# Patient Record
Sex: Female | Born: 1950 | Race: Black or African American | Hispanic: No | Smoking: Current every day smoker
Health system: Southern US, Community
[De-identification: ages and names within clinical notes are randomized; demographics above are authoritative.]

## PROBLEM LIST (undated history)

## (undated) DIAGNOSIS — Z951 Presence of aortocoronary bypass graft: Secondary | ICD-10-CM

## (undated) DIAGNOSIS — I213 ST elevation (STEMI) myocardial infarction of unspecified site: Secondary | ICD-10-CM

## (undated) DIAGNOSIS — Z720 Tobacco use: Secondary | ICD-10-CM

## (undated) DIAGNOSIS — E785 Hyperlipidemia, unspecified: Secondary | ICD-10-CM

## (undated) DIAGNOSIS — R569 Unspecified convulsions: Secondary | ICD-10-CM

## (undated) DIAGNOSIS — I10 Essential (primary) hypertension: Secondary | ICD-10-CM

## (undated) DIAGNOSIS — I639 Cerebral infarction, unspecified: Secondary | ICD-10-CM

## (undated) DIAGNOSIS — I63512 Cerebral infarction due to unspecified occlusion or stenosis of left middle cerebral artery: Secondary | ICD-10-CM

## (undated) HISTORY — PX: CARDIAC CATHETERIZATION: SHX172

## (undated) HISTORY — PX: TUBAL LIGATION: SHX77

## (undated) HISTORY — PX: DILATION AND CURETTAGE OF UTERUS: SHX78

## (undated) HISTORY — PX: EXCISION OF ABDOMINAL WALL TUMOR: SHX6687

---

## 2009-05-15 ENCOUNTER — Emergency Department: Payer: Self-pay | Admitting: Unknown Physician Specialty

## 2009-11-16 ENCOUNTER — Emergency Department: Payer: Self-pay | Admitting: Emergency Medicine

## 2010-01-12 ENCOUNTER — Emergency Department: Payer: Self-pay | Admitting: Emergency Medicine

## 2010-08-23 ENCOUNTER — Observation Stay: Payer: Self-pay | Admitting: Internal Medicine

## 2012-07-21 ENCOUNTER — Encounter (HOSPITAL_COMMUNITY)
Admission: EM | Disposition: A | Discharge status: Home Health | Payer: Self-pay | Source: Home / Self Care | Attending: Cardiovascular Disease

## 2012-07-21 ENCOUNTER — Inpatient Hospital Stay (HOSPITAL_COMMUNITY)
Admission: EM | Admit: 2012-07-21 | Discharge: 2012-07-31 | DRG: 234 | Disposition: A | Discharge status: Home Health | Payer: Medicaid Other | Attending: Cardiovascular Disease | Admitting: Cardiovascular Disease

## 2012-07-21 ENCOUNTER — Ambulatory Visit (HOSPITAL_COMMUNITY): Admit: 2012-07-21 | Payer: Self-pay | Admitting: Cardiovascular Disease

## 2012-07-21 ENCOUNTER — Encounter (HOSPITAL_COMMUNITY): Payer: Self-pay | Admitting: Emergency Medicine

## 2012-07-21 DIAGNOSIS — E785 Hyperlipidemia, unspecified: Secondary | ICD-10-CM | POA: Diagnosis present

## 2012-07-21 DIAGNOSIS — I219 Acute myocardial infarction, unspecified: Secondary | ICD-10-CM

## 2012-07-21 DIAGNOSIS — I10 Essential (primary) hypertension: Secondary | ICD-10-CM | POA: Diagnosis present

## 2012-07-21 DIAGNOSIS — I2510 Atherosclerotic heart disease of native coronary artery without angina pectoris: Secondary | ICD-10-CM

## 2012-07-21 DIAGNOSIS — I213 ST elevation (STEMI) myocardial infarction of unspecified site: Secondary | ICD-10-CM

## 2012-07-21 DIAGNOSIS — Z79899 Other long term (current) drug therapy: Secondary | ICD-10-CM

## 2012-07-21 DIAGNOSIS — J9819 Other pulmonary collapse: Secondary | ICD-10-CM | POA: Diagnosis not present

## 2012-07-21 DIAGNOSIS — Z951 Presence of aortocoronary bypass graft: Secondary | ICD-10-CM

## 2012-07-21 DIAGNOSIS — F17200 Nicotine dependence, unspecified, uncomplicated: Secondary | ICD-10-CM | POA: Diagnosis present

## 2012-07-21 DIAGNOSIS — I739 Peripheral vascular disease, unspecified: Secondary | ICD-10-CM | POA: Diagnosis present

## 2012-07-21 DIAGNOSIS — D696 Thrombocytopenia, unspecified: Secondary | ICD-10-CM | POA: Diagnosis not present

## 2012-07-21 DIAGNOSIS — E876 Hypokalemia: Secondary | ICD-10-CM | POA: Diagnosis present

## 2012-07-21 DIAGNOSIS — M810 Age-related osteoporosis without current pathological fracture: Secondary | ICD-10-CM | POA: Diagnosis present

## 2012-07-21 DIAGNOSIS — E8779 Other fluid overload: Secondary | ICD-10-CM | POA: Diagnosis not present

## 2012-07-21 DIAGNOSIS — I959 Hypotension, unspecified: Secondary | ICD-10-CM | POA: Diagnosis not present

## 2012-07-21 DIAGNOSIS — Z8673 Personal history of transient ischemic attack (TIA), and cerebral infarction without residual deficits: Secondary | ICD-10-CM

## 2012-07-21 DIAGNOSIS — Z8249 Family history of ischemic heart disease and other diseases of the circulatory system: Secondary | ICD-10-CM

## 2012-07-21 DIAGNOSIS — D62 Acute posthemorrhagic anemia: Secondary | ICD-10-CM | POA: Diagnosis not present

## 2012-07-21 DIAGNOSIS — Z720 Tobacco use: Secondary | ICD-10-CM | POA: Diagnosis present

## 2012-07-21 DIAGNOSIS — J90 Pleural effusion, not elsewhere classified: Secondary | ICD-10-CM | POA: Diagnosis not present

## 2012-07-21 HISTORY — DX: Presence of aortocoronary bypass graft: Z95.1

## 2012-07-21 HISTORY — DX: ST elevation (STEMI) myocardial infarction of unspecified site: I21.3

## 2012-07-21 HISTORY — DX: Essential (primary) hypertension: I10

## 2012-07-21 HISTORY — DX: Tobacco use: Z72.0

## 2012-07-21 HISTORY — DX: Hyperlipidemia, unspecified: E78.5

## 2012-07-21 HISTORY — PX: LEFT HEART CATHETERIZATION WITH CORONARY ANGIOGRAM: SHX5451

## 2012-07-21 LAB — BASIC METABOLIC PANEL
Chloride: 101 mEq/L (ref 96–112)
GFR calc Af Amer: 81 mL/min — ABNORMAL LOW (ref >90)
GFR calc non Af Amer: 70 mL/min — ABNORMAL LOW (ref >90)
Potassium: 3.1 mEq/L — ABNORMAL LOW (ref 3.5–5.1)
Sodium: 139 mEq/L (ref 135–145)

## 2012-07-21 LAB — DIFFERENTIAL
Lymphocytes Relative: 34 % (ref 12–46)
Lymphs Abs: 2.8 10*3/uL (ref 0.7–4.0)
Monocytes Relative: 7 % (ref 3–12)
Neutrophils Relative %: 57 % (ref 43–77)

## 2012-07-21 LAB — POCT I-STAT, CHEM 8
BUN: 14 mg/dL (ref 6–23)
Calcium, Ion: 1.24 mmol/L (ref 1.13–1.30)
Chloride: 103 mEq/L (ref 96–112)
HCT: 43 % (ref 36.0–46.0)
Potassium: 3.1 mEq/L — ABNORMAL LOW (ref 3.5–5.1)
Sodium: 143 mEq/L (ref 135–145)

## 2012-07-21 LAB — CBC
Hemoglobin: 14.3 g/dL (ref 12.0–15.0)
MCV: 93.9 fL (ref 78.0–100.0)
Platelets: 195 10*3/uL (ref 150–400)
RBC: 4.26 MIL/uL (ref 3.87–5.11)
WBC: 8.2 10*3/uL (ref 4.0–10.5)

## 2012-07-21 LAB — PROTIME-INR
INR: 0.96 (ref 0.00–1.49)
Prothrombin Time: 12.7 seconds (ref 11.6–15.2)

## 2012-07-21 LAB — POCT I-STAT TROPONIN I: Troponin i, poc: 0.01 ng/mL (ref 0.00–0.08)

## 2012-07-21 SURGERY — LEFT HEART CATHETERIZATION WITH CORONARY ANGIOGRAM
Anesthesia: LOCAL

## 2012-07-21 MED ORDER — ACETAMINOPHEN 325 MG PO TABS: 650.0000 mg | ORAL_TABLET | ORAL | Status: DC | PRN

## 2012-07-21 MED ORDER — LIDOCAINE HCL (PF) 1 % IJ SOLN
INTRAMUSCULAR | Status: AC
  Filled 2012-07-21: qty 30

## 2012-07-21 MED ORDER — HEPARIN BOLUS VIA INFUSION: 3000.0000 [IU] | Freq: Once | INTRAVENOUS | Status: AC

## 2012-07-21 MED ORDER — ONDANSETRON HCL 4 MG/2ML IJ SOLN: 4.0000 mg | Freq: Four times a day (QID) | INTRAMUSCULAR | Status: DC | PRN

## 2012-07-21 MED ORDER — NITROGLYCERIN IN D5W 200-5 MCG/ML-% IV SOLN
3.0000 ug/min | INTRAVENOUS | Status: DC
  Administered 2012-07-21: 10 ug/min via INTRAVENOUS

## 2012-07-21 MED ORDER — ASPIRIN 81 MG PO CHEW
81.0000 mg | CHEWABLE_TABLET | Freq: Every day | ORAL | Status: DC
  Administered 2012-07-22 – 2012-07-25 (×4): 81 mg via ORAL
  Filled 2012-07-21 (×4): qty 1

## 2012-07-21 MED ORDER — ATORVASTATIN CALCIUM 80 MG PO TABS
80.0000 mg | ORAL_TABLET | Freq: Every day | ORAL | Status: DC
  Administered 2012-07-21 – 2012-07-30 (×9): 80 mg via ORAL
  Filled 2012-07-21 (×11): qty 1

## 2012-07-21 MED ORDER — ASPIRIN EC 81 MG PO TBEC: 81.0000 mg | DELAYED_RELEASE_TABLET | Freq: Every day | ORAL | Status: DC

## 2012-07-21 MED ORDER — HEPARIN (PORCINE) IN NACL 2-0.9 UNIT/ML-% IJ SOLN
INTRAMUSCULAR | Status: AC
  Filled 2012-07-21: qty 1000

## 2012-07-21 MED ORDER — NITROGLYCERIN IN D5W 200-5 MCG/ML-% IV SOLN
INTRAVENOUS | Status: AC
  Filled 2012-07-21: qty 250

## 2012-07-21 MED ORDER — METOPROLOL TARTRATE 25 MG/10 ML ORAL SUSPENSION
6.2500 mg | Freq: Two times a day (BID) | ORAL | Status: DC
  Administered 2012-07-21: 6.25 mg via ORAL
  Filled 2012-07-21: qty 2.5

## 2012-07-21 MED ORDER — HYDRALAZINE HCL 20 MG/ML IJ SOLN
INTRAMUSCULAR | Status: AC
  Administered 2012-07-21: 10 mg via INTRAVENOUS
  Filled 2012-07-21: qty 1

## 2012-07-21 MED ORDER — METOPROLOL TARTRATE 1 MG/ML IV SOLN
INTRAVENOUS | Status: AC
  Filled 2012-07-21: qty 5

## 2012-07-21 MED ORDER — MIDAZOLAM HCL 2 MG/2ML IJ SOLN
INTRAMUSCULAR | Status: AC
  Filled 2012-07-21: qty 2

## 2012-07-21 MED ORDER — SODIUM CHLORIDE 0.9 % IV SOLN
INTRAVENOUS | Status: DC
  Administered 2012-07-21: 23:00:00 via INTRAVENOUS

## 2012-07-21 MED ORDER — HEPARIN SODIUM (PORCINE) 5000 UNIT/ML IJ SOLN
4000.0000 [IU] | Freq: Once | INTRAMUSCULAR | Status: AC
  Administered 2012-07-21: 3000 [IU] via INTRAVENOUS
  Filled 2012-07-21: qty 1

## 2012-07-21 MED ORDER — ACETAMINOPHEN 325 MG PO TABS
650.0000 mg | ORAL_TABLET | ORAL | Status: DC | PRN
  Administered 2012-07-24: 650 mg via ORAL
  Filled 2012-07-21: qty 2

## 2012-07-21 MED ORDER — EPTIFIBATIDE 75 MG/100ML IV SOLN
1.0000 ug/kg/min | INTRAVENOUS | Status: AC
  Administered 2012-07-22: 1 ug/kg/min via INTRAVENOUS
  Filled 2012-07-21 (×2): qty 100

## 2012-07-21 MED ORDER — CLONIDINE HCL 0.1 MG PO TABS
0.1000 mg | ORAL_TABLET | Freq: Once | ORAL | Status: AC
  Administered 2012-07-22: 0.1 mg via ORAL
  Filled 2012-07-21: qty 1

## 2012-07-21 MED ORDER — FENTANYL CITRATE 0.05 MG/ML IJ SOLN
INTRAMUSCULAR | Status: AC
  Filled 2012-07-21: qty 2

## 2012-07-21 MED ORDER — EPTIFIBATIDE BOLUS VIA INFUSION
180.0000 ug/kg | Freq: Once | INTRAVENOUS | Status: DC
  Filled 2012-07-21: qty 14

## 2012-07-21 MED ORDER — HYDRALAZINE HCL 20 MG/ML IJ SOLN: 10.0000 mg | Freq: Once | INTRAMUSCULAR | Status: AC

## 2012-07-21 MED ORDER — PNEUMOCOCCAL VAC POLYVALENT 25 MCG/0.5ML IJ INJ
0.5000 mL | INJECTION | INTRAMUSCULAR | Status: AC
  Administered 2012-07-22: 0.5 mL via INTRAMUSCULAR
  Filled 2012-07-21: qty 0.5

## 2012-07-21 MED ORDER — NITROGLYCERIN 0.2 MG/ML ON CALL CATH LAB
INTRAVENOUS | Status: AC
  Filled 2012-07-21: qty 1

## 2012-07-21 MED ORDER — ALPRAZOLAM 0.25 MG PO TABS
0.2500 mg | ORAL_TABLET | Freq: Once | ORAL | Status: AC
  Administered 2012-07-21: 0.25 mg via ORAL
  Filled 2012-07-21: qty 1

## 2012-07-21 MED ORDER — NITROGLYCERIN 0.4 MG SL SUBL: 0.4000 mg | SUBLINGUAL_TABLET | SUBLINGUAL | Status: DC | PRN

## 2012-07-21 MED ORDER — HEPARIN (PORCINE) IN NACL 100-0.45 UNIT/ML-% IJ SOLN
800.0000 [IU]/h | INTRAMUSCULAR | Status: DC
  Filled 2012-07-21: qty 250

## 2012-07-21 NOTE — ED Provider Notes (Signed)
History     CSN: [id number]  Arrival date & time 07/21/12  2046   First MD Initiated Contact with Patient 07/21/12 2055      Chief Complaint  Patient presents with  . Code STEMI    (Consider location/radiation/quality/duration/timing/severity/associated sxs/prior treatment) The history is provided by the patient and the EMS personnel.  pt here with substernal chest pain that began this pm--h/o same yesterday a/w exertion that resolved spontaneously--sx began again this pm when she ws cleaning--pt has sob and dizziness with this, ems called and pt with inf wall STEMI, nitro given and pt transported here--pt with 5/10 chest pain now  No past medical history on file.  No past surgical history on file.  No family history on file.  History  Substance Use Topics  . Smoking status: Not on file  . Smokeless tobacco: Not on file  . Alcohol Use: Not on file    OB History   No data available      Review of Systems  All other systems reviewed and are negative.    Allergies  Review of patient's allergies indicates not on file.  Home Medications  No current outpatient prescriptions on file.  There were no vitals taken for this visit.  Physical Exam  Nursing note and vitals reviewed. Constitutional: She is oriented to person, place, and time. She appears well-developed and well-nourished.  Non-toxic appearance. No distress.  HENT:  Head: Normocephalic and atraumatic.  Eyes: Conjunctivae, EOM and lids are normal. Pupils are equal, round, and reactive to light.  Neck: Normal range of motion. Neck supple. No tracheal deviation present. No mass present.  Cardiovascular: Normal rate, regular rhythm and normal heart sounds.  Exam reveals no gallop.   No murmur heard. Pulmonary/Chest: Effort normal and breath sounds normal. No stridor. No respiratory distress. She has no decreased breath sounds. She has no wheezes. She has no rhonchi. She has no rales.  Abdominal: Soft. Normal  appearance and bowel sounds are normal. She exhibits no distension. There is no tenderness. There is no rebound and no CVA tenderness.  Musculoskeletal: Normal range of motion. She exhibits no edema and no tenderness.  Neurological: She is alert and oriented to person, place, and time. She has normal strength. No cranial nerve deficit or sensory deficit. GCS eye subscore is 4. GCS verbal subscore is 5. GCS motor subscore is 6.  Skin: Skin is warm and dry. No abrasion and no rash noted.  Psychiatric: She has a normal mood and affect. Her speech is normal and behavior is normal.    ED Course  Procedures (including critical care time)  Labs Reviewed - No data to display No results found.   No diagnosis found.    MDM   Date: 07/21/2012  Rate: 70  Rhythm: normal sinus rhythm  QRS Axis: normal  Intervals: normal  ST/T Wave abnormalities: ST elevations inferiorly  Conduction Disutrbances:none  Narrative Interpretation:   Old EKG Reviewed: none available  Cards eval pt on arrival, heparing ordered along with morphine, she will go to cath lab         Toy Baker, MD 07/21/12 2101

## 2012-07-21 NOTE — H&P (Signed)
[name] [name] is an 62 y.o. female.   Chief Complaint: chest tightness/burning HPI: 62 yo woman with history of tobacco use who has been having exertional chest pain on/off for the last 2 days. With house chores, cleaning and moving around she noted pain that would improve with rest. Given continued pain EMS ultimately called and ECG suggested inferior STEMI with ST elevation and reciprocal changes in I, aVL, V2, V3. Ambulance activated cath lab/code STEMI who notified me. On arrival, she was having 5/10 chest pain, more burning in nature, mild improvement with SL NTG. bp 130s/70s in ambulance with drop to 110s, 60s with one SL NTG. HR in the 50s-60s. She received 4 baby aspirin and then 3000 units in the ER. She has some mild associated SOB, mild lightheadedness. Otherwise, no bleeding issues, dark stools and she states no problems with seeing a physician or taking medications regularly. We already discussed smoking cessation which she is amenable to doing.     Past Medical History  Diagnosis Date  . Hypertension     History reviewed. No pertinent past surgical history. No known family history of CAD No family history on file. Social History:  reports that she has been smoking.  She does not have any smokeless tobacco history on file. Her alcohol and drug histories are not on file.  Allergies: No Active Allergies   (Not in a hospital admission)  Results for orders placed during the hospital encounter of 07/21/12 (from the past 48 hour(s))  CBC     Status: None   Collection Time    07/21/12  8:57 PM      Result Value Range   WBC 8.2  4.0 - 10.5 K/uL   RBC 4.26  3.87 - 5.11 MIL/uL   Hemoglobin 14.3  12.0 - 15.0 g/dL   HCT 16.1  09.6 - 04.5 %   MCV 93.9  78.0 - 100.0 fL   MCH 33.6  26.0 - 34.0 pg   MCHC 35.8  30.0 - 36.0 g/dL   RDW 40.9  81.1 - 91.4 %   Platelets 195  150 - 400 K/uL  DIFFERENTIAL     Status: None   Collection Time    07/21/12  8:57 PM      Result Value Range   Neutrophils Relative % 57  43 - 77 %   Neutro Abs 4.7  1.7 - 7.7 K/uL   Lymphocytes Relative 34  12 - 46 %   Lymphs Abs 2.8  0.7 - 4.0 K/uL   Monocytes Relative 7  3 - 12 %   Monocytes Absolute 0.6  0.1 - 1.0 K/uL   Eosinophils Relative 2  0 - 5 %   Eosinophils Absolute 0.1  0.0 - 0.7 K/uL   Basophils Relative 1  0 - 1 %   Basophils Absolute 0.0  0.0 - 0.1 K/uL   No results found.  Review of Systems  Constitutional: Negative for fever, chills, weight loss and malaise/fatigue.  HENT: Negative for hearing loss, ear pain, neck pain and ear discharge.   Eyes: Negative for double vision, photophobia and pain.  Respiratory: Positive for shortness of breath. Negative for cough, hemoptysis and sputum production.   Cardiovascular: Positive for chest pain. Negative for orthopnea, claudication and leg swelling.  Gastrointestinal: Positive for heartburn. Negative for nausea, vomiting, abdominal pain, diarrhea, blood in stool and melena.  Genitourinary: Negative for dysuria, urgency and frequency.  Musculoskeletal: Negative for myalgias and back pain.  Neurological: Positive for dizziness.  Negative for tremors, sensory change and headaches.  Endo/Heme/Allergies: Negative for polydipsia. Does not bruise/bleed easily.  Psychiatric/Behavioral: Negative for depression, suicidal ideas and substance abuse.    Blood pressure 174/88, pulse 69, temperature 98.2 F (36.8 C), temperature source Oral, resp. rate 18, height 5\' 1"  (1.549 m), weight 56.7 kg (125 lb), SpO2 100.00%. Physical Exam  Nursing note and vitals reviewed. Constitutional: She is oriented to person, place, and time. She appears well-developed and well-nourished. She appears distressed.  Thin woman, appears stated age in mild distress with some ongoing burning chest symptoms/pain  HENT:  Head: Normocephalic and atraumatic.  Nose: Nose normal.  Mouth/Throat: Oropharynx is clear and moist. No oropharyngeal exudate.  Eyes: Conjunctivae and  EOM are normal. Pupils are equal, round, and reactive to light. Left eye exhibits no discharge. No scleral icterus.  Neck: Normal range of motion. Neck supple. No JVD present. No tracheal deviation present.  Cardiovascular: Normal rate, regular rhythm, normal heart sounds and intact distal pulses.  Exam reveals no gallop.   No murmur heard. Respiratory: Effort normal. No respiratory distress. She has no wheezes. She has rales.  GI: Soft. Bowel sounds are normal. She exhibits no distension. There is no tenderness. There is no rebound.  Musculoskeletal: Normal range of motion. She exhibits no edema and no tenderness.  Neurological: She is alert and oriented to person, place, and time. No cranial nerve deficit. Coordination normal.  Skin: Skin is warm and dry. No rash noted. No erythema.  Psychiatric: She has a normal mood and affect. Her behavior is normal. Thought content normal.   Labs pending at time of note ECG reviewed; inferior ST elevation, some qs, reciprocal ST depression in I, aVL, V2, V3 --> K noted to be 3.1 Problem List STEMI Chest Pain Tobacco abuse Hypertension Presumed dyslipidemia Hypokalemia Severe 3v CAD Assessment/Plan 62 yo woman with PMH of HTN tobacco abuse who has been having intermittent chest pain over the last 36-48 hours associated with exertion diagnosed with STEMI on ECG. LHC with severe 3v CAD - mLAD, D1, OM1, total LCx, 90% mRCA. Dr. Allyson Sabal called CT Surgery. Will plan to use IIB/IIIA, aspirin, betablocker while awaiting potential CABG. I discussed the findings with patient and her family.  - lipid panel, TSH, atorvastatin given - NPO  - hba1c, BNP - aspirin, bb - clonidine as it is home bp medication - replete potassium 40 meq PO, 40 meq IV KCl   [name] [name] 07/21/2012, 9:18 PM

## 2012-07-21 NOTE — CV Procedure (Signed)
[name] [name] is a 62 y.o. female    [id number] LOCATION:  FACILITY: [facility]  PHYSICIAN: Nanetta Batty, M.D. [birth year]   DATE OF PROCEDURE:  07/21/2012  DATE OF DISCHARGE:   CARDIAC CATHETERIZATION     History obtained from chart review.[name] [name] is a 62 year old thin-appearing tumor in female with no prior cardiac history who apparently has been having chest pain off and on for the last 2 days. She is brought in by EMS with chest pain and EKG the suggested an inferior ST EMI. She was brought emergently to the cath lab for cardiac catheterization and potential urgent revascularization.   PROCEDURE DESCRIPTION:    The patient was brought to the second floor [place] [facility] cath lab in the postabsorptive state. She was not premedicated .Marland Kitchen Her right groinwas prepped and shaved in usual sterile fashion. Xylocaine 1% was used for local anesthesia. A 5 French sheath was inserted into the right common femoral artery using standard Seldinger technique. The patient received  3000 units  of heparin  intravenously.  5 French right and left Judkins diagnostic catheters along with a 5 French pigtail catheter were used for selective coronary angiography and left ventriculography respectively. Visipaque dye was used for the entirety of the case. Retrograde aortic, left ventricular and pullback pressures were recorded. A total of 130 cc of contrast was administered in the patient.    HEMODYNAMICS:    AO SYSTOLIC/AO DIASTOLIC: 171/87   LV SYSTOLIC/LV DIASTOLIC: 182/35  ANGIOGRAPHIC RESULTS:   1. Left main; 30% distal  2. LAD; 60-70% segmental mid followed by 90% fairly focal just beyond this. The first diagonal branch was moderate in size and had an 80-90% proximal stenosis 3. Left circumflex; is a nondominant vessel is small in caliber. It was totally occluded in its midportion and this appeared to be chronic. Gave off a moderate-sized first marginal branch. There was a 90% AV groove circumflex  stenosis in the proximal third just before the takeoff of the marginal branch..  4. Right coronary artery; dominant, postop Stapley calcified with 95% stenosis on the first bend, and 90% stenosis on the distal bend but before the Genu , 70% stenosis at the crux, 95% ostial PDA stenosis. 5.LIMA with the subselectively visualized and widely patent. It was suitable for use during coronary artery bypass grafting if necessary 6. Left ventriculography; RAO left ventriculogram was performed using  25 mL of Visipaque dye at 12 mL/second. The overall LVEF estimated  45-50 %  With wall motion abnormalities notable for moderate to severe inferobasal hypokinesia and moderate posterolateral hypokinesia  IMPRESSION:[name] [name]  has three-vessel disease with mild LV dysfunction. Her circumflex is a small nondominant vessel and is occluded in its midportion. This appears chronic. She does have LAD and diagonal branch disease. I believe her culprit vessel is her dominant RCA which is patent with TIMI-3 flow. This is very tortuous and calcified and not ideal for percutaneous intervention. The patient is pain-free and hemodynamically stable. I believe the best therapy would be coronary artery bypass grafting for complete revascularization. Patient will be treated with  Aspirin,  beta blocker and IIb IIIa inhibitor (Integrillin). She has not been given oral antiplatelet medications. Dr. Andrey Spearman from [facility] as has been notified.  [name] MD, [facility] 07/21/2012 9:56 PM

## 2012-07-21 NOTE — Progress Notes (Signed)
ANTICOAGULATION CONSULT NOTE - Initial Consult  Pharmacy Consult for Heparin Indication: chest pain/ACS  No Active Allergies  Patient Measurements: Height: 5\' 1"  (154.9 cm) Weight: 125 lb (56.7 kg) IBW/kg (Calculated) : 47.8 Heparin Dosing Weight:   Vital Signs: Temp: 98.2 F (36.8 C) (06/20 2050) Temp src: Oral (06/20 2050) BP: 174/88 mmHg (06/20 2050) Pulse Rate: 69 (06/20 2050)  Labs: No results found for this basename: HGB, HCT, PLT, APTT, LABPROT, INR, HEPARINUNFRC, CREATININE, CKTOTAL, CKMB, TROPONINI,  in the last 72 hours  CrCl is unknown because no creatinine reading has been taken.   Medical History: No past medical history on file.  Medications:  Scheduled:    Assessment: Not much information available. Pt arrived as a Code STEMI.  We were asked to dose heparin before pt went to cath lab. Goal of Therapy:  Heparin level 0.3-0.7 units/ml Monitor platelets by anticoagulation protocol: Yes   Plan:  Heparin bolus 3000 Units Heparin drip 800 units/hr Will f/u after cath.  Eugene Garnet 07/21/2012,9:08 PM

## 2012-07-21 NOTE — ED Notes (Signed)
Pt to cath lab with Alwyn Pea, RN

## 2012-07-21 NOTE — H&P (Signed)
    Pt was reexamined and existing H & P reviewed. No changes found.  [name], MD [facility] 07/21/2012 9:48 PM

## 2012-07-21 NOTE — ED Notes (Signed)
Pt to ED via [facility]> Code Stemi> Pt was working inside cleaning house and became hot.  Pt reported near syncopal episode.  Reports tightness to R side of chest that radiates to R side of neck.  Denies sob, nausea, and vomiting.  EMS administered 4 baby ASA and NTG SL x 1. Initial BP 130/- and after NTG 109/-. Dr. Freida Busman (EDP) and Dr. Tresa Endo (cardiologist) at bedside on arrival.  Pt states she had same pain yesterday and reports it as feeling like heartburn.  Pain 5/10

## 2012-07-22 DIAGNOSIS — I219 Acute myocardial infarction, unspecified: Secondary | ICD-10-CM

## 2012-07-22 DIAGNOSIS — I2510 Atherosclerotic heart disease of native coronary artery without angina pectoris: Secondary | ICD-10-CM

## 2012-07-22 LAB — COMPREHENSIVE METABOLIC PANEL
ALT: 9 U/L (ref 0–35)
AST: 19 U/L (ref 0–37)
Albumin: 3.2 g/dL — ABNORMAL LOW (ref 3.5–5.2)
Alkaline Phosphatase: 65 U/L (ref 39–117)
BUN: 13 mg/dL (ref 6–23)
CO2: 23 mEq/L (ref 19–32)
Calcium: 9 mg/dL (ref 8.4–10.5)
Chloride: 103 mEq/L (ref 96–112)
Creatinine, Ser: 0.79 mg/dL (ref 0.50–1.10)
GFR calc Af Amer: >90 mL/min (ref >90)
GFR calc non Af Amer: 86 mL/min — ABNORMAL LOW (ref >90)
Glucose, Bld: 129 mg/dL — ABNORMAL HIGH (ref 70–99)
Potassium: 3.1 mEq/L — ABNORMAL LOW (ref 3.5–5.1)
Sodium: 138 mEq/L (ref 135–145)
Total Bilirubin: 0.3 mg/dL (ref 0.3–1.2)
Total Protein: 6.6 g/dL (ref 6.0–8.3)

## 2012-07-22 LAB — CBC WITH DIFFERENTIAL/PLATELET
Basophils Absolute: 0 10*3/uL (ref 0.0–0.1)
Basophils Relative: 0 % (ref 0–1)
Eosinophils Absolute: 0.1 10*3/uL (ref 0.0–0.7)
Eosinophils Relative: 1 % (ref 0–5)
HCT: 39.3 % (ref 36.0–46.0)
Hemoglobin: 13.9 g/dL (ref 12.0–15.0)
Lymphocytes Relative: 16 % (ref 12–46)
Lymphs Abs: 1.4 10*3/uL (ref 0.7–4.0)
MCH: 32.6 pg (ref 26.0–34.0)
MCHC: 35.4 g/dL (ref 30.0–36.0)
MCV: 92.3 fL (ref 78.0–100.0)
Monocytes Absolute: 0.5 10*3/uL (ref 0.1–1.0)
Monocytes Relative: 5 % (ref 3–12)
Neutro Abs: 6.9 10*3/uL (ref 1.7–7.7)
Neutrophils Relative %: 78 % — ABNORMAL HIGH (ref 43–77)
Platelets: 217 10*3/uL (ref 150–400)
RBC: 4.26 MIL/uL (ref 3.87–5.11)
RDW: 14 % (ref 11.5–15.5)
WBC: 8.8 10*3/uL (ref 4.0–10.5)

## 2012-07-22 LAB — PROTIME-INR
INR: 0.98 (ref 0.00–1.49)
Prothrombin Time: 12.9 seconds (ref 11.6–15.2)

## 2012-07-22 LAB — TROPONIN I
Troponin I: 1.51 ng/mL (ref <0.30)
Troponin I: 6.79 ng/mL (ref <0.30)

## 2012-07-22 LAB — CBC
HCT: 35.2 % — ABNORMAL LOW (ref 36.0–46.0)
Hemoglobin: 12.2 g/dL (ref 12.0–15.0)
MCH: 32.4 pg (ref 26.0–34.0)
MCHC: 34.7 g/dL (ref 30.0–36.0)
MCV: 93.4 fL (ref 78.0–100.0)
Platelets: 169 10*3/uL (ref 150–400)
RBC: 3.77 MIL/uL — ABNORMAL LOW (ref 3.87–5.11)
RDW: 14.3 % (ref 11.5–15.5)
WBC: 6.5 10*3/uL (ref 4.0–10.5)

## 2012-07-22 LAB — BASIC METABOLIC PANEL
BUN: 11 mg/dL (ref 6–23)
CO2: 22 mEq/L (ref 19–32)
Calcium: 8.7 mg/dL (ref 8.4–10.5)
Chloride: 109 mEq/L (ref 96–112)
Creatinine, Ser: 0.71 mg/dL (ref 0.50–1.10)
GFR calc Af Amer: >90 mL/min (ref >90)
GFR calc non Af Amer: 89 mL/min — ABNORMAL LOW (ref >90)
Glucose, Bld: 98 mg/dL (ref 70–99)
Potassium: 4.1 mEq/L (ref 3.5–5.1)
Sodium: 140 mEq/L (ref 135–145)

## 2012-07-22 LAB — MRSA PCR SCREENING: MRSA by PCR: NEGATIVE

## 2012-07-22 LAB — LIPID PANEL
Cholesterol: 187 mg/dL (ref 0–200)
HDL: 50 mg/dL (ref >39)
LDL Cholesterol: 120 mg/dL — ABNORMAL HIGH (ref 0–99)
Total CHOL/HDL Ratio: 3.7 RATIO
Triglycerides: 87 mg/dL (ref <150)
VLDL: 17 mg/dL (ref 0–40)

## 2012-07-22 MED ORDER — POTASSIUM CHLORIDE 10 MEQ/100ML IV SOLN
10.0000 meq | INTRAVENOUS | Status: AC
  Administered 2012-07-22 (×4): 10 meq via INTRAVENOUS
  Filled 2012-07-22: qty 400

## 2012-07-22 MED ORDER — SODIUM CHLORIDE 0.9 % IV SOLN: INTRAVENOUS | Status: DC

## 2012-07-22 MED ORDER — SODIUM CHLORIDE 0.9 % IV SOLN: INTRAVENOUS | Status: AC

## 2012-07-22 MED ORDER — ZOLPIDEM TARTRATE 5 MG PO TABS
5.0000 mg | ORAL_TABLET | Freq: Every evening | ORAL | Status: DC | PRN
  Administered 2012-07-22: 5 mg via ORAL
  Filled 2012-07-22: qty 1

## 2012-07-22 MED ORDER — POTASSIUM CHLORIDE CRYS ER 20 MEQ PO TBCR
40.0000 meq | EXTENDED_RELEASE_TABLET | Freq: Once | ORAL | Status: AC
  Administered 2012-07-22: 40 meq via ORAL
  Filled 2012-07-22 (×2): qty 1

## 2012-07-22 MED ORDER — CLONIDINE HCL 0.1 MG PO TABS
0.1000 mg | ORAL_TABLET | Freq: Two times a day (BID) | ORAL | Status: DC
  Administered 2012-07-22 – 2012-07-25 (×7): 0.1 mg via ORAL
  Filled 2012-07-22 (×8): qty 1

## 2012-07-22 MED ORDER — HEPARIN (PORCINE) IN NACL 100-0.45 UNIT/ML-% IJ SOLN
800.0000 [IU]/h | INTRAMUSCULAR | Status: DC
  Administered 2012-07-22: 650 [IU]/h via INTRAVENOUS
  Administered 2012-07-23 – 2012-07-25 (×2): 800 [IU]/h via INTRAVENOUS
  Filled 2012-07-22 (×5): qty 250

## 2012-07-22 MED ORDER — METOPROLOL TARTRATE 12.5 MG HALF TABLET
12.5000 mg | ORAL_TABLET | Freq: Two times a day (BID) | ORAL | Status: DC
  Administered 2012-07-22 (×2): 12.5 mg via ORAL
  Filled 2012-07-22 (×4): qty 1

## 2012-07-22 MED ORDER — ATROPINE SULFATE 1 MG/ML IJ SOLN
INTRAMUSCULAR | Status: AC
  Filled 2012-07-22: qty 1

## 2012-07-22 NOTE — Progress Notes (Signed)
CARDIAC REHAB PHASE I   PRE:  Rate/Rhythm: 70 sinus rhythm  BP:  Supine:   Sitting: 154/84  Standing:    SaO2: 99% ra  MODE:  Ambulation: 300 ft   POST:  Rate/Rhythem:   BP:  Supine:  Sitting:  152/83 Standing:    SaO2: 97% ra  1215-1255 Pt ambulated in hallway without difficulty x 1 assist. Asymptomatic.  Pt to chair, call light in reach.   Pre op education started.  Pt verbalized understanding  [name] [name], [employer]

## 2012-07-22 NOTE — Progress Notes (Signed)
Femoral Sheath Removal  Rt groin femoral sheath, level 0 pedal pulse 1+ prior to removal. Manual pressure held x11mins, VSS throughout pull, pt tolerated well. No complications. Pressure dressing applied to site. Post removal Rt groin level 0, VSS, pedal pulse 1+. Post instructions given to patient and family at bedside.

## 2012-07-22 NOTE — Progress Notes (Signed)
ANTICOAGULATION CONSULT NOTE - Follow-up Consult  Pharmacy Consult for Integrilin Indication: chest pain/ACS; CAD  No Known Allergies  Patient Measurements: Height: 5\' 1"  (154.9 cm) Weight: 111 lb 12.4 oz (50.7 kg) IBW/kg (Calculated) : 47.8 Heparin Dosing Weight:   Vital Signs: Temp: 98.4 F (36.9 C) (06/21 0807) Temp src: Oral (06/21 0807) BP: 153/99 mmHg (06/21 0807) Pulse Rate: 65 (06/21 0807)  Labs:  Recent Labs  07/21/12 2057 07/21/12 2125 07/21/12 2337 07/22/12 0730  HGB 14.3 14.6 13.9 12.2  HCT 40.0 43.0 39.3 35.2*  PLT 195  --  217 169  APTT 26  --  64*  --   LABPROT 12.7  --  12.9  --   INR 0.96  --  0.98  --   CREATININE 0.86 1.10 0.79 0.71  TROPONINI  --   --  1.51* 6.80*    Estimated Creatinine Clearance: 53.6 ml/min (by C-G formula based on Cr of 0.71).   Medical History: Past Medical History  Diagnosis Date  . Hypertension     Medications:  Scheduled:  . aspirin  81 mg Oral Daily  . atorvastatin  80 mg Oral q1800  . cloNIDine  0.1 mg Oral BID  . eptifibatide  180 mcg/kg Intravenous Once  . metoprolol tartrate  12.5 mg Oral BID  . pneumococcal 23 valent vaccine  0.5 mL Intramuscular Tomorrow-1000   . eptifibatide 1 mcg/kg/min (07/22/12 0435)  . nitroGLYCERIN 20 mcg/min (07/22/12 0200)   Assessment:   Pt arrived as a Code STEMI.  We were asked to dose heparin before pt went to cath lab.  Heparin was turned off after cath, and Integrelin was to be continued x 18 hrs.  at 1 mcg/kg/min.  Platelet count fairly stable on Integrilin.  Noted plans for possible CABG.  Goal of Therapy:  Monitor platelets by anticoagulation protocol: Yes   Plan: Integrilin to be d/c'd at 1600 PM today per MD orders. Will need VTE prophylaxis once Integrilin off.  Tad Moore, BCPS  Clinical Pharmacist Pager [phone]  07/22/2012 9:07 AM

## 2012-07-22 NOTE — Progress Notes (Signed)
Cardiac catheter sheath removal note: Right groin arterial sheath removed at 0056, site WNL prior to pull. Manual pressure applied for total of 30 minutes. Patient tolerated procedure well, no complaints. Post sheath removal site WNL, level 0.  Post sheath removal instructions given, patient stated understanding, distant pulse present, pressure dressing applied. Montine Circle, RN

## 2012-07-22 NOTE — Progress Notes (Signed)
ANTICOAGULATION CONSULT NOTE - Follow-up Consult  Pharmacy Consult for Heparin Indication: chest pain/ACS; CAD  No Known Allergies  Patient Measurements: Height: 5\' 1"  (154.9 cm) Weight: 111 lb 12.4 oz (50.7 kg) IBW/kg (Calculated) : 47.8 Heparin Dosing Weight:   Vital Signs: Temp: 97.6 F (36.4 C) (06/21 1200) Temp src: Oral (06/21 1200) BP: 139/69 mmHg (06/21 1600) Pulse Rate: 75 (06/21 1117)  Labs:  Recent Labs  07/21/12 2057 07/21/12 2125 07/21/12 2337 07/22/12 0730 07/22/12 1134  HGB 14.3 14.6 13.9 12.2  --   HCT 40.0 43.0 39.3 35.2*  --   PLT 195  --  217 169  --   APTT 26  --  64*  --   --   LABPROT 12.7  --  12.9  --   --   INR 0.96  --  0.98  --   --   CREATININE 0.86 1.10 0.79 0.71  --   TROPONINI  --   --  1.51* 6.80* 6.79*    Estimated Creatinine Clearance: 53.6 ml/min (by C-G formula based on Cr of 0.71).   Medical History: Past Medical History  Diagnosis Date  . Hypertension     Medications:  Scheduled:  . aspirin  81 mg Oral Daily  . atorvastatin  80 mg Oral q1800  . cloNIDine  0.1 mg Oral BID  . eptifibatide  180 mcg/kg Intravenous Once  . metoprolol tartrate  12.5 mg Oral BID   . sodium chloride 10 mL/hr at 07/22/12 0700  . nitroGLYCERIN Stopped (07/22/12 1400)   Assessment:   Pt arrived as a Code STEMI.  We were asked to dose heparin before pt went to cath lab.  Heparin was turned off after cath, and Integrelin was to be continued x 18 hrs at 1 mcg/kg/min which ended at 1630 6/21. Platelet count fairly stable on Integrilin.  Noted plans for possible CABG.  Now that integrilin is off plan is to start heparin until CABG.  Goal of Therapy:  Monitor platelets by anticoagulation protocol: Yes  HL 0.3-0.7 Plan: Heparin drip 650 uts/hr Check 6hr HL   Daily HL, CBC  Leota Sauers Pharm.D. CPP, BCPS Clinical Pharmacist [phone] 07/22/2012 5:31 PM

## 2012-07-22 NOTE — Consult Note (Signed)
Reason for Consult:3 vessel CAD, post STEMI Referring Physician: Dr. Berry  [name] [name] is an 62 y.o. female.  HPI: 62 yo woman presents with a cc/o CP  She is a 62 yo woman with a history of hypertension, tobacco abuse and a + family history of CAD(brother died of Mi on his 40s). She began having Cp with exertion 2 days prior to admission. On the day of admission she was having frequent episodes and then the pain became constant. Also mild SOB and nausea. She called EMS and was brought to the ED. She was a code STEMI with ongoing CP on arrival. Medical therapy initiated and taken emergently to cath lab. Cath revealed severe 3 vessel CAD. She had inferior hypokinesis as well as mild MR on LV gram. Her pain resolved and she was started on integrelin.  She denies any CP prior to the recent events. However, she did state that she thinks she had a stroke in February of this year. She did not seek medical attention at the time. She says she had some right UE weakness and loss of coordination and that her speech changed. She says these issues have improved since that time. She does not have any problems ambulating.  Past Medical History  Diagnosis Date  . Hypertension   Tobacco abuse Possible stroke in 2/14  History reviewed. No pertinent past surgical history.  History reviewed. No pertinent family history.  Social History:  reports that she has been smoking Cigarettes.  She has been smoking about 0.25 packs per day. She does not have any smokeless tobacco history on file. She reports that she does not drink alcohol or use illicit drugs.  Allergies: No Known Allergies  Medications:  Prior to Admission:  Prescriptions prior to admission  Medication Sig Dispense Refill  . cloNIDine (CATAPRES) 0.1 MG tablet Take 0.1 mg by mouth 2 (two) times daily.      . metoprolol (LOPRESSOR) 50 MG tablet Take 50 mg by mouth 2 (two) times daily.        Results for orders placed during the hospital  encounter of 07/21/12 (from the past 48 hour(s))  CBC     Status: None   Collection Time    07/21/12  8:57 PM      Result Value Range   WBC 8.2  4.0 - 10.5 K/uL   RBC 4.26  3.87 - 5.11 MIL/uL   Hemoglobin 14.3  12.0 - 15.0 g/dL   HCT 40.0  36.0 - 46.0 %   MCV 93.9  78.0 - 100.0 fL   MCH 33.6  26.0 - 34.0 pg   MCHC 35.8  30.0 - 36.0 g/dL   RDW 14.2  11.5 - 15.5 %   Platelets 195  150 - 400 K/uL  DIFFERENTIAL     Status: None   Collection Time    07/21/12  8:57 PM      Result Value Range   Neutrophils Relative % 57  43 - 77 %   Neutro Abs 4.7  1.7 - 7.7 K/uL   Lymphocytes Relative 34  12 - 46 %   Lymphs Abs 2.8  0.7 - 4.0 K/uL   Monocytes Relative 7  3 - 12 %   Monocytes Absolute 0.6  0.1 - 1.0 K/uL   Eosinophils Relative 2  0 - 5 %   Eosinophils Absolute 0.1  0.0 - 0.7 K/uL   Basophils Relative 1  0 - 1 %   Basophils Absolute 0.0  0.0 -   0.1 K/uL  PROTIME-INR     Status: None   Collection Time    07/21/12  8:57 PM      Result Value Range   Prothrombin Time 12.7  11.6 - 15.2 seconds   INR 0.96  0.00 - 1.49  APTT     Status: None   Collection Time    07/21/12  8:57 PM      Result Value Range   aPTT 26  24 - 37 seconds  BASIC METABOLIC PANEL     Status: Abnormal   Collection Time    07/21/12  8:57 PM      Result Value Range   Sodium 139  135 - 145 mEq/L   Potassium 3.1 (*) 3.5 - 5.1 mEq/L   Chloride 101  96 - 112 mEq/L   CO2 27  19 - 32 mEq/L   Glucose, Bld 107 (*) 70 - 99 mg/dL   BUN 15  6 - 23 mg/dL   Creatinine, Ser 0.86  0.50 - 1.10 mg/dL   Calcium 9.8  8.4 - 10.5 mg/dL   GFR calc non Af Amer 70 (*) >90 mL/min   GFR calc Af Amer 81 (*) >90 mL/min   Comment:            The eGFR has been calculated     using the CKD EPI equation.     This calculation has not been     validated in all clinical     situations.     eGFR's persistently     <90 mL/min signify     possible Chronic Kidney Disease.  POCT I-STAT TROPONIN I     Status: None   Collection Time     07/21/12  9:24 PM      Result Value Range   Troponin i, poc 0.01  0.00 - 0.08 ng/mL   Comment 3            Comment: Due to the release kinetics of cTnI,     a negative result within the first hours     of the onset of symptoms does not rule out     myocardial infarction with certainty.     If myocardial infarction is still suspected,     repeat the test at appropriate intervals.  POCT I-STAT, CHEM 8     Status: Abnormal   Collection Time    07/21/12  9:25 PM      Result Value Range   Sodium 143  135 - 145 mEq/L   Potassium 3.1 (*) 3.5 - 5.1 mEq/L   Chloride 103  96 - 112 mEq/L   BUN 14  6 - 23 mg/dL   Creatinine, Ser 1.10  0.50 - 1.10 mg/dL   Glucose, Bld 104 (*) 70 - 99 mg/dL   Calcium, Ion 1.24  1.13 - 1.30 mmol/L   TCO2 28  0 - 100 mmol/L   Hemoglobin 14.6  12.0 - 15.0 g/dL   HCT 43.0  36.0 - 46.0 %  MRSA PCR SCREENING     Status: None   Collection Time    07/21/12 10:20 PM      Result Value Range   MRSA by PCR NEGATIVE  NEGATIVE   Comment:            The GeneXpert MRSA Assay (FDA     approved for NASAL specimens     only), is one component of a     comprehensive MRSA colonization       surveillance program. It is not     intended to diagnose MRSA     infection nor to guide or     monitor treatment for     MRSA infections.  TROPONIN I     Status: Abnormal   Collection Time    07/21/12 11:37 PM      Result Value Range   Troponin I 1.51 (*) <0.30 ng/mL   Comment:            Due to the release kinetics of cTnI,     a negative result within the first hours     of the onset of symptoms does not rule out     myocardial infarction with certainty.     If myocardial infarction is still suspected,     repeat the test at appropriate intervals.     CRITICAL RESULT CALLED TO, READ BACK BY AND VERIFIED WITH:     S STOWE,RN [id number] [facility]  PROTIME-INR     Status: None   Collection Time    07/21/12 11:37 PM      Result Value Range   Prothrombin Time 12.9  11.6 - 15.2  seconds   INR 0.98  0.00 - 1.49  APTT     Status: Abnormal   Collection Time    07/21/12 11:37 PM      Result Value Range   aPTT 64 (*) 24 - 37 seconds   Comment:            IF BASELINE aPTT IS ELEVATED,     SUGGEST PATIENT RISK ASSESSMENT     BE USED TO DETERMINE APPROPRIATE     ANTICOAGULANT THERAPY.  CBC WITH DIFFERENTIAL     Status: Abnormal   Collection Time    07/21/12 11:37 PM      Result Value Range   WBC 8.8  4.0 - 10.5 K/uL   RBC 4.26  3.87 - 5.11 MIL/uL   Hemoglobin 13.9  12.0 - 15.0 g/dL   HCT 39.3  36.0 - 46.0 %   MCV 92.3  78.0 - 100.0 fL   MCH 32.6  26.0 - 34.0 pg   MCHC 35.4  30.0 - 36.0 g/dL   RDW 14.0  11.5 - 15.5 %   Platelets 217  150 - 400 K/uL   Neutrophils Relative % 78 (*) 43 - 77 %   Neutro Abs 6.9  1.7 - 7.7 K/uL   Lymphocytes Relative 16  12 - 46 %   Lymphs Abs 1.4  0.7 - 4.0 K/uL   Monocytes Relative 5  3 - 12 %   Monocytes Absolute 0.5  0.1 - 1.0 K/uL   Eosinophils Relative 1  0 - 5 %   Eosinophils Absolute 0.1  0.0 - 0.7 K/uL   Basophils Relative 0  0 - 1 %   Basophils Absolute 0.0  0.0 - 0.1 K/uL  COMPREHENSIVE METABOLIC PANEL     Status: Abnormal   Collection Time    07/21/12 11:37 PM      Result Value Range   Sodium 138  135 - 145 mEq/L   Potassium 3.1 (*) 3.5 - 5.1 mEq/L   Chloride 103  96 - 112 mEq/L   CO2 23  19 - 32 mEq/L   Glucose, Bld 129 (*) 70 - 99 mg/dL   BUN 13  6 - 23 mg/dL   Creatinine, Ser 0.79  0.50 - 1.10 mg/dL   Calcium 9.0  8.4 - 10.5 mg/dL     Total Protein 6.6  6.0 - 8.3 g/dL   Albumin 3.2 (*) 3.5 - 5.2 g/dL   AST 19  0 - 37 U/L   ALT 9  0 - 35 U/L   Alkaline Phosphatase 65  39 - 117 U/L   Total Bilirubin 0.3  0.3 - 1.2 mg/dL   GFR calc non Af Amer 86 (*) >90 mL/min   GFR calc Af Amer >90  >90 mL/min   Comment:            The eGFR has been calculated     using the CKD EPI equation.     This calculation has not been     validated in all clinical     situations.     eGFR's persistently     <90 mL/min signify      possible Chronic Kidney Disease.  PRO B NATRIURETIC PEPTIDE     Status: Abnormal   Collection Time    07/21/12 11:37 PM      Result Value Range   Pro B Natriuretic peptide (BNP) 1358.0 (*) 0 - 125 pg/mL  TROPONIN I     Status: Abnormal   Collection Time    07/22/12  7:30 AM      Result Value Range   Troponin I 6.80 (*) <0.30 ng/mL   Comment:            Due to the release kinetics of cTnI,     a negative result within the first hours     of the onset of symptoms does not rule out     myocardial infarction with certainty.     If myocardial infarction is still suspected,     repeat the test at appropriate intervals.     CRITICAL VALUE NOTED.  VALUE IS CONSISTENT WITH PREVIOUSLY REPORTED AND CALLED VALUE.  LIPID PANEL     Status: Abnormal   Collection Time    07/22/12  7:30 AM      Result Value Range   Cholesterol 187  0 - 200 mg/dL   Triglycerides 87  <150 mg/dL   HDL 50  >39 mg/dL   Total CHOL/HDL Ratio 3.7     VLDL 17  0 - 40 mg/dL   LDL Cholesterol 120 (*) 0 - 99 mg/dL   Comment:            Total Cholesterol/HDL:CHD Risk     Coronary Heart Disease Risk Table                         Men   Women      1/2 Average Risk   3.4   3.3      Average Risk       5.0   4.4      2 X Average Risk   9.6   7.1      3 X Average Risk  23.4   11.0                Use the calculated Patient Ratio     above and the CHD Risk Table     to determine the patient's CHD Risk.                ATP III CLASSIFICATION (LDL):      <100     mg/dL   Optimal      100-129  mg/dL   Near or Above                          Optimal      130-159  mg/dL   Borderline      160-189  mg/dL   High      >190     mg/dL   Very High  BASIC METABOLIC PANEL     Status: Abnormal   Collection Time    07/22/12  7:30 AM      Result Value Range   Sodium 140  135 - 145 mEq/L   Potassium 4.1  3.5 - 5.1 mEq/L   Chloride 109  96 - 112 mEq/L   CO2 22  19 - 32 mEq/L   Glucose, Bld 98  70 - 99 mg/dL   BUN 11  6 - 23 mg/dL    Creatinine, Ser 0.71  0.50 - 1.10 mg/dL   Calcium 8.7  8.4 - 10.5 mg/dL   GFR calc non Af Amer 89 (*) >90 mL/min   GFR calc Af Amer >90  >90 mL/min   Comment:            The eGFR has been calculated     using the CKD EPI equation.     This calculation has not been     validated in all clinical     situations.     eGFR's persistently     <90 mL/min signify     possible Chronic Kidney Disease.  CBC     Status: Abnormal   Collection Time    07/22/12  7:30 AM      Result Value Range   WBC 6.5  4.0 - 10.5 K/uL   RBC 3.77 (*) 3.87 - 5.11 MIL/uL   Hemoglobin 12.2  12.0 - 15.0 g/dL   HCT 35.2 (*) 36.0 - 46.0 %   MCV 93.4  78.0 - 100.0 fL   MCH 32.4  26.0 - 34.0 pg   MCHC 34.7  30.0 - 36.0 g/dL   RDW 14.3  11.5 - 15.5 %   Platelets 169  150 - 400 K/uL    No results found.  Review of Systems  Constitutional: Negative for fever and chills.  Respiratory: Positive for shortness of breath. Negative for wheezing.   Cardiovascular: Positive for chest pain and claudication (pain right leg when walking- "poor circulation"). Negative for orthopnea.  Genitourinary: Negative.   Neurological: Positive for speech change (in Feb- has improved) and focal weakness (right side in Feb- improved since then).  All other systems reviewed and are negative.   Blood pressure 165/84, pulse 75, temperature 98.4 F (36.9 C), temperature source Oral, resp. rate 21, height 5' 1" (1.549 m), weight 111 lb 12.4 oz (50.7 kg), SpO2 98.00%. Physical Exam  Vitals reviewed. Constitutional: She is oriented to person, place, and time. No distress.  thin  HENT:  Head: Normocephalic and atraumatic.  Eyes: EOM are normal. Pupils are equal, round, and reactive to light.  Neck: Neck supple. No thyromegaly present.  No audible bruit  Cardiovascular: Normal rate, regular rhythm and normal heart sounds.  Exam reveals no gallop and no friction rub.   No murmur heard. Respiratory: She has wheezes. She has no rales.   Coarse BS bilaterally  GI: Soft. There is no tenderness.  Musculoskeletal: She exhibits no edema.  Lymphadenopathy:    She has no cervical adenopathy.  Neurological: She is alert and oriented to person, place, and time.  RUE 4+/5, LUE 5/5  Skin: Skin is warm and dry.   HEMODYNAMICS:  AO SYSTOLIC/AO DIASTOLIC: 171/87  LV SYSTOLIC/LV DIASTOLIC: 182/35  ANGIOGRAPHIC   RESULTS:  1. Left main; 30% distal  2. LAD; 60-70% segmental mid followed by 90% fairly focal just beyond this. The first diagonal branch was moderate in size and had an 80-90% proximal stenosis  3. Left circumflex; is a nondominant vessel is small in caliber. It was totally occluded in its midportion and this appeared to be chronic. Gave off a moderate-sized first marginal branch. There was a 90% AV groove circumflex stenosis in the proximal third just before the takeoff of the marginal branch..  4. Right coronary artery; dominant, postop Stapley calcified with 95% stenosis on the first bend, and 90% stenosis on the distal bend but before the Genu , 70% stenosis at the crux, 95% ostial PDA stenosis.  5.LIMA with the subselectively visualized and widely patent. It was suitable for use during coronary artery bypass grafting if necessary  6. Left ventriculography; RAO left ventriculogram was performed using  25 mL of Visipaque dye at 12 mL/second. The overall LVEF estimated  45-50 % With wall motion abnormalities notable for moderate to severe inferobasal hypokinesia and moderate posterolateral hypokinesia  IMPRESSION:[name] [name] has three-vessel disease with mild LV dysfunction. Her circumflex is a small nondominant vessel and is occluded in its midportion. This appears chronic. She does have LAD and diagonal branch disease. I believe her culprit vessel is her dominant RCA which is patent with TIMI-3 flow. This is very tortuous and calcified and not ideal for percutaneous intervention. The patient is pain-free and hemodynamically  stable. I believe the best therapy would be coronary artery bypass grafting for complete revascularization. Patient will be treated with Aspirin, beta blocker and IIb IIIa inhibitor (Integrillin). She has not been given oral antiplatelet medications. Dr. Steve Hendrickson from [facility] as has been notified.     Assessment/Plan: 62 yo woman with multiple CRF presented with a STEMi which aborted. She is currently pain free.  She has severe 3 vessel CAD and needs CABG for survival benefit and relief of symptoms.  The primary concern is her history of a possible stroke in February of this year. Her history is convincing but she did not seek out a medical evaluation at that time. With that in mind it would probably be best to have her carotids evaluated prior to CABG. That is assuming she remains stable. If she develops unstable CP again she would need to go for urgent revascularization regardless.  I discussed with her the general nature of CABG, the need for general anesthesia, and the incisions to be used. I discussed the expected hospital stay, overall recovery and short and long term outcomes. We discussed the indications, risks, benefits and alternatives. She understands the risks include but are not limited to death, stroke, MI, DVT/PE, bleeding, possible need for transfusion, infections, cardiac arrhythmias, and other organ system dysfunction including respiratory, renal, or GI complications. She accepts these risks and agrees to proceed.  HENDRICKSON,STEVEN C 07/22/2012, 11:26 AM      

## 2012-07-22 NOTE — Progress Notes (Signed)
Subjective:  No CP/SOB  Objective:  Temp:  [97.8 F (36.6 C)-98.6 F (37 C)] 97.8 F (36.6 C) (06/21 0400) Pulse Rate:  [69-79] 79 (06/20 2113) Resp:  [17-28] 18 (06/21 0400) BP: (109-205)/(60-90) 115/71 mmHg (06/21 0400) SpO2:  [98 %-100 %] 99 % (06/21 0400) Arterial Line BP: (138-228)/(70-113) 172/84 mmHg (06/21 0045) Weight:  [111 lb 12.4 oz (50.7 kg)-125 lb (56.7 kg)] 111 lb 12.4 oz (50.7 kg) (06/20 2321) Weight change:   Intake/Output from previous day: 06/20 0701 - 06/21 0700 In: 1029.5 [P.O.:120; I.V.:509.5; IV Piggyback:400] Out: 800 [Urine:800]  Intake/Output from this shift: Total I/O In: 1029.5 [P.O.:120; I.V.:509.5; IV Piggyback:400] Out: 800 [Urine:800]  Physical Exam: General appearance: alert and no distress Neck: no adenopathy, no carotid bruit, no JVD, supple, symmetrical, trachea midline and thyroid not enlarged, symmetric, no tenderness/mass/nodules Lungs: clear to auscultation bilaterally Heart: regular rate and rhythm, S1, S2 normal, no murmur, click, rub or gallop Extremities: extremities normal, atraumatic, no cyanosis or edema and Right groin OK  Lab Results: Results for orders placed during the hospital encounter of 07/21/12 (from the past 48 hour(s))  CBC     Status: None   Collection Time    07/21/12  8:57 PM      Result Value Range   WBC 8.2  4.0 - 10.5 K/uL   RBC 4.26  3.87 - 5.11 MIL/uL   Hemoglobin 14.3  12.0 - 15.0 g/dL   HCT 60.4  54.0 - 98.1 %   MCV 93.9  78.0 - 100.0 fL   MCH 33.6  26.0 - 34.0 pg   MCHC 35.8  30.0 - 36.0 g/dL   RDW 19.1  47.8 - 29.5 %   Platelets 195  150 - 400 K/uL  DIFFERENTIAL     Status: None   Collection Time    07/21/12  8:57 PM      Result Value Range   Neutrophils Relative % 57  43 - 77 %   Neutro Abs 4.7  1.7 - 7.7 K/uL   Lymphocytes Relative 34  12 - 46 %   Lymphs Abs 2.8  0.7 - 4.0 K/uL   Monocytes Relative 7  3 - 12 %   Monocytes Absolute 0.6  0.1 - 1.0 K/uL   Eosinophils Relative 2  0 - 5 %    Eosinophils Absolute 0.1  0.0 - 0.7 K/uL   Basophils Relative 1  0 - 1 %   Basophils Absolute 0.0  0.0 - 0.1 K/uL  PROTIME-INR     Status: None   Collection Time    07/21/12  8:57 PM      Result Value Range   Prothrombin Time 12.7  11.6 - 15.2 seconds   INR 0.96  0.00 - 1.49  APTT     Status: None   Collection Time    07/21/12  8:57 PM      Result Value Range   aPTT 26  24 - 37 seconds  BASIC METABOLIC PANEL     Status: Abnormal   Collection Time    07/21/12  8:57 PM      Result Value Range   Sodium 139  135 - 145 mEq/L   Potassium 3.1 (*) 3.5 - 5.1 mEq/L   Chloride 101  96 - 112 mEq/L   CO2 27  19 - 32 mEq/L   Glucose, Bld 107 (*) 70 - 99 mg/dL   BUN 15  6 - 23 mg/dL   Creatinine, Ser 6.21  0.50 - 1.10 mg/dL   Calcium 9.8  8.4 - 96.0 mg/dL   GFR calc non Af Amer 70 (*) >90 mL/min   GFR calc Af Amer 81 (*) >90 mL/min   Comment:            The eGFR has been calculated     using the CKD EPI equation.     This calculation has not been     validated in all clinical     situations.     eGFR's persistently     <90 mL/min signify     possible Chronic Kidney Disease.  POCT I-STAT TROPONIN I     Status: None   Collection Time    07/21/12  9:24 PM      Result Value Range   Troponin i, poc 0.01  0.00 - 0.08 ng/mL   Comment 3            Comment: Due to the release kinetics of cTnI,     a negative result within the first hours     of the onset of symptoms does not rule out     myocardial infarction with certainty.     If myocardial infarction is still suspected,     repeat the test at appropriate intervals.  POCT I-STAT, CHEM 8     Status: Abnormal   Collection Time    07/21/12  9:25 PM      Result Value Range   Sodium 143  135 - 145 mEq/L   Potassium 3.1 (*) 3.5 - 5.1 mEq/L   Chloride 103  96 - 112 mEq/L   BUN 14  6 - 23 mg/dL   Creatinine, Ser 4.54  0.50 - 1.10 mg/dL   Glucose, Bld 098 (*) 70 - 99 mg/dL   Calcium, Ion 1.19  1.47 - 1.30 mmol/L   TCO2 28  0 - 100  mmol/L   Hemoglobin 14.6  12.0 - 15.0 g/dL   HCT 82.9  56.2 - 13.0 %  MRSA PCR SCREENING     Status: None   Collection Time    07/21/12 10:20 PM      Result Value Range   MRSA by PCR NEGATIVE  NEGATIVE   Comment:            The GeneXpert MRSA Assay (FDA     approved for NASAL specimens     only), is one component of a     comprehensive MRSA colonization     surveillance program. It is not     intended to diagnose MRSA     infection nor to guide or     monitor treatment for     MRSA infections.  TROPONIN I     Status: Abnormal   Collection Time    07/21/12 11:37 PM      Result Value Range   Troponin I 1.51 (*) <0.30 ng/mL   Comment:            Due to the release kinetics of cTnI,     a negative result within the first hours     of the onset of symptoms does not rule out     myocardial infarction with certainty.     If myocardial infarction is still suspected,     repeat the test at appropriate intervals.     CRITICAL RESULT CALLED TO, READ BACK BY AND VERIFIED WITH:     Maricela Bo [id number] [facility]  PROTIME-INR  Status: None   Collection Time    07/21/12 11:37 PM      Result Value Range   Prothrombin Time 12.9  11.6 - 15.2 seconds   INR 0.98  0.00 - 1.49  APTT     Status: Abnormal   Collection Time    07/21/12 11:37 PM      Result Value Range   aPTT 64 (*) 24 - 37 seconds   Comment:            IF BASELINE aPTT IS ELEVATED,     SUGGEST PATIENT RISK ASSESSMENT     BE USED TO DETERMINE APPROPRIATE     ANTICOAGULANT THERAPY.  CBC WITH DIFFERENTIAL     Status: Abnormal   Collection Time    07/21/12 11:37 PM      Result Value Range   WBC 8.8  4.0 - 10.5 K/uL   RBC 4.26  3.87 - 5.11 MIL/uL   Hemoglobin 13.9  12.0 - 15.0 g/dL   HCT 40.9  81.1 - 91.4 %   MCV 92.3  78.0 - 100.0 fL   MCH 32.6  26.0 - 34.0 pg   MCHC 35.4  30.0 - 36.0 g/dL   RDW 78.2  95.6 - 21.3 %   Platelets 217  150 - 400 K/uL   Neutrophils Relative % 78 (*) 43 - 77 %   Neutro Abs 6.9  1.7 -  7.7 K/uL   Lymphocytes Relative 16  12 - 46 %   Lymphs Abs 1.4  0.7 - 4.0 K/uL   Monocytes Relative 5  3 - 12 %   Monocytes Absolute 0.5  0.1 - 1.0 K/uL   Eosinophils Relative 1  0 - 5 %   Eosinophils Absolute 0.1  0.0 - 0.7 K/uL   Basophils Relative 0  0 - 1 %   Basophils Absolute 0.0  0.0 - 0.1 K/uL  COMPREHENSIVE METABOLIC PANEL     Status: Abnormal   Collection Time    07/21/12 11:37 PM      Result Value Range   Sodium 138  135 - 145 mEq/L   Potassium 3.1 (*) 3.5 - 5.1 mEq/L   Chloride 103  96 - 112 mEq/L   CO2 23  19 - 32 mEq/L   Glucose, Bld 129 (*) 70 - 99 mg/dL   BUN 13  6 - 23 mg/dL   Creatinine, Ser 0.86  0.50 - 1.10 mg/dL   Calcium 9.0  8.4 - 57.8 mg/dL   Total Protein 6.6  6.0 - 8.3 g/dL   Albumin 3.2 (*) 3.5 - 5.2 g/dL   AST 19  0 - 37 U/L   ALT 9  0 - 35 U/L   Alkaline Phosphatase 65  39 - 117 U/L   Total Bilirubin 0.3  0.3 - 1.2 mg/dL   GFR calc non Af Amer 86 (*) >90 mL/min   GFR calc Af Amer >90  >90 mL/min   Comment:            The eGFR has been calculated     using the CKD EPI equation.     This calculation has not been     validated in all clinical     situations.     eGFR's persistently     <90 mL/min signify     possible Chronic Kidney Disease.  PRO B NATRIURETIC PEPTIDE     Status: Abnormal   Collection Time    07/21/12 11:37 PM  Result Value Range   Pro B Natriuretic peptide (BNP) 1358.0 (*) 0 - 125 pg/mL    Imaging: Imaging results have been reviewed  Assessment/Plan:   1. Active Problems: 2.   STEMI (ST elevation myocardial infarction) 3.   Essential hypertension 4.   Tobacco abuse 5.   Time Spent Directly with Patient:  20 minutes  Length of Stay:  LOS: 1 day   POD # 1 inf STEMI. Surgical anatomy. No CP/SOB. Exam benign. Initial trop 1.5. K 3.1, replete. Discussed case with Dr. Dorris Fetch who will see pt today. On iv NTG and integrelin.  [name] 07/22/2012, 5:48 AM

## 2012-07-23 DIAGNOSIS — I10 Essential (primary) hypertension: Secondary | ICD-10-CM

## 2012-07-23 LAB — CBC
HCT: 40.3 % (ref 36.0–46.0)
MCHC: 33.7 g/dL (ref 30.0–36.0)
MCV: 95.3 fL (ref 78.0–100.0)
Platelets: 175 10*3/uL (ref 150–400)
RDW: 14.5 % (ref 11.5–15.5)
WBC: 6.1 10*3/uL (ref 4.0–10.5)

## 2012-07-23 LAB — HEPARIN LEVEL (UNFRACTIONATED): Heparin Unfractionated: 0.51 IU/mL (ref 0.30–0.70)

## 2012-07-23 MED ORDER — BISACODYL 5 MG PO TBEC: 5.0000 mg | DELAYED_RELEASE_TABLET | Freq: Once | ORAL | Status: DC

## 2012-07-23 MED ORDER — DIAZEPAM 2 MG PO TABS: 2.0000 mg | ORAL_TABLET | Freq: Once | ORAL | Status: DC

## 2012-07-23 MED ORDER — TEMAZEPAM 15 MG PO CAPS: 15.0000 mg | ORAL_CAPSULE | Freq: Once | ORAL | Status: AC | PRN

## 2012-07-23 MED ORDER — CHLORHEXIDINE GLUCONATE 4 % EX LIQD
60.0000 mL | Freq: Once | CUTANEOUS | Status: DC
  Filled 2012-07-23: qty 60

## 2012-07-23 MED ORDER — ALPRAZOLAM 0.25 MG PO TABS
0.2500 mg | ORAL_TABLET | ORAL | Status: DC | PRN
  Administered 2012-07-23: 0.25 mg via ORAL
  Filled 2012-07-23: qty 1

## 2012-07-23 MED ORDER — CHLORHEXIDINE GLUCONATE 4 % EX LIQD
60.0000 mL | Freq: Once | CUTANEOUS | Status: AC
  Administered 2012-07-24: 4 via TOPICAL
  Filled 2012-07-23: qty 60

## 2012-07-23 MED ORDER — METOPROLOL TARTRATE 50 MG PO TABS
50.0000 mg | ORAL_TABLET | Freq: Two times a day (BID) | ORAL | Status: DC
  Administered 2012-07-23 – 2012-07-25 (×5): 50 mg via ORAL
  Filled 2012-07-23 (×6): qty 1

## 2012-07-23 MED ORDER — METOPROLOL TARTRATE 12.5 MG HALF TABLET
12.5000 mg | ORAL_TABLET | Freq: Once | ORAL | Status: DC
  Filled 2012-07-23: qty 1

## 2012-07-23 NOTE — Progress Notes (Signed)
Subjective:  No CP/SOB  Objective:  Temp:  [97.6 F (36.4 C)-98.7 F (37.1 C)] 98.5 F (36.9 C) (06/22 0730) Pulse Rate:  [65-75] 68 (06/22 0730) Resp:  [15-24] 16 (06/22 0400) BP: (115-192)/(49-105) 170/98 mmHg (06/22 0732) SpO2:  [98 %-100 %] 98 % (06/22 0732) Weight change:   Intake/Output from previous day: 06/21 0701 - 06/22 0700 In: 600.3 [P.O.:240; I.V.:360.3] Out: 1050 [Urine:1050]  Intake/Output from this shift:    Physical Exam: General appearance: alert and no distress Neck: no adenopathy, no carotid bruit, no JVD, supple, symmetrical, trachea midline and thyroid not enlarged, symmetric, no tenderness/mass/nodules Lungs: clear to auscultation bilaterally Heart: regular rate and rhythm, S1, S2 normal, no murmur, click, rub or gallop Extremities: extremities normal, atraumatic, no cyanosis or edema and Right groin OK  Lab Results: Results for orders placed during the hospital encounter of 07/21/12 (from the past 48 hour(s))  CBC     Status: None   Collection Time    07/21/12  8:57 PM      Result Value Range   WBC 8.2  4.0 - 10.5 K/uL   RBC 4.26  3.87 - 5.11 MIL/uL   Hemoglobin 14.3  12.0 - 15.0 g/dL   HCT 16.1  09.6 - 04.5 %   MCV 93.9  78.0 - 100.0 fL   MCH 33.6  26.0 - 34.0 pg   MCHC 35.8  30.0 - 36.0 g/dL   RDW 40.9  81.1 - 91.4 %   Platelets 195  150 - 400 K/uL  DIFFERENTIAL     Status: None   Collection Time    07/21/12  8:57 PM      Result Value Range   Neutrophils Relative % 57  43 - 77 %   Neutro Abs 4.7  1.7 - 7.7 K/uL   Lymphocytes Relative 34  12 - 46 %   Lymphs Abs 2.8  0.7 - 4.0 K/uL   Monocytes Relative 7  3 - 12 %   Monocytes Absolute 0.6  0.1 - 1.0 K/uL   Eosinophils Relative 2  0 - 5 %   Eosinophils Absolute 0.1  0.0 - 0.7 K/uL   Basophils Relative 1  0 - 1 %   Basophils Absolute 0.0  0.0 - 0.1 K/uL  PROTIME-INR     Status: None   Collection Time    07/21/12  8:57 PM      Result Value Range   Prothrombin Time 12.7  11.6 -  15.2 seconds   INR 0.96  0.00 - 1.49  APTT     Status: None   Collection Time    07/21/12  8:57 PM      Result Value Range   aPTT 26  24 - 37 seconds  BASIC METABOLIC PANEL     Status: Abnormal   Collection Time    07/21/12  8:57 PM      Result Value Range   Sodium 139  135 - 145 mEq/L   Potassium 3.1 (*) 3.5 - 5.1 mEq/L   Chloride 101  96 - 112 mEq/L   CO2 27  19 - 32 mEq/L   Glucose, Bld 107 (*) 70 - 99 mg/dL   BUN 15  6 - 23 mg/dL   Creatinine, Ser 7.82  0.50 - 1.10 mg/dL   Calcium 9.8  8.4 - 95.6 mg/dL   GFR calc non Af Amer 70 (*) >90 mL/min   GFR calc Af Amer 81 (*) >90 mL/min   Comment:  The eGFR has been calculated     using the CKD EPI equation.     This calculation has not been     validated in all clinical     situations.     eGFR's persistently     <90 mL/min signify     possible Chronic Kidney Disease.  POCT I-STAT TROPONIN I     Status: None   Collection Time    07/21/12  9:24 PM      Result Value Range   Troponin i, poc 0.01  0.00 - 0.08 ng/mL   Comment 3            Comment: Due to the release kinetics of cTnI,     a negative result within the first hours     of the onset of symptoms does not rule out     myocardial infarction with certainty.     If myocardial infarction is still suspected,     repeat the test at appropriate intervals.  POCT I-STAT, CHEM 8     Status: Abnormal   Collection Time    07/21/12  9:25 PM      Result Value Range   Sodium 143  135 - 145 mEq/L   Potassium 3.1 (*) 3.5 - 5.1 mEq/L   Chloride 103  96 - 112 mEq/L   BUN 14  6 - 23 mg/dL   Creatinine, Ser 1.61  0.50 - 1.10 mg/dL   Glucose, Bld 096 (*) 70 - 99 mg/dL   Calcium, Ion 0.45  4.09 - 1.30 mmol/L   TCO2 28  0 - 100 mmol/L   Hemoglobin 14.6  12.0 - 15.0 g/dL   HCT 81.1  91.4 - 78.2 %  MRSA PCR SCREENING     Status: None   Collection Time    07/21/12 10:20 PM      Result Value Range   MRSA by PCR NEGATIVE  NEGATIVE   Comment:            The GeneXpert MRSA  Assay (FDA     approved for NASAL specimens     only), is one component of a     comprehensive MRSA colonization     surveillance program. It is not     intended to diagnose MRSA     infection nor to guide or     monitor treatment for     MRSA infections.  TROPONIN I     Status: Abnormal   Collection Time    07/21/12 11:37 PM      Result Value Range   Troponin I 1.51 (*) <0.30 ng/mL   Comment:            Due to the release kinetics of cTnI,     a negative result within the first hours     of the onset of symptoms does not rule out     myocardial infarction with certainty.     If myocardial infarction is still suspected,     repeat the test at appropriate intervals.     CRITICAL RESULT CALLED TO, READ BACK BY AND VERIFIED WITH:     Maricela Bo [phone] WILDERK  PROTIME-INR     Status: None   Collection Time    07/21/12 11:37 PM      Result Value Range   Prothrombin Time 12.9  11.6 - 15.2 seconds   INR 0.98  0.00 - 1.49  APTT     Status: Abnormal  Collection Time    07/21/12 11:37 PM      Result Value Range   aPTT 64 (*) 24 - 37 seconds   Comment:            IF BASELINE aPTT IS ELEVATED,     SUGGEST PATIENT RISK ASSESSMENT     BE USED TO DETERMINE APPROPRIATE     ANTICOAGULANT THERAPY.  CBC WITH DIFFERENTIAL     Status: Abnormal   Collection Time    07/21/12 11:37 PM      Result Value Range   WBC 8.8  4.0 - 10.5 K/uL   RBC 4.26  3.87 - 5.11 MIL/uL   Hemoglobin 13.9  12.0 - 15.0 g/dL   HCT 28.4  13.2 - 44.0 %   MCV 92.3  78.0 - 100.0 fL   MCH 32.6  26.0 - 34.0 pg   MCHC 35.4  30.0 - 36.0 g/dL   RDW 10.2  72.5 - 36.6 %   Platelets 217  150 - 400 K/uL   Neutrophils Relative % 78 (*) 43 - 77 %   Neutro Abs 6.9  1.7 - 7.7 K/uL   Lymphocytes Relative 16  12 - 46 %   Lymphs Abs 1.4  0.7 - 4.0 K/uL   Monocytes Relative 5  3 - 12 %   Monocytes Absolute 0.5  0.1 - 1.0 K/uL   Eosinophils Relative 1  0 - 5 %   Eosinophils Absolute 0.1  0.0 - 0.7 K/uL   Basophils  Relative 0  0 - 1 %   Basophils Absolute 0.0  0.0 - 0.1 K/uL  COMPREHENSIVE METABOLIC PANEL     Status: Abnormal   Collection Time    07/21/12 11:37 PM      Result Value Range   Sodium 138  135 - 145 mEq/L   Potassium 3.1 (*) 3.5 - 5.1 mEq/L   Chloride 103  96 - 112 mEq/L   CO2 23  19 - 32 mEq/L   Glucose, Bld 129 (*) 70 - 99 mg/dL   BUN 13  6 - 23 mg/dL   Creatinine, Ser 4.40  0.50 - 1.10 mg/dL   Calcium 9.0  8.4 - 34.7 mg/dL   Total Protein 6.6  6.0 - 8.3 g/dL   Albumin 3.2 (*) 3.5 - 5.2 g/dL   AST 19  0 - 37 U/L   ALT 9  0 - 35 U/L   Alkaline Phosphatase 65  39 - 117 U/L   Total Bilirubin 0.3  0.3 - 1.2 mg/dL   GFR calc non Af Amer 86 (*) >90 mL/min   GFR calc Af Amer >90  >90 mL/min   Comment:            The eGFR has been calculated     using the CKD EPI equation.     This calculation has not been     validated in all clinical     situations.     eGFR's persistently     <90 mL/min signify     possible Chronic Kidney Disease.  PRO B NATRIURETIC PEPTIDE     Status: Abnormal   Collection Time    07/21/12 11:37 PM      Result Value Range   Pro B Natriuretic peptide (BNP) 1358.0 (*) 0 - 125 pg/mL  HEMOGLOBIN A1C     Status: None   Collection Time    07/21/12 11:37 PM      Result Value Range  Hemoglobin A1C 5.4  <5.7 %   Comment: (NOTE)                                                                               According to the ADA Clinical Practice Recommendations for 2011, when     HbA1c is used as a screening test:      >=6.5%   Diagnostic of Diabetes Mellitus               (if abnormal result is confirmed)     5.7-6.4%   Increased risk of developing Diabetes Mellitus     References:Diagnosis and Classification of Diabetes Mellitus,Diabetes     Care,2011,34(Suppl 1):S62-S69 and Standards of Medical Care in             Diabetes - 2011,Diabetes Care,2011,34 (Suppl 1):S11-S61.   Mean Plasma Glucose 108  <117 mg/dL  TSH     Status: None   Collection Time     07/21/12 11:59 PM      Result Value Range   TSH 0.570  0.350 - 4.500 uIU/mL  TROPONIN I     Status: Abnormal   Collection Time    07/22/12  7:30 AM      Result Value Range   Troponin I 6.80 (*) <0.30 ng/mL   Comment:            Due to the release kinetics of cTnI,     a negative result within the first hours     of the onset of symptoms does not rule out     myocardial infarction with certainty.     If myocardial infarction is still suspected,     repeat the test at appropriate intervals.     CRITICAL VALUE NOTED.  VALUE IS CONSISTENT WITH PREVIOUSLY REPORTED AND CALLED VALUE.  LIPID PANEL     Status: Abnormal   Collection Time    07/22/12  7:30 AM      Result Value Range   Cholesterol 187  0 - 200 mg/dL   Triglycerides 87  <161 mg/dL   HDL 50  >09 mg/dL   Total CHOL/HDL Ratio 3.7     VLDL 17  0 - 40 mg/dL   LDL Cholesterol 604 (*) 0 - 99 mg/dL   Comment:            Total Cholesterol/HDL:CHD Risk     Coronary Heart Disease Risk Table                         Men   Women      1/2 Average Risk   3.4   3.3      Average Risk       5.0   4.4      2 X Average Risk   9.6   7.1      3 X Average Risk  23.4   11.0                Use the calculated Patient Ratio     above and the CHD Risk Table     to determine the patient's CHD Risk.  ATP III CLASSIFICATION (LDL):      <100     mg/dL   Optimal      161-096  mg/dL   Near or Above                        Optimal      130-159  mg/dL   Borderline      045-409  mg/dL   High      >811     mg/dL   Very High  BASIC METABOLIC PANEL     Status: Abnormal   Collection Time    07/22/12  7:30 AM      Result Value Range   Sodium 140  135 - 145 mEq/L   Potassium 4.1  3.5 - 5.1 mEq/L   Chloride 109  96 - 112 mEq/L   CO2 22  19 - 32 mEq/L   Glucose, Bld 98  70 - 99 mg/dL   BUN 11  6 - 23 mg/dL   Creatinine, Ser 9.14  0.50 - 1.10 mg/dL   Calcium 8.7  8.4 - 78.2 mg/dL   GFR calc non Af Amer 89 (*) >90 mL/min   GFR calc Af  Amer >90  >90 mL/min   Comment:            The eGFR has been calculated     using the CKD EPI equation.     This calculation has not been     validated in all clinical     situations.     eGFR's persistently     <90 mL/min signify     possible Chronic Kidney Disease.  CBC     Status: Abnormal   Collection Time    07/22/12  7:30 AM      Result Value Range   WBC 6.5  4.0 - 10.5 K/uL   RBC 3.77 (*) 3.87 - 5.11 MIL/uL   Hemoglobin 12.2  12.0 - 15.0 g/dL   HCT 95.6 (*) 21.3 - 08.6 %   MCV 93.4  78.0 - 100.0 fL   MCH 32.4  26.0 - 34.0 pg   MCHC 34.7  30.0 - 36.0 g/dL   RDW 57.8  46.9 - 62.9 %   Platelets 169  150 - 400 K/uL  TROPONIN I     Status: Abnormal   Collection Time    07/22/12 11:34 AM      Result Value Range   Troponin I 6.79 (*) <0.30 ng/mL   Comment:            Due to the release kinetics of cTnI,     a negative result within the first hours     of the onset of symptoms does not rule out     myocardial infarction with certainty.     If myocardial infarction is still suspected,     repeat the test at appropriate intervals.     CRITICAL VALUE NOTED.  VALUE IS CONSISTENT WITH PREVIOUSLY REPORTED AND CALLED VALUE.  HEPARIN LEVEL (UNFRACTIONATED)     Status: Abnormal   Collection Time    07/23/12 12:01 AM      Result Value Range   Heparin Unfractionated 0.21 (*) 0.30 - 0.70 IU/mL   Comment:            IF HEPARIN RESULTS ARE BELOW     EXPECTED VALUES, AND PATIENT     DOSAGE HAS BEEN CONFIRMED,     SUGGEST FOLLOW  UP TESTING     OF ANTITHROMBIN III LEVELS.    Imaging: Imaging results have been reviewed  Assessment/Plan:   1. Active Problems: 2.   STEMI (ST elevation myocardial infarction) 3.   Essential hypertension 4.   Tobacco abuse 5.   Time Spent Directly with Patient:  20 minutes  Length of Stay:  LOS: 2 days   S/P Inf STEMI (aborted), with cath that revealed 3 VD with mild LV dysfunction. For CABG on Tuesday. No CP/SOB. Exam benign. Trop peaked  at 7. Will re check 12 lead EKG. On IV hep. BP increased. Will adjust meds. Transfer to step down.  [name] 07/23/2012, 7:36 AM

## 2012-07-23 NOTE — Progress Notes (Signed)
ANTICOAGULATION CONSULT NOTE - Follow Up Consult  Pharmacy Consult for heparin Indication: CAD awaiting CABG  Labs:  Recent Labs  07/21/12 2057 07/21/12 2125 07/21/12 2337 07/22/12 0730 07/22/12 1134 07/23/12 0001  HGB 14.3 14.6 13.9 12.2  --   --   HCT 40.0 43.0 39.3 35.2*  --   --   PLT 195  --  217 169  --   --   APTT 26  --  64*  --   --   --   LABPROT 12.7  --  12.9  --   --   --   INR 0.96  --  0.98  --   --   --   HEPARINUNFRC  --   --   --   --   --  0.21*  CREATININE 0.86 1.10 0.79 0.71  --   --   TROPONINI  --   --  1.51* 6.80* 6.79*  --     Assessment: 62yo female subtherapeutic on heparin with initial dosing for CAD while awaiting CABG.  Goal of Therapy:  Heparin level 0.3-0.7 units/ml   Plan:  Will increase heparin gtt by 3 units/kg/hr to 800 units/hr and check level in 6-8hr.  Vernard Gambles, PharmD, BCPS  07/23/2012,12:33 AM

## 2012-07-23 NOTE — Progress Notes (Signed)
ANTICOAGULATION CONSULT NOTE - Follow-up Consult  Pharmacy Consult for Heparin Indication: chest pain/ACS; CAD  No Known Allergies  Patient Measurements: Height: 5\' 1"  (154.9 cm) Weight: 111 lb 12.4 oz (50.7 kg) IBW/kg (Calculated) : 47.8 Heparin Dosing Weight:   Vital Signs: Temp: 98.5 F (36.9 C) (06/22 0730) Temp src: Oral (06/22 0730) BP: 170/98 mmHg (06/22 0732) Pulse Rate: 68 (06/22 0730)  Labs:  Recent Labs  07/21/12 2057 07/21/12 2125 07/21/12 2337 07/22/12 0730 07/22/12 1134 07/23/12 0001 07/23/12 0922  HGB 14.3 14.6 13.9 12.2  --   --  13.6  HCT 40.0 43.0 39.3 35.2*  --   --  40.3  PLT 195  --  217 169  --   --  175  APTT 26  --  64*  --   --   --   --   LABPROT 12.7  --  12.9  --   --   --   --   INR 0.96  --  0.98  --   --   --   --   HEPARINUNFRC  --   --   --   --   --  0.21* 0.38  CREATININE 0.86 1.10 0.79 0.71  --   --   --   TROPONINI  --   --  1.51* 6.80* 6.79*  --   --     Estimated Creatinine Clearance: 53.6 ml/min (by C-G formula based on Cr of 0.71).   Medical History: Past Medical History  Diagnosis Date  . Hypertension     Medications:  Scheduled:  . aspirin  81 mg Oral Daily  . atorvastatin  80 mg Oral q1800  . cloNIDine  0.1 mg Oral BID  . metoprolol tartrate  50 mg Oral BID   . sodium chloride 10 mL/hr at 07/22/12 1822  . heparin 800 Units/hr (07/23/12 0034)  . nitroGLYCERIN Stopped (07/22/12 1400)   Assessment:   Pt arrived as a Code STEMI.  We were asked to dose heparin before pt went to cath lab.  Heparin was turned off after cath, and Integrelin was to be continued x 18 hrs at 1 mcg/kg/min which ended at 1630 6/21. Platelet count fairly stable on Integrilin.  Noted plans for possible CABG.  Now that integrilin is off plan is to start heparin until CABG.  Heparin level now therapeutic on 800 units/hr.  No bleeding or complications noted.  CBC stable.  Goal of Therapy:  Monitor platelets by anticoagulation protocol:  Yes  HL 0.3-0.7  Plan: 1. Continue IV heparin at current rate. 2. Check heparin level in 8 hrs to confirm. 3. Daily heparin level and CBC.  Tad Moore, BCPS  Clinical Pharmacist Pager [phone]  07/23/2012 11:38 AM

## 2012-07-23 NOTE — Progress Notes (Signed)
ANTICOAGULATION CONSULT NOTE - Follow-up Consult  Pharmacy Consult for Heparin Indication: chest pain/ACS; CAD  No Known Allergies  Patient Measurements: Height: 5\' 1"  (154.9 cm) Weight: 111 lb 12.4 oz (50.7 kg) IBW/kg (Calculated) : 47.8 Heparin Dosing Weight:   Vital Signs: Temp: 97.9 F (36.6 C) (06/22 1700) Temp src: Oral (06/22 1700) BP: 177/87 mmHg (06/22 1940)  Labs:  Recent Labs  07/21/12 2057 07/21/12 2125 07/21/12 2337 07/22/12 0730 07/22/12 1134 07/23/12 0001 07/23/12 0922 07/23/12 1937  HGB 14.3 14.6 13.9 12.2  --   --  13.6  --   HCT 40.0 43.0 39.3 35.2*  --   --  40.3  --   PLT 195  --  217 169  --   --  175  --   APTT 26  --  64*  --   --   --   --   --   LABPROT 12.7  --  12.9  --   --   --   --   --   INR 0.96  --  0.98  --   --   --   --   --   HEPARINUNFRC  --   --   --   --   --  0.21* 0.38 0.51  CREATININE 0.86 1.10 0.79 0.71  --   --   --   --   TROPONINI  --   --  1.51* 6.80* 6.79*  --   --   --     Estimated Creatinine Clearance: 53.6 ml/min (by C-G formula based on Cr of 0.71).  Assessment:   Pt arrived as a Code STEMI.  We were asked to dose heparin before pt went to cath lab.  Heparin was turned off after cath, and Integrelin was to be continued x 18 hrs at 1 mcg/kg/min which ended at 1630 6/21. Platelet count fairly stable on Integrilin.  Noted plans for possible CABG.  Integrilin is now off, heparin to continue until CABG.  Repeat heparin level remains therapeutic on 800 units/hr.  No bleeding or complications noted.  CBC stable.  Goal of Therapy:  Monitor platelets by anticoagulation protocol: Yes  HL 0.3-0.7  Plan: 1. Continue IV heparin at current rate. 2. Daily heparin level and CBC.  Avital Dancy L. Illene Bolus, PharmD, BCPS Clinical Pharmacist Pager: [phone] Pharmacy: [phone] 07/23/2012 8:30 PM

## 2012-07-23 NOTE — Progress Notes (Signed)
Feels well  No CP or SOB  BP 170/98  Pulse 68  Temp(Src) 98.5 F (36.9 C) (Oral)  Resp 16  Ht 5\' 1"  (1.549 m)  Wt 111 lb 12.4 oz (50.7 kg)  BMI 21.13 kg/m2  SpO2 98%   Intake/Output Summary (Last 24 hours) at 07/23/12 1046 Last data filed at 07/23/12 0500  Gross per 24 hour  Intake 451.77 ml  Output   1050 ml  Net -598.23 ml   A & O x 3 Cardiac: RRR Lungs clear  Stable with medical therapy post MI For CABG on Tuesday Carotid duplex tomorrow

## 2012-07-24 ENCOUNTER — Inpatient Hospital Stay (HOSPITAL_COMMUNITY): Payer: Medicaid Other

## 2012-07-24 ENCOUNTER — Other Ambulatory Visit (HOSPITAL_COMMUNITY): Payer: Self-pay

## 2012-07-24 ENCOUNTER — Encounter (HOSPITAL_COMMUNITY): Payer: Self-pay | Admitting: Cardiology

## 2012-07-24 ENCOUNTER — Other Ambulatory Visit: Payer: Self-pay | Admitting: *Deleted

## 2012-07-24 DIAGNOSIS — I2510 Atherosclerotic heart disease of native coronary artery without angina pectoris: Secondary | ICD-10-CM

## 2012-07-24 DIAGNOSIS — E785 Hyperlipidemia, unspecified: Secondary | ICD-10-CM

## 2012-07-24 DIAGNOSIS — Z01810 Encounter for preprocedural cardiovascular examination: Secondary | ICD-10-CM

## 2012-07-24 DIAGNOSIS — I213 ST elevation (STEMI) myocardial infarction of unspecified site: Secondary | ICD-10-CM

## 2012-07-24 HISTORY — DX: Hyperlipidemia, unspecified: E78.5

## 2012-07-24 LAB — CBC
Platelets: 175 10*3/uL (ref 150–400)
RBC: 3.95 MIL/uL (ref 3.87–5.11)
RDW: 14.3 % (ref 11.5–15.5)
WBC: 6.3 10*3/uL (ref 4.0–10.5)

## 2012-07-24 LAB — BASIC METABOLIC PANEL
BUN: 12 mg/dL (ref 6–23)
Calcium: 8.9 mg/dL (ref 8.4–10.5)
Chloride: 106 mEq/L (ref 96–112)
Creatinine, Ser: 0.72 mg/dL (ref 0.50–1.10)
GFR calc Af Amer: >90 mL/min (ref >90)

## 2012-07-24 LAB — URINALYSIS, ROUTINE W REFLEX MICROSCOPIC
Bilirubin Urine: NEGATIVE
Hgb urine dipstick: NEGATIVE
Specific Gravity, Urine: 1.007 (ref 1.005–1.030)
Urobilinogen, UA: 0.2 mg/dL (ref 0.0–1.0)

## 2012-07-24 LAB — SURGICAL PCR SCREEN
MRSA, PCR: NEGATIVE
Staphylococcus aureus: NEGATIVE

## 2012-07-24 LAB — POCT I-STAT 3, ART BLOOD GAS (G3+)
O2 Saturation: 97 %
pCO2 arterial: 35.2 mmHg (ref 35.0–45.0)
pO2, Arterial: 83 mmHg (ref 80.0–100.0)

## 2012-07-24 LAB — POCT ACTIVATED CLOTTING TIME: Activated Clotting Time: 196 seconds

## 2012-07-24 LAB — POCT I-STAT, CHEM 8
BUN: 13 mg/dL (ref 6–23)
Chloride: 103 mEq/L (ref 96–112)
Potassium: 3.2 mEq/L — ABNORMAL LOW (ref 3.5–5.1)
Sodium: 141 mEq/L (ref 135–145)

## 2012-07-24 LAB — HEPARIN LEVEL (UNFRACTIONATED): Heparin Unfractionated: 0.49 IU/mL (ref 0.30–0.70)

## 2012-07-24 MED ORDER — DEXMEDETOMIDINE HCL IN NACL 400 MCG/100ML IV SOLN
0.1000 ug/kg/h | INTRAVENOUS | Status: AC
  Administered 2012-07-25: 0.3 ug/kg/h via INTRAVENOUS
  Filled 2012-07-24: qty 100

## 2012-07-24 MED ORDER — POTASSIUM CHLORIDE 2 MEQ/ML IV SOLN
80.0000 meq | INTRAVENOUS | Status: DC
  Filled 2012-07-24: qty 40

## 2012-07-24 MED ORDER — VANCOMYCIN HCL 1000 MG IV SOLR
1000.0000 mg | INTRAVENOUS | Status: AC
  Administered 2012-07-25: 1000 mg via INTRAVENOUS
  Filled 2012-07-24: qty 1000

## 2012-07-24 MED ORDER — DIAZEPAM 2 MG PO TABS
2.0000 mg | ORAL_TABLET | Freq: Once | ORAL | Status: AC
  Administered 2012-07-25: 2 mg via ORAL
  Filled 2012-07-24: qty 1

## 2012-07-24 MED ORDER — SODIUM CHLORIDE 0.9 % IV SOLN
INTRAVENOUS | Status: DC
  Filled 2012-07-24: qty 30

## 2012-07-24 MED ORDER — CHLORHEXIDINE GLUCONATE 4 % EX LIQD
60.0000 mL | Freq: Once | CUTANEOUS | Status: AC
  Administered 2012-07-25: 4 via TOPICAL

## 2012-07-24 MED ORDER — PHENYLEPHRINE HCL 10 MG/ML IJ SOLN
30.0000 ug/min | INTRAVENOUS | Status: AC
  Administered 2012-07-25: 10 ug/min via INTRAVENOUS
  Filled 2012-07-24: qty 2

## 2012-07-24 MED ORDER — METOPROLOL TARTRATE 12.5 MG HALF TABLET
12.5000 mg | ORAL_TABLET | Freq: Once | ORAL | Status: AC
  Administered 2012-07-25: 12.5 mg via ORAL
  Filled 2012-07-24: qty 1

## 2012-07-24 MED ORDER — PAPAVERINE HCL 30 MG/ML IJ SOLN
INTRAMUSCULAR | Status: AC
  Administered 2012-07-25: 15:00:00
  Filled 2012-07-24: qty 2.5

## 2012-07-24 MED ORDER — DOPAMINE-DEXTROSE 3.2-5 MG/ML-% IV SOLN
2.0000 ug/kg/min | INTRAVENOUS | Status: DC
  Filled 2012-07-24: qty 250

## 2012-07-24 MED ORDER — EPINEPHRINE HCL 1 MG/ML IJ SOLN
0.5000 ug/min | INTRAMUSCULAR | Status: DC
  Filled 2012-07-24: qty 4

## 2012-07-24 MED ORDER — SODIUM CHLORIDE 0.9 % IV SOLN
INTRAVENOUS | Status: AC
  Administered 2012-07-25: .8 [IU]/h via INTRAVENOUS
  Filled 2012-07-24: qty 1

## 2012-07-24 MED ORDER — TEMAZEPAM 15 MG PO CAPS
15.0000 mg | ORAL_CAPSULE | Freq: Once | ORAL | Status: AC | PRN
  Administered 2012-07-24: 15 mg via ORAL
  Filled 2012-07-24: qty 1

## 2012-07-24 MED ORDER — AMLODIPINE BESYLATE 5 MG PO TABS
5.0000 mg | ORAL_TABLET | Freq: Every day | ORAL | Status: DC
  Administered 2012-07-24 – 2012-07-25 (×2): 5 mg via ORAL
  Filled 2012-07-24 (×2): qty 1

## 2012-07-24 MED ORDER — NITROGLYCERIN IN D5W 200-5 MCG/ML-% IV SOLN
2.0000 ug/min | INTRAVENOUS | Status: DC
  Filled 2012-07-24: qty 250

## 2012-07-24 MED ORDER — DEXTROSE 5 % IV SOLN
750.0000 mg | INTRAVENOUS | Status: DC
  Filled 2012-07-24: qty 750

## 2012-07-24 MED ORDER — DEXTROSE 5 % IV SOLN
1.5000 g | INTRAVENOUS | Status: AC
  Administered 2012-07-25: 1.5 g via INTRAVENOUS
  Administered 2012-07-25: .75 g via INTRAVENOUS
  Filled 2012-07-24: qty 1.5

## 2012-07-24 MED ORDER — MAGNESIUM SULFATE 50 % IJ SOLN
40.0000 meq | INTRAMUSCULAR | Status: DC
  Filled 2012-07-24: qty 10

## 2012-07-24 MED ORDER — SODIUM CHLORIDE 0.9 % IV SOLN
INTRAVENOUS | Status: AC
  Administered 2012-07-25: 69.8 mL/h via INTRAVENOUS
  Filled 2012-07-24: qty 40

## 2012-07-24 NOTE — Care Management Note (Addendum)
    Page 1 of 2   07/28/2012     2:39:56 PM   CARE MANAGEMENT NOTE 07/28/2012  Patient:  [name], [name]   Account Number:  [id number]  Date Initiated:  07/24/2012  Documentation initiated by:  Junius Creamer  Subjective/Objective Assessment:   adm w mi     Action/Plan:   lives alone, pt states has pcp in [place], did leave pt prim care resourse list and prescription disocunt card.   Anticipated DC Date:  07/31/2012   Anticipated DC Plan:  HOME W HOME HEALTH SERVICES      DC Planning Services  CM consult      Choice offered to / List presented to:     DME arranged  Levan Hurst      DME agency  [facility]        Status of service:   Medicare Important Message given?   (If response is "NO", the following Medicare IM given date fields will be blank) Date Medicare IM given:   Date Additional Medicare IM given:    Discharge Disposition:    Per UR Regulation:  Reviewed for med. necessity/level of care/duration of stay  If discussed at Long Length of Stay Meetings, dates discussed:    Comments:  ContactTrish Mage Sister     [phone]                 [name],[name] Mother [phone]  07/28/12 [name] [name] [phone] PT REQUESTS RW FOR HOME.  REFERRAL TO [facility] FOR DME NEEDS. DENIES ANY OTHER NEEDS FOR HOME.  FAMILY WILL STILL PROVIDE 24HR CARE AT DC.  07-27-12 [name] [name], RNBSN [phone] Patient sitting up in bed.  States lives at home with friend but plan for discharge is to go home to her Mothers house who will be assisting in care with her sister and daughter.  Between the three of them there will be 24/7 care.  CM will continue to follow for further needs.   6/23 1118 [name] dowell rn,bsn pt states her pcp is in [place].

## 2012-07-24 NOTE — Progress Notes (Signed)
[facility]  DAILY PROGRESS NOTE   Subjective:  No angina at rest or with walking to the bathroom and across the unit. No dyspnea at rest or with exertion. Over the overnight  Objective:  Temp:  [97.9 F (36.6 C)-98.7 F (37.1 C)] 98.4 F (36.9 C) (06/23 0740) Pulse Rate:  [60] 60 (06/23 0740) Resp:  [16-17] 16 (06/23 0740) BP: (168-199)/(76-98) 183/95 mmHg (06/23 0740) SpO2:  [96 %-98 %] 97 % (06/23 0740) Weight:  [51 kg (112 lb 7 oz)] 51 kg (112 lb 7 oz) (06/23 0500) Weight change:   Intake/Output from previous day: 06/22 0701 - 06/23 0700 In: 2032 [P.O.:1600; I.V.:432] Out: 1450 [Urine:1450]  Intake/Output from this shift: Total I/O In: 516 [P.O.:480; I.V.:36] Out: 450 [Urine:450]  Medications: Current Facility-Administered Medications  Medication Dose Route Frequency Provider Last Rate Last Dose  . 0.9 %  sodium chloride infusion   Intravenous Continuous [name], MD 10 mL/hr at 07/22/12 1822    . acetaminophen (TYLENOL) tablet 650 mg  650 mg Oral Q4H PRN [name], MD   650 mg at 07/24/12 0910  . ALPRAZolam Prudy Feeler) tablet 0.25-0.5 mg  0.25-0.5 mg Oral Q4H PRN Loreli Slot, MD   0.25 mg at 07/23/12 2130  . aspirin chewable tablet 81 mg  81 mg Oral Daily [name], MD   81 mg at 07/24/12 0910  . atorvastatin (LIPITOR) tablet 80 mg  80 mg Oral q1800 Leeann Must, MD   80 mg at 07/23/12 1848  . bisacodyl (DULCOLAX) EC tablet 5 mg  5 mg Oral Once Loreli Slot, MD      . chlorhexidine (HIBICLENS) 4 % liquid 4 application  60 mL Topical Once Loreli Slot, MD      . chlorhexidine (HIBICLENS) 4 % liquid 4 application  60 mL Topical Once Loreli Slot, MD      . cloNIDine (CATAPRES) tablet 0.1 mg  0.1 mg Oral BID Leeann Must, MD   0.1 mg at 07/24/12 4098  . diazepam (VALIUM) tablet 2 mg  2 mg Oral Once Loreli Slot, MD      . Melene Muller ON 07/25/2012] diazepam (VALIUM) tablet 2 mg  2 mg Oral Once  Loreli Slot, MD      . heparin ADULT infusion 100 units/mL (25000 units/250 mL)  800 Units/hr Intravenous Continuous Colleen Can, [facility] 8 mL/hr at 07/23/12 2132 800 Units/hr at 07/23/12 2132  . metoprolol (LOPRESSOR) tablet 50 mg  50 mg Oral BID [name], MD   50 mg at 07/24/12 0909  . [START ON 07/25/2012] metoprolol tartrate (LOPRESSOR) tablet 12.5 mg  12.5 mg Oral Once Loreli Slot, MD      . nitroGLYCERIN (NITROSTAT) SL tablet 0.4 mg  0.4 mg Sublingual Q5 Min x 3 PRN Leeann Must, MD      . nitroGLYCERIN 0.2 mg/mL in dextrose 5 % infusion  3-30 mcg/min Intravenous Titrated Leeann Must, MD   5 mcg/min at 07/22/12 1200  . ondansetron (ZOFRAN) injection 4 mg  4 mg Intravenous Q6H PRN [name], MD        Physical Exam: General appearance: alert and no distress  Neck: no adenopathy, no carotid bruit, no JVD, supple, symmetrical, trachea midline and thyroid not enlarged, symmetric, no tenderness/mass/nodules  Lungs: clear to auscultation bilaterally  Heart: regular rate and rhythm, S1, S2 normal, no murmur, click, rub or gallop  Extremities: extremities normal, atraumatic, no  cyanosis or edema and Right groin OK   Lab Results: Results for orders placed during the hospital encounter of 07/21/12 (from the past 48 hour(s))  TROPONIN I     Status: Abnormal   Collection Time    07/22/12 11:34 AM      Result Value Range   Troponin I 6.79 (*) <0.30 ng/mL   Comment:            Due to the release kinetics of cTnI,     a negative result within the first hours     of the onset of symptoms does not rule out     myocardial infarction with certainty.     If myocardial infarction is still suspected,     repeat the test at appropriate intervals.     CRITICAL VALUE NOTED.  VALUE IS CONSISTENT WITH PREVIOUSLY REPORTED AND CALLED VALUE.  HEPARIN LEVEL (UNFRACTIONATED)     Status: Abnormal   Collection Time    07/23/12 12:01 AM      Result Value Range   Heparin  Unfractionated 0.21 (*) 0.30 - 0.70 IU/mL   Comment:            IF HEPARIN RESULTS ARE BELOW     EXPECTED VALUES, AND PATIENT     DOSAGE HAS BEEN CONFIRMED,     SUGGEST FOLLOW UP TESTING     OF ANTITHROMBIN III LEVELS.  HEPARIN LEVEL (UNFRACTIONATED)     Status: None   Collection Time    07/23/12  9:22 AM      Result Value Range   Heparin Unfractionated 0.38  0.30 - 0.70 IU/mL   Comment:            IF HEPARIN RESULTS ARE BELOW     EXPECTED VALUES, AND PATIENT     DOSAGE HAS BEEN CONFIRMED,     SUGGEST FOLLOW UP TESTING     OF ANTITHROMBIN III LEVELS.  CBC     Status: None   Collection Time    07/23/12  9:22 AM      Result Value Range   WBC 6.1  4.0 - 10.5 K/uL   RBC 4.23  3.87 - 5.11 MIL/uL   Hemoglobin 13.6  12.0 - 15.0 g/dL   HCT 16.1  09.6 - 04.5 %   MCV 95.3  78.0 - 100.0 fL   MCH 32.2  26.0 - 34.0 pg   MCHC 33.7  30.0 - 36.0 g/dL   RDW 40.9  81.1 - 91.4 %   Platelets 175  150 - 400 K/uL  HEPARIN LEVEL (UNFRACTIONATED)     Status: None   Collection Time    07/23/12  7:37 PM      Result Value Range   Heparin Unfractionated 0.51  0.30 - 0.70 IU/mL   Comment:            IF HEPARIN RESULTS ARE BELOW     EXPECTED VALUES, AND PATIENT     DOSAGE HAS BEEN CONFIRMED,     SUGGEST FOLLOW UP TESTING     OF ANTITHROMBIN III LEVELS.  HEPARIN LEVEL (UNFRACTIONATED)     Status: None   Collection Time    07/24/12  4:37 AM      Result Value Range   Heparin Unfractionated 0.49  0.30 - 0.70 IU/mL   Comment:            IF HEPARIN RESULTS ARE BELOW     EXPECTED VALUES, AND PATIENT     DOSAGE  HAS BEEN CONFIRMED,     SUGGEST FOLLOW UP TESTING     OF ANTITHROMBIN III LEVELS.  CBC     Status: None   Collection Time    07/24/12  4:37 AM      Result Value Range   WBC 6.3  4.0 - 10.5 K/uL   RBC 3.95  3.87 - 5.11 MIL/uL   Hemoglobin 12.8  12.0 - 15.0 g/dL   HCT 96.0  45.4 - 09.8 %   MCV 94.2  78.0 - 100.0 fL   MCH 32.4  26.0 - 34.0 pg   MCHC 34.4  30.0 - 36.0 g/dL   RDW 11.9   14.7 - 82.9 %   Platelets 175  150 - 400 K/uL  TYPE AND SCREEN     Status: None   Collection Time    07/24/12  4:37 AM      Result Value Range   ABO/RH(D) O POS     Antibody Screen NEG     Sample Expiration 07/27/2012    BASIC METABOLIC PANEL     Status: Abnormal   Collection Time    07/24/12  4:37 AM      Result Value Range   Sodium 140  135 - 145 mEq/L   Potassium 4.4  3.5 - 5.1 mEq/L   Chloride 106  96 - 112 mEq/L   CO2 25  19 - 32 mEq/L   Glucose, Bld 82  70 - 99 mg/dL   BUN 12  6 - 23 mg/dL   Creatinine, Ser 5.62  0.50 - 1.10 mg/dL   Calcium 8.9  8.4 - 13.0 mg/dL   GFR calc non Af Amer 89 (*) >90 mL/min   GFR calc Af Amer >90  >90 mL/min   Comment:            The eGFR has been calculated     using the CKD EPI equation.     This calculation has not been     validated in all clinical     situations.     eGFR's persistently     <90 mL/min signify     possible Chronic Kidney Disease.  ABO/RH     Status: None   Collection Time    07/24/12  4:37 AM      Result Value Range   ABO/RH(D) O POS    URINALYSIS, ROUTINE W REFLEX MICROSCOPIC     Status: None   Collection Time    07/24/12  6:06 AM      Result Value Range   Color, Urine YELLOW  YELLOW   APPearance CLEAR  CLEAR   Specific Gravity, Urine 1.007  1.005 - 1.030   pH 6.0  5.0 - 8.0   Glucose, UA NEGATIVE  NEGATIVE mg/dL   Hgb urine dipstick NEGATIVE  NEGATIVE   Bilirubin Urine NEGATIVE  NEGATIVE   Ketones, ur NEGATIVE  NEGATIVE mg/dL   Protein, ur NEGATIVE  NEGATIVE mg/dL   Urobilinogen, UA 0.2  0.0 - 1.0 mg/dL   Nitrite NEGATIVE  NEGATIVE   Leukocytes, UA NEGATIVE  NEGATIVE   Comment: MICROSCOPIC NOT DONE ON URINES WITH NEGATIVE PROTEIN, BLOOD, LEUKOCYTES, NITRITE, OR GLUCOSE <1000 mg/dL.    Imaging: Imaging results have been reviewed and Dg Chest 2 View  07/24/2012   *RADIOLOGY REPORT*  Clinical Data: Pre CABG.  CHEST - 2 VIEW  Comparison: None.  Findings: Trachea is midline.  Heart size normal.  Lungs  appear mildly hyperinflated but otherwise clear.  No pleural fluid.  IMPRESSION: No acute findings.   Original Report Authenticated By: Leanna Battles, M.D.    Assessment:  1. Active Problems: 2.   STEMI (ST elevation myocardial infarction) 3.   Essential hypertension 4.   Tobacco abuse 5.   Plan:  1. Spent greater than 15 minutes discussing the importance of smoking cessation as well as different methods to achieve this goal. This will be the perfect opportunity to quit smoking permanently. 2. Coronary bypass surgery tomorrow morning 3. We'll keep on heparin in the interim 4. Plan to add an ACE inhibitor/angiotensin receptor blocker after surgery. For the time being we'll add a calcium channel blocker for the elevated blood pressure  Time Spent Directly with Patient:  25 minutes  Length of Stay:  LOS: 3 days    [name] [name] 07/24/2012, 9:39 AM

## 2012-07-24 NOTE — Progress Notes (Signed)
CARDIAC REHAB PHASE I   PRE:  Rate/Rhythm: 63 SR  BP:  Supine:   Sitting: 111/76  Standing:    SaO2:   MODE:  Ambulation: 700 ft   POST:  Rate/Rhythm: 78 SR  BP:  Supine:   Sitting: 172/85  Standing:    SaO2:  1340-1420 Assisted X 1 to ambulate. Gait steady. BP after walk elevated 172/85. Discussed with pt sternal precautions, use of IS and ambulation post-op. Pt voices understanding. She has difficultly with memory and has expressive aphagia. She states that she thinks that she had a stroke 03/2012 but did not seek medical attention. Pt to recliner after walk with call light in reach. Encouraged pt to watch going for heart surgery video and instructed her how to get it on TV.  Melina Copa RN 07/24/2012 2:22 PM

## 2012-07-24 NOTE — Progress Notes (Signed)
3 Days Post-Op Procedure(s) (LRB): LEFT HEART CATHETERIZATION WITH CORONARY ANGIOGRAM (N/A) Subjective: No complaints  Objective: Vital signs in last 24 hours: Temp:  [97.9 F (36.6 C)-98.5 F (36.9 C)] 98 F (36.7 C) (06/23 1200) Pulse Rate:  [60] 60 (06/23 0740) Cardiac Rhythm:  [-] Normal sinus rhythm (06/23 0800) Resp:  [16-18] 18 (06/23 1200) BP: (146-199)/(76-98) 146/86 mmHg (06/23 1200) SpO2:  [97 %-99 %] 99 % (06/23 1200) Weight:  [112 lb 7 oz (51 kg)] 112 lb 7 oz (51 kg) (06/23 0500)  Hemodynamic parameters for last 24 hours:    Intake/Output from previous day: 06/22 0701 - 06/23 0700 In: 2032 [P.O.:1600; I.V.:432] Out: 1450 [Urine:1450] Intake/Output this shift: Total I/O In: 642 [P.O.:480; I.V.:162] Out: 450 [Urine:450]  General appearance: alert and no distress Neurologic: intact Heart: regular rate and rhythm Lungs: clear to auscultation bilaterally  Lab Results:  Recent Labs  07/23/12 0922 07/24/12 0437  WBC 6.1 6.3  HGB 13.6 12.8  HCT 40.3 37.2  PLT 175 175   BMET:  Recent Labs  07/22/12 0730 07/24/12 0437  NA 140 140  K 4.1 4.4  CL 109 106  CO2 22 25  GLUCOSE 98 82  BUN 11 12  CREATININE 0.71 0.72  CALCIUM 8.7 8.9    PT/INR:  Recent Labs  07/21/12 2337  LABPROT 12.9  INR 0.98   ABG    Component Value Date/Time   PHART 7.449 07/24/2012 1151   HCO3 24.4* 07/24/2012 1151   TCO2 25 07/24/2012 1151   O2SAT 97.0 07/24/2012 1151   CBG (last 3)  No results found for this basename: GLUCAP,  in the last 72 hours  Assessment/Plan: S/P Procedure(s) (LRB): LEFT HEART CATHETERIZATION WITH CORONARY ANGIOGRAM (N/A) For CABG tomorrow Carotid duplex showed no significant stenosis  PFT OK FEV1 1.64 (95%)  All questions answered   LOS: 3 days    Xiao Graul C 07/24/2012

## 2012-07-24 NOTE — Progress Notes (Signed)
ANTICOAGULATION CONSULT NOTE - Follow-up Consult  Pharmacy Consult:  Heparin Indication: chest pain/ACS; CAD  No Known Allergies  Patient Measurements: Height: 5\' 1"  (154.9 cm) Weight: 112 lb 7 oz (51 kg) IBW/kg (Calculated) : 47.8 Heparin Dosing Weight: 51kg  Vital Signs: Temp: 98.4 F (36.9 C) (06/23 0740) Temp src: Oral (06/23 0740) BP: 183/95 mmHg (06/23 0740) Pulse Rate: 60 (06/23 0740)  Labs:  Recent Labs  07/21/12 2057  07/21/12 2337 07/22/12 0730 07/22/12 1134  07/23/12 0922 07/23/12 1937 07/24/12 0437  HGB 14.3  < > 13.9 12.2  --   --  13.6  --  12.8  HCT 40.0  < > 39.3 35.2*  --   --  40.3  --  37.2  PLT 195  --  217 169  --   --  175  --  175  APTT 26  --  64*  --   --   --   --   --   --   LABPROT 12.7  --  12.9  --   --   --   --   --   --   INR 0.96  --  0.98  --   --   --   --   --   --   HEPARINUNFRC  --   --   --   --   --   < > 0.38 0.51 0.49  CREATININE 0.86  < > 0.79 0.71  --   --   --   --  0.72  TROPONINI  --   --  1.51* 6.80* 6.79*  --   --   --   --   < > = values in this interval not displayed.  Estimated Creatinine Clearance: 53.6 ml/min (by C-G formula based on Cr of 0.72).      Assessment:  72 YOF arrived as a Code STEMI.  Now s/p cath and heparin infusion to continue while awaiting CABG.  Heparin level therapeutic; no bleeding reported.   Goal of Therapy:  HL 0.3-0.7 Monitor platelets by anticoagulation protocol: Yes   Plan: - Heparin gtt at 800 units/hr - Daily  HL / CBC - F/U post CABG     Jakyren Fluegge D. Laney Potash, PharmD, BCPS Pager:  [phone] 07/24/2012, 9:45 AM

## 2012-07-24 NOTE — Progress Notes (Signed)
PFT done at bedside. Unconfirmed copy placed in shadow chart.  Inocente Salles RRT, RCP 07/24/2012 11:48 AM

## 2012-07-24 NOTE — Progress Notes (Signed)
Pre-op Cardiac Surgery  Carotid Findings:  There is no obvious evidence of hemodynamically significant internal carotid artery stenosis. Vertebral arteries are patent with antegrade flow.   07/24/2012 11:21 AM Marcelino Duster Twana Wileman, RVT, RDCS, RDMS   Limb dopplers have not been completed yet. Upper Extremity Right Left  Brachial Pressures    Radial Waveforms    Ulnar Waveforms    Palmar Arch (Allen's Test)     Findings:      Lower  Extremity Right Left  Dorsalis Pedis    Anterior Tibial    Posterior Tibial    Ankle/Brachial Indices      Findings:

## 2012-07-25 ENCOUNTER — Inpatient Hospital Stay (HOSPITAL_COMMUNITY): Payer: Medicaid Other | Admitting: Anesthesiology

## 2012-07-25 ENCOUNTER — Encounter (HOSPITAL_COMMUNITY): Payer: Self-pay

## 2012-07-25 ENCOUNTER — Inpatient Hospital Stay (HOSPITAL_COMMUNITY): Payer: Medicaid Other

## 2012-07-25 ENCOUNTER — Encounter (HOSPITAL_COMMUNITY)
Admission: EM | Disposition: A | Discharge status: Home Health | Payer: Self-pay | Source: Home / Self Care | Attending: Cardiovascular Disease

## 2012-07-25 ENCOUNTER — Other Ambulatory Visit: Payer: Self-pay

## 2012-07-25 ENCOUNTER — Encounter (HOSPITAL_COMMUNITY): Payer: Self-pay | Admitting: Anesthesiology

## 2012-07-25 DIAGNOSIS — E785 Hyperlipidemia, unspecified: Secondary | ICD-10-CM

## 2012-07-25 DIAGNOSIS — Z01810 Encounter for preprocedural cardiovascular examination: Secondary | ICD-10-CM

## 2012-07-25 DIAGNOSIS — I2510 Atherosclerotic heart disease of native coronary artery without angina pectoris: Secondary | ICD-10-CM

## 2012-07-25 DIAGNOSIS — F17200 Nicotine dependence, unspecified, uncomplicated: Secondary | ICD-10-CM

## 2012-07-25 HISTORY — PX: CORONARY ARTERY BYPASS GRAFT: SHX141

## 2012-07-25 LAB — POCT I-STAT 4, (NA,K, GLUC, HGB,HCT)
Glucose, Bld: 77 mg/dL (ref 70–99)
Glucose, Bld: 96 mg/dL (ref 70–99)
Glucose, Bld: 97 mg/dL (ref 70–99)
Glucose, Bld: 88 mg/dL (ref 70–99)
HCT: 23 % — ABNORMAL LOW (ref 36.0–46.0)
HCT: 31 % — ABNORMAL LOW (ref 36.0–46.0)
HCT: 25 % — ABNORMAL LOW (ref 36.0–46.0)
HCT: 29 % — ABNORMAL LOW (ref 36.0–46.0)
Hemoglobin: 11.9 g/dL — ABNORMAL LOW (ref 12.0–15.0)
Hemoglobin: 7.8 g/dL — ABNORMAL LOW (ref 12.0–15.0)
Hemoglobin: 8.5 g/dL — ABNORMAL LOW (ref 12.0–15.0)
Hemoglobin: 8.2 g/dL — ABNORMAL LOW (ref 12.0–15.0)
Potassium: 3.4 meq/L — ABNORMAL LOW (ref 3.5–5.1)
Potassium: 3.5 mEq/L (ref 3.5–5.1)
Potassium: 5.2 meq/L — ABNORMAL HIGH (ref 3.5–5.1)
Potassium: 3.4 mEq/L — ABNORMAL LOW (ref 3.5–5.1)
Sodium: 141 mEq/L (ref 135–145)
Sodium: 137 meq/L (ref 135–145)
Sodium: 142 mEq/L (ref 135–145)
Sodium: 138 meq/L (ref 135–145)
Sodium: 137 mEq/L (ref 135–145)

## 2012-07-25 LAB — POCT I-STAT 3, ART BLOOD GAS (G3+)
Acid-Base Excess: 1 mmol/L (ref 0.0–2.0)
Bicarbonate: 25.6 meq/L — ABNORMAL HIGH (ref 20.0–24.0)
O2 Saturation: 100 %
O2 Saturation: 98 %
Patient temperature: 36
TCO2: 27 mmol/L (ref 0–100)
pCO2 arterial: 39.6 mmHg (ref 35.0–45.0)
pCO2 arterial: 27.8 mmHg — ABNORMAL LOW (ref 35.0–45.0)
pH, Arterial: 7.418 (ref 7.350–7.450)
pH, Arterial: 7.484 — ABNORMAL HIGH (ref 7.350–7.450)
pO2, Arterial: 406 mmHg — ABNORMAL HIGH (ref 80.0–100.0)

## 2012-07-25 LAB — CBC
Hemoglobin: 9.8 g/dL — ABNORMAL LOW (ref 12.0–15.0)
MCH: 32.2 pg (ref 26.0–34.0)
Platelets: 188 10*3/uL (ref 150–400)
Platelets: 80 10*3/uL — ABNORMAL LOW (ref 150–400)
RBC: 3.95 MIL/uL (ref 3.87–5.11)
RBC: 3.04 MIL/uL — ABNORMAL LOW (ref 3.87–5.11)
WBC: 6.2 10*3/uL (ref 4.0–10.5)
WBC: 6.3 10*3/uL (ref 4.0–10.5)

## 2012-07-25 LAB — POCT I-STAT GLUCOSE
Glucose, Bld: 87 mg/dL (ref 70–99)
Operator id: 3406

## 2012-07-25 LAB — BASIC METABOLIC PANEL
BUN: 16 mg/dL (ref 6–23)
Chloride: 106 mEq/L (ref 96–112)
GFR calc Af Amer: >90 mL/min (ref >90)
GFR calc non Af Amer: >90 mL/min (ref >90)
Potassium: 4.1 mEq/L (ref 3.5–5.1)
Sodium: 141 mEq/L (ref 135–145)

## 2012-07-25 LAB — HEMOGLOBIN AND HEMATOCRIT, BLOOD
HCT: 24.5 % — ABNORMAL LOW (ref 36.0–46.0)
Hemoglobin: 8.6 g/dL — ABNORMAL LOW (ref 12.0–15.0)

## 2012-07-25 LAB — PROTIME-INR
INR: 1.48 (ref 0.00–1.49)
Prothrombin Time: 17.5 seconds — ABNORMAL HIGH (ref 11.6–15.2)

## 2012-07-25 SURGERY — CORONARY ARTERY BYPASS GRAFTING (CABG)
Anesthesia: General | Site: Chest | Wound class: Clean

## 2012-07-25 MED ORDER — VANCOMYCIN HCL IN DEXTROSE 1-5 GM/200ML-% IV SOLN
1000.0000 mg | Freq: Once | INTRAVENOUS | Status: AC
  Administered 2012-07-26: 1000 mg via INTRAVENOUS
  Filled 2012-07-25: qty 200

## 2012-07-25 MED ORDER — ACETAMINOPHEN 500 MG PO TABS
1000.0000 mg | ORAL_TABLET | Freq: Four times a day (QID) | ORAL | Status: AC
  Administered 2012-07-26 – 2012-07-30 (×15): 1000 mg via ORAL
  Filled 2012-07-25 (×19): qty 2

## 2012-07-25 MED ORDER — SODIUM CHLORIDE 0.9 % IJ SOLN: 3.0000 mL | INTRAMUSCULAR | Status: DC | PRN

## 2012-07-25 MED ORDER — METOPROLOL TARTRATE 25 MG/10 ML ORAL SUSPENSION
12.5000 mg | Freq: Two times a day (BID) | ORAL | Status: DC
  Filled 2012-07-25 (×5): qty 5

## 2012-07-25 MED ORDER — MIDAZOLAM HCL 2 MG/2ML IJ SOLN
2.0000 mg | INTRAMUSCULAR | Status: DC | PRN
  Administered 2012-07-26: 2 mg via INTRAVENOUS
  Filled 2012-07-25: qty 2

## 2012-07-25 MED ORDER — MIDAZOLAM HCL 5 MG/5ML IJ SOLN
INTRAMUSCULAR | Status: DC | PRN
  Administered 2012-07-25 (×2): 5 mg via INTRAVENOUS

## 2012-07-25 MED ORDER — ACETAMINOPHEN 160 MG/5ML PO SOLN
975.0000 mg | Freq: Four times a day (QID) | ORAL | Status: AC
  Filled 2012-07-25: qty 40.6

## 2012-07-25 MED ORDER — FENTANYL CITRATE 0.05 MG/ML IJ SOLN
INTRAMUSCULAR | Status: AC
  Filled 2012-07-25: qty 2

## 2012-07-25 MED ORDER — LACTATED RINGERS IV SOLN
INTRAVENOUS | Status: DC | PRN
  Administered 2012-07-25: 14:00:00 via INTRAVENOUS

## 2012-07-25 MED ORDER — FAMOTIDINE IN NACL 20-0.9 MG/50ML-% IV SOLN
20.0000 mg | Freq: Two times a day (BID) | INTRAVENOUS | Status: AC
  Administered 2012-07-25 – 2012-07-26 (×2): 20 mg via INTRAVENOUS
  Filled 2012-07-25 (×2): qty 50

## 2012-07-25 MED ORDER — LACTATED RINGERS IV SOLN: 500.0000 mL | Freq: Once | INTRAVENOUS | Status: AC | PRN

## 2012-07-25 MED ORDER — MAGNESIUM SULFATE 40 MG/ML IJ SOLN
INTRAMUSCULAR | Status: AC
  Filled 2012-07-25: qty 100

## 2012-07-25 MED ORDER — ASPIRIN 81 MG PO CHEW
324.0000 mg | CHEWABLE_TABLET | Freq: Every day | ORAL | Status: DC
  Administered 2012-07-31: 324 mg
  Filled 2012-07-25: qty 3
  Filled 2012-07-25: qty 1

## 2012-07-25 MED ORDER — ARTIFICIAL TEARS OP OINT
TOPICAL_OINTMENT | OPHTHALMIC | Status: DC | PRN
  Administered 2012-07-25: 1 via OPHTHALMIC

## 2012-07-25 MED ORDER — MORPHINE SULFATE 2 MG/ML IJ SOLN: 1.0000 mg | INTRAMUSCULAR | Status: AC | PRN

## 2012-07-25 MED ORDER — FENTANYL CITRATE 0.05 MG/ML IJ SOLN
INTRAMUSCULAR | Status: DC | PRN
  Administered 2012-07-25 (×2): 500 ug via INTRAVENOUS
  Administered 2012-07-25 (×2): 250 ug via INTRAVENOUS
  Administered 2012-07-25 (×2): 500 ug via INTRAVENOUS

## 2012-07-25 MED ORDER — ACETAMINOPHEN 10 MG/ML IV SOLN
1000.0000 mg | Freq: Once | INTRAVENOUS | Status: AC
  Administered 2012-07-25: 1000 mg via INTRAVENOUS

## 2012-07-25 MED ORDER — SODIUM CHLORIDE 0.9 % IV SOLN
INTRAVENOUS | Status: DC
  Filled 2012-07-25: qty 40

## 2012-07-25 MED ORDER — SODIUM CHLORIDE 0.9 % IJ SOLN
OROMUCOSAL | Status: DC | PRN
  Administered 2012-07-25 (×3): via TOPICAL

## 2012-07-25 MED ORDER — PROPOFOL 10 MG/ML IV BOLUS
INTRAVENOUS | Status: DC | PRN
  Administered 2012-07-25: 50 mg via INTRAVENOUS

## 2012-07-25 MED ORDER — MORPHINE SULFATE 2 MG/ML IJ SOLN
2.0000 mg | INTRAMUSCULAR | Status: DC | PRN
  Administered 2012-07-25 – 2012-07-26 (×4): 2 mg via INTRAVENOUS
  Administered 2012-07-27: 4 mg via INTRAVENOUS
  Filled 2012-07-25 (×2): qty 1
  Filled 2012-07-25: qty 2
  Filled 2012-07-25 (×2): qty 1

## 2012-07-25 MED ORDER — 0.9 % SODIUM CHLORIDE (POUR BTL) OPTIME
TOPICAL | Status: DC | PRN
  Administered 2012-07-25: 6000 mL

## 2012-07-25 MED ORDER — PANTOPRAZOLE SODIUM 40 MG PO TBEC
40.0000 mg | DELAYED_RELEASE_TABLET | Freq: Every day | ORAL | Status: DC
  Administered 2012-07-27 – 2012-07-31 (×5): 40 mg via ORAL
  Filled 2012-07-25 (×5): qty 1

## 2012-07-25 MED ORDER — METOPROLOL TARTRATE 12.5 MG HALF TABLET
12.5000 mg | ORAL_TABLET | Freq: Two times a day (BID) | ORAL | Status: DC
  Filled 2012-07-25 (×5): qty 1

## 2012-07-25 MED ORDER — SODIUM CHLORIDE 0.45 % IV SOLN
INTRAVENOUS | Status: DC
  Administered 2012-07-25: 20 mL/h via INTRAVENOUS

## 2012-07-25 MED ORDER — SODIUM CHLORIDE 0.9 % IJ SOLN
3.0000 mL | Freq: Two times a day (BID) | INTRAMUSCULAR | Status: DC
  Administered 2012-07-26 – 2012-07-27 (×3): 3 mL via INTRAVENOUS

## 2012-07-25 MED ORDER — MIDAZOLAM HCL 2 MG/2ML IJ SOLN
INTRAMUSCULAR | Status: AC
  Administered 2012-07-25: 2 mg
  Filled 2012-07-25: qty 2

## 2012-07-25 MED ORDER — SODIUM CHLORIDE 0.9 % IV SOLN
INTRAVENOUS | Status: DC
  Administered 2012-07-25: 20 mL via INTRAVENOUS

## 2012-07-25 MED ORDER — LIDOCAINE HCL (CARDIAC) 20 MG/ML IV SOLN
INTRAVENOUS | Status: DC | PRN
  Administered 2012-07-25: 8 mg via INTRAVENOUS

## 2012-07-25 MED ORDER — POTASSIUM CHLORIDE 10 MEQ/50ML IV SOLN
10.0000 meq | INTRAVENOUS | Status: AC
  Administered 2012-07-25 (×3): 10 meq via INTRAVENOUS

## 2012-07-25 MED ORDER — MIDAZOLAM HCL 2 MG/2ML IJ SOLN: 2.0000 mg | Freq: Once | INTRAMUSCULAR | Status: DC

## 2012-07-25 MED ORDER — BISACODYL 10 MG RE SUPP: 10.0000 mg | Freq: Every day | RECTAL | Status: DC

## 2012-07-25 MED ORDER — INSULIN ASPART 100 UNIT/ML ~~LOC~~ SOLN
0.0000 [IU] | SUBCUTANEOUS | Status: AC
  Administered 2012-07-25: 2 [IU] via SUBCUTANEOUS

## 2012-07-25 MED ORDER — GLYCOPYRROLATE 0.2 MG/ML IJ SOLN
INTRAMUSCULAR | Status: DC | PRN
  Administered 2012-07-25: 0.4 mg via INTRAVENOUS

## 2012-07-25 MED ORDER — HEPARIN SODIUM (PORCINE) 1000 UNIT/ML IJ SOLN
INTRAMUSCULAR | Status: DC | PRN
  Administered 2012-07-25: 2000 [IU] via INTRAVENOUS
  Administered 2012-07-25: 20000 [IU] via INTRAVENOUS

## 2012-07-25 MED ORDER — DEXMEDETOMIDINE HCL IN NACL 200 MCG/50ML IV SOLN
0.4000 ug/kg/h | INTRAVENOUS | Status: DC
  Filled 2012-07-25: qty 50

## 2012-07-25 MED ORDER — OXYCODONE HCL 5 MG PO TABS
5.0000 mg | ORAL_TABLET | ORAL | Status: DC | PRN
  Administered 2012-07-26 – 2012-07-27 (×4): 10 mg via ORAL
  Filled 2012-07-25 (×4): qty 2

## 2012-07-25 MED ORDER — MAGNESIUM SULFATE 40 MG/ML IJ SOLN
4.0000 g | Freq: Once | INTRAMUSCULAR | Status: AC
  Administered 2012-07-25: 4 g via INTRAVENOUS

## 2012-07-25 MED ORDER — INSULIN ASPART 100 UNIT/ML ~~LOC~~ SOLN
0.0000 [IU] | SUBCUTANEOUS | Status: DC
  Administered 2012-07-26: 2 [IU] via SUBCUTANEOUS

## 2012-07-25 MED ORDER — METOPROLOL TARTRATE 1 MG/ML IV SOLN: 2.5000 mg | INTRAVENOUS | Status: DC | PRN

## 2012-07-25 MED ORDER — PHENYLEPHRINE HCL 10 MG/ML IJ SOLN
0.0000 ug/min | INTRAVENOUS | Status: DC
  Filled 2012-07-25: qty 2

## 2012-07-25 MED ORDER — ASPIRIN EC 325 MG PO TBEC
325.0000 mg | DELAYED_RELEASE_TABLET | Freq: Every day | ORAL | Status: DC
  Administered 2012-07-26 – 2012-07-30 (×5): 325 mg via ORAL
  Filled 2012-07-25 (×6): qty 1

## 2012-07-25 MED ORDER — ROCURONIUM BROMIDE 100 MG/10ML IV SOLN
INTRAVENOUS | Status: DC | PRN
  Administered 2012-07-25: 50 mg via INTRAVENOUS

## 2012-07-25 MED ORDER — METOCLOPRAMIDE HCL 5 MG/ML IJ SOLN
10.0000 mg | Freq: Four times a day (QID) | INTRAMUSCULAR | Status: AC
  Administered 2012-07-25 – 2012-07-26 (×4): 10 mg via INTRAVENOUS
  Filled 2012-07-25 (×4): qty 2

## 2012-07-25 MED ORDER — FENTANYL CITRATE 0.05 MG/ML IJ SOLN
100.0000 ug | Freq: Once | INTRAMUSCULAR | Status: AC
  Administered 2012-07-25: 100 ug via INTRAVENOUS

## 2012-07-25 MED ORDER — BISACODYL 5 MG PO TBEC
10.0000 mg | DELAYED_RELEASE_TABLET | Freq: Every day | ORAL | Status: DC
  Administered 2012-07-26 – 2012-07-30 (×4): 10 mg via ORAL
  Filled 2012-07-25 (×2): qty 2
  Filled 2012-07-25: qty 1

## 2012-07-25 MED ORDER — VECURONIUM BROMIDE 10 MG IV SOLR
INTRAVENOUS | Status: DC | PRN
  Administered 2012-07-25 (×2): 10 mg via INTRAVENOUS

## 2012-07-25 MED ORDER — INSULIN REGULAR BOLUS VIA INFUSION
0.0000 [IU] | Freq: Three times a day (TID) | INTRAVENOUS | Status: DC
  Filled 2012-07-25: qty 10

## 2012-07-25 MED ORDER — ALBUMIN HUMAN 5 % IV SOLN
INTRAVENOUS | Status: DC | PRN
  Administered 2012-07-25 (×2): via INTRAVENOUS

## 2012-07-25 MED ORDER — ONDANSETRON HCL 4 MG/2ML IJ SOLN
4.0000 mg | Freq: Four times a day (QID) | INTRAMUSCULAR | Status: DC | PRN
  Administered 2012-07-26 – 2012-07-27 (×2): 4 mg via INTRAVENOUS
  Filled 2012-07-25 (×2): qty 2

## 2012-07-25 MED ORDER — DOCUSATE SODIUM 100 MG PO CAPS
200.0000 mg | ORAL_CAPSULE | Freq: Every day | ORAL | Status: DC
  Administered 2012-07-26 – 2012-07-30 (×5): 200 mg via ORAL
  Filled 2012-07-25 (×7): qty 2

## 2012-07-25 MED ORDER — LACTATED RINGERS IV SOLN
INTRAVENOUS | Status: DC
  Administered 2012-07-25: 20 mL/h via INTRAVENOUS

## 2012-07-25 MED ORDER — DEXTROSE 5 % IV SOLN
1.5000 g | Freq: Two times a day (BID) | INTRAVENOUS | Status: AC
  Administered 2012-07-25 – 2012-07-27 (×4): 1.5 g via INTRAVENOUS
  Filled 2012-07-25 (×4): qty 1.5

## 2012-07-25 MED ORDER — PROTAMINE SULFATE 10 MG/ML IV SOLN
INTRAVENOUS | Status: DC | PRN
  Administered 2012-07-25: 200 mg via INTRAVENOUS

## 2012-07-25 MED ORDER — SODIUM CHLORIDE 0.9 % IV SOLN
INTRAVENOUS | Status: DC
  Filled 2012-07-25: qty 1

## 2012-07-25 MED ORDER — ALBUMIN HUMAN 5 % IV SOLN
250.0000 mL | INTRAVENOUS | Status: AC | PRN
  Administered 2012-07-26 (×2): 250 mL via INTRAVENOUS

## 2012-07-25 MED ORDER — SODIUM CHLORIDE 0.9 % IV SOLN: 250.0000 mL | INTRAVENOUS | Status: DC

## 2012-07-25 MED ORDER — NITROGLYCERIN IN D5W 200-5 MCG/ML-% IV SOLN
0.0000 ug/min | INTRAVENOUS | Status: DC
  Administered 2012-07-25: 166.667 ug/min via INTRAVENOUS
  Filled 2012-07-25: qty 250

## 2012-07-25 MED ORDER — HEMOSTATIC AGENTS (NO CHARGE) OPTIME
TOPICAL | Status: DC | PRN
  Administered 2012-07-25: 1 via TOPICAL

## 2012-07-25 MED ORDER — DEXMEDETOMIDINE HCL IN NACL 200 MCG/50ML IV SOLN
0.1000 ug/kg/h | INTRAVENOUS | Status: DC
  Administered 2012-07-25: 0.7 ug/kg/h via INTRAVENOUS

## 2012-07-25 SURGICAL SUPPLY — 100 items
ATTRACTOMAT 16X20 MAGNETIC DRP (DRAPES) ×2 IMPLANT
BAG DECANTER FOR FLEXI CONT (MISCELLANEOUS) ×2 IMPLANT
BANDAGE ELASTIC 4 VELCRO ST LF (GAUZE/BANDAGES/DRESSINGS) ×2 IMPLANT
BANDAGE ELASTIC 6 VELCRO ST LF (GAUZE/BANDAGES/DRESSINGS) ×2 IMPLANT
BANDAGE GAUZE ELAST BULKY 4 IN (GAUZE/BANDAGES/DRESSINGS) ×2 IMPLANT
BASKET HEART (ORDER IN 25'S) (MISCELLANEOUS) ×1
BASKET HEART (ORDER IN 25S) (MISCELLANEOUS) ×1 IMPLANT
BLADE STERNUM SYSTEM 6 (BLADE) ×2 IMPLANT
BLADE SURG 11 STRL SS (BLADE) ×2 IMPLANT
BLADE SURG ROTATE 9660 (MISCELLANEOUS) ×2 IMPLANT
CANISTER SUCTION 2500CC (MISCELLANEOUS) ×2 IMPLANT
CANNULA EZ GLIDE AORTIC 21FR (CANNULA) ×2 IMPLANT
CANNULA SOFTFLOW AORTIC 8M24FR (CANNULA) IMPLANT
CANNULA VENOUS LOW PROF 34X46 (CANNULA) ×2 IMPLANT
CANNULA VESSEL W/WING W/VALVE (CANNULA) ×2 IMPLANT
CATH CPB KIT HENDRICKSON (MISCELLANEOUS) ×2 IMPLANT
CATH ROBINSON RED A/P 18FR (CATHETERS) ×2 IMPLANT
CATH THORACIC 36FR (CATHETERS) ×2 IMPLANT
CATH THORACIC 36FR RT ANG (CATHETERS) ×2 IMPLANT
CLIP FOGARTY SPRING 6M (CLIP) ×2 IMPLANT
CLIP TI MEDIUM 24 (CLIP) IMPLANT
CLIP TI WIDE RED SMALL 24 (CLIP) ×2 IMPLANT
CLOTH BEACON ORANGE TIMEOUT ST (SAFETY) ×2 IMPLANT
COVER SURGICAL LIGHT HANDLE (MISCELLANEOUS) ×2 IMPLANT
CRADLE DONUT ADULT HEAD (MISCELLANEOUS) ×2 IMPLANT
DERMABOND ADVANCED (GAUZE/BANDAGES/DRESSINGS) ×1
DERMABOND ADVANCED .7 DNX12 (GAUZE/BANDAGES/DRESSINGS) ×1 IMPLANT
DRAPE CARDIOVASCULAR INCISE (DRAPES) ×1
DRAPE SLUSH MACHINE 52X66 (DRAPES) ×2 IMPLANT
DRAPE SLUSH/WARMER DISC (DRAPES) IMPLANT
DRAPE SRG 135X102X78XABS (DRAPES) ×1 IMPLANT
DRSG COVADERM 4X14 (GAUZE/BANDAGES/DRESSINGS) ×2 IMPLANT
ELECT REM PT RETURN 9FT ADLT (ELECTROSURGICAL) ×4
ELECTRODE REM PT RTRN 9FT ADLT (ELECTROSURGICAL) ×2 IMPLANT
GLOVE BIO SURGEON STRL SZ 6 (GLOVE) ×12 IMPLANT
GLOVE BIO SURGEON STRL SZ 6.5 (GLOVE) ×2 IMPLANT
GLOVE BIOGEL PI IND STRL 6.5 (GLOVE) ×3 IMPLANT
GLOVE BIOGEL PI IND STRL 7.0 (GLOVE) ×1 IMPLANT
GLOVE BIOGEL PI IND STRL 7.5 (GLOVE) ×1 IMPLANT
GLOVE BIOGEL PI INDICATOR 6.5 (GLOVE) ×3
GLOVE BIOGEL PI INDICATOR 7.0 (GLOVE) ×1
GLOVE BIOGEL PI INDICATOR 7.5 (GLOVE) ×1
GLOVE EUDERMIC 7 POWDERFREE (GLOVE) ×6 IMPLANT
GLOVE SURG SS PI 6.0 STRL IVOR (GLOVE) ×2 IMPLANT
GOWN PREVENTION PLUS XLARGE (GOWN DISPOSABLE) ×4 IMPLANT
GOWN STRL NON-REIN LRG LVL3 (GOWN DISPOSABLE) ×12 IMPLANT
HEMOSTAT POWDER SURGIFOAM 1G (HEMOSTASIS) ×6 IMPLANT
HEMOSTAT SURGICEL 2X14 (HEMOSTASIS) ×2 IMPLANT
INSERT FOGARTY XLG (MISCELLANEOUS) IMPLANT
KIT BASIN OR (CUSTOM PROCEDURE TRAY) ×2 IMPLANT
KIT ROOM TURNOVER OR (KITS) ×2 IMPLANT
KIT SUCTION CATH 14FR (SUCTIONS) ×4 IMPLANT
KIT VASOVIEW W/TROCAR VH 2000 (KITS) ×2 IMPLANT
MARKER GRAFT CORONARY BYPASS (MISCELLANEOUS) ×6 IMPLANT
NS IRRIG 1000ML POUR BTL (IV SOLUTION) ×10 IMPLANT
PACK OPEN HEART (CUSTOM PROCEDURE TRAY) ×2 IMPLANT
PAD ARMBOARD 7.5X6 YLW CONV (MISCELLANEOUS) ×4 IMPLANT
PAD ELECT DEFIB RADIOL ZOLL (MISCELLANEOUS) ×2 IMPLANT
PENCIL BUTTON HOLSTER BLD 10FT (ELECTRODE) ×2 IMPLANT
PUNCH AORTIC ROTATE 4.0MM (MISCELLANEOUS) ×2 IMPLANT
PUNCH AORTIC ROTATE 4.5MM 8IN (MISCELLANEOUS) IMPLANT
PUNCH AORTIC ROTATE 5MM 8IN (MISCELLANEOUS) IMPLANT
SPONGE GAUZE 4X4 12PLY (GAUZE/BANDAGES/DRESSINGS) ×6 IMPLANT
SUT BONE WAX W31G (SUTURE) ×2 IMPLANT
SUT MNCRL AB 4-0 PS2 18 (SUTURE) ×2 IMPLANT
SUT PROLENE 3 0 SH DA (SUTURE) ×2 IMPLANT
SUT PROLENE 4 0 RB 1 (SUTURE)
SUT PROLENE 4 0 SH DA (SUTURE) IMPLANT
SUT PROLENE 4-0 RB1 .5 CRCL 36 (SUTURE) IMPLANT
SUT PROLENE 6 0 C 1 30 (SUTURE) ×4 IMPLANT
SUT PROLENE 7 0 BV 1 (SUTURE) ×4 IMPLANT
SUT PROLENE 7 0 BV1 MDA (SUTURE) ×4 IMPLANT
SUT PROLENE 8 0 BV175 6 (SUTURE) IMPLANT
SUT SILK  1 MH (SUTURE)
SUT SILK 1 MH (SUTURE) IMPLANT
SUT STEEL 6MS V (SUTURE) ×4 IMPLANT
SUT STEEL STERNAL CCS#1 18IN (SUTURE) IMPLANT
SUT STEEL SZ 6 DBL 3X14 BALL (SUTURE) IMPLANT
SUT VIC AB 1 CTX 27 (SUTURE) ×2 IMPLANT
SUT VIC AB 1 CTX 36 (SUTURE) ×2
SUT VIC AB 1 CTX36XBRD ANBCTR (SUTURE) ×2 IMPLANT
SUT VIC AB 2-0 CT1 27 (SUTURE) ×1
SUT VIC AB 2-0 CT1 TAPERPNT 27 (SUTURE) ×1 IMPLANT
SUT VIC AB 2-0 CTX 27 (SUTURE) IMPLANT
SUT VIC AB 3-0 SH 27 (SUTURE)
SUT VIC AB 3-0 SH 27X BRD (SUTURE) IMPLANT
SUT VIC AB 3-0 X1 27 (SUTURE) IMPLANT
SUT VICRYL 4-0 PS2 18IN ABS (SUTURE) IMPLANT
SUTURE E-PAK OPEN HEART (SUTURE) ×2 IMPLANT
SYSTEM SAHARA CHEST DRAIN ATS (WOUND CARE) ×2 IMPLANT
TAPE CLOTH SURG 4X10 WHT LF (GAUZE/BANDAGES/DRESSINGS) ×4 IMPLANT
TAPE PAPER 3X10 WHT MICROPORE (GAUZE/BANDAGES/DRESSINGS) ×2 IMPLANT
TOWEL OR 17X24 6PK STRL BLUE (TOWEL DISPOSABLE) ×4 IMPLANT
TOWEL OR 17X26 10 PK STRL BLUE (TOWEL DISPOSABLE) ×4 IMPLANT
TRAY FOLEY IC TEMP SENS 14FR (CATHETERS) ×2 IMPLANT
TUBE CONNECTING 12X1/4 (SUCTIONS) ×2 IMPLANT
TUBE FEEDING 8FR 16IN STR KANG (MISCELLANEOUS) ×2 IMPLANT
TUBING INSUFFLATION 10FT LAP (TUBING) ×2 IMPLANT
UNDERPAD 30X30 INCONTINENT (UNDERPADS AND DIAPERS) ×2 IMPLANT
WATER STERILE IRR 1000ML POUR (IV SOLUTION) ×4 IMPLANT

## 2012-07-25 NOTE — Progress Notes (Addendum)
Pre-op Cardiac Surgery  Carotid Findings:  There is no obvious evidence of hemodynamically significant internal carotid artery stenosis. Vertebral arteries are patent with antegrade flow.   07/25/2012 10:07 AM [name] [name], RVS  Limb dopplers have not been completed yet. Upper Extremity Right Left  Brachial Pressures 154 Triphasic 148 Triohasic  Radial Waveforms Triphsic Triphasic  Ulnar Waveforms Biphasic Triphasic  Palmar Arch (Allen's Test) Normal Normal   Findings:  Dopple waveforms remained normal bilaterally with both radial and ulnar compressions.    Lower  Extremity Right Left  Dorsalis Pedis 57 Severely dampened monophasic 133 Triphasic  Posterior Tibial 62 Severely dampened monophasic 131 Biphasic  Ankle/Brachial Indices 0.40 0.86    Findings:  ABIs and Doppler waveforms indicate a severe reduction in arterial flow on the right.                   ABIs indicate a mild reduction in arterial flow with normal Doppler waveforms on the left.

## 2012-07-25 NOTE — Interval H&P Note (Signed)
History and Physical Interval Note:  07/25/2012 1:00 PM  [name] [name]  has presented today for surgery, with the diagnosis of CAD  The various methods of treatment have been discussed with the patient and family. After consideration of risks, benefits and other options for treatment, the patient has consented to  Procedure(s): CORONARY ARTERY BYPASS GRAFTING (CABG) (N/A) as a surgical intervention .  The patient's history has been reviewed, patient examined, no change in status, stable for surgery.  I have reviewed the patient's chart and labs.  Questions were answered to the patient's satisfaction.    ABI 0.4 on right - will try to limit vein harvest to left leg  [name]

## 2012-07-25 NOTE — Op Note (Signed)
[name] [name], [name] [name]               ACCOUNT NO.:  [id number]  MEDICAL RECORD NO.:  [id number]  LOCATION:  2311                         FACILITY:  [facility]  PHYSICIAN:  Salvatore Decent. Dorris Fetch, M.D.DATE OF BIRTH:  1950/04/30  DATE OF PROCEDURE:  07/25/2012 DATE OF DISCHARGE:                              OPERATIVE REPORT   PREOPERATIVE DIAGNOSES:  Severe 3-vessel coronary disease, status post myocardial infarction.  POSTOPERATIVE DIAGNOSIS:  Severe 3-vessel coronary disease, status post myocardial infarction.  PROCEDURE:  Median sternotomy, extracorporeal circulation, coronary artery bypass grafting x5 (left internal mammary artery to LAD, sequential saphenous vein graft to obtuse marginal 1, obtuse marginal 2, and posterior descending with Y graft to 1st diagonal), endoscopic vein harvest, left thigh.  SURGEON:  Salvatore Decent. Dorris Fetch, MD  ASSISTANT:  Lowella Dandy.  ANESTHESIA:  General.  FINDINGS:  Vein from left thigh satisfactory, vein from below the knee too small to use for bypass, mammary artery relatively small but with good flow.  Posterior descending poor quality target, remaining targets good quality.  CLINICAL NOTE:  [name] [name] is a 62 year old woman who presented with ST elevation MI.  She was taken emergently to the catheterization laboratory where she was found to have severe 3-vessel disease.  Her pain resolved and she was treated with medications initially and advised to have coronary artery bypass grafting.  The indications, risks, benefits, and alternatives were discussed in detail with the patient.  She understood and accepted the risks and agreed to proceed. Her preoperative testing showed no significant carotid stenoses, however, she did have severe peripheral vascular disease, particularly on the right with an ABI of 0.4.  Given that she would likely need a bypass procedure on the right leg, the plan was to only use saphenous vein from the left leg unless  there was no other alternative.  OPERATIVE NOTE:  [name] [name] was brought to the preoperative holding area on July 25, 2012.  There Anesthesia placed a Swan-Ganz catheter and an arterial blood pressure monitoring line.  Intravenous antibiotics were administered.  She was taken to the operating room, anesthetized, and intubated.  A Foley catheter was placed.  The chest, abdomen, and legs were prepped and draped in the usual sterile fashion.  Incision was made in the medial aspect of the left leg at the level of the knee.  The greater saphenous vein was identified.  It was harvested endoscopically. Simultaneously a median sternotomy was performed and the left internal mammary artery was harvested using standard technique.  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  Heparin, 2000 units, was administered during the vessel harvest.  Remainder of the full heparin dose was given prior to opening the pericardium.  After removing the saphenous vein, it was obvious that the more distal portion of the vein from the lower leg was far too small to use as a bypass graft. However, the vein from the thigh was satisfactory.  Given the anatomy, it was felt the best option would be to sequence obtuse marginal 1 and 2 and the posterior descending, and then graft the diagonal if there was sufficient vein left over.  The remainder of the full heparin dose was given.  The pericardium was  opened.  The ascending aorta was of normal size with no palpable atherosclerotic disease.  The aorta was cannulated via concentric 2-0 Ethibond pledgeted pursestring sutures after confirming adequate anticoagulation with ACT measurement.  The dual-stage venous cannula was placed via a pursestring suture in the right atrial appendage.  Cardiopulmonary bypass was instituted and the patient was cooled to 32 degrees Celsius.  The coronary arteries were inspected and anastomotic sites were chosen.  The conduits were inspected and cut to  length.  A foam pad was placed in the pericardium to insulate the heart and protect the left phrenic nerve.  A temperature probe was placed in myocardial septum and a cardioplegic cannula was placed in the ascending aorta.  The aorta was crossclamped.  The left ventricle was emptied via aortic root vent.  Cardiac arrest then was achieved combination of cold antegrade blood cardioplegia and topical iced saline.  After achieving a complete diastolic arrest and adequate myocardial septal cooling, the following distal anastomoses were performed.  First, a reversed saphenous vein graft was placed sequentially to obtuse marginals 1 and 2 and the posterior descending.  Obtuse marginal 1 was a 1.5 mm good quality target, as was obtuse marginal 2.  The posterior descending only accepted a 1 mm probe and was a fair quality target. Side-to-side anastomoses were performed to the obtuse marginals and an end- to-side to the posterior descending. All were done with running 7-0 Prolene sutures.  All anastomoses were probed proximally and distally at their completion to ensure patency.  Cardioplegia was administered at the completion of vein graft to assess flow and hemostasis.  Next a short segment of reversed saphenous vein was beveled distally and was then anastomosed end-to-side to the first diagonal branch of the LAD.  This was a 1.5 mm vessel.  Again the anastomosis was performed with a running 7-0 Prolene suture.  Additional cardioplegia was administered.  Next, the left internal mammary artery was brought through a window in the pericardium.  The distal end was beveled and was anastomosed end-to-side to the distal LAD.  This was a 1.5 mm target vessel.  The mammary did accept a 1.5 mm probe.  An end-to-side anastomosis was performed with a running 8-0 Prolene suture.  At the completion of the mammary to LAD anastomosis, the bulldog clamp was briefly removed.  Immediate and rapid septal  rewarming was noted.  The bulldog clamp was replaced and the mammary pedicle was tacked to the epicardial surface of the heart with 6-0 Prolene sutures.  The cardioplegic cannula was removed from the ascending aorta.  The vein graft to the obtuse marginals and posterior descending was cut to length.  The diagonal graft did not have sufficient length to reach the aorta.  The proximal anastomosis for the sequential vein was performed to a 4.0 mm punch aortotomy with a running 6-0 Prolene suture.  At the completion of the proximal anastomosis, the patient was placed in Trendelenburg position.  Lidocaine was administered.  The bulldog clamp was removed from the left mammary artery.  The aortic root was de-aired and the aortic crossclamp was removed.  Total crossclamp time was 77 minutes.  The patient initially fibrillated, and a single defibrillation with 10 joules was required.  She was in sinus rhythm thereafter. Bulldog clamps were placed proximally and distally on the saphenous vein and a venotomy was made.  The proximal end of the diagonal vein graft was beveled and anastomosed end-to-side to the proximal portion of the sequential vein with  a running 7-0 Prolene suture.  While rewarming was completed, all proximal and distal anastomoses were inspected for hemostasis.  Epicardial pacing wires were placed on the right ventricle and right atrium and atrial pacing was initiated.  When the patient had rewarmed to a core temperature of 37 degrees Celsius, she was weaned from cardiopulmonary bypass on the first attempt.  Total bypass time was 135 minutes. The initial cardiac index greater than 2 liters/minute/meter squared and the patient remained hemodynamically stable throughout the postbypass period.  A test dose of protamine was administered and was well tolerated.  The atrial and aortic cannulae were removed.  The remainder of the protamine was administered without incident.  The chest  was irrigated with warm saline.  Hemostasis was achieved.  The pericardium was reapproximated with interrupted 3-0 silk sutures.  It came together easily without tension or kinking the underlying grafts.  The left pleural and single mediastinal chest tubes were placed in separate subcostal incisions. The sternum was closed with interrupted heavy gauge stainless steel wires.  The pectoralis fascia, subcutaneous tissue, and skin were closed in standard fashion.  All sponge, needle, and instrument counts were correct at the end of the procedure.  The patient was taken from the operating room to the postanesthetic care unit in good condition.     Salvatore Decent Dorris Fetch, M.D.     SCH/MEDQ  D:  07/25/2012  T:  07/25/2012  Job:  [id number]

## 2012-07-25 NOTE — Brief Op Note (Addendum)
07/21/2012 - 07/25/2012  5:24 PM  PATIENT:  [name] [name]  62 y.o. female  PRE-OPERATIVE DIAGNOSIS:  Coronary Artery Disease  POST-OPERATIVE DIAGNOSIS:  Coronary Artery Disease  PROCEDURE:  Procedure(s): CORONARY ARTERY BYPASS GRAFTING  X 5 -LIMA to LAD -SVG to DIAGONAL -SEQUENTIAL SVG to OM1, OM2, and PDA  ENDOSCOPIC SAPHENOUS VEIN HARVEST LEFT LEG  SURGEON:  Surgeon(s) and Role:    * Loreli Slot, MD - Primary  PHYSICIAN ASSISTANT: Lowella Dandy PA-C  ANESTHESIA:   general  EBL:  Total I/O In: 1512 [P.O.:60; I.V.:1452] Out: 1525 [Urine:1525]  BLOOD ADMINISTERED: CELLSAVER  DRAINS: Left Pleural Chest tube, Mediastinal Chest Drain   LOCAL MEDICATIONS USED:  NONE  SPECIMEN:  No Specimen  DISPOSITION OF SPECIMEN:  N/A  COUNTS:  YES  PLAN OF CARE: Admit to inpatient   PATIENT DISPOSITION:  ICU - intubated and hemodynamically stable.   Delay start of Pharmacological VTE agent (>24hrs) due to surgical blood loss or risk of bleeding: yes  XC= 77 min CPB= 135 min  Limited amount of usable vein. Therefore sequenced OM1, OM2 and PDA and took SVG to Diagonal off that vein graft as a Y graft. LIMA relatively small but good flow.

## 2012-07-25 NOTE — Progress Notes (Signed)
[facility]  Subjective: No complaints.  Objective: Vital signs in last 24 hours: Temp:  [97.6 F (36.4 C)-98.1 F (36.7 C)] 98.1 F (36.7 C) (06/24 0500) Pulse Rate:  [71] 71 (06/23 1645) Resp:  [18] 18 (06/23 2018) BP: (129-168)/(76-95) 136/76 mmHg (06/24 0500) SpO2:  [99 %-100 %] 100 % (06/24 0500) Weight:  [113 lb 12.1 oz (51.6 kg)] 113 lb 12.1 oz (51.6 kg) (06/24 0500) Last BM Date: 07/21/12  Intake/Output from previous day: 06/23 0701 - 06/24 0700 In: 894 [P.O.:480; I.V.:414] Out: 2025 [Urine:2025] Intake/Output this shift:    Medications Current Facility-Administered Medications  Medication Dose Route Frequency Provider Last Rate Last Dose  . 0.9 %  sodium chloride infusion   Intravenous Continuous [name], MD 10 mL/hr at 07/22/12 1822    . acetaminophen (TYLENOL) tablet 650 mg  650 mg Oral Q4H PRN [name], MD   650 mg at 07/24/12 0910  . ALPRAZolam Prudy Feeler) tablet 0.25-0.5 mg  0.25-0.5 mg Oral Q4H PRN Loreli Slot, MD   0.25 mg at 07/23/12 2130  . aminocaproic acid (AMICAR) 10 g in sodium chloride 0.9 % 100 mL infusion   Intravenous To OR [name], MD      . amLODipine (NORVASC) tablet 5 mg  5 mg Oral Daily Mihai Croitoru, MD   5 mg at 07/24/12 1034  . aspirin chewable tablet 81 mg  81 mg Oral Daily [name], MD   81 mg at 07/24/12 0910  . atorvastatin (LIPITOR) tablet 80 mg  80 mg Oral q1800 Leeann Must, MD   80 mg at 07/24/12 1800  . bisacodyl (DULCOLAX) EC tablet 5 mg  5 mg Oral Once Loreli Slot, MD      . cefUROXime (ZINACEF) 1.5 g in dextrose 5 % 50 mL IVPB  1.5 g Intravenous To OR [name], MD      . cefUROXime (ZINACEF) 750 mg in dextrose 5 % 50 mL IVPB  750 mg Intravenous To OR [name], MD      . chlorhexidine (HIBICLENS) 4 % liquid 4 application  60 mL Topical Once Loreli Slot, MD      . chlorhexidine (HIBICLENS) 4 % liquid 4 application  60 mL Topical  Once Loreli Slot, MD      . cloNIDine (CATAPRES) tablet 0.1 mg  0.1 mg Oral BID Leeann Must, MD   0.1 mg at 07/24/12 2217  . dexmedetomidine (PRECEDEX) 400 MCG/100ML infusion  0.1-0.7 mcg/kg/hr Intravenous To OR [name], MD      . diazepam (VALIUM) tablet 2 mg  2 mg Oral Once Loreli Slot, MD      . diazepam (VALIUM) tablet 2 mg  2 mg Oral Once Loreli Slot, MD      . DOPamine (INTROPIN) 800 mg in dextrose 5 % 250 mL infusion  2-20 mcg/kg/min Intravenous To OR [name], MD      . EPINEPHrine (ADRENALIN) 4,000 mcg in dextrose 5 % 250 mL infusion  0.5-20 mcg/min Intravenous To OR [name], MD      . heparin 2,500 Units, papaverine 30 mg in electrolyte-148 (PLASMALYTE-148) 500 mL irrigation   Irrigation To OR [name], MD      . heparin 30,000 units/NS 1000 mL solution for CELLSAVER   Other To OR [name], MD      . heparin ADULT infusion 100 units/mL (25000 units/250  mL)  800 Units/hr Intravenous Continuous Colleen Can, [place] 8 mL/hr at 07/23/12 2132 800 Units/hr at 07/23/12 2132  . insulin regular (NOVOLIN R,HUMULIN R) 1 Units/mL in sodium chloride 0.9 % 100 mL infusion   Intravenous To OR [name], MD      . magnesium sulfate (IV Push/IM) injection 40 mEq  40 mEq Other To OR [name], MD      . metoprolol (LOPRESSOR) tablet 50 mg  50 mg Oral BID [name], MD   50 mg at 07/24/12 2217  . metoprolol tartrate (LOPRESSOR) tablet 12.5 mg  12.5 mg Oral Once Loreli Slot, MD      . nitroGLYCERIN (NITROSTAT) SL tablet 0.4 mg  0.4 mg Sublingual Q5 Min x 3 PRN Leeann Must, MD      . nitroGLYCERIN 0.2 mg/mL in dextrose 5 % infusion  3-30 mcg/min Intravenous Titrated Leeann Must, MD   5 mcg/min at 07/22/12 1200  . nitroGLYCERIN 0.2 mg/mL in dextrose 5 % infusion  2-200 mcg/min Intravenous To OR [name], MD      . ondansetron [facility]) injection 4 mg  4 mg Intravenous Q6H PRN [name], MD       . phenylephrine (NEO-SYNEPHRINE) 20,000 mcg in dextrose 5 % 250 mL infusion  30-200 mcg/min Intravenous To OR [name], MD      . potassium chloride injection 80 mEq  80 mEq Other To OR [name], MD      . vancomycin (VANCOCIN) 1,000 mg in sodium chloride 0.9 % 250 mL IVPB  1,000 mg Intravenous To OR [name], MD        PE: General appearance: alert, cooperative and no distress Lungs: clear to auscultation bilaterally Heart: regular rate and rhythm, S1, S2 normal, no murmur, click, rub or gallop Extremities: no LEE Pulses: 1+ and symmetric Skin: warm and dry Neurologic: Grossly normal  Lab Results:   Recent Labs  07/23/12 0922 07/24/12 0437 07/25/12 0550  WBC 6.1 6.3 6.2  HGB 13.6 12.8 12.8  HCT 40.3 37.2 37.3  PLT 175 175 188   BMET  Recent Labs  07/24/12 0437 07/25/12 0550  NA 140 141  K 4.4 4.1  CL 106 106  CO2 25 23  GLUCOSE 82 89  BUN 12 16  CREATININE 0.72 0.68  CALCIUM 8.9 9.2    Assessment/Plan  Active Problems:   STEMI (ST elevation myocardial infarction), secondary to disease of RCA, with disease of LAD and diag. branch and chronic non dominant LCX occlusion for CABG    Essential hypertension   Tobacco abuse   Hyperlipidemia LDL goal < 70  Plan: Day 4 S/P LHC, revealing severe 3 vessel disease. Plan for CABG later today with Dr. Dorris Fetch. HR and BP both stable. Will continue to follow pos-op.     LOS: 4 days    Amahri Dengel M. Sharol Harness, PA-C 07/25/2012 8:05 AM

## 2012-07-25 NOTE — Progress Notes (Signed)
Patient ID: [name] [name], female   DOB: [birth year], 62 y.o.   MRN: [id number]  SICU Evening Rounds:  Hemodynamically stable on NTG only  Urine output good  CT output low.  CBC    Component Value Date/Time   WBC 6.3 07/25/2012 1900   RBC 3.04* 07/25/2012 1900   HGB 9.9* 07/25/2012 1920   HCT 29.0* 07/25/2012 1920   PLT 80* 07/25/2012 1900   MCV 92.4 07/25/2012 1900   MCH 32.2 07/25/2012 1900   MCHC 34.9 07/25/2012 1900   RDW 14.0 07/25/2012 1900   LYMPHSABS 1.4 07/21/2012 2337   MONOABS 0.5 07/21/2012 2337   EOSABS 0.1 07/21/2012 2337   BASOSABS 0.0 07/21/2012 2337    BMET    Component Value Date/Time   NA 140 07/25/2012 1920   K 3.4* 07/25/2012 1920   CL 106 07/25/2012 0550   CO2 23 07/25/2012 0550   GLUCOSE 88 07/25/2012 1920   BUN 16 07/25/2012 0550   CREATININE 0.68 07/25/2012 0550   CALCIUM 9.2 07/25/2012 0550   GFRNONAA >90 07/25/2012 0550   GFRAA >90 07/25/2012 0550    Continue routine postop course.

## 2012-07-25 NOTE — OR Nursing (Signed)
18:15 - 1st call to SICU, call to front desk to inform family off pump.

## 2012-07-25 NOTE — OR Nursing (Signed)
18:40 - 2nd call to SICU.

## 2012-07-25 NOTE — Transfer of Care (Signed)
Immediate Anesthesia Transfer of Care Note  Patient: [name] [name]  Procedure(s) Performed: Procedure(s): CORONARY ARTERY BYPASS GRAFTING (CABG) times five with Left  Endoscopic Saphenous Vein Harvest and Left Internal  Mammary  (N/A)  Patient Location: SICU  Anesthesia Type:General  Level of Consciousness: sedated and unresponsive  Airway & Oxygen Therapy: Patient remains intubated per anesthesia plan and Patient placed on Ventilator (see vital sign flow sheet for setting)  Post-op Assessment: Report given to PACU RN and Post -op Vital signs reviewed and stable  Post vital signs: Reviewed and stable  Complications: No apparent anesthesia complications

## 2012-07-25 NOTE — Preoperative (Signed)
Beta Blockers   Reason not to administer Beta Blockers:Not ApplicablePt received 1013 dose 50 mg PO lopressor.

## 2012-07-25 NOTE — Progress Notes (Signed)
Pt. Seen and examined. Agree with the NP/PA-C note as written.  Stable today, chest pain free. She says she is "done with smoking". Plan for CABG later today. We will follow along with [facility].  Chrystie Nose, MD, [facility] Attending Cardiologist [facility]

## 2012-07-25 NOTE — H&P (View-Only) (Signed)
Reason for Consult:3 vessel CAD, post STEMI Referring Physician: Dr. Rulon Eisenmenger is an 62 y.o. female.  HPI: 62 yo woman presents with a cc/o CP  She is a 63 yo woman with a history of hypertension, tobacco abuse and a + family history of CAD(brother died of Mi on his 60s). She began having Cp with exertion 2 days prior to admission. On the day of admission she was having frequent episodes and then the pain became constant. Also mild SOB and nausea. She called EMS and was brought to the ED. She was a code STEMI with ongoing CP on arrival. Medical therapy initiated and taken emergently to cath lab. Cath revealed severe 3 vessel CAD. She had inferior hypokinesis as well as mild MR on LV gram. Her pain resolved and she was started on integrelin.  She denies any CP prior to the recent events. However, she did state that she thinks she had a stroke in February of this year. She did not seek medical attention at the time. She says she had some right UE weakness and loss of coordination and that her speech changed. She says these issues have improved since that time. She does not have any problems ambulating.  Past Medical History  Diagnosis Date  . Hypertension   Tobacco abuse Possible stroke in 2/14  History reviewed. No pertinent past surgical history.  History reviewed. No pertinent family history.  Social History:  reports that she has been smoking Cigarettes.  She has been smoking about 0.25 packs per day. She does not have any smokeless tobacco history on file. She reports that she does not drink alcohol or use illicit drugs.  Allergies: No Known Allergies  Medications:  Prior to Admission:  Prescriptions prior to admission  Medication Sig Dispense Refill  . cloNIDine (CATAPRES) 0.1 MG tablet Take 0.1 mg by mouth 2 (two) times daily.      . metoprolol (LOPRESSOR) 50 MG tablet Take 50 mg by mouth 2 (two) times daily.        Results for orders placed during the hospital  encounter of 07/21/12 (from the past 48 hour(s))  CBC     Status: None   Collection Time    07/21/12  8:57 PM      Result Value Range   WBC 8.2  4.0 - 10.5 K/uL   RBC 4.26  3.87 - 5.11 MIL/uL   Hemoglobin 14.3  12.0 - 15.0 g/dL   HCT 95.6  21.3 - 08.6 %   MCV 93.9  78.0 - 100.0 fL   MCH 33.6  26.0 - 34.0 pg   MCHC 35.8  30.0 - 36.0 g/dL   RDW 57.8  46.9 - 62.9 %   Platelets 195  150 - 400 K/uL  DIFFERENTIAL     Status: None   Collection Time    07/21/12  8:57 PM      Result Value Range   Neutrophils Relative % 57  43 - 77 %   Neutro Abs 4.7  1.7 - 7.7 K/uL   Lymphocytes Relative 34  12 - 46 %   Lymphs Abs 2.8  0.7 - 4.0 K/uL   Monocytes Relative 7  3 - 12 %   Monocytes Absolute 0.6  0.1 - 1.0 K/uL   Eosinophils Relative 2  0 - 5 %   Eosinophils Absolute 0.1  0.0 - 0.7 K/uL   Basophils Relative 1  0 - 1 %   Basophils Absolute 0.0  0.0 -  0.1 K/uL  PROTIME-INR     Status: None   Collection Time    07/21/12  8:57 PM      Result Value Range   Prothrombin Time 12.7  11.6 - 15.2 seconds   INR 0.96  0.00 - 1.49  APTT     Status: None   Collection Time    07/21/12  8:57 PM      Result Value Range   aPTT 26  24 - 37 seconds  BASIC METABOLIC PANEL     Status: Abnormal   Collection Time    07/21/12  8:57 PM      Result Value Range   Sodium 139  135 - 145 mEq/L   Potassium 3.1 (*) 3.5 - 5.1 mEq/L   Chloride 101  96 - 112 mEq/L   CO2 27  19 - 32 mEq/L   Glucose, Bld 107 (*) 70 - 99 mg/dL   BUN 15  6 - 23 mg/dL   Creatinine, Ser 3.08  0.50 - 1.10 mg/dL   Calcium 9.8  8.4 - 65.7 mg/dL   GFR calc non Af Amer 70 (*) >90 mL/min   GFR calc Af Amer 81 (*) >90 mL/min   Comment:            The eGFR has been calculated     using the CKD EPI equation.     This calculation has not been     validated in all clinical     situations.     eGFR's persistently     <90 mL/min signify     possible Chronic Kidney Disease.  POCT I-STAT TROPONIN I     Status: None   Collection Time     07/21/12  9:24 PM      Result Value Range   Troponin i, poc 0.01  0.00 - 0.08 ng/mL   Comment 3            Comment: Due to the release kinetics of cTnI,     a negative result within the first hours     of the onset of symptoms does not rule out     myocardial infarction with certainty.     If myocardial infarction is still suspected,     repeat the test at appropriate intervals.  POCT I-STAT, CHEM 8     Status: Abnormal   Collection Time    07/21/12  9:25 PM      Result Value Range   Sodium 143  135 - 145 mEq/L   Potassium 3.1 (*) 3.5 - 5.1 mEq/L   Chloride 103  96 - 112 mEq/L   BUN 14  6 - 23 mg/dL   Creatinine, Ser 8.46  0.50 - 1.10 mg/dL   Glucose, Bld 962 (*) 70 - 99 mg/dL   Calcium, Ion 9.52  8.41 - 1.30 mmol/L   TCO2 28  0 - 100 mmol/L   Hemoglobin 14.6  12.0 - 15.0 g/dL   HCT 32.4  40.1 - 02.7 %  MRSA PCR SCREENING     Status: None   Collection Time    07/21/12 10:20 PM      Result Value Range   MRSA by PCR NEGATIVE  NEGATIVE   Comment:            The GeneXpert MRSA Assay (FDA     approved for NASAL specimens     only), is one component of a     comprehensive MRSA colonization  surveillance program. It is not     intended to diagnose MRSA     infection nor to guide or     monitor treatment for     MRSA infections.  TROPONIN I     Status: Abnormal   Collection Time    07/21/12 11:37 PM      Result Value Range   Troponin I 1.51 (*) <0.30 ng/mL   Comment:            Due to the release kinetics of cTnI,     a negative result within the first hours     of the onset of symptoms does not rule out     myocardial infarction with certainty.     If myocardial infarction is still suspected,     repeat the test at appropriate intervals.     CRITICAL RESULT CALLED TO, READ BACK BY AND VERIFIED WITH:     Maricela Bo [id number] [facility]  PROTIME-INR     Status: None   Collection Time    07/21/12 11:37 PM      Result Value Range   Prothrombin Time 12.9  11.6 - 15.2  seconds   INR 0.98  0.00 - 1.49  APTT     Status: Abnormal   Collection Time    07/21/12 11:37 PM      Result Value Range   aPTT 64 (*) 24 - 37 seconds   Comment:            IF BASELINE aPTT IS ELEVATED,     SUGGEST PATIENT RISK ASSESSMENT     BE USED TO DETERMINE APPROPRIATE     ANTICOAGULANT THERAPY.  CBC WITH DIFFERENTIAL     Status: Abnormal   Collection Time    07/21/12 11:37 PM      Result Value Range   WBC 8.8  4.0 - 10.5 K/uL   RBC 4.26  3.87 - 5.11 MIL/uL   Hemoglobin 13.9  12.0 - 15.0 g/dL   HCT 11.9  14.7 - 82.9 %   MCV 92.3  78.0 - 100.0 fL   MCH 32.6  26.0 - 34.0 pg   MCHC 35.4  30.0 - 36.0 g/dL   RDW 56.2  13.0 - 86.5 %   Platelets 217  150 - 400 K/uL   Neutrophils Relative % 78 (*) 43 - 77 %   Neutro Abs 6.9  1.7 - 7.7 K/uL   Lymphocytes Relative 16  12 - 46 %   Lymphs Abs 1.4  0.7 - 4.0 K/uL   Monocytes Relative 5  3 - 12 %   Monocytes Absolute 0.5  0.1 - 1.0 K/uL   Eosinophils Relative 1  0 - 5 %   Eosinophils Absolute 0.1  0.0 - 0.7 K/uL   Basophils Relative 0  0 - 1 %   Basophils Absolute 0.0  0.0 - 0.1 K/uL  COMPREHENSIVE METABOLIC PANEL     Status: Abnormal   Collection Time    07/21/12 11:37 PM      Result Value Range   Sodium 138  135 - 145 mEq/L   Potassium 3.1 (*) 3.5 - 5.1 mEq/L   Chloride 103  96 - 112 mEq/L   CO2 23  19 - 32 mEq/L   Glucose, Bld 129 (*) 70 - 99 mg/dL   BUN 13  6 - 23 mg/dL   Creatinine, Ser 7.84  0.50 - 1.10 mg/dL   Calcium 9.0  8.4 - 69.6 mg/dL  Total Protein 6.6  6.0 - 8.3 g/dL   Albumin 3.2 (*) 3.5 - 5.2 g/dL   AST 19  0 - 37 U/L   ALT 9  0 - 35 U/L   Alkaline Phosphatase 65  39 - 117 U/L   Total Bilirubin 0.3  0.3 - 1.2 mg/dL   GFR calc non Af Amer 86 (*) >90 mL/min   GFR calc Af Amer >90  >90 mL/min   Comment:            The eGFR has been calculated     using the CKD EPI equation.     This calculation has not been     validated in all clinical     situations.     eGFR's persistently     <90 mL/min signify      possible Chronic Kidney Disease.  PRO B NATRIURETIC PEPTIDE     Status: Abnormal   Collection Time    07/21/12 11:37 PM      Result Value Range   Pro B Natriuretic peptide (BNP) 1358.0 (*) 0 - 125 pg/mL  TROPONIN I     Status: Abnormal   Collection Time    07/22/12  7:30 AM      Result Value Range   Troponin I 6.80 (*) <0.30 ng/mL   Comment:            Due to the release kinetics of cTnI,     a negative result within the first hours     of the onset of symptoms does not rule out     myocardial infarction with certainty.     If myocardial infarction is still suspected,     repeat the test at appropriate intervals.     CRITICAL VALUE NOTED.  VALUE IS CONSISTENT WITH PREVIOUSLY REPORTED AND CALLED VALUE.  LIPID PANEL     Status: Abnormal   Collection Time    07/22/12  7:30 AM      Result Value Range   Cholesterol 187  0 - 200 mg/dL   Triglycerides 87  <161 mg/dL   HDL 50  >09 mg/dL   Total CHOL/HDL Ratio 3.7     VLDL 17  0 - 40 mg/dL   LDL Cholesterol 604 (*) 0 - 99 mg/dL   Comment:            Total Cholesterol/HDL:CHD Risk     Coronary Heart Disease Risk Table                         Men   Women      1/2 Average Risk   3.4   3.3      Average Risk       5.0   4.4      2 X Average Risk   9.6   7.1      3 X Average Risk  23.4   11.0                Use the calculated Patient Ratio     above and the CHD Risk Table     to determine the patient's CHD Risk.                ATP III CLASSIFICATION (LDL):      <100     mg/dL   Optimal      540-981  mg/dL   Near or Above  Optimal      130-159  mg/dL   Borderline      409-811  mg/dL   High      >914     mg/dL   Very High  BASIC METABOLIC PANEL     Status: Abnormal   Collection Time    07/22/12  7:30 AM      Result Value Range   Sodium 140  135 - 145 mEq/L   Potassium 4.1  3.5 - 5.1 mEq/L   Chloride 109  96 - 112 mEq/L   CO2 22  19 - 32 mEq/L   Glucose, Bld 98  70 - 99 mg/dL   BUN 11  6 - 23 mg/dL    Creatinine, Ser 7.82  0.50 - 1.10 mg/dL   Calcium 8.7  8.4 - 95.6 mg/dL   GFR calc non Af Amer 89 (*) >90 mL/min   GFR calc Af Amer >90  >90 mL/min   Comment:            The eGFR has been calculated     using the CKD EPI equation.     This calculation has not been     validated in all clinical     situations.     eGFR's persistently     <90 mL/min signify     possible Chronic Kidney Disease.  CBC     Status: Abnormal   Collection Time    07/22/12  7:30 AM      Result Value Range   WBC 6.5  4.0 - 10.5 K/uL   RBC 3.77 (*) 3.87 - 5.11 MIL/uL   Hemoglobin 12.2  12.0 - 15.0 g/dL   HCT 21.3 (*) 08.6 - 57.8 %   MCV 93.4  78.0 - 100.0 fL   MCH 32.4  26.0 - 34.0 pg   MCHC 34.7  30.0 - 36.0 g/dL   RDW 46.9  62.9 - 52.8 %   Platelets 169  150 - 400 K/uL    No results found.  Review of Systems  Constitutional: Negative for fever and chills.  Respiratory: Positive for shortness of breath. Negative for wheezing.   Cardiovascular: Positive for chest pain and claudication (pain right leg when walking- "poor circulation"). Negative for orthopnea.  Genitourinary: Negative.   Neurological: Positive for speech change (in Feb- has improved) and focal weakness (right side in Feb- improved since then).  All other systems reviewed and are negative.   Blood pressure 165/84, pulse 75, temperature 98.4 F (36.9 C), temperature source Oral, resp. rate 21, height 5\' 1"  (1.549 m), weight 111 lb 12.4 oz (50.7 kg), SpO2 98.00%. Physical Exam  Vitals reviewed. Constitutional: She is oriented to person, place, and time. No distress.  thin  HENT:  Head: Normocephalic and atraumatic.  Eyes: EOM are normal. Pupils are equal, round, and reactive to light.  Neck: Neck supple. No thyromegaly present.  No audible bruit  Cardiovascular: Normal rate, regular rhythm and normal heart sounds.  Exam reveals no gallop and no friction rub.   No murmur heard. Respiratory: She has wheezes. She has no rales.   Coarse BS bilaterally  GI: Soft. There is no tenderness.  Musculoskeletal: She exhibits no edema.  Lymphadenopathy:    She has no cervical adenopathy.  Neurological: She is alert and oriented to person, place, and time.  RUE 4+/5, LUE 5/5  Skin: Skin is warm and dry.   HEMODYNAMICS:  AO SYSTOLIC/AO DIASTOLIC: 171/87  LV SYSTOLIC/LV DIASTOLIC: 182/35  ANGIOGRAPHIC  RESULTS:  1. Left main; 30% distal  2. LAD; 60-70% segmental mid followed by 90% fairly focal just beyond this. The first diagonal branch was moderate in size and had an 80-90% proximal stenosis  3. Left circumflex; is a nondominant vessel is small in caliber. It was totally occluded in its midportion and this appeared to be chronic. Gave off a moderate-sized first marginal branch. There was a 90% AV groove circumflex stenosis in the proximal third just before the takeoff of the marginal branch..  4. Right coronary artery; dominant, postop Stapley calcified with 95% stenosis on the first bend, and 90% stenosis on the distal bend but before the Genu , 70% stenosis at the crux, 95% ostial PDA stenosis.  5.LIMA with the subselectively visualized and widely patent. It was suitable for use during coronary artery bypass grafting if necessary  6. Left ventriculography; RAO left ventriculogram was performed using  25 mL of Visipaque dye at 12 mL/second. The overall LVEF estimated  45-50 % With wall motion abnormalities notable for moderate to severe inferobasal hypokinesia and moderate posterolateral hypokinesia  IMPRESSION:Ms. Clausing has three-vessel disease with mild LV dysfunction. Her circumflex is a small nondominant vessel and is occluded in its midportion. This appears chronic. She does have LAD and diagonal branch disease. I believe her culprit vessel is her dominant RCA which is patent with TIMI-3 flow. This is very tortuous and calcified and not ideal for percutaneous intervention. The patient is pain-free and hemodynamically  stable. I believe the best therapy would be coronary artery bypass grafting for complete revascularization. Patient will be treated with Aspirin, beta blocker and IIb IIIa inhibitor (Integrillin). She has not been given oral antiplatelet medications. Dr. Andrey Spearman from [facility] as has been notified.     Assessment/Plan: 62 yo woman with multiple CRF presented with a STEMi which aborted. She is currently pain free.  She has severe 3 vessel CAD and needs CABG for survival benefit and relief of symptoms.  The primary concern is her history of a possible stroke in February of this year. Her history is convincing but she did not seek out a medical evaluation at that time. With that in mind it would probably be best to have her carotids evaluated prior to CABG. That is assuming she remains stable. If she develops unstable CP again she would need to go for urgent revascularization regardless.  I discussed with her the general nature of CABG, the need for general anesthesia, and the incisions to be used. I discussed the expected hospital stay, overall recovery and short and long term outcomes. We discussed the indications, risks, benefits and alternatives. She understands the risks include but are not limited to death, stroke, MI, DVT/PE, bleeding, possible need for transfusion, infections, cardiac arrhythmias, and other organ system dysfunction including respiratory, renal, or GI complications. She accepts these risks and agrees to proceed.  HENDRICKSON,STEVEN C 07/22/2012, 11:26 AM

## 2012-07-25 NOTE — OR Nursing (Signed)
Time Out performed at 1436; leg incision at 1439. Second Time Out performed at 1448; chest incision at 1449.

## 2012-07-25 NOTE — Anesthesia Preprocedure Evaluation (Signed)
Anesthesia Evaluation  Patient identified by MRN, date of birth, ID band Patient awake    Reviewed: Allergy & Precautions, H&P , NPO status , Patient's Chart, lab work & pertinent test results, reviewed documented beta blocker date and time   Airway Mallampati: II TM Distance: >3 FB Neck ROM: Full    Dental no notable dental hx. (+) Teeth Intact and Dental Advisory Given   Pulmonary neg pulmonary ROS,  breath sounds clear to auscultation  Pulmonary exam normal       Cardiovascular hypertension, On Medications and On Home Beta Blockers + CAD and + Past MI Rhythm:Regular Rate:Normal     Neuro/Psych negative neurological ROS  negative psych ROS   GI/Hepatic negative GI ROS, Neg liver ROS,   Endo/Other  negative endocrine ROS  Renal/GU negative Renal ROS  negative genitourinary   Musculoskeletal   Abdominal   Peds  Hematology negative hematology ROS (+)   Anesthesia Other Findings   Reproductive/Obstetrics negative OB ROS                           Anesthesia Physical Anesthesia Plan  ASA: IV  Anesthesia Plan: General   Post-op Pain Management:    Induction: Intravenous  Airway Management Planned: Oral ETT  Additional Equipment: Arterial line, CVP, PA Cath and Ultrasound Guidance Line Placement  Intra-op Plan:   Post-operative Plan: Post-operative intubation/ventilation  Informed Consent: I have reviewed the patients History and Physical, chart, labs and discussed the procedure including the risks, benefits and alternatives for the proposed anesthesia with the patient or authorized representative who has indicated his/her understanding and acceptance.   Dental advisory given  Plan Discussed with: CRNA  Anesthesia Plan Comments:         Anesthesia Quick Evaluation

## 2012-07-25 NOTE — Anesthesia Postprocedure Evaluation (Signed)
  Anesthesia Post-op Note  Patient: [name] [name]  Procedure(s) Performed: Procedure(s): CORONARY ARTERY BYPASS GRAFTING (CABG) times five with Left  Endoscopic Saphenous Vein Harvest and Left Internal  Mammary  (N/A)  Patient Location: ICU  Anesthesia Type:General  Level of Consciousness: Patient remains intubated per anesthesia plan  Airway and Oxygen Therapy: Patient remains intubated per anesthesia plan and Patient placed on Ventilator (see vital sign flow sheet for setting)  Post-op Pain: none  Post-op Assessment: Post-op Vital signs reviewed, Patient's Cardiovascular Status Stable, Respiratory Function Stable, Patent Airway, No signs of Nausea or vomiting and Pain level controlled  Post-op Vital Signs: stable  Complications: No apparent anesthesia complications

## 2012-07-26 ENCOUNTER — Encounter (HOSPITAL_COMMUNITY): Payer: Self-pay | Admitting: Thoracic Surgery (Cardiothoracic Vascular Surgery)

## 2012-07-26 ENCOUNTER — Inpatient Hospital Stay (HOSPITAL_COMMUNITY): Payer: Medicaid Other

## 2012-07-26 LAB — GLUCOSE, CAPILLARY
Glucose-Capillary: 114 mg/dL — ABNORMAL HIGH (ref 70–99)
Glucose-Capillary: 139 mg/dL — ABNORMAL HIGH (ref 70–99)
Glucose-Capillary: 79 mg/dL (ref 70–99)
Glucose-Capillary: 98 mg/dL (ref 70–99)

## 2012-07-26 LAB — CBC
MCH: 31.7 pg (ref 26.0–34.0)
MCH: 32 pg (ref 26.0–34.0)
MCHC: 33.8 g/dL (ref 30.0–36.0)
MCV: 92.2 fL (ref 78.0–100.0)
Platelets: 98 10*3/uL — ABNORMAL LOW (ref 150–400)
Platelets: 103 10*3/uL — ABNORMAL LOW (ref 150–400)
RBC: 3.22 MIL/uL — ABNORMAL LOW (ref 3.87–5.11)
RDW: 14 % (ref 11.5–15.5)
WBC: 8.4 10*3/uL (ref 4.0–10.5)

## 2012-07-26 LAB — POCT I-STAT 3, ART BLOOD GAS (G3+)
Bicarbonate: 20.9 mEq/L (ref 20.0–24.0)
Bicarbonate: 20 mEq/L (ref 20.0–24.0)
O2 Saturation: 98 %
O2 Saturation: 99 %
Patient temperature: 98.4
Patient temperature: 98.6
TCO2: 22 mmol/L (ref 0–100)
TCO2: 21 mmol/L (ref 0–100)

## 2012-07-26 LAB — POCT I-STAT, CHEM 8
Calcium, Ion: 1.18 mmol/L (ref 1.13–1.30)
Chloride: 106 mEq/L (ref 96–112)
Glucose, Bld: 138 mg/dL — ABNORMAL HIGH (ref 70–99)
HCT: 29 % — ABNORMAL LOW (ref 36.0–46.0)
Hemoglobin: 9.9 g/dL — ABNORMAL LOW (ref 12.0–15.0)

## 2012-07-26 LAB — BASIC METABOLIC PANEL
CO2: 22 mEq/L (ref 19–32)
Calcium: 8 mg/dL — ABNORMAL LOW (ref 8.4–10.5)
Chloride: 111 mEq/L (ref 96–112)
Creatinine, Ser: 0.72 mg/dL (ref 0.50–1.10)
GFR calc Af Amer: >90 mL/min (ref >90)
Sodium: 141 mEq/L (ref 135–145)

## 2012-07-26 LAB — CREATININE, SERUM
Creatinine, Ser: 0.77 mg/dL (ref 0.50–1.10)
GFR calc non Af Amer: 87 mL/min — ABNORMAL LOW (ref >90)

## 2012-07-26 LAB — MAGNESIUM
Magnesium: 2.8 mg/dL — ABNORMAL HIGH (ref 1.5–2.5)
Magnesium: 2.3 mg/dL (ref 1.5–2.5)

## 2012-07-26 MED ORDER — ALBUMIN HUMAN 5 % IV SOLN
12.5000 g | Freq: Once | INTRAVENOUS | Status: AC
  Administered 2012-07-26: 12.5 g via INTRAVENOUS
  Filled 2012-07-26: qty 250

## 2012-07-26 MED ORDER — FUROSEMIDE 10 MG/ML IJ SOLN
20.0000 mg | Freq: Once | INTRAMUSCULAR | Status: AC
  Administered 2012-07-26: 20 mg via INTRAVENOUS
  Filled 2012-07-26: qty 2

## 2012-07-26 MED ORDER — INSULIN ASPART 100 UNIT/ML ~~LOC~~ SOLN
0.0000 [IU] | SUBCUTANEOUS | Status: DC
  Administered 2012-07-26: 2 [IU] via SUBCUTANEOUS

## 2012-07-26 MED FILL — Magnesium Sulfate Inj 50%: INTRAMUSCULAR | Qty: 10 | Status: AC

## 2012-07-26 MED FILL — Heparin Sodium (Porcine) Inj 1000 Unit/ML: INTRAMUSCULAR | Qty: 30 | Status: AC

## 2012-07-26 MED FILL — Sodium Chloride IV Soln 0.9%: INTRAVENOUS | Qty: 1000 | Status: AC

## 2012-07-26 MED FILL — Potassium Chloride Inj 2 mEq/ML: INTRAVENOUS | Qty: 20 | Status: AC

## 2012-07-26 NOTE — Progress Notes (Signed)
Hypoglycemic Event  CBG: 69  Treatment: 15 GM carbohydrate snack  Symptoms: None  Follow-up CBG: Time:0900 CBG Result 79  Possible Reasons for Event: Unknown  Comments/MD notified:Hendrickson    [name] [name]  Remember to initiate Hypoglycemia Order Set & complete

## 2012-07-26 NOTE — Progress Notes (Signed)
1 Day Post-Op Procedure(s) (LRB): CORONARY ARTERY BYPASS GRAFTING (CABG) times five with Left  Endoscopic Saphenous Vein Harvest and Left Internal  Mammary  (N/A) Subjective: Some incisional pain Transient nausea earlier  Objective: Vital signs in last 24 hours: Temp:  [96.4 F (35.8 C)-99.1 F (37.3 C)] 99.1 F (37.3 C) (06/25 0700) Pulse Rate:  [48-80] 80 (06/25 0700) Cardiac Rhythm:  [-] Atrial paced (06/25 0600) Resp:  [10-23] 15 (06/25 0700) BP: (79-157)/(56-86) 96/67 mmHg (06/25 0700) SpO2:  [97 %-100 %] 100 % (06/25 0700) Arterial Line BP: (86-154)/(50-90) 108/62 mmHg (06/25 0700) FiO2 (%):  [40 %-50 %] 40 % (06/25 0500) Weight:  [120 lb 13 oz (54.8 kg)] 120 lb 13 oz (54.8 kg) (06/25 0700)  Hemodynamic parameters for last 24 hours: PAP: (21-36)/(5-21) 36/21 mmHg CO:  [2.4 L/min-3.3 L/min] 3.3 L/min CI:  [1.6 L/min/m2-2.2 L/min/m2] 2.2 L/min/m2  Intake/Output from previous day: 06/24 0701 - 06/25 0700 In: 4899.8 [P.O.:60; I.V.:2764.8; Blood:375; NG/GT:90; IV Piggyback:1610] Out: 6165 [Urine:4300; Emesis/NG output:50; Blood:1615; Chest Tube:200] Intake/Output this shift:    General appearance: alert and no distress Neurologic: intact Heart: regular rate and rhythm Lungs: diminished breath sounds bibasilar Abdomen: normal findings: soft, non-tender  Lab Results:  Recent Labs  07/25/12 1900 07/25/12 1920 07/26/12 0415  WBC 6.3  --  8.4  HGB 9.8* 9.9* 10.2*  HCT 28.1* 29.0* 29.7*  PLT 80*  --  98*   BMET:  Recent Labs  07/25/12 0550  07/25/12 1920 07/26/12 0415  NA 141  < > 140 141  K 4.1  < > 3.4* 3.9  CL 106  --   --  111  CO2 23  --   --  22  GLUCOSE 89  < > 88 127*  BUN 16  --   --  10  CREATININE 0.68  --   --  0.72  CALCIUM 9.2  --   --  8.0*  < > = values in this interval not displayed.  PT/INR:  Recent Labs  07/25/12 1900  LABPROT 17.5*  INR 1.48   ABG    Component Value Date/Time   PHART 7.419 07/26/2012 0525   HCO3 20.0 07/26/2012  0525   TCO2 21 07/26/2012 0525   ACIDBASEDEF 4.0* 07/26/2012 0525   O2SAT 99.0 07/26/2012 0525   CBG (last 3)   Recent Labs  07/26/12 0420 07/26/12 0807 07/26/12 0819  GLUCAP 140* 69* 65*    Assessment/Plan: S/P Procedure(s) (LRB): CORONARY ARTERY BYPASS GRAFTING (CABG) times five with Left  Endoscopic Saphenous Vein Harvest and Left Internal  Mammary  (N/A) POD # 1 CABG x 5 CV- good cardiac output- dc swan  Relatively hypotensive- will give albumin to expand intravascular volume  RESP- pulmonary hygiene  RENAL- lytes, creatinine OK, diurese as BP allows  ENDO- CBG low this AM- monitor  DC CT  Anemia- secondary to ABL- monitor  Thrombocytopenia- mild- will not start enoxaparin for now   LOS: 5 days    Sion Reinders C 07/26/2012

## 2012-07-26 NOTE — Procedures (Signed)
Extubation Procedure Note  Patient Details:   Name: [name] [name] DOB: [birth year] MRN: [id number]   Airway Documentation:   Patient extubated to 4 lpm nasal cannula.  VC , NIF -40, patient able to lift and hold head off bed.  Patient able to breathe around deflated cuff and vocalize post procedure.  Tolerated well, no complications.   Evaluation  O2 sats: stable throughout Complications: No apparent complications Patient did tolerate procedure well. Bilateral Breath Sounds: Clear;Diminished   Yes  [name] [name], Aloha Gell 07/26/2012, 5:43 AM

## 2012-07-26 NOTE — Progress Notes (Signed)
T. CTS p.m. Rounds  Patient examined and record reviewed.Hemodynamics stable,labs satisfactory.Patient had stable day.Continue current care. VAN TRIGT III,Toan Mort 07/26/2012

## 2012-07-26 NOTE — Progress Notes (Signed)
[facility]  Subjective: No complaints.  Objective: Vital signs in last 24 hours: Temp:  [96.4 F (35.8 C)-99.1 F (37.3 C)] 98.1 F (36.7 C) (06/25 0900) Pulse Rate:  [48-80] 80 (06/25 0800) Resp:  [10-23] 20 (06/25 0900) BP: (79-157)/(56-86) 96/67 mmHg (06/25 0900) SpO2:  [97 %-100 %] 100 % (06/25 0800) Arterial Line BP: (86-154)/(50-90) 101/58 mmHg (06/25 0900) FiO2 (%):  [40 %-50 %] 40 % (06/25 0500) Weight:  [120 lb 13 oz (54.8 kg)] 120 lb 13 oz (54.8 kg) (06/25 0700) Last BM Date: 07/24/12  Intake/Output from previous day: 06/24 0701 - 06/25 0700 In: 4899.8 [P.O.:60; I.V.:2764.8; Blood:375; NG/GT:90; IV Piggyback:1610] Out: 6165 [Urine:4300; Emesis/NG output:50; Blood:1615; Chest Tube:200] Intake/Output this shift: Total I/O In: 187.5 [P.O.:120; I.V.:67.5] Out: 70 [Urine:40; Chest Tube:30]  Medications Current Facility-Administered Medications  Medication Dose Route Frequency Provider Last Rate Last Dose  . 0.45 % sodium chloride infusion   Intravenous Continuous Erin Barrett, PA-C 20 mL/hr at 07/26/12 0400    . 0.9 %  sodium chloride infusion   Intravenous Continuous Erin Barrett, PA-C 20 mL/hr at 07/25/12 1945 20 mL at 07/25/12 1945  . 0.9 %  sodium chloride infusion  250 mL Intravenous Continuous Erin Barrett, PA-C      . acetaminophen (TYLENOL) tablet 1,000 mg  1,000 mg Oral Q6H Erin Barrett, PA-C       Or  . acetaminophen (TYLENOL) solution 975 mg  975 mg Per Tube Q6H Erin Barrett, PA-C      . albumin human 5 % solution 12.5 g  12.5 g Intravenous Once Loreli Slot, MD      . albumin human 5 % solution 250 mL  250 mL Intravenous Q15 min PRN Erin Barrett, PA-C   250 mL at 07/26/12 0600  . aspirin EC tablet 325 mg  325 mg Oral Daily Erin Barrett, PA-C       Or  . aspirin chewable tablet 324 mg  324 mg Per Tube Daily Erin Barrett, PA-C      . atorvastatin (LIPITOR) tablet 80 mg  80 mg Oral q1800 Leeann Must, MD   80 mg at  07/24/12 1800  . bisacodyl (DULCOLAX) EC tablet 10 mg  10 mg Oral Daily Erin Barrett, PA-C       Or  . bisacodyl (DULCOLAX) suppository 10 mg  10 mg Rectal Daily Erin Barrett, PA-C      . cefUROXime (ZINACEF) 1.5 g in dextrose 5 % 50 mL IVPB  1.5 g Intravenous Q12H Erin Barrett, PA-C   1.5 g at 07/26/12 0844  . dexmedetomidine (PRECEDEX) 200 MCG/50ML infusion  0.1-0.7 mcg/kg/hr Intravenous Continuous Erin Barrett, PA-C   0.3 mcg/kg/hr at 07/25/12 2300  . docusate sodium (COLACE) capsule 200 mg  200 mg Oral Daily Erin Barrett, PA-C      . famotidine (PEPCID) IVPB 20 mg  20 mg Intravenous Q12H Erin Barrett, PA-C   20 mg at 07/25/12 2200  . furosemide (LASIX) injection 20 mg  20 mg Intravenous Once Loreli Slot, MD      . insulin aspart (novoLOG) injection 0-24 Units  0-24 Units Subcutaneous Q4H Loreli Slot, MD      . lactated ringers infusion   Intravenous Continuous Erin Barrett, PA-C 40 mL/hr at 07/26/12 0400    . metoCLOPramide (REGLAN) injection 10 mg  10 mg Intravenous Q6H Erin Barrett, PA-C   10 mg at 07/26/12 0600  . metoprolol (LOPRESSOR) injection 2.5-5 mg  2.5-5  mg Intravenous Q2H PRN Erin Barrett, PA-C      . metoprolol tartrate (LOPRESSOR) tablet 12.5 mg  12.5 mg Oral BID Erin Barrett, PA-C       Or  . metoprolol tartrate (LOPRESSOR) 25 mg/10 mL oral suspension 12.5 mg  12.5 mg Per Tube BID Erin Barrett, PA-C      . midazolam (VERSED) injection 2 mg  2 mg Intravenous Q1H PRN Erin Barrett, PA-C   2 mg at 07/26/12 0243  . morphine 2 MG/ML injection 2-5 mg  2-5 mg Intravenous Q1H PRN Erin Barrett, PA-C   2 mg at 07/26/12 0209  . nitroGLYCERIN 0.2 mg/mL in dextrose 5 % infusion  0-100 mcg/min Intravenous Continuous Erin Barrett, PA-C   25 mcg/min at 07/25/12 2300  . ondansetron (ZOFRAN) injection 4 mg  4 mg Intravenous Q6H PRN Erin Barrett, PA-C   4 mg at 07/26/12 0745  . oxyCODONE (Oxy IR/ROXICODONE) immediate release tablet 5-10 mg  5-10 mg Oral Q3H PRN Erin Barrett, PA-C    10 mg at 07/26/12 0639  . [START ON 07/27/2012] pantoprazole (PROTONIX) EC tablet 40 mg  40 mg Oral Daily Erin Barrett, PA-C      . phenylephrine (NEO-SYNEPHRINE) 20,000 mcg in dextrose 5 % 250 mL infusion  0-100 mcg/min Intravenous Continuous Erin Barrett, PA-C 7.5 mL/hr at 07/26/12 0800 10 mcg/min at 07/26/12 0800  . sodium chloride 0.9 % injection 3 mL  3 mL Intravenous Q12H Erin Barrett, PA-C      . sodium chloride 0.9 % injection 3 mL  3 mL Intravenous PRN Erin Barrett, PA-C        PE: General appearance: alert, cooperative and no distress Lungs: decreased BS bilaterally Heart: regular rate and rhythm Extremities: no LEE Pulses: 2+ and symmetric Skin: warm and dry Neurologic: Grossly normal  Lab Results:   Recent Labs  07/25/12 0550  07/25/12 1719  07/25/12 1900 07/25/12 1920 07/26/12 0415  WBC 6.2  --   --   --  6.3  --  8.4  HGB 12.8  < > 8.6*  < > 9.8* 9.9* 10.2*  HCT 37.3  < > 24.5*  < > 28.1* 29.0* 29.7*  PLT 188  --  113*  --  80*  --  98*  < > = values in this interval not displayed. BMET  Recent Labs  07/24/12 0437 07/25/12 0550  07/25/12 1817 07/25/12 1920 07/26/12 0415  NA 140 141  < > 137 140 141  K 4.4 4.1  < > 4.0 3.4* 3.9  CL 106 106  --   --   --  111  CO2 25 23  --   --   --  22  GLUCOSE 82 89  < > 84 88 127*  BUN 12 16  --   --   --  10  CREATININE 0.72 0.68  --   --   --  0.72  CALCIUM 8.9 9.2  --   --   --  8.0*  < > = values in this interval not displayed. PT/INR  Recent Labs  07/25/12 1900  LABPROT 17.5*  INR 1.48    Assessment/Plan  Active Problems:   STEMI (ST elevation myocardial infarction), secondary to disease of RCA, with disease of LAD and diag. branch and chronic non dominant LCX occlusion for CABG    Essential hypertension   Tobacco abuse   Hyperlipidemia LDL goal < 70  Plan: Day 1 S/P CABG x 5 (left internal mammary artery  to LAD, sequential saphenous vein graft to obtuse marginal 1, obtuse marginal 2, and  posterior descending with Y graft to 1st diagonal). In SICU and on phenylephrine for pressor support. SBP in the 90s. No chest pain. No post-operative arrhthymias noted on telemetry. HR is stable. Continue with current plan of care, per TCTS. Will continue to follow along and will make recommendations as needed.    LOS: 5 days    Brittainy M. Delmer Islam 07/26/2012 9:54 AM    Patient seen and examined. Agree with assessment and plan. Day 1 s/p CABG. Doing well. Alert.  ECG today NSR at 67, IRBBB, small Q III. Currently A paced at 80. Getting albumin, still on neosynephrine.    Lennette Bihari, MD, [facility] 07/26/2012 10:24 AM

## 2012-07-26 NOTE — Progress Notes (Signed)
UR Completed.  [name] [phone] 07/26/2012  

## 2012-07-27 ENCOUNTER — Inpatient Hospital Stay (HOSPITAL_COMMUNITY): Payer: Medicaid Other

## 2012-07-27 DIAGNOSIS — I2510 Atherosclerotic heart disease of native coronary artery without angina pectoris: Secondary | ICD-10-CM

## 2012-07-27 LAB — CBC
MCHC: 34.1 g/dL (ref 30.0–36.0)
RDW: 14.7 % (ref 11.5–15.5)

## 2012-07-27 LAB — BASIC METABOLIC PANEL
BUN: 10 mg/dL (ref 6–23)
Creatinine, Ser: 0.65 mg/dL (ref 0.50–1.10)
GFR calc Af Amer: >90 mL/min (ref >90)
GFR calc non Af Amer: >90 mL/min (ref >90)

## 2012-07-27 LAB — GLUCOSE, CAPILLARY
Glucose-Capillary: 112 mg/dL — ABNORMAL HIGH (ref 70–99)
Glucose-Capillary: 95 mg/dL (ref 70–99)
Glucose-Capillary: 59 mg/dL — ABNORMAL LOW (ref 70–99)
Glucose-Capillary: 111 mg/dL — ABNORMAL HIGH (ref 70–99)

## 2012-07-27 MED ORDER — MAGNESIUM HYDROXIDE 400 MG/5ML PO SUSP: 30.0000 mL | Freq: Every day | ORAL | Status: DC | PRN

## 2012-07-27 MED ORDER — POTASSIUM CHLORIDE CRYS ER 20 MEQ PO TBCR
20.0000 meq | EXTENDED_RELEASE_TABLET | Freq: Two times a day (BID) | ORAL | Status: DC
  Administered 2012-07-27 (×2): 20 meq via ORAL
  Filled 2012-07-27 (×4): qty 1

## 2012-07-27 MED ORDER — METOPROLOL TARTRATE 25 MG/10 ML ORAL SUSPENSION
25.0000 mg | Freq: Two times a day (BID) | ORAL | Status: DC
  Filled 2012-07-27 (×8): qty 10

## 2012-07-27 MED ORDER — GUAIFENESIN-DM 100-10 MG/5ML PO SYRP: 15.0000 mL | ORAL_SOLUTION | ORAL | Status: DC | PRN

## 2012-07-27 MED ORDER — FUROSEMIDE 40 MG PO TABS
40.0000 mg | ORAL_TABLET | Freq: Every day | ORAL | Status: DC
  Administered 2012-07-27 – 2012-07-31 (×5): 40 mg via ORAL
  Filled 2012-07-27 (×5): qty 1

## 2012-07-27 MED ORDER — MOVING RIGHT ALONG BOOK
Freq: Once | Status: AC
  Administered 2012-07-27: 13:00:00
  Filled 2012-07-27: qty 1

## 2012-07-27 MED ORDER — ENSURE COMPLETE PO LIQD
237.0000 mL | Freq: Three times a day (TID) | ORAL | Status: DC
  Administered 2012-07-28 – 2012-07-31 (×8): 237 mL via ORAL

## 2012-07-27 MED ORDER — SODIUM CHLORIDE 0.9 % IJ SOLN: 3.0000 mL | INTRAMUSCULAR | Status: DC | PRN

## 2012-07-27 MED ORDER — METOPROLOL TARTRATE 25 MG PO TABS
25.0000 mg | ORAL_TABLET | Freq: Two times a day (BID) | ORAL | Status: DC
  Administered 2012-07-27 – 2012-07-29 (×6): 25 mg via ORAL
  Filled 2012-07-27 (×8): qty 1

## 2012-07-27 MED ORDER — TRAMADOL HCL 50 MG PO TABS
50.0000 mg | ORAL_TABLET | Freq: Four times a day (QID) | ORAL | Status: DC | PRN
  Administered 2012-07-27 – 2012-07-28 (×2): 50 mg via ORAL
  Administered 2012-07-28: 100 mg via ORAL
  Administered 2012-07-28: 50 mg via ORAL
  Administered 2012-07-29 – 2012-07-30 (×2): 100 mg via ORAL
  Administered 2012-07-30: 50 mg via ORAL
  Administered 2012-07-31: 100 mg via ORAL
  Filled 2012-07-27 (×2): qty 2
  Filled 2012-07-27 (×5): qty 1
  Filled 2012-07-27 (×2): qty 2
  Filled 2012-07-27: qty 1

## 2012-07-27 MED ORDER — SODIUM CHLORIDE 0.9 % IV SOLN: 250.0000 mL | INTRAVENOUS | Status: DC | PRN

## 2012-07-27 MED ORDER — ALUM & MAG HYDROXIDE-SIMETH 200-200-20 MG/5ML PO SUSP: 15.0000 mL | ORAL | Status: DC | PRN

## 2012-07-27 MED ORDER — INSULIN ASPART 100 UNIT/ML ~~LOC~~ SOLN: 0.0000 [IU] | Freq: Three times a day (TID) | SUBCUTANEOUS | Status: DC

## 2012-07-27 MED ORDER — SODIUM CHLORIDE 0.9 % IJ SOLN
3.0000 mL | Freq: Two times a day (BID) | INTRAMUSCULAR | Status: DC
  Administered 2012-07-27 – 2012-07-30 (×7): 3 mL via INTRAVENOUS

## 2012-07-27 MED FILL — Heparin Sodium (Porcine) Inj 1000 Unit/ML: INTRAMUSCULAR | Qty: 10 | Status: AC

## 2012-07-27 MED FILL — Lidocaine HCl IV Inj 20 MG/ML: INTRAVENOUS | Qty: 5 | Status: AC

## 2012-07-27 MED FILL — Electrolyte-R (PH 7.4) Solution: INTRAVENOUS | Qty: 4000 | Status: AC

## 2012-07-27 MED FILL — Sodium Bicarbonate IV Soln 8.4%: INTRAVENOUS | Qty: 50 | Status: AC

## 2012-07-27 MED FILL — Sodium Chloride Irrigation Soln 0.9%: Qty: 3000 | Status: AC

## 2012-07-27 MED FILL — Mannitol IV Soln 20%: INTRAVENOUS | Qty: 500 | Status: AC

## 2012-07-27 NOTE — Progress Notes (Signed)
1500  Came to walk with pt. Stated her sugar was low earlier and she still was not feeling very well. Agreed to walk with staff later. Will follow up tomorrow. Luetta Nutting RNBSN

## 2012-07-27 NOTE — Progress Notes (Signed)
Pt ambulated in hallway 550 feet using rolling walker, O2 2 liters &  accompained by rn & NT. Tolerated well [name] [name] A

## 2012-07-27 NOTE — Progress Notes (Signed)
[facility]  Subjective: No chest pain or SOB. She has been using incentive spirometer. She ambulated yesterday w/o difficulty.  Objective: Vital signs in last 24 hours: Temp:  [97.7 F (36.5 C)-98.6 F (37 C)] 98.6 F (37 C) (06/26 0758) Pulse Rate:  [80-109] 80 (06/26 0700) Resp:  [17-25] 18 (06/26 0800) BP: (90-131)/(55-88) 124/71 mmHg (06/26 0800) SpO2:  [90 %-100 %] 100 % (06/26 0700) Arterial Line BP: (87-145)/(39-74) 126/55 mmHg (06/26 0800) Weight:  [121 lb 4.1 oz (55 kg)] 121 lb 4.1 oz (55 kg) (06/26 0600) Last BM Date: 07/24/12  Intake/Output from previous day: 06/25 0701 - 06/26 0700 In: 1951.4 [P.O.:540; I.V.:1011.4; IV Piggyback:400] Out: 895 [Urine:815; Chest Tube:80] Intake/Output this shift: Total I/O In: 40 [I.V.:40] Out: 40 [Urine:40]  Medications Current Facility-Administered Medications  Medication Dose Route Frequency Provider Last Rate Last Dose  . 0.45 % sodium chloride infusion   Intravenous Continuous Erin Barrett, PA-C 20 mL/hr at 07/26/12 0400    . 0.9 %  sodium chloride infusion   Intravenous Continuous Erin Barrett, PA-C 20 mL/hr at 07/25/12 1945 20 mL at 07/25/12 1945  . 0.9 %  sodium chloride infusion  250 mL Intravenous Continuous Erin Barrett, PA-C      . acetaminophen (TYLENOL) tablet 1,000 mg  1,000 mg Oral Q6H Erin Barrett, PA-C   1,000 mg at 07/26/12 1738   Or  . acetaminophen (TYLENOL) solution 975 mg  975 mg Per Tube Q6H Erin Barrett, PA-C      . aspirin EC tablet 325 mg  325 mg Oral Daily Erin Barrett, PA-C   325 mg at 07/26/12 1001   Or  . aspirin chewable tablet 324 mg  324 mg Per Tube Daily Erin Barrett, PA-C      . atorvastatin (LIPITOR) tablet 80 mg  80 mg Oral q1800 Leeann Must, MD   80 mg at 07/26/12 1739  . bisacodyl (DULCOLAX) EC tablet 10 mg  10 mg Oral Daily Erin Barrett, PA-C   10 mg at 07/26/12 1001   Or  . bisacodyl (DULCOLAX) suppository 10 mg  10 mg Rectal Daily Erin Barrett, PA-C       . cefUROXime (ZINACEF) 1.5 g in dextrose 5 % 50 mL IVPB  1.5 g Intravenous Q12H Erin Barrett, PA-C   1.5 g at 07/26/12 2200  . dexmedetomidine (PRECEDEX) 200 MCG/50ML infusion  0.1-0.7 mcg/kg/hr Intravenous Continuous Erin Barrett, PA-C   0.3 mcg/kg/hr at 07/25/12 2300  . docusate sodium (COLACE) capsule 200 mg  200 mg Oral Daily Erin Barrett, PA-C   200 mg at 07/26/12 1001  . furosemide (LASIX) tablet 40 mg  40 mg Oral Daily Loreli Slot, MD      . insulin aspart (novoLOG) injection 0-9 Units  0-9 Units Subcutaneous TID WC Loreli Slot, MD      . lactated ringers infusion   Intravenous Continuous Erin Barrett, PA-C 40 mL/hr at 07/27/12 0400    . metoprolol (LOPRESSOR) injection 2.5-5 mg  2.5-5 mg Intravenous Q2H PRN Erin Barrett, PA-C      . metoprolol tartrate (LOPRESSOR) tablet 25 mg  25 mg Oral BID Loreli Slot, MD       Or  . metoprolol tartrate (LOPRESSOR) 25 mg/10 mL oral suspension 25 mg  25 mg Per Tube BID Loreli Slot, MD      . midazolam (VERSED) injection 2 mg  2 mg Intravenous Q1H PRN Erin Barrett, PA-C   2 mg at 07/26/12  1610  . morphine 2 MG/ML injection 2-5 mg  2-5 mg Intravenous Q1H PRN Erin Barrett, PA-C   4 mg at 07/27/12 0338  . nitroGLYCERIN 0.2 mg/mL in dextrose 5 % infusion  0-100 mcg/min Intravenous Continuous Erin Barrett, PA-C   25 mcg/min at 07/25/12 2300  . ondansetron (ZOFRAN) injection 4 mg  4 mg Intravenous Q6H PRN Erin Barrett, PA-C   4 mg at 07/27/12 0343  . pantoprazole (PROTONIX) EC tablet 40 mg  40 mg Oral Daily Erin Barrett, PA-C      . phenylephrine (NEO-SYNEPHRINE) 20,000 mcg in dextrose 5 % 250 mL infusion  0-100 mcg/min Intravenous Continuous Erin Barrett, PA-C   5 mcg/min at 07/26/12 2000  . potassium chloride SA (K-DUR,KLOR-CON) CR tablet 20 mEq  20 mEq Oral BID Loreli Slot, MD      . sodium chloride 0.9 % injection 3 mL  3 mL Intravenous Q12H Erin Barrett, PA-C   3 mL at 07/26/12 2200  . sodium chloride 0.9 %  injection 3 mL  3 mL Intravenous PRN Erin Barrett, PA-C      . traMADol (ULTRAM) tablet 50-100 mg  50-100 mg Oral Q6H PRN Loreli Slot, MD        PE: General appearance: alert, cooperative and no distress Lungs: decreased BS a bilateral bases R >L Heart: regular rate and rhythm Extremities: no LEE Pulses: 2+ and symmetric Skin: warm and dry Neurologic: Grossly normal  Lab Results:   Recent Labs  07/26/12 0415 07/26/12 1700 07/26/12 1728 07/27/12 0430  WBC 8.4 10.2  --  9.1  HGB 10.2* 9.6* 9.9* 9.3*  HCT 29.7* 28.4* 29.0* 27.3*  PLT 98* 103*  --  95*   BMET  Recent Labs  07/25/12 0550  07/26/12 0415 07/26/12 1700 07/26/12 1728 07/27/12 0430  NA 141  < > 141  --  139 136  K 4.1  < > 3.9  --  4.0 4.1  CL 106  --  111  --  106 104  CO2 23  --  22  --   --  24  GLUCOSE 89  < > 127*  --  138* 119*  BUN 16  --  10  --  9 10  CREATININE 0.68  --  0.72 0.77 0.80 0.65  CALCIUM 9.2  --  8.0*  --   --  8.3*  < > = values in this interval not displayed. PT/INR  Recent Labs  07/25/12 1900  LABPROT 17.5*  INR 1.48    Assessment/Plan  Active Problems:   STEMI (ST elevation myocardial infarction), secondary to disease of RCA, with disease of LAD and diag. branch and chronic non dominant LCX occlusion for CABG    Essential hypertension   Tobacco abuse   Hyperlipidemia LDL goal < 70  Plan: Day 1 S/P CABG x 5. Progressing well. Neo has been discontinued. She is a bit hypotensive, but asymptomatic. No arrhthymias on telemetry. No further chest pain. She has been ambulating with CR with little difficulty. Plan for transfer to telemetry floor today. May need to hold BB if hypotension continues. Will continue to follow.    LOS: 6 days    Brittainy M. Delmer Islam 07/27/2012 9:07 AM  Agree with note written by Boyce Medici  PAC  POD # 2 CABG after aborted inf STEMI. Looks great off pressors. VSS. No Cp/SOB/ Labs OK. Exam benign. Nl progression. Transfer to  tele per TCTS. Appreciate Dr. Sunday Corn help.  [name]  07/27/2012 10:33 AM

## 2012-07-27 NOTE — Plan of Care (Signed)
Problem: Phase III Progression Outcomes Goal: Time patient transferred to PCTU/Telemetry POD Outcome: Completed/Met Date Met:  07/27/12 1220 Transferred to 2001 ambulated from 2311 to 2001 Portable monitor and oxygen on. Tolerated well.  No changes.

## 2012-07-27 NOTE — Progress Notes (Signed)
2 Days Post-Op Procedure(s) (LRB): CORONARY ARTERY BYPASS GRAFTING (CABG) times five with Left  Endoscopic Saphenous Vein Harvest and Left Internal  Mammary  (N/A) Subjective: Confusion last night. Per report began around 9 PM and resolved around 2 AM No complaints this morning "I don't know what happened last night" Denies pain  Objective: Vital signs in last 24 hours: Temp:  [97.7 F (36.5 C)-98.6 F (37 C)] 98.6 F (37 C) (06/26 0758) Pulse Rate:  [80-109] 80 (06/26 0700) Cardiac Rhythm:  [-] Atrial paced (06/26 0600) Resp:  [17-25] 20 (06/26 0700) BP: (90-131)/(55-88) 109/70 mmHg (06/26 0700) SpO2:  [90 %-100 %] 100 % (06/26 0700) Arterial Line BP: (87-145)/(39-74) 125/54 mmHg (06/26 0700) Weight:  [121 lb 4.1 oz (55 kg)] 121 lb 4.1 oz (55 kg) (06/26 0600)  Hemodynamic parameters for last 24 hours: PAP: (37-39)/(19-22) 38/19 mmHg  Intake/Output from previous day: 06/25 0701 - 06/26 0700 In: 1951.4 [P.O.:540; I.V.:1011.4; IV Piggyback:400] Out: 895 [Urine:815; Chest Tube:80] Intake/Output this shift:    General appearance: alert and no distress Neurologic: intact Heart: regular rate and rhythm Lungs: diminished breath sounds bibasilar Abdomen: normal findings: soft, non-tender  Lab Results:  Recent Labs  07/26/12 1700 07/26/12 1728 07/27/12 0430  WBC 10.2  --  9.1  HGB 9.6* 9.9* 9.3*  HCT 28.4* 29.0* 27.3*  PLT 103*  --  95*   BMET:  Recent Labs  07/26/12 0415  07/26/12 1728 07/27/12 0430  NA 141  --  139 136  K 3.9  --  4.0 4.1  CL 111  --  106 104  CO2 22  --   --  24  GLUCOSE 127*  --  138* 119*  BUN 10  --  9 10  CREATININE 0.72  < > 0.80 0.65  CALCIUM 8.0*  --   --  8.3*  < > = values in this interval not displayed.  PT/INR:  Recent Labs  07/25/12 1900  LABPROT 17.5*  INR 1.48   ABG    Component Value Date/Time   PHART 7.419 07/26/2012 0525   HCO3 20.0 07/26/2012 0525   TCO2 19 07/26/2012 1728   ACIDBASEDEF 4.0* 07/26/2012 0525   O2SAT 99.0 07/26/2012 0525   CBG (last 3)   Recent Labs  07/26/12 2341 07/27/12 0427 07/27/12 0800  GLUCAP 98 112* 95    Assessment/Plan: S/P Procedure(s) (LRB): CORONARY ARTERY BYPASS GRAFTING (CABG) times five with Left  Endoscopic Saphenous Vein Harvest and Left Internal  Mammary  (N/A) Plan for transfer to step-down: see transfer orders Neuro- confusion last PM- no clear source, may be sleep deprivation, pain meds, etc.  Will change oxycodone to ultram  Follow  CV- stable- in SR- dc external pacer  Lopressor, statin, ASA  RESP_ bibasilar platelike atelectasis- L > R- IS  RENAL- lytes, creatinine OK  ENDO- CBG well controlled- change to [facility] and HS  Thrombocytopenia- no heparin- SCD for DVT prophylaxis  Anemia secondary to ABL- mild, follow  LOS: 6 days    Ryhanna Dunsmore C 07/27/2012

## 2012-07-27 NOTE — Progress Notes (Signed)
Hypoglycemic Event  CBG: 59  Treatment: 15 GM carbohydrate snack  Symptoms: None  Follow-up CBG: Time:1327 CBG Result:102  Possible Reasons for Event: Inadequate meal intake  Comments/MD notified:diet advanced      [name] [name] E  Remember to initiate Hypoglycemia Order Set & complete

## 2012-07-28 ENCOUNTER — Inpatient Hospital Stay (HOSPITAL_COMMUNITY): Payer: Medicaid Other

## 2012-07-28 DIAGNOSIS — Z951 Presence of aortocoronary bypass graft: Secondary | ICD-10-CM

## 2012-07-28 HISTORY — DX: Presence of aortocoronary bypass graft: Z95.1

## 2012-07-28 LAB — CBC
MCH: 31.6 pg (ref 26.0–34.0)
MCHC: 33.2 g/dL (ref 30.0–36.0)
MCV: 95.2 fL (ref 78.0–100.0)
Platelets: 98 10*3/uL — ABNORMAL LOW (ref 150–400)
RDW: 14.8 % (ref 11.5–15.5)
WBC: 9.9 10*3/uL (ref 4.0–10.5)

## 2012-07-28 LAB — TYPE AND SCREEN
ABO/RH(D): O POS
Antibody Screen: NEGATIVE
Unit division: 0

## 2012-07-28 LAB — BASIC METABOLIC PANEL
Calcium: 8.5 mg/dL (ref 8.4–10.5)
Creatinine, Ser: 0.58 mg/dL (ref 0.50–1.10)
GFR calc Af Amer: >90 mL/min (ref >90)
GFR calc non Af Amer: >90 mL/min (ref >90)

## 2012-07-28 LAB — GLUCOSE, CAPILLARY
Glucose-Capillary: 94 mg/dL (ref 70–99)
Glucose-Capillary: 98 mg/dL (ref 70–99)
Glucose-Capillary: 96 mg/dL (ref 70–99)

## 2012-07-28 NOTE — Progress Notes (Signed)
Pt. Seen and examined. Agree with the NP/PA-C note as written.  No cardiac complaints. Maintaining sinus. BP should be able to tolerate ACE-I before discharge, hopefully this weekend. Ambulating without difficulty.  Continue lasix for bilateral pleural effusions, breathing has improved.  Chrystie Nose, MD, [facility] Attending Cardiologist [facility]

## 2012-07-28 NOTE — Progress Notes (Signed)
Ed completed with family present. Requests her name be sent to [facility]. Motivated to quit smoking. [phone] [name] [name] CES, ACSM 3:38 PM 07/28/2012

## 2012-07-28 NOTE — Progress Notes (Signed)
Pt started feeling a little dizzy.  VS and CBG were stable, but her o2 sat was 88% on room air at the highest.  Moving, she was at 85% on R/A.  I placed her back on 2L of oxygen, as it was taken off earlier.

## 2012-07-28 NOTE — Progress Notes (Signed)
[facility]  Subjective: No complaints. She is pleased with her progress. Has been ambulating with cardiac rehab with little difficulty.   Objective: Vital signs in last 24 hours: Temp:  [97.8 F (36.6 C)-98.4 F (36.9 C)] 98.1 F (36.7 C) (06/27 0600) Pulse Rate:  [61-90] 80 (06/27 0600) Resp:  [17-23] 18 (06/27 0600) BP: (106-143)/(63-81) 106/74 mmHg (06/27 0600) SpO2:  [93 %-100 %] 95 % (06/27 0600) Last BM Date: 07/24/12  Intake/Output from previous day: 06/26 0701 - 06/27 0700 In: 480 [P.O.:360; I.V.:120] Out: 430 [Urine:430] Intake/Output this shift:    Medications Current Facility-Administered Medications  Medication Dose Route Frequency Provider Last Rate Last Dose  . 0.9 %  sodium chloride infusion  250 mL Intravenous PRN Loreli Slot, MD      . acetaminophen (TYLENOL) tablet 1,000 mg  1,000 mg Oral Q6H Erin Barrett, PA-C   1,000 mg at 07/28/12 2130   Or  . acetaminophen (TYLENOL) solution 975 mg  975 mg Per Tube Q6H Erin Barrett, PA-C      . alum & mag hydroxide-simeth (MAALOX/MYLANTA) 200-200-20 MG/5ML suspension 15-30 mL  15-30 mL Oral Q4H PRN Loreli Slot, MD      . aspirin EC tablet 325 mg  325 mg Oral Daily Erin Barrett, PA-C   325 mg at 07/27/12 8657   Or  . aspirin chewable tablet 324 mg  324 mg Per Tube Daily Erin Barrett, PA-C      . atorvastatin (LIPITOR) tablet 80 mg  80 mg Oral q1800 Leeann Must, MD   80 mg at 07/27/12 1724  . bisacodyl (DULCOLAX) EC tablet 10 mg  10 mg Oral Daily Erin Barrett, PA-C   10 mg at 07/26/12 1001   Or  . bisacodyl (DULCOLAX) suppository 10 mg  10 mg Rectal Daily Erin Barrett, PA-C      . docusate sodium (COLACE) capsule 200 mg  200 mg Oral Daily Erin Barrett, PA-C   200 mg at 07/27/12 0936  . feeding supplement (ENSURE COMPLETE) liquid 237 mL  237 mL Oral TID WC Loreli Slot, MD   237 mL at 07/28/12 8469  . furosemide (LASIX) tablet 40 mg  40 mg Oral Daily Loreli Slot, MD   40 mg at 07/27/12 6295  . guaiFENesin-dextromethorphan (ROBITUSSIN DM) 100-10 MG/5ML syrup 15 mL  15 mL Oral Q4H PRN Loreli Slot, MD      . magnesium hydroxide (MILK OF MAGNESIA) suspension 30 mL  30 mL Oral Daily PRN Loreli Slot, MD      . metoprolol tartrate (LOPRESSOR) tablet 25 mg  25 mg Oral BID Loreli Slot, MD   25 mg at 07/27/12 2158   Or  . metoprolol tartrate (LOPRESSOR) 25 mg/10 mL oral suspension 25 mg  25 mg Per Tube BID Loreli Slot, MD      . ondansetron [facility]) injection 4 mg  4 mg Intravenous Q6H PRN Erin Barrett, PA-C   4 mg at 07/27/12 0343  . pantoprazole (PROTONIX) EC tablet 40 mg  40 mg Oral Daily Erin Barrett, PA-C   40 mg at 07/27/12 0937  . sodium chloride 0.9 % injection 3 mL  3 mL Intravenous Q12H Loreli Slot, MD   3 mL at 07/27/12 2159  . sodium chloride 0.9 % injection 3 mL  3 mL Intravenous PRN Loreli Slot, MD      . traMADol Janean Sark) tablet 50-100 mg  50-100 mg Oral  Q6H PRN Loreli Slot, MD   50 mg at 07/28/12 8413    PE: General appearance: alert, cooperative and no distress Lungs: clear to auscultation bilaterally Heart: regular rate and rhythm Extremities: no LEE Pulses: 2+ and symmetric Skin: warm and dry Neurologic: Grossly normal  Lab Results:   Recent Labs  07/26/12 1700 07/26/12 1728 07/27/12 0430 07/28/12 0455  WBC 10.2  --  9.1 9.9  HGB 9.6* 9.9* 9.3* 9.8*  HCT 28.4* 29.0* 27.3* 29.5*  PLT 103*  --  95* 98*   BMET  Recent Labs  07/26/12 0415  07/26/12 1728 07/27/12 0430 07/28/12 0455  NA 141  --  139 136 134*  K 3.9  --  4.0 4.1 5.1  CL 111  --  106 104 103  CO2 22  --   --  24 25  GLUCOSE 127*  --  138* 119* 102*  BUN 10  --  9 10 9   CREATININE 0.72  < > 0.80 0.65 0.58  CALCIUM 8.0*  --   --  8.3* 8.5  < > = values in this interval not displayed. PT/INR  Recent Labs  07/25/12 1900  LABPROT 17.5*  INR 1.48    Assessment/Plan  Active  Problems:   STEMI (ST elevation myocardial infarction), secondary to disease of RCA, with disease of LAD and diag. branch and chronic non dominant LCX occlusion for CABG    Essential hypertension   Tobacco abuse   Hyperlipidemia LDL goal < 70  Plan:  Day 3 S/P CABG x 5. Progressing well. No chest pain. Working with cardiac rehab w/o difficulty. No arrhthymias on telemetry. HR and BP is stable. Continue on BB, ASA, and statin. Will need to initiate ACE-I once BP increases. SBP is in the low 100s. ? Possible discharge on Sunday. Will continue to follow.     LOS: 7 days    Alvester Eads M. Delmer Islam 07/28/2012 9:33 AM

## 2012-07-28 NOTE — Progress Notes (Signed)
Assessed patient's ambulation status.  Patient said she feels better from her earlier dizzy episode. Patient stated that she did not want to complete an evening walk and wanted to rest tonight. Will continue to monitor.

## 2012-07-28 NOTE — Discharge Summary (Signed)
Physician Discharge Summary       [address] [place]       [place] [address]             [phone]    Patient ID: [name] [name] MRN: [id number] DOB/AGE: 64-Jul-1952 64 y.o.  Admit date: 07/21/2012 Discharge date: 07/31/2012  Admission Diagnoses: 1.S/p STEMI 2.Multivessel CAD 3. History of hypertension 4.History of tobacco abuse 5.Possible history of stroke 2/14'  Discharge Diagnoses:  1.S/p STEMI 2.Multivessel CAD 3. History of hypertension 4.History of tobacco abuse 5.Possible history of stroke 2/14' 6.ABL anemia 7.Thrombocytpenia   Procedure (s):  (1.)Cardiac Catheterization done by Dr. Allyson Sabal on 07/21/2012: 1. Left main; 30% distal  2. LAD; 60-70% segmental mid followed by 90% fairly focal just beyond this. The first diagonal branch was moderate in size and had an 80-90% proximal stenosis  3. Left circumflex; is a nondominant vessel is small in caliber. It was totally occluded in its midportion and this appeared to be chronic. Gave off a moderate-sized first marginal branch. There was a 90% AV groove circumflex stenosis in the proximal third just before the takeoff of the marginal branch..  4. Right coronary artery; dominant, postop Stapley calcified with 95% stenosis on the first bend, and 90% stenosis on the distal bend but before the Genu , 70% stenosis at the crux, 95% ostial PDA stenosis.  5.LIMA with the subselectively visualized and widely patent. It was suitable for use during coronary artery bypass grafting if necessary  6. Left ventriculography; RAO left ventriculogram was performed using  25 mL of Visipaque dye at 12 mL/second. The overall LVEF estimated  45-50 % With wall motion abnormalities notable for moderate to severe inferobasal hypokinesia and moderate posterolateral hypokinesia  (2.)Median sternotomy, extracorporeal circulation, coronary  artery bypass grafting x5 (left internal mammary artery to LAD, sequential saphenous vein graft to  obtuse marginal 1, obtuse marginal 2, and posterior descending with Y graft to 1st diagonal), endoscopic vein harvest, left thigh by Dr. Dorris Fetch on 07/25/2012.  History of Presenting Illness: This is a 62 yo woman with a history of hypertension, tobacco abuse and a + family history of CAD(brother died of Mi on his 51s). She began having Cp with exertion 2 days prior to admission. On the day of admission she was having frequent episodes and then the pain became constant. Also mild SOB and nausea. She called EMS and was brought to the ED. She was a code STEMI with ongoing CP on arrival. Medical therapy initiated and taken emergently to cath lab. Cath revealed severe 3 vessel CAD. She had inferior hypokinesis as well as mild MR on LV gram. Her pain resolved and she was started on integrelin.  She denies any CP prior to the recent events. However, she did state that she thinks she had a stroke in February of this year. She did not seek medical attention at the time. She says she had some right UE weakness and loss of coordination and that her speech changed. She says these issues have improved since that time. She does not have any problems ambulating. Potential risks, complications, and benefits were discussed with the patient and she agreed to proceed. Pre operative carotid duplex showed no significant carotid artery stenosis bilaterally.She underwent a CABG x 5 on 07/25/2012.   Brief Hospital Course:  The patient was extubated the morning of post operative day one without difficulty. Shee remained afebrile and hemodynamically stable. Theone Murdoch, a line, chest tubes, and foley were removed early in  the post operative course. Lopressor was started and titrated accordingly. She was volume over loaded and diuresed. She was weaned off the insulin drip. The patient's HGA1C pre op was  5.4. She did have a brief episode of confusion later the evening of post operative day one. Oxy was stopped and Ultram was used.  She had no further confusion. She did have ABL anemia. Her last H and H was 9.8 and 29.5. She did not require a post operative transfusion. She also had thrombocytopenia. Her last platelet count was 98,000.The patient was felt surgically stable for transfer from the ICU to PCTU for further convalescence on 07/27/2012.She continues to progress with cardiac rehab. She did require 2 liters of oxygen via [place], but was able to weaned to  room air. She has been tolerating a diet and has had a bowel movement. She was hypertensive post op and medicines were adjusted accordingly.Epicardial pacing wires and chest tube sutures will be removed prior to discharge. Provided the patient remains afebrile, hemodynamically stable, and pending morning round evaluation, She will be surgically stable for discharge on 07/29/2012.   Latest Vital Signs: Blood pressure 152/78, pulse 80, temperature 98.3 F (36.8 C), temperature source Oral, resp. rate 18, height 5\' 1"  (1.549 m), weight 54.432 kg (120 lb), SpO2 100.00%.  Physical Exam: Cardiovascular: RRR, no murmurs, gallops, or rubs.  Pulmonary: Diminished at bases; no rales, wheezes, or rhonchi.  Abdomen: Soft, non tender, bowel sounds present.  Extremities: Trace bilateral lower extremity edema.  Wounds: Clean and dry. No erythema or signs of infection.   Discharge Condition:Stable  Recent laboratory studies:  Lab Results  Component Value Date   WBC 9.3 07/30/2012   HGB 9.8* 07/30/2012   HCT 28.5* 07/30/2012   MCV 93.1 07/30/2012   PLT 183 07/30/2012   Lab Results  Component Value Date   NA 128* 07/30/2012   K 3.8 07/30/2012   CL 91* 07/30/2012   CO2 29 07/30/2012   CREATININE 0.48* 07/30/2012   GLUCOSE 82 07/30/2012      Diagnostic Studies: Dg Chest 2 View  07/28/2012   *RADIOLOGY REPORT*  Clinical Data: 62 year old female status post cardiac surgery.  ST elevation myocardial infarction.  Hypertension.  CHEST - 2 VIEW  Comparison: 07/27/2012 and earlier.   Findings: Right IJ introducer sheath has been removed.  Epicardial pacer wires remain in place.  Sequelae of CABG.  Stable cardiac size and mediastinal contours.  Small to moderate bilateral pleural effusions with fluid in the fissures and perihilar and basilar atelectasis.  No pulmonary edema or pneumothorax. Stable visualized osseous structures.  IMPRESSION: 1.  Right IJ introducer sheath removed.  Epicardial pacer wires remain. 2.  Small to moderate bilateral pleural effusions with atelectasis.   Original Report Authenticated By: Erskine Speed, M.D.    Discharge Orders   Future Appointments Provider Department Dept Phone   08/22/2012 9:45 AM Loreli Slot, MD [facility] [phone]   Future Orders Complete By Expires     Amb Referral to Cardiac Rehabilitation  As directed        Discharge Medications:   Medication List         aspirin 325 MG EC tablet  Take 1 tablet (325 mg total) by mouth daily.     atorvastatin 80 MG tablet  Commonly known as:  LIPITOR  Take 1 tablet (80 mg total) by mouth daily at 6 PM.     cloNIDine 0.2 MG tablet  Commonly known  as:  CATAPRES  Take 0.2 mg by mouth 2 (two) times daily.     furosemide 40 MG tablet  Commonly known as:  LASIX  Take 1 tablet (40 mg total) by mouth daily. For 5 days then stop.     lisinopril 10 MG tablet  Commonly known as:  PRINIVIL,ZESTRIL  Take 1 tablet (10 mg total) by mouth daily.     metoprolol 50 MG tablet  Commonly known as:  LOPRESSOR  Take 1 tablet (50 mg total) by mouth 2 (two) times daily.     potassium chloride SA 20 MEQ tablet  Commonly known as:  K-DUR,KLOR-CON  Take 1 tablet (20 mEq total) by mouth daily. For 5 days then stop.     traMADol 50 MG tablet  Commonly known as:  ULTRAM  Take 1-2 tablets (50-100 mg total) by mouth every 6 (six) hours as needed (pain).        The patient has been discharged on:   1.Beta Blocker:  Yes [  x ]                               No   [   ]                              If No, reason:  2.Ace Inhibitor/ARB: Yes [ x  ]                                     No  [    ]                                     If No, reason:   3.Statin:   Yes [  x ]                  No  [   ]                  If No, reason:  4.Ecasa:  Yes  [ x  ]                  No   [   ]                  If No, reason:  Follow Up Appointments: Follow-up Information   Follow up with HENDRICKSON,STEVEN C, MD. (PA/LAT CXR to be taken (at [facility] which is in the same building as Dr. Sunday Corn office) on 08/22/2012 at 8:45 am;Appointment with Dr. Dorris Fetch is on 08/22/2012 at 9:45 am)    Contact information:   [address] [place] [address] [phone]       Follow up with [name], MD. (Call for a follow up appointment for 2 weeks)    Contact information:   [address] [place] [address] [phone]       Signed: Doree Fudge MPA-C 07/31/2012, 7:44 AM

## 2012-07-28 NOTE — Progress Notes (Addendum)
      [address]       [place] [address]             [phone]        3 Days Post-Op Procedure(s) (LRB): CORONARY ARTERY BYPASS GRAFTING (CABG) times five with Left  Endoscopic Saphenous Vein Harvest and Left Internal  Mammary  (N/A)  Subjective: Patient does not like food. Passing gas but no bowel movement yet. She has no other complaints  Objective: Vital signs in last 24 hours: Temp:  [97.8 F (36.6 C)-98.6 F (37 C)] 98.1 F (36.7 C) (06/27 0600) Pulse Rate:  [61-102] 80 (06/27 0600) Cardiac Rhythm:  [-] Normal sinus rhythm (06/26 2013) Resp:  [17-23] 18 (06/27 0600) BP: (106-143)/(63-81) 106/74 mmHg (06/27 0600) SpO2:  [93 %-100 %] 95 % (06/27 0600) Arterial Line BP: (126)/(55) 126/55 mmHg (06/26 0800)  Pre op weight 51 kg Current Weight  07/27/12 55 kg (121 lb 4.1 oz)      Intake/Output from previous day: 06/26 0701 - 06/27 0700 In: 480 [P.O.:360; I.V.:120] Out: 430 [Urine:430]   Physical Exam:  Cardiovascular: RRR, no murmurs, gallops, or rubs. Pulmonary: Diminished at bases; no rales, wheezes, or rhonchi. Abdomen: Soft, non tender, bowel sounds present. Extremities: Trace bilateral lower extremity edema. Wounds: Clean and dry.  No erythema or signs of infection.  Lab Results: CBC: Recent Labs  07/27/12 0430 07/28/12 0455  WBC 9.1 9.9  HGB 9.3* 9.8*  HCT 27.3* 29.5*  PLT 95* 98*   BMET:  Recent Labs  07/27/12 0430 07/28/12 0455  NA 136 134*  K 4.1 5.1  CL 104 103  CO2 24 25  GLUCOSE 119* 102*  BUN 10 9  CREATININE 0.65 0.58  CALCIUM 8.3* 8.5    PT/INR:  Lab Results  Component Value Date   INR 1.48 07/25/2012   INR 0.98 07/21/2012   INR 0.96 07/21/2012   ABG:  INR: Will add last result for INR, ABG once components are confirmed Will add last 4 CBG results once components are confirmed  Assessment/Plan:  1. CV - SR. On Lopressor 25 bid 2.  Pulmonary - CXR this am shows bilateral pleural effusions L>R,  bibasilar atelectasis, and no pneumothorax.On 2 liters of oxygen via [place]-wean as tolerates.Encourage incentive spirometer 3. Volume Overload - On lasix 40 daily 4.  Acute blood loss anemia - H and H stable at 9.8 and 29.5 5.Thrombocytopenia-platelets up to 98,000 6.CBGs 119/111/74. Pre op HGA1C 5.4. Stop accu checks and SS PRN 7.Remove EPW in am 8.Potassium up to 5.1. Stop supplementation 9.Possible discharge 1-2 days  ZIMMERMAN,DONIELLE MPA-C 07/28/2012,7:49 AM  Patient seen and examined. Agree with above Ensure supplements Hopefully home over the weekend

## 2012-07-28 NOTE — Progress Notes (Signed)
CARDIAC REHAB PHASE I   PRE:  Rate/Rhythm: 92 SR    BP: sitting 120/80    SaO2: 96 2L (cold fingers)  MODE:  Ambulation: 550 ft   POST:  Rate/Rhythm: 103     BP: sitting 140/80     SaO2: 100 RA  Pt SaO2 difficult to access due to cold fingers. Got hot pack while walking and SaO2 register 97-100 RA during ambulation. Left pt off O2 after walk. No SOB. Pt with some confusion (when asked if she was SOB, she stated "I told you my head hurts!") Used RW, which she sts she would like to order before d/c. Attempted walking without it and pt fatigued quickly, ? Due to hips. Pt needs family for ed. Left in recliner without O2 and notified RN. [phone]   Elissa Lovett [place] CES, ACSM 07/28/2012 10:03 AM

## 2012-07-29 DIAGNOSIS — Z951 Presence of aortocoronary bypass graft: Secondary | ICD-10-CM

## 2012-07-29 LAB — GLUCOSE, CAPILLARY
Glucose-Capillary: 117 mg/dL — ABNORMAL HIGH (ref 70–99)
Glucose-Capillary: 80 mg/dL (ref 70–99)

## 2012-07-29 MED ORDER — LISINOPRIL 2.5 MG PO TABS
2.5000 mg | ORAL_TABLET | Freq: Every day | ORAL | Status: DC
  Administered 2012-07-29: 2.5 mg via ORAL
  Filled 2012-07-29 (×2): qty 1

## 2012-07-29 NOTE — Progress Notes (Addendum)
Patient ID: [name] [name], female   DOB: Jul 18, 1950, 62 y.o.   MRN: [id number]  Subjective: No complaints. She is pleased with her progress.  Doing well ambulating with cardiac rehab with little difficulty.   Objective: Vital signs in last 24 hours: Temp:  [97.7 F (36.5 C)-98.3 F (36.8 C)] 97.7 F (36.5 C) (06/28 0551) Pulse Rate:  [81-91] 81 (06/28 0551) Resp:  [16-18] 18 (06/28 0551) BP: (128-147)/(78-91) 136/83 mmHg (06/28 0551) SpO2:  [88 %-100 %] 100 % (06/28 0551) Weight:  [123 lb 7.3 oz (56 kg)] 123 lb 7.3 oz (56 kg) (06/28 0551) Last BM Date: 07/26/12  Intake/Output from previous day: 06/27 0701 - 06/28 0700 In: 240 [P.O.:240] Out: 500 [Urine:500] Intake/Output this shift:   Medications Current Facility-Administered Medications  Medication Dose Route Frequency Provider Last Rate Last Dose  . 0.9 %  sodium chloride infusion  250 mL Intravenous PRN Loreli Slot, MD      . acetaminophen (TYLENOL) tablet 1,000 mg  1,000 mg Oral Q6H Erin Barrett, PA-C   1,000 mg at 07/29/12 0558   Or  . acetaminophen (TYLENOL) solution 975 mg  975 mg Per Tube Q6H Erin Barrett, PA-C      . alum & mag hydroxide-simeth (MAALOX/MYLANTA) 200-200-20 MG/5ML suspension 15-30 mL  15-30 mL Oral Q4H PRN Loreli Slot, MD      . aspirin EC tablet 325 mg  325 mg Oral Daily Erin Barrett, PA-C   325 mg at 07/28/12 1023   Or  . aspirin chewable tablet 324 mg  324 mg Per Tube Daily Erin Barrett, PA-C      . atorvastatin (LIPITOR) tablet 80 mg  80 mg Oral q1800 Leeann Must, MD   80 mg at 07/28/12 1816  . bisacodyl (DULCOLAX) EC tablet 10 mg  10 mg Oral Daily Erin Barrett, PA-C   10 mg at 07/28/12 1027   Or  . bisacodyl (DULCOLAX) suppository 10 mg  10 mg Rectal Daily Erin Barrett, PA-C      . docusate sodium (COLACE) capsule 200 mg  200 mg Oral Daily Erin Barrett, PA-C   200 mg at 07/28/12 1023  . feeding supplement (ENSURE COMPLETE) liquid 237 mL  237 mL Oral TID WC Loreli Slot,  MD   237 mL at 07/29/12 0700  . furosemide (LASIX) tablet 40 mg  40 mg Oral Daily Loreli Slot, MD   40 mg at 07/28/12 1023  . guaiFENesin-dextromethorphan (ROBITUSSIN DM) 100-10 MG/5ML syrup 15 mL  15 mL Oral Q4H PRN Loreli Slot, MD      . lisinopril (PRINIVIL,ZESTRIL) tablet 2.5 mg  2.5 mg Oral Daily Gina L Collins, PA-C      . magnesium hydroxide (MILK OF MAGNESIA) suspension 30 mL  30 mL Oral Daily PRN Loreli Slot, MD      . metoprolol tartrate (LOPRESSOR) tablet 25 mg  25 mg Oral BID Loreli Slot, MD   25 mg at 07/28/12 2102   Or  . metoprolol tartrate (LOPRESSOR) 25 mg/10 mL oral suspension 25 mg  25 mg Per Tube BID Loreli Slot, MD      . ondansetron [facility]) injection 4 mg  4 mg Intravenous Q6H PRN Erin Barrett, PA-C   4 mg at 07/27/12 0343  . pantoprazole (PROTONIX) EC tablet 40 mg  40 mg Oral Daily Erin Barrett, PA-C   40 mg at 07/28/12 1026  . sodium chloride 0.9 % injection 3 mL  3 mL Intravenous  Q12H Loreli Slot, MD   3 mL at 07/28/12 2103  . sodium chloride 0.9 % injection 3 mL  3 mL Intravenous PRN Loreli Slot, MD      . traMADol Janean Sark) tablet 50-100 mg  50-100 mg Oral Q6H PRN Loreli Slot, MD   100 mg at 07/28/12 2108   PE: General appearance: alert, cooperative and no distress Lungs: clear to auscultation bilaterally, non-labored. Heart: regular rate and rhythm, no M/R/G Extremities: no LEE Pulses: 2+ and symmetric Skin: warm and dry Neurologic: Grossly normal  Lab Results:   Recent Labs  07/26/12 1700 07/26/12 1728 07/27/12 0430 07/28/12 0455  WBC 10.2  --  9.1 9.9  HGB 9.6* 9.9* 9.3* 9.8*  HCT 28.4* 29.0* 27.3* 29.5*  PLT 103*  --  95* 98*   BMET  Recent Labs  07/26/12 1728 07/27/12 0430 07/28/12 0455  NA 139 136 134*  K 4.0 4.1 5.1  CL 106 104 103  CO2  --  24 25  GLUCOSE 138* 119* 102*  BUN 9 10 9   CREATININE 0.80 0.65 0.58  CALCIUM  --  8.3* 8.5   PT/INR No results found for  this basename: LABPROT, INR,  in the last 72 hours  Assessment/Plan  Active Problems:   STEMI (ST elevation myocardial infarction), secondary to disease of RCA, with disease of LAD and diag. branch and chronic non dominant LCX occlusion for CABG    Essential hypertension   Tobacco abuse   Hyperlipidemia LDL goal < 70   S/P CABG x 5  Plan:  Day 4 S/P CABG x 5. Progressing well. No chest pain. Working with cardiac rehab w/o difficulty. No arrhthymias on telemetry. HR and BP is stable. Continue on BB, ASA, and statin. Seems to be able to tolerate ACE-I.   Continued slow diuresis -- still on O2 ? Possible discharge on Sunday. Will continue to follow.   SMOKING CESSATION COUNSELING GIVEN -- ~5 min.   Will need f/y @ [facility] with Dr. Allyson Sabal or APP -- can probably tolerate up-titration of BB +/- ACE-I.   LOS: 8 days   Caiya Bettes W, MD 07/29/2012 9:58 AM

## 2012-07-29 NOTE — Progress Notes (Signed)
Patient ambulated approximately 314ft using walking with RN on room air.  Patient's gait was slow and steady.  Patient tolerated ambulation well.  Patient returned to bed resting. Patient's vital signs were stable. Will continue to monitor.

## 2012-07-29 NOTE — Progress Notes (Signed)
CARDIAC REHAB PHASE I   PRE:  Rate/Rhythm: 95 SR    BP: sitting 160/90    SaO2: 95 RA  MODE:  Ambulation: 550 ft   POST:  Rate/Rhythm: 101 ST    BP: sitting 164/84     SaO2: 98 RA  Tolerated well with RW. No major c/o. SaO2 good on RA. BP elevated, notified RN. To recliner. [phone]   Elissa Lovett [place] CES, ACSM 07/29/2012 12:07 PM

## 2012-07-29 NOTE — Progress Notes (Addendum)
       [address]       [place] [address]             [phone]          4 Days Post-Op Procedure(s) (LRB): CORONARY ARTERY BYPASS GRAFTING (CABG) times five with Left  Endoscopic Saphenous Vein Harvest and Left Internal  Mammary  (N/A)  Subjective: Feels better today than she did yesterday. Ate better. Still no BM.   Objective: Vital signs in last 24 hours: Patient Vitals for the past 24 hrs:  BP Temp Temp src Pulse Resp SpO2 Weight  07/29/12 0551 136/83 mmHg 97.7 F (36.5 C) Oral 81 18 100 % 123 lb 7.3 oz (56 kg)  07/28/12 2117 147/91 mmHg 97.9 F (36.6 C) Oral 88 18 100 % -  07/28/12 1530 144/86 mmHg 98.3 F (36.8 C) Oral 84 18 94 % -  07/28/12 1433 - - - - 16 88 % -  07/28/12 1330 140/88 mmHg 97.8 F (36.6 C) Oral 91 18 95 % -  07/28/12 1023 128/78 mmHg - - - - - -   Current Weight  07/29/12 123 lb 7.3 oz (56 kg)     Intake/Output from previous day: 06/27 0701 - 06/28 0700 In: 240 [P.O.:240] Out: 500 [Urine:500]    PHYSICAL EXAM:  Heart: RRR Lungs: Clear Wound: Clean and dry Extremities: No significant LE edema    Lab Results: CBC: Recent Labs  07/27/12 0430 07/28/12 0455  WBC 9.1 9.9  HGB 9.3* 9.8*  HCT 27.3* 29.5*  PLT 95* 98*   BMET:  Recent Labs  07/27/12 0430 07/28/12 0455  NA 136 134*  K 4.1 5.1  CL 104 103  CO2 24 25  GLUCOSE 119* 102*  BUN 10 9  CREATININE 0.65 0.58  CALCIUM 8.3* 8.5    PT/INR: No results found for this basename: LABPROT, INR,  in the last 72 hours    Assessment/Plan: S/P Procedure(s) (LRB): CORONARY ARTERY BYPASS GRAFTING (CABG) times five with Left  Endoscopic Saphenous Vein Harvest and Left Internal  Mammary  (N/A)  CV- HR stable, BPs trending up. Will start low dose ACE-I, continue beta blocker.  Vol overload- continue diuresis.  Thrombocytopenia- plts stable. Recheck labs in am.  D/c wires, CT sutures.  Hopefully home in am if stable and off O2.   LOS: 8 days     COLLINS,GINA H 07/29/2012  I have seen and examined [name] [name] and agree with the above assessment  and plan.  Delight Ovens MD Beeper [phone] Office [phone] 07/29/2012 10:26 AM

## 2012-07-30 ENCOUNTER — Encounter (HOSPITAL_COMMUNITY): Payer: Self-pay | Admitting: Cardiology

## 2012-07-30 LAB — CBC
HCT: 28.5 % — ABNORMAL LOW (ref 36.0–46.0)
Hemoglobin: 9.8 g/dL — ABNORMAL LOW (ref 12.0–15.0)
MCV: 93.1 fL (ref 78.0–100.0)
RBC: 3.06 MIL/uL — ABNORMAL LOW (ref 3.87–5.11)
RDW: 13.8 % (ref 11.5–15.5)
WBC: 9.3 10*3/uL (ref 4.0–10.5)

## 2012-07-30 LAB — BASIC METABOLIC PANEL
CO2: 29 mEq/L (ref 19–32)
Chloride: 91 mEq/L — ABNORMAL LOW (ref 96–112)
GFR calc Af Amer: >90 mL/min (ref >90)
Potassium: 3.8 mEq/L (ref 3.5–5.1)
Sodium: 128 mEq/L — ABNORMAL LOW (ref 135–145)

## 2012-07-30 MED ORDER — LISINOPRIL 10 MG PO TABS
10.0000 mg | ORAL_TABLET | Freq: Every day | ORAL | Status: DC
  Administered 2012-07-31: 10 mg via ORAL
  Filled 2012-07-30: qty 1

## 2012-07-30 MED ORDER — METOPROLOL TARTRATE 50 MG PO TABS
50.0000 mg | ORAL_TABLET | Freq: Two times a day (BID) | ORAL | Status: DC
  Administered 2012-07-30 – 2012-07-31 (×3): 50 mg via ORAL
  Filled 2012-07-30 (×4): qty 1

## 2012-07-30 MED ORDER — LISINOPRIL 5 MG PO TABS
5.0000 mg | ORAL_TABLET | Freq: Every day | ORAL | Status: DC
  Administered 2012-07-30: 5 mg via ORAL
  Filled 2012-07-30: qty 1

## 2012-07-30 NOTE — Progress Notes (Signed)
Pt. Complaining of abdominal distention.  Bowel sounds hypoactive.  Nauseated and vomited small amt semi-digested material.  Also incontinent of large amt of formed, then loose stool.  Stated had much relief post defecation.

## 2012-07-30 NOTE — Progress Notes (Signed)
Subjective: No complaints, up in  Chair hopes to go home today.  Objective: Vital signs in last 24 hours: Temp:  [98.2 F (36.8 C)-98.5 F (36.9 C)] 98.4 F (36.9 C) (06/29 0429) Pulse Rate:  [79-85] 79 (06/29 0429) Resp:  [17-19] 19 (06/29 0429) BP: (114-177)/(74-106) 140/80 mmHg (06/29 0840) SpO2:  [94 %-100 %] 94 % (06/29 0429) Weight:  [121 lb 14.4 oz (55.293 kg)] 121 lb 14.4 oz (55.293 kg) (06/29 0429) Weight change: -1 lb 8.9 oz (-0.706 kg) Last BM Date: 07/29/12 Intake/Output from previous day: -1800 (-3226 total) wt 121.14 pre-op wt 111.12  06/28 0701 - 06/29 0700 In: -  Out: 1800 [Urine:1800] Intake/Output this shift: Total I/O In: 240 [P.O.:240] Out: -   PE: General:Pleasant affect, NAD Heart:S1S2 RRR without murmur, gallup, rub or click Lungs:clear without rales, rhonchi, or wheezes NWG:NFAO, non tender, + BS, do not palpate liver spleen or masses Ext:no lower ext edema, 2+ pedal pulses, 2+ radial pulses Neuro:alert and oriented, MAE, follows commands, + facial symmetry  tele:  SR Lab Results:  Recent Labs  07/28/12 0455 07/30/12 0324  WBC 9.9 9.3  HGB 9.8* 9.8*  HCT 29.5* 28.5*  PLT 98* 183   BMET  Recent Labs  07/28/12 0455 07/30/12 0324  NA 134* 128*  K 5.1 3.8  CL 103 91*  CO2 25 29  GLUCOSE 102* 82  BUN 9 10  CREATININE 0.58 0.48*  CALCIUM 8.5 8.7   No results found for this basename: TROPONINI, CK, MB,  in the last 72 hours  Lab Results  Component Value Date   CHOL 187 07/22/2012   HDL 50 07/22/2012   LDLCALC 120* 07/22/2012   TRIG 87 07/22/2012   CHOLHDL 3.7 07/22/2012   Lab Results  Component Value Date   HGBA1C 5.4 07/21/2012     Lab Results  Component Value Date   TSH 0.570 07/21/2012       Studies/Results: No results found.  Medications: I have reviewed the patient's current medications. Scheduled Meds: . acetaminophen  1,000 mg Oral Q6H   Or  . acetaminophen (TYLENOL) oral liquid 160 mg/5 mL  975 mg Per  Tube Q6H  . aspirin EC  325 mg Oral Daily   Or  . aspirin  324 mg Per Tube Daily  . atorvastatin  80 mg Oral q1800  . bisacodyl  10 mg Oral Daily   Or  . bisacodyl  10 mg Rectal Daily  . docusate sodium  200 mg Oral Daily  . feeding supplement  237 mL Oral TID WC  . furosemide  40 mg Oral Daily  . lisinopril  5 mg Oral Daily  . metoprolol tartrate  50 mg Oral BID  . pantoprazole  40 mg Oral Daily  . sodium chloride  3 mL Intravenous Q12H   Continuous Infusions:  PRN Meds:.sodium chloride, alum & mag hydroxide-simeth, guaiFENesin-dextromethorphan, magnesium hydroxide, ondansetron (ZOFRAN) IV, sodium chloride, traMADol  Assessment/Plan: Active Problems:   STEMI (ST elevation myocardial infarction), secondary to disease of RCA, with disease of LAD and diag. branch and chronic non dominant LCX occlusion for CABG    Essential hypertension   Tobacco abuse   Hyperlipidemia LDL goal < 70   S/P CABG x 5 : LIMA-LAD, SVG-OM1-OM2, SVG-(Y)-DIAG-PDA. 07/25/12  PLAN: hyponatremic, BP elevated 154/93, yesterday was 177/106 on lasix 40 lisinopril 5 lopressor 50 BID increased today.  Possible discharge today.  Our office will call her tomorrow with appt date and time.  LOS: 9 days   Time spent with pt. :15 minutes. [facility]  Nurse Practitioner Certified Pager [phone] 07/30/2012, 9:02 AM

## 2012-07-30 NOTE — Progress Notes (Addendum)
       [address]       [place] [address]             [phone]          5 Days Post-Op Procedure(s) (LRB): CORONARY ARTERY BYPASS GRAFTING (CABG) times five with Left  Endoscopic Saphenous Vein Harvest and Left Internal  Mammary  (N/A)  Subjective: Feels well, no complaints.  Off O2 and breathing stable.   Objective: Vital signs in last 24 hours: Patient Vitals for the past 24 hrs:  BP Temp Temp src Pulse Resp SpO2 Weight  07/30/12 0429 154/93 mmHg 98.4 F (36.9 C) Oral 79 19 94 % 121 lb 14.4 oz (55.293 kg)  07/29/12 2204 114/74 mmHg - - - - - -  07/29/12 2028 177/106 mmHg - - - - - -  07/29/12 2024 173/104 mmHg 98.5 F (36.9 C) Oral 85 17 97 % -  07/29/12 1437 145/90 mmHg 98.2 F (36.8 C) Oral 80 18 100 % -   Current Weight  07/30/12 121 lb 14.4 oz (55.293 kg)     Intake/Output from previous day: 06/28 0701 - 06/29 0700 In: -  Out: 1800 [Urine:1800]    PHYSICAL EXAM:  Heart: RRR Lungs: Clear Wound: Clean and dry Extremities: No significant LE edema    Lab Results: CBC: Recent Labs  07/28/12 0455 07/30/12 0324  WBC 9.9 9.3  HGB 9.8* 9.8*  HCT 29.5* 28.5*  PLT 98* 183   BMET:  Recent Labs  07/28/12 0455 07/30/12 0324  NA 134* 128*  K 5.1 3.8  CL 103 91*  CO2 25 29  GLUCOSE 102* 82  BUN 9 10  CREATININE 0.58 0.48*  CALCIUM 8.5 8.7    PT/INR: No results found for this basename: LABPROT, INR,  in the last 72 hours    Assessment/Plan: S/P Procedure(s) (LRB): CORONARY ARTERY BYPASS GRAFTING (CABG) times five with Left  Endoscopic Saphenous Vein Harvest and Left Internal  Mammary  (N/A)  CV- remains hypertensive.  Continue Lopressor, lisinopril - will increase doses and monitor.  Vol overload- diurese.  Thrombocytopenia- plts improved.  Home when BPs better controlled.   LOS: 9 days    COLLINS,GINA H 07/30/2012  BP still elevated, ace increased  Poss home in am if bp better controlled I have seen and  examined Roselyn Bering and agree with the above assessment  and plan.  Delight Ovens MD Beeper [phone] Office [phone] 07/30/2012 10:08 AM

## 2012-07-30 NOTE — Progress Notes (Signed)
Pt. Seen and examined. Agree with the NP/PA-C note as written.  On appropriate medications for discharge. Could consider increased lisinopril to 10 mg daily on discharge for persistently elevated BP. May wish to hold lasix today due to low Na and Cl.  We will arrange follow-up in our office.  Chrystie Nose, MD, [facility] Attending Cardiologist [facility]

## 2012-07-31 MED ORDER — METOPROLOL TARTRATE 50 MG PO TABS: 50.0000 mg | ORAL_TABLET | Freq: Two times a day (BID) | ORAL | Status: DC

## 2012-07-31 MED ORDER — FUROSEMIDE 40 MG PO TABS: 40.0000 mg | ORAL_TABLET | Freq: Every day | ORAL | Status: DC

## 2012-07-31 MED ORDER — POTASSIUM CHLORIDE CRYS ER 20 MEQ PO TBCR: 20.0000 meq | EXTENDED_RELEASE_TABLET | Freq: Every day | ORAL | Status: DC

## 2012-07-31 MED ORDER — TRAMADOL HCL 50 MG PO TABS: 50.0000 mg | ORAL_TABLET | Freq: Four times a day (QID) | ORAL | Status: DC | PRN

## 2012-07-31 MED ORDER — ATORVASTATIN CALCIUM 80 MG PO TABS: 80.0000 mg | ORAL_TABLET | Freq: Every day | ORAL | Status: DC

## 2012-07-31 MED ORDER — ASPIRIN 325 MG PO TBEC: 325.0000 mg | DELAYED_RELEASE_TABLET | Freq: Every day | ORAL | Status: DC

## 2012-07-31 MED ORDER — LISINOPRIL 10 MG PO TABS: 10.0000 mg | ORAL_TABLET | Freq: Every day | ORAL | Status: DC

## 2012-07-31 MED ORDER — POTASSIUM CHLORIDE CRYS ER 20 MEQ PO TBCR
20.0000 meq | EXTENDED_RELEASE_TABLET | Freq: Every day | ORAL | Status: DC
  Administered 2012-07-31: 20 meq via ORAL
  Filled 2012-07-31: qty 1

## 2012-07-31 MED ORDER — CLONIDINE HCL 0.2 MG PO TABS
0.2000 mg | ORAL_TABLET | Freq: Two times a day (BID) | ORAL | Status: DC
  Administered 2012-07-31: 0.2 mg via ORAL
  Filled 2012-07-31 (×2): qty 1

## 2012-07-31 NOTE — Progress Notes (Signed)
Subjective:  No CP/SOB  Objective:  Temp:  [97.7 F (36.5 C)-98.3 F (36.8 C)] 98.3 F (36.8 C) (06/30 0356) Pulse Rate:  [67-88] 80 (06/30 0356) Resp:  [18] 18 (06/30 0356) BP: (127-171)/(78-86) 152/78 mmHg (06/30 0356) SpO2:  [100 %] 100 % (06/30 0356) Weight:  [120 lb (54.432 kg)] 120 lb (54.432 kg) (06/30 0356) Weight change: -1 lb 14.4 oz (-0.862 kg)  Intake/Output from previous day: 06/29 0701 - 06/30 0700 In: 240 [P.O.:240] Out: 200 [Urine:200]  Intake/Output from this shift:    Physical Exam: General appearance: alert and no distress Neck: no adenopathy, no carotid bruit, no JVD, supple, symmetrical, trachea midline and thyroid not enlarged, symmetric, no tenderness/mass/nodules Lungs: clear to auscultation bilaterally Heart: regular rate and rhythm, S1, S2 normal, no murmur, click, rub or gallop Extremities: extremities normal, atraumatic, no cyanosis or edema  Lab Results: Results for orders placed during the hospital encounter of 07/21/12 (from the past 48 hour(s))  GLUCOSE, CAPILLARY     Status: Abnormal   Collection Time    07/29/12 11:23 AM      Result Value Range   Glucose-Capillary 117 (*) 70 - 99 mg/dL  GLUCOSE, CAPILLARY     Status: None   Collection Time    07/29/12  4:30 PM      Result Value Range   Glucose-Capillary 80  70 - 99 mg/dL  BASIC METABOLIC PANEL     Status: Abnormal   Collection Time    07/30/12  3:24 AM      Result Value Range   Sodium 128 (*) 135 - 145 mEq/L   Potassium 3.8  3.5 - 5.1 mEq/L   Comment: DELTA CHECK NOTED   Chloride 91 (*) 96 - 112 mEq/L   CO2 29  19 - 32 mEq/L   Glucose, Bld 82  70 - 99 mg/dL   BUN 10  6 - 23 mg/dL   Creatinine, Ser 9.56 (*) 0.50 - 1.10 mg/dL   Calcium 8.7  8.4 - 21.3 mg/dL   GFR calc non Af Amer >90  >90 mL/min   GFR calc Af Amer >90  >90 mL/min   Comment:            The eGFR has been calculated     using the CKD EPI equation.     This calculation has not been     validated in all  clinical     situations.     eGFR's persistently     <90 mL/min signify     possible Chronic Kidney Disease.  CBC     Status: Abnormal   Collection Time    07/30/12  3:24 AM      Result Value Range   WBC 9.3  4.0 - 10.5 K/uL   RBC 3.06 (*) 3.87 - 5.11 MIL/uL   Hemoglobin 9.8 (*) 12.0 - 15.0 g/dL   HCT 08.6 (*) 57.8 - 46.9 %   MCV 93.1  78.0 - 100.0 fL   MCH 32.0  26.0 - 34.0 pg   MCHC 34.4  30.0 - 36.0 g/dL   RDW 62.9  52.8 - 41.3 %   Platelets 183  150 - 400 K/uL   Comment: DELTA CHECK NOTED    Imaging: Imaging results have been reviewed  Assessment/Plan:   1. Active Problems: 2.   STEMI (ST elevation myocardial infarction), secondary to disease of RCA, with disease of LAD and diag. branch and chronic non dominant LCX occlusion for CABG  3.   Essential hypertension 4.   Tobacco abuse 5.   Hyperlipidemia LDL goal < 70 6.   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 07/25/12 7.   Time Spent Directly with Patient:  20 minutes  Length of Stay:  LOS: 10 days   POD # 6 CABG after an aborted STEMI. Looks great. NSR. VSS. No CP/SOB. Labs ok. D/C home today. ROV with a MLP in our office 2-3 weeks and with me in 3 months. CR[facility].   [name] 07/31/2012, 9:16 AM

## 2012-07-31 NOTE — Progress Notes (Addendum)
      [address]       [place] [address]             [phone]        6 Days Post-Op Procedure(s) (LRB): CORONARY ARTERY BYPASS GRAFTING (CABG) times five with Left  Endoscopic Saphenous Vein Harvest and Left Internal  Mammary  (N/A)  Subjective: Patient had abdominal distention, nausea, and small amount of emesis yesterday. Patient eating breakfast and feels much better this am.  Objective: Vital signs in last 24 hours: Temp:  [97.7 F (36.5 C)-98.3 F (36.8 C)] 98.3 F (36.8 C) (06/30 0356) Pulse Rate:  [67-88] 80 (06/30 0356) Cardiac Rhythm:  [-] Normal sinus rhythm (06/29 2100) Resp:  [18] 18 (06/30 0356) BP: (127-171)/(78-86) 152/78 mmHg (06/30 0356) SpO2:  [100 %] 100 % (06/30 0356) Weight:  [54.432 kg (120 lb)] 54.432 kg (120 lb) (06/30 0356)  Pre op weight 51 kg Current Weight  07/31/12 54.432 kg (120 lb)      Intake/Output from previous day: 06/29 0701 - 06/30 0700 In: 240 [P.O.:240] Out: 200 [Urine:200]   Physical Exam:  Cardiovascular: RRR, no murmurs, gallops, or rubs. Pulmonary: Diminished at bases; no rales, wheezes, or rhonchi. Abdomen: Soft, non tender, bowel sounds present. Extremities: Trace bilateral lower extremity edema. Wounds: Clean and dry.  No erythema or signs of infection.  Lab Results: CBC:  Recent Labs  07/30/12 0324  WBC 9.3  HGB 9.8*  HCT 28.5*  PLT 183   BMET:   Recent Labs  07/30/12 0324  NA 128*  K 3.8  CL 91*  CO2 29  GLUCOSE 82  BUN 10  CREATININE 0.48*  CALCIUM 8.7    PT/INR:  Lab Results  Component Value Date   INR 1.48 07/25/2012   INR 0.98 07/21/2012   INR 0.96 07/21/2012   ABG:  INR: Will add last result for INR, ABG once components are confirmed Will add last 4 CBG results once components are confirmed  Assessment/Plan:  1. CV - SR. On Lopressor 25 bid, Lisinopril 10 daily. Will restart Catapres for better bp control. 2.  Pulmonary - CXR this am shows bilateral  pleural effusions L>R, bibasilar atelectasis, and no pneumothorax.On 2 liters of oxygen via [place]-wean as tolerates.Encourage incentive spirometer 3. Volume Overload - On lasix 40 daily 4.  Acute blood loss anemia - H and H stable at 9.8 and 28.5 5.Thrombocytopenia-platelets up to 98,000 6.CBGs 93/117/80. Pre op HGA1C 5.4. Stop accu checks and SS PRN 7.Discharge today  ZIMMERMAN,DONIELLE MPA-C 07/31/2012,7:29 AM  I have seen and examined the patient and agree with the assessment and plan as outlined.  Kavitha Lansdale H 07/31/2012 8:57 AM

## 2012-07-31 NOTE — Progress Notes (Signed)
Patient did not feel like walking last night. Will continue to monitor.

## 2012-08-13 ENCOUNTER — Inpatient Hospital Stay (HOSPITAL_COMMUNITY)
Admission: EM | Admit: 2012-08-13 | Discharge: 2012-08-16 | DRG: 040 | Disposition: A | Discharge status: Rehabilitation Facility | Payer: Medicaid Other | Attending: Internal Medicine | Admitting: Internal Medicine

## 2012-08-13 ENCOUNTER — Emergency Department (HOSPITAL_COMMUNITY): Payer: Medicaid Other

## 2012-08-13 ENCOUNTER — Encounter (HOSPITAL_COMMUNITY): Payer: Self-pay | Admitting: *Deleted

## 2012-08-13 DIAGNOSIS — Z720 Tobacco use: Secondary | ICD-10-CM | POA: Diagnosis present

## 2012-08-13 DIAGNOSIS — Z8249 Family history of ischemic heart disease and other diseases of the circulatory system: Secondary | ICD-10-CM

## 2012-08-13 DIAGNOSIS — I10 Essential (primary) hypertension: Secondary | ICD-10-CM

## 2012-08-13 DIAGNOSIS — I2510 Atherosclerotic heart disease of native coronary artery without angina pectoris: Secondary | ICD-10-CM | POA: Diagnosis present

## 2012-08-13 DIAGNOSIS — K5900 Constipation, unspecified: Secondary | ICD-10-CM | POA: Diagnosis present

## 2012-08-13 DIAGNOSIS — Z823 Family history of stroke: Secondary | ICD-10-CM

## 2012-08-13 DIAGNOSIS — R4701 Aphasia: Secondary | ICD-10-CM | POA: Diagnosis present

## 2012-08-13 DIAGNOSIS — Z79899 Other long term (current) drug therapy: Secondary | ICD-10-CM

## 2012-08-13 DIAGNOSIS — Z7982 Long term (current) use of aspirin: Secondary | ICD-10-CM

## 2012-08-13 DIAGNOSIS — E785 Hyperlipidemia, unspecified: Secondary | ICD-10-CM

## 2012-08-13 DIAGNOSIS — I63512 Cerebral infarction due to unspecified occlusion or stenosis of left middle cerebral artery: Secondary | ICD-10-CM | POA: Diagnosis present

## 2012-08-13 DIAGNOSIS — I2119 ST elevation (STEMI) myocardial infarction involving other coronary artery of inferior wall: Secondary | ICD-10-CM | POA: Diagnosis present

## 2012-08-13 DIAGNOSIS — E876 Hypokalemia: Secondary | ICD-10-CM | POA: Diagnosis not present

## 2012-08-13 DIAGNOSIS — I6340 Cerebral infarction due to embolism of unspecified cerebral artery: Principal | ICD-10-CM | POA: Diagnosis present

## 2012-08-13 DIAGNOSIS — G9340 Encephalopathy, unspecified: Secondary | ICD-10-CM | POA: Diagnosis present

## 2012-08-13 DIAGNOSIS — R29898 Other symptoms and signs involving the musculoskeletal system: Secondary | ICD-10-CM | POA: Diagnosis present

## 2012-08-13 DIAGNOSIS — F17200 Nicotine dependence, unspecified, uncomplicated: Secondary | ICD-10-CM

## 2012-08-13 DIAGNOSIS — I639 Cerebral infarction, unspecified: Secondary | ICD-10-CM

## 2012-08-13 DIAGNOSIS — Z951 Presence of aortocoronary bypass graft: Secondary | ICD-10-CM

## 2012-08-13 DIAGNOSIS — I6350 Cerebral infarction due to unspecified occlusion or stenosis of unspecified cerebral artery: Secondary | ICD-10-CM

## 2012-08-13 DIAGNOSIS — H53469 Homonymous bilateral field defects, unspecified side: Secondary | ICD-10-CM | POA: Diagnosis present

## 2012-08-13 HISTORY — DX: Cerebral infarction, unspecified: I63.9

## 2012-08-13 LAB — DIFFERENTIAL
Basophils Absolute: 0.1 10*3/uL (ref 0.0–0.1)
Basophils Relative: 1 % (ref 0–1)
Lymphocytes Relative: 13 % (ref 12–46)
Monocytes Relative: 8 % (ref 3–12)
Neutro Abs: 6.8 10*3/uL (ref 1.7–7.7)
Neutrophils Relative %: 78 % — ABNORMAL HIGH (ref 43–77)

## 2012-08-13 LAB — POCT I-STAT TROPONIN I: Troponin i, poc: 0.03 ng/mL (ref 0.00–0.08)

## 2012-08-13 LAB — CBC
HCT: 34.1 % — ABNORMAL LOW (ref 36.0–46.0)
Hemoglobin: 11.5 g/dL — ABNORMAL LOW (ref 12.0–15.0)
MCHC: 33.7 g/dL (ref 30.0–36.0)
WBC: 8.8 10*3/uL (ref 4.0–10.5)

## 2012-08-13 LAB — TSH: TSH: 0.57 u[IU]/mL (ref 0.350–4.500)

## 2012-08-13 LAB — COMPREHENSIVE METABOLIC PANEL
ALT: 23 U/L (ref 0–35)
AST: 19 U/L (ref 0–37)
Albumin: 3.3 g/dL — ABNORMAL LOW (ref 3.5–5.2)
Alkaline Phosphatase: 77 U/L (ref 39–117)
Calcium: 9.9 mg/dL (ref 8.4–10.5)
GFR calc Af Amer: >90 mL/min (ref >90)
Potassium: 3.6 mEq/L (ref 3.5–5.1)
Sodium: 136 mEq/L (ref 135–145)
Total Protein: 7.5 g/dL (ref 6.0–8.3)

## 2012-08-13 LAB — PROTIME-INR: INR: 1.1 (ref 0.00–1.49)

## 2012-08-13 LAB — APTT: aPTT: 26 seconds (ref 24–37)

## 2012-08-13 LAB — HEMOGLOBIN A1C: Mean Plasma Glucose: 108 mg/dL (ref <117)

## 2012-08-13 MED ORDER — SODIUM CHLORIDE 0.9 % IV SOLN
INTRAVENOUS | Status: DC
  Administered 2012-08-13: 1000 mL via INTRAVENOUS
  Administered 2012-08-16: 06:00:00 via INTRAVENOUS

## 2012-08-13 MED ORDER — HEPARIN SODIUM (PORCINE) 5000 UNIT/ML IJ SOLN
5000.0000 [IU] | Freq: Three times a day (TID) | INTRAMUSCULAR | Status: DC
  Administered 2012-08-13 – 2012-08-16 (×8): 5000 [IU] via SUBCUTANEOUS
  Filled 2012-08-13 (×11): qty 1

## 2012-08-13 MED ORDER — METOPROLOL TARTRATE 50 MG PO TABS
50.0000 mg | ORAL_TABLET | Freq: Two times a day (BID) | ORAL | Status: DC
  Administered 2012-08-13 – 2012-08-16 (×6): 50 mg via ORAL
  Filled 2012-08-13 (×7): qty 1

## 2012-08-13 MED ORDER — ASPIRIN 325 MG PO TABS
325.0000 mg | ORAL_TABLET | Freq: Every day | ORAL | Status: DC
  Administered 2012-08-13 – 2012-08-16 (×4): 325 mg via ORAL
  Filled 2012-08-13 (×4): qty 1

## 2012-08-13 MED ORDER — ASPIRIN 300 MG RE SUPP
300.0000 mg | Freq: Every day | RECTAL | Status: DC
  Filled 2012-08-13 (×4): qty 1

## 2012-08-13 MED ORDER — CLONIDINE HCL 0.2 MG PO TABS
0.2000 mg | ORAL_TABLET | Freq: Two times a day (BID) | ORAL | Status: DC
  Administered 2012-08-13 – 2012-08-16 (×6): 0.2 mg via ORAL
  Filled 2012-08-13 (×7): qty 1

## 2012-08-13 MED ORDER — ATORVASTATIN CALCIUM 80 MG PO TABS
80.0000 mg | ORAL_TABLET | Freq: Every day | ORAL | Status: DC
  Administered 2012-08-14 – 2012-08-15 (×2): 80 mg via ORAL
  Filled 2012-08-13 (×3): qty 1

## 2012-08-13 MED ORDER — SENNOSIDES-DOCUSATE SODIUM 8.6-50 MG PO TABS: 1.0000 | ORAL_TABLET | Freq: Every evening | ORAL | Status: DC | PRN

## 2012-08-13 MED ORDER — LISINOPRIL 10 MG PO TABS
10.0000 mg | ORAL_TABLET | Freq: Every day | ORAL | Status: DC
  Administered 2012-08-13 – 2012-08-15 (×3): 10 mg via ORAL
  Filled 2012-08-13 (×3): qty 1

## 2012-08-13 NOTE — ED Notes (Addendum)
Pt had wrong birthdate on armband due to pt confusion. Family verified birth date and registration updated.

## 2012-08-13 NOTE — Consult Note (Signed)
Referring Physician: Dr Lynelle Doctor    Chief Complaint: Confusion  HPI: [name] [name] is a 62 y.o. female who has been staying with her sister recently while [name] [name] recovers from coronary artery bypass graft surgery. She apparently awoke this morning at approximately 4 AM and was noted to be confused with rambling speech. She was brought to the emergency department at [facility] today by family members for further evaluation.The patient was noted to be having difficulty moving her right side.  A CT scan of the head is currently pending; although, the patient is outside the window for TPA. The fact that she recently had coronary artery bypass graft surgery also excludes her from TPA consideration. The patient was taking aspirin 325 mg daily prior to admission. This will be continued. She will be admitted for further evaluation and treatment. Her NIH score was felt to be a 7.  Her modified Rankin score was believed to be a 1.  Date last known well: 08/12/12 Time last known well: unknown tPA Given: no t-PA was given secondary to late presentation and recent coronary artery bypass graft surgery.  Past Medical History  Diagnosis Date  . Hypertension   . Hyperlipidemia LDL goal < 70 07/24/2012  . STEMI (ST elevation myocardial infarction), secondary to disease of RCA, with disease of LAD and diag. branch and chronic non dominant LCX occlusion for CABG  07/21/2012  . 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 07/25/12 07/28/2012    POSTOPERATIVE DIAGNOSIS: Severe 3-vessel coronary disease, status post  myocardial infarction.  PROCEDURE: Median sternotomy, extracorporeal circulation, coronary  artery bypass grafting x5 (left internal mammary artery to LAD,  sequential saphenous vein graft to obtuse marginal 1, obtuse marginal 2,  and posterior descending with Y graft to 1st diagonal), endoscopic vein  harvest, left thigh.    . Tobacco abuse 07/21/2012    Past Surgical History  Procedure  Laterality Date  . Coronary artery bypass graft N/A 07/25/2012    Procedure: CORONARY ARTERY BYPASS GRAFTING (CABG) times five with Left  Endoscopic Saphenous Vein Harvest and Left Internal  Mammary ;  Surgeon: Loreli Slot, MD;  Location: [facility] OR;  Service: Open Heart Surgery;  Laterality: N/A;    Family history: significant for strokes and MIs   Social History:  reports that she has been smoking Cigarettes.  She has been smoking about 0.25 packs per day. She does not have any smokeless tobacco history on file. She reports that she does not drink alcohol or use illicit drugs.  Allergies: No Known Allergies  Medications:  No current facility-administered medications for this encounter.   Current Outpatient Prescriptions  Medication Sig Dispense Refill  . aspirin EC 325 MG tablet Take 325 mg by mouth daily.      Marland Kitchen atorvastatin (LIPITOR) 80 MG tablet Take 80 mg by mouth daily.      . cloNIDine (CATAPRES) 0.2 MG tablet Take 0.2 mg by mouth 2 (two) times daily.      Marland Kitchen lisinopril (PRINIVIL,ZESTRIL) 10 MG tablet Take 10 mg by mouth daily.      . metoprolol (LOPRESSOR) 50 MG tablet Take 50 mg by mouth 2 (two) times daily.      . traMADol (ULTRAM) 50 MG tablet Take 50-100 mg by mouth every 6 (six) hours as needed for pain.        ROS: Unobtainable secondary to confusion. The patient's family was not aware of any recent medical issues other than her recent surgery.  Physical  Examination: Blood pressure 146/88, pulse 59, temperature 98.4 F (36.9 C), temperature source Oral, resp. rate 21, SpO2 100.00%.  Neurologic Examination: Mental Status: The patient is alert but pleasantly confused. She has word finding difficulties as well as naming problems.  Able to follow some 2 step commands. Occasionally has difficulty with commands. Cranial Nerves: II: Discs flat bilaterally; Visual fields reveal a right hemianopia, pupils equal, round, reactive to light and accommodation III,IV, VI:  ptosis not present, extra-ocular motions intact bilaterally V,VII: smile symmetric, facial light touch sensation normal bilaterally VIII: hearing normal bilaterally IX,X: gag reflex present XI: bilateral shoulder shrug XII: midline tongue extension Motor: Right : Upper extremity   5/5 to confrontation, but difficulty with coordinating movements.     Left:     Upper extremity   5/5  Lower extremity   5/5     Lower extremity   5/5 Tone and bulk:normal tone throughout; no atrophy noted Sensory: decreased sensation to light touch in the right upper extremity and right face. Deep Tendon Reflexes: 2+ and symmetric throughout Plantars: Right: downgoing   Left: downgoing Cerebellar: Coordination problems noted with the right upper extremity. She is able to perform finger to nose testing. Gait: not tested CV: pulses palpable throughout   Laboratory Studies:  Basic Metabolic Panel:  Recent Labs Lab 08/13/12 1620  NA 136  K 3.6  CL 96  CO2 25  GLUCOSE 105*  BUN 10  CREATININE 0.63  CALCIUM 9.9    Liver Function Tests:  Recent Labs Lab 08/13/12 1620  AST 19  ALT 23  ALKPHOS 77  BILITOT 0.6  PROT 7.5  ALBUMIN 3.3*   No results found for this basename: LIPASE, AMYLASE,  in the last 168 hours No results found for this basename: AMMONIA,  in the last 168 hours  CBC:  Recent Labs Lab 08/13/12 1620  WBC 8.8  NEUTROABS 6.8  HGB 11.5*  HCT 34.1*  MCV 93.2  PLT 427*    Cardiac Enzymes:  Recent Labs Lab 08/13/12 1620  TROPONINI <0.30    BNP: No components found with this basename: POCBNP,   CBG:  Recent Labs Lab 08/13/12 1525  GLUCAP 97    Microbiology: Results for orders placed during the hospital encounter of 07/21/12  MRSA PCR SCREENING     Status: None   Collection Time    07/21/12 10:20 PM      Result Value Range Status   MRSA by PCR NEGATIVE  NEGATIVE Final   Comment:            The GeneXpert MRSA Assay (FDA     approved for NASAL specimens      only), is one component of a     comprehensive MRSA colonization     surveillance program. It is not     intended to diagnose MRSA     infection nor to guide or     monitor treatment for     MRSA infections.  SURGICAL PCR SCREEN     Status: None   Collection Time    07/24/12  9:38 AM      Result Value Range Status   MRSA, PCR NEGATIVE  NEGATIVE Final   Staphylococcus aureus NEGATIVE  NEGATIVE Final   Comment:            The Xpert SA Assay (FDA     approved for NASAL specimens     in patients over 67 years of age),     is one  component of     a comprehensive surveillance     program.  Test performance has     been validated by [facility] for patients greater     than or equal to 56 year old.     It is not intended     to diagnose infection nor to     guide or monitor treatment.    Coagulation Studies:  Recent Labs  08/13/12 1620  LABPROT 14.0  INR 1.10    Urinalysis: No results found for this basename: COLORURINE, APPERANCEUR, LABSPEC, PHURINE, GLUCOSEU, HGBUR, BILIRUBINUR, KETONESUR, PROTEINUR, UROBILINOGEN, NITRITE, LEUKOCYTESUR,  in the last 168 hours  Lipid Panel:    Component Value Date/Time   CHOL 187 07/22/2012 0730   TRIG 87 07/22/2012 0730   HDL 50 07/22/2012 0730   CHOLHDL 3.7 07/22/2012 0730   VLDL 17 07/22/2012 0730   LDLCALC 120* 07/22/2012 0730    HgbA1C:  Lab Results  Component Value Date   HGBA1C 5.4 07/21/2012    Urine Drug Screen:   No results found for this basename: labopia, cocainscrnur, labbenz, amphetmu, thcu, labbarb    Alcohol Level: No results found for this basename: ETH,  in the last 168 hours  Other results: EKG: sinus rhythm rate 61 beats per minute  Imaging: CT scan of the head pending  Assessment: 62 y.o. female recovering from coronary artery bypass graft surgery performed approximately 2 weeks ago now presenting with confusion, word finding difficulties, decreased sensation of the right upper extremity and face,  and coordination problems with the right upper extremity. A CT scan is currently pending; however, it would appear that the patient has suffered a stroke. She was not a TPA candidate due to late presentation and recent surgery. She was on aspirin 325 mg daily prior to admission. This will be continued for now. She will be admitted for further evaluation and treatment.  Delton See PA-C [facility] Pager [phone] 08/13/2012, 6:05 PM  I have seen and evaluated the patient. I have reviewed the above note and made appropriate changes.   Stroke Risk Factors - family history, hyperlipidemia, hypertension and smoking  Plan: 1. HgbA1c, fasting lipid panel 2. MRI, MRA  of the brain without contrast 3. PT consult, OT consult, Speech consult 4. Echocardiogram 5. Carotid dopplers 6. Prophylactic therapy-Antiplatelet med: Aspirin - dose 325 mg daily 7. Risk factor modification 8. Telemetry monitoring 9. Frequent neuro checks    Ritta Slot, MD [facility] [phone]  If 7pm- 7am, please page neurology on call at [phone].

## 2012-08-13 NOTE — ED Provider Notes (Signed)
History    CSN: [id number] Arrival date & time 08/13/12  1451  First MD Initiated Contact with Patient 08/13/12 1502     Chief complaint right-sided weakness  (Consider location/radiation/quality/duration/timing/severity/associated sxs/prior Treatment) HPI  Level 5 caveat for altered expressive aphasia  Patient here for 2 sisters. Patient states sometime during the night she started noticing she was having difficulty using her right arm and leg. Her sister state their mother said she noticed that at 4 AM this morning. He took 4 people to help patient get out of the car to come to the emergency department. She denies headache, chest pain, or shortness of breath. She does not answer if she has numbness. She states she does feel funny on the right side of her face and she cannot use her right hand. Patient is normally right-handed. Patient has difficulty entering a lot of questions. She has repetitive speech.    PCP was in [facility] Cardiovascular physician Dr Vladimir Faster  Past Medical History  Diagnosis Date  . Hypertension   . Hyperlipidemia LDL goal < 70 07/24/2012  . STEMI (ST elevation myocardial infarction), secondary to disease of RCA, with disease of LAD and diag. branch and chronic non dominant LCX occlusion for CABG  07/21/2012  . 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 07/25/12 07/28/2012    POSTOPERATIVE DIAGNOSIS: Severe 3-vessel coronary disease, status post  myocardial infarction.  PROCEDURE: Median sternotomy, extracorporeal circulation, coronary  artery bypass grafting x5 (left internal mammary artery to LAD,  sequential saphenous vein graft to obtuse marginal 1, obtuse marginal 2,  and posterior descending with Y graft to 1st diagonal), endoscopic vein  harvest, left thigh.    . Tobacco abuse 07/21/2012   Past Surgical History  Procedure Laterality Date  . Coronary artery bypass graft N/A 07/25/2012    Procedure: CORONARY ARTERY BYPASS GRAFTING (CABG) times  five with Left  Endoscopic Saphenous Vein Harvest and Left Internal  Mammary ;  Surgeon: Loreli Slot, MD;  Location: [facility] OR;  Service: Open Heart Surgery;  Laterality: N/A;   No family history on file. History  Substance Use Topics  . Smoking status: Current Every Day Smoker -- 0.25 packs/day    Types: Cigarettes  . Smokeless tobacco: Not on file  . Alcohol Use: No  living with mother since CABG   OB History   Grav Para Term Preterm Abortions TAB SAB Ect Mult Living                 Review of Systems  All other systems reviewed and are negative.    Allergies  Review of patient's allergies indicates no known allergies.  Home Medications   Current Outpatient Rx  Name  Route  Sig  Dispense  Refill  . aspirin EC 325 MG EC tablet   Oral   Take 1 tablet (325 mg total) by mouth daily.   30 tablet   0   . atorvastatin (LIPITOR) 80 MG tablet   Oral   Take 1 tablet (80 mg total) by mouth daily at 6 PM.   30 tablet   1   . cloNIDine (CATAPRES) 0.2 MG tablet   Oral   Take 0.2 mg by mouth 2 (two) times daily.         . furosemide (LASIX) 40 MG tablet   Oral   Take 1 tablet (40 mg total) by mouth daily. For 5 days then stop.   5 tablet   0   . lisinopril (  PRINIVIL,ZESTRIL) 10 MG tablet   Oral   Take 1 tablet (10 mg total) by mouth daily.   30 tablet   1   . metoprolol (LOPRESSOR) 50 MG tablet   Oral   Take 1 tablet (50 mg total) by mouth 2 (two) times daily.   60 tablet   1   . potassium chloride SA (K-DUR,KLOR-CON) 20 MEQ tablet   Oral   Take 1 tablet (20 mEq total) by mouth daily. For 5 days then stop.   5 tablet   0   . traMADol (ULTRAM) 50 MG tablet   Oral   Take 1-2 tablets (50-100 mg total) by mouth every 6 (six) hours as needed (pain).   40 tablet   0    BP 134/75  Pulse 65  Temp(Src) 97.8 F (36.6 C) (Oral)  Resp 18  SpO2 98%  Vital signs normal   Physical Exam  Nursing note and vitals reviewed. Constitutional: She appears  well-developed and well-nourished.  Non-toxic appearance. She does not appear ill. No distress.  HENT:  Head: Normocephalic and atraumatic.    Right Ear: External ear normal.  Left Ear: External ear normal.  Nose: Nose normal. No mucosal edema or rhinorrhea.  Mouth/Throat: Oropharynx is clear and moist and mucous membranes are normal. No dental abscesses or edematous.  Visual field loss on right noted  Eyes: Conjunctivae and EOM are normal. Pupils are equal, round, and reactive to light.  Neck: Normal range of motion and full passive range of motion without pain. Neck supple.  Cardiovascular: Normal rate, regular rhythm and normal heart sounds.  Exam reveals no gallop and no friction rub.   No murmur heard. Pulmonary/Chest: Effort normal and breath sounds normal. No respiratory distress. She has no wheezes. She has no rhonchi. She has no rales. She exhibits no tenderness and no crepitus.  Abdominal: Soft. Normal appearance and bowel sounds are normal. She exhibits no distension. There is no tenderness. There is no rebound and no guarding.  Musculoskeletal: Normal range of motion. She exhibits no edema and no tenderness.  Moves all extremities well.   Neurological: She is alert. She has normal strength. No cranial nerve deficit.  Patient has difficulty following commands. She does not note BUN or the month. I had asked her who her PCP was and when asked other questions she kept going back to trying to answer that question and she couldn't. She has a severe visual field cut of her right eye. She cannot see anything until I am to the left of her nose, she has no visual field cut on the left eye. She has pronator drift on the right. She appears to have more problems lifting her left leg compared to the right leg against gravity although she can do both. She has no grip on the right and doesn't realize I have my hand in her right hand. She appears to be in origin her right side.  Skin: Skin is warm,  dry and intact. No rash noted. No erythema. No pallor.  Psychiatric: She has a normal mood and affect. Her speech is normal and behavior is normal. Her mood appears not anxious.    ED Course  Procedures (including critical care time)  NIH score 8  1545 Dr Amada Jupiter, here and made aware that patient will need to be seen  15:56 Radiology called CT results, acute/subacute L PCA infarct  Family and patient given results of the CT scan. Waiting for labs to return so she  can abe admitted.   18:00 Talked to Dr Lowella Dandy, CT is not crossing over into EPIC  18:10 Dr Arthor Captain, admit to tele, team 10  Results for orders placed during the hospital encounter of 08/13/12  PROTIME-INR      Result Value Range   Prothrombin Time 14.0  11.6 - 15.2 seconds   INR 1.10  0.00 - 1.49  APTT      Result Value Range   aPTT 26  24 - 37 seconds  CBC      Result Value Range   WBC 8.8  4.0 - 10.5 K/uL   RBC 3.66 (*) 3.87 - 5.11 MIL/uL   Hemoglobin 11.5 (*) 12.0 - 15.0 g/dL   HCT 16.1 (*) 09.6 - 04.5 %   MCV 93.2  78.0 - 100.0 fL   MCH 31.4  26.0 - 34.0 pg   MCHC 33.7  30.0 - 36.0 g/dL   RDW 40.9  81.1 - 91.4 %   Platelets 427 (*) 150 - 400 K/uL  DIFFERENTIAL      Result Value Range   Neutrophils Relative % 78 (*) 43 - 77 %   Neutro Abs 6.8  1.7 - 7.7 K/uL   Lymphocytes Relative 13  12 - 46 %   Lymphs Abs 1.1  0.7 - 4.0 K/uL   Monocytes Relative 8  3 - 12 %   Monocytes Absolute 0.7  0.1 - 1.0 K/uL   Eosinophils Relative 1  0 - 5 %   Eosinophils Absolute 0.0  0.0 - 0.7 K/uL   Basophils Relative 1  0 - 1 %   Basophils Absolute 0.1  0.0 - 0.1 K/uL  COMPREHENSIVE METABOLIC PANEL      Result Value Range   Sodium 136  135 - 145 mEq/L   Potassium 3.6  3.5 - 5.1 mEq/L   Chloride 96  96 - 112 mEq/L   CO2 25  19 - 32 mEq/L   Glucose, Bld 105 (*) 70 - 99 mg/dL   BUN 10  6 - 23 mg/dL   Creatinine, Ser 7.82  0.50 - 1.10 mg/dL   Calcium 9.9  8.4 - 95.6 mg/dL   Total Protein 7.5  6.0 - 8.3 g/dL   Albumin  3.3 (*) 3.5 - 5.2 g/dL   AST 19  0 - 37 U/L   ALT 23  0 - 35 U/L   Alkaline Phosphatase 77  39 - 117 U/L   Total Bilirubin 0.6  0.3 - 1.2 mg/dL   GFR calc non Af Amer >90  >90 mL/min   GFR calc Af Amer >90  >90 mL/min  TROPONIN I      Result Value Range   Troponin I <0.30  <0.30 ng/mL  GLUCOSE, CAPILLARY      Result Value Range   Glucose-Capillary 97  70 - 99 mg/dL  POCT I-STAT TROPONIN I      Result Value Range   Troponin i, poc 0.03  0.00 - 0.08 ng/mL   Comment 3            Laboratory interpretation all normal except mild anemia   No results found. CT done, result not crossing over into EPIC    Date: 08/13/2012  Rate: 61  Rhythm: normal sinus rhythm  QRS Axis: right  Intervals: normal  ST/T Wave abnormalities: nonspecific T wave changes  Conduction Disutrbances:right bundle branch block  Narrative Interpretation:   Old EKG Reviewed: changes noted from 07/23/2012 RBBB is new  1. Acute ischemic stroke     Plan admission   Devoria Albe, MD, FACEP  CRITICAL CARE Performed by: Devoria Albe L Total critical care time: 31 min  Critical care time was exclusive of separately billable procedures and treating other patients. Critical care was necessary to treat or prevent imminent or life-threatening deterioration. Critical care was time spent personally by me on the following activities: development of treatment plan with patient and/or surrogate as well as nursing, discussions with consultants, evaluation of patient's response to treatment, examination of patient, obtaining history from patient or surrogate, ordering and performing treatments and interventions, ordering and review of laboratory studies, ordering and review of radiographic studies, pulse oximetry and re-evaluation of patient's condition.   MDM    Ward Givens, MD 08/13/12 [phone]

## 2012-08-13 NOTE — ED Notes (Signed)
Pt fell at 0400 and right side is not moving good just today.  Pt had bypass on 07/25/2012.  Pt is not remembering things and voice sounds different to family.  Pt tries to follow commands and seems like the actions are opposite like asking to smile she takes deep breath.  Right side is weak

## 2012-08-13 NOTE — H&P (Signed)
[facility] History and Physical  [name] [name] ZOX:[id number] DOB: 05-18-1950 DOA: 08/13/2012  Referring physician: Ward Givens, MD PCP: No primary provider on file.  Specialists:   Chief Complaint: Right-sided weakness  HPI: [name] [name] is a 62 y.o. female with past medical history of hypertension and recent STEMI status post CABG with x5 bypass, she was discharged from the hospital on 07/31/2012 after she had her CABG. Patient was recovering at her mother's house. Per her and her family about 4:30 this morning patient fell, then she did have slurred speech and right-sided weakness. Patient did not want to come to the emergency department but finally family was able to bring her. Initial evaluation in the emergency department with CT scan showed probable stroke in the left PCA territory. Seen by neurology already, tPA was not given because of she is out of its window.  Review of Systems:  Constitutional: negative for anorexia, fevers and sweats Eyes: negative for irritation, redness and visual disturbance Ears, nose, mouth, throat, and face: negative for earaches, epistaxis, nasal congestion and sore throat Respiratory: negative for cough, dyspnea on exertion, sputum and wheezing Cardiovascular: negative for chest pain, dyspnea, lower extremity edema, orthopnea, palpitations and syncope Gastrointestinal: negative for abdominal pain, constipation, diarrhea, melena, nausea and vomiting Genitourinary:negative for dysuria, frequency and hematuria Hematologic/lymphatic: negative for bleeding, easy bruising and lymphadenopathy Musculoskeletal:negative for arthralgias, muscle weakness and stiff joints Neurological: negative for coordination problems, gait problems, headaches and weakness Endocrine: negative for diabetic symptoms including polydipsia, polyuria and weight loss Allergic/Immunologic: negative for anaphylaxis, hay fever and urticaria  Past Medical History  Diagnosis Date  .  Hypertension   . Hyperlipidemia LDL goal < 70 07/24/2012  . STEMI (ST elevation myocardial infarction), secondary to disease of RCA, with disease of LAD and diag. branch and chronic non dominant LCX occlusion for CABG  07/21/2012  . 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 07/25/12 07/28/2012    POSTOPERATIVE DIAGNOSIS: Severe 3-vessel coronary disease, status post  myocardial infarction.  PROCEDURE: Median sternotomy, extracorporeal circulation, coronary  artery bypass grafting x5 (left internal mammary artery to LAD,  sequential saphenous vein graft to obtuse marginal 1, obtuse marginal 2,  and posterior descending with Y graft to 1st diagonal), endoscopic vein  harvest, left thigh.    . Tobacco abuse 07/21/2012   Past Surgical History  Procedure Laterality Date  . Coronary artery bypass graft N/A 07/25/2012    Procedure: CORONARY ARTERY BYPASS GRAFTING (CABG) times five with Left  Endoscopic Saphenous Vein Harvest and Left Internal  Mammary ;  Surgeon: Loreli Slot, MD;  Location[facility] OR;  Service: Open Heart Surgery;  Laterality: N/A;   Social History:  reports that she has been smoking Cigarettes.  She has been smoking about 0.25 packs per day. She does not have any smokeless tobacco history on file. She reports that she does not drink alcohol or use illicit drugs.   No Known Allergies  Family History  Problem Relation Age of Onset  . Hypertension Mother   . Stroke Brother     Died at age 65 secondary to MI  . CAD Brother      Prior to Admission medications   Medication Sig Start Date End Date Taking? Authorizing Provider  aspirin EC 325 MG tablet Take 325 mg by mouth daily.   Yes Historical Provider, MD  atorvastatin (LIPITOR) 80 MG tablet Take 80 mg by mouth daily.   Yes Historical Provider, MD  cloNIDine (CATAPRES) 0.2 MG tablet  Take 0.2 mg by mouth 2 (two) times daily.   Yes Historical Provider, MD  lisinopril (PRINIVIL,ZESTRIL) 10 MG tablet Take 10 mg by mouth  daily.   Yes Historical Provider, MD  metoprolol (LOPRESSOR) 50 MG tablet Take 50 mg by mouth 2 (two) times daily.   Yes Historical Provider, MD  traMADol (ULTRAM) 50 MG tablet Take 50-100 mg by mouth every 6 (six) hours as needed for pain.   Yes Historical Provider, MD   Physical Exam: Filed Vitals:   08/13/12 1800 08/13/12 1815 08/13/12 1830 08/13/12 1845  BP: 156/75 135/108 146/78 179/80  Pulse: 55 58 59 61  Temp:      TempSrc:      Resp: 18 26 23 13   SpO2: 99% 99% 99% 100%  General appearance: alert, cooperative and no distress  Head: Normocephalic, without obvious abnormality, atraumatic  Eyes: conjunctivae/corneas clear. PERRL, EOM's intact. Fundi benign.  Nose: Nares normal. Septum midline. Mucosa normal. No drainage or sinus tenderness.  Throat: lips, mucosa, and tongue normal; teeth and gums normal  Neck: Supple, no masses, no cervical lymphadenopathy, no JVD appreciated, no meningeal signs Resp: clear to auscultation bilaterally  Chest wall: no tenderness  Cardio: regular rate and rhythm, S1, S2 normal, no murmur, click, rub or gallop  GI: soft, non-tender; bowel sounds normal; no masses, no organomegaly  Extremities: extremities normal, atraumatic, no cyanosis or edema  Skin: Skin color, texture, turgor normal. No rashes or lesions  Neurologic: Alert and oriented X 3, normal strength and tone. Normal symmetric reflexes. Normal coordination and gait   Labs on Admission:  Basic Metabolic Panel:  Recent Labs Lab 08/13/12 1620  NA 136  K 3.6  CL 96  CO2 25  GLUCOSE 105*  BUN 10  CREATININE 0.63  CALCIUM 9.9   Liver Function Tests:  Recent Labs Lab 08/13/12 1620  AST 19  ALT 23  ALKPHOS 77  BILITOT 0.6  PROT 7.5  ALBUMIN 3.3*   No results found for this basename: LIPASE, AMYLASE,  in the last 168 hours No results found for this basename: AMMONIA,  in the last 168 hours CBC:  Recent Labs Lab 08/13/12 1620  WBC 8.8  NEUTROABS 6.8  HGB 11.5*  HCT  34.1*  MCV 93.2  PLT 427*   Cardiac Enzymes:  Recent Labs Lab 08/13/12 1620  TROPONINI <0.30    BNP (last 3 results)  Recent Labs  07/21/12 2337  PROBNP 1358.0*   CBG:  Recent Labs Lab 08/13/12 1525  GLUCAP 97    Radiological Exams on Admission: No results found.  EKG: Independently reviewed.   Assessment/Plan Principal Problem:   Acute CVA (cerebrovascular accident) Active Problems:   Essential hypertension   Tobacco abuse   S/P CABG x 5 : LIMA-LAD, SVG-OM1-OM2, SVG-(Y)-DIAG-PDA. 07/25/12   Acute CVA -Patient out of TPA window, seen by neurology already. -She was on aspirin, currently on full dose aspirin, neurology please advise if this considered as aspirin failure. -Stroke workup including MRI of the brain, carotid duplex, 2-D echocardiogram. -She'll be on telemetry, 12-lead EKG will be obtained. -Check hemoglobin A1c, TSH. Patient to be seen by PT/OT/SLP.  Essential hypertension -Patient is on metoprolol and clonidine, continue preadmission medications. -Blood pressure is 179/80, likely secondary to the stroke. -Avoid aggressive control of the blood pressure special in the first 24 hours.  CAD/ s/p CABG -Status post CABG x5 done on 07/25/2012, by Dr. Dorris Fetch. -We will notify him in a.m. as a courtesy. -On aspirin continue, continue  beta blockers.  Tobacco abuse -Counseled extensively, patient said currently she does not smoke.  Code Status: Full code Family Communication: Plan discussed with her sister and cousin at bedside. Disposition Plan: Inpatient, neuro telemetry, anticipate length of stay to be greater than 2 mid nights  Time spent: 70 minutes  [facility] Pager [phone]  If 7PM-7AM, please contact night-coverage [email or web address] Password [facility] 08/13/2012, 6:51 PM

## 2012-08-13 NOTE — Progress Notes (Signed)
Pt arrived on 4N. Set up on tele, given bed alarm. Will give report to upcoming nurse  Minor, Yvette Rack

## 2012-08-14 ENCOUNTER — Inpatient Hospital Stay (HOSPITAL_COMMUNITY): Payer: Medicaid Other

## 2012-08-14 DIAGNOSIS — Z951 Presence of aortocoronary bypass graft: Secondary | ICD-10-CM

## 2012-08-14 DIAGNOSIS — I369 Nonrheumatic tricuspid valve disorder, unspecified: Secondary | ICD-10-CM

## 2012-08-14 LAB — LIPID PANEL
Cholesterol: 90 mg/dL (ref 0–200)
Total CHOL/HDL Ratio: 4.1 RATIO

## 2012-08-14 LAB — TROPONIN I: Troponin I: <0.3 ng/mL (ref <0.30)

## 2012-08-14 LAB — HEMOGLOBIN A1C
Hgb A1c MFr Bld: 5.4 % (ref <5.7)
Mean Plasma Glucose: 108 mg/dL (ref <117)

## 2012-08-14 MED ORDER — GADOBENATE DIMEGLUMINE 529 MG/ML IV SOLN: 20.0000 mL | Freq: Once | INTRAVENOUS | Status: DC

## 2012-08-14 NOTE — Progress Notes (Signed)
[facility] PROGRESS NOTE  [name] [name] ZOX:[id number] DOB: April 18, 1950 DOA: 08/13/2012 PCP: No primary provider on file.  Assessment/Plan: Acute nonhemorrhagic stroke -Left posterior cerebral artery distribution -MRI brain shows severe distal left PCA disease -Neurology following -Continue aspirin -Hemoglobin A1c 5.4 -LDL 54, TSH 0.570 -Discussed with stroke team today (08/14/12)--due to concerns for embolic source--TEE has been ordered -Stroke team has arranged for TEE 08/15/12 -implantable loop recorder planned for 08/15/12 due to concerns of undx Afib -continue PT/OT Encephalopathy -Check UA and urine culture -Serum B12, RBC folate Hypertension -Continue lisinopril, clonidine and metoprolol tartrate Hyperlipidemia -Continue Lipitor Tobacco abuse -Tobacco cessation discussed Coronary artery disease -Status post CABG x5 vessels 07/25/2012--Dr. Dorris Fetch -no post-CABG Afib from review of records  Family Communication:   sister at beside Disposition Plan:   CIR vs SNF         Procedures/Studies: Dg Chest 2 View  08/14/2012   *RADIOLOGY REPORT*  Clinical Data: Right arm weakness.  Stroke.  CHEST - 2 VIEW  Comparison: 07/28/2012 and 07/24/2012 radiographs.  Findings: The heart size is stable status post CABG.  The pleural effusions and bibasilar atelectasis have improved.  There is no edema or pneumothorax.  The osseous structures appear unchanged.  IMPRESSION: Improving basilar aeration and pleural effusion status post CABG.   Original Report Authenticated By: Carey Bullocks, M.D.   Dg Chest 2 View  07/28/2012   *RADIOLOGY REPORT*  Clinical Data: 62 year old female status post cardiac surgery.  ST elevation myocardial infarction.  Hypertension.  CHEST - 2 VIEW  Comparison: 07/27/2012 and earlier.  Findings: Right IJ introducer sheath has been removed.  Epicardial pacer wires remain in place.  Sequelae of CABG.  Stable cardiac size and mediastinal contours.  Small to  moderate bilateral pleural effusions with fluid in the fissures and perihilar and basilar atelectasis.  No pulmonary edema or pneumothorax. Stable visualized osseous structures.  IMPRESSION: 1.  Right IJ introducer sheath removed.  Epicardial pacer wires remain. 2.  Small to moderate bilateral pleural effusions with atelectasis.   Original Report Authenticated By: Erskine Speed, M.D.   Dg Chest 2 View  07/24/2012   *RADIOLOGY REPORT*  Clinical Data: Pre CABG.  CHEST - 2 VIEW  Comparison: None.  Findings: Trachea is midline.  Heart size normal.  Lungs appear mildly hyperinflated but otherwise clear.  No pleural fluid.  IMPRESSION: No acute findings.   Original Report Authenticated By: Leanna Battles, M.D.   Mr Brain Wo Contrast  08/14/2012   *RADIOLOGY REPORT*  Clinical Data:  Right-sided weakness.  Stroke risk factors include hypertension, hyperlipidemia, prior myocardial infarction, and tobacco abuse.  MRI HEAD WITHOUT CONTRAST MRA HEAD WITHOUT CONTRAST  Technique:  Multiplanar, multiecho pulse sequences of the brain and surrounding structures were obtained without intravenous contrast. Angiographic images of the head were obtained using MRA technique without contrast.  Comparison:  CT head 08/13/2012.  MRI HEAD  Findings:  Large acute left hemisphere infarct affects predominately  PCA territory including the posterior temporal lobe, much of the occipital lobe, portions of the parietal lobe, and much of the thalamus and posterior limb internal capsule, all on the left. There is no acute or subacute hemorrhage.  There is no midline shift.  There is a remote left parietal cortical and subcortical infarct with associated gliosis and encephalomalacia.  This is associated with slight chronic gyral microhemorrhage.  This infarct lies within the left MCA distribution.  There is mild atrophy.  There is mild chronic microvascular ischemic change.  Normal pituitary.  Negative osseous structures. Extracranial soft  tissues unremarkable.  IMPRESSION: Acute non hemorrhagic left PCA territory infarct affecting most of that vessel's vascular territory. Chronic left parietal MCA territory infarct.  MRA HEAD  Findings: Significant motion degradation.  Gross patency of the carotid arteries is established.  The basilar artery is patent with left vertebral dominant. The left vertebral is occluded except for its distal most segment.  This could fill via retrograde flow from the basilar.  There is no appreciable flow related enhancement of the right vertebral at the skull base.  There is no proximal flow limiting stenosis of the middle cerebral arteries.  Focal narrowing at the origin of the right anterior cerebral artery could be 75%. Severe disease bilateral PCA vessels, greater on the left. Moderate irregularity distal MCA vessels bilaterally.  IMPRESSION: Motion degraded scan.  Severe disease distal left PCA.  Basilar artery grossly patent. Abnormal distal right vertebral.   Original Report Authenticated By: Davonna Belling, M.D.   Dg Chest Port 1 View  07/27/2012   *RADIOLOGY REPORT*  Clinical Data: Post CABG  PORTABLE CHEST - 1 VIEW  Comparison: 07/26/2012; 07/25/2012  Findings: Grossly unchanged cardiac silhouette and mediastinal contours post median sternotomy and CABG.  Atherosclerotic calcifications within the thoracic aorta.  Interval removal of mediastinal drain and left-sided chest tube.  Interval removal of PA catheter with remaining vascular sheath tip overlying the mid SVC.  No pneumothorax.  Overall improved aeration of the lungs with persistent bibasilar heterogeneous opacities.  Unchanged trace bilateral effusions.  Unchanged bones.  IMPRESSION: 1.  Interval removal of support apparatus as above.  No pneumothorax. 2.  Improved pulmonary edema. 3.  Persistent trace bilateral effusions and bibasilar atelectasis.   Original Report Authenticated By: Tacey Ruiz, MD   Dg Chest Portable 1 View In Am  07/26/2012    *RADIOLOGY REPORT*  Clinical Data: Post CABG  PORTABLE CHEST - 1 VIEW  Comparison: Portable exam 0708 hours compared to 06/24 1014  Findings: Endotracheal and nasogastric tubes removed. Mediastinal drain and left thoracostomy tube unchanged. Tip of right jugular Swan-Ganz catheter projects over distal main pulmonary artery. Epicardial pacing leads noted.  Normal heart size post CABG. Atherosclerotic calcification aorta. Perihilar interstitial infiltrates likely edema. No gross pleural effusion or pneumothorax.  IMPRESSION: Mild pulmonary edema. Line and tube positions as above.   Original Report Authenticated By: Ulyses Southward, M.D.   Dg Chest Portable 1 View  07/25/2012   *RADIOLOGY REPORT*  Clinical Data: Postop CABG.  PORTABLE CHEST - 1 VIEW  Comparison: 07/24/2012.  Findings: Endotracheal tube tip 4.5 cm above the carina.  Right-sided Swan-Ganz catheter tip main pulmonary outflow tract.  Mediastinal drains and left-sided chest tube in place.  No gross pneumothorax.  Epicardial leads are in place.  Radiopaque structure aortic pulmonary window region consistent with surgical clips.  Heart size within normal limits.  Mild pulmonary vascular prominence.  Nasogastric tube courses below the diaphragm.  The tip is not included on this exam.  IMPRESSION: Endotracheal tube tip 4.5 cm above the carina.  Right-sided Swan-Ganz catheter tip main pulmonary outflow tract.  Mediastinal drains and left-sided chest tube in place.  No gross pneumothorax.  Mild pulmonary vascular prominence.   Original Report Authenticated By: Lacy Duverney, M.D.   Mr Mra Head/brain Wo Cm  08/14/2012   *RADIOLOGY REPORT*  Clinical Data:  Right-sided weakness.  Stroke risk factors include hypertension, hyperlipidemia, prior myocardial infarction, and tobacco abuse.  MRI HEAD WITHOUT CONTRAST MRA HEAD WITHOUT CONTRAST  Technique:  Multiplanar, multiecho pulse sequences of the brain and surrounding structures were obtained without intravenous  contrast. Angiographic images of the head were obtained using MRA technique without contrast.  Comparison:  CT head 08/13/2012.  MRI HEAD  Findings:  Large acute left hemisphere infarct affects predominately  PCA territory including the posterior temporal lobe, much of the occipital lobe, portions of the parietal lobe, and much of the thalamus and posterior limb internal capsule, all on the left. There is no acute or subacute hemorrhage.  There is no midline shift.  There is a remote left parietal cortical and subcortical infarct with associated gliosis and encephalomalacia.  This is associated with slight chronic gyral microhemorrhage.  This infarct lies within the left MCA distribution.  There is mild atrophy.  There is mild chronic microvascular ischemic change.  Normal pituitary.    Negative osseous structures. Extracranial soft tissues unremarkable.  IMPRESSION: Acute non hemorrhagic left PCA territory infarct affecting most of that vessel's vascular territory. Chronic left parietal MCA territory infarct.  MRA HEAD  Findings: Significant motion degradation.  Gross patency of the carotid arteries is established.  The basilar artery is patent with left vertebral dominant. The left vertebral is occluded except for its distal most segment.  This could fill via retrograde flow from the basilar.  There is no appreciable flow related enhancement of the right vertebral at the skull base.  There is no proximal flow limiting stenosis of the middle cerebral arteries.  Focal narrowing at the origin of the right anterior cerebral artery could be 75%. Severe disease bilateral PCA vessels, greater on the left. Moderate irregularity distal MCA vessels bilaterally.  IMPRESSION: Motion degraded scan.  Severe disease distal left PCA.  Basilar artery grossly patent. Abnormal distal right vertebral.   Original Report Authenticated By: Davonna Belling, M.D.         Subjective: Patient is pleasantly confused. Denies any  headaches, fevers, chills, chest pain, shortness breath, vomiting, diarrhea, dysuria.  Objective: Filed Vitals:   08/14/12 0500 08/14/12 0643 08/14/12 1000 08/14/12 1400  BP: 142/65 150/67 153/75 128/72  Pulse: 111 61 64 56  Temp: 98.6 F (37 C) 98.7 F (37.1 C) 98.4 F (36.9 C) 99 F (37.2 C)  TempSrc:   Oral Oral  Resp: 18 16 17 17   Height:      Weight:      SpO2: 92% 100% 100% 99%    Intake/Output Summary (Last 24 hours) at 08/14/12 1516 Last data filed at 08/14/12 0200  Gross per 24 hour  Intake      0 ml  Output    200 ml  Net   -200 ml   Weight change:  Exam:   General:  Pt is alert, follows commands appropriately, not in acute distress  HEENT: No icterus, No thrush, /AT  Cardiovascular: RRR, S1/S2, no rubs, no gallops  Respiratory: CTA bilaterally, no wheezing, no crackles, no rhonchi  Abdomen: Soft/+BS, non tender, non distended, no guarding  Extremities: trace edema, No lymphangitis, No petechiae, No rashes, no synovitis  Data Reviewed: Basic Metabolic Panel:  Recent Labs Lab 08/13/12 1620  NA 136  K 3.6  CL 96  CO2 25  GLUCOSE 105*  BUN 10  CREATININE 0.63  CALCIUM 9.9   Liver Function Tests:  Recent Labs Lab 08/13/12 1620  AST 19  ALT 23  ALKPHOS 77  BILITOT 0.6  PROT 7.5  ALBUMIN 3.3*   No results found for this basename: LIPASE, AMYLASE,  in the last 168  hours No results found for this basename: AMMONIA,  in the last 168 hours CBC:  Recent Labs Lab 08/13/12 1620  WBC 8.8  NEUTROABS 6.8  HGB 11.5*  HCT 34.1*  MCV 93.2  PLT 427*   Cardiac Enzymes:  Recent Labs Lab 08/13/12 1620  TROPONINI <0.30   BNP: No components found with this basename: POCBNP,  CBG:  Recent Labs Lab 08/13/12 1525  GLUCAP 97    No results found for this or any previous visit (from the past 240 hour(s)).   Scheduled Meds: . aspirin  300 mg Rectal Daily   Or  . aspirin  325 mg Oral Daily  . atorvastatin  80 mg Oral q1800  .  cloNIDine  0.2 mg Oral BID  . gadobenate dimeglumine  20 mL Intravenous Once  . heparin  5,000 Units Subcutaneous Q8H  . lisinopril  10 mg Oral Daily  . metoprolol  50 mg Oral BID   Continuous Infusions: . sodium chloride 1,000 mL (08/13/12 2015)     Tanner Yeley, DO  [facility] Pager [phone]  If 7PM-7AM, please contact night-coverage [email or web address] Password [email or web address] 08/14/2012, 3:16 PM   LOS: 1 day

## 2012-08-14 NOTE — Consult Note (Signed)
Physical Medicine and Rehabilitation Consult Reason for Consult: CVA Referring Physician: [facility]   HPI: [name] [name] is a 62 y.o. right-handed female with history of hypertension, tobacco abuse, coronary artery disease with coronary artery bypass grafting and recent discharge after her CABG 07/31/2012. Admitted 08/13/2012 with right-sided weakness and aphasia. MRI of the brain showed acute nonhemorrhagic left PCA territory infarct MRA of the head was severe disease distal left PCA and basilar artery grossly patent. Echocardiogram with ejection fraction of 60% grade 2 diastolic dysfunction. Carotid Dopplers with less than 39% ICA stenosis. TEE is pending. Neurology services consulted maintained on aspirin therapy as prior to hospital admission as well as the addition of subcutaneous heparin for DVT prophylaxis. Patient did not receive TPA. Physical therapy evaluation completed with recommendations of physical medicine rehabilitation consult to consider inpatient rehabilitation services.    Review of Systems  Respiratory: Positive for cough.   Cardiovascular: Positive for chest pain.  Gastrointestinal: Positive for constipation.  Neurological: Positive for weakness.  All other systems reviewed and are negative.   Past Medical History  Diagnosis Date  . Hypertension   . Hyperlipidemia LDL goal < 70 07/24/2012  . STEMI (ST elevation myocardial infarction), secondary to disease of RCA, with disease of LAD and diag. branch and chronic non dominant LCX occlusion for CABG  07/21/2012  . 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 07/25/12 07/28/2012    POSTOPERATIVE DIAGNOSIS: Severe 3-vessel coronary disease, status post  myocardial infarction.  PROCEDURE: Median sternotomy, extracorporeal circulation, coronary  artery bypass grafting x5 (left internal mammary artery to LAD,  sequential saphenous vein graft to obtuse marginal 1, obtuse marginal 2,  and posterior descending with Y graft to 1st  diagonal), endoscopic vein  harvest, left thigh.    . Tobacco abuse 07/21/2012   Past Surgical History  Procedure Laterality Date  . Coronary artery bypass graft N/A 07/25/2012    Procedure: CORONARY ARTERY BYPASS GRAFTING (CABG) times five with Left  Endoscopic Saphenous Vein Harvest and Left Internal  Mammary ;  Surgeon: Loreli Slot, MD;  Location: [facility];  Service: Open Heart Surgery;  Laterality: N/A;   Family History  Problem Relation Age of Onset  . Hypertension Mother   . Stroke Brother     Died at age 100 secondary to MI  . CAD Brother    Social History:  reports that she has been smoking Cigarettes.  She has been smoking about 0.25 packs per day. She does not have any smokeless tobacco history on file. She reports that she does not drink alcohol or use illicit drugs. Allergies: No Known Allergies Medications Prior to Admission  Medication Sig Dispense Refill  . aspirin EC 325 MG tablet Take 325 mg by mouth daily.      Marland Kitchen atorvastatin (LIPITOR) 80 MG tablet Take 80 mg by mouth daily.      . cloNIDine (CATAPRES) 0.2 MG tablet Take 0.2 mg by mouth 2 (two) times daily.      Marland Kitchen lisinopril (PRINIVIL,ZESTRIL) 10 MG tablet Take 10 mg by mouth daily.      . metoprolol (LOPRESSOR) 50 MG tablet Take 50 mg by mouth 2 (two) times daily.      . traMADol (ULTRAM) 50 MG tablet Take 50-100 mg by mouth every 6 (six) hours as needed for pain.        Home: Home Living Family/patient expects to be discharged to:: Private residence Living Arrangements: Parent;Other (Comment) (since CABG) Available Help at Discharge: Family;Available 24 hours/day;Other (  Comment) Type of Home: House Home Access: Stairs to enter [employer] of Steps: 4-5 Entrance Stairs-Rails: None Home Layout: One level Home Equipment: Walker - 2 wheels;Cane - single point  Functional History: Prior Function Comments: pt was independent and lived alone prior to CABG; has been living with mother since; was amb  without SPC or RW in the last few days prior to hospitalization Functional Status:  Mobility: Bed Mobility Bed Mobility: Supine to Sit;Sitting - Scoot to Edge of Bed Supine to Sit: 3: Mod assist;HOB elevated;With rails Sitting - Scoot to Edge of Bed: 3: Mod assist;With rail Transfers Transfers: Sit to Stand;Stand to Sit Sit to Stand: 3: Mod assist;From elevated surface;With upper extremity assist;From bed Stand to Sit: 3: Mod assist;To chair/3-in-1;With armrests;With upper extremity assist Ambulation/Gait Ambulation/Gait Assistance: 3: Mod assist Ambulation Distance (Feet): 10 Feet (x2) Assistive device: Rolling walker Ambulation/Gait Assistance Details: requires (A) to facilitate grasp of R UE onto RW: without (A) pt will not use R UE on RW and pushes RW with L UE; pt tends to drag R LE through swing phase and requires (A) to maintain balance; pt unaware that she amb with lean to Left and was instructed continuously to find midline Gait Pattern: Decreased weight shift to right;Decreased hip/knee flexion - right;Decreased stride length;Decreased stance time - left;Trunk flexed (R LE ER constantly ) Gait velocity: decreased Stairs: No Wheelchair Mobility Wheelchair Mobility: No  ADL:    Cognition: Cognition Overall Cognitive Status: Impaired/Different from baseline Arousal/Alertness: Awake/alert Orientation Level: Oriented to person;Oriented to place;Disoriented to time;Disoriented to situation Attention: Focused;Sustained;Selective Focused Attention: Appears intact Sustained Attention: Appears intact Selective Attention: Appears intact Memory:  (UTA) Awareness: Impaired Awareness Impairment: Intellectual impairment;Emergent impairment Problem Solving: Impaired Problem Solving Impairment: Verbal basic;Functional basic Cognition Arousal/Alertness: Awake/alert Behavior During Therapy: Impulsive Overall Cognitive Status: Impaired/Different from baseline Area of Impairment:  Orientation;Attention;Memory;Following commands;Safety/judgement;Awareness;Problem solving Orientation Level: Disoriented to;Place;Situation Current Attention Level: Sustained Memory: Decreased recall of precautions Following Commands: Follows one step commands with increased time;Follows one step commands consistently Safety/Judgement: Decreased awareness of safety;Decreased awareness of deficits Awareness: Intellectual Problem Solving: Slow processing;Decreased initiation;Difficulty sequencing;Requires verbal cues;Requires tactile cues General Comments: pt is a poor historian, is aphasic, perseverates on certain phrases and questions during session; sister present to give baseline cognition and PLOF   Blood pressure 128/72, pulse 56, temperature 99 F (37.2 C), temperature source Oral, resp. rate 17, height 5\' 1"  (1.549 m), weight 53.6 kg (118 lb 2.7 oz), SpO2 99.00%. Physical Exam  Vitals reviewed. HENT:  Head: Normocephalic.  Eyes: EOM are normal.  Neck: Normal range of motion. Neck supple. No thyromegaly present.  Cardiovascular:  Cardiac rate controlled  Pulmonary/Chest: Breath sounds normal. No respiratory distress.  Abdominal: Soft. Bowel sounds are normal. She exhibits no distension.  Neurological: She is alert.  Patient was able to name person, place and date of birth. She was a bit impulsive. Follows three-step commands. Significant ataxia right arm and leg. Decreased PP and LT in right arm and leg as well. Reasonable insight and awareness.   Skin:  Midline chest incision well-healed  Psychiatric: She has a normal mood and affect. Her behavior is normal. Judgment and thought content normal.    Results for orders placed during the hospital encounter of 08/13/12 (from the past 24 hour(s))  PROTIME-INR     Status: None   Collection Time    08/13/12  4:20 PM      Result Value Range   Prothrombin Time 14.0  11.6 - 15.2  seconds   INR 1.10  0.00 - 1.49  APTT     Status: None    Collection Time    08/13/12  4:20 PM      Result Value Range   aPTT 26  24 - 37 seconds  CBC     Status: Abnormal   Collection Time    08/13/12  4:20 PM      Result Value Range   WBC 8.8  4.0 - 10.5 K/uL   RBC 3.66 (*) 3.87 - 5.11 MIL/uL   Hemoglobin 11.5 (*) 12.0 - 15.0 g/dL   HCT 16.1 (*) 09.6 - 04.5 %   MCV 93.2  78.0 - 100.0 fL   MCH 31.4  26.0 - 34.0 pg   MCHC 33.7  30.0 - 36.0 g/dL   RDW 40.9  81.1 - 91.4 %   Platelets 427 (*) 150 - 400 K/uL  DIFFERENTIAL     Status: Abnormal   Collection Time    08/13/12  4:20 PM      Result Value Range   Neutrophils Relative % 78 (*) 43 - 77 %   Neutro Abs 6.8  1.7 - 7.7 K/uL   Lymphocytes Relative 13  12 - 46 %   Lymphs Abs 1.1  0.7 - 4.0 K/uL   Monocytes Relative 8  3 - 12 %   Monocytes Absolute 0.7  0.1 - 1.0 K/uL   Eosinophils Relative 1  0 - 5 %   Eosinophils Absolute 0.0  0.0 - 0.7 K/uL   Basophils Relative 1  0 - 1 %   Basophils Absolute 0.1  0.0 - 0.1 K/uL  COMPREHENSIVE METABOLIC PANEL     Status: Abnormal   Collection Time    08/13/12  4:20 PM      Result Value Range   Sodium 136  135 - 145 mEq/L   Potassium 3.6  3.5 - 5.1 mEq/L   Chloride 96  96 - 112 mEq/L   CO2 25  19 - 32 mEq/L   Glucose, Bld 105 (*) 70 - 99 mg/dL   BUN 10  6 - 23 mg/dL   Creatinine, Ser 7.82  0.50 - 1.10 mg/dL   Calcium 9.9  8.4 - 95.6 mg/dL   Total Protein 7.5  6.0 - 8.3 g/dL   Albumin 3.3 (*) 3.5 - 5.2 g/dL   AST 19  0 - 37 U/L   ALT 23  0 - 35 U/L   Alkaline Phosphatase 77  39 - 117 U/L   Total Bilirubin 0.6  0.3 - 1.2 mg/dL   GFR calc non Af Amer >90  >90 mL/min   GFR calc Af Amer >90  >90 mL/min  TROPONIN I     Status: None   Collection Time    08/13/12  4:20 PM      Result Value Range   Troponin I <0.30  <0.30 ng/mL  POCT I-STAT TROPONIN I     Status: None   Collection Time    08/13/12  4:31 PM      Result Value Range   Troponin i, poc 0.03  0.00 - 0.08 ng/mL   Comment 3           HEMOGLOBIN A1C     Status: None    Collection Time    08/14/12  6:35 AM      Result Value Range   Hemoglobin A1C 5.4  <5.7 %   Mean Plasma Glucose 108  <117 mg/dL  LIPID PANEL     Status: Abnormal   Collection Time    08/14/12  6:35 AM      Result Value Range   Cholesterol 90  0 - 200 mg/dL   Triglycerides 72  <811 mg/dL   HDL 22 (*) >91 mg/dL   Total CHOL/HDL Ratio 4.1     VLDL 14  0 - 40 mg/dL   LDL Cholesterol 54  0 - 99 mg/dL   Dg Chest 2 View  4/78/2956   *RADIOLOGY REPORT*  Clinical Data: Right arm weakness.  Stroke.  CHEST - 2 VIEW  Comparison: 07/28/2012 and 07/24/2012 radiographs.  Findings: The heart size is stable status post CABG.  The pleural effusions and bibasilar atelectasis have improved.  There is no edema or pneumothorax.  The osseous structures appear unchanged.  IMPRESSION: Improving basilar aeration and pleural effusion status post CABG.   Original Report Authenticated By: Carey Bullocks, M.D.   Mr Brain Wo Contrast  08/14/2012   *RADIOLOGY REPORT*  Clinical Data:  Right-sided weakness.  Stroke risk factors include hypertension, hyperlipidemia, prior myocardial infarction, and tobacco abuse.  MRI HEAD WITHOUT CONTRAST MRA HEAD WITHOUT CONTRAST  Technique:  Multiplanar, multiecho pulse sequences of the brain and surrounding structures were obtained without intravenous contrast. Angiographic images of the head were obtained using MRA technique without contrast.  Comparison:  CT head 08/13/2012.  MRI HEAD  Findings:  Large acute left hemisphere infarct affects predominately  PCA territory including the posterior temporal lobe, much of the occipital lobe, portions of the parietal lobe, and much of the thalamus and posterior limb internal capsule, all on the left. There is no acute or subacute hemorrhage.  There is no midline shift.  There is a remote left parietal cortical and subcortical infarct with associated gliosis and encephalomalacia.  This is associated with slight chronic gyral microhemorrhage.  This  infarct lies within the left MCA distribution.  There is mild atrophy.  There is mild chronic microvascular ischemic change.  Normal pituitary.    Negative osseous structures. Extracranial soft tissues unremarkable.  IMPRESSION: Acute non hemorrhagic left PCA territory infarct affecting most of that vessel's vascular territory. Chronic left parietal MCA territory infarct.  MRA HEAD  Findings: Significant motion degradation.  Gross patency of the carotid arteries is established.  The basilar artery is patent with left vertebral dominant. The left vertebral is occluded except for its distal most segment.  This could fill via retrograde flow from the basilar.  There is no appreciable flow related enhancement of the right vertebral at the skull base.  There is no proximal flow limiting stenosis of the middle cerebral arteries.  Focal narrowing at the origin of the right anterior cerebral artery could be 75%. Severe disease bilateral PCA vessels, greater on the left. Moderate irregularity distal MCA vessels bilaterally.  IMPRESSION: Motion degraded scan.  Severe disease distal left PCA.  Basilar artery grossly patent. Abnormal distal right vertebral.   Original Report Authenticated By: Davonna Belling, M.D.   Mr Mra Head/brain Wo Cm  08/14/2012   *RADIOLOGY REPORT*  Clinical Data:  Right-sided weakness.  Stroke risk factors include hypertension, hyperlipidemia, prior myocardial infarction, and tobacco abuse.  MRI HEAD WITHOUT CONTRAST MRA HEAD WITHOUT CONTRAST  Technique:  Multiplanar, multiecho pulse sequences of the brain and surrounding structures were obtained without intravenous contrast. Angiographic images of the head were obtained using MRA technique without contrast.  Comparison:  CT head 08/13/2012.  MRI HEAD  Findings:  Large acute left hemisphere  infarct affects predominately  PCA territory including the posterior temporal lobe, much of the occipital lobe, portions of the parietal lobe, and much of the  thalamus and posterior limb internal capsule, all on the left. There is no acute or subacute hemorrhage.  There is no midline shift.  There is a remote left parietal cortical and subcortical infarct with associated gliosis and encephalomalacia.  This is associated with slight chronic gyral microhemorrhage.  This infarct lies within the left MCA distribution.  There is mild atrophy.  There is mild chronic microvascular ischemic change.  Normal pituitary.    Negative osseous structures. Extracranial soft tissues unremarkable.  IMPRESSION: Acute non hemorrhagic left PCA territory infarct affecting most of that vessel's vascular territory. Chronic left parietal MCA territory infarct.  MRA HEAD  Findings: Significant motion degradation.  Gross patency of the carotid arteries is established.  The basilar artery is patent with left vertebral dominant. The left vertebral is occluded except for its distal most segment.  This could fill via retrograde flow from the basilar.  There is no appreciable flow related enhancement of the right vertebral at the skull base.  There is no proximal flow limiting stenosis of the middle cerebral arteries.  Focal narrowing at the origin of the right anterior cerebral artery could be 75%. Severe disease bilateral PCA vessels, greater on the left. Moderate irregularity distal MCA vessels bilaterally.  IMPRESSION: Motion degraded scan.  Severe disease distal left PCA.  Basilar artery grossly patent. Abnormal distal right vertebral.   Original Report Authenticated By: Davonna Belling, M.D.    Assessment/Plan: Diagnosis: left PCA infarct 1. Does the need for close, 24 hr/day medical supervision in concert with the patient's rehab needs make it unreasonable for this patient to be served in a less intensive setting? Yes 2. Co-Morbidities requiring supervision/potential complications: htn, cad s/p cabg 3. Due to bladder management, bowel management, safety, skin/wound care, disease management,  medication administration, pain management and patient education, does the patient require 24 hr/day rehab nursing? Yes 4. Does the patient require coordinated care of a physician, rehab nurse, PT (1-2 hrs/day, 5 days/week), OT (1-2 hrs/day, 5 days/week) and SLP (1 hrs/day, 5 days/week) to address physical and functional deficits in the context of the above medical diagnosis(es)? Yes Addressing deficits in the following areas: balance, endurance, locomotion, strength, transferring, bowel/bladder control, bathing, dressing, feeding, grooming, toileting, cognition and psychosocial support 5. Can the patient actively participate in an intensive therapy program of at least 3 hrs of therapy per day at least 5 days per week? Yes 6. The potential for patient to make measurable gains while on inpatient rehab is excellent 7. Anticipated functional outcomes upon discharge from inpatient rehab are supervision to min assist with PT, supervision to minimal assist with OT, supervision with SLP. 8. Estimated rehab length of stay to reach the above functional goals is: 2 weeks 9. Does the patient have adequate social supports to accommodate these discharge functional goals? Yes potentially 10. Anticipated D/C setting: Home 11. Anticipated post D/C treatments: HH therapy to outpt therapies 12. Overall Rehab/Functional Prognosis: excellent  RECOMMENDATIONS: This patient's condition is appropriate for continued rehabilitative care in the following setting: CIR Patient has agreed to participate in recommended program. Yes Note that insurance prior authorization may be required for reimbursement for recommended care.  Comment: Rehab RN to follow up.   Ranelle Oyster, MD, [place] [facility]     08/14/2012

## 2012-08-14 NOTE — Progress Notes (Signed)
Stroke Team Progress Note  HISTORY [name] [name] is a 62 y.o. female who has been staying with her sister recently while [name] [name] recovers from coronary artery bypass graft surgery 07/25/2012. She apparently awoke this morning 08/13/2012 at approximately 4 AM and was noted to be confused with rambling speech. She was brought to the emergency department at [facility] today by family members for further evaluation.The patient was noted to be having difficulty moving her right side. A CT scan of the head is currently pending; although, the patient is outside the window for TPA. The fact that she recently had coronary artery bypass graft surgery also excludes her from TPA consideration. The patient was taking aspirin 325 mg daily prior to admission. This will be continued. She will be admitted for further evaluation and treatment. Her NIH score was felt to be a 7. Her modified Rankin score was believed to be a 1.  She was admitted for further evaluation and treatment.  SUBJECTIVE Her sister is at the bedside.  Overall she feels her condition is gradually improving.   OBJECTIVE Most recent Vital Signs: Filed Vitals:   08/14/12 0253 08/14/12 0500 08/14/12 0643 08/14/12 1000  BP: 151/68 142/65 150/67 153/75  Pulse: 64 111 61 64  Temp: 98.5 F (36.9 C) 98.6 F (37 C) 98.7 F (37.1 C) 98.4 F (36.9 C)  TempSrc:    Oral  Resp: 18 18 16 17   Height:      Weight:      SpO2: 94% 92% 100% 100%   CBG (last 3)   Recent Labs  08/13/12 1525  GLUCAP 97    IV Fluid Intake:   . sodium chloride 1,000 mL (08/13/12 2015)    MEDICATIONS  . aspirin  300 mg Rectal Daily   Or  . aspirin  325 mg Oral Daily  . atorvastatin  80 mg Oral q1800  . cloNIDine  0.2 mg Oral BID  . gadobenate dimeglumine  20 mL Intravenous Once  . heparin  5,000 Units Subcutaneous Q8H  . lisinopril  10 mg Oral Daily  . metoprolol  50 mg Oral BID   PRN:  senna-docusate  Diet:  General thin liquids Activity:  OOB  with assistance DVT Prophylaxis:  Heparin 5000 units sq tid   CLINICALLY SIGNIFICANT STUDIES Basic Metabolic Panel:   Recent Labs Lab 08/13/12 1620  NA 136  K 3.6  CL 96  CO2 25  GLUCOSE 105*  BUN 10  CREATININE 0.63  CALCIUM 9.9   Liver Function Tests:   Recent Labs Lab 08/13/12 1620  AST 19  ALT 23  ALKPHOS 77  BILITOT 0.6  PROT 7.5  ALBUMIN 3.3*   CBC:   Recent Labs Lab 08/13/12 1620  WBC 8.8  NEUTROABS 6.8  HGB 11.5*  HCT 34.1*  MCV 93.2  PLT 427*   Coagulation:   Recent Labs Lab 08/13/12 1620  LABPROT 14.0  INR 1.10   Cardiac Enzymes:   Recent Labs Lab 08/13/12 1620  TROPONINI <0.30   Urinalysis: No results found for this basename: COLORURINE, APPERANCEUR, LABSPEC, PHURINE, GLUCOSEU, HGBUR, BILIRUBINUR, KETONESUR, PROTEINUR, UROBILINOGEN, NITRITE, LEUKOCYTESUR,  in the last 168 hours Lipid Panel    Component Value Date/Time   CHOL 90 08/14/2012 0635   TRIG 72 08/14/2012 0635   HDL 22* 08/14/2012 0635   CHOLHDL 4.1 08/14/2012 0635   VLDL 14 08/14/2012 0635   LDLCALC 54 08/14/2012 0635   HgbA1C  Lab Results  Component Value Date  HGBA1C 5.4 08/14/2012    Urine Drug Screen:   No results found for this basename: labopia,  cocainscrnur,  labbenz,  amphetmu,  thcu,  labbarb    Alcohol Level: No results found for this basename: ETH,  in the last 168 hours  CT of the brain  08/13/2012  MRI of the brain  08/14/2012    Acute non hemorrhagic left PCA territory infarct affecting most of that vessel's vascular territory. Chronic left parietal MCA territory infarct.   MRA of the brain  08/14/2012    Motion degraded scan.  Severe disease distal left PCA.  Basilar artery grossly patent. Abnormal distal right vertebral.   2D Echocardiogram  EF 55-60% with no source of embolus.   Carotid Doppler  No evidence of hemodynamically significant internal carotid artery stenosis. Vertebral artery flow is antegrade.   CXR  08/14/2012    Improving basilar  aeration and pleural effusion status post CABG.   EKG  normal sinus rhythm.   Therapy Recommendations   Physical Exam   Pleasant middle-aged African American lady currently not in distress.Awake alert. Afebrile. Head is nontraumatic. Neck is supple without bruit. Hearing is normal. Cardiac exam no murmur or gallop. Lungs are clear to auscultation. Distal pulses are well felt. Neurological Exam ; Awake  Alert oriented x 3. Normal speech and language.eye movements full without nystagmus.fundi were not visualized. Vision acuity  appeasr normal dense right sided homonymous hemianopsia to bedside testing. Diminished recall 1/3. Hearing is normal. Palatal movements are normal. Face symmetric. Tongue midline. Normal strength, tone, reflexes and coordination. Normal sensation. Gait deferred. ASSESSMENT [name] [name] is a 62 y.o. female presenting with difficulty moving her right side.  Imaging confirms a left PCA territory infarct. Infarct felt to be embolic secondary to unknown source.  On aspirin 325 mg orally every day prior to admission. Now on aspirin 325 mg orally every day for secondary stroke prevention. Patient with resultant right hemiparesis. Work up underway.  Hypertension Hyperlipidemia, LDL 54, on lipitor 80 PTA, now on lipitor 80, goal LDL < 100 STEMI 6/20 w/ CABG 07/28/2012 Tobacco abuse  Hospital day # 1  TREATMENT/PLAN  Continue aspirin 325 mg orally every day for secondary stroke prevention. TEE to look for embolic source. Followed by Dr. Allyson Sabal at [facility] as an outpatient. They have no one available to do the procedure tomorrow. I then arranged with [facility] for tomorrow.  If positive for PFO (patent foramen ovale), check bilateral lower extremity venous dopplers to rule out DVT as possible source of stroke. If TEE negative, [place] cardiologist will place implantable loop recorder to evaluate for atrial fibrillation as etiology of stroke. This has been  explained to patient/family by Dr. Pearlean Brownie and they are agreeable. Patient ok for discharge from neuro standpoint after the procedure(s) tomorrow Therapy evals; rehab consult  Annie Main, MSN, RN, ANVP-BC, ANP-BC, [facility] Pager: [phone] 08/14/2012 11:41 AM  I have personally obtained a history, examined the patient, evaluated imaging results, and formulated the assessment and plan of care. I agree with the above. Delia Heady, MD

## 2012-08-14 NOTE — Evaluation (Signed)
Speech Language Pathology Evaluation Patient Details Name: [name] [name] MRN: [id number] DOB: [birth year] Today's Date: 08/14/2012 Time: 5621-3086 SLP Time Calculation (min): 23 min  Problem List:  Patient Active Problem List   Diagnosis Date Noted  . Acute CVA (cerebrovascular accident) 08/13/2012  . 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 07/25/12 07/28/2012  . Hyperlipidemia LDL goal < 70 07/24/2012  . STEMI (ST elevation myocardial infarction), secondary to disease of RCA, with disease of LAD and diag. branch and chronic non dominant LCX occlusion for CABG  07/21/2012  . Essential hypertension 07/21/2012  . Tobacco abuse 07/21/2012   Past Medical History:  Past Medical History  Diagnosis Date  . Hypertension   . Hyperlipidemia LDL goal < 70 07/24/2012  . STEMI (ST elevation myocardial infarction), secondary to disease of RCA, with disease of LAD and diag. branch and chronic non dominant LCX occlusion for CABG  07/21/2012  . 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 07/25/12 07/28/2012    POSTOPERATIVE DIAGNOSIS: Severe 3-vessel coronary disease, status post  myocardial infarction.  PROCEDURE: Median sternotomy, extracorporeal circulation, coronary  artery bypass grafting x5 (left internal mammary artery to LAD,  sequential saphenous vein graft to obtuse marginal 1, obtuse marginal 2,  and posterior descending with Y graft to 1st diagonal), endoscopic vein  harvest, left thigh.    . Tobacco abuse 07/21/2012   Past Surgical History:  Past Surgical History  Procedure Laterality Date  . Coronary artery bypass graft N/A 07/25/2012    Procedure: CORONARY ARTERY BYPASS GRAFTING (CABG) times five with Left  Endoscopic Saphenous Vein Harvest and Left Internal  Mammary ;  Surgeon: Loreli Slot, MD;  Location: [facility] [place];  Service: Open Heart Surgery;  Laterality: N/A;   HPI:  62 y.o. female recovering from coronary artery bypass graft surgery performed  approximately 2 weeks ago now presenting with confusion, word finding difficulties, decreased sensation of the right upper extremity and face, and coordination problems with the right upper extremity. MRI shows Large acute left hemisphere infarct affects predominately PCA territory including the posterior temporal lobe, much of the occipital lobe, portions of the parietal lobe, and much of the thalamus and posterior limb internal capsule   Assessment / Plan / Recommendation Clinical Impression  Pt presents with aphasia with moderate receptive and expressive deficits. She is able to follow very basic, concrete language/commands and responds well to visual cues and gestures to reinforce comprehension. Her verbal expression is dominated with anomia leading to empty speech but also perseveration and semantic paraphasias. With phonemic and contextual/completion cues, pt able to find word. There is also a moderate right neglect which impacts functional problem solving and awareness; again pt able to look right with moderate cues. Pt likely to respond well to therapeutic intervention. Will need SLP therapy in acute setting for facilitating expression of wants and needs and providing further pt/caregiver education. Pt would benefit from CIR consult.     SLP Assessment  Patient needs continued Speech Lanaguage Pathology Services    Follow Up Recommendations  Inpatient Rehab    Frequency and Duration min 2x/week  2 weeks   Pertinent Vitals/Pain NA   SLP Goals  SLP Goals Potential to Achieve Goals: Good Progress/Goals/Alternative treatment plan discussed with pt/caregiver and they: Agree SLP Goal #1: Pt will name familar items during functional task with min verbal cues x10.  SLP Goal #2: Pt will scan right as needed during functional task with moderate verbal and visual cues  SLP Goal #3:  Pt will make requests for basic wants needs x3 during basic functional task with min verbal cues.   SLP  Evaluation Prior Functioning  Cognitive/Linguistic Baseline: Within functional limits   Cognition  Overall Cognitive Status: Difficult to assess (due to language defictis. ) Arousal/Alertness: Awake/alert Orientation Level: Oriented to person;Oriented to place;Disoriented to time;Disoriented to situation Attention: Focused;Sustained;Selective Focused Attention: Appears intact Sustained Attention: Appears intact Selective Attention: Appears intact Memory:  (UTA) Awareness: Impaired Awareness Impairment: Intellectual impairment;Emergent impairment Problem Solving: Impaired Problem Solving Impairment: Verbal basic;Functional basic    Comprehension  Auditory Comprehension Overall Auditory Comprehension: Impaired Yes/No Questions: Impaired Basic Biographical Questions: 51-75% accurate Basic Immediate Environment Questions: 25-49% accurate Commands: Impaired One Step Basic Commands: 50-74% accurate Conversation: Simple Visual Recognition/Discrimination Discrimination: Exceptions to [facility] Common Objects: Able in field of 2 Black/White Line Drawings: Able in field of 2 Reading Comprehension Reading Status: Impaired Word level: Impaired Functional Environmental (signs, name badge):  (pt name) Interfering Components: Right neglect/inattention    Expression Verbal Expression Overall Verbal Expression: Impaired Initiation: No impairment Automatic Speech: Name;Social Response;Day of week Level of Generative/Spontaneous Verbalization: Phrase Repetition: No impairment Naming: Impairment Responsive: 0-25% accurate Confrontation: Impaired Common Objects: Able in field of 2 Black/White Line Drawings: Able in field of 2 Convergent: Not tested Divergent: Not tested Verbal Errors: Semantic paraphasias;Perseveration;Aware of errors Pragmatics: No impairment Effective Techniques: Semantic cues;Sentence completion;Phonemic cues Written Expression Dominant Hand: Right   Oral / Motor Oral  Motor/Sensory Function Overall Oral Motor/Sensory Function: Appears within functional limits for tasks assessed Motor Speech Overall Motor Speech: Appears within functional limits for tasks assessed   GO    Harlon Ditty, MA CCC-SLP [phone]  [name] [name] 08/14/2012, 11:08 AM

## 2012-08-14 NOTE — Progress Notes (Signed)
VASCULAR LAB PRELIMINARY  PRELIMINARY  PRELIMINARY  PRELIMINARY  Carotid duplex completed.    Preliminary report:  Bilateral:  Less than 39% ICA stenosis.  Vertebral artery flow is antegrade.      Courtlyn Aki, RVT 08/14/2012, 9:39 AM     

## 2012-08-14 NOTE — Progress Notes (Signed)
I agree with the following treatment note after reviewing documentation.   Johnston, Juanetta Negash Brynn   OTR/L Pager: [phone] Office: [phone] .   

## 2012-08-14 NOTE — Evaluation (Signed)
Physical Therapy Evaluation Patient Details Name: [name] [name] MRN: [id number] DOB: 1951/01/30 Today's Date: 08/14/2012 Time: 7829-5621 PT Time Calculation (min): 26 min  PT Assessment / Plan / Recommendation History of Present Illness  62 y.o. female recovering from coronary artery bypass graft surgery performed approximately 2 weeks ago now presenting with confusion, word finding difficulties, decreased sensation of the right upper extremity and face, and coordination problems with the right upper extremity. MRI shows Large acute left hemisphere infarct affects predominately PCA territory including the posterior temporal lobe, much of the occipital lobe, portions of the parietal lobe, and much of the thalamus and posterior limb internal capsule  Clinical Impression  Presents with cognitive deficits, right inattention and expressive aphasia. Pt demo decreased awareness of deficits. (see below problem list for further information and deficits). Pt would benefit from acute skilled PT to maximize functional mobility and increase safety and independence.  Pt would be a great candidate for CIR and has a supportive family who is open to CIR option.     PT Assessment  Patient needs continued PT services    Follow Up Recommendations  CIR    Does the patient have the potential to tolerate intense rehabilitation      Barriers to Discharge        Equipment Recommendations  Other (comment) (TBD )    Recommendations for Other Services Rehab consult   Frequency Min 4X/week    Precautions / Restrictions Precautions Precautions: Sternal;Fall Precaution Comments: s/p CABG 2 weeks prior  Restrictions Weight Bearing Restrictions: No   Pertinent Vitals/Pain No pain reported. See vitals.       Mobility  Bed Mobility Bed Mobility: Not assessed Supine to Sit: 3: Mod assist;HOB elevated;With rails Sitting - Scoot to Edge of Bed: 3: Mod assist;With rail Details for Bed Mobility Assistance: (A)  needed to bring pt to upright sitting position on EOB; required HOB elevated and multimodal cues for hand placement and sequencing Transfers Transfers: Sit to Stand;Stand to Sit Sit to Stand: 3: Mod assist;From elevated surface;With upper extremity assist;From chair/3-in-1;From toilet Stand to Sit: 3: Mod assist;With upper extremity assist;To chair/3-in-1;To toilet;With armrests Details for Transfer Assistance: pt requires (A) to complete transfer to upright standing position; max cues for safety and hand placement with RW; pt has difficulty WB through R LE to push up from seated position; pt can be impulsive at times and demo decreased safety awareness with transfers. Pt also ambulated hand held assist with left lean into OTS with max vc's to stand straight. Ambulation/Gait Ambulation/Gait Assistance: 3: Mod assist Ambulation Distance (Feet): 10 Feet (x2) Assistive device: Rolling walker Ambulation/Gait Assistance Details: requires (A) to facilitate grasp of R UE onto RW: without (A) pt will not use R UE on RW and pushes RW with L UE; pt tends to drag R LE through swing phase and requires (A) to maintain balance; pt unaware that she amb with lean to Left and was instructed continuously to find midline Gait Pattern: Decreased weight shift to right;Decreased hip/knee flexion - right;Decreased stride length;Decreased stance time - left;Trunk flexed (R LE ER constantly ) Gait velocity: decreased Stairs: No Wheelchair Mobility Wheelchair Mobility: No Modified Rankin (Stroke Patients Only) Pre-Morbid Rankin Score: No symptoms Modified Rankin: Moderate disability         PT Diagnosis: Difficulty walking;Generalized weakness  PT Problem List: Decreased range of motion;Decreased strength;Decreased activity tolerance;Decreased balance;Decreased mobility;Decreased knowledge of use of DME;Decreased safety awareness;Decreased cognition PT Treatment Interventions: DME instruction;Gait training;Functional  mobility training;Therapeutic activities;Therapeutic  exercise;Balance training;Neuromuscular re-education;Patient/family education     PT Goals(Current goals can be found in the care plan section) Acute Rehab PT Goals Patient Stated Goal: none stated PT Goal Formulation: With patient/family Time For Goal Achievement: 08/21/12 Potential to Achieve Goals: Good  Visit Information  Last PT Received On: 08/14/12 Assistance Needed: +2 (for safety) History of Present Illness: 62 y.o. female recovering from coronary artery bypass graft surgery performed approximately 2 weeks ago now presenting with confusion, word finding difficulties, decreased sensation of the right upper extremity and face, and coordination problems with the right upper extremity. MRI shows Large acute left hemisphere infarct affects predominately PCA territory including the posterior temporal lobe, much of the occipital lobe, portions of the parietal lobe, and much of the thalamus and posterior limb internal capsule       Prior Functioning  Home Living Family/patient expects to be discharged to:: Private residence Living Arrangements: Parent;Other (Comment) (since CABG) Available Help at Discharge: Family;Available 24 hours/day;Other (Comment) Type of Home: House Home Access: Stairs to enter [employer] of Steps: 4-5 Entrance Stairs-Rails: None Home Layout: One level Home Equipment: Walker - 2 wheels;Cane - single point Prior Function Level of Independence: Needs assistance Gait / Transfers Assistance Needed: since CABG pt has progressed to amb without AD; per Sister  ADL's / Homemaking Assistance Needed: mother cooks and cleans for pt Communication / Swallowing Assistance Needed: word finding problems- expressive aphasia Comments: pt was independent and lived alone prior to CABG; has been living with mother since; was amb without SPC or RW in the last few days prior to  hospitalization Communication Communication: Receptive difficulties;Expressive difficulties Dominant Hand: Right    Cognition  Cognition Arousal/Alertness: Awake/alert Behavior During Therapy: Impulsive Overall Cognitive Status: Impaired/Different from baseline Area of Impairment: Orientation;Attention;Memory;Following commands;Safety/judgement;Awareness;Problem solving Orientation Level: Disoriented to;Place;Situation Current Attention Level: Selective Memory: Decreased recall of precautions Following Commands: Follows one step commands inconsistently Safety/Judgement: Decreased awareness of safety;Decreased awareness of deficits Awareness: Intellectual;Anticipatory;Emergent Problem Solving: Slow processing;Decreased initiation;Difficulty sequencing;Requires verbal cues;Requires tactile cues General Comments: pt is a poor historian, is aphasic, perseverates on certain phrases and questions during session; sister present to give baseline cognition and PLOF     Extremity/Trunk Assessment Upper Extremity Assessment Upper Extremity Assessment: RUE deficits/detail;Difficult to assess due to impaired cognition RUE Deficits / Details: Pt with ataxic movement and inattention to RUE. RUE Sensation: decreased proprioception;decreased light touch RUE Coordination: decreased fine motor;decreased gross motor Lower Extremity Assessment Lower Extremity Assessment: Defer to PT evaluation RLE Deficits / Details: pt with ataxic movement of R LE and UE; pt was able to perform LAQ with R LE; unable to MMT due to decreased cognition RLE Sensation: decreased light touch;decreased proprioception Cervical / Trunk Assessment Cervical / Trunk Assessment: Kyphotic   Balance Balance Balance Assessed: Yes Static Sitting Balance Static Sitting - Balance Support: Bilateral upper extremity supported;Feet supported Static Sitting - Level of Assistance: 5: Stand by assistance Static Standing Balance Static  Standing - Balance Support: Bilateral upper extremity supported;During functional activity Static Standing - Level of Assistance: 4: Min assist Static Standing - Comment/# of Minutes: pt required (A) to maintain balance while performing perineal hygiene  End of Session PT - End of Session Equipment Utilized During Treatment: Gait belt Activity Tolerance: Patient tolerated treatment well Patient left: in chair;with call bell/phone within reach;with nursing/sitter in room Nurse Communication: Mobility status;Other (comment) (sister verbalized understanding of not leaving pt alone )  GP     Shelva Majestic Carney, [place] [phone] 08/14/2012, 4:01  PM   

## 2012-08-14 NOTE — Progress Notes (Signed)
Paged MD of change in NIH score from 7 to 11 when arrival to floor from ED. Most likely difference in opinion on assessment. No change in pt status since arrival to floor. Continuing to monitor. Passed on to oncoming nurse.

## 2012-08-14 NOTE — Progress Notes (Signed)
Echo Lab  2D Echocardiogram completed.  [name] [name] L Shatyra Becka, RDCS 08/14/2012 9:09 AM

## 2012-08-14 NOTE — Progress Notes (Signed)
   CARE MANAGEMENT NOTE 08/14/2012  Patient:  [name] [name]   Account Number:  [id number]  Date Initiated:  08/14/2012  Documentation initiated by:  Jiles Crocker  Subjective/Objective Assessment:   ADMITTED WITH CVA     Action/Plan:   RECENTLY DISCHARGED/ MI/ CABG 6/27; INFORMATION GIVEN FOR PCP AND PRESCRIPTION DISCOUNT CARD GIVEN LAST ADMISSION; IS ACTIVE WITH [facility]/ HAS ROLLING WALKER; CM FOLLOWING FOR DCP   Anticipated DC Date:  08/21/2012   Anticipated DC Plan:  HOME W HOME HEALTH SERVICES  In-house referral  Financial Counselor      DC Planning Services  CM consult         Status of service:  In process, will continue to follow Medicare Important Message given?  NA - LOS <3 / Initial given by admissions (If response is "NO", the following Medicare IM given date fields will be blank)  Per UR Regulation:  Reviewed for med. necessity/level of care/duration of stay Comments:  08/14/2012- [name] Cayden Granholm RN,BSN,MHA

## 2012-08-14 NOTE — Progress Notes (Signed)
Occupational Therapy Evaluation Patient Details Name: [name] [name] MRN: [id number] DOB: Jan 07, 1951 Today's Date: 08/14/2012 Time: 4098-1191 OT Time Calculation (min): 29 min  OT Assessment / Plan / Recommendation History of present illness 62 y.o. female recovering from coronary artery bypass graft surgery performed approximately 2 weeks ago now presenting with confusion, word finding difficulties, decreased sensation of the right upper extremity and face, and coordination problems with the right upper extremity. MRI shows Large acute left hemisphere infarct affects predominately PCA territory including the posterior temporal lobe, much of the occipital lobe, portions of the parietal lobe, and much of the thalamus and posterior limb internal capsule   Clinical Impression   PTA Pt independent in ADL and mobility. Pt with cognitive deficits, right inattention, and expressive aphasia. Pt unaware of deficits. (see below problem list for further information and deficits) Pt will benefit from skilled OT to incr their safety and and independence with ADL and functional mobility for ADL. Pt is excellent CIR candidate and family receptive to CIR.    OT Assessment  Patient needs continued OT Services    Follow Up Recommendations  CIR       Equipment Recommendations  Other (comment) (TBD)    Recommendations for Other Services Rehab consult  Frequency  Min 3X/week    Precautions / Restrictions Precautions Precautions: Sternal;Fall Precaution Comments: s/p CABG 2 weeks prior  Restrictions Weight Bearing Restrictions: No   Pertinent Vitals/Pain Pt did not report any pain during session.    ADL  Eating/Feeding: Maximal assistance Where Assessed - Eating/Feeding: Chair Grooming: Maximal assistance;Wash/dry hands;Wash/dry face;Teeth care Where Assessed - Grooming: Supported sitting Upper Body Bathing: Maximal assistance Where Assessed - Upper Body Bathing: Supported sitting Lower Body  Bathing: +1 Total assistance Where Assessed - Lower Body Bathing: Supported sitting Upper Body Dressing: Maximal assistance Where Assessed - Upper Body Dressing: Supported sitting Lower Body Dressing: Maximal assistance Where Assessed - Lower Body Dressing: Supported sit to Pharmacist, hospital: Moderate assistance Toilet Transfer Method: Sit to stand Toilet Transfer Equipment: Comfort height toilet;Grab bars Toileting - Clothing Manipulation and Hygiene: Maximal assistance Where Assessed - Glass blower/designer Manipulation and Hygiene: Standing Tub/Shower Transfer: Maximal assistance Tub/Shower Transfer Method: Stand pivot Equipment Used: Gait belt;Rolling walker Transfers/Ambulation Related to ADLs: Pt needed max tactile and vc's for transfers and ambulation. Pt unable to keep RUE on RW, and performed better with hand held assist. Pt with heavy lean/push to the left when standing without RW. Required tactile cues to correct and stand up. ADL Comments: Pt unable to follow directions. When told to pick up LUE, Pt continued to pick up LLE with unawareness that she was not following directions correctly. Pt repeated instructions one word at a time and Pt still unable to follow, or aware of deficits. Pt able to recognize toothpaste, and demonstrate how to use it, but unable to say what it was. Pt able to group toothpaste and toothbrush, and distinguish that brush was different, but she could not name any objects. After using the toilet, Pt was not able to verbalize what should happen next. When given toilet paper, she started dabbing her face. After max cueing Pt able to perform posterior peri care. Pt sitting at sink for hand washing and brushing teeth. Pt able to complete BUE hand washing with max cues to include RUE. Once task was started, RUE was incoorporated in task. Pt with severe anterior trunk lean over sink.     OT Diagnosis: Generalized weakness;Cognitive deficits;Hemiplegia dominant  side;Ataxia  OT  Problem List: Decreased strength;Decreased range of motion;Impaired balance (sitting and/or standing);Impaired vision/perception;Decreased coordination;Decreased cognition;Decreased safety awareness;Decreased knowledge of use of DME or AE;Decreased knowledge of precautions;Impaired sensation;Impaired UE functional use OT Treatment Interventions: Self-care/ADL training;Therapeutic exercise;DME and/or AE instruction;Therapeutic activities;Cognitive remediation/compensation;Visual/perceptual remediation/compensation;Patient/family education;Balance training   OT Goals(Current goals can be found in the care plan section) Acute Rehab OT Goals Patient Stated Goal: none stated OT Goal Formulation: With patient/family Time For Goal Achievement: 08/28/12 Potential to Achieve Goals: Good ADL Goals Pt Will Perform Grooming: with min guard assist;standing Pt Will Perform Upper Body Dressing: with min guard assist;sitting Pt Will Perform Lower Body Dressing: with min guard assist;sit to/from stand Pt Will Transfer to Toilet: with min guard assist;regular height toilet Pt Will Perform Toileting - Clothing Manipulation and hygiene: with supervision;sit to/from stand  Visit Information  Last OT Received On: 08/14/12 Assistance Needed: +2 (for safety) History of Present Illness: 62 y.o. female recovering from coronary artery bypass graft surgery performed approximately 2 weeks ago now presenting with confusion, word finding difficulties, decreased sensation of the right upper extremity and face, and coordination problems with the right upper extremity. MRI shows Large acute left hemisphere infarct affects predominately PCA territory including the posterior temporal lobe, much of the occipital lobe, portions of the parietal lobe, and much of the thalamus and posterior limb internal capsule       Prior Functioning     Home Living Family/patient expects to be discharged to:: Private  residence Living Arrangements: Parent;Other (Comment) (since CABG) Available Help at Discharge: Family;Available 24 hours/day;Other (Comment) Type of Home: House Home Access: Stairs to enter [employer] of Steps: 4-5 Entrance Stairs-Rails: None Home Layout: One level Home Equipment: Walker - 2 wheels;Cane - single point Prior Function Level of Independence: Needs assistance Gait / Transfers Assistance Needed: since CABG pt has progressed to amb without AD; per Sister  ADL's / Homemaking Assistance Needed: mother cooks and cleans for pt Communication / Swallowing Assistance Needed: word finding problems- expressive aphasia Comments: pt was independent and lived alone prior to CABG; has been living with mother since; was amb without SPC or RW in the last few days prior to hospitalization Communication Communication: Receptive difficulties;Expressive difficulties Dominant Hand: Right            Cognition  Cognition Arousal/Alertness: Awake/alert Behavior During Therapy: Impulsive Overall Cognitive Status: Impaired/Different from baseline Area of Impairment: Orientation;Attention;Memory;Following commands;Safety/judgement;Awareness;Problem solving Orientation Level: Disoriented to;Place;Situation Current Attention Level: Selective Memory: Decreased recall of precautions Following Commands: Follows one step commands inconsistently Safety/Judgement: Decreased awareness of safety;Decreased awareness of deficits Awareness: Intellectual;Anticipatory;Emergent Problem Solving: Slow processing;Decreased initiation;Difficulty sequencing;Requires verbal cues;Requires tactile cues General Comments: pt is a poor historian, is aphasic, perseverates on certain phrases and questions during session; sister present to give baseline cognition and PLOF     Extremity/Trunk Assessment Upper Extremity Assessment Upper Extremity Assessment: RUE deficits/detail;Difficult to assess due to  impaired cognition RUE Deficits / Details: Pt with ataxic movement and inattention to RUE. RUE Sensation: decreased proprioception;decreased light touch RUE Coordination: decreased fine motor;decreased gross motor Lower Extremity Assessment Lower Extremity Assessment: Defer to PT evaluation RLE Deficits / Details: pt was able to perform LAQ with R LE; unable to MMT due to decreased cognition RLE Sensation: decreased light touch;decreased proprioception RLE Coordination:  (possible apraxia ) Cervical / Trunk Assessment Cervical / Trunk Assessment: Kyphotic     Mobility Bed Mobility Bed Mobility: Not assessed Supine to Sit: 3: Mod assist;HOB elevated;With rails Sitting - Scoot to Edge of Bed:  3: Mod assist;With rail Details for Bed Mobility Assistance: (A) needed to bring pt to upright sitting position on EOB; required HOB elevated and multimodal cues for hand placement and sequencing Transfers Transfers: Sit to Stand;Stand to Sit Sit to Stand: 3: Mod assist;From elevated surface;With upper extremity assist;From chair/3-in-1;From toilet Stand to Sit: 3: Mod assist;With upper extremity assist;To chair/3-in-1;To toilet;With armrests Details for Transfer Assistance: pt requires (A) to complete transfer to upright standing position; max cues for safety and hand placement with RW; pt has difficulty WB through R LE to push up from seated position; pt can be impulsive at times and demo decreased safety awareness with transfers. Pt also ambulated hand held assist with left lean into OTS with max vc's to stand straight.        Balance Balance Balance Assessed: Yes Static Sitting Balance Static Sitting - Balance Support: Bilateral upper extremity supported;Feet supported Static Sitting - Level of Assistance: 5: Stand by assistance Static Standing Balance Static Standing - Balance Support: Bilateral upper extremity supported;During functional activity Static Standing - Level of Assistance: 4: Min  assist Static Standing - Comment/# of Minutes: pt required (A) to maintain balance while performing perineal hygiene   End of Session OT - End of Session Equipment Utilized During Treatment: Gait belt;Rolling walker Activity Tolerance: Patient tolerated treatment well Patient left: in chair;with call bell/phone within reach;with family/visitor present Nurse Communication: Mobility status;Precautions  GO     Sherryl Manges 08/14/2012, 3:51 PM

## 2012-08-14 NOTE — Progress Notes (Signed)
Rehab Admissions Coordinator Note:  Patient was screened by Clois Dupes for appropriateness for an Inpatient Acute Rehab Consult.  At this time, we are recommending Inpatient Rehab consult.  Clois Dupes 08/14/2012, 3:21 PM  I can be reached at [phone].

## 2012-08-15 ENCOUNTER — Ambulatory Visit: Payer: Self-pay | Admitting: Physician Assistant

## 2012-08-15 ENCOUNTER — Encounter (HOSPITAL_COMMUNITY): Payer: Self-pay

## 2012-08-15 ENCOUNTER — Encounter (HOSPITAL_COMMUNITY)
Admission: EM | Disposition: A | Discharge status: Rehabilitation Facility | Payer: Self-pay | Source: Home / Self Care | Attending: Internal Medicine

## 2012-08-15 DIAGNOSIS — I6350 Cerebral infarction due to unspecified occlusion or stenosis of unspecified cerebral artery: Secondary | ICD-10-CM

## 2012-08-15 DIAGNOSIS — I6789 Other cerebrovascular disease: Secondary | ICD-10-CM

## 2012-08-15 DIAGNOSIS — I6330 Cerebral infarction due to thrombosis of unspecified cerebral artery: Secondary | ICD-10-CM

## 2012-08-15 HISTORY — PX: LOOP RECORDER IMPLANT: SHX5954

## 2012-08-15 HISTORY — PX: TEE WITHOUT CARDIOVERSION: SHX5443

## 2012-08-15 LAB — BASIC METABOLIC PANEL
BUN: 4 mg/dL — ABNORMAL LOW (ref 6–23)
GFR calc Af Amer: >90 mL/min (ref >90)
GFR calc non Af Amer: >90 mL/min (ref >90)
Potassium: 2.8 mEq/L — ABNORMAL LOW (ref 3.5–5.1)
Sodium: 138 mEq/L (ref 135–145)

## 2012-08-15 LAB — AMMONIA: Ammonia: 26 umol/L (ref 11–60)

## 2012-08-15 LAB — VITAMIN B12: Vitamin B-12: 459 pg/mL (ref 211–911)

## 2012-08-15 SURGERY — LOOP RECORDER IMPLANT

## 2012-08-15 SURGERY — ECHOCARDIOGRAM, TRANSESOPHAGEAL
Anesthesia: Moderate Sedation

## 2012-08-15 MED ORDER — LISINOPRIL 20 MG PO TABS
20.0000 mg | ORAL_TABLET | Freq: Every day | ORAL | Status: DC
Start: 2012-08-16 — End: 2012-08-16
  Administered 2012-08-16: 20 mg via ORAL
  Filled 2012-08-15: qty 1

## 2012-08-15 MED ORDER — LIDOCAINE-EPINEPHRINE 1 %-1:100000 IJ SOLN
INTRAMUSCULAR | Status: AC
  Filled 2012-08-15: qty 1

## 2012-08-15 MED ORDER — BUTAMBEN-TETRACAINE-BENZOCAINE 2-2-14 % EX AERO
INHALATION_SPRAY | CUTANEOUS | Status: DC | PRN
  Administered 2012-08-15: 2 via TOPICAL

## 2012-08-15 MED ORDER — FENTANYL CITRATE 0.05 MG/ML IJ SOLN
INTRAMUSCULAR | Status: DC | PRN
  Administered 2012-08-15: 25 ug via INTRAVENOUS

## 2012-08-15 MED ORDER — MIDAZOLAM HCL 10 MG/2ML IJ SOLN
INTRAMUSCULAR | Status: DC | PRN
  Administered 2012-08-15: 1 mg via INTRAVENOUS
  Administered 2012-08-15 (×2): 2 mg via INTRAVENOUS

## 2012-08-15 MED ORDER — SODIUM CHLORIDE 0.9 % IV SOLN: INTRAVENOUS | Status: DC

## 2012-08-15 NOTE — Progress Notes (Signed)
I met with patient at bedside. Discussed inpt rehab admission to assist prior to d/c home. Patient is in agreement. Noted for TEE and implantable loop recorder this afternoon at 4 pm. I will follow up in the morning and plan admission to inpt rehab in am. [phone]

## 2012-08-15 NOTE — CV Procedure (Signed)
SURGEON:  Hillis Range, MD     PREPROCEDURE DIAGNOSIS:  Cryptogenic Stroke    POSTPROCEDURE DIAGNOSIS:  Cryptogenic Stroke     PROCEDURES:   1. Implantable loop recorder implantation    INTRODUCTION:  [name] [name] is a 62 y.o. female with a history of unexplained stroke who presents today for implantable loop implantation.  The patient has had a cryptogenic stroke.  Despite an extensive workup by neurology, no reversible causes have been identified.  she has worn telemetry during which she did not have arrhythmias.  There is significant concern for possible atrial fibrillation as the cause for the patients stroke.  The patient therefore presents today for implantable loop implantation.     DESCRIPTION OF PROCEDURE:  Informed written consent was obtained, and the patient was brought to the electrophysiology lab in a fasting state.  The patient required no sedation for the procedure today.  Mapping over the patient's chest was performed by the EP lab staff to identify the area where electrograms were most prominent for ILR recording.  This area was found to be the left parasternal region over the 3rd-4th intercostal space. The patients left chest was therefore prepped and draped in the usual sterile fashion by the EP lab staff. The skin overlying the left parasternal region was infiltrated with lidocaine for local analgesia.  A 0.5-cm incision was made over the left parasternal region over the 3rd intercostal space.  A subcutaneous ILR pocket was fashioned using a combination of sharp and blunt dissection.  A Medtronic Reveal [place] model [id number] implantable loop recorder was then placed into the pocket  R waves were very prominent and measured 0.84mV.  Steri- Strips and a sterile dressing were then applied.  There were no early apparent complications.     CONCLUSIONS:   1. Successful implantation of a Medtronic Reveal LINQ implantable loop recorder for cryptogenic stroke  2. No early  apparent complications.

## 2012-08-15 NOTE — CV Procedure (Signed)
TEE:  5 mg versed 25 ug fentanyl Indication: CVA   Normal LV EF 60% mild LVH Mild MR Normal AV No ASD Negative bubble study Normal RA Normal ascending aorta and descending thoracic aorta with minimal debris No LAA thrombus  Dr Johney Frame will proceed with implantable loop recorder  Charlton Haws

## 2012-08-15 NOTE — Consult Note (Signed)
  ELECTROPHYSIOLOGY CONSULT NOTE    Patient ID: [name] [name] MRN: [id number], DOB/AGE: 64/14/1952 64 y.o.  Admit date: 08/13/2012 Date of Consult: 08-15-2012  Referring Physician: Sethi  Reason for Consultation: cryptogenic stroke  HPI:  [name] [name] is a 62 year old female who was recently admitted with a STEMI and underwent CABG last month.  She has been staying with her sister since discharge.  On 08-13-2012, she developed confusion with rambling speech and was brought to the ER. She has been found to have a left PCA territory infarct that is felt to be embolic.  TEE is pending today.  Telemetry while hospitalized has demonstrated sinus rhythm with no atrial arrhythmias.   EP has been asked to evaluate for placement of an ILR.   Past Medical History  Diagnosis Date  . Hypertension   . Hyperlipidemia LDL goal < 70 07/24/2012  . STEMI (ST elevation myocardial infarction), secondary to disease of RCA, with disease of LAD and diag. branch and chronic non dominant LCX occlusion for CABG  07/21/2012  . 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 07/25/12 07/28/2012    POSTOPERATIVE DIAGNOSIS: Severe 3-vessel coronary disease, status post  myocardial infarction.  PROCEDURE: Median sternotomy, extracorporeal circulation, coronary  artery bypass grafting x5 (left internal mammary artery to LAD,  sequential saphenous vein graft to obtuse marginal 1, obtuse marginal 2,  and posterior descending with Y graft to 1st diagonal), endoscopic vein  harvest, left thigh.    . Tobacco abuse 07/21/2012     Surgical History:  Past Surgical History  Procedure Laterality Date  . Coronary artery bypass graft N/A 07/25/2012    Procedure: CORONARY ARTERY BYPASS GRAFTING (CABG) times five with Left  Endoscopic Saphenous Vein Harvest and Left Internal  Mammary ;  Surgeon: Steven C Hendrickson, MD;  Location: [facility] OR;  Service: Open Heart Surgery;  Laterality: N/A;     Prescriptions prior to admission    Medication Sig Dispense Refill  . aspirin EC 325 MG tablet Take 325 mg by mouth daily.      . atorvastatin (LIPITOR) 80 MG tablet Take 80 mg by mouth daily.      . cloNIDine (CATAPRES) 0.2 MG tablet Take 0.2 mg by mouth 2 (two) times daily.      . lisinopril (PRINIVIL,ZESTRIL) 10 MG tablet Take 10 mg by mouth daily.      . metoprolol (LOPRESSOR) 50 MG tablet Take 50 mg by mouth 2 (two) times daily.      . traMADol (ULTRAM) 50 MG tablet Take 50-100 mg by mouth every 6 (six) hours as needed for pain.        Inpatient Medications:  . aspirin  300 mg Rectal Daily   Or  . aspirin  325 mg Oral Daily  . atorvastatin  80 mg Oral q1800  . cloNIDine  0.2 mg Oral BID  . gadobenate dimeglumine  20 mL Intravenous Once  . heparin  5,000 Units Subcutaneous Q8H  . lisinopril  10 mg Oral Daily  . metoprolol  50 mg Oral BID    Allergies: No Known Allergies  History   Social History  . Marital Status: Single    Spouse Name: N/A    Number of Children: N/A  . Years of Education: N/A   Occupational History  . Not on file.   Social History Main Topics  . Smoking status: Current Every Day Smoker -- 0.25 packs/day    Types: Cigarettes  . Smokeless tobacco: Not on   file  . Alcohol Use: No  . Drug Use: No  . Sexually Active: Not Currently   Other Topics Concern  . Not on file   Social History Narrative  . No narrative on file     Family History  Problem Relation Age of Onset  . Hypertension Mother   . Stroke Brother     Died at age 39 secondary to MI  . CAD Brother     Physical Exam: Filed Vitals:   08/14/12 1400 08/14/12 2200 08/15/12 0200 08/15/12 0600  BP: 128/72 183/78 152/64 146/63  Pulse: 56 67 57 57  Temp: 99 F (37.2 C) 99.2 F (37.3 C) 98.4 F (36.9 C) 98.9 F (37.2 C)  TempSrc: Oral     Resp: 17 20 20 20  Height:      Weight:      SpO2: 99% 100% 100% 100%    GEN- The patient is well appearing, alert but confused Head- normocephalic, atraumatic Eyes-  Sclera  clear, conjunctiva pink Ears- hearing intact Oropharynx- clear Neck- supple, no JVP Lymph- no cervical lymphadenopathy Lungs- Clear to ausculation bilaterally, normal work of breathing Heart- Regular rate and rhythm, no murmurs, rubs or gallops, PMI not laterally displaced GI- soft, NT, ND, + BS Extremities- no clubbing, cyanosis, or edema  Labs:   Lab Results  Component Value Date   WBC 8.8 08/13/2012   HGB 11.5* 08/13/2012   HCT 34.1* 08/13/2012   MCV 93.2 08/13/2012   PLT 427* 08/13/2012    Recent Labs Lab 08/13/12 1620 08/15/12 0610  NA 136 138  K 3.6 2.8*  CL 96 103  CO2 25 22  BUN 10 4*  CREATININE 0.63 0.55  CALCIUM 9.9 8.8  PROT 7.5  --   BILITOT 0.6  --   ALKPHOS 77  --   ALT 23  --   AST 19  --   GLUCOSE 105* 90   Lab Results  Component Value Date   TROPONINI <0.30 08/14/2012   Lab Results  Component Value Date   CHOL 90 08/14/2012   CHOL 187 07/22/2012   Lab Results  Component Value Date   HDL 22* 08/14/2012   HDL 50 07/22/2012   Lab Results  Component Value Date   LDLCALC 54 08/14/2012   LDLCALC 120* 07/22/2012   Lab Results  Component Value Date   TRIG 72 08/14/2012   TRIG 87 07/22/2012   Lab Results  Component Value Date   CHOLHDL 4.1 08/14/2012   CHOLHDL 3.7 07/22/2012     Radiology/Studies: Dg Chest 2 View 08/14/2012   *RADIOLOGY REPORT*  Clinical Data: Right arm weakness.  Stroke.  CHEST - 2 VIEW  Comparison: 07/28/2012 and 07/24/2012 radiographs.  Findings: The heart size is stable status post CABG.  The pleural effusions and bibasilar atelectasis have improved.  There is no edema or pneumothorax.  The osseous structures appear unchanged.  IMPRESSION: Improving basilar aeration and pleural effusion status post CABG.   Original Report Authenticated By: William Veazey, M.D.   Mr Brain Wo Contrast 08/14/2012   *RADIOLOGY REPORT*  Clinical Data:  Right-sided weakness.  Stroke risk factors include hypertension, hyperlipidemia, prior myocardial  infarction, and tobacco abuse.  MRI HEAD WITHOUT CONTRAST MRA HEAD WITHOUT CONTRAST  Technique:  Multiplanar, multiecho pulse sequences of the brain and surrounding structures were obtained without intravenous contrast. Angiographic images of the head were obtained using MRA technique without contrast.  Comparison:  CT head 08/13/2012.  MRI HEAD  Findings:  Large   acute left hemisphere infarct affects predominately  PCA territory including the posterior temporal lobe, much of the occipital lobe, portions of the parietal lobe, and much of the thalamus and posterior limb internal capsule, all on the left. There is no acute or subacute hemorrhage.  There is no midline shift.  There is a remote left parietal cortical and subcortical infarct with associated gliosis and encephalomalacia.  This is associated with slight chronic gyral microhemorrhage.  This infarct lies within the left MCA distribution.  There is mild atrophy.  There is mild chronic microvascular ischemic change.  Normal pituitary.    Negative osseous structures. Extracranial soft tissues unremarkable.  IMPRESSION: Acute non hemorrhagic left PCA territory infarct affecting most of that vessel's vascular territory. Chronic left parietal MCA territory infarct.  MRA HEAD  Findings: Significant motion degradation.  Gross patency of the carotid arteries is established.  The basilar artery is patent with left vertebral dominant. The left vertebral is occluded except for its distal most segment.  This could fill via retrograde flow from the basilar.  There is no appreciable flow related enhancement of the right vertebral at the skull base.  There is no proximal flow limiting stenosis of the middle cerebral arteries.  Focal narrowing at the origin of the right anterior cerebral artery could be 75%. Severe disease bilateral PCA vessels, greater on the left. Moderate irregularity distal MCA vessels bilaterally.  IMPRESSION: Motion degraded scan.  Severe disease distal  left PCA.  Basilar artery grossly patent. Abnormal distal right vertebral.   Original Report Authenticated By: John Curnes, M.D.   EKG:sinus rhythm with RBBB  TELEMETRY: sinus rhythm with no atrial arrhythmias  Assessment and Plan:  1. Cryptogenic stroke The patient presents with cryptogenic stroke.  The patient has a TEE planned for later today.  I spoke at length with the patient and her sister about monitoring for afib with either a 30 day event monitor or an implantable loop recorder.  Risks, benefits, and alteratives to implantable loop recorder were discussed with the patient today.   At this time, the patient is very clear in their decision to proceed with implantable loop recorder.   Please call with questions.     

## 2012-08-15 NOTE — H&P (Signed)
Physical Medicine and Rehabilitation Admission H&P    Chief Complaint  Patient presents with  . Cerebrovascular Accident  : HPI: [name] [name] is a 62 y.o. right-handed female with history of hypertension, tobacco abuse, coronary artery disease/STEMI with coronary artery bypass grafting and recent discharge after her CABG 07/31/2012. Admitted 08/13/2012 with right-sided weakness and aphasia. MRI of the brain showed acute nonhemorrhagic left PCA territory infarct MRA of the head was severe disease distal left PCA and basilar artery grossly patent. Echocardiogram with ejection fraction of 60% grade 2 diastolic dysfunction. Carotid Dopplers with less than 39% ICA stenosis. TEE 08/15/2012 showed ejection fraction 60% without thrombus. Followup cardiology services for monitoring of atrial fibrillation in relation to CVA with telemetry demonstrating sinus rhythm no atrial arrhythmias. Patient underwent implantable loop recorder per cardiology services 08/15/2012 for monitoring. Neurology services consulted maintained on aspirin therapy as prior to hospital admission as well as the addition of subcutaneous heparin for DVT prophylaxis. Patient did not receive TPA. Noted hypokalemia 2.8 with supplement added 08/16/2012. Physical therapy evaluation completed with recommendations of physical medicine rehabilitation consult to consider inpatient rehabilitation services. Patient was felt to be a good candidate for inpatient rehabilitation services and was admitted for comprehensive rehabilitation program  Review of Systems  Respiratory: Positive for cough.  Cardiovascular: Positive for chest pain.  Gastrointestinal: Positive for constipation.  Neurological: Positive for weakness.  All other systems reviewed and are negative   Past Medical History  Diagnosis Date  . Hypertension   . Hyperlipidemia LDL goal < 70 07/24/2012  . STEMI (ST elevation myocardial infarction), secondary to disease of RCA, with disease  of LAD and diag. branch and chronic non dominant LCX occlusion for CABG  07/21/2012  . 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 07/25/12 07/28/2012    POSTOPERATIVE DIAGNOSIS: Severe 3-vessel coronary disease, status post  myocardial infarction.  PROCEDURE: Median sternotomy, extracorporeal circulation, coronary  artery bypass grafting x5 (left internal mammary artery to LAD,  sequential saphenous vein graft to obtuse marginal 1, obtuse marginal 2,  and posterior descending with Y graft to 1st diagonal), endoscopic vein  harvest, left thigh.    . Tobacco abuse 07/21/2012   Past Surgical History  Procedure Laterality Date  . Coronary artery bypass graft N/A 07/25/2012    Procedure: CORONARY ARTERY BYPASS GRAFTING (CABG) times five with Left  Endoscopic Saphenous Vein Harvest and Left Internal  Mammary ;  Surgeon: Steven C Hendrickson, MD;  Location: [facility] OR;  Service: Open Heart Surgery;  Laterality: N/A;   Family History  Problem Relation Age of Onset  . Hypertension Mother   . Stroke Brother     Died at age 39 secondary to MI  . CAD Brother    Social History:  reports that she has been smoking Cigarettes.  She has been smoking about 0.25 packs per day. She does not have any smokeless tobacco history on file. She reports that she does not drink alcohol or use illicit drugs. Allergies: No Known Allergies Medications Prior to Admission  Medication Sig Dispense Refill  . aspirin EC 325 MG tablet Take 325 mg by mouth daily.      . atorvastatin (LIPITOR) 80 MG tablet Take 80 mg by mouth daily.      . cloNIDine (CATAPRES) 0.2 MG tablet Take 0.2 mg by mouth 2 (two) times daily.      . lisinopril (PRINIVIL,ZESTRIL) 10 MG tablet Take 10 mg by mouth daily.      . metoprolol (LOPRESSOR) 50   MG tablet Take 50 mg by mouth 2 (two) times daily.      . traMADol (ULTRAM) 50 MG tablet Take 50-100 mg by mouth every 6 (six) hours as needed for pain.        Home: Home Living Family/patient expects  to be discharged to:: Private residence Living Arrangements: Parent;Other (Comment) (since CABG) Available Help at Discharge: Family;Available 24 hours/day;Other (Comment) Type of Home: House Home Access: Stairs to enter Entrance Stairs-Number of Steps: 4-5 Entrance Stairs-Rails: None Home Layout: One level Home Equipment: Walker - 2 wheels;Cane - single point   Functional History: Prior Function Comments: pt was independent and lived alone prior to CABG; has been living with mother since; was amb without SPC or RW in the last few days prior to hospitalization  Functional Status:  Mobility: Bed Mobility Bed Mobility: Supine to Sit;Sit to Supine Supine to Sit: 4: Min assist;HOB elevated (only min assist because of HOB being elevated.) Sitting - Scoot to Edge of Bed: 3: Mod assist;With rail Sit to Supine: 3: Mod assist;HOB flat;With rail (impulsive) Transfers Transfers: Sit to Stand;Stand to Sit Sit to Stand: 4: Min assist Stand to Sit: 4: Min assist Ambulation/Gait Ambulation/Gait Assistance: 4: Min assist Ambulation Distance (Feet): 15 Feet (x2 to BR, then about 600 in halls pushing pole and controlli) Assistive device: Other (Comment);1 person hand held assist (iv pole) Ambulation/Gait Assistance Details: amb in small spaces such as going to the bathroom involved controlling her hemibody more due to incr. ataxia or R LE and inabililty to control R UE.  Pt had extreme difficulty with direction changes esp. baqcking up to toilet or sidestepping to center in front of the toilet, but in a straight line pushing the iv pole in her L UE and therapist helping control R UE and trunk pt did better displaying a stable Ext Rot'd HP gait with some ataxia and a tendency to wander L.  Not as much tendency to list L Gait Pattern: Step-through pattern;Ataxic;Narrow base of support (more stable HP gait) Gait velocity: decreased Stairs: No Wheelchair Mobility Wheelchair Mobility:  No  ADL: ADL Eating/Feeding: Maximal assistance Where Assessed - Eating/Feeding: Chair Grooming: Maximal assistance;Wash/dry hands;Wash/dry face;Teeth care Where Assessed - Grooming: Supported sitting Upper Body Bathing: Maximal assistance Where Assessed - Upper Body Bathing: Supported sitting Lower Body Bathing: +1 Total assistance Where Assessed - Lower Body Bathing: Supported sitting Upper Body Dressing: Maximal assistance Where Assessed - Upper Body Dressing: Supported sitting Lower Body Dressing: Maximal assistance Where Assessed - Lower Body Dressing: Supported sit to stand Toilet Transfer: Moderate assistance Toilet Transfer Method: Sit to stand Toilet Transfer Equipment: Comfort height toilet;Grab bars Tub/Shower Transfer: Maximal assistance Tub/Shower Transfer Method: Stand pivot Equipment Used: Gait belt;Rolling walker Transfers/Ambulation Related to ADLs: Pt needed max tactile and vc's for transfers and ambulation. Pt unable to keep RUE on RW, and performed better with hand held assist. Pt with heavy lean/push to the left when standing without RW. Required tactile cues to correct and stand up. ADL Comments: Session focused on vision. Pt with deficits in the right periphery, and potentially more. Pt was having problems following one step commands. Pt required visual and auditory stimulis to track pen, and then it was not sustained. Pt asked to draw clock. Pt unsuccessful with RUE movements uncontrolled, and Pt kepy loosing grip of the pen. Pt attempted with the left hand, and Pt able to draw circle and put 4 lines where 12, 3, 6, and 9 would be. Pt demonstrated good problem   solving. Pt able to scan length of 8x11.5 sheet of paper for items with head turn down and right to use left eye and central gaze.   Cognition: Cognition Overall Cognitive Status: Impaired/Different from baseline Arousal/Alertness: Awake/alert Orientation Level: Oriented to person;Disoriented to  place;Disoriented to situation;Oriented to time Attention: Focused;Sustained;Selective Focused Attention: Appears intact Sustained Attention: Appears intact Selective Attention: Appears intact Memory:  (UTA) Awareness: Impaired Awareness Impairment: Intellectual impairment;Emergent impairment Problem Solving: Impaired Problem Solving Impairment: Verbal basic;Functional basic Cognition Arousal/Alertness: Awake/alert Behavior During Therapy: Impulsive Overall Cognitive Status: Impaired/Different from baseline Area of Impairment: Attention;Memory;Following commands;Awareness Orientation Level: Disoriented to;Place;Situation Current Attention Level: Alternating Memory: Decreased short-term memory Following Commands: Follows one step commands inconsistently Safety/Judgement: Decreased awareness of safety;Decreased awareness of deficits Awareness: Intellectual;Anticipatory;Emergent Problem Solving: Slow processing;Decreased initiation;Difficulty sequencing;Requires verbal cues;Requires tactile cues General Comments: pt is a poor historian, is aphasic, perseverates on certain phrases and questions during session; sister present to give baseline cognition and PLOF   Physical Exam: Blood pressure 182/74, pulse 62, temperature 97.9 F (36.6 C), temperature source Oral, resp. rate 18, height 5' 1" (1.549 m), weight 53.6 kg (118 lb 2.7 oz), SpO2 100.00%. Physical Exam  Vitals reviewed.  HENT: dentition poor, oral mucosa pink and moist Head: Normocephalic.  Eyes: EOM are normal.  Neck: Normal range of motion. Neck supple. No thyromegaly present.  Cardiovascular:  Cardiac rate controlled, no murmurs auscultated Pulmonary/Chest: Breath sounds normal. No respiratory distress. No wheezes Abdominal: Soft. Bowel sounds are normal. She exhibits no distension. No pain Neurological: She is alert.  Patient was able to name person, place and date of birth. She was a bit impulsive. Limited attention  span.  Follows three-step commands. ?visual field loss to right (difficult to assess accurately due to her impulsive behavior and attention). Significant ataxia right arm and leg. Decreased PP and LT in right arm and leg as well. Reasonable insight and awareness when examiner was able to slow her down enough to have a conversation.  Skin:  Midline chest incision well-healed  Psychiatric: She has a normal mood and affect. Very pleasant and cooperative!   Results for orders placed during the hospital encounter of 08/13/12 (from the past 48 hour(s))  GLUCOSE, CAPILLARY     Status: None   Collection Time    08/13/12  3:25 PM      Result Value Range   Glucose-Capillary 97  70 - 99 mg/dL  PROTIME-INR     Status: None   Collection Time    08/13/12  4:20 PM      Result Value Range   Prothrombin Time 14.0  11.6 - 15.2 seconds   INR 1.10  0.00 - 1.49  APTT     Status: None   Collection Time    08/13/12  4:20 PM      Result Value Range   aPTT 26  24 - 37 seconds  CBC     Status: Abnormal   Collection Time    08/13/12  4:20 PM      Result Value Range   WBC 8.8  4.0 - 10.5 K/uL   RBC 3.66 (*) 3.87 - 5.11 MIL/uL   Hemoglobin 11.5 (*) 12.0 - 15.0 g/dL   HCT 34.1 (*) 36.0 - 46.0 %   MCV 93.2  78.0 - 100.0 fL   MCH 31.4  26.0 - 34.0 pg   MCHC 33.7  30.0 - 36.0 g/dL   RDW 14.7  11.5 - 15.5 %   Platelets 427 (*) 150 -   400 K/uL  DIFFERENTIAL     Status: Abnormal   Collection Time    08/13/12  4:20 PM      Result Value Range   Neutrophils Relative % 78 (*) 43 - 77 %   Neutro Abs 6.8  1.7 - 7.7 K/uL   Lymphocytes Relative 13  12 - 46 %   Lymphs Abs 1.1  0.7 - 4.0 K/uL   Monocytes Relative 8  3 - 12 %   Monocytes Absolute 0.7  0.1 - 1.0 K/uL   Eosinophils Relative 1  0 - 5 %   Eosinophils Absolute 0.0  0.0 - 0.7 K/uL   Basophils Relative 1  0 - 1 %   Basophils Absolute 0.1  0.0 - 0.1 K/uL  COMPREHENSIVE METABOLIC PANEL     Status: Abnormal   Collection Time    08/13/12  4:20 PM       Result Value Range   Sodium 136  135 - 145 mEq/L   Potassium 3.6  3.5 - 5.1 mEq/L   Chloride 96  96 - 112 mEq/L   CO2 25  19 - 32 mEq/L   Glucose, Bld 105 (*) 70 - 99 mg/dL   BUN 10  6 - 23 mg/dL   Creatinine, Ser 0.63  0.50 - 1.10 mg/dL   Calcium 9.9  8.4 - 10.5 mg/dL   Total Protein 7.5  6.0 - 8.3 g/dL   Albumin 3.3 (*) 3.5 - 5.2 g/dL   AST 19  0 - 37 U/L   ALT 23  0 - 35 U/L   Alkaline Phosphatase 77  39 - 117 U/L   Total Bilirubin 0.6  0.3 - 1.2 mg/dL   GFR calc non Af Amer >90  >90 mL/min   GFR calc Af Amer >90  >90 mL/min   Comment:            The eGFR has been calculated     using the CKD EPI equation.     This calculation has not been     validated in all clinical     situations.     eGFR's persistently     <90 mL/min signify     possible Chronic Kidney Disease.  TROPONIN I     Status: None   Collection Time    08/13/12  4:20 PM      Result Value Range   Troponin I <0.30  <0.30 ng/mL   Comment:            Due to the release kinetics of cTnI,     a negative result within the first hours     of the onset of symptoms does not rule out     myocardial infarction with certainty.     If myocardial infarction is still suspected,     repeat the test at appropriate intervals.  POCT I-STAT TROPONIN I     Status: None   Collection Time    08/13/12  4:31 PM      Result Value Range   Troponin i, poc 0.03  0.00 - 0.08 ng/mL   Comment 3            Comment: Due to the release kinetics of cTnI,     a negative result within the first hours     of the onset of symptoms does not rule out     myocardial infarction with certainty.     If myocardial infarction is still suspected,       repeat the test at appropriate intervals.  HEMOGLOBIN A1C     Status: None   Collection Time    08/14/12  6:35 AM      Result Value Range   Hemoglobin A1C 5.4  <5.7 %   Comment: (NOTE)                                                                               According to the ADA Clinical  Practice Recommendations for 2011, when     HbA1c is used as a screening test:      >=6.5%   Diagnostic of Diabetes Mellitus               (if abnormal result is confirmed)     5.7-6.4%   Increased risk of developing Diabetes Mellitus     References:Diagnosis and Classification of Diabetes Mellitus,Diabetes     Care,2011,34(Suppl 1):S62-S69 and Standards of Medical Care in             Diabetes - 2011,Diabetes Care,2011,34 (Suppl 1):S11-S61.   Mean Plasma Glucose 108  <117 mg/dL  LIPID PANEL     Status: Abnormal   Collection Time    08/14/12  6:35 AM      Result Value Range   Cholesterol 90  0 - 200 mg/dL   Triglycerides 72  <150 mg/dL   HDL 22 (*) >39 mg/dL   Total CHOL/HDL Ratio 4.1     VLDL 14  0 - 40 mg/dL   LDL Cholesterol 54  0 - 99 mg/dL   Comment:            Total Cholesterol/HDL:CHD Risk     Coronary Heart Disease Risk Table                         Men   Women      1/2 Average Risk   3.4   3.3      Average Risk       5.0   4.4      2 X Average Risk   9.6   7.1      3 X Average Risk  23.4   11.0                Use the calculated Patient Ratio     above and the CHD Risk Table     to determine the patient's CHD Risk.                ATP III CLASSIFICATION (LDL):      <100     mg/dL   Optimal      100-129  mg/dL   Near or Above                        Optimal      130-159  mg/dL   Borderline      160-189  mg/dL   High      >190     mg/dL   Very High  TROPONIN I     Status: None   Collection Time    08/14/12  9:17 PM        Result Value Range   Troponin I <0.30  <0.30 ng/mL   Comment:            Due to the release kinetics of cTnI,     a negative result within the first hours     of the onset of symptoms does not rule out     myocardial infarction with certainty.     If myocardial infarction is still suspected,     repeat the test at appropriate intervals.  AMMONIA     Status: None   Collection Time    08/15/12  6:00 AM      Result Value Range   Ammonia 26  11 -  60 umol/L  BASIC METABOLIC PANEL     Status: Abnormal   Collection Time    08/15/12  6:10 AM      Result Value Range   Sodium 138  135 - 145 mEq/L   Potassium 2.8 (*) 3.5 - 5.1 mEq/L   Chloride 103  96 - 112 mEq/L   CO2 22  19 - 32 mEq/L   Glucose, Bld 90  70 - 99 mg/dL   BUN 4 (*) 6 - 23 mg/dL   Creatinine, Ser 0.55  0.50 - 1.10 mg/dL   Calcium 8.8  8.4 - 10.5 mg/dL   GFR calc non Af Amer >90  >90 mL/min   GFR calc Af Amer >90  >90 mL/min   Comment:            The eGFR has been calculated     using the CKD EPI equation.     This calculation has not been     validated in all clinical     situations.     eGFR's persistently     <90 mL/min signify     possible Chronic Kidney Disease.  VITAMIN B12     Status: None   Collection Time    08/15/12  6:10 AM      Result Value Range   Vitamin B-12 459  211 - 911 pg/mL   Dg Chest 2 View  08/14/2012   *RADIOLOGY REPORT*  Clinical Data: Right arm weakness.  Stroke.  CHEST - 2 VIEW  Comparison: 07/28/2012 and 07/24/2012 radiographs.  Findings: The heart size is stable status post CABG.  The pleural effusions and bibasilar atelectasis have improved.  There is no edema or pneumothorax.  The osseous structures appear unchanged.  IMPRESSION: Improving basilar aeration and pleural effusion status post CABG.   Original Report Authenticated By: William Veazey, M.D.   Ct Head (brain) Wo Contrast  08/13/2012   *RADIOLOGY REPORT*  Clinical Data: Altered mental status.  CT HEAD WITHOUT CONTRAST  Technique:  Contiguous axial images were obtained from the base of the skull through the vertex without contrast.  Comparison: None.  Findings: There is decreased attenuation in the left PCA territory with appearance most consistent with acute/subacute infarct. Remote left parietal infarct is also identified.  No hemorrhage, midline shift or hydrocephalus is seen. There is no pneumocephalus. Imaged paranasal sinuses and mastoid air cells are clear.  IMPRESSION:  1.   Findings compatible with acute/subacute infarct in the left PCA territory. 2.  Remote left parietal infarct. 3.  Critical Value/emergent results were called by telephone at the time of interpretation on 08/13/2012 at 1534 hours to Dr. Iva Knapp, who verbally acknowledged these results.   Original Report Authenticated By: Thomas D'Alessio, M.D.   Mr Brain Wo Contrast  08/14/2012   *RADIOLOGY   REPORT*  Clinical Data:  Right-sided weakness.  Stroke risk factors include hypertension, hyperlipidemia, prior myocardial infarction, and tobacco abuse.  MRI HEAD WITHOUT CONTRAST MRA HEAD WITHOUT CONTRAST  Technique:  Multiplanar, multiecho pulse sequences of the brain and surrounding structures were obtained without intravenous contrast. Angiographic images of the head were obtained using MRA technique without contrast.  Comparison:  CT head 08/13/2012.  MRI HEAD  Findings:  Large acute left hemisphere infarct affects predominately  PCA territory including the posterior temporal lobe, much of the occipital lobe, portions of the parietal lobe, and much of the thalamus and posterior limb internal capsule, all on the left. There is no acute or subacute hemorrhage.  There is no midline shift.  There is a remote left parietal cortical and subcortical infarct with associated gliosis and encephalomalacia.  This is associated with slight chronic gyral microhemorrhage.  This infarct lies within the left MCA distribution.  There is mild atrophy.  There is mild chronic microvascular ischemic change.  Normal pituitary.    Negative osseous structures. Extracranial soft tissues unremarkable.  IMPRESSION: Acute non hemorrhagic left PCA territory infarct affecting most of that vessel's vascular territory. Chronic left parietal MCA territory infarct.  MRA HEAD  Findings: Significant motion degradation.  Gross patency of the carotid arteries is established.  The basilar artery is patent with left vertebral dominant. The left vertebral is  occluded except for its distal most segment.  This could fill via retrograde flow from the basilar.  There is no appreciable flow related enhancement of the right vertebral at the skull base.  There is no proximal flow limiting stenosis of the middle cerebral arteries.  Focal narrowing at the origin of the right anterior cerebral artery could be 75%. Severe disease bilateral PCA vessels, greater on the left. Moderate irregularity distal MCA vessels bilaterally.  IMPRESSION: Motion degraded scan.  Severe disease distal left PCA.  Basilar artery grossly patent. Abnormal distal right vertebral.   Original Report Authenticated By: John Curnes, M.D.   Mr Mra Head/brain Wo Cm  08/14/2012   *RADIOLOGY REPORT*  Clinical Data:  Right-sided weakness.  Stroke risk factors include hypertension, hyperlipidemia, prior myocardial infarction, and tobacco abuse.  MRI HEAD WITHOUT CONTRAST MRA HEAD WITHOUT CONTRAST  Technique:  Multiplanar, multiecho pulse sequences of the brain and surrounding structures were obtained without intravenous contrast. Angiographic images of the head were obtained using MRA technique without contrast.  Comparison:  CT head 08/13/2012.  MRI HEAD  Findings:  Large acute left hemisphere infarct affects predominately  PCA territory including the posterior temporal lobe, much of the occipital lobe, portions of the parietal lobe, and much of the thalamus and posterior limb internal capsule, all on the left. There is no acute or subacute hemorrhage.  There is no midline shift.  There is a remote left parietal cortical and subcortical infarct with associated gliosis and encephalomalacia.  This is associated with slight chronic gyral microhemorrhage.  This infarct lies within the left MCA distribution.  There is mild atrophy.  There is mild chronic microvascular ischemic change.  Normal pituitary.    Negative osseous structures. Extracranial soft tissues unremarkable.  IMPRESSION: Acute non hemorrhagic left  PCA territory infarct affecting most of that vessel's vascular territory. Chronic left parietal MCA territory infarct.  MRA HEAD  Findings: Significant motion degradation.  Gross patency of the carotid arteries is established.  The basilar artery is patent with left vertebral dominant. The left vertebral is occluded except for its distal most segment.  This   could fill via retrograde flow from the basilar.  There is no appreciable flow related enhancement of the right vertebral at the skull base.  There is no proximal flow limiting stenosis of the middle cerebral arteries.  Focal narrowing at the origin of the right anterior cerebral artery could be 75%. Severe disease bilateral PCA vessels, greater on the left. Moderate irregularity distal MCA vessels bilaterally.  IMPRESSION: Motion degraded scan.  Severe disease distal left PCA.  Basilar artery grossly patent. Abnormal distal right vertebral.   Original Report Authenticated By: John Curnes, M.D.    Post Admission Physician Evaluation: 1. Functional deficits secondary  to embolic left PCA infarct. 2. Patient is admitted to receive collaborative, interdisciplinary care between the physiatrist, rehab nursing staff, and therapy team. 3. Patient's level of medical complexity and substantial therapy needs in context of that medical necessity cannot be provided at a lesser intensity of care such as a SNF. 4. Patient has experienced substantial functional loss from his/her baseline which was documented above under the "Functional History" and "Functional Status" headings.  Judging by the patient's diagnosis, physical exam, and functional history, the patient has potential for functional progress which will result in measurable gains while on inpatient rehab.  These gains will be of substantial and practical use upon discharge  in facilitating mobility and self-care at the household level. 5. Physiatrist will provide 24 hour management of medical needs as well as  oversight of the therapy plan/treatment and provide guidance as appropriate regarding the interaction of the two. 6. 24 hour rehab nursing will assist with bladder management, bowel management, safety, skin/wound care, disease management, medication administration, pain management and patient education  and help integrate therapy concepts, techniques,education, etc. 7. PT will assess and treat for/with: Lower extremity strength, range of motion, stamina, balance, functional mobility, safety, adaptive techniques and equipment, NMR, education.   Goals are: supervision to mod I. 8. OT will assess and treat for/with: ADL's, functional mobility, safety, upper extremity strength, adaptive techniques and equipment, NMR, education.   Goals are: supervision to mod I. 9. SLP will assess and treat for/with: speech,communication, cognition.  Goals are: mod I to supervision. 10. Case Management and Social Worker will assess and treat for psychological issues and discharge planning. 11. Team conference will be held weekly to assess progress toward goals and to determine barriers to discharge. 12. Patient will receive at least 3 hours of therapy per day at least 5 days per week. 13. ELOS: 10 days to 2 weeks       14. Prognosis:  excellent   Medical Problem List and Plan: 1. 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 2. DVT Prophylaxis/Anticoagulation: Subcutaneous heparin. Monitor platelet counts any signs of bleeding 3. Pain Management: Tylenol as needed. 4. Neuropsych: This patient is capable of making decisions on her own behalf. 5. 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 Followup cardiology services as needed. No chest pain or shortness of breath. 6. Hypertension. Clonidine 0.2 mg twice a day, lisinopril 10 mg daily, Lopressor 50 mg twice a day. Monitor with increased activity 7. Hyperlipidemia. Lipitor 8. Tobacco abuse. Counseling 9. Hypokalemia. Followup chemistries on admit.  Zachary  T. Swartz, MD, FAAPMR [facility]  08/15/2012 

## 2012-08-15 NOTE — Progress Notes (Signed)
Physical Therapy Treatment Patient Details Name: [name] [name] MRN: [id number] DOB: Oct 17, 1950 Today's Date: 08/15/2012 Time: 4098-1191 PT Time Calculation (min): 25 min  PT Assessment / Plan / Recommendation  PT Comments   Pt has little to no control over R UE and display ataxia in R LE that is much more difficult to help pt control in a smaller confined environment while concentrating on an ADL task.  Alternately,  R hemi body control can be facilitated easier when ambulating in an open space where pt's focus is not divided by other tasks.  Pt would be a great CIR candidate.   Follow Up Recommendations  CIR     Does the patient have the potential to tolerate intense rehabilitation     Barriers to Discharge        Equipment Recommendations   (TBD post acute)    Recommendations for Other Services Rehab consult  Frequency Min 4X/week   Progress towards PT Goals Progress towards PT goals: Progressing toward goals  Plan Current plan remains appropriate    Precautions / Restrictions Precautions Precautions: Sternal;Fall Precaution Comments: s/p CABG 2 weeks prior  Restrictions Weight Bearing Restrictions: No   Pertinent Vitals/Pain     Mobility  Bed Mobility Bed Mobility: Supine to Sit;Sit to Supine Supine to Sit: 4: Min assist;HOB elevated (only min assist because of HOB being elevated.) Sitting - Scoot to Edge of Bed: 3: Mod assist;With rail Sit to Supine: 3: Mod assist;HOB flat;With rail (impulsive) Details for Bed Mobility Assistance: (A) needed to bring pt to upright sitting position on EOB; required HOB elevated and multimodal cues for hand placement and sequencing. Pt very impulsive when going from sit <> supine. required max tactile and vc's from OTS to slow down and be safe with RUE. Transfers Sit to Stand: 4: Min assist Stand to Sit: 4: Min assist Details for Transfer Assistance: cues for hand position, safe technique; stability assist and control of trunck and R  UE.  Likely easier due to not standing into RW. Ambulation/Gait Ambulation/Gait Assistance: 4: Min assist Ambulation Distance (Feet): 15 Feet (x2 to BR, then about 600 in halls pushing pole and controlling/facil. R hemi body Assistive device: Other (Comment);1 person hand held assist (iv pole) Ambulation/Gait Assistance Details: amb in small spaces such as going to the bathroom involved controlling her hemibody more due to incr. ataxia or R LE and inabililty to control R UE.  Pt had extreme difficulty with direction changes esp. baqcking up to toilet or sidestepping to center in front of the toilet, but in a straight line pushing the iv pole in her L UE and therapist helping control R UE and trunk pt did better displaying a stable Ext Rot'd HP gait with some ataxia and a tendency to wander L.  Not as much tendency to list L Gait Pattern: Step-through pattern;Ataxic;Narrow base of support (more stable HP gait) Gait velocity: decreased Stairs: No Wheelchair Mobility Wheelchair Mobility: No Modified Rankin (Stroke Patients Only) Modified Rankin: Moderate disability    Exercises     PT Diagnosis:    PT Problem List:   PT Treatment Interventions:     PT Goals (current goals can now be found in the care plan section) Acute Rehab PT Goals Patient Stated Goal: none stated PT Goal Formulation: With patient/family Time For Goal Achievement: 08/21/12 Potential to Achieve Goals: Good  Visit Information  Last PT Received On: 08/15/12 Assistance Needed: +2 (for safety with ambulation) History of Present Illness: 62 y.o. female recovering  from coronary artery bypass graft surgery performed approximately 2 weeks ago now presenting with confusion, word finding difficulties, decreased sensation of the right upper extremity and face, and coordination problems with the right upper extremity. MRI shows Large acute left hemisphere infarct affects predominately PCA territory including the posterior temporal  lobe, much of the occipital lobe, portions of the parietal lobe, and much of the thalamus and posterior limb internal capsule    Subjective Data  Subjective: The (arm) isn't right Patient Stated Goal: none stated   Cognition  Cognition Arousal/Alertness: Awake/alert Behavior During Therapy: Impulsive Overall Cognitive Status: Impaired/Different from baseline Area of Impairment: Attention;Memory;Following commands;Awareness Current Attention Level: Alternating Memory: Decreased short-term memory Following Commands: Follows one step commands inconsistently Safety/Judgement: Decreased awareness of safety;Decreased awareness of deficits Awareness: Intellectual;Anticipatory;Emergent Problem Solving: Slow processing;Decreased initiation;Difficulty sequencing;Requires verbal cues;Requires tactile cues    Balance  Balance Balance Assessed: Yes Static Sitting Balance Static Sitting - Balance Support: Bilateral upper extremity supported;Feet supported Static Sitting - Level of Assistance: 5: Stand by assistance Static Standing Balance Static Standing - Balance Support: Bilateral upper extremity supported;During functional activity Static Standing - Level of Assistance: Other (comment) (min guard Assist) Static Standing - Comment/# of Minutes: 1-2 min standing rest propped on the counter  End of Session PT - End of Session Equipment Utilized During Treatment: Gait belt Activity Tolerance: Patient tolerated treatment well Patient left: in chair;with call bell/phone within reach Nurse Communication: Mobility status   GP     Baker Kogler, Eliseo Gum 08/15/2012, 10:43 AM 08/15/2012  Biggs Bing, PT [phone]  (pager)

## 2012-08-15 NOTE — PMR Pre-admission (Signed)
PMR Admission Coordinator Pre-Admission Assessment  Patient: [name] [name] is an 62 y.o., female MRN: [id number] DOB: 10-30-1950 Height: 5\' 1"  (154.9 cm) Weight: 53.6 kg (118 lb 2.7 oz)              Insurance Information HMO:     PPO:      PCP:      IPA:      80/20:      OTHER:  PRIMARY: self pay       Medicaid Application Date: retro and ongoing mad appt 7/18 at 1030      Case Manager: per Merry Proud with financial counselor Disability Application Date: 08/01/12 [facility]      Case Worker: Merry Proud , Radio producer Information   Name Relation Home Work Mobile   [place] Sister   [phone]   Bynum,Lillie Mother [phone]       Current Medical History  Patient Admitting Diagnosis: left PCA infarct History of Present Illness: [name] [name] is a 62 y.o. right-handed female with history of hypertension, tobacco abuse, coronary artery disease with coronary artery bypass grafting and recent discharge after her CABG 07/31/2012. Admitted 08/13/2012 with right-sided weakness and aphasia. MRI of the brain showed acute nonhemorrhagic left PCA territory infarct MRA of the head was severe disease distal left PCA and basilar artery grossly patent. Echocardiogram with ejection fraction of 60% grade 2 diastolic dysfunction. Carotid Dopplers with less than 39% ICA stenosis. TEE is pending 08/15/12. Neurology services consulted maintained on aspirin therapy as prior to hospital admission as well as the addition of subcutaneous heparin for DVT prophylaxis. Patient did not receive TPA.  Implantable loop recorder on 08/15/12.  Total: 7 NIH   Past Medical History  Past Medical History  Diagnosis Date  . Hypertension   . Hyperlipidemia LDL goal < 70 07/24/2012  . STEMI (ST elevation myocardial infarction), secondary to disease of RCA, with disease of LAD and diag. branch and chronic non dominant LCX occlusion for CABG  07/21/2012  . S/P CABG x  5 : LIMA-LAD, SVG-OM1-OM2, SVG-(Y)-DIAG-PDA. 07/25/12 07/28/2012    POSTOPERATIVE DIAGNOSIS: Severe 3-vessel coronary disease, status post  myocardial infarction.  PROCEDURE: Median sternotomy, extracorporeal circulation, coronary  artery bypass grafting x5 (left internal mammary artery to LAD,  sequential saphenous vein graft to obtuse marginal 1, obtuse marginal 2,  and posterior descending with Y graft to 1st diagonal), endoscopic vein  harvest, left thigh.    . Tobacco abuse 07/21/2012  . Stroke     Family History  family history includes CAD in her brother; Hypertension in her mother; and Stroke in her brother.  Prior Rehab/Hospitalizations: none  Current Medications  Current facility-administered medications:aspirin tablet 325 mg, 325 mg, Oral, Daily, Clydia Llano, MD, 325 mg at 08/15/12 1323;  atorvastatin (LIPITOR) tablet 80 mg, 80 mg, Oral, q1800, Clydia Llano, MD, 80 mg at 08/15/12 2127;  cloNIDine (CATAPRES) tablet 0.2 mg, 0.2 mg, Oral, BID, Clydia Llano, MD, 0.2 mg at 08/15/12 2127;  gadobenate dimeglumine (MULTIHANCE) injection 20 mL, 20 mL, Intravenous, Once, Medication Radiologist, MD heparin injection 5,000 Units, 5,000 Units, Subcutaneous, Q8H, Clydia Llano, MD, 5,000 Units at 08/16/12 0541;  lisinopril (PRINIVIL,ZESTRIL) tablet 20 mg, 20 mg, Oral, Daily, Onalee Hua Tat, MD;  metoprolol (LOPRESSOR) tablet 50 mg, 50 mg, Oral, BID, Clydia Llano, MD, 50 mg at 08/15/12 2127;  potassium chloride SA (K-DUR,KLOR-CON) CR tablet 60 mEq, 60 mEq, Oral, Once, Zannie Cove, MD senna-docusate (Senokot-S) tablet 1 tablet, 1 tablet, Oral,  QHS PRN, Clydia Llano, MD  Patients Current Diet: Regular diet with thin liquids  Precautions / Restrictions Precautions Precautions: Sternal;Fall Precaution Comments: s/p CABG 2 weeks prior  Restrictions Weight Bearing Restrictions: No   Prior Activity Level Limited Community (1-2x/wk): limited since CABG 07/2012  Home Assistive Devices / Equipment Home  Assistive Devices/Equipment: None Home Equipment: Environmental consultant - 2 wheels;Cane - single point  Prior Functional Level Prior Function Level of Independence: Needs assistance Gait / Transfers Assistance Needed: since CABG pt has progressed to amb without AD; per Sister  ADL's / Homemaking Assistance Needed: mother cooks and cleans for pt Communication / Swallowing Assistance Needed: word finding problems- expressive aphasia Comments: pt was independent and lived alone prior to CABG; has been living with mother since; was amb without SPC or RW in the last few days prior to hospitalization  Current Functional Level Cognition  Arousal/Alertness: Awake/alert Overall Cognitive Status: Impaired/Different from baseline Current Attention Level: Alternating Orientation Level: Oriented to person;Disoriented to place;Disoriented to situation;Oriented to time Following Commands: Follows one step commands inconsistently Safety/Judgement: Decreased awareness of safety;Decreased awareness of deficits General Comments: pt is a poor historian, is aphasic, perseverates on certain phrases and questions during session; sister present to give baseline cognition and PLOF  Attention: Focused;Sustained;Selective Focused Attention: Appears intact Sustained Attention: Appears intact Selective Attention: Appears intact Memory:  (UTA) Awareness: Impaired Awareness Impairment: Intellectual impairment;Emergent impairment Problem Solving: Impaired Problem Solving Impairment: Verbal basic;Functional basic    Extremity Assessment (includes Sensation/Coordination)  Upper Extremity Assessment: RUE deficits/detail;Difficult to assess due to impaired cognition RUE Deficits / Details: Pt with ataxic movement and inattention to RUE. RUE Sensation: decreased proprioception;decreased light touch RUE Coordination: decreased fine motor;decreased gross motor  Lower Extremity Assessment: Defer to PT evaluation RLE Deficits /  Details: pt with ataxic movement of R LE and UE; pt was able to perform LAQ with R LE; unable to MMT due to decreased cognition RLE Sensation: decreased light touch;decreased proprioception    ADLs  Eating/Feeding: Maximal assistance Where Assessed - Eating/Feeding: Chair Grooming: Maximal assistance;Wash/dry hands;Wash/dry face;Teeth care Where Assessed - Grooming: Supported sitting Upper Body Bathing: Maximal assistance Where Assessed - Upper Body Bathing: Supported sitting Lower Body Bathing: +1 Total assistance Where Assessed - Lower Body Bathing: Supported sitting Upper Body Dressing: Maximal assistance Where Assessed - Upper Body Dressing: Supported sitting Lower Body Dressing: Maximal assistance Where Assessed - Lower Body Dressing: Supported sit to Pharmacist, hospital: Moderate assistance Toilet Transfer Method: Sit to stand Toilet Transfer Equipment: Comfort height toilet;Grab bars Toileting - Clothing Manipulation and Hygiene: Maximal assistance Where Assessed - Glass blower/designer Manipulation and Hygiene: Standing Tub/Shower Transfer: Maximal assistance Tub/Shower Transfer Method: Stand pivot Equipment Used: Gait belt;Rolling walker Transfers/Ambulation Related to ADLs: Pt needed max tactile and vc's for transfers and ambulation. Pt unable to keep RUE on RW, and performed better with hand held assist. Pt with heavy lean/push to the left when standing without RW. Required tactile cues to correct and stand up. ADL Comments: Session focused on vision. Pt with deficits in the right periphery, and potentially more. Pt was having problems following one step commands. Pt required visual and auditory stimulis to track pen, and then it was not sustained. Pt asked to draw clock. Pt unsuccessful with RUE movements uncontrolled, and Pt kepy loosing grip of the pen. Pt attempted with the left hand, and Pt able to draw circle and put 4 lines where 12, 3, 6, and 9 would be. Pt demonstrated  good problem solving. Pt  able to scan length of 8x11.5 sheet of paper for items with head turn down and right to use left eye and central gaze.     Mobility  Bed Mobility: Supine to Sit;Sit to Supine Supine to Sit: 4: Min assist;HOB elevated (only min assist because of HOB being elevated.) Sitting - Scoot to Edge of Bed: 3: Mod assist;With rail Sit to Supine: 3: Mod assist;HOB flat;With rail (impulsive)    Transfers  Transfers: Sit to Stand;Stand to Sit Sit to Stand: 4: Min assist Stand to Sit: 4: Min assist    Ambulation / Gait / Stairs / Psychologist, prison and probation services  Ambulation/Gait Ambulation/Gait Assistance: 4: Min Environmental consultant (Feet): 15 Feet (x2 to BR, then about 600 in halls pushing pole and controlli) Assistive device: Other (Comment);1 person hand held assist (iv pole) Ambulation/Gait Assistance Details: amb in small spaces such as going to the bathroom involved controlling her hemibody more due to incr. ataxia or R LE and inabililty to control R UE.  Pt had extreme difficulty with direction changes esp. baqcking up to toilet or sidestepping to center in front of the toilet, but in a straight line pushing the iv pole in her L UE and therapist helping control R UE and trunk pt did better displaying a stable Ext Rot'd HP gait with some ataxia and a tendency to wander L.  Not as much tendency to list L Gait Pattern: Step-through pattern;Ataxic;Narrow base of support (more stable HP gait) Gait velocity: decreased Stairs: No Wheelchair Mobility Wheelchair Mobility: No    Posture / Games developer Sitting - Balance Support: Bilateral upper extremity supported;Feet supported Static Sitting - Level of Assistance: 5: Stand by assistance Static Standing Balance Static Standing - Balance Support: Bilateral upper extremity supported;During functional activity Static Standing - Level of Assistance: Other (comment) (min guard Assist) Static Standing - Comment/# of  Minutes: 1-2 min standing rest propped on the counter    Special needs/care consideration Bowel mgmt:continent Bladder mgmt:continent    Previous Home Environment Living Arrangements: Parent;Other (Comment) (Pt's 30 year old mentally handicapped daughter, Marylene Land)  Lives With: Family Available Help at Discharge: Family;Available 24 hours/day;Other (Comment) Type of Home: House Home Layout: One level Home Access: Stairs to enter Entrance Stairs-Rails: None Entrance Stairs-Number of Steps: 4-5 Home Care Services: No  Discharge Living Setting Plans for Discharge Living Setting: Lives with (comment);Other (Comment) (66 year old mother and pt's 73 year old mentally handicapped) Type of Home at Discharge: House Discharge Home Layout: One level Discharge Home Access: Stairs to enter Entrance Stairs-Rails: None Entrance Stairs-Number of Steps: 4- 5 steps Does the patient have any problems obtaining your medications?:  (no insurance so yes)  Social/Family/Support Systems Patient Roles: Other (Comment) (39 year old daughter, Marylene Land raised by pt's mother since [place]) Contact Information: Trish Mage, sister Anticipated Caregiver: pt's 100 year old Mom, sister Drinda Butts), and 10 yo daughter Marylene Land Anticipated Industrial/product designer Information: see above Ability/Limitations of Caregiver: 63 year old MOm with r hand carpal tunnel issues. Sister lives close and comes daily, Marylene Land is mentally handicapped Caregiver Availability: 24/7 Discharge Plan Discussed with Primary Caregiver: Yes Is Caregiver In Agreement with Plan?: Yes Does Caregiver/Family have Issues with Lodging/Transportation while Pt is in Rehab?: No   Goals/Additional Needs Patient/Family Goal for Rehab: Supervision to min assist with PT and OT, supervision with SLP Expected length of stay: ELOS 2 weeks Pt/Family Agrees to Admission and willing to participate: Yes Program Orientation Provided & Reviewed with Pt/Caregiver  Including Roles  & Responsibilities: Yes   Decrease burden of Care through IP rehab admission: n/a  Possible need for SNF placement upon discharge:not anticipated   Patient Condition: This patient's medical and functional status has changed since the consult dated: 08/14/12 in which the Rehabilitation Physician determined and documented that the patient's condition is appropriate for intensive rehabilitative care in an inpatient rehabilitation facility. See "History of Present Illness" (above) for medical update. Functional changes are: overall mod assist, impulsive and ataxic. Patient's medical and functional status update has been discussed with the Rehabilitation physician and patient remains appropriate for inpatient rehabilitation. Will admit to inpatient rehab today.  Preadmission Screen Completed By:  Clois Dupes, 08/16/2012 10:11 AM ______________________________________________________________________   Discussed status with Dr. Riley Kill on 08/16/12 at  1011 and received telephone approval for admission today.  Admission Coordinator:  Clois Dupes, time 1011 Date 08/16/12.

## 2012-08-15 NOTE — Interval H&P Note (Signed)
History and Physical Interval Note:  08/15/2012 4:18 PM  [name] [name]  has presented today for surgery, with the diagnosis of STROKE  The various methods of treatment have been discussed with the patient and family. After consideration of risks, benefits and other options for treatment, the patient has consented to  Procedure(s): TRANSESOPHAGEAL ECHOCARDIOGRAM (TEE) (N/A) as a surgical intervention .  The patient's history has been reviewed, patient examined, no change in status, stable for surgery.  I have reviewed the patient's chart and labs.  Questions were answered to the patient's satisfaction.     Charlton Haws

## 2012-08-15 NOTE — Progress Notes (Signed)
Occupational Therapy Treatment Patient Details Name: [name] [name] MRN: [id number] DOB: [birth year] Today's Date: 08/15/2012 Time: 0865-7846 OT Time Calculation (min): 23 min  OT Assessment / Plan / Recommendation  OT comments  Pt progressing towards goals Pt with deficits in the right periphery, and potentially more. Pt was having problems following one step commands. Pt required visual and auditory stimulis to track pen, and then it was not sustained. Pt asked to draw clock. Pt unsuccessful with RUE movements uncontrolled, and Pt kepy loosing grip of the pen. Pt attempted with the left hand, and Pt able to draw circle and put 4 lines where 12, 3, 6, and 9 would be. Pt demonstrated good problem solving. Pt able to scan length of 8x11.5 sheet of paper for items with head turn down and right to use left eye and central gaze. Pt continues to be an excellent candidate for CIR.   Follow Up Recommendations  CIR          Recommendations for Other Services Rehab consult  Frequency Min 3X/week   Progress towards OT Goals Progress towards OT goals: Progressing toward goals  Plan Discharge plan remains appropriate    Precautions / Restrictions Precautions Precautions: Sternal;Fall Precaution Comments: s/p CABG 2 weeks prior  Restrictions Weight Bearing Restrictions: No   Pertinent Vitals/Pain Pt reported no pain during session    ADL  ADL Comments: Session focused on vision. Pt with deficits in the right periphery, and potentially more. Pt was having problems following one step commands. Pt required visual and auditory stimulis to track pen, and then it was not sustained. Pt asked to draw clock. Pt unsuccessful with RUE movements uncontrolled, and Pt kepy loosing grip of the pen. Pt attempted with the left hand, and Pt able to draw circle and put 4 lines where 12, 3, 6, and 9 would be. Pt demonstrated good problem solving. Pt able to scan length of 8x11.5 sheet of paper for items with head turn  down and right to use left eye and central gaze.      OT Goals(current goals can now be found in the care plan section) Acute Rehab OT Goals Patient Stated Goal: none stated OT Goal Formulation: With patient/family Time For Goal Achievement: 08/28/12 Potential to Achieve Goals: Good ADL Goals Pt Will Perform Grooming: with min guard assist;standing Pt Will Perform Upper Body Dressing: with min guard assist;sitting Pt Will Perform Lower Body Dressing: with min guard assist;sit to/from stand Pt Will Transfer to Toilet: with min guard assist;regular height toilet Pt Will Perform Toileting - Clothing Manipulation and hygiene: with supervision;sit to/from stand  Visit Information  Last OT Received On: 08/15/12 Assistance Needed: +2 (for safety with ambulation) History of Present Illness: 62 y.o. female recovering from coronary artery bypass graft surgery performed approximately 2 weeks ago now presenting with confusion, word finding difficulties, decreased sensation of the right upper extremity and face, and coordination problems with the right upper extremity. MRI shows Large acute left hemisphere infarct affects predominately PCA territory including the posterior temporal lobe, much of the occipital lobe, portions of the parietal lobe, and much of the thalamus and posterior limb internal capsule          Cognition  Cognition Arousal/Alertness: Awake/alert Behavior During Therapy: Impulsive Overall Cognitive Status: Impaired/Different from baseline Area of Impairment: Attention;Memory;Following commands;Awareness Current Attention Level: Alternating Memory: Decreased short-term memory Following Commands: Follows one step commands inconsistently Awareness: Intellectual;Anticipatory;Emergent Problem Solving: Slow processing;Decreased initiation;Difficulty sequencing;Requires verbal cues;Requires tactile cues    Mobility  Bed Mobility Bed Mobility: Supine to Sit;Sit to Supine Supine to  Sit: 4: Min assist;HOB elevated (only min assist because of HOB being elevated.) Sitting - Scoot to Edge of Bed: 3: Mod assist;With rail Sit to Supine: 3: Mod assist;HOB flat;With rail (impulsive) Details for Bed Mobility Assistance: (A) needed to bring pt to upright sitting position on EOB; required HOB elevated and multimodal cues for hand placement and sequencing. Pt very impulsive when going from sit <> supine. required max tactile and vc's from OTS to slow down and be safe with RUE. Transfers Transfers: Not assessed          End of Session OT - End of Session Activity Tolerance: Patient tolerated treatment well Patient left: in bed;with call bell/phone within reach Nurse Communication: Mobility status;Precautions  GO     Sherryl Manges 08/15/2012, 10:38 AM

## 2012-08-15 NOTE — Progress Notes (Signed)
Stroke Team Progress Note  HISTORY [name] [name] is a 62 y.o. female who has been staying with her sister recently while [name] [name] recovers from coronary artery bypass graft surgery 07/25/2012. She apparently awoke this morning 08/13/2012 at approximately 4 AM and was noted to be confused with rambling speech. She was brought to the emergency department at [facility] today by family members for further evaluation.The patient was noted to be having difficulty moving her right side. A CT scan of the head is currently pending; although, the patient is outside the window for TPA. The fact that she recently had coronary artery bypass graft surgery also excludes her from TPA consideration. The patient was taking aspirin 325 mg daily prior to admission. This will be continued. She will be admitted for further evaluation and treatment. Her NIH score was felt to be a 7. Her modified Rankin score was believed to be a 1.  She was admitted for further evaluation and treatment.  SUBJECTIVE Overall she feels her condition is gradually improving. She is ready for her test this afternoon.  OBJECTIVE Most recent Vital Signs: Filed Vitals:   08/14/12 1400 08/14/12 2200 08/15/12 0200 08/15/12 0600  BP: 128/72 183/78 152/64 146/63  Pulse: 56 67 57 57  Temp: 99 F (37.2 C) 99.2 F (37.3 C) 98.4 F (36.9 C) 98.9 F (37.2 C)  TempSrc: Oral     Resp: 17 20 20 20   Height:      Weight:      SpO2: 99% 100% 100% 100%   CBG (last 3)   Recent Labs  08/13/12 1525  GLUCAP 97    IV Fluid Intake:   . sodium chloride 1,000 mL (08/13/12 2015)    MEDICATIONS  . aspirin  300 mg Rectal Daily   Or  . aspirin  325 mg Oral Daily  . atorvastatin  80 mg Oral q1800  . cloNIDine  0.2 mg Oral BID  . gadobenate dimeglumine  20 mL Intravenous Once  . heparin  5,000 Units Subcutaneous Q8H  . lisinopril  10 mg Oral Daily  . metoprolol  50 mg Oral BID   PRN:  senna-docusate  Diet:  NPO  Activity:  OOB with  assistance DVT Prophylaxis:  Heparin 5000 units sq tid   CLINICALLY SIGNIFICANT STUDIES Basic Metabolic Panel:   Recent Labs Lab 08/13/12 1620 08/15/12 0610  NA 136 138  K 3.6 2.8*  CL 96 103  CO2 25 22  GLUCOSE 105* 90  BUN 10 4*  CREATININE 0.63 0.55  CALCIUM 9.9 8.8   Liver Function Tests:   Recent Labs Lab 08/13/12 1620  AST 19  ALT 23  ALKPHOS 77  BILITOT 0.6  PROT 7.5  ALBUMIN 3.3*   CBC:   Recent Labs Lab 08/13/12 1620  WBC 8.8  NEUTROABS 6.8  HGB 11.5*  HCT 34.1*  MCV 93.2  PLT 427*   Coagulation:   Recent Labs Lab 08/13/12 1620  LABPROT 14.0  INR 1.10   Cardiac Enzymes:   Recent Labs Lab 08/13/12 1620 08/14/12 2117  TROPONINI <0.30 <0.30   Urinalysis: No results found for this basename: COLORURINE, APPERANCEUR, LABSPEC, PHURINE, GLUCOSEU, HGBUR, BILIRUBINUR, KETONESUR, PROTEINUR, UROBILINOGEN, NITRITE, LEUKOCYTESUR,  in the last 168 hours Lipid Panel    Component Value Date/Time   CHOL 90 08/14/2012 0635   TRIG 72 08/14/2012 0635   HDL 22* 08/14/2012 0635   CHOLHDL 4.1 08/14/2012 0635   VLDL 14 08/14/2012 0635   LDLCALC 54 08/14/2012 4098  HgbA1C  Lab Results  Component Value Date   HGBA1C 5.4 08/14/2012    Urine Drug Screen:   No results found for this basename: labopia,  cocainscrnur,  labbenz,  amphetmu,  thcu,  labbarb    Alcohol Level: No results found for this basename: ETH,  in the last 168 hours  CT of the brain  08/13/2012  MRI of the brain  08/14/2012    Acute non hemorrhagic left PCA territory infarct affecting most of that vessel's vascular territory. Chronic left parietal MCA territory infarct.   MRA of the brain  08/14/2012    Motion degraded scan.  Severe disease distal left PCA.  Basilar artery grossly patent. Abnormal distal right vertebral.   2D Echocardiogram  EF 55-60% with no source of embolus.   TEE  Carotid Doppler  No evidence of hemodynamically significant internal carotid artery stenosis. Vertebral  artery flow is antegrade.   CXR  08/14/2012    Improving basilar aeration and pleural effusion status post CABG.   EKG  normal sinus rhythm.   Therapy Recommendations CIR  Physical Exam   Pleasant middle-aged African American lady currently not in distress.Awake alert. Afebrile. Head is nontraumatic. Neck is supple without bruit. Hearing is normal. Cardiac exam no murmur or gallop. Lungs are clear to auscultation. Distal pulses are well felt. Neurological Exam ; Awake  Alert oriented x 3. Normal speech and language.eye movements full without nystagmus.fundi were not visualized. Vision acuity  appeasr normal dense right sided homonymous hemianopsia to bedside testing. Diminished recall 1/3. Hearing is normal. Palatal movements are normal. Face symmetric. Tongue midline. Normal strength, tone, reflexes and coordination. Normal sensation. Gait deferred.  ASSESSMENT [name] [name] is a 62 y.o. female presenting with difficulty moving her right side.  Imaging confirms a left PCA territory infarct. Infarct felt to be embolic secondary to unknown source.  On aspirin 325 mg orally every day prior to admission. Now on aspirin 325 mg orally every day for secondary stroke prevention. Patient with resultant right hemiparesis. Work up underway, CIR recommended.  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 TEE to look for embolic source. Followed by Dr. Allyson Sabal at [facility] as an outpatient. They have no one available to do the procedure today. It has been arranged with [facility] for today at 4p.  If positive for PFO (patent foramen ovale), check bilateral lower extremity venous dopplers to rule out DVT as possible source of stroke. If TEE negative, [place] cardiologist will place  implantable loop recorder to evaluate for atrial fibrillation as etiology of stroke. This has been explained to patient/family by Dr. Pearlean Brownie and they are agreeable. Patient ok for discharge to CIR  from neuro standpoint after the procedure(s) this afternoon  Annie Main, MSN, RN, ANVP-BC, ANP-BC, [facility] Pager: [phone] 08/15/2012 10:52 AM  I have personally obtained a history, examined the patient, evaluated imaging results, and formulated the assessment and plan of care. I agree with the above.  Delia Heady, MD

## 2012-08-15 NOTE — Progress Notes (Addendum)
[facility] PROGRESS NOTE  [name] [name] ZOX:[id number] DOB: [birth year] DOA: 08/13/2012 PCP: No primary provider on file.  Assessment/Plan: Acute nonhemorrhagic stroke  -Left posterior cerebral artery distribution  -MRI brain shows severe distal left PCA disease  -Neurology following  -Continue aspirin  -Hemoglobin A1c 5.4  -LDL 54, TSH 0.570  -Discussed with stroke team today (08/14/12)--due to concerns for embolic source--TEE has been ordered  -Stroke team has arranged for TEE 08/15/12 -08/15/2012 TEE--EF of 60%, mild LVH, no LAA thrombus  -implantable loop recorder (Reveal device) placed 08/15/12 due to concerns of undx Afib  -continue PT/OT  -CIR consulted 08/14/12-->hopeful for transfer once stable, cleared medically Encephalopathy -slow improvement each day, but remains confused  -Check UA and urine culture--has not yet to be collected -Serum B12--459 -RBC folate pending -ammonia--26 -uds -troponin neg x 2 Hypertension  -Continue lisinopril, clonidine and metoprolol tartrate  -Increase lisinopril to 20 mg daily -Borderline slow heart rate has limited increase of metoprolol tartrate and clonidine Hyperlipidemia  -Continue Lipitor  Tobacco abuse  -Tobacco cessation discussed  Coronary artery disease  -Status post CABG x5 vessels 07/25/2012--Dr. Dorris Fetch  -no post-CABG Afib from review of records  Family Communication:   Pt at beside Disposition Plan:   CIR        Procedures/Studies: Dg Chest 2 View  08/14/2012   *RADIOLOGY REPORT*  Clinical Data: Right arm weakness.  Stroke.  CHEST - 2 VIEW  Comparison: 07/28/2012 and 07/24/2012 radiographs.  Findings: The heart size is stable status post CABG.  The pleural effusions and bibasilar atelectasis have improved.  There is no edema or pneumothorax.  The osseous structures appear unchanged.  IMPRESSION: Improving basilar aeration and pleural effusion status post CABG.   Original Report Authenticated By: Carey Bullocks, M.D.   Dg Chest 2 View  07/28/2012   *RADIOLOGY REPORT*  Clinical Data: 62 year old female status post cardiac surgery.  ST elevation myocardial infarction.  Hypertension.  CHEST - 2 VIEW  Comparison: 07/27/2012 and earlier.  Findings: Right IJ introducer sheath has been removed.  Epicardial pacer wires remain in place.  Sequelae of CABG.  Stable cardiac size and mediastinal contours.  Small to moderate bilateral pleural effusions with fluid in the fissures and perihilar and basilar atelectasis.  No pulmonary edema or pneumothorax. Stable visualized osseous structures.  IMPRESSION: 1.  Right IJ introducer sheath removed.  Epicardial pacer wires remain. 2.  Small to moderate bilateral pleural effusions with atelectasis.   Original Report Authenticated By: Erskine Speed, M.D.   Dg Chest 2 View  07/24/2012   *RADIOLOGY REPORT*  Clinical Data: Pre CABG.  CHEST - 2 VIEW  Comparison: None.  Findings: Trachea is midline.  Heart size normal.  Lungs appear mildly hyperinflated but otherwise clear.  No pleural fluid.  IMPRESSION: No acute findings.   Original Report Authenticated By: Leanna Battles, M.D.   Ct Head (brain) Wo Contrast  08/13/2012   *RADIOLOGY REPORT*  Clinical Data: Altered mental status.  CT HEAD WITHOUT CONTRAST  Technique:  Contiguous axial images were obtained from the base of the skull through the vertex without contrast.  Comparison: None.  Findings: There is decreased attenuation in the left PCA territory with appearance most consistent with acute/subacute infarct. Remote left parietal infarct is also identified.  No hemorrhage, midline shift or hydrocephalus is seen. There is no pneumocephalus. Imaged paranasal sinuses and mastoid air cells are clear.  IMPRESSION:  1.  Findings compatible with acute/subacute infarct in the left PCA territory. 2.  Remote  left parietal infarct. 3.  Critical Value/emergent results were called by telephone at the time of interpretation on 08/13/2012 at 1534  hours to Dr. Devoria Albe, who verbally acknowledged these results.   Original Report Authenticated By: Holley Dexter, M.D.   Mr Brain Wo Contrast  08/14/2012   *RADIOLOGY REPORT*  Clinical Data:  Right-sided weakness.  Stroke risk factors include hypertension, hyperlipidemia, prior myocardial infarction, and tobacco abuse.  MRI HEAD WITHOUT CONTRAST MRA HEAD WITHOUT CONTRAST  Technique:  Multiplanar, multiecho pulse sequences of the brain and surrounding structures were obtained without intravenous contrast. Angiographic images of the head were obtained using MRA technique without contrast.  Comparison:  CT head 08/13/2012.  MRI HEAD  Findings:  Large acute left hemisphere infarct affects predominately  PCA territory including the posterior temporal lobe, much of the occipital lobe, portions of the parietal lobe, and much of the thalamus and posterior limb internal capsule, all on the left. There is no acute or subacute hemorrhage.  There is no midline shift.  There is a remote left parietal cortical and subcortical infarct with associated gliosis and encephalomalacia.  This is associated with slight chronic gyral microhemorrhage.  This infarct lies within the left MCA distribution.  There is mild atrophy.  There is mild chronic microvascular ischemic change.  Normal pituitary.    Negative osseous structures. Extracranial soft tissues unremarkable.  IMPRESSION: Acute non hemorrhagic left PCA territory infarct affecting most of that vessel's vascular territory. Chronic left parietal MCA territory infarct.  MRA HEAD  Findings: Significant motion degradation.  Gross patency of the carotid arteries is established.  The basilar artery is patent with left vertebral dominant. The left vertebral is occluded except for its distal most segment.  This could fill via retrograde flow from the basilar.  There is no appreciable flow related enhancement of the right vertebral at the skull base.  There is no proximal flow  limiting stenosis of the middle cerebral arteries.  Focal narrowing at the origin of the right anterior cerebral artery could be 75%. Severe disease bilateral PCA vessels, greater on the left. Moderate irregularity distal MCA vessels bilaterally.  IMPRESSION: Motion degraded scan.  Severe disease distal left PCA.  Basilar artery grossly patent. Abnormal distal right vertebral.   Original Report Authenticated By: Davonna Belling, M.D.   Dg Chest Port 1 View  07/27/2012   *RADIOLOGY REPORT*  Clinical Data: Post CABG  PORTABLE CHEST - 1 VIEW  Comparison: 07/26/2012; 07/25/2012  Findings: Grossly unchanged cardiac silhouette and mediastinal contours post median sternotomy and CABG.  Atherosclerotic calcifications within the thoracic aorta.  Interval removal of mediastinal drain and left-sided chest tube.  Interval removal of PA catheter with remaining vascular sheath tip overlying the mid SVC.  No pneumothorax.  Overall improved aeration of the lungs with persistent bibasilar heterogeneous opacities.  Unchanged trace bilateral effusions.  Unchanged bones.  IMPRESSION: 1.  Interval removal of support apparatus as above.  No pneumothorax. 2.  Improved pulmonary edema. 3.  Persistent trace bilateral effusions and bibasilar atelectasis.   Original Report Authenticated By: Tacey Ruiz, MD   Dg Chest Portable 1 View In Am  07/26/2012   *RADIOLOGY REPORT*  Clinical Data: Post CABG  PORTABLE CHEST - 1 VIEW  Comparison: Portable exam 0708 hours compared to 06/24 1014  Findings: Endotracheal and nasogastric tubes removed. Mediastinal drain and left thoracostomy tube unchanged. Tip of right jugular Swan-Ganz catheter projects over distal main pulmonary artery. Epicardial pacing leads noted.  Normal heart size post  CABG. Atherosclerotic calcification aorta. Perihilar interstitial infiltrates likely edema. No gross pleural effusion or pneumothorax.  IMPRESSION: Mild pulmonary edema. Line and tube positions as above.   Original  Report Authenticated By: Ulyses Southward, M.D.   Dg Chest Portable 1 View  07/25/2012   *RADIOLOGY REPORT*  Clinical Data: Postop CABG.  PORTABLE CHEST - 1 VIEW  Comparison: 07/24/2012.  Findings: Endotracheal tube tip 4.5 cm above the carina.  Right-sided Swan-Ganz catheter tip main pulmonary outflow tract.  Mediastinal drains and left-sided chest tube in place.  No gross pneumothorax.  Epicardial leads are in place.  Radiopaque structure aortic pulmonary window region consistent with surgical clips.  Heart size within normal limits.  Mild pulmonary vascular prominence.  Nasogastric tube courses below the diaphragm.  The tip is not included on this exam.  IMPRESSION: Endotracheal tube tip 4.5 cm above the carina.  Right-sided Swan-Ganz catheter tip main pulmonary outflow tract.  Mediastinal drains and left-sided chest tube in place.  No gross pneumothorax.  Mild pulmonary vascular prominence.   Original Report Authenticated By: Lacy Duverney, M.D.   Mr Mra Head/brain Wo Cm  08/14/2012   *RADIOLOGY REPORT*  Clinical Data:  Right-sided weakness.  Stroke risk factors include hypertension, hyperlipidemia, prior myocardial infarction, and tobacco abuse.  MRI HEAD WITHOUT CONTRAST MRA HEAD WITHOUT CONTRAST  Technique:  Multiplanar, multiecho pulse sequences of the brain and surrounding structures were obtained without intravenous contrast. Angiographic images of the head were obtained using MRA technique without contrast.  Comparison:  CT head 08/13/2012.  MRI HEAD  Findings:  Large acute left hemisphere infarct affects predominately  PCA territory including the posterior temporal lobe, much of the occipital lobe, portions of the parietal lobe, and much of the thalamus and posterior limb internal capsule, all on the left. There is no acute or subacute hemorrhage.  There is no midline shift.  There is a remote left parietal cortical and subcortical infarct with associated gliosis and encephalomalacia.  This is associated  with slight chronic gyral microhemorrhage.  This infarct lies within the left MCA distribution.  There is mild atrophy.  There is mild chronic microvascular ischemic change.  Normal pituitary.    Negative osseous structures. Extracranial soft tissues unremarkable.  IMPRESSION: Acute non hemorrhagic left PCA territory infarct affecting most of that vessel's vascular territory. Chronic left parietal MCA territory infarct.  MRA HEAD  Findings: Significant motion degradation.  Gross patency of the carotid arteries is established.  The basilar artery is patent with left vertebral dominant. The left vertebral is occluded except for its distal most segment.  This could fill via retrograde flow from the basilar.  There is no appreciable flow related enhancement of the right vertebral at the skull base.  There is no proximal flow limiting stenosis of the middle cerebral arteries.  Focal narrowing at the origin of the right anterior cerebral artery could be 75%. Severe disease bilateral PCA vessels, greater on the left. Moderate irregularity distal MCA vessels bilaterally.  IMPRESSION: Motion degraded scan.  Severe disease distal left PCA.  Basilar artery grossly patent. Abnormal distal right vertebral.   Original Report Authenticated By: Davonna Belling, M.D.         Subjective: Patient is pleasantly confused. Denies any fevers, chills, headache, dizziness, visual disturbance, chest pain, shortness breath, nausea, vomiting, diarrhea, abdominal pain. Still complains of right arm and right leg weakness.  Objective: Filed Vitals:   08/15/12 1625 08/15/12 1630 08/15/12 1635 08/15/12 1740  BP: 172/87 168/90 170/90 153/77  Pulse:  61  Temp:    98.6 F (37 C)  TempSrc:    Oral  Resp: 20 18 24 16   Height:      Weight:      SpO2: 99% 97% 99% 100%    Intake/Output Summary (Last 24 hours) at 08/15/12 1858 Last data filed at 08/15/12 1600  Gross per 24 hour  Intake    250 ml  Output      0 ml  Net    250 ml    Weight change:  Exam:   General:  Pt is alert, follows commands appropriately, not in acute distress  HEENT: No icterus, No thrush,  [place]/AT  Cardiovascular: RRR, S1/S2, no rubs, no gallops  Respiratory: CTA bilaterally, no wheezing, no crackles, no rhonchi  Abdomen: Soft/+BS, non tender, non distended, no guarding  Extremities: No edema, No lymphangitis, No petechiae, No rashes, no synovitis  Data Reviewed: Basic Metabolic Panel:  Recent Labs Lab 08/13/12 1620 08/15/12 0610  NA 136 138  K 3.6 2.8*  CL 96 103  CO2 25 22  GLUCOSE 105* 90  BUN 10 4*  CREATININE 0.63 0.55  CALCIUM 9.9 8.8   Liver Function Tests:  Recent Labs Lab 08/13/12 1620  AST 19  ALT 23  ALKPHOS 77  BILITOT 0.6  PROT 7.5  ALBUMIN 3.3*   No results found for this basename: LIPASE, AMYLASE,  in the last 168 hours  Recent Labs Lab 08/15/12 0600  AMMONIA 26   CBC:  Recent Labs Lab 08/13/12 1620  WBC 8.8  NEUTROABS 6.8  HGB 11.5*  HCT 34.1*  MCV 93.2  PLT 427*   Cardiac Enzymes:  Recent Labs Lab 08/13/12 1620 08/14/12 2117  TROPONINI <0.30 <0.30   BNP: No components found with this basename: POCBNP,  CBG:  Recent Labs Lab 08/13/12 1525  GLUCAP 97    No results found for this or any previous visit (from the past 240 hour(s)).   Scheduled Meds: . aspirin  300 mg Rectal Daily   Or  . aspirin  325 mg Oral Daily  . atorvastatin  80 mg Oral q1800  . cloNIDine  0.2 mg Oral BID  . gadobenate dimeglumine  20 mL Intravenous Once  . heparin  5,000 Units Subcutaneous Q8H  . lisinopril  10 mg Oral Daily  . metoprolol  50 mg Oral BID   Continuous Infusions: . sodium chloride 1,000 mL (08/13/12 2015)     Letasha Kershaw, DO  [facility] Pager [phone]  If 7PM-7AM, please contact night-coverage [email or web address] Password [facility] 08/15/2012, 6:58 PM   LOS: 2 days

## 2012-08-15 NOTE — Progress Notes (Signed)
I agree with the following treatment note after reviewing documentation.   Johnston, Takeria Marquina Brynn   OTR/L Pager: [phone] Office: [phone] .   

## 2012-08-15 NOTE — H&P (View-Only) (Signed)
ELECTROPHYSIOLOGY CONSULT NOTE    Patient ID: [name] [name] MRN: [id number], DOB/AGE: 1950-05-05 62 y.o.  Admit date: 08/13/2012 Date of Consult: 08-15-2012  Referring Physician: Pearlean Brownie  Reason for Consultation: cryptogenic stroke  HPI:  [name] [name] is a 62 year old female who was recently admitted with a STEMI and underwent CABG last month.  She has been staying with her sister since discharge.  On 08-13-2012, she developed confusion with rambling speech and was brought to the ER. She has been found to have a left PCA territory infarct that is felt to be embolic.  TEE is pending today.  Telemetry while hospitalized has demonstrated sinus rhythm with no atrial arrhythmias.   EP has been asked to evaluate for placement of an ILR.   Past Medical History  Diagnosis Date  . Hypertension   . Hyperlipidemia LDL goal < 70 07/24/2012  . STEMI (ST elevation myocardial infarction), secondary to disease of RCA, with disease of LAD and diag. branch and chronic non dominant LCX occlusion for CABG  07/21/2012  . 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 07/25/12 07/28/2012    POSTOPERATIVE DIAGNOSIS: Severe 3-vessel coronary disease, status post  myocardial infarction.  PROCEDURE: Median sternotomy, extracorporeal circulation, coronary  artery bypass grafting x5 (left internal mammary artery to LAD,  sequential saphenous vein graft to obtuse marginal 1, obtuse marginal 2,  and posterior descending with Y graft to 1st diagonal), endoscopic vein  harvest, left thigh.    . Tobacco abuse 07/21/2012     Surgical History:  Past Surgical History  Procedure Laterality Date  . Coronary artery bypass graft N/A 07/25/2012    Procedure: CORONARY ARTERY BYPASS GRAFTING (CABG) times five with Left  Endoscopic Saphenous Vein Harvest and Left Internal  Mammary ;  Surgeon: Loreli Slot, MD;  Location: [facility] OR;  Service: Open Heart Surgery;  Laterality: N/A;     Prescriptions prior to admission    Medication Sig Dispense Refill  . aspirin EC 325 MG tablet Take 325 mg by mouth daily.      Marland Kitchen atorvastatin (LIPITOR) 80 MG tablet Take 80 mg by mouth daily.      . cloNIDine (CATAPRES) 0.2 MG tablet Take 0.2 mg by mouth 2 (two) times daily.      Marland Kitchen lisinopril (PRINIVIL,ZESTRIL) 10 MG tablet Take 10 mg by mouth daily.      . metoprolol (LOPRESSOR) 50 MG tablet Take 50 mg by mouth 2 (two) times daily.      . traMADol (ULTRAM) 50 MG tablet Take 50-100 mg by mouth every 6 (six) hours as needed for pain.        Inpatient Medications:  . aspirin  300 mg Rectal Daily   Or  . aspirin  325 mg Oral Daily  . atorvastatin  80 mg Oral q1800  . cloNIDine  0.2 mg Oral BID  . gadobenate dimeglumine  20 mL Intravenous Once  . heparin  5,000 Units Subcutaneous Q8H  . lisinopril  10 mg Oral Daily  . metoprolol  50 mg Oral BID    Allergies: No Known Allergies  History   Social History  . Marital Status: Single    Spouse Name: N/A    Number of Children: N/A  . Years of Education: N/A   Occupational History  . Not on file.   Social History Main Topics  . Smoking status: Current Every Day Smoker -- 0.25 packs/day    Types: Cigarettes  . Smokeless tobacco: Not on  file  . Alcohol Use: No  . Drug Use: No  . Sexually Active: Not Currently   Other Topics Concern  . Not on file   Social History Narrative  . No narrative on file     Family History  Problem Relation Age of Onset  . Hypertension Mother   . Stroke Brother     Died at age 40 secondary to MI  . CAD Brother     Physical Exam: Filed Vitals:   08/14/12 1400 08/14/12 2200 08/15/12 0200 08/15/12 0600  BP: 128/72 183/78 152/64 146/63  Pulse: 56 67 57 57  Temp: 99 F (37.2 C) 99.2 F (37.3 C) 98.4 F (36.9 C) 98.9 F (37.2 C)  TempSrc: Oral     Resp: 17 20 20 20   Height:      Weight:      SpO2: 99% 100% 100% 100%    GEN- The patient is well appearing, alert but confused Head- normocephalic, atraumatic Eyes-  Sclera  clear, conjunctiva pink Ears- hearing intact Oropharynx- clear Neck- supple, no JVP Lymph- no cervical lymphadenopathy Lungs- Clear to ausculation bilaterally, normal work of breathing Heart- Regular rate and rhythm, no murmurs, rubs or gallops, PMI not laterally displaced GI- soft, NT, ND, + BS Extremities- no clubbing, cyanosis, or edema  Labs:   Lab Results  Component Value Date   WBC 8.8 08/13/2012   HGB 11.5* 08/13/2012   HCT 34.1* 08/13/2012   MCV 93.2 08/13/2012   PLT 427* 08/13/2012    Recent Labs Lab 08/13/12 1620 08/15/12 0610  NA 136 138  K 3.6 2.8*  CL 96 103  CO2 25 22  BUN 10 4*  CREATININE 0.63 0.55  CALCIUM 9.9 8.8  PROT 7.5  --   BILITOT 0.6  --   ALKPHOS 77  --   ALT 23  --   AST 19  --   GLUCOSE 105* 90   Lab Results  Component Value Date   TROPONINI <0.30 08/14/2012   Lab Results  Component Value Date   CHOL 90 08/14/2012   CHOL 187 07/22/2012   Lab Results  Component Value Date   HDL 22* 08/14/2012   HDL 50 07/22/2012   Lab Results  Component Value Date   LDLCALC 54 08/14/2012   LDLCALC 120* 07/22/2012   Lab Results  Component Value Date   TRIG 72 08/14/2012   TRIG 87 07/22/2012   Lab Results  Component Value Date   CHOLHDL 4.1 08/14/2012   CHOLHDL 3.7 07/22/2012     Radiology/Studies: Dg Chest 2 View 08/14/2012   *RADIOLOGY REPORT*  Clinical Data: Right arm weakness.  Stroke.  CHEST - 2 VIEW  Comparison: 07/28/2012 and 07/24/2012 radiographs.  Findings: The heart size is stable status post CABG.  The pleural effusions and bibasilar atelectasis have improved.  There is no edema or pneumothorax.  The osseous structures appear unchanged.  IMPRESSION: Improving basilar aeration and pleural effusion status post CABG.   Original Report Authenticated By: Carey Bullocks, M.D.   Mr Brain Wo Contrast 08/14/2012   *RADIOLOGY REPORT*  Clinical Data:  Right-sided weakness.  Stroke risk factors include hypertension, hyperlipidemia, prior myocardial  infarction, and tobacco abuse.  MRI HEAD WITHOUT CONTRAST MRA HEAD WITHOUT CONTRAST  Technique:  Multiplanar, multiecho pulse sequences of the brain and surrounding structures were obtained without intravenous contrast. Angiographic images of the head were obtained using MRA technique without contrast.  Comparison:  CT head 08/13/2012.  MRI HEAD  Findings:  Large  acute left hemisphere infarct affects predominately  PCA territory including the posterior temporal lobe, much of the occipital lobe, portions of the parietal lobe, and much of the thalamus and posterior limb internal capsule, all on the left. There is no acute or subacute hemorrhage.  There is no midline shift.  There is a remote left parietal cortical and subcortical infarct with associated gliosis and encephalomalacia.  This is associated with slight chronic gyral microhemorrhage.  This infarct lies within the left MCA distribution.  There is mild atrophy.  There is mild chronic microvascular ischemic change.  Normal pituitary.    Negative osseous structures. Extracranial soft tissues unremarkable.  IMPRESSION: Acute non hemorrhagic left PCA territory infarct affecting most of that vessel's vascular territory. Chronic left parietal MCA territory infarct.  MRA HEAD  Findings: Significant motion degradation.  Gross patency of the carotid arteries is established.  The basilar artery is patent with left vertebral dominant. The left vertebral is occluded except for its distal most segment.  This could fill via retrograde flow from the basilar.  There is no appreciable flow related enhancement of the right vertebral at the skull base.  There is no proximal flow limiting stenosis of the middle cerebral arteries.  Focal narrowing at the origin of the right anterior cerebral artery could be 75%. Severe disease bilateral PCA vessels, greater on the left. Moderate irregularity distal MCA vessels bilaterally.  IMPRESSION: Motion degraded scan.  Severe disease distal  left PCA.  Basilar artery grossly patent. Abnormal distal right vertebral.   Original Report Authenticated By: Davonna Belling, M.D.   RUE:AVWUJ rhythm with RBBB  TELEMETRY: sinus rhythm with no atrial arrhythmias  Assessment and Plan:  1. Cryptogenic stroke The patient presents with cryptogenic stroke.  The patient has a TEE planned for later today.  I spoke at length with the patient and her sister about monitoring for afib with either a 30 day event monitor or an implantable loop recorder.  Risks, benefits, and alteratives to implantable loop recorder were discussed with the patient today.   At this time, the patient is very clear in their decision to proceed with implantable loop recorder.   Please call with questions.

## 2012-08-16 ENCOUNTER — Encounter (HOSPITAL_COMMUNITY): Payer: Self-pay | Admitting: Cardiovascular Disease

## 2012-08-16 ENCOUNTER — Inpatient Hospital Stay (HOSPITAL_COMMUNITY)
Admission: RE | Admit: 2012-08-16 | Discharge: 2012-08-26 | DRG: 945 | Disposition: A | Discharge status: Home Health | Payer: Medicaid Other | Source: Intra-hospital | Attending: Physical Medicine & Rehabilitation | Admitting: Physical Medicine & Rehabilitation

## 2012-08-16 DIAGNOSIS — I6340 Cerebral infarction due to embolism of unspecified cerebral artery: Secondary | ICD-10-CM

## 2012-08-16 DIAGNOSIS — Z79899 Other long term (current) drug therapy: Secondary | ICD-10-CM

## 2012-08-16 DIAGNOSIS — Z951 Presence of aortocoronary bypass graft: Secondary | ICD-10-CM

## 2012-08-16 DIAGNOSIS — E876 Hypokalemia: Secondary | ICD-10-CM

## 2012-08-16 DIAGNOSIS — I252 Old myocardial infarction: Secondary | ICD-10-CM

## 2012-08-16 DIAGNOSIS — I10 Essential (primary) hypertension: Secondary | ICD-10-CM

## 2012-08-16 DIAGNOSIS — Z5189 Encounter for other specified aftercare: Principal | ICD-10-CM

## 2012-08-16 DIAGNOSIS — Z823 Family history of stroke: Secondary | ICD-10-CM

## 2012-08-16 DIAGNOSIS — Z8249 Family history of ischemic heart disease and other diseases of the circulatory system: Secondary | ICD-10-CM

## 2012-08-16 DIAGNOSIS — F17200 Nicotine dependence, unspecified, uncomplicated: Secondary | ICD-10-CM

## 2012-08-16 DIAGNOSIS — I2510 Atherosclerotic heart disease of native coronary artery without angina pectoris: Secondary | ICD-10-CM

## 2012-08-16 DIAGNOSIS — K5900 Constipation, unspecified: Secondary | ICD-10-CM

## 2012-08-16 DIAGNOSIS — R4701 Aphasia: Secondary | ICD-10-CM

## 2012-08-16 DIAGNOSIS — Z7982 Long term (current) use of aspirin: Secondary | ICD-10-CM

## 2012-08-16 DIAGNOSIS — R279 Unspecified lack of coordination: Secondary | ICD-10-CM

## 2012-08-16 DIAGNOSIS — R29898 Other symptoms and signs involving the musculoskeletal system: Secondary | ICD-10-CM

## 2012-08-16 DIAGNOSIS — E785 Hyperlipidemia, unspecified: Secondary | ICD-10-CM

## 2012-08-16 LAB — URINALYSIS W MICROSCOPIC + REFLEX CULTURE
Glucose, UA: NEGATIVE mg/dL
Hgb urine dipstick: NEGATIVE
Ketones, ur: 15 mg/dL — AB
Protein, ur: NEGATIVE mg/dL

## 2012-08-16 LAB — CBC
HCT: 30.2 % — ABNORMAL LOW (ref 36.0–46.0)
Hemoglobin: 10.3 g/dL — ABNORMAL LOW (ref 12.0–15.0)
Hemoglobin: 10.3 g/dL — ABNORMAL LOW (ref 12.0–15.0)
MCH: 31.3 pg (ref 26.0–34.0)
MCH: 31.1 pg (ref 26.0–34.0)
MCHC: 34.1 g/dL (ref 30.0–36.0)
MCV: 91.8 fL (ref 78.0–100.0)
Platelets: 358 10*3/uL (ref 150–400)
RBC: 3.29 MIL/uL — ABNORMAL LOW (ref 3.87–5.11)
RBC: 3.31 MIL/uL — ABNORMAL LOW (ref 3.87–5.11)
RDW: 14.9 % (ref 11.5–15.5)
WBC: 8 10*3/uL (ref 4.0–10.5)

## 2012-08-16 LAB — RAPID URINE DRUG SCREEN, HOSP PERFORMED
Barbiturates: NOT DETECTED
Opiates: NOT DETECTED
Tetrahydrocannabinol: NOT DETECTED

## 2012-08-16 LAB — BASIC METABOLIC PANEL
BUN: 5 mg/dL — ABNORMAL LOW (ref 6–23)
CO2: 24 mEq/L (ref 19–32)
Calcium: 8.8 mg/dL (ref 8.4–10.5)
Creatinine, Ser: 0.58 mg/dL (ref 0.50–1.10)

## 2012-08-16 LAB — CREATININE, SERUM
Creatinine, Ser: 0.52 mg/dL (ref 0.50–1.10)
GFR calc non Af Amer: >90 mL/min (ref >90)

## 2012-08-16 LAB — FOLATE RBC: RBC Folate: 1015 ng/mL — ABNORMAL HIGH (ref >=366)

## 2012-08-16 MED ORDER — SENNOSIDES-DOCUSATE SODIUM 8.6-50 MG PO TABS: 1.0000 | ORAL_TABLET | Freq: Every evening | ORAL | Status: DC | PRN

## 2012-08-16 MED ORDER — LISINOPRIL 20 MG PO TABS
20.0000 mg | ORAL_TABLET | Freq: Every day | ORAL | Status: DC
  Administered 2012-08-17 – 2012-08-19 (×3): 20 mg via ORAL
  Filled 2012-08-16 (×5): qty 1

## 2012-08-16 MED ORDER — HEPARIN SODIUM (PORCINE) 5000 UNIT/ML IJ SOLN: 5000.0000 [IU] | Freq: Three times a day (TID) | INTRAMUSCULAR | Status: DC

## 2012-08-16 MED ORDER — ASPIRIN 325 MG PO TABS
325.0000 mg | ORAL_TABLET | Freq: Every day | ORAL | Status: DC
  Administered 2012-08-17 – 2012-08-26 (×10): 325 mg via ORAL
  Filled 2012-08-16 (×12): qty 1

## 2012-08-16 MED ORDER — ATORVASTATIN CALCIUM 80 MG PO TABS
80.0000 mg | ORAL_TABLET | Freq: Every day | ORAL | Status: DC
  Administered 2012-08-16 – 2012-08-25 (×10): 80 mg via ORAL
  Filled 2012-08-16 (×11): qty 1

## 2012-08-16 MED ORDER — SORBITOL 70 % SOLN
30.0000 mL | Freq: Every day | Status: DC | PRN
  Administered 2012-08-23: 30 mL via ORAL
  Filled 2012-08-16 (×2): qty 30

## 2012-08-16 MED ORDER — HEPARIN SODIUM (PORCINE) 5000 UNIT/ML IJ SOLN
5000.0000 [IU] | Freq: Three times a day (TID) | INTRAMUSCULAR | Status: DC
  Administered 2012-08-16 – 2012-08-26 (×29): 5000 [IU] via SUBCUTANEOUS
  Filled 2012-08-16 (×32): qty 1

## 2012-08-16 MED ORDER — POTASSIUM CHLORIDE CRYS ER 20 MEQ PO TBCR
60.0000 meq | EXTENDED_RELEASE_TABLET | Freq: Once | ORAL | Status: DC
  Filled 2012-08-16 (×2): qty 3

## 2012-08-16 MED ORDER — CLONIDINE HCL 0.2 MG PO TABS
0.2000 mg | ORAL_TABLET | Freq: Two times a day (BID) | ORAL | Status: DC
  Administered 2012-08-16 – 2012-08-24 (×16): 0.2 mg via ORAL
  Filled 2012-08-16 (×19): qty 1

## 2012-08-16 MED ORDER — ACETAMINOPHEN 325 MG PO TABS
325.0000 mg | ORAL_TABLET | ORAL | Status: DC | PRN
  Administered 2012-08-17 – 2012-08-23 (×6): 650 mg via ORAL
  Filled 2012-08-16 (×7): qty 2

## 2012-08-16 MED ORDER — METOPROLOL TARTRATE 50 MG PO TABS
50.0000 mg | ORAL_TABLET | Freq: Two times a day (BID) | ORAL | Status: DC
Start: 2012-08-16 — End: 2012-08-26
  Administered 2012-08-16 – 2012-08-26 (×20): 50 mg via ORAL
  Filled 2012-08-16 (×23): qty 1

## 2012-08-16 MED ORDER — ONDANSETRON HCL 4 MG PO TABS: 4.0000 mg | ORAL_TABLET | Freq: Four times a day (QID) | ORAL | Status: DC | PRN

## 2012-08-16 MED ORDER — ONDANSETRON HCL 4 MG/2ML IJ SOLN: 4.0000 mg | Freq: Four times a day (QID) | INTRAMUSCULAR | Status: DC | PRN

## 2012-08-16 NOTE — Progress Notes (Signed)
Stroke Team Progress Note  HISTORY [name] [name] is a 62 y.o. female who has been staying with her sister recently while [name] [name] recovers from coronary artery bypass graft surgery 07/25/2012. She apparently awoke this morning 08/13/2012 at approximately 4 AM and was noted to be confused with rambling speech. She was brought to the emergency department at [facility] today by family members for further evaluation.The patient was noted to be having difficulty moving her right side. A CT scan of the head is currently pending; although, the patient is outside the window for TPA. The fact that she recently had coronary artery bypass graft surgery also excludes her from TPA consideration. The patient was taking aspirin 325 mg daily prior to admission. This will be continued. She will be admitted for further evaluation and treatment. Her NIH score was felt to be a 7. Her modified Rankin score was believed to be a 1.  She was admitted for further evaluation and treatment.  SUBJECTIVE Overall she feels her condition is gradually improving. Had loop recorder. TEE unremarkabable  OBJECTIVE Most recent Vital Signs: Filed Vitals:   08/16/12 0130 08/16/12 0556 08/16/12 0942 08/16/12 1400  BP: 156/86 151/78 174/77 151/74  Pulse: 69 61 65 68  Temp: 97.4 F (36.3 C) 97.6 F (36.4 C) 98.6 F (37 C) 97.6 F (36.4 C)  TempSrc: Oral Oral Oral Oral  Resp: 18 18 19 18   Height:      Weight:      SpO2: 96% 98% 100% 98%   CBG (last 3)   Recent Labs  08/13/12 1525  GLUCAP 97    IV Fluid Intake:      MEDICATIONS  . aspirin  325 mg Oral Daily  . atorvastatin  80 mg Oral q1800  . cloNIDine  0.2 mg Oral BID  . gadobenate dimeglumine  20 mL Intravenous Once  . heparin  5,000 Units Subcutaneous Q8H  . lisinopril  20 mg Oral Daily  . metoprolol  50 mg Oral BID  . potassium chloride  60 mEq Oral Once   PRN:  senna-docusate  Diet:  Cardiac  Activity:  OOB with assistance DVT Prophylaxis:   Heparin 5000 units sq tid   CLINICALLY SIGNIFICANT STUDIES Basic Metabolic Panel:   Recent Labs Lab 08/15/12 0610 08/16/12 0455  NA 138 141  K 2.8* 2.8*  CL 103 106  CO2 22 24  GLUCOSE 90 118*  BUN 4* 5*  CREATININE 0.55 0.58  CALCIUM 8.8 8.8   Liver Function Tests:   Recent Labs Lab 08/13/12 1620  AST 19  ALT 23  ALKPHOS 77  BILITOT 0.6  PROT 7.5  ALBUMIN 3.3*   CBC:   Recent Labs Lab 08/13/12 1620 08/16/12 0455  WBC 8.8 8.0  NEUTROABS 6.8  --   HGB 11.5* 10.3*  HCT 34.1* 30.2*  MCV 93.2 91.8  PLT 427* 358   Coagulation:   Recent Labs Lab 08/13/12 1620  LABPROT 14.0  INR 1.10   Cardiac Enzymes:   Recent Labs Lab 08/13/12 1620 08/14/12 2117  TROPONINI <0.30 <0.30   Urinalysis:   Recent Labs Lab 08/16/12 0205  COLORURINE AMBER*  LABSPEC 1.020  PHURINE 6.5  GLUCOSEU NEGATIVE  HGBUR NEGATIVE  BILIRUBINUR MODERATE*  KETONESUR 15*  PROTEINUR NEGATIVE  UROBILINOGEN 4.0*  NITRITE NEGATIVE  LEUKOCYTESUR LARGE*   Lipid Panel    Component Value Date/Time   CHOL 90 08/14/2012 0635   TRIG 72 08/14/2012 0635   HDL 22* 08/14/2012 4098  CHOLHDL 4.1 08/14/2012 0635   VLDL 14 08/14/2012 0635   LDLCALC 54 08/14/2012 0635   HgbA1C  Lab Results  Component Value Date   HGBA1C 5.4 08/14/2012    Urine Drug Screen:      Component Value Date/Time   LABOPIA NONE DETECTED 08/16/2012 0205    Alcohol Level: No results found for this basename: ETH,  in the last 168 hours  CT of the brain  08/13/2012  MRI of the brain  08/14/2012    Acute non hemorrhagic left PCA territory infarct affecting most of that vessel's vascular territory. Chronic left parietal MCA territory infarct.   MRA of the brain  08/14/2012    Motion degraded scan.  Severe disease distal left PCA.  Basilar artery grossly patent. Abnormal distal right vertebral.   2D Echocardiogram  EF 55-60% with no source of embolus.   TEE no ASD or LAA thrombus  Carotid Doppler  No evidence of  hemodynamically significant internal carotid artery stenosis. Vertebral artery flow is antegrade.   CXR  08/14/2012    Improving basilar aeration and pleural effusion status post CABG.   EKG  normal sinus rhythm.   Therapy Recommendations CIR  Physical Exam   Pleasant middle-aged African American lady currently not in distress.Awake alert. Afebrile. Head is nontraumatic. Neck is supple without bruit. Hearing is normal. Cardiac exam no murmur or gallop. Lungs are clear to auscultation. Distal pulses are well felt. Neurological Exam ; Awake  Alert oriented x 3. Normal speech and language.eye movements full without nystagmus.fundi were not visualized. Vision acuity  appears normal dense right sided homonymous hemianopsia to bedside testing. Diminished recall 1/3. Hearing is normal. Palatal movements are normal. Face symmetric. Tongue midline. Normal strength, tone, reflexes and coordination. Normal sensation. Gait deferred.  ASSESSMENT [name] is a 62 y.o. female presenting with difficulty moving her right side.  Imaging confirms a left PCA territory infarct. Infarct felt to be embolic secondary to unknown source.  On aspirin 325 mg orally every day prior to admission. Now on aspirin 325 mg orally every day for secondary stroke prevention. Patient with resultant right hemiparesis. Work up underway, CIR recommended.  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 Patient ok for discharge to CIR  from neuro standpoint  Follow up with Dr. Pearlean Brownie in 2 months. Follow up with Dr. Johney Frame as directed  [name] [name], [facility], MBA, MHA [facility] Pager: [phone] 08/16/2012 2:56 PM  I have personally  obtained a history, examined the patient, evaluated imaging results, and formulated the assessment and plan of care. I agree with the above.  Delia Heady, MD

## 2012-08-16 NOTE — Interval H&P Note (Signed)
[name] [name] was admitted today to Inpatient Rehabilitation with the diagnosis of embolic left PCA CVA.  The patient's history has been reviewed, patient examined, and there is no change in status.  Patient continues to be appropriate for intensive inpatient rehabilitation.  I have reviewed the patient's chart and labs.  Questions were answered to the patient's satisfaction.  Shaheen Mende T 08/16/2012, 10:47 PM

## 2012-08-16 NOTE — Progress Notes (Signed)
Patient received from 4N09 at 1511. Expressive aphasia noted with slurred speech . Patient alert and oriented x 2-3. Dressing to  Chest intact. Oriented patient to room and call bell system . Right posterior forearm IV noted to be infiltrated IV fluids stopped. Patient verbalized understanding of admission process. Continue with plan of care.  Cleotilde Neer

## 2012-08-16 NOTE — H&P (View-Only) (Signed)
Physical Medicine and Rehabilitation Admission H&P    Chief Complaint  Patient presents with  . Cerebrovascular Accident  : HPI: [name] [name] is a 62 y.o. right-handed female with history of hypertension, tobacco abuse, coronary artery disease/STEMI with coronary artery bypass grafting and recent discharge after her CABG 07/31/2012. Admitted 08/13/2012 with right-sided weakness and aphasia. MRI of the brain showed acute nonhemorrhagic left PCA territory infarct MRA of the head was severe disease distal left PCA and basilar artery grossly patent. Echocardiogram with ejection fraction of 60% grade 2 diastolic dysfunction. Carotid Dopplers with less than 39% ICA stenosis. TEE 08/15/2012 showed ejection fraction 60% without thrombus. Followup cardiology services for monitoring of atrial fibrillation in relation to CVA with telemetry demonstrating sinus rhythm no atrial arrhythmias. Patient underwent implantable loop recorder per cardiology services 08/15/2012 for monitoring. Neurology services consulted maintained on aspirin therapy as prior to hospital admission as well as the addition of subcutaneous heparin for DVT prophylaxis. Patient did not receive TPA. Noted hypokalemia 2.8 with supplement added 08/16/2012. Physical therapy evaluation completed with recommendations of physical medicine rehabilitation consult to consider inpatient rehabilitation services. Patient was felt to be a good candidate for inpatient rehabilitation services and was admitted for comprehensive rehabilitation program  Review of Systems  Respiratory: Positive for cough.  Cardiovascular: Positive for chest pain.  Gastrointestinal: Positive for constipation.  Neurological: Positive for weakness.  All other systems reviewed and are negative   Past Medical History  Diagnosis Date  . Hypertension   . Hyperlipidemia LDL goal < 70 07/24/2012  . STEMI (ST elevation myocardial infarction), secondary to disease of RCA, with disease  of LAD and diag. branch and chronic non dominant LCX occlusion for CABG  07/21/2012  . 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 07/25/12 07/28/2012    POSTOPERATIVE DIAGNOSIS: Severe 3-vessel coronary disease, status post  myocardial infarction.  PROCEDURE: Median sternotomy, extracorporeal circulation, coronary  artery bypass grafting x5 (left internal mammary artery to LAD,  sequential saphenous vein graft to obtuse marginal 1, obtuse marginal 2,  and posterior descending with Y graft to 1st diagonal), endoscopic vein  harvest, left thigh.    . Tobacco abuse 07/21/2012   Past Surgical History  Procedure Laterality Date  . Coronary artery bypass graft N/A 07/25/2012    Procedure: CORONARY ARTERY BYPASS GRAFTING (CABG) times five with Left  Endoscopic Saphenous Vein Harvest and Left Internal  Mammary ;  Surgeon: Loreli Slot, MD;  Location: [facility] OR;  Service: Open Heart Surgery;  Laterality: N/A;   Family History  Problem Relation Age of Onset  . Hypertension Mother   . Stroke Brother     Died at age 71 secondary to MI  . CAD Brother    Social History:  reports that she has been smoking Cigarettes.  She has been smoking about 0.25 packs per day. She does not have any smokeless tobacco history on file. She reports that she does not drink alcohol or use illicit drugs. Allergies: No Known Allergies Medications Prior to Admission  Medication Sig Dispense Refill  . aspirin EC 325 MG tablet Take 325 mg by mouth daily.      Marland Kitchen atorvastatin (LIPITOR) 80 MG tablet Take 80 mg by mouth daily.      . cloNIDine (CATAPRES) 0.2 MG tablet Take 0.2 mg by mouth 2 (two) times daily.      Marland Kitchen lisinopril (PRINIVIL,ZESTRIL) 10 MG tablet Take 10 mg by mouth daily.      . metoprolol (LOPRESSOR) 50  MG tablet Take 50 mg by mouth 2 (two) times daily.      . traMADol (ULTRAM) 50 MG tablet Take 50-100 mg by mouth every 6 (six) hours as needed for pain.        Home: Home Living Family/patient expects  to be discharged to:: Private residence Living Arrangements: Parent;Other (Comment) (since CABG) Available Help at Discharge: Family;Available 24 hours/day;Other (Comment) Type of Home: House Home Access: Stairs to enter [employer] of Steps: 4-5 Entrance Stairs-Rails: None Home Layout: One level Home Equipment: Walker - 2 wheels;Cane - single point   Functional History: Prior Function Comments: pt was independent and lived alone prior to CABG; has been living with mother since; was amb without SPC or RW in the last few days prior to hospitalization  Functional Status:  Mobility: Bed Mobility Bed Mobility: Supine to Sit;Sit to Supine Supine to Sit: 4: Min assist;HOB elevated (only min assist because of HOB being elevated.) Sitting - Scoot to Edge of Bed: 3: Mod assist;With rail Sit to Supine: 3: Mod assist;HOB flat;With rail (impulsive) Transfers Transfers: Sit to Stand;Stand to Sit Sit to Stand: 4: Min assist Stand to Sit: 4: Min assist Ambulation/Gait Ambulation/Gait Assistance: 4: Min assist Ambulation Distance (Feet): 15 Feet (x2 to BR, then about 600 in halls pushing pole and controlli) Assistive device: Other (Comment);1 person hand held assist (iv pole) Ambulation/Gait Assistance Details: amb in small spaces such as going to the bathroom involved controlling her hemibody more due to incr. ataxia or R LE and inabililty to control R UE.  Pt had extreme difficulty with direction changes esp. baqcking up to toilet or sidestepping to center in front of the toilet, but in a straight line pushing the iv pole in her L UE and therapist helping control R UE and trunk pt did better displaying a stable Ext Rot'd HP gait with some ataxia and a tendency to wander L.  Not as much tendency to list L Gait Pattern: Step-through pattern;Ataxic;Narrow base of support (more stable HP gait) Gait velocity: decreased Stairs: No Wheelchair Mobility Wheelchair Mobility:  No  ADL: ADL Eating/Feeding: Maximal assistance Where Assessed - Eating/Feeding: Chair Grooming: Maximal assistance;Wash/dry hands;Wash/dry face;Teeth care Where Assessed - Grooming: Supported sitting Upper Body Bathing: Maximal assistance Where Assessed - Upper Body Bathing: Supported sitting Lower Body Bathing: +1 Total assistance Where Assessed - Lower Body Bathing: Supported sitting Upper Body Dressing: Maximal assistance Where Assessed - Upper Body Dressing: Supported sitting Lower Body Dressing: Maximal assistance Where Assessed - Lower Body Dressing: Supported sit to Pharmacist, hospital: Moderate assistance Toilet Transfer Method: Sit to stand Toilet Transfer Equipment: Comfort height toilet;Grab bars Tub/Shower Transfer: Maximal assistance Tub/Shower Transfer Method: Stand pivot Equipment Used: Gait belt;Rolling walker Transfers/Ambulation Related to ADLs: Pt needed max tactile and vc's for transfers and ambulation. Pt unable to keep RUE on RW, and performed better with hand held assist. Pt with heavy lean/push to the left when standing without RW. Required tactile cues to correct and stand up. ADL Comments: Session focused on vision. Pt with deficits in the right periphery, and potentially more. Pt was having problems following one step commands. Pt required visual and auditory stimulis to track pen, and then it was not sustained. Pt asked to draw clock. Pt unsuccessful with RUE movements uncontrolled, and Pt kepy loosing grip of the pen. Pt attempted with the left hand, and Pt able to draw circle and put 4 lines where 12, 3, 6, and 9 would be. Pt demonstrated good problem  solving. Pt able to scan length of 8x11.5 sheet of paper for items with head turn down and right to use left eye and central gaze.   Cognition: Cognition Overall Cognitive Status: Impaired/Different from baseline Arousal/Alertness: Awake/alert Orientation Level: Oriented to person;Disoriented to  place;Disoriented to situation;Oriented to time Attention: Focused;Sustained;Selective Focused Attention: Appears intact Sustained Attention: Appears intact Selective Attention: Appears intact Memory:  (UTA) Awareness: Impaired Awareness Impairment: Intellectual impairment;Emergent impairment Problem Solving: Impaired Problem Solving Impairment: Verbal basic;Functional basic Cognition Arousal/Alertness: Awake/alert Behavior During Therapy: Impulsive Overall Cognitive Status: Impaired/Different from baseline Area of Impairment: Attention;Memory;Following commands;Awareness Orientation Level: Disoriented to;Place;Situation Current Attention Level: Alternating Memory: Decreased short-term memory Following Commands: Follows one step commands inconsistently Safety/Judgement: Decreased awareness of safety;Decreased awareness of deficits Awareness: Intellectual;Anticipatory;Emergent Problem Solving: Slow processing;Decreased initiation;Difficulty sequencing;Requires verbal cues;Requires tactile cues General Comments: pt is a poor historian, is aphasic, perseverates on certain phrases and questions during session; sister present to give baseline cognition and PLOF   Physical Exam: Blood pressure 182/74, pulse 62, temperature 97.9 F (36.6 C), temperature source Oral, resp. rate 18, height 5\' 1"  (1.549 m), weight 53.6 kg (118 lb 2.7 oz), SpO2 100.00%. Physical Exam  Vitals reviewed.  HENT: dentition poor, oral mucosa pink and moist Head: Normocephalic.  Eyes: EOM are normal.  Neck: Normal range of motion. Neck supple. No thyromegaly present.  Cardiovascular:  Cardiac rate controlled, no murmurs auscultated Pulmonary/Chest: Breath sounds normal. No respiratory distress. No wheezes Abdominal: Soft. Bowel sounds are normal. She exhibits no distension. No pain Neurological: She is alert.  Patient was able to name person, place and date of birth. She was a bit impulsive. Limited attention  span.  Follows three-step commands. ?visual field loss to right (difficult to assess accurately due to her impulsive behavior and attention). Significant ataxia right arm and leg. Decreased PP and LT in right arm and leg as well. Reasonable insight and awareness when examiner was able to slow her down enough to have a conversation.  Skin:  Midline chest incision well-healed  Psychiatric: She has a normal mood and affect. Very pleasant and cooperative!   Results for orders placed during the hospital encounter of 08/13/12 (from the past 48 hour(s))  GLUCOSE, CAPILLARY     Status: None   Collection Time    08/13/12  3:25 PM      Result Value Range   Glucose-Capillary 97  70 - 99 mg/dL  PROTIME-INR     Status: None   Collection Time    08/13/12  4:20 PM      Result Value Range   Prothrombin Time 14.0  11.6 - 15.2 seconds   INR 1.10  0.00 - 1.49  APTT     Status: None   Collection Time    08/13/12  4:20 PM      Result Value Range   aPTT 26  24 - 37 seconds  CBC     Status: Abnormal   Collection Time    08/13/12  4:20 PM      Result Value Range   WBC 8.8  4.0 - 10.5 K/uL   RBC 3.66 (*) 3.87 - 5.11 MIL/uL   Hemoglobin 11.5 (*) 12.0 - 15.0 g/dL   HCT 16.1 (*) 09.6 - 04.5 %   MCV 93.2  78.0 - 100.0 fL   MCH 31.4  26.0 - 34.0 pg   MCHC 33.7  30.0 - 36.0 g/dL   RDW 40.9  81.1 - 91.4 %   Platelets 427 (*) 150 -  400 K/uL  DIFFERENTIAL     Status: Abnormal   Collection Time    08/13/12  4:20 PM      Result Value Range   Neutrophils Relative % 78 (*) 43 - 77 %   Neutro Abs 6.8  1.7 - 7.7 K/uL   Lymphocytes Relative 13  12 - 46 %   Lymphs Abs 1.1  0.7 - 4.0 K/uL   Monocytes Relative 8  3 - 12 %   Monocytes Absolute 0.7  0.1 - 1.0 K/uL   Eosinophils Relative 1  0 - 5 %   Eosinophils Absolute 0.0  0.0 - 0.7 K/uL   Basophils Relative 1  0 - 1 %   Basophils Absolute 0.1  0.0 - 0.1 K/uL  COMPREHENSIVE METABOLIC PANEL     Status: Abnormal   Collection Time    08/13/12  4:20 PM       Result Value Range   Sodium 136  135 - 145 mEq/L   Potassium 3.6  3.5 - 5.1 mEq/L   Chloride 96  96 - 112 mEq/L   CO2 25  19 - 32 mEq/L   Glucose, Bld 105 (*) 70 - 99 mg/dL   BUN 10  6 - 23 mg/dL   Creatinine, Ser 1.91  0.50 - 1.10 mg/dL   Calcium 9.9  8.4 - 47.8 mg/dL   Total Protein 7.5  6.0 - 8.3 g/dL   Albumin 3.3 (*) 3.5 - 5.2 g/dL   AST 19  0 - 37 U/L   ALT 23  0 - 35 U/L   Alkaline Phosphatase 77  39 - 117 U/L   Total Bilirubin 0.6  0.3 - 1.2 mg/dL   GFR calc non Af Amer >90  >90 mL/min   GFR calc Af Amer >90  >90 mL/min   Comment:            The eGFR has been calculated     using the CKD EPI equation.     This calculation has not been     validated in all clinical     situations.     eGFR's persistently     <90 mL/min signify     possible Chronic Kidney Disease.  TROPONIN I     Status: None   Collection Time    08/13/12  4:20 PM      Result Value Range   Troponin I <0.30  <0.30 ng/mL   Comment:            Due to the release kinetics of cTnI,     a negative result within the first hours     of the onset of symptoms does not rule out     myocardial infarction with certainty.     If myocardial infarction is still suspected,     repeat the test at appropriate intervals.  POCT I-STAT TROPONIN I     Status: None   Collection Time    08/13/12  4:31 PM      Result Value Range   Troponin i, poc 0.03  0.00 - 0.08 ng/mL   Comment 3            Comment: Due to the release kinetics of cTnI,     a negative result within the first hours     of the onset of symptoms does not rule out     myocardial infarction with certainty.     If myocardial infarction is still suspected,  repeat the test at appropriate intervals.  HEMOGLOBIN A1C     Status: None   Collection Time    08/14/12  6:35 AM      Result Value Range   Hemoglobin A1C 5.4  <5.7 %   Comment: (NOTE)                                                                               According to the ADA Clinical  Practice Recommendations for 2011, when     HbA1c is used as a screening test:      >=6.5%   Diagnostic of Diabetes Mellitus               (if abnormal result is confirmed)     5.7-6.4%   Increased risk of developing Diabetes Mellitus     References:Diagnosis and Classification of Diabetes Mellitus,Diabetes     Care,2011,34(Suppl 1):S62-S69 and Standards of Medical Care in             Diabetes - 2011,Diabetes Care,2011,34 (Suppl 1):S11-S61.   Mean Plasma Glucose 108  <117 mg/dL  LIPID PANEL     Status: Abnormal   Collection Time    08/14/12  6:35 AM      Result Value Range   Cholesterol 90  0 - 200 mg/dL   Triglycerides 72  <409 mg/dL   HDL 22 (*) >81 mg/dL   Total CHOL/HDL Ratio 4.1     VLDL 14  0 - 40 mg/dL   LDL Cholesterol 54  0 - 99 mg/dL   Comment:            Total Cholesterol/HDL:CHD Risk     Coronary Heart Disease Risk Table                         Men   Women      1/2 Average Risk   3.4   3.3      Average Risk       5.0   4.4      2 X Average Risk   9.6   7.1      3 X Average Risk  23.4   11.0                Use the calculated Patient Ratio     above and the CHD Risk Table     to determine the patient's CHD Risk.                ATP III CLASSIFICATION (LDL):      <100     mg/dL   Optimal      191-478  mg/dL   Near or Above                        Optimal      130-159  mg/dL   Borderline      295-621  mg/dL   High      >308     mg/dL   Very High  TROPONIN I     Status: None   Collection Time    08/14/12  9:17 PM  Result Value Range   Troponin I <0.30  <0.30 ng/mL   Comment:            Due to the release kinetics of cTnI,     a negative result within the first hours     of the onset of symptoms does not rule out     myocardial infarction with certainty.     If myocardial infarction is still suspected,     repeat the test at appropriate intervals.  AMMONIA     Status: None   Collection Time    08/15/12  6:00 AM      Result Value Range   Ammonia 26  11 -  60 umol/L  BASIC METABOLIC PANEL     Status: Abnormal   Collection Time    08/15/12  6:10 AM      Result Value Range   Sodium 138  135 - 145 mEq/L   Potassium 2.8 (*) 3.5 - 5.1 mEq/L   Chloride 103  96 - 112 mEq/L   CO2 22  19 - 32 mEq/L   Glucose, Bld 90  70 - 99 mg/dL   BUN 4 (*) 6 - 23 mg/dL   Creatinine, Ser 1.61  0.50 - 1.10 mg/dL   Calcium 8.8  8.4 - 09.6 mg/dL   GFR calc non Af Amer >90  >90 mL/min   GFR calc Af Amer >90  >90 mL/min   Comment:            The eGFR has been calculated     using the CKD EPI equation.     This calculation has not been     validated in all clinical     situations.     eGFR's persistently     <90 mL/min signify     possible Chronic Kidney Disease.  VITAMIN B12     Status: None   Collection Time    08/15/12  6:10 AM      Result Value Range   Vitamin B-12 459  211 - 911 pg/mL   Dg Chest 2 View  08/14/2012   *RADIOLOGY REPORT*  Clinical Data: Right arm weakness.  Stroke.  CHEST - 2 VIEW  Comparison: 07/28/2012 and 07/24/2012 radiographs.  Findings: The heart size is stable status post CABG.  The pleural effusions and bibasilar atelectasis have improved.  There is no edema or pneumothorax.  The osseous structures appear unchanged.  IMPRESSION: Improving basilar aeration and pleural effusion status post CABG.   Original Report Authenticated By: Carey Bullocks, M.D.   Ct Head (brain) Wo Contrast  08/13/2012   *RADIOLOGY REPORT*  Clinical Data: Altered mental status.  CT HEAD WITHOUT CONTRAST  Technique:  Contiguous axial images were obtained from the base of the skull through the vertex without contrast.  Comparison: None.  Findings: There is decreased attenuation in the left PCA territory with appearance most consistent with acute/subacute infarct. Remote left parietal infarct is also identified.  No hemorrhage, midline shift or hydrocephalus is seen. There is no pneumocephalus. Imaged paranasal sinuses and mastoid air cells are clear.  IMPRESSION:  1.   Findings compatible with acute/subacute infarct in the left PCA territory. 2.  Remote left parietal infarct. 3.  Critical Value/emergent results were called by telephone at the time of interpretation on 08/13/2012 at 1534 hours to Dr. Devoria Albe, who verbally acknowledged these results.   Original Report Authenticated By: Holley Dexter, M.D.   Mr Brain Wo Contrast  08/14/2012   *RADIOLOGY  REPORT*  Clinical Data:  Right-sided weakness.  Stroke risk factors include hypertension, hyperlipidemia, prior myocardial infarction, and tobacco abuse.  MRI HEAD WITHOUT CONTRAST MRA HEAD WITHOUT CONTRAST  Technique:  Multiplanar, multiecho pulse sequences of the brain and surrounding structures were obtained without intravenous contrast. Angiographic images of the head were obtained using MRA technique without contrast.  Comparison:  CT head 08/13/2012.  MRI HEAD  Findings:  Large acute left hemisphere infarct affects predominately  PCA territory including the posterior temporal lobe, much of the occipital lobe, portions of the parietal lobe, and much of the thalamus and posterior limb internal capsule, all on the left. There is no acute or subacute hemorrhage.  There is no midline shift.  There is a remote left parietal cortical and subcortical infarct with associated gliosis and encephalomalacia.  This is associated with slight chronic gyral microhemorrhage.  This infarct lies within the left MCA distribution.  There is mild atrophy.  There is mild chronic microvascular ischemic change.  Normal pituitary.    Negative osseous structures. Extracranial soft tissues unremarkable.  IMPRESSION: Acute non hemorrhagic left PCA territory infarct affecting most of that vessel's vascular territory. Chronic left parietal MCA territory infarct.  MRA HEAD  Findings: Significant motion degradation.  Gross patency of the carotid arteries is established.  The basilar artery is patent with left vertebral dominant. The left vertebral is  occluded except for its distal most segment.  This could fill via retrograde flow from the basilar.  There is no appreciable flow related enhancement of the right vertebral at the skull base.  There is no proximal flow limiting stenosis of the middle cerebral arteries.  Focal narrowing at the origin of the right anterior cerebral artery could be 75%. Severe disease bilateral PCA vessels, greater on the left. Moderate irregularity distal MCA vessels bilaterally.  IMPRESSION: Motion degraded scan.  Severe disease distal left PCA.  Basilar artery grossly patent. Abnormal distal right vertebral.   Original Report Authenticated By: Davonna Belling, M.D.   Mr Mra Head/brain Wo Cm  08/14/2012   *RADIOLOGY REPORT*  Clinical Data:  Right-sided weakness.  Stroke risk factors include hypertension, hyperlipidemia, prior myocardial infarction, and tobacco abuse.  MRI HEAD WITHOUT CONTRAST MRA HEAD WITHOUT CONTRAST  Technique:  Multiplanar, multiecho pulse sequences of the brain and surrounding structures were obtained without intravenous contrast. Angiographic images of the head were obtained using MRA technique without contrast.  Comparison:  CT head 08/13/2012.  MRI HEAD  Findings:  Large acute left hemisphere infarct affects predominately  PCA territory including the posterior temporal lobe, much of the occipital lobe, portions of the parietal lobe, and much of the thalamus and posterior limb internal capsule, all on the left. There is no acute or subacute hemorrhage.  There is no midline shift.  There is a remote left parietal cortical and subcortical infarct with associated gliosis and encephalomalacia.  This is associated with slight chronic gyral microhemorrhage.  This infarct lies within the left MCA distribution.  There is mild atrophy.  There is mild chronic microvascular ischemic change.  Normal pituitary.    Negative osseous structures. Extracranial soft tissues unremarkable.  IMPRESSION: Acute non hemorrhagic left  PCA territory infarct affecting most of that vessel's vascular territory. Chronic left parietal MCA territory infarct.  MRA HEAD  Findings: Significant motion degradation.  Gross patency of the carotid arteries is established.  The basilar artery is patent with left vertebral dominant. The left vertebral is occluded except for its distal most segment.  This  could fill via retrograde flow from the basilar.  There is no appreciable flow related enhancement of the right vertebral at the skull base.  There is no proximal flow limiting stenosis of the middle cerebral arteries.  Focal narrowing at the origin of the right anterior cerebral artery could be 75%. Severe disease bilateral PCA vessels, greater on the left. Moderate irregularity distal MCA vessels bilaterally.  IMPRESSION: Motion degraded scan.  Severe disease distal left PCA.  Basilar artery grossly patent. Abnormal distal right vertebral.   Original Report Authenticated By: Davonna Belling, M.D.    Post Admission Physician Evaluation: 1. Functional deficits secondary  to embolic left PCA infarct. 2. Patient is admitted to receive collaborative, interdisciplinary care between the physiatrist, rehab nursing staff, and therapy team. 3. Patient's level of medical complexity and substantial therapy needs in context of that medical necessity cannot be provided at a lesser intensity of care such as a SNF. 4. Patient has experienced substantial functional loss from his/her baseline which was documented above under the "Functional History" and "Functional Status" headings.  Judging by the patient's diagnosis, physical exam, and functional history, the patient has potential for functional progress which will result in measurable gains while on inpatient rehab.  These gains will be of substantial and practical use upon discharge  in facilitating mobility and self-care at the household level. 5. Physiatrist will provide 24 hour management of medical needs as well as  oversight of the therapy plan/treatment and provide guidance as appropriate regarding the interaction of the two. 6. 24 hour rehab nursing will assist with bladder management, bowel management, safety, skin/wound care, disease management, medication administration, pain management and patient education  and help integrate therapy concepts, techniques,education, etc. 7. PT will assess and treat for/with: Lower extremity strength, range of motion, stamina, balance, functional mobility, safety, adaptive techniques and equipment, NMR, education.   Goals are: supervision to mod I. 8. OT will assess and treat for/with: ADL's, functional mobility, safety, upper extremity strength, adaptive techniques and equipment, NMR, education.   Goals are: supervision to mod I. 9. SLP will assess and treat for/with: speech,communication, cognition.  Goals are: mod I to supervision. 10. Case Management and Social Worker will assess and treat for psychological issues and discharge planning. 11. Team conference will be held weekly to assess progress toward goals and to determine barriers to discharge. 12. Patient will receive at least 3 hours of therapy per day at least 5 days per week. 13. ELOS: 10 days to 2 weeks       14. Prognosis:  excellent   Medical Problem List and Plan: 1. 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 2. DVT Prophylaxis/Anticoagulation: Subcutaneous heparin. Monitor platelet counts any signs of bleeding 3. Pain Management: Tylenol as needed. 4. Neuropsych: This patient is capable of making decisions on her own behalf. 5. 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 Followup cardiology services as needed. No chest pain or shortness of breath. 6. Hypertension. Clonidine 0.2 mg twice a day, lisinopril 10 mg daily, Lopressor 50 mg twice a day. Monitor with increased activity 7. Hyperlipidemia. Lipitor 8. Tobacco abuse. Counseling 9. Hypokalemia. Followup chemistries on admit.  Ranelle Oyster, MD, [facility] Physical Medicine & Rehabilitation  08/15/2012

## 2012-08-16 NOTE — Progress Notes (Signed)
I plan to admit pt to inpt rehab today and pt is in agreement. I will discuss with Dr. Jomarie Longs. [phone]

## 2012-08-16 NOTE — Plan of Care (Signed)
Overall Plan of Care [facility]) Patient Details Name: [name] [name] MRN: [id number] DOB: 1950-05-18  Diagnosis:  Left PCA infarct  Co-morbidities: CAD, htn, hypokalemia  Functional Problem List  Patient demonstrates impairments in the following areas: Balance, Cognition, Endurance, Medication Management, Motor, Nutrition, Safety, Sensory  and Vision  Basic ADL's: eating, grooming, bathing, dressing and toileting Advanced ADL's: simple meal preparation  Transfers:  bed mobility, bed to chair, car and floor Locomotion:  ambulation and stairs  Additional Impairments:  Communication  comprehension and expression and Social Cognition   problem solving, attention and awareness  Anticipated Outcomes Item Anticipated Outcome  Eating/Swallowing    Basic self-care  Supervision to modified independent  Tolieting  Modified independent  Bowel/Bladder  Mod I  Transfers  Supervision  Locomotion  Supervision  Communication  Supervision  Cognition  Supervision  Pain  Min assist   Safety/Judgment  Mod I  Other     Therapy Plan: PT Intensity: Minimum of 1-2 x/day ,45 to 90 minutes PT Frequency: 5 out of 7 days PT Duration Estimated Length of Stay: 10 days OT Intensity: Minimum of 1-2 x/day, 45 to 90 minutes OT Frequency: 5 out of 7 days OT Duration/Estimated Length of Stay: 10-12 days SLP Intensity: Minumum of 1-2 x/day, 30 to 90 minutes SLP Frequency: 5 out of 7 days SLP Duration/Estimated Length of Stay: 10 days    Team Interventions: Item RN PT OT SLP SW TR Other  Self Care/Advanced ADL Retraining  x x      Neuromuscular Re-Education  x x      Therapeutic Activities  x x x     UE/LE Strength Training/ROM  x x      UE/LE Coordination Activities  x x      Visual/Perceptual Remediation/Compensation  x x x     DME/Adaptive Equipment Instruction  x x      Therapeutic Exercise  x x      Balance/Vestibular Training x x x      Patient/Family Education x x x x     Cognitive  Remediation/Compensation x  x x     Functional Mobility Training  x x      Ambulation/Gait Training x x       Stair Training  x       Wheelchair Propulsion/Positioning  x       Functional Statistician  x       Community Reintegration  x x      Dysphagia/Aspiration Printmaker x        Speech/Language Facilitation x   x     Bladder Management         Bowel Management         Disease Management/Prevention x        Pain Management x x x      Medication Management x        Skin Care/Wound Management x        Splinting/Orthotics    x     Discharge Planning  x  x     Psychosocial Support x                               Team Discharge Planning: Destination: PT-Home ,OT- Home , SLP-Home Projected Follow-up: PT-Home health PT, OT-  Outpatient OT, SLP-Outpatient SLP;24 hour supervision/assistance Projected Equipment Needs: PT- , OT- Tub/shower seat, SLP-None recommended by SLP Patient/family involved in discharge planning: PT-  Patient,  OT-Patient, SLP-Patient  MD ELOS: 10-12 days Medical Rehab Prognosis:  Excellent Assessment: The patient has been admitted for CIR therapies. The team will be addressing, functional mobility, strength, stamina, balance, safety, adaptive techniques/equipment, self-care, bowel and bladder mgt, patient and caregiver education, NMR, cognitive perceptual and visual perceptual rx, communication, stroke ed. Goals have been set at supervision to modified independent.    Ranelle Oyster, MD, FAAPMR      See Team Conference Notes for weekly updates to the plan of care

## 2012-08-17 ENCOUNTER — Inpatient Hospital Stay (HOSPITAL_COMMUNITY): Payer: Medicaid Other | Admitting: Occupational Therapy

## 2012-08-17 ENCOUNTER — Inpatient Hospital Stay (HOSPITAL_COMMUNITY): Payer: Medicaid Other | Admitting: Speech Pathology

## 2012-08-17 ENCOUNTER — Inpatient Hospital Stay (HOSPITAL_COMMUNITY): Payer: 59 | Admitting: Rehabilitation

## 2012-08-17 ENCOUNTER — Encounter (HOSPITAL_COMMUNITY): Payer: Self-pay | Admitting: *Deleted

## 2012-08-17 ENCOUNTER — Other Ambulatory Visit: Payer: Self-pay | Admitting: *Deleted

## 2012-08-17 DIAGNOSIS — I2510 Atherosclerotic heart disease of native coronary artery without angina pectoris: Secondary | ICD-10-CM

## 2012-08-17 DIAGNOSIS — IMO0001 Reserved for inherently not codable concepts without codable children: Secondary | ICD-10-CM

## 2012-08-17 DIAGNOSIS — E1165 Type 2 diabetes mellitus with hyperglycemia: Secondary | ICD-10-CM

## 2012-08-17 DIAGNOSIS — I6340 Cerebral infarction due to embolism of unspecified cerebral artery: Secondary | ICD-10-CM

## 2012-08-17 DIAGNOSIS — I10 Essential (primary) hypertension: Secondary | ICD-10-CM

## 2012-08-17 LAB — CBC WITH DIFFERENTIAL/PLATELET
Lymphocytes Relative: 21 % (ref 12–46)
Lymphs Abs: 1.4 10*3/uL (ref 0.7–4.0)
MCV: 92.1 fL (ref 78.0–100.0)
Neutro Abs: 4.3 10*3/uL (ref 1.7–7.7)
Neutrophils Relative %: 64 % (ref 43–77)
Platelets: 334 10*3/uL (ref 150–400)
RBC: 3.42 MIL/uL — ABNORMAL LOW (ref 3.87–5.11)
WBC: 6.6 10*3/uL (ref 4.0–10.5)

## 2012-08-17 LAB — COMPREHENSIVE METABOLIC PANEL
ALT: 19 U/L (ref 0–35)
Alkaline Phosphatase: 65 U/L (ref 39–117)
CO2: 24 mEq/L (ref 19–32)
Chloride: 107 mEq/L (ref 96–112)
GFR calc Af Amer: >90 mL/min (ref >90)
GFR calc non Af Amer: >90 mL/min (ref >90)
Glucose, Bld: 106 mg/dL — ABNORMAL HIGH (ref 70–99)
Potassium: 2.9 mEq/L — ABNORMAL LOW (ref 3.5–5.1)
Sodium: 142 mEq/L (ref 135–145)

## 2012-08-17 MED ORDER — POTASSIUM CHLORIDE CRYS ER 20 MEQ PO TBCR
40.0000 meq | EXTENDED_RELEASE_TABLET | Freq: Two times a day (BID) | ORAL | Status: AC
  Administered 2012-08-17 (×2): 40 meq via ORAL
  Filled 2012-08-17 (×2): qty 2

## 2012-08-17 NOTE — Progress Notes (Signed)
Subjective/Complaints: Slept well. Excited to start therapies. Denies pain.  A 12 point review of systems has been performed and if not noted above is otherwise negative.   Objective: Vital Signs: Blood pressure 162/90, pulse 64, temperature 98.1 F (36.7 C), temperature source Oral, resp. rate 20, height 5\' 4"  (1.626 m), weight 64.1 kg (141 lb 5 oz), SpO2 100.00%. No results found.  Recent Labs  08/16/12 1610 08/17/12 0620  WBC 6.8 6.6  HGB 10.3* 10.5*  HCT 30.5* 31.5*  PLT 341 334    Recent Labs  08/16/12 0455 08/16/12 1610 08/17/12 0620  NA 141  --  142  K 2.8*  --  2.9*  CL 106  --  107  GLUCOSE 118*  --  106*  BUN 5*  --  3*  CREATININE 0.58 0.52 0.59  CALCIUM 8.8  --  9.3   CBG (last 3)  No results found for this basename: GLUCAP,  in the last 72 hours  Wt Readings from Last 3 Encounters:  08/16/12 64.1 kg (141 lb 5 oz)  08/13/12 53.6 kg (118 lb 2.7 oz)  08/13/12 53.6 kg (118 lb 2.7 oz)    Physical Exam:  HENT: dentition poor, oral mucosa pink and moist  Head: Normocephalic.  Eyes: EOM are normal.  Neck: Normal range of motion. Neck supple. No thyromegaly present.  Cardiovascular:  Cardiac rate controlled, no murmurs auscultated  Pulmonary/Chest: Breath sounds normal. No respiratory distress. No wheezes  Abdominal: Soft. Bowel sounds are normal. She exhibits no distension. No pain Neurological: She is alert.  Patient was able to name person, place and date of birth. She was a bit impulsive. Limited attention span. Follows three-step commands. ?visual field loss to right. Significant ataxia right arm and leg. Decreased PP and LT in right arm and leg as well. Reasonable insight and awareness when examiner was able to slow her down enough to have a conversation.  Skin:  Midline chest incision well-healed  Psychiatric: She has a normal mood and affect.   Assessment/Plan: 1. Functional deficits secondary to left PCA infarct which require 3+ hours per day  of interdisciplinary therapy in a comprehensive inpatient rehab setting. Physiatrist is providing close team supervision and 24 hour management of active medical problems listed below. Physiatrist and rehab team continue to assess barriers to discharge/monitor patient progress toward functional and medical goals. FIM:                                  Medical Problem List and Plan:  1. 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  2. DVT Prophylaxis/Anticoagulation: Subcutaneous heparin. Follow platelets and for any signs of bleeding 3. Pain Management: Tylenol as needed.  4. Neuropsych: This patient is capable of making decisions on her own behalf.  5. 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 Followup cardiology services as needed. No chest pain or shortness of breath.  6. Hypertension. Clonidine 0.2 mg twice a day, lisinopril 10 mg daily, Lopressor 50 mg twice a day. Monitor with increased activity  7. Hyperlipidemia. Lipitor  8. Tobacco abuse. Counseling  9. Hypokalemia. Continue to supplement. Recheck tomorrow  LOS (Days) 1 A FACE TO FACE EVALUATION WAS PERFORMED  SWARTZ,ZACHARY T 08/17/2012 8:57 AM

## 2012-08-17 NOTE — Progress Notes (Signed)
Patient information reviewed and entered into eRehab system by Koji Niehoff, RN, CRRN, PPS Coordinator.  Information including medical coding and functional independence measure will be reviewed and updated through discharge.    

## 2012-08-17 NOTE — Evaluation (Signed)
Occupational Therapy Assessment and Plan  [name]: [id number] Date of Birth: 1951/01/26  OT Diagnosis: cognitive deficits, disturbance of vision, hemiplegia affecting dominant side and muscle weakness (generalized) Rehab Potential: Rehab Potential: Excellent ELOS: 10-12 days   Today's Date: 08/17/2012 Time: 0901-10:02 Time Calculation (min): 61 min  Problem List:  Patient Active Problem List   Diagnosis Date Noted  . Embolic cerebral infarction 08/16/2012  . Acute CVA (cerebrovascular accident) 08/13/2012  . 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 07/25/12 07/28/2012  . Hyperlipidemia LDL goal < 70 07/24/2012  . STEMI (ST elevation myocardial infarction), secondary to disease of RCA, with disease of LAD and diag. branch and chronic non dominant LCX occlusion for CABG  07/21/2012  . Essential hypertension 07/21/2012  . Tobacco abuse 07/21/2012    Past Medical History:  Past Medical History  Diagnosis Date  . Hypertension   . Hyperlipidemia LDL goal < 70 07/24/2012  . STEMI (ST elevation myocardial infarction), secondary to disease of RCA, with disease of LAD and diag. branch and chronic non dominant LCX occlusion for CABG  07/21/2012  . 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 07/25/12 07/28/2012    POSTOPERATIVE DIAGNOSIS: Severe 3-vessel coronary disease, status post  myocardial infarction.  PROCEDURE: Median sternotomy, extracorporeal circulation, coronary  artery bypass grafting x5 (left internal mammary artery to LAD,  sequential saphenous vein graft to obtuse marginal 1, obtuse marginal 2,  and posterior descending with Y graft to 1st diagonal), endoscopic vein  harvest, left thigh.    . Tobacco abuse 07/21/2012  . Stroke    Past Surgical History:  Past Surgical History  Procedure Laterality Date  . Coronary artery bypass graft N/A 07/25/2012    Procedure: CORONARY ARTERY BYPASS GRAFTING (CABG) times five with Left   Endoscopic Saphenous Vein Harvest and Left Internal  Mammary ;  Surgeon: Loreli Slot, MD;  Location: [facility] OR;  Service: Open Heart Surgery;  Laterality: N/A;  . Tee without cardioversion N/A 08/15/2012    Procedure: TRANSESOPHAGEAL ECHOCARDIOGRAM (TEE);  Surgeon: Wendall Stade, MD;  Location: [facility] ENDOSCOPY;  Service: Cardiovascular;  Laterality: N/A;  . Loop recorder implant  08-15-2012    Medtronic LinQ implanted by Dr Johney Frame for cryptogenic stroke    Assessment & Plan Clinical Impression: Patient is a 62 y.o. year old female with recent admission to the hospital 08/13/2012 with right-sided weakness and aphasia. MRI of the brain showed acute nonhemorrhagic left PCA territory infarct MRA of the head was severe disease distal left PCA and basilar artery grossly patent.  Patient transferred to CIR on 08/16/2012 .    Patient currently requires min with basic self-care skills secondary to muscle weakness, motor apraxia, decreased coordination and decreased motor planning and field cut and hemianopsia.  Prior to hospitalization, patient could complete ADLS and IADLs with independent .  Patient will benefit from skilled intervention to decrease level of assist with basic self-care skills, increase independence with basic self-care skills and increase level of independence with iADL prior to discharge home with her mother.  Anticipate patient will require 24 hour supervision and follow up outpatient.  OT - End of Session Activity Tolerance: Tolerates 30+ min activity without fatigue Endurance Deficit: No OT Assessment Rehab Potential: Excellent OT Plan OT Intensity: Minimum of 1-2 x/day, 45 to 90 minutes OT Frequency: 5 out of 7 days OT Duration/Estimated Length of Stay: 10-12 days OT Treatment/Interventions: Balance/vestibular training;Cognitive remediation/compensation;Community reintegration;Discharge planning;Functional mobility training;DME/adaptive equipment instruction;Neuromuscular  re-education;Pain management;Patient/family education;Splinting/orthotics;Therapeutic Activities;UE/LE Coordination activities;UE/LE Strength taining/ROM;Therapeutic Exercise;Self Care/advanced ADL retraining OT Recommendation Patient destination: Home Follow Up Recommendations: Outpatient OT Equipment Recommended: Tub/shower seat   Skilled Therapeutic Intervention During session began education on RUE integration into selfcare tasks, visual compensation, safety, balance.  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  OT Evaluation Precautions/Restrictions  Precautions Precautions: Sternal;Fall Precaution Comments: Pt unable to recall sternal precautions, re-educated, will provide handout. CABG 2 weeks prior Restrictions Weight Bearing Restrictions: No  Pain Pain Assessment Pain Assessment: No/denies pain Pain Score: 0-No pain Home Living/Prior Functioning Home Living Available Help at Discharge: Available 24 hours/day Type of Home: House Home Access: Stairs to enter [employer] of Steps: 4-5 Entrance Stairs-Rails: None Home Layout: One level Additional Comments: She has a cane and RW  Lives With: Daughter (mom) Prior Function Level of Independence: Independent with basic ADLs;Independent with transfers;Independent with gait  Able to Take Stairs?: Yes Driving: Yes ADL ADL Eating: Supervision/safety Where Assessed-Eating: Chair Grooming: Supervision/safety Where Assessed-Grooming: Chair Upper Body Bathing: Supervision/safety Where Assessed-Upper Body Bathing: Sitting at sink Lower Body Bathing: Minimal assistance Where Assessed-Lower Body Bathing: Standing at sink;Sitting at sink Upper Body Dressing: Not assessed Lower Body Dressing: Minimal assistance Where Assessed-Lower Body Dressing: Standing at sink;Sitting at sink Toileting: Minimal assistance Where Assessed-Toileting: Teacher, adult education: Archivist Method: Proofreader: Raised toilet seat Tub/Shower Transfer: Not assessed Film/video editor: Not assessed ADL Comments: Pt with increased motor apraxia and decreased RUE coordination.  Pt attempts occasional use of the UE with bathing and grooming tasks but relies more on the LUE at this time.   Vision/Perception  Vision - History Baseline Vision: No visual deficits Patient Visual Report: Peripheral vision impairment Vision - Assessment Eye Alignment: Within Functional Limits Vision Assessment: Vision tested Tracking/Visual Pursuits: Able to track stimulus in all quads without difficulty Visual Fields: Right homonymous hemianopsia Perception Perception: Within Functional Limits Praxis Praxis: Impaired Praxis Impairment Details: Motor planning  Cognition Overall Cognitive Status: Impaired/Different from baseline Arousal/Alertness: Awake/alert Orientation Level: Oriented to person;Oriented to place;Disoriented to time;Disoriented to situation Attention: Sustained Focused Attention: Appears intact Sustained Attention: Appears intact Selective Attention: Impaired Selective Attention Impairment: Functional basic Awareness: Impaired Awareness Impairment: Intellectual impairment Problem Solving: Impaired Problem Solving Impairment: Functional basic;Verbal basic Behaviors: Impulsive Safety/Judgment: Impaired Comments: During w/c mobility, pt would continuously lean too far foward and require some assist to prevent fall forward.  Sensation Sensation Light Touch: Impaired Detail Light Touch Impaired Details: Impaired RUE Stereognosis: Impaired Detail Stereognosis Impaired Details: Impaired RUE Hot/Cold: Impaired Detail Hot/Cold Impaired Details: Impaired RUE Proprioception: Impaired Detail Proprioception Impaired Details: Impaired RUE Additional Comments: Pt with absent light touch, pressure, and proprioception in the  RUE. Coordination Gross Motor Movements are Fluid and Coordinated: No Fine Motor Movements are Fluid and Coordinated: No Coordination and Movement Description: Pt with dysmetria with finger to nose testing with the RUE.  Also demonstrates decreased motor planning and coordination to perform FM tasks or to oppose the thumb to the pad of any other finger. Heel Shin Test: pt uncoordinated when performing R heel to shin test Motor  Motor Motor: Motor apraxia Motor - Skilled Clinical Observations: Pt with decreased coordination in the RUE with reaching and grooming tasks.  Hand is more impaired than the shoulder and elbow. Mobility  Bed Mobility Bed Mobility: Supine to Sit Supine to Sit: 5: Supervision Supine to Sit Details: Verbal cues for sequencing Sitting - Scoot to  Edge of Bed: 5: Supervision Sitting - Scoot to Edge of Bed Details: Verbal cues for technique Sit to Supine: 5: Supervision Sit to Supine - Details: Verbal cues for precautions/safety Transfers Sit to Stand: 4: Min assist;From bed;From chair/3-in-1;Without upper extremity assist Sit to Stand Details: Verbal cues for precautions/safety Stand to Sit: 4: Min assist;Without upper extremity assist;To chair/3-in-1 Stand to Sit Details (indicate cue type and reason): Verbal cues for technique  Trunk/Postural Assessment  Cervical Assessment Cervical Assessment: Within Functional Limits Thoracic Assessment Thoracic Assessment: Within Functional Limits Lumbar Assessment Lumbar Assessment: Within Functional Limits Postural Control Postural Control: Within Functional Limits  Balance Balance Balance Assessed: Yes Standardized Balance Assessment Standardized Balance Assessment: Berg Balance Test Berg Balance Test Sit to Stand: Needs minimal aid to stand or to stabilize Standing Unsupported: Able to stand 2 minutes with supervision Sitting with Back Unsupported but Feet Supported on Floor or Stool: Able to sit safely and securely  2 minutes Stand to Sit: Sits independently, has uncontrolled descent Transfers: Needs one person to assist Standing Unsupported with Eyes Closed: Able to stand 10 seconds with supervision Standing Ubsupported with Feet Together: Needs help to attain position and unable to hold for 15 seconds From Standing, Reach Forward with Outstretched Arm: Loses balance while trying/requires external support From Standing Position, Pick up Object from Floor: Able to pick up shoe, needs supervision From Standing Position, Turn to Look Behind Over each Shoulder: Looks behind one side only/other side shows less weight shift Turn 360 Degrees: Needs assistance while turning Standing Unsupported, Alternately Place Feet on Step/Stool: Needs assistance to keep from falling or unable to try Standing Unsupported, One Foot in Front: Loses balance while stepping or standing Standing on One Leg: Unable to try or needs assist to prevent fall Total Score: 19 Static Sitting Balance Static Sitting - Level of Assistance: 5: Stand by assistance Static Standing Balance Static Standing - Balance Support: Right upper extremity supported;Left upper extremity supported Static Standing - Level of Assistance: 4: Min assist Extremity/Trunk Assessment RUE Assessment RUE Assessment: Exceptions to [facility] RUE Strength RUE Overall Strength Comments: Pt with full AROM in shoulder and elbow.  Strength in these areas at least 3+/5 but not formally assessed secondary to sternal precautions.  Full grasp and release also present with grip strength 3+/5.  Pt with decreased coordination with opposition of thumb to digits.  Unable to open small containers and is only able to use as a gross assist for tasks without assistance.  Pt with frequent droping of washcloth during bathing tasks. LUE Assessment LUE Assessment: Within Functional Limits  FIM:  FIM - Grooming Grooming Steps: Wash, rinse, dry face;Wash, rinse, dry hands;Oral care, brush teeth,  clean dentures Grooming: 5: Supervision: safety issues or verbal cues FIM - Bathing Bathing Steps Patient Completed: Chest;Right Arm;Left Arm;Abdomen;Front perineal area;Buttocks;Right upper leg;Left upper leg;Right lower leg (including foot);Left lower leg (including foot) Bathing: 4: Steadying assist FIM - Upper Body Dressing/Undressing Upper body dressing/undressing: 0: Activity did not occur FIM - Lower Body Dressing/Undressing Lower body dressing/undressing: 4: Min-Patient completed 75 plus % of tasks FIM - Toileting Toileting steps completed by patient: Adjust clothing after toileting;Performs perineal hygiene;Adjust clothing prior to toileting Toileting: 4: Steadying assist FIM - Bed/Chair Transfer Bed/Chair Transfer: 5: Supine > Sit: Supervision (verbal cues/safety issues);5: Sit > Supine: Supervision (verbal cues/safety issues);4: Bed > Chair or W/C: Min A (steadying Pt. > 75%);4: Chair or W/C > Bed: Min A (steadying Pt. > 75%) FIM - Toilet Transfers Toilet Transfers: 4-To  toilet/BSC: Min A (steadying Pt. > 75%);4-From toilet/BSC: Min A (steadying Pt. > 75%) FIM - Tub/Shower Transfers Tub/shower Transfers: 0-Activity did not occur or was simulated   Refer to Care Plan for Long Term Goals  Recommendations for other services: None  Discharge Criteria: Patient will be discharged from OT if patient refuses treatment 3 consecutive times without medical reason, if treatment goals not met, if there is a change in medical status, if patient makes no progress towards goals or if patient is discharged from hospital.  The above assessment, treatment plan, treatment alternatives and goals were discussed and mutually agreed upon: by patient  Pamelia Botto OTR/L 08/17/2012, 12:59 PM

## 2012-08-17 NOTE — Evaluation (Signed)
Speech Language Pathology Assessment and Plan  [name]: [id number] Date of Birth: 01/12/1951  SLP Diagnosis: Aphasia;Cognitive Impairments  Rehab Potential: Good ELOS: 10 days   Today's Date: 08/17/2012 Time: 1308-6578 Time Calculation (min): 60 min  Skilled Therapeutic Intervention: Administered cognitive-linguistic evaluation. Please see below for details. Pt educated on current cognitive and language impairments and goals of SLP intervention. Pt verbalized understanding.   Problem List:  Patient Active Problem List   Diagnosis Date Noted  . Embolic cerebral infarction 08/16/2012  . Acute CVA (cerebrovascular accident) 08/13/2012  . 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 07/25/12 07/28/2012  . Hyperlipidemia LDL goal < 70 07/24/2012  . STEMI (ST elevation myocardial infarction), secondary to disease of RCA, with disease of LAD and diag. branch and chronic non dominant LCX occlusion for CABG  07/21/2012  . Essential hypertension 07/21/2012  . Tobacco abuse 07/21/2012   Past Medical History:  Past Medical History  Diagnosis Date  . Hypertension   . Hyperlipidemia LDL goal < 70 07/24/2012  . STEMI (ST elevation myocardial infarction), secondary to disease of RCA, with disease of LAD and diag. branch and chronic non dominant LCX occlusion for CABG  07/21/2012  . 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 07/25/12 07/28/2012    POSTOPERATIVE DIAGNOSIS: Severe 3-vessel coronary disease, status post  myocardial infarction.  PROCEDURE: Median sternotomy, extracorporeal circulation, coronary  artery bypass grafting x5 (left internal mammary artery to LAD,  sequential saphenous vein graft to obtuse marginal 1, obtuse marginal 2,  and posterior descending with Y graft to 1st diagonal), endoscopic vein  harvest, left thigh.    . Tobacco abuse 07/21/2012  . Stroke    Past Surgical History:  Past Surgical History  Procedure Laterality  Date  . Coronary artery bypass graft N/A 07/25/2012    Procedure: CORONARY ARTERY BYPASS GRAFTING (CABG) times five with Left  Endoscopic Saphenous Vein Harvest and Left Internal  Mammary ;  Surgeon: Loreli Slot, MD;  Location: [facility] OR;  Service: Open Heart Surgery;  Laterality: N/A;  . Tee without cardioversion N/A 08/15/2012    Procedure: TRANSESOPHAGEAL ECHOCARDIOGRAM (TEE);  Surgeon: Wendall Stade, MD;  Location: [facility] ENDOSCOPY;  Service: Cardiovascular;  Laterality: N/A;  . Loop recorder implant  08-15-2012    Medtronic LinQ implanted by Dr Johney Frame for cryptogenic stroke    Assessment / Plan / Recommendation Clinical Impression  Patient is a 62 y.o. year old right-handed female with history of hypertension, tobacco abuse, coronary artery disease/STEMI with coronary artery bypass grafting and recent discharge after her CABG 07/31/2012. Admitted 08/13/2012 with right-sided weakness and aphasia. MRI of the brain showed acute nonhemorrhagic left PCA territory infarct MRA of the head was severe disease distal left PCA and basilar artery grossly patent. Patient transferred to CIR on 08/16/2012 and presents with mild receptive and moderate expressive deficits. Pt demonstrates word-finding deficits leading to dysfluency and empty speech. Pt also demonstrates perseveration and semantic paraphasias impacting pt's overall functional communication. Pt also demonstrates a right inattention, decreased problem solving, intellectual awareness and safety awareness. Patient will benefit from skilled SLP intervention to maximize verbal expression and cognitive function to maximize functional independence prior to discharge home. Anticipate patient will require 24 hour supervision and follow up services.     SLP Assessment  Patient will need skilled Speech Lanaguage Pathology Services during CIR admission    Recommendations  Patient destination: Home Follow up Recommendations: Outpatient SLP;24 hour  supervision/assistance Equipment Recommended:  None recommended by SLP    SLP Frequency 5 out of 7 days   SLP Treatment/Interventions Cognitive remediation/compensation;Cueing hierarchy;Environmental controls;Functional tasks;Internal/external aids;Therapeutic Activities;Patient/family education    Pain No/Denies Pain  Short Term Goals: Week 1: SLP Short Term Goal 1 (Week 1): Pt will name familiar items during functional tasks with min phonemic and semantic cues with 90% accuracy.  SLP Short Term Goal 2 (Week 1): Pt will attend to right side of environment/body during functional tasks with Min A verbal and question cues.   SLP Short Term Goal 3 (Week 1): Pt will follow 2 step commands with Min A verbal and question cues.  SLP Short Term Goal 4 (Week 1): Pt will demonstrate functional problem solving for basic and familair tasks with Min A verbal cues.  SLP Short Term Goal 5 (Week 1): Pt will demonstrate reading comprehension at the word level with 50% accuracy and Mod verbal and semantic cues.   See FIM for current functional status Refer to Care Plan for Long Term Goals  Recommendations for other services: None  Discharge Criteria: Patient will be discharged from SLP if patient refuses treatment 3 consecutive times without medical reason, if treatment goals not met, if there is a change in medical status, if patient makes no progress towards goals or if patient is discharged from hospital.  The above assessment, treatment plan, treatment alternatives and goals were discussed and mutually agreed upon: by patient  [name] [name] 08/17/2012, 3:10 PM

## 2012-08-17 NOTE — Care Management Note (Signed)
[facility] Individual Statement of Services  Patient Name:  [name] [name]  Date:  08/17/2012  Welcome to the [facility].  Our goal is to provide you with an individualized program based on your diagnosis and situation, designed to meet your specific needs.  With this comprehensive rehabilitation program, you will be expected to participate in at least 3 hours of rehabilitation therapies Monday-Friday, with modified therapy programming on the weekends.  Your rehabilitation program will include the following services:  Physical Therapy (PT), Occupational Therapy (OT), Speech Therapy (ST), 24 hour per day rehabilitation nursing, Case Management ( Social Worker), Rehabilitation Medicine, Nutrition Services and Pharmacy Services  Weekly team conferences will be held on Wednesday to discuss your progress.  Your Social Worker will talk with you frequently to get your input and to update you on team discussions.  Team conferences with you and your family in attendance may also be held.  Expected length of stay: 10-12 days  Overall anticipated outcome: Supervision with cues  Depending on your progress and recovery, your program may change. Your Social Worker will coordinate services and will keep you informed of any changes. Your Child psychotherapist names and contact numbers are listed  below.  The following services may also be recommended but are not provided by the [facility]:   Driving Evaluations  Home Health Rehabiltiation Services  Outpatient Rehabilitatation Servives    Arrangements will be made to provide these services after discharge if needed.  Arrangements include referral to agencies that provide these services.  Your insurance has been verified to be:  Pending Medicaid Your primary doctor is:  [facility] in [place]  Pertinent information will be shared with your doctor and your insurance company.  Social Worker:   Dossie Der, [place] [phone]  Information discussed with and copy given to patient by: Lucy Chris, 08/17/2012, 2:08 PM

## 2012-08-17 NOTE — Progress Notes (Signed)
Occupational Therapy Session Note  [name]: [id number] Date of Birth: 1950-05-31  Today's Date: 08/17/2012 Time: 1135-1204 Time Calculation (min): 29 min  Skilled Therapeutic Interventions/Progress Updates:   Pt ambulated to the gym with min assist for balance.  While ambulating had pt work on locating objects in the hall.  Pt unable to maintain divided attention between visual scanning task and mobility.  In the gym incorporated the RUE while standing to place magnetic checkers on appropriate blocks while scanning to locate them on the left side of the grid.  Pt unable to grasp checkers but she was able to slide them around the board.  Min questioning cues to locate items on the left side of the board.  Ambulated back to room at end of session and discussed progress, goals with family and pt.  Therapy Documentation Precautions:  Precautions Precautions: Sternal;Fall Precaution Comments: Pt unable to recall sternal precautions, re-educated, will provide handout. CABG 2 weeks prior Restrictions Weight Bearing Restrictions: No  Pain: Pain Assessment Pain Assessment: 0-10 Pain Score: 5  Pain Location: Hand Pain Orientation: Right Pain Radiating Towards: palm to fingertips Pain Descriptors / Indicators: Stabbing;Sharp Pain Frequency: Rarely Pain Onset: Other (Comment) (with touch)   Therapy/Group: Individual Therapy  Sya Nestler OTR/L 08/17/2012, 4:39 PM

## 2012-08-17 NOTE — Evaluation (Signed)
Physical Therapy Assessment and Plan  [name]: [id number] Date of Birth: [birth year]  PT Diagnosis: Abnormality of gait, Ataxia, Cognitive deficits, Coordination disorder, Difficulty walking, Hemiparesis dominant, Impaired cognition, Impaired sensation and Muscle weakness Rehab Potential: Good ELOS: 10 days   Today's Date: 08/17/2012 Time: 1003-1104 Time Calculation (min): 61 min  Problem List:  Patient Active Problem List   Diagnosis Date Noted  . Embolic cerebral infarction 08/16/2012  . Acute CVA (cerebrovascular accident) 08/13/2012  . 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 07/25/12 07/28/2012  . Hyperlipidemia LDL goal < 70 07/24/2012  . STEMI (ST elevation myocardial infarction), secondary to disease of RCA, with disease of LAD and diag. branch and chronic non dominant LCX occlusion for CABG  07/21/2012  . Essential hypertension 07/21/2012  . Tobacco abuse 07/21/2012    Past Medical History:  Past Medical History  Diagnosis Date  . Hypertension   . Hyperlipidemia LDL goal < 70 07/24/2012  . STEMI (ST elevation myocardial infarction), secondary to disease of RCA, with disease of LAD and diag. branch and chronic non dominant LCX occlusion for CABG  07/21/2012  . 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 07/25/12 07/28/2012    POSTOPERATIVE DIAGNOSIS: Severe 3-vessel coronary disease, status post  myocardial infarction.  PROCEDURE: Median sternotomy, extracorporeal circulation, coronary  artery bypass grafting x5 (left internal mammary artery to LAD,  sequential saphenous vein graft to obtuse marginal 1, obtuse marginal 2,  and posterior descending with Y graft to 1st diagonal), endoscopic vein  harvest, left thigh.    . Tobacco abuse 07/21/2012  . Stroke    Past Surgical History:  Past Surgical History  Procedure Laterality Date  . Coronary artery bypass graft N/A 07/25/2012    Procedure: CORONARY ARTERY BYPASS  GRAFTING (CABG) times five with Left  Endoscopic Saphenous Vein Harvest and Left Internal  Mammary ;  Surgeon: Loreli Slot, MD;  [facility];  Service: Open Heart Surgery;  Laterality: N/A;  . Tee without cardioversion N/A 08/15/2012    Procedure: TRANSESOPHAGEAL ECHOCARDIOGRAM (TEE);  Surgeon: Wendall Stade, MD;  Location: [facility] ENDOSCOPY;  Service: Cardiovascular;  Laterality: N/A;  . Loop recorder implant  08-15-2012    Medtronic LinQ implanted by Dr Johney Frame for cryptogenic stroke    Assessment & Plan Clinical Impression: Patient is a 62 y.o. year old right-handed female with history of hypertension, tobacco abuse, coronary artery disease/STEMI with coronary artery bypass grafting and recent discharge after her CABG 07/31/2012. Admitted 08/13/2012 with right-sided weakness and aphasia. MRI of the brain showed acute nonhemorrhagic left PCA territory infarct MRA of the head was severe disease distal left PCA and basilar artery grossly patent. Patient transferred to CIR on 08/16/2012 .   Patient currently requires mod with mobility secondary to muscle weakness, decreased visual acuity and decreased visual perceptual skills and decreased awareness, decreased problem solving, decreased safety awareness, decreased memory and delayed processing.  Prior to hospitalization, patient was modified independent  with mobility and lived with Daughter (mom) in a House home.  Home access is 4-5Stairs to enter.  Patient will benefit from skilled PT intervention to maximize safe functional mobility, minimize fall risk and decrease caregiver burden for planned discharge home with 24 hour supervision.  Anticipate patient will benefit from follow up HH at discharge.  PT - End of Session Activity Tolerance: Tolerates 30+ min activity without fatigue Endurance Deficit: No PT Assessment Rehab Potential: Good PT Plan PT Intensity: Minimum of 1-2  x/day ,45 to 90 minutes PT Frequency: 5 out of 7 days PT Duration  Estimated Length of Stay: 10 days PT Treatment/Interventions: Ambulation/gait training;Balance/vestibular training;Cognitive remediation/compensation;Discharge planning;Disease management/prevention;DME/adaptive equipment instruction;Functional electrical stimulation;Functional mobility training;Neuromuscular re-education;Pain management;Patient/family education;Stair training;Therapeutic Activities;Therapeutic Exercise;UE/LE Strength taining/ROM;UE/LE Coordination activities;Visual/perceptual remediation/compensation;Wheelchair propulsion/positioning PT Recommendation Follow Up Recommendations: Home health PT Patient destination: Home Equipment Details: TBD as pt progresses  Skilled Therapeutic Intervention Provided verbal and tactile cues for safety and technique when self propelling w/c, however with increased difficulty propelling using RUE, therefore utilized BLEs.  Performed BERG balance test (see details below) with score of 19/56, placing pt at very high risk for fall at this time.  Performed ambulation with and without RW.  Performed approx 120' with intermittent 1 to 2 person HHA with cues for using vision to determine placement of RLE.  Also cues for maintaining RUE by side as pt has a tendency to keep elbow flexed and close to her body.  Also began to educate pt on performing stairs using B handrails and stepping with R LE first to ensure that she can visually place foot on step and performing step to technique for increased safety. Also provided education on sternal precautions and also effects of stroke.   PT Evaluation Precautions/Restrictions Precautions Precautions: Sternal;Fall Precaution Comments: Pt unable to recall sternal precautions, re-educated, will provide handout. CABG 2 weeks prior Restrictions Weight Bearing Restrictions: No Pain Pain Assessment Pain Assessment: No/denies pain Pain Score: 0-No pain Home Living/Prior Functioning Home Living Available Help at  Discharge: Available 24 hours/day Type of Home: House Home Access: Stairs to enter [employer] of Steps: 4-5 Entrance Stairs-Rails: None Home Layout: One level Additional Comments: She has a cane and RW  Lives With: Daughter (mom) Prior Function Level of Independence: Independent with basic ADLs;Independent with transfers;Independent with gait  Able to Take Stairs?: Yes Driving: Yes     Cognition Overall Cognitive Status: Impaired/Different from baseline Arousal/Alertness: Awake/alert Orientation Level: Oriented to person;Oriented to place;Disoriented to time;Disoriented to situation Focused Attention: Appears intact Sustained Attention: Appears intact Selective Attention: Impaired Selective Attention Impairment: Functional basic Awareness: Impaired Problem Solving: Impaired Behaviors: Impulsive Safety/Judgment: Impaired Comments: During w/c mobility, pt would continuously lean too far foward and require some assist to prevent fall forward.  Sensation Sensation Light Touch: Impaired Detail Light Touch Impaired Details: Impaired RLE Proprioception: Impaired Detail Proprioception Impaired Details: Impaired RLE Additional Comments: Pt with absent light touch and pressure in RLE.  Also very inconsistent with proprioception testing.  Coordination Gross Motor Movements are Fluid and Coordinated: No Heel Shin Test: pt uncoordinated when performing R heel to shin test Motor  Motor Motor: Motor apraxia Motor - Skilled Clinical Observations: Pt unable to control placement of RLE/UE during many mobility tasks.    Mobility Bed Mobility Bed Mobility: Supine to Sit;Sit to Supine Supine to Sit: 5: Supervision Supine to Sit Details: Verbal cues for precautions/safety Sit to Supine: 5: Supervision Sit to Supine - Details: Verbal cues for precautions/safety Locomotion  Ambulation Ambulation: Yes Ambulation/Gait Assistance: 4: Min assist;3: Mod assist Ambulation Distance  (Feet): 120 Feet (x 2 reps) Assistive device: 1 person hand held assist;2 person hand held assist;Rolling walker Ambulation/Gait Assistance Details: Visual cues for safe use of DME/AE;Verbal cues for precautions/safety;Verbal cues for sequencing;Verbal cues for technique;Verbal cues for safe use of DME/AE Stairs / Additional Locomotion Stairs: Yes Stairs Assistance: 3: Mod assist Stairs Assistance Details: Tactile cues for sequencing;Tactile cues for placement;Verbal cues for sequencing;Verbal cues for technique;Verbal cues for precautions/safety Stair Management  Technique: Two rails Number of Stairs: 8 Wheelchair Mobility Distance: 100  Trunk/Postural Assessment  Cervical Assessment Cervical Assessment: Within Functional Limits Thoracic Assessment Thoracic Assessment: Within Functional Limits Lumbar Assessment Lumbar Assessment: Within Functional Limits Postural Control Postural Control: Within Functional Limits  Balance Balance Balance Assessed: Yes Standardized Balance Assessment Standardized Balance Assessment: Berg Balance Test Berg Balance Test Sit to Stand: Needs minimal aid to stand or to stabilize Standing Unsupported: Able to stand 2 minutes with supervision Sitting with Back Unsupported but Feet Supported on Floor or Stool: Able to sit safely and securely 2 minutes Stand to Sit: Sits independently, has uncontrolled descent Transfers: Needs one person to assist Standing Unsupported with Eyes Closed: Able to stand 10 seconds with supervision Standing Ubsupported with Feet Together: Needs help to attain position and unable to hold for 15 seconds From Standing, Reach Forward with Outstretched Arm: Loses balance while trying/requires external support From Standing Position, Pick up Object from Floor: Able to pick up shoe, needs supervision From Standing Position, Turn to Look Behind Over each Shoulder: Looks behind one side only/other side shows less weight shift Turn 360  Degrees: Needs assistance while turning Standing Unsupported, Alternately Place Feet on Step/Stool: Needs assistance to keep from falling or unable to try Standing Unsupported, One Foot in Front: Loses balance while stepping or standing Standing on One Leg: Unable to try or needs assist to prevent fall Total Score: 19 Extremity Assessment  RLE Assessment RLE Assessment: Exceptions to [facility] RLE Strength RLE Overall Strength: Deficits RLE Overall Strength Comments: hip flex 3/5, knee flex 4+/5, knee ext 4/5, ankle DF/PF 4/5 LLE Assessment LLE Assessment: Exceptions to [facility] LLE Strength LLE Overall Strength: Deficits LLE Overall Strength Comments: All WFL except hip flex 4/5  FIM:  FIM - Bed/Chair Transfer Bed/Chair Transfer: 5: Supine > Sit: Supervision (verbal cues/safety issues);5: Sit > Supine: Supervision (verbal cues/safety issues);4: Bed > Chair or W/C: Min A (steadying Pt. > 75%);4: Chair or W/C > Bed: Min A (steadying Pt. > 75%) FIM - Locomotion: Wheelchair Distance: 100 Locomotion: Wheelchair: 2: Travels 50 - 149 ft with minimal assistance (Pt.>75%) FIM - Locomotion: Ambulation Locomotion: Ambulation Assistive Devices: Designer, industrial/product (Performed with and without RW) Ambulation/Gait Assistance: 4: Min assist;3: Mod assist Locomotion: Ambulation: 2: Travels 50 - 149 ft with moderate assistance (Pt: 50 - 74%) (most min assist, mod assist when turning) FIM - Locomotion: Stairs Locomotion: Building control surveyor: Hand rail - 2 Locomotion: Stairs: 2: Up and Down 4 - 11 stairs with moderate assistance (Pt: 50 - 74%)   Refer to Care Plan for Long Term Goals  Recommendations for other services: None  Discharge Criteria: Patient will be discharged from PT if patient refuses treatment 3 consecutive times without medical reason, if treatment goals not met, if there is a change in medical status, if patient makes no progress towards goals or if patient is discharged from hospital.  The  above assessment, treatment plan, treatment alternatives and goals were discussed and mutually agreed upon: by patient  Vista Deck 08/17/2012, 12:35 PM

## 2012-08-18 ENCOUNTER — Inpatient Hospital Stay (HOSPITAL_COMMUNITY): Payer: Medicaid Other | Admitting: *Deleted

## 2012-08-18 ENCOUNTER — Inpatient Hospital Stay (HOSPITAL_COMMUNITY): Payer: 59 | Admitting: Rehabilitation

## 2012-08-18 ENCOUNTER — Inpatient Hospital Stay (HOSPITAL_COMMUNITY): Payer: Medicaid Other | Admitting: Speech Pathology

## 2012-08-18 ENCOUNTER — Inpatient Hospital Stay (HOSPITAL_COMMUNITY): Payer: Medicaid Other | Admitting: Occupational Therapy

## 2012-08-18 DIAGNOSIS — I6340 Cerebral infarction due to embolism of unspecified cerebral artery: Secondary | ICD-10-CM

## 2012-08-18 DIAGNOSIS — IMO0001 Reserved for inherently not codable concepts without codable children: Secondary | ICD-10-CM

## 2012-08-18 DIAGNOSIS — I10 Essential (primary) hypertension: Secondary | ICD-10-CM

## 2012-08-18 DIAGNOSIS — E1165 Type 2 diabetes mellitus with hyperglycemia: Secondary | ICD-10-CM

## 2012-08-18 LAB — BASIC METABOLIC PANEL
BUN: 4 mg/dL — ABNORMAL LOW (ref 6–23)
Calcium: 9.3 mg/dL (ref 8.4–10.5)
Creatinine, Ser: 0.57 mg/dL (ref 0.50–1.10)
GFR calc Af Amer: >90 mL/min (ref >90)

## 2012-08-18 NOTE — Progress Notes (Signed)
Physical Therapy Session Note  [name]: [id number] Date of Birth: 1950-11-26  Today's Date: 08/18/2012 Time: 0830-0900 Time Calculation (min): 30 min  Skilled Therapeutic Interventions/Progress Updates:    Patient received sitting in recliner. Session focused on gait training, core/trunk NMR, and weight bearing techniques to increase proprioception in R UE and R LE.  See details below. Patient returned to room and left seated in recliner with all needs within reach.  Therapy Documentation Precautions:  Precautions Precautions: Sternal;Fall Precaution Comments: Pt unable to recall sternal precautions, re-educated, will provide handout. CABG 2 weeks prior Restrictions Weight Bearing Restrictions: No Pain: Pain Assessment Pain Assessment: No/denies pain Pain Score: 0-No pain Locomotion : Ambulation Ambulation: Yes Ambulation/Gait Assistance: 4: Min assist Ambulation Distance (Feet): 205 Feet Assistive device: 1 person hand held assist Ambulation/Gait Assistance Details: Verbal cues for precautions/safety;Verbal cues for gait pattern;Tactile cues for posture;Tactile cues for weight shifting Ambulation/Gait Assistance Details: Patient instructed in gait training 205' x2 with R HHA in controlled and community-like environments (busy hallways with various people and obstalces to negotiate). Patient able to scan environment without cues to assist with negotiation of obstalces and does not experience any overt LOB. Gait Gait: Yes Gait Pattern: Step-through pattern;Narrow base of support;Decreased stride length  Other Treatments: Weight Bearing Technique Weight Bearing Technique: Yes RUE Weight Bearing Technique: High kneeling;Other (comment) LUE Weight Bearing Technique: High kneeling;Other (comment) Response to Weight Bearing Technique: Patient performed high/tall kneeling with B UE support on kay bench. In tall kneeling, performed 2 sets x10 reps tall  kneeling squats (patient starts in tall kneeling and squats down to sit on heels, then extends back into tall kneeling) to facilitate increased sensation and increased B glute activation and B hip extension. Patient performed half kneeling 1 x60" with L LE forward (R LE in kneeling position) to facilitate increased weight bearing, sensation, and proprioception in R LE. Patient initially requires R UE on kay bench for support, then able to progress to no UE support, but requires min guard for balance.  See FIM for current functional status  Therapy/Group: Individual Therapy  Chipper Herb. Trinidee Schrag, PT, DPT 08/18/2012, 12:14 PM

## 2012-08-18 NOTE — Progress Notes (Signed)
Physical Therapy Session Note  [name]: [id number] Date of Birth: 24-Dec-1950  Today's Date: 08/18/2012 Time: -4098-1191 Time Calculation (min): 60 mins  Short Term Goals: Week 1:   STG's =LTG's  Skilled Therapeutic Interventions/Progress Updates:   Focus of treatment session was ambulation, weight bearing through RLE and RUE during dynamic balance activity.   Performed high kneeling with small table in front of pt for weight bearing through RUE while planted on table and reaching for rings to promote increased WB and balance.  Had pt reaching with LUE to promote WB through RUE then using RUE to reach to decrease apraxic movements.   Ambulation x 180' x 2 reps at min assist without AD.  Cues for maintaining R foot in straight alignment with intermittent LOB to right.  Performed side stepping using handrail in hallway with cues for maintaining foot straight and using visual cues for proper placement of foot.  Also performed stepping to step and sideways for targets using RLE to step for accuracy and using LLE to increase WB through RLE.  Pt continues to have difficulty with proper placement on target (tends to overshoot).  Assisted pt in using restroom in her room with max cues for utilizing RUE as much as possible when toileting.  Pt able to use RUE to retrieve toilet paper, however unable to grasp pants to pull them up.  Discussed D/C planning with family (sister and cousin) and goals of supervision for mobility, HHPT for follow up and possible need for AD.    Therapy Documentation Precautions:  Precautions Precautions: Sternal;Fall Precaution Comments: Pt unable to recall sternal precautions, re-educated, will provide handout. CABG 2 weeks prior Restrictions Weight Bearing Restrictions: No   Pain: Pain Assessment Pain Assessment: No/denies pain Pain Score: 0-No pain Ambulation Ambulation: Yes Ambulation/Gait Assistance: 4: Min assist Ambulation Distance  (Feet): 205 Feet Assistive device: 1 person hand held assist Ambulation/Gait Assistance Details: Verbal cues for precautions/safety;Verbal cues for gait pattern;Tactile cues for posture;Tactile cues for weight shifting Ambulation/Gait Assistance Details: Patient instructed in gait training 205' x2 with R HHA in controlled and community-like environments (busy hallways with various people and obstalces to negotiate). Patient able to scan environment without cues to assist with negotiation of obstalces and does not experience any overt LOB. Gait Gait: Yes Gait Pattern: Step-through pattern;Narrow base of support;Decreased stride length   See FIM for current functional status  Therapy/Group: Individual Therapy  Vista Deck 08/18/2012, 12:26 PM

## 2012-08-18 NOTE — Progress Notes (Signed)
Social Work Assessment and Plan Social Work Assessment and Plan  [name]: [id number] Date of Birth: September 16, 1950  Today's Date: 08/18/2012  Problem List:  Patient Active Problem List   Diagnosis Date Noted  . Embolic cerebral infarction 08/16/2012  . Acute CVA (cerebrovascular accident) 08/13/2012  . 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 07/25/12 07/28/2012  . Hyperlipidemia LDL goal < 70 07/24/2012  . STEMI (ST elevation myocardial infarction), secondary to disease of RCA, with disease of LAD and diag. branch and chronic non dominant LCX occlusion for CABG  07/21/2012  . Essential hypertension 07/21/2012  . Tobacco abuse 07/21/2012   Past Medical History:  Past Medical History  Diagnosis Date  . Hypertension   . Hyperlipidemia LDL goal < 70 07/24/2012  . STEMI (ST elevation myocardial infarction), secondary to disease of RCA, with disease of LAD and diag. branch and chronic non dominant LCX occlusion for CABG  07/21/2012  . 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 07/25/12 07/28/2012    POSTOPERATIVE DIAGNOSIS: Severe 3-vessel coronary disease, status post  myocardial infarction.  PROCEDURE: Median sternotomy, extracorporeal circulation, coronary  artery bypass grafting x5 (left internal mammary artery to LAD,  sequential saphenous vein graft to obtuse marginal 1, obtuse marginal 2,  and posterior descending with Y graft to 1st diagonal), endoscopic vein  harvest, left thigh.    . Tobacco abuse 07/21/2012  . Stroke    Past Surgical History:  Past Surgical History  Procedure Laterality Date  . Coronary artery bypass graft N/A 07/25/2012    Procedure: CORONARY ARTERY BYPASS GRAFTING (CABG) times five with Left  Endoscopic Saphenous Vein Harvest and Left Internal  Mammary ;  Surgeon: Loreli Slot, MD;  Location: [facility] OR;  Service: Open Heart Surgery;  Laterality: N/A;  . Tee without cardioversion N/A 08/15/2012   Procedure: TRANSESOPHAGEAL ECHOCARDIOGRAM (TEE);  Surgeon: Wendall Stade, MD;  Location: [facility] ENDOSCOPY;  Service: Cardiovascular;  Laterality: N/A;  . Loop recorder implant  08-15-2012    Medtronic LinQ implanted by Dr Johney Frame for cryptogenic stroke   Social History:  reports that she has been smoking Cigarettes.  She has been smoking about 0.25 packs per day. She does not have any smokeless tobacco history on file. She reports that she does not drink alcohol or use illicit drugs.  Family / Support Systems Marital Status: Single Patient Roles: Parent;Other (Comment) (Siblings) Other Supports: Johann Capers  [phone]    Drinda Butts Dickens-sister  [phone] Anticipated Caregiver: Mom and handicapped daughter Ability/Limitations of Caregiver: Can only provide supervision level-Mom has health issues Caregiver Availability: 24/7 Family Dynamics: Close knit family pt has numerous siblings-five.  All are invovled and willing to assist but have jobs and work.  Mom is main caregiver but has health issues of her own.  Social History Preferred language: English Religion: None Cultural Background: No issues Education: High School Read: Yes Write: Yes Employment Status: Unemployed Fish farm manager Issues: No issues Guardian/Conservator: None-according to MD pt is capable of making her own decisions   Abuse/Neglect Physical Abuse: Denies Verbal Abuse: Denies Sexual Abuse: Denies Exploitation of patient/patient's resources: Denies Self-Neglect: Denies  Emotional Status Pt's affect, behavior adn adjustment status: Pt is motivated to improve she is pleasedwith how well her arm is moving now.  She couldn't not move her arm or leg prior to admission.  She wants to be as independent as possible before going home. Recent Psychosocial Issues: recent CABG still recovering from this and  has sternal precautions Pyschiatric History: No history-deferred depression screen due to pt felt not  necessary at this time.  Will monitor her coping while here, she is very positive about her recovery. Substance Abuse History: Tobacco-realizes she needs to quit, but has smoked a long time.  Aware of smoking cessation available to her.  Patient / Family Perceptions, Expectations & Goals Pt/Family understanding of illness & functional limitations: Pt and sister's have a good understanidng of her condition and deficits.  All are hopeful she will do well here.  Their Mom has health issues and can not do much for pt. 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."  [employer]: Other (Comment) (Applying for SSD and MA) Premorbid Home Care/DME Agencies: None Transportation available at discharge: [employer] referrals recommended: Support group (specify) ([facility] Support group)  Discharge Planning Living Arrangements: Parent Support Systems: Children;Parent;Other relatives;Friends/neighbors Type of Residence: Private residence Insurance Resources: Self-pay (Applying for [employer]) Financial Resources: Family Support;Other (Comment) (Applying for SSD) Financial Screen Referred: Yes Living Expenses: Lives with family Money Management: Family Does the patient have any problems obtaining your medications?: Yes (Describe) (No insurance goes to clinic in [place]) Home Management: Helps Mom and daughter Patient/Family Preliminary Plans: Return to Mom's home where she and 44yo handicapped daughter is there, so can provide supervision level.  Other siblings can help intermittently after work.  Pt was staying with them after CABG surgery. Social Work Anticipated Follow Up Needs: HH/OP;Support Group  Clinical  Impression Very motivated female who is willing to do what it takes to improve and regain her independent.  Supportive family who are willing to assist when can. Needs to be supervision level to return home with Mom and daughter.  She should reach this level but needs supervision for safety and communication issues.  Lucy Chris 08/18/2012, 10:53 AM

## 2012-08-18 NOTE — Progress Notes (Signed)
Occupational Therapy Session Note  [name]: [id number] Date of Birth: 1950/07/04  Today's Date: 08/18/2012 Time: 0901-1001 Time Calculation (min): 60 min  Short Term Goals: Week 1:  OT Short Term Goal 1 (Week 1): Pt will perform shower transfers with supervision. OT Short Term Goal 2 (Week 1): Pt will perform all bathing with supervision from shower level. OT Short Term Goal 3 (Week 1): Pt will perform toilet transfers with supervision. OT Short Term Goal 4 (Week 1): Pt will use the RUE as an active assist for bathing and grooming tasks with supervision. OT Short Term Goal 5 (Week 1): Pt will ambulate around room with supervision and no verbal cueing to locate all bathing and dressing items, including items in the right visual field.    Skilled Therapeutic Interventions/Progress Updates:    Pt performed bathing and dressing from shower level this am.  She needed min assist for balance while gathering items for dressing and bathing, without assistive device.  Pt overall continues to demonstrate some neglect and needs max instructional cueing to attempt to integrate the RUE functionally into reaching for items and helping to stabilize items for removing caps such as the toothpaste and deodorant.  She still demonstrates impaired sensation in the RUE as well and at times leaves it hanging off of the side of her shower chair and on one occasion had it hooked inside of the hand held shower cord and pulled the shower off of the wall without being aware of it.  She was able to perform dressing with min guard assist sit to stand except for TEDS which required mod assist from therapist.  Encouraged pt to work on placing the RUE on surfaces to help with sit to stand transitions and increase weightbearing.  Pt tends to hold the UE in a flexed position at the elbow most of the time.    Therapy Documentation Precautions:  Precautions Precautions: Sternal;Fall Precaution  Comments: Pt unable to recall sternal precautions, re-educated, will provide handout. CABG 2 weeks prior Restrictions Weight Bearing Restrictions: No   Pain: Pain Assessment Pain Assessment: No/denies pain Pain Score: 0-No pain ADL: See FIM for current functional status  Therapy/Group: Individual Therapy  Taegen Delker OTR/L 08/18/2012, 12:36 PM

## 2012-08-18 NOTE — Progress Notes (Signed)
Subjective/Complaints: No complaints. Has good appetite. Ready to get started again with therapies today.  A 12 point review of systems has been performed and if not noted above is otherwise negative.   Objective: Vital Signs: Blood pressure 150/98, pulse 68, temperature 97.8 F (36.6 C), temperature source Oral, resp. rate 19, height 5\' 4"  (1.626 m), weight 50.848 kg (112 lb 1.6 oz), SpO2 96.00%. No results found.  Recent Labs  08/16/12 1610 08/17/12 0620  WBC 6.8 6.6  HGB 10.3* 10.5*  HCT 30.5* 31.5*  PLT 341 334    Recent Labs  08/17/12 0620 08/18/12 0530  NA 142 141  K 2.9* 4.2  CL 107 109  GLUCOSE 106* 97  BUN 3* 4*  CREATININE 0.59 0.57  CALCIUM 9.3 9.3   CBG (last 3)  No results found for this basename: GLUCAP,  in the last 72 hours  Wt Readings from Last 3 Encounters:  08/17/12 50.848 kg (112 lb 1.6 oz)  08/13/12 53.6 kg (118 lb 2.7 oz)  08/13/12 53.6 kg (118 lb 2.7 oz)    Physical Exam:  HENT: dentition poor, oral mucosa pink and moist  Head: Normocephalic.  Eyes: EOM are normal.  Neck: Normal range of motion. Neck supple. No thyromegaly present.  Cardiovascular:  Cardiac rate controlled, no murmurs auscultated  Pulmonary/Chest: Breath sounds normal. No respiratory distress. No wheezes  Abdominal: Soft. Bowel sounds are normal. She exhibits no distension. No pain Neurological: She is alert.  Patient was able to name person, place and date of birth. She was a bit impulsive. Limited attention span. Follows three-step commands. ?visual field loss to right. Significant ataxia right arm and leg. Decreased PP and LT in right arm and leg as well. Reasonable insight and awareness when examiner was able to slow her down enough to have a conversation.  Skin:  Midline chest incision well-healed  Psychiatric: She has a normal mood and affect.   Assessment/Plan: 1. Functional deficits secondary to left PCA infarct which require 3+ hours per day of  interdisciplinary therapy in a comprehensive inpatient rehab setting. Physiatrist is providing close team supervision and 24 hour management of active medical problems listed below. Physiatrist and rehab team continue to assess barriers to discharge/monitor patient progress toward functional and medical goals. FIM: FIM - Bathing Bathing Steps Patient Completed: Chest;Right Arm;Left Arm;Abdomen;Front perineal area;Buttocks;Right upper leg;Left upper leg;Right lower leg (including foot);Left lower leg (including foot) Bathing: 4: Steadying assist  FIM - Upper Body Dressing/Undressing Upper body dressing/undressing: 0: Activity did not occur FIM - Lower Body Dressing/Undressing Lower body dressing/undressing: 4: Min-Patient completed 75 plus % of tasks  FIM - Toileting Toileting steps completed by patient: Adjust clothing prior to toileting;Performs perineal hygiene;Adjust clothing after toileting Toileting: 4: Steadying assist  FIM - Archivist Transfers: 4-To toilet/BSC: Min A (steadying Pt. > 75%);4-From toilet/BSC: Min A (steadying Pt. > 75%)  FIM - Bed/Chair Transfer Bed/Chair Transfer: 4: Bed > Chair or W/C: Min A (steadying Pt. > 75%);4: Chair or W/C > Bed: Min A (steadying Pt. > 75%)  FIM - Locomotion: Wheelchair Distance: 100 Locomotion: Wheelchair: 2: Travels 50 - 149 ft with minimal assistance (Pt.>75%) FIM - Locomotion: Ambulation Locomotion: Ambulation Assistive Devices: Designer, industrial/product (Performed with and without RW) Ambulation/Gait Assistance: 4: Min assist;3: Mod assist Locomotion: Ambulation: 2: Travels 50 - 149 ft with moderate assistance (Pt: 50 - 74%) (most min assist, mod assist when turning)  Comprehension Comprehension Mode: Auditory Comprehension: 4-Understands basic 75 - 89% of the  time/requires cueing 10 - 24% of the time  Expression Expression Mode: Verbal Expression: 3-Expresses basic 50 - 74% of the time/requires cueing 25 - 50% of the  time. Needs to repeat parts of sentences.  Social Interaction Social Interaction: 5-Interacts appropriately 90% of the time - Needs monitoring or encouragement for participation or interaction.  Problem Solving Problem Solving: 4-Solves basic 75 - 89% of the time/requires cueing 10 - 24% of the time  Memory Memory: 4-Recognizes or recalls 75 - 89% of the time/requires cueing 10 - 24% of the time  Medical Problem List and Plan:  1. 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  2. DVT Prophylaxis/Anticoagulation: Subcutaneous heparin. Follow platelets and for any signs of bleeding 3. Pain Management: Tylenol as needed.  4. Neuropsych: This patient is capable of making decisions on her own behalf.  5. 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 Followup cardiology services as needed. No chest pain or shortness of breath.  6. Hypertension. Clonidine 0.2 mg twice a day, lisinopril 10 mg daily, Lopressor 50 mg twice a day. Monitor with increased activity  7. Hyperlipidemia. Lipitor  8. Tobacco abuse. Counseling  9. Hypokalemia. 4.2 today--recheck next week.  LOS (Days) 2 A FACE TO FACE EVALUATION WAS PERFORMED  [name] [name] 08/18/2012 10:01 AM

## 2012-08-18 NOTE — Progress Notes (Signed)
Speech Language Pathology Daily Session Note  [name]: [id number] Date of Birth: 10-Aug-1950  Today's Date: 08/18/2012 Time: 9562-1308 Time Calculation (min): 40 min  Short Term Goals: Week 1: SLP Short Term Goal 1 (Week 1): Pt will name familiar items during functional tasks with min phonemic and semantic cues with 90% accuracy.  SLP Short Term Goal 2 (Week 1): Pt will attend to right side of environment/body during functional tasks with Min A verbal and question cues.   SLP Short Term Goal 3 (Week 1): Pt will follow 2 step commands with Min A verbal and question cues.  SLP Short Term Goal 4 (Week 1): Pt will demonstrate functional problem solving for basic and familair tasks with Min A verbal cues.  SLP Short Term Goal 5 (Week 1): Pt will demonstrate reading comprehension at the word level with 50% accuracy and Mod verbal and semantic cues.   Skilled Therapeutic Interventions: Treatment focus on cognitive-linguistic goals. SLP facilitated session by providing Max tactile, verbal and visual cues for attention to RUE and field of environment during functional task. Pt participated in generative naming task and required Mod phonemic and semantic cues. Pt performed task with 80% accuracy. Written aid was not effective in increasing naming/cueing due to visual deficits.  Pt demonstrated appropriate emergent awareness of difficulty of task. Pt required Mod question cues for safety awareness with transfer from wheelchair to recliner.   FIM:  Comprehension Comprehension Mode: Auditory Comprehension: 4-Understands basic 75 - 89% of the time/requires cueing 10 - 24% of the time Expression Expression: 3-Expresses basic 50 - 74% of the time/requires cueing 25 - 50% of the time. Needs to repeat parts of sentences. Social Interaction Social Interaction: 5-Interacts appropriately 90% of the time - Needs monitoring or encouragement for participation or interaction. Problem  Solving Problem Solving: 3-Solves basic 50 - 74% of the time/requires cueing 25 - 49% of the time Memory Memory: 4-Recognizes or recalls 75 - 89% of the time/requires cueing 10 - 24% of the time  Pain Pain Assessment Pain Assessment: No/denies pain  Therapy/Group: Individual Therapy  Ilsa Bonello 08/18/2012, 1:24 PM

## 2012-08-19 ENCOUNTER — Inpatient Hospital Stay (HOSPITAL_COMMUNITY): Payer: Medicaid Other | Admitting: Occupational Therapy

## 2012-08-19 ENCOUNTER — Inpatient Hospital Stay (HOSPITAL_COMMUNITY): Payer: Medicaid Other | Admitting: Speech Pathology

## 2012-08-19 ENCOUNTER — Inpatient Hospital Stay (HOSPITAL_COMMUNITY): Payer: Medicaid Other | Admitting: Physical Therapy

## 2012-08-19 MED ORDER — LISINOPRIL 20 MG PO TABS
20.0000 mg | ORAL_TABLET | Freq: Two times a day (BID) | ORAL | Status: DC
  Administered 2012-08-19 – 2012-08-26 (×14): 20 mg via ORAL
  Filled 2012-08-19 (×16): qty 1

## 2012-08-19 NOTE — Progress Notes (Signed)
Occupational Therapy Session Note  [name]: [id number] Date of Birth: 25-May-1950  Today's Date: 08/19/2012 Time: 7829-5621 Time Calculation (min): 45 min  Skilled Therapeutic Interventions/Progress Updates: ADL in w/c at sink with focus on bilateral integration and initiating using R ataxic, decreased proprioceptive UE with as much control and accuracy as possible. As well focused on dynamic standing and balanced self care tasks holding onto sink when needed. Patient frustrated at times when she had difficulty clearly articulating her thoughts.      Therapy Documentation Precautions: Sternal due to CABG    Pain:denied Pain Assessment Pain Assessment: No/denies pain Pain Score: 0-No pain  Therapy/Group: Individual Therapy  Bud Face [facility] 08/19/2012, 12:36 PM

## 2012-08-19 NOTE — Progress Notes (Signed)
Occupational Therapy Session Note  Patient Details  Name: Morghan Kester MRN: [id number] Date of Birth: July 14, 1950  Today's Date: 08/19/2012 Time: 4098-1191 Time Calculation (min): 45 min  Skilled Therapeutic Interventions/Progress Updates: During the afternoon session, Ms. Krabill participated in activities to address her R UE and LE ataxia, FM coordination, and dynamic standing balance deficits.  Patient demonstrated sternal precaution adherence with the activities she completed.  She voiced sensitivity in R forearm when it touched smooth cold (hi lo table) or rough towel type textures.     Therapy Documentation Precautions:  Precautions Precautions: Sternal;Fall Precaution Comments: Pt unable to recall sternal precautions, re-educated, will provide handout. CABG 2 weeks prior Restrictions Weight Bearing Restrictions: No  Pain:denied but voiced sensitivity in R forearm when it braised or moved along hi lo table and towel   See FIM for current functional status  Therapy/Group: Individual Therapy  Bud Face [facility] 08/19/2012, 3:23 PM

## 2012-08-19 NOTE — Progress Notes (Signed)
Physical Therapy Session Note  [name]: [id number] Date of Birth: [birth year]  Today's Date: 08/19/2012 Time: 1400-1500 Time Calculation (min): 60 min  Therapy Documentation Precautions:  Precautions Precautions: Sternal;Fall Precaution Comments: Pt unable to recall sternal precautions, re-educated, will provide handout. CABG 2 weeks prior Restrictions Weight Bearing Restrictions: No Pain: none  Gait Training:(15') ambulated S/CGA 2 x 250' with mild deviation toward right Therapeutic Activity:(15') Transfers sit<->stand S/Independent, picking objects off floor with S/Independence                                         Static stand balance and reaching tasks with R hand while playing checkers at tall table. Therapeutic Exercise:(30') Nu-Step with rest breaks every 30 to 60 seconds x 15', B LE exercises in sitting   Therapy/Group: Individual Therapy  Rex Kras 08/19/2012, 2:11 PM

## 2012-08-19 NOTE — Progress Notes (Addendum)
Subjective/Complaints: No problems reported.  A 12 point review of systems has been performed and if not noted above is otherwise negative.   Objective: Vital Signs: Blood pressure 186/85, pulse 61, temperature 98.1 F (36.7 C), temperature source Oral, resp. rate 20, height 5\' 4"  (1.626 m), weight 50.848 kg (112 lb 1.6 oz), SpO2 97.00%. No results found.  Recent Labs  08/16/12 1610 08/17/12 0620  WBC 6.8 6.6  HGB 10.3* 10.5*  HCT 30.5* 31.5*  PLT 341 334    Recent Labs  08/17/12 0620 08/18/12 0530  NA 142 141  K 2.9* 4.2  CL 107 109  GLUCOSE 106* 97  BUN 3* 4*  CREATININE 0.59 0.57  CALCIUM 9.3 9.3   CBG (last 3)  No results found for this basename: GLUCAP,  in the last 72 hours  Wt Readings from Last 3 Encounters:  08/17/12 50.848 kg (112 lb 1.6 oz)  08/13/12 53.6 kg (118 lb 2.7 oz)  08/13/12 53.6 kg (118 lb 2.7 oz)    Physical Exam:  HENT: dentition poor, oral mucosa pink and moist  Head: Normocephalic.  Eyes: EOM are normal.  Neck: Normal range of motion. Neck supple. No thyromegaly present.  Cardiovascular:  Cardiac rate controlled, no murmurs auscultated  Pulmonary/Chest: Breath sounds normal. No respiratory distress. No wheezes  Abdominal: Soft. Bowel sounds are normal. She exhibits no distension. No pain Neurological: She is alert.  Patient was able to name person, place and date of birth. She was a bit impulsive. Limited attention span. Follows three-step commands. ?visual field loss to right. Significant ataxia right arm and leg. Decreased PP and LT in right arm and leg as well. Reasonable insight and awareness when examiner was able to slow her down enough to have a conversation.  Skin:  Midline chest incision well-healed  Psychiatric: She has a normal mood and affect.   Assessment/Plan: 1. Functional deficits secondary to left PCA infarct which require 3+ hours per day of interdisciplinary therapy in a comprehensive inpatient rehab  setting. Physiatrist is providing close team supervision and 24 hour management of active medical problems listed below. Physiatrist and rehab team continue to assess barriers to discharge/monitor patient progress toward functional and medical goals. FIM: FIM - Bathing Bathing Steps Patient Completed: Chest;Right Arm;Left Arm;Abdomen;Front perineal area;Buttocks;Right upper leg;Left upper leg;Right lower leg (including foot);Left lower leg (including foot) Bathing: 4: Min-Patient completes 8-9 67f 10 parts or 75+ percent  FIM - Upper Body Dressing/Undressing Upper body dressing/undressing steps patient completed: Thread/unthread left sleeve of pullover shirt/dress;Put head through opening of pull over shirt/dress;Thread/unthread right sleeve of pullover shirt/dresss;Pull shirt over trunk Upper body dressing/undressing: 5: Supervision: Safety issues/verbal cues FIM - Lower Body Dressing/Undressing Lower body dressing/undressing steps patient completed: Thread/unthread right pants leg;Thread/unthread left pants leg;Pull pants up/down;Don/Doff right sock;Don/Doff left sock Lower body dressing/undressing: 4: Min-Patient completed 75 plus % of tasks  FIM - Toileting Toileting steps completed by patient: Adjust clothing prior to toileting;Performs perineal hygiene;Adjust clothing after toileting Toileting: 4: Steadying assist  FIM - Archivist Transfers: 4-To toilet/BSC: Min A (steadying Pt. > 75%);4-From toilet/BSC: Min A (steadying Pt. > 75%)  FIM - Bed/Chair Transfer Bed/Chair Transfer: 4: Bed > Chair or W/C: Min A (steadying Pt. > 75%);4: Chair or W/C > Bed: Min A (steadying Pt. > 75%)  FIM - Locomotion: Wheelchair Distance: 100 Locomotion: Wheelchair: 0: Activity did not occur FIM - Locomotion: Ambulation Locomotion: Ambulation Assistive Devices: Other (comment) (R HHA) Ambulation/Gait Assistance: 4: Min assist Locomotion: Ambulation: 4:  Travels 150 ft or more with minimal  assistance (Pt.>75%)  Comprehension Comprehension Mode: Auditory Comprehension: 4-Understands basic 75 - 89% of the time/requires cueing 10 - 24% of the time  Expression Expression Mode: Verbal Expression: 3-Expresses basic 50 - 74% of the time/requires cueing 25 - 50% of the time. Needs to repeat parts of sentences.  Social Interaction Social Interaction: 5-Interacts appropriately 90% of the time - Needs monitoring or encouragement for participation or interaction.  Problem Solving Problem Solving: 3-Solves basic 50 - 74% of the time/requires cueing 25 - 49% of the time  Memory Memory: 4-Recognizes or recalls 75 - 89% of the time/requires cueing 10 - 24% of the time  Medical Problem List and Plan:  1. 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  2. DVT Prophylaxis/Anticoagulation: Subcutaneous heparin. Follow platelets and for any signs of bleeding 3. Pain Management: Tylenol as needed.  4. Neuropsych: This patient is capable of making decisions on her own behalf.  5. 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 Followup cardiology services as needed. No chest pain or shortness of breath.  6. Hypertension. Clonidine 0.2 mg twice a day, lisinopril 20 mg daily---increase to 20mg  bid, Lopressor 50 mg twice a day. Continue to titrate as needed 7. Hyperlipidemia. Lipitor  8. Tobacco abuse. Counseling  9. Hypokalemia. 4.2 most recently--recheck next week.  LOS (Days) 3 A FACE TO FACE EVALUATION WAS PERFORMED  Zoriyah Scheidegger T 08/19/2012 9:11 AM

## 2012-08-19 NOTE — Progress Notes (Signed)
Speech Language Pathology Daily Session Note  [name]: [id number] Date of Birth: Mar 03, 1950  Today's Date: 08/19/2012 Time: 4098-1191 Time Calculation (min): 35 min  Short Term Goals: Week 1: SLP Short Term Goal 1 (Week 1): Pt will name familiar items during functional tasks with min phonemic and semantic cues with 90% accuracy.  SLP Short Term Goal 2 (Week 1): Pt will attend to right side of environment/body during functional tasks with Min A verbal and question cues.   SLP Short Term Goal 3 (Week 1): Pt will follow 2 step commands with Min A verbal and question cues.  SLP Short Term Goal 4 (Week 1): Pt will demonstrate functional problem solving for basic and familair tasks with Min A verbal cues.  SLP Short Term Goal 5 (Week 1): Pt will demonstrate reading comprehension at the word level with 50% accuracy and Mod verbal and semantic cues.   Skilled Therapeutic Interventions: Therapeutic intervention for cognitive-linguistic goals complete.  Patient was 35% accurate with generative naming activity using 2D objects, requiring phonemic cues for the remaining 65% of activity.  She did a little better (50% accurate) with generative naming of 3D objects.  She required min A gestural cues to complete activity that required attention to right side to complete task.  She worked very hard during therapy.  Continue with current treatment plan.    FIM:  Comprehension Comprehension Mode: Auditory Comprehension: 4-Understands basic 75 - 89% of the time/requires cueing 10 - 24% of the time Expression Expression Mode: Verbal Expression: 3-Expresses basic 50 - 74% of the time/requires cueing 25 - 50% of the time. Needs to repeat parts of sentences. Social Interaction Social Interaction: 5-Interacts appropriately 90% of the time - Needs monitoring or encouragement for participation or interaction.  Pain Pain Assessment Pain Assessment: No/denies pain  Therapy/Group:  Individual Therapy  Lenny Pastel 08/19/2012, 8:56 AM

## 2012-08-20 ENCOUNTER — Inpatient Hospital Stay (HOSPITAL_COMMUNITY): Payer: Medicaid Other | Admitting: Physical Therapy

## 2012-08-20 MED ORDER — TRAZODONE HCL 50 MG PO TABS
50.0000 mg | ORAL_TABLET | Freq: Every evening | ORAL | Status: DC | PRN
  Administered 2012-08-20 – 2012-08-25 (×5): 50 mg via ORAL
  Filled 2012-08-20 (×5): qty 1

## 2012-08-20 NOTE — Plan of Care (Signed)
Problem: RH SAFETY Goal: RH STG DECREASED RISK OF FALL WITH ASSISTANCE STG Decreased Risk of Fall With min Assistance.  Outcome: Progressing Calling appropriately, no attempts to get up without assist

## 2012-08-20 NOTE — Progress Notes (Signed)
Physical Therapy Note  [name]: [id number] Date of Birth: [birth year] Today's Date: 08/20/2012  1515-1610 (55 minutes) individual Pain: no complaint of pain Other: pulse 62 , Oxygen sats 98% RA (resting)  Focus of treatment: Gait training; therapeutic exercises focused on Rt LE strengthening/control/ activity tolerance/ standing balance Treatment : gait to/from room 120 feet min assist with occasional stagger to right ; Nustep level 3 X 10 minutes LEs only; gait over obstacles min assist with increased lean to right during stepping with LT LE and intermittent loss of balance to right; targeted stepping forwards and sideways min assist ; alternate stepping to 4 inch step min assist for safety with no loss of balance    [name],JIM 08/20/2012, 3:23 PM

## 2012-08-20 NOTE — Progress Notes (Signed)
Subjective/Complaints: A little anxious last night. Had a hard time sleeping.   A 12 point review of systems has been performed and if not noted above is otherwise negative.   Objective: Vital Signs: Blood pressure 187/92, pulse 60, temperature 97.5 F (36.4 C), temperature source Oral, resp. rate 18, height 5\' 4"  (1.626 m), weight 50.848 kg (112 lb 1.6 oz), SpO2 96.00%. No results found. No results found for this basename: WBC, HGB, HCT, PLT,  in the last 72 hours  Recent Labs  08/18/12 0530  NA 141  K 4.2  CL 109  GLUCOSE 97  BUN 4*  CREATININE 0.57  CALCIUM 9.3   CBG (last 3)  No results found for this basename: GLUCAP,  in the last 72 hours  Wt Readings from Last 3 Encounters:  08/17/12 50.848 kg (112 lb 1.6 oz)  08/13/12 53.6 kg (118 lb 2.7 oz)  08/13/12 53.6 kg (118 lb 2.7 oz)    Physical Exam:  HENT: dentition poor, oral mucosa pink and moist  Head: Normocephalic.  Eyes: EOM are normal.  Neck: Normal range of motion. Neck supple. No thyromegaly present.  Cardiovascular:  Cardiac rate controlled, no murmurs auscultated  Pulmonary/Chest: Breath sounds normal. No respiratory distress. No wheezes  Abdominal: Soft. Bowel sounds are normal. She exhibits no distension. No pain Neurological: She is alert.  Patient was able to name person, place and date of birth. She was a bit impulsive. Limited attention span. Follows three-step commands. ?visual field loss to right. Significant ataxia right arm and leg. Decreased PP and LT in right arm and leg as well. Reasonable insight and awareness when examiner was able to slow her down enough to have a conversation.  Skin:  Midline chest incision well-healed  Psychiatric: She has a normal mood and affect.   Assessment/Plan: 1. Functional deficits secondary to left PCA infarct which require 3+ hours per day of interdisciplinary therapy in a comprehensive inpatient rehab setting. Physiatrist is providing close team supervision  and 24 hour management of active medical problems listed below. Physiatrist and rehab team continue to assess barriers to discharge/monitor patient progress toward functional and medical goals. FIM: FIM - Bathing Bathing Steps Patient Completed: Chest;Right Arm;Left Arm;Abdomen;Front perineal area;Buttocks;Right upper leg;Left upper leg;Right lower leg (including foot);Left lower leg (including foot) Bathing: 5: Supervision: Safety issues/verbal cues  FIM - Upper Body Dressing/Undressing Upper body dressing/undressing steps patient completed: Thread/unthread right sleeve of pullover shirt/dresss;Pull shirt over trunk;Thread/unthread left sleeve of pullover shirt/dress;Put head through opening of pull over shirt/dress Upper body dressing/undressing: 5: Supervision: Safety issues/verbal cues FIM - Lower Body Dressing/Undressing Lower body dressing/undressing steps patient completed: Thread/unthread right pants leg;Thread/unthread left pants leg;Pull pants up/down;Fasten/unfasten pants;Don/Doff right sock;Don/Doff left sock Lower body dressing/undressing: 5: Set-up assist to: Don/Doff TED stocking  FIM - Toileting Toileting steps completed by patient: Adjust clothing prior to toileting;Performs perineal hygiene;Adjust clothing after toileting Toileting Assistive Devices: Grab bar or rail for support Toileting: 5: Supervision: Safety issues/verbal cues  FIM - Diplomatic Services operational officer Devices: Elevated toilet seat;Grab bars Toilet Transfers: 4-To toilet/BSC: Min A (steadying Pt. > 75%)  FIM - Bed/Chair Transfer Bed/Chair Transfer: 4: Bed > Chair or W/C: Min A (steadying Pt. > 75%);4: Chair or W/C > Bed: Min A (steadying Pt. > 75%)  FIM - Locomotion: Wheelchair Distance: 100 Locomotion: Wheelchair: 0: Activity did not occur FIM - Locomotion: Ambulation Locomotion: Ambulation Assistive Devices: Other (comment) (R HHA) Ambulation/Gait Assistance: 4: Min assist Locomotion:  Ambulation: 4: Travels 150 ft  or more with minimal assistance (Pt.>75%)  Comprehension Comprehension Mode: Auditory Comprehension: 4-Understands basic 75 - 89% of the time/requires cueing 10 - 24% of the time  Expression Expression Mode: Verbal Expression: 3-Expresses basic 50 - 74% of the time/requires cueing 25 - 50% of the time. Needs to repeat parts of sentences.  Social Interaction Social Interaction: 5-Interacts appropriately 90% of the time - Needs monitoring or encouragement for participation or interaction.  Problem Solving Problem Solving: 3-Solves basic 50 - 74% of the time/requires cueing 25 - 49% of the time  Memory Memory: 4-Recognizes or recalls 75 - 89% of the time/requires cueing 10 - 24% of the time  Medical Problem List and Plan:  1. 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  2. DVT Prophylaxis/Anticoagulation: Subcutaneous heparin. Follow platelets and for any signs of bleeding 3. Pain Management: Tylenol as needed.  4. Neuropsych: This patient is capable of making decisions on her own behalf.  5. 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 Followup cardiology services as needed. No chest pain or shortness of breath.  6. Hypertension. Clonidine 0.2 mg twice a day, lisinopril 20 mg daily---increased to 20mg  bid yesterday, Lopressor 50 mg twice a day. Continue to titrate as needed 7. Hyperlipidemia. Lipitor  8. Tobacco abuse. Counseling  9. Hypokalemia. 4.2 most recently--recheck this week  LOS (Days) 4 A FACE TO FACE EVALUATION WAS PERFORMED  Maurio Baize T 08/20/2012 8:18 AM

## 2012-08-21 ENCOUNTER — Inpatient Hospital Stay (HOSPITAL_COMMUNITY): Payer: Self-pay | Admitting: Rehabilitation

## 2012-08-21 ENCOUNTER — Inpatient Hospital Stay (HOSPITAL_COMMUNITY): Payer: Medicaid Other | Admitting: Occupational Therapy

## 2012-08-21 ENCOUNTER — Inpatient Hospital Stay (HOSPITAL_COMMUNITY): Payer: Medicaid Other | Admitting: Speech Pathology

## 2012-08-21 DIAGNOSIS — I10 Essential (primary) hypertension: Secondary | ICD-10-CM

## 2012-08-21 DIAGNOSIS — E1165 Type 2 diabetes mellitus with hyperglycemia: Secondary | ICD-10-CM

## 2012-08-21 DIAGNOSIS — I6340 Cerebral infarction due to embolism of unspecified cerebral artery: Secondary | ICD-10-CM

## 2012-08-21 NOTE — Progress Notes (Signed)
Physical Therapy Session Note  [name]: [id number] Date of Birth: Feb 04, 1950  Today's Date: 08/21/2012 Time: 1308-6578 Time Calculation (min): 46 min  Short Term Goals: Week 1:   STG's=LTGs  Skilled Therapeutic Interventions/Progress Updates:   Pt seated resting in recliner when PT arrived.  Ambulated 150' without AD at min assist for intermittent LOB to right side. Performed car transfer at supervision level with noted difficulty iniitating task.  Max cues for utilizing R hand has much as possible during entire session.  Performed standing with L LE on step to promote increased WB on RLE while reaching for targets (on checkerboard) with RUE.   Initiated HEP OTAGO handout provided and performed each exercise x 10 reps on BLE for focus of strengthening and increasing WB through RLE.  Pt with some difficulty following commands for exercises with mod cues.   Attempted initiation of SPC use in L hand with ambulation, however pt continues to require min assist at times for intermittent LOB and mod to max cues for sequencing/technique with SPC.    Therapy Documentation Precautions:  Precautions Precautions: Sternal;Fall Precaution Comments: Pt unable to recall sternal precautions, re-educated, will provide handout. CABG 2 weeks prior Restrictions Weight Bearing Restrictions: No   Vital Signs: Therapy Vitals Temp: 97.9 F (36.6 C) Temp src: Oral Pulse Rate: 51 Resp: 17 BP: 149/81 mmHg Patient Position, if appropriate: Lying Oxygen Therapy SpO2: 97 % O2 Device: None (Room air) Pain: Pain Assessment Pain Assessment: No/denies painPt states some sensitivity in R hand when trying to close car door.  Ambulation Ambulation/Gait Assistance: 4: Min assist (mostly min/guard, but intermittent min assist)  See FIM for current functional status  Therapy/Group: Individual Therapy  Vista Deck 08/21/2012, 12:43 PM

## 2012-08-21 NOTE — Progress Notes (Signed)
Speech Language Pathology Daily Session Note  [name]: [id number] Date of Birth: 06/12/1950  Today's Date: 08/21/2012 Time: 1100-1145 Time Calculation (min): 45 min  Short Term Goals: Week 1: SLP Short Term Goal 1 (Week 1): Pt will name familiar items during functional tasks with min phonemic and semantic cues with 90% accuracy.  SLP Short Term Goal 2 (Week 1): Pt will attend to right side of environment/body during functional tasks with Min A verbal and question cues.   SLP Short Term Goal 3 (Week 1): Pt will follow 2 step commands with Min A verbal and question cues.  SLP Short Term Goal 4 (Week 1): Pt will demonstrate functional problem solving for basic and familair tasks with Min A verbal cues.  SLP Short Term Goal 5 (Week 1): Pt will demonstrate reading comprehension at the word level with 50% accuracy and Mod verbal and semantic cues.   Skilled Therapeutic Interventions: Skilled treatment focused on cognitive-linguistic goals. SLP facilitated four-step sequencing task with additional wait time and minimal cues for accuracy. Pt required moderate-maximum semantic and phonemic cues for confrontational naming of items in pictures. She required minimal cues to attend to her right side. Continue plan of care.   FIM:  Comprehension Comprehension Mode: Auditory Comprehension: 5-Understands basic 90% of the time/requires cueing < 10% of the time Expression Expression Mode: Verbal Expression: 3-Expresses basic 50 - 74% of the time/requires cueing 25 - 50% of the time. Needs to repeat parts of sentences. Social Interaction Social Interaction: 5-Interacts appropriately 90% of the time - Needs monitoring or encouragement for participation or interaction. Problem Solving Problem Solving: 3-Solves basic 50 - 74% of the time/requires cueing 25 - 49% of the time Memory Memory: 3-Recognizes or recalls 50 - 74% of the time/requires cueing 25 - 49% of the  time  Pain Pain Assessment Pain Assessment: No/denies pain  Therapy/Group: Individual Therapy  Maxcine Ham 08/21/2012, 12:06 PM  Maxcine Ham, M.A. CCC-SLP

## 2012-08-21 NOTE — Progress Notes (Signed)
Occupational Therapy Session Note  [name]: [id number] Date of Birth: 1950-12-11  Today's Date: 08/21/2012 Time: 1000-1100 Time Calculation (min): 60 min  Short Term Goals: Week 1:  OT Short Term Goal 1 (Week 1): Pt will perform shower transfers with supervision. OT Short Term Goal 2 (Week 1): Pt will perform all bathing with supervision from shower level. OT Short Term Goal 3 (Week 1): Pt will perform toilet transfers with supervision. OT Short Term Goal 4 (Week 1): Pt will use the RUE as an active assist for bathing and grooming tasks with supervision. OT Short Term Goal 5 (Week 1): Pt will ambulate around room with supervision and no verbal cueing to locate all bathing and dressing items, including items in the right visual field.    Skilled Therapeutic Interventions/Progress Updates:    Pt performed sponge bath sit to stand at the sink for bathing per her request.  Pt gathered all supplies with min guard assist prior to starting bathing.  She continues to need mod encouragement to utilize the RUE into tasks including bathing.  She demonstrates motor apraxia when attempting to coordinate movements such as holding the washcloth and attempting to wash the LLE.  Needs increased time for grasping items from the sink such as the deodorant and toothpaste.  At conclusion of bathing had pt work on washing the wall with her RUE and a washcloth.  Progressed to working on grasping items from the cluttered sink and placing them on top of the glove box in her room.  Pt with increased difficulty with this task and tends to knock over objects when placed close together and does not completely grasp her hand all the way around the object she is reaching for.    Therapy Documentation Precautions:  Precautions Precautions: Sternal;Fall Precaution Comments:   Restrictions Weight Bearing Restrictions: No  Pain: Pain Assessment Pain Assessment: No/denies pain ADL: See FIM  for current functional status  Therapy/Group: Individual Therapy  Dilia Alemany OTR/L 08/21/2012, 12:28 PM

## 2012-08-21 NOTE — Progress Notes (Signed)
Physical Therapy Session Note  [name]: [id number] Date of Birth: [birth year]  Today's Date: 08/21/2012 Time: 1416-1500 Time Calculation (min): 44 min  Short Term Goals: Week 1:   STG's=LTG's  Skilled Therapeutic Interventions/Progress Updates:   Pt resting in recliner when PT arrived.  Seems fatigued but agreeable to therapy.  Ambulated 150' x 2 reps to/from gym at R HHA (min assist) with continued intermittent LOB to R (a few noted to L as well).  Performed flight of stairs with R handrail, going up half flight with R leg first then second half with L LE first.  Educated that pt is safer ascending with LLE and descending with RLE.  Performed tapping to cones x 10 reps BLE x 2 reps with rest break in between.  Focus on balance and increasing WB and controlled movements of RLE.  Technique did improve as she continued activity, but requires min assist throughout activity.  Performed R SL and pushing up using RUE for increasing RUE WB.  Performed x 10 reps x 2 sets.  Attempted to perform Nustep with BUE/LE, however pt only able to perform approx 3 mins and then stated she just didn't feel right, then stated she felt somewhat dizzy.  BP stable.  Assisted back to room and into restroom with mod cues for utilizing RUE as much as possible before assisting pt back to chair.  Call bell/phone in reach.    Therapy Documentation Precautions:  Precautions Precautions: Sternal;Fall Precaution Comments:   Restrictions Weight Bearing Restrictions: No   Locomotion : Ambulation Ambulation/Gait Assistance: 4: Min guard;4: Min assist   See FIM for current functional status  Therapy/Group: Individual Therapy  Vista Deck 08/21/2012, 3:23 PM

## 2012-08-21 NOTE — Progress Notes (Signed)
Patient ID: [name] [name], female   DOB: July 19, 1950, 63 y.o.   MRN: [id number] Subjective/Complaints: R arm weak, numb   A 12 point review of systems has been performed and if not noted above is otherwise negative.   Objective: Vital Signs: Blood pressure 149/81, pulse 51, temperature 97.9 F (36.6 C), temperature source Oral, resp. rate 17, height 5\' 4"  (1.626 m), weight 50.848 kg (112 lb 1.6 oz), SpO2 97.00%. No results found. No results found for this basename: WBC, HGB, HCT, PLT,  in the last 72 hours No results found for this basename: NA, K, CL, CO, GLUCOSE, BUN, CREATININE, CALCIUM,  in the last 72 hours CBG (last 3)  No results found for this basename: GLUCAP,  in the last 72 hours  Wt Readings from Last 3 Encounters:  08/17/12 50.848 kg (112 lb 1.6 oz)  08/13/12 53.6 kg (118 lb 2.7 oz)  08/13/12 53.6 kg (118 lb 2.7 oz)    Physical Exam:  HENT: dentition poor, oral mucosa pink and moist  Head: Normocephalic.  Eyes: EOM are normal.  Neck: Normal range of motion. Neck supple. No thyromegaly present.  Cardiovascular:  Cardiac rate controlled, no murmurs auscultated  Pulmonary/Chest: Breath sounds normal. No respiratory distress. No wheezes  Abdominal: Soft. Bowel sounds are normal. She exhibits no distension. No pain Neurological: She is alert.  Patient was able to name person, place and date of birth. Limited attention span. Follows three-step commands. ?visual field loss to right. Significant ataxia right arm and leg. Decreased PP and LT in right arm and leg as well.Skin:  Midline chest incision well-healed  Psychiatric: She has a normal mood and affect.   Assessment/Plan: 1. Functional deficits secondary to left PCA infarct which require 3+ hours per day of interdisciplinary therapy in a comprehensive inpatient rehab setting. Physiatrist is providing close team supervision and 24 hour management of active medical problems listed below. Physiatrist and rehab team continue  to assess barriers to discharge/monitor patient progress toward functional and medical goals. FIM: FIM - Bathing Bathing Steps Patient Completed: Chest;Right Arm;Left Arm;Abdomen;Front perineal area;Buttocks;Right upper leg;Left upper leg;Right lower leg (including foot);Left lower leg (including foot) Bathing: 5: Supervision: Safety issues/verbal cues  FIM - Upper Body Dressing/Undressing Upper body dressing/undressing steps patient completed: Thread/unthread right sleeve of pullover shirt/dresss;Pull shirt over trunk;Thread/unthread left sleeve of pullover shirt/dress;Put head through opening of pull over shirt/dress Upper body dressing/undressing: 5: Supervision: Safety issues/verbal cues FIM - Lower Body Dressing/Undressing Lower body dressing/undressing steps patient completed: Thread/unthread right pants leg;Thread/unthread left pants leg;Pull pants up/down;Fasten/unfasten pants;Don/Doff right sock;Don/Doff left sock Lower body dressing/undressing: 5: Set-up assist to: Don/Doff TED stocking  FIM - Toileting Toileting steps completed by patient: Adjust clothing prior to toileting;Performs perineal hygiene;Adjust clothing after toileting Toileting Assistive Devices: Grab bar or rail for support Toileting: 5: Supervision: Safety issues/verbal cues  FIM - Diplomatic Services operational officer Devices: Elevated toilet seat;Grab bars Toilet Transfers: 4-To toilet/BSC: Min A (steadying Pt. > 75%)  FIM - Bed/Chair Transfer Bed/Chair Transfer: 4: Bed > Chair or W/C: Min A (steadying Pt. > 75%);4: Chair or W/C > Bed: Min A (steadying Pt. > 75%)  FIM - Locomotion: Wheelchair Distance: 100 Locomotion: Wheelchair: 0: Activity did not occur FIM - Locomotion: Ambulation Locomotion: Ambulation Assistive Devices: Other (comment) (R HHA) Ambulation/Gait Assistance: 4: Min assist Locomotion: Ambulation: 4: Travels 150 ft or more with minimal assistance  (Pt.>75%)  Comprehension Comprehension Mode: Auditory Comprehension: 4-Understands basic 75 - 89% of the time/requires cueing 10 - 24%  of the time  Expression Expression Mode: Verbal Expression: 3-Expresses basic 50 - 74% of the time/requires cueing 25 - 50% of the time. Needs to repeat parts of sentences.  Social Interaction Social Interaction: 5-Interacts appropriately 90% of the time - Needs monitoring or encouragement for participation or interaction.  Problem Solving Problem Solving: 3-Solves basic 50 - 74% of the time/requires cueing 25 - 49% of the time  Memory Memory: 4-Recognizes or recalls 75 - 89% of the time/requires cueing 10 - 24% of the time  Medical Problem List and Plan:  1. 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  2. DVT Prophylaxis/Anticoagulation: Subcutaneous heparin. Follow platelets and for any signs of bleeding 3. Pain Management: Tylenol as needed.  4. Neuropsych: This patient is capable of making decisions on her own behalf.  5. 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 Followup cardiology services as needed. No chest pain or shortness of breath.  6. Hypertension. Clonidine 0.2 mg twice a day, lisinopril 20 mg daily---increased to 20mg  bid yesterday, Lopressor 50 mg twice a day. Continue to titrate as needed 7. Hyperlipidemia. Lipitor  8. Tobacco abuse. Counseling  9. Hypokalemia. 4.2 most recently--recheck this week  LOS (Days) 5 A FACE TO FACE EVALUATION WAS PERFORMED  KIRSTEINS,ANDREW E 08/21/2012 9:00 AM

## 2012-08-22 ENCOUNTER — Inpatient Hospital Stay (HOSPITAL_COMMUNITY): Payer: Medicaid Other | Admitting: Physical Therapy

## 2012-08-22 ENCOUNTER — Inpatient Hospital Stay (HOSPITAL_COMMUNITY): Payer: Medicaid Other | Admitting: Speech Pathology

## 2012-08-22 ENCOUNTER — Ambulatory Visit: Payer: Self-pay | Admitting: Thoracic Surgery (Cardiothoracic Vascular Surgery)

## 2012-08-22 ENCOUNTER — Inpatient Hospital Stay (HOSPITAL_COMMUNITY): Payer: Medicaid Other | Admitting: Occupational Therapy

## 2012-08-22 DIAGNOSIS — E1165 Type 2 diabetes mellitus with hyperglycemia: Secondary | ICD-10-CM

## 2012-08-22 DIAGNOSIS — I6340 Cerebral infarction due to embolism of unspecified cerebral artery: Secondary | ICD-10-CM

## 2012-08-22 DIAGNOSIS — I10 Essential (primary) hypertension: Secondary | ICD-10-CM

## 2012-08-22 MED ORDER — WHITE PETROLATUM GEL
Status: AC
Start: 2012-08-22 — End: 2012-08-22
  Filled 2012-08-22: qty 5

## 2012-08-22 NOTE — Progress Notes (Signed)
Speech Language Pathology Daily Session Note  [name]: [id number] Date of Birth: [birth year]  Today's Date: 08/22/2012 Time: 4098-1191 Time Calculation (min): 30 min  Short Term Goals: Week 1: SLP Short Term Goal 1 (Week 1): Pt will name familiar items during functional tasks with min phonemic and semantic cues with 90% accuracy.  SLP Short Term Goal 2 (Week 1): Pt will attend to right side of environment/body during functional tasks with Min A verbal and question cues.   SLP Short Term Goal 3 (Week 1): Pt will follow 2 step commands with Min A verbal and question cues.  SLP Short Term Goal 4 (Week 1): Pt will demonstrate functional problem solving for basic and familair tasks with Min A verbal cues.  SLP Short Term Goal 5 (Week 1): Pt will demonstrate reading comprehension at the word level with 50% accuracy and Mod verbal and semantic cues.   Skilled Therapeutic Interventions: Treatment focus on speech goals. Upon entering the room, the pt was asleep but was easily aroused. Pt reported she was not feeling well but demonstrated difficulty in describing details (pain, etc.).  Pt participated in generative naming task with a specific category. Pt required total A to complete task so task was modified to include a broader category, however, pt continued to require total A for naming. Pt demonstrated emergent awareness into difficulty of task and reported, "I just aint doin today." Educated pt on how her fatigue and not feeling well impacts her verbal expression and overall functional communication. Treatment session ended early due pt fatigue (reported to RN of pt not feeling well, when clinician returned, pt was asleep).    FIM:  Comprehension Comprehension Mode: Auditory Comprehension: 5-Understands basic 90% of the time/requires cueing < 10% of the time Expression Expression: 3-Expresses basic 50 - 74% of the time/requires cueing 25 - 50% of the time. Needs to  repeat parts of sentences. Social Interaction Social Interaction: 4-Interacts appropriately 75 - 89% of the time - Needs redirection for appropriate language or to initiate interaction. Problem Solving Problem Solving: 4-Solves basic 75 - 89% of the time/requires cueing 10 - 24% of the time Memory Memory: 4-Recognizes or recalls 75 - 89% of the time/requires cueing 10 - 24% of the time  Pain No/Denies Pain  Therapy/Group: Individual Therapy  Mignonne Afonso 08/22/2012, 11:58 AM

## 2012-08-22 NOTE — Progress Notes (Signed)
Physical Therapy Session Note  Patient Details  Name: [name] MRN: [id number] Date of Birth: 1950/06/20  Today's Date: 08/22/2012 Time: 0800-0900            1300- 1330 Time Calculation (min): 60 min                                              30 min  Short Term Goals: Week 1:   =LTGs  Skilled Therapeutic Interventions/Progress Updates:    Pt seen in two sessions today.    First session: Pt reports not feeling well this morning but is agreeable to perform therapy, nsg notified of pt c/o. Pt performed transfer training supine <> sit with supervision assist and stand pivot or sit to stand from various surfaces with min assist as pt displays LOB upon initial stance.  Gait training without AD with HHA min assist with slight inattention to R side and intermittent LOB noted with 25% assist from therapist to recover.  Supine and SL strengthening and coordination exercises to B LE 2 x 10 reps with tactile and verbal cues to complete.  Standing static and dynamic balance and reaching activities > to the R to improve R UE function and WB and control through R LE. Attempted toe tapping with use of various colors with pt unable to accurately identify colors required mod assist, switched to use of numbers and pt able to complete 3 step sequence, unable to complete 4 step sequence with multiple attempts.  Pt reported feeling slightly better at end of session.  Second session: pt reports still feeling a little poorly but again agreeable to therapy.  Pt performed gait training without AD HHA/ min assist with various speeds, stop and go and head movements, LOB noted > with head movements to the L.  Nu step x 5 mins a level 2 with intermittent assist to improve grip with R UE, no c/o from pt on discomfort.  Standing B LE strengthening with HHA 2 x 10 reps.  Pt very motivated.  Therapy Documentation Precautions:  Precautions Precautions: Sternal;Fall Precaution Comments:   Restrictions Weight Bearing  Restrictions: No    Pain: No pain, denies pain.      Locomotion : Ambulation Ambulation/Gait Assistance: 4: Min guard;4: Min assist                See FIM for current functional status  Therapy/Group: Individual Therapy  Jackelyn Knife 08/22/2012, 4:22 PM

## 2012-08-22 NOTE — Progress Notes (Signed)
Pt reports she " just doesn't feel right today."  Per SLP, increased difficulty with speech, word finding.  VSS, see FS;  Denied any discomfort, burning with urination, urine clear.  Harvel Ricks [facility] aware, saw pt, monitor.

## 2012-08-22 NOTE — Progress Notes (Signed)
Patient ID: [name] [name], female   DOB: 1950-04-15, 62 y.o.   MRN: [id number] Subjective/Complaints: No new issues A 12 point review of systems has been performed and if not noted above is otherwise negative.   Objective: Vital Signs: Blood pressure 163/85, pulse 75, temperature 97.9 F (36.6 C), temperature source Oral, resp. rate 19, height 5\' 4"  (1.626 m), weight 50.848 kg (112 lb 1.6 oz), SpO2 95.00%. No results found. No results found for this basename: WBC, HGB, HCT, PLT,  in the last 72 hours No results found for this basename: NA, K, CL, CO, GLUCOSE, BUN, CREATININE, CALCIUM,  in the last 72 hours CBG (last 3)  No results found for this basename: GLUCAP,  in the last 72 hours  Wt Readings from Last 3 Encounters:  08/17/12 50.848 kg (112 lb 1.6 oz)  08/13/12 53.6 kg (118 lb 2.7 oz)  08/13/12 53.6 kg (118 lb 2.7 oz)    Physical Exam:  HENT: dentition poor, oral mucosa pink and moist  Head: Normocephalic.  Eyes: EOM are normal.  Neck: Normal range of motion. Neck supple. No thyromegaly present.  Cardiovascular:  Cardiac rate controlled, no murmurs auscultated  Pulmonary/Chest: Breath sounds normal. No respiratory distress. No wheezes  Abdominal: Soft. Bowel sounds are normal. She exhibits no distension. No pain Neurological: She is alert.  Patient was able to name person, place and date of birth. Limited attention span. Follows three-step commands. ?visual field loss to right. Significant ataxia right arm and leg. Decreased PP and LT in right arm and leg as well.Skin:  Midline chest incision well-healed  Psychiatric: She has a normal mood and affect.   Assessment/Plan: 1. Functional deficits secondary to left PCA infarct which require 3+ hours per day of interdisciplinary therapy in a comprehensive inpatient rehab setting. Physiatrist is providing close team supervision and 24 hour management of active medical problems listed below. Physiatrist and rehab team continue to  assess barriers to discharge/monitor patient progress toward functional and medical goals. FIM: FIM - Bathing Bathing Steps Patient Completed: Chest;Right Arm;Left Arm;Abdomen;Front perineal area;Buttocks;Right upper leg;Left upper leg;Right lower leg (including foot);Left lower leg (including foot) Bathing: 5: Supervision: Safety issues/verbal cues  FIM - Upper Body Dressing/Undressing Upper body dressing/undressing steps patient completed: Thread/unthread right sleeve of pullover shirt/dresss;Thread/unthread left sleeve of pullover shirt/dress;Put head through opening of pull over shirt/dress;Pull shirt over trunk Upper body dressing/undressing: 5: Supervision: Safety issues/verbal cues FIM - Lower Body Dressing/Undressing Lower body dressing/undressing steps patient completed: Thread/unthread right pants leg;Thread/unthread left pants leg;Pull pants up/down;Don/Doff right sock;Don/Doff left sock Lower body dressing/undressing: 5: Set-up assist to: Don/Doff TED stocking  FIM - Toileting Toileting steps completed by patient: Adjust clothing prior to toileting;Performs perineal hygiene;Adjust clothing after toileting Toileting Assistive Devices: Grab bar or rail for support Toileting: 5: Supervision: Safety issues/verbal cues  FIM - Diplomatic Services operational officer Devices: Elevated toilet seat;Grab bars Toilet Transfers: 4-To toilet/BSC: Min A (steadying Pt. > 75%)  FIM - Bed/Chair Transfer Bed/Chair Transfer: 4: Bed > Chair or W/C: Min A (steadying Pt. > 75%);4: Chair or W/C > Bed: Min A (steadying Pt. > 75%)  FIM - Locomotion: Wheelchair Distance: 100 Locomotion: Wheelchair: 0: Activity did not occur FIM - Locomotion: Ambulation Locomotion: Ambulation Assistive Devices:  (R HHA) Ambulation/Gait Assistance: 4: Min guard;4: Min assist Locomotion: Ambulation: 4: Travels 150 ft or more with minimal assistance (Pt.>75%)  Comprehension Comprehension Mode:  Auditory Comprehension: 5-Understands complex 90% of the time/Cues < 10% of the time  Expression Expression Mode:  Verbal Expression: 4-Expresses basic 75 - 89% of the time/requires cueing 10 - 24% of the time. Needs helper to occlude trach/needs to repeat words.  Social Interaction Social Interaction: 6-Interacts appropriately with others with medication or extra time (anti-anxiety, antidepressant).  Problem Solving Problem Solving: 4-Solves basic 75 - 89% of the time/requires cueing 10 - 24% of the time  Memory Memory: 4-Recognizes or recalls 75 - 89% of the time/requires cueing 10 - 24% of the time  Medical Problem List and Plan:  1. 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  2. DVT Prophylaxis/Anticoagulation: Subcutaneous heparin. Follow platelets and for any signs of bleeding 3. Pain Management: Tylenol as needed.  4. Neuropsych: This patient is capable of making decisions on her own behalf.  5. 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 Followup cardiology services as needed. No chest pain or shortness of breath.  6. Hypertension. Clonidine 0.2 mg twice a day, lisinopril 20 mg daily---increased to 20mg  bid yesterday, Lopressor 50 mg twice a day. Continue to titrate as needed 7. Hyperlipidemia. Lipitor  8. Tobacco abuse. Counseling  9. Hypokalemia. 4.2 most recently--recheck this week  LOS (Days) 6 A FACE TO FACE EVALUATION WAS PERFORMED  Erick Colace 08/22/2012 8:50 AM

## 2012-08-22 NOTE — Progress Notes (Signed)
Occupational Therapy Session Note  [name]: [id number] Date of Birth: 11-12-1950  Today's Date: 08/22/2012 Time: 0900-1000 Time Calculation (min): 60 min  Short Term Goals: Week 1:  OT Short Term Goal 1 (Week 1): Pt will perform shower transfers with supervision. OT Short Term Goal 2 (Week 1): Pt will perform all bathing with supervision from shower level. OT Short Term Goal 3 (Week 1): Pt will perform toilet transfers with supervision. OT Short Term Goal 4 (Week 1): Pt will use the RUE as an active assist for bathing and grooming tasks with supervision. OT Short Term Goal 5 (Week 1): Pt will ambulate around room with supervision and no verbal cueing to locate all bathing and dressing items, including items in the right visual field.    Skilled Therapeutic Interventions/Progress Updates:    Pt performed bathing and dressing to begin session.  Needs mod instructional cueing to integrate the RUE into the bathing and dressing tasks.  Still with moderate apraxia and decreased motor planning when attempting to coordinate movements in the hand.  Pt needs mod instructional cueing to sequence the shower as well.  Overall close supervision for bathing and dressing but with limited use of the RUE to assist with these tasks.  Issued pt red foam grips to use over utensils and for toothbrush to encourage RUE functional use.  At end of session use word search for table top scanning task to work on right visual field deficit.  Pt educated on compensation strategy of using her finger and organized pattern of scanning.  Took her approximately 5 mins to locate the first word on the right side of the page.  Therapy Documentation Precautions:  Precautions Precautions: Sternal;Fall Precaution Comments:   Restrictions Weight Bearing Restrictions: No  Vital Signs: Therapy Vitals Pulse Rate: 56 Resp: 18 BP: 144/83 mmHg Patient Position, if appropriate: Sitting Oxygen  Therapy SpO2: 98 % O2 Device: None (Room air) Pain: Pain Assessment Pain Assessment: No/denies pain ADL: See FIM for current functional status  Therapy/Group: Individual Therapy  Bridger Pizzi OTR/L 08/22/2012, 12:37 PM

## 2012-08-23 ENCOUNTER — Inpatient Hospital Stay (HOSPITAL_COMMUNITY): Payer: Medicaid Other | Admitting: Rehabilitation

## 2012-08-23 ENCOUNTER — Inpatient Hospital Stay (HOSPITAL_COMMUNITY): Payer: Medicaid Other | Admitting: Occupational Therapy

## 2012-08-23 ENCOUNTER — Inpatient Hospital Stay (HOSPITAL_COMMUNITY): Payer: Medicaid Other | Admitting: Speech Pathology

## 2012-08-23 NOTE — Progress Notes (Signed)
Physical Therapy Session Note  [name]: [id number] Date of Birth: 27-Oct-1950  Today's Date: 08/23/2012 Time: 4098-1191 Time Calculation (min): 31 min  Short Term Goals: Week 1:   STG's=LTGs.   Skilled Therapeutic Interventions/Progress Updates:   Had pt ambulate >150' to ADL apartment at min/guard assist with mod cues for relaxing RUE as she continues to keep RUE somewhat flexed as she ambulates.  Focus of session was to utilize RUE to reach for items called out by therapist and side step for increased balance, weight shifting and RLE weight bearing.  Note that pt has increased difficulty when asked to pick item with many words on label.  Also note that pt would perseverate on certain item, despite max cues for picking called out item.  Pt with memory issues during session as well as word finding issues.  Following placing items on counter, PT placed items on floor for pt to pick up.  Able to perform at supervision level with cues for placing item back in cabinet.  Also focused on controlled RLE movements forward/backward, side/side with R foot on knee skateboard.  Provided visual cues for maintaining straight line.    Therapy Documentation Precautions:  Precautions Precautions: Sternal;Fall Precaution Comments:   Restrictions Weight Bearing Restrictions: No   Pain: Pain Assessment Pain Assessment: No/denies pain   Locomotion : Ambulation Ambulation/Gait Assistance: 4: Min guard;5: Supervision   See FIM for current functional status  Therapy/Group: Individual Therapy  Vista Deck 08/23/2012, 1:37 PM

## 2012-08-23 NOTE — Progress Notes (Signed)
Physical Therapy Session Note  [name]: [id number] Date of Birth: 1950/09/19  Today's Date: 08/23/2012 Time: 0931-1028 Time Calculation (min): 57 min  Short Term Goals: Week 2:   LTG's =STG's  Skilled Therapeutic Interventions/Progress Updates:   Performed >150' ambulation in hallways, starting with straight path focusing on loose R/L UE (RUE loosly supported) with intermittent min/guard to min assist for mild LOB.  Pt mostly min/guard however when distracted, note that she does not focus on RLE placement and tends to get feet tangled with cues for focusing on steps.  Next bought of ambulation focused on head turns right/left and up/down at min assist initially then min/guard as she adjusted.  High level balance with ambulation performed while kicking bean bag with R and L LE.  Requires mostly min assist for task with focus on controlled kicks with RLE.  Performed modified SLS in // bars on small ball to promote increased WB through RLE and moving ball with LLE forward/backward and side/side.  Also switched to standing on LLE for controlled movements of RLE forward/backward and side/side.  Standing on foam pad with feet apart, feet together (requires mod assist for placing feet as close together as possible) performing head turns right/left and up/down.  At end of session performed Nustep x 3 mins at level 2 resistance with LEs only.  Pt doing much better with acknowledging RLE on pedal this session.   Therapy Documentation Precautions:  Precautions Precautions: Sternal;Fall Precaution Comments:   Restrictions Weight Bearing Restrictions: No General:   Vital Signs: Therapy Vitals Pulse Rate: 80 Pain:  No report of pain during session.   See FIM for current functional status  Therapy/Group: Individual Therapy  Vista Deck 08/23/2012, 10:36 AM

## 2012-08-23 NOTE — Progress Notes (Signed)
Occupational Therapy Session Note  [name]: [id number] Date of Birth: 1951/01/19  Today's Date: 08/23/2012 Time: 0800-0900 Time Calculation (min): 60 min  Short Term Goals: Week 1:  OT Short Term Goal 1 (Week 1): Pt will perform shower transfers with supervision. OT Short Term Goal 2 (Week 1): Pt will perform all bathing with supervision from shower level. OT Short Term Goal 3 (Week 1): Pt will perform toilet transfers with supervision. OT Short Term Goal 4 (Week 1): Pt will use the RUE as an active assist for bathing and grooming tasks with supervision. OT Short Term Goal 5 (Week 1): Pt will ambulate around room with supervision and no verbal cueing to locate all bathing and dressing items, including items in the right visual field.    Skilled Therapeutic Interventions/Progress Updates:  Patient resting in recliner upon arrival.  Self care retraining to include shower, dress and groom.  Focused session on visual scanning due to right inattention, dynamic balance and forced use of RUE during all tasks to include gather supplies to prepare for bath and dress then clean up/straighten up while ambulating, standing, squatting and reaching to pick up items from the floor.  RUE motor re-learning to include coordination, attention to right hand while it is at work and learning to grade her movement.  After several tries, patient able to monitor her movements to unscrew and screw on the top to her deodorant.  Therapy Documentation Precautions:  Precautions Precautions: Sternal;Fall Precaution Comments:   Restrictions Weight Bearing Restrictions: No Pain: Denies pain except when overuses right hand and does not grade her movement.  Training resulted in no pain with right hand use.  Education is ongoing. ADL: See FIM for current functional status  Therapy/Group: Individual Therapy  Carrine Kroboth 08/23/2012, 9:34 AM

## 2012-08-23 NOTE — Patient Care Conference (Signed)
Inpatient RehabilitationTeam Conference and Plan of Care Update Date: 08/23/2012   Time: 11;15 Am    Patient Name: [name] [name]      Medical Record Number: [id number]  Date of Birth: [birth year] Sex: Female         Room/Bed: 4W03C/4W03C-01 Payor Info: Payor: MEDICAID PENDING / Plan: MEDICAID PENDING / Product Type: *No Product type* /    Admitting Diagnosis: CVA  Admit Date/Time:  08/16/2012  3:12 PM Admission Comments: No comment available   Primary Diagnosis:  Embolic cerebral infarction Principal Problem: Embolic cerebral infarction  Patient Active Problem List   Diagnosis Date Noted  . Embolic cerebral infarction 08/16/2012  . Acute CVA (cerebrovascular accident) 08/13/2012  . 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 07/25/12 07/28/2012  . Hyperlipidemia LDL goal < 70 07/24/2012  . STEMI (ST elevation myocardial infarction), secondary to disease of RCA, with disease of LAD and diag. branch and chronic non dominant LCX occlusion for CABG  07/21/2012  . Essential hypertension 07/21/2012  . Tobacco abuse 07/21/2012    Expected Discharge Date: Expected Discharge Date: 08/26/12  Team Members Present: Physician leading conference: Dr. Claudette Laws Social Worker Present: Dossie Der, LCSW Nurse Present: Ethelene Browns, RN PT Present: Edman Circle, PT;Becky Henrene Dodge, PT;Bridgett Ripa, PT;Caroline Sonia Side, PT OT Present: Rosalio Loud, OT;Kris Gellert, Carollee Sires, OT SLP Present: Fae Pippin, SLP     Current Status/Progress Goal Weekly Team Focus  Medical   Vitals signs are stable, sensory deficits on the right side severe.  Maintain medical stability to allow full participation in rehabilitation program  See above   Bowel/Bladder   Pt continent of B&B  pt will remain continent of B&B  pt will remain continent of B&B   Swallow/Nutrition/ Hydration     WFL        ADL's   Pt currently needs min to min guard assist for transfers  without use of assistive device.  Overall supervision level for most selfcare tasks but needs cueing to sequence.  Noted apraxia and decreased coordination with RUE use, especially in the hand.   Also demonstrates moderate left visual field deficit.  Goals overall supervision to modified independent level.  Supervision for meal prep and tub shower transfers.  visual compensation, RUE functional use and coordination, sensory awareness, balance, education   Mobility   Currently min/guard to min assist for ambulation with loose R HHA.  More Min assist when distracted with mod to max cues for coordination/use of RLE/UE.    Goals are overall supervision  Continue to focus on RUE/LE functional use and coordination, balance, education and safety   Communication   Mod A  Min A  utilization of word-finding strategies, naming, functional communication   Safety/Cognition/ Behavioral Observations  Min A  Supervision  attention to right field of enviornment/body, awareness, problem solving    Pain   pt denies pain  pain < 3  assess and monitor pain qshift and prn   Skin   pt skin free from breakdown/infection  skin will remain free from infection/breakdown  assess and monitor skin qshift and prn      *See Care Plan and progress notes for long and short-term goals.  Barriers to Discharge: Neurologic deficits are not clearing    Possible Resolutions to Barriers:  Continue rehabilitation program    Discharge Planning/Teaching Needs:  HOme with Mom and handicapped daughter who can provide supervision level-sister working on disability and Medicaid applications      Team Discussion:  Word finding and problem solving issues-sensory deficits. Pain in hand-managing.  When distracted falls to the right.  Need family education prior to discharge  Revisions to Treatment Plan:  None   Continued Need for Acute Rehabilitation Level of Care: The patient requires daily medical management by a physician with  specialized training in physical medicine and rehabilitation for the following conditions: Daily direction of a multidisciplinary physical rehabilitation program to ensure safe treatment while eliciting the highest outcome that is of practical value to the patient.: Yes Daily medical management of patient stability for increased activity during participation in an intensive rehabilitation regime.: Yes Daily analysis of laboratory values and/or radiology reports with any subsequent need for medication adjustment of medical intervention for : Neurological problems  Kioni Stahl, [name] 08/23/2012, 3:53 PM

## 2012-08-23 NOTE — Progress Notes (Signed)
Speech Language Pathology Daily Session Note  Patient Details  Name: Shaasia Odle MRN: [id number] Date of Birth: 28-Jan-1951  Today's Date: 08/23/2012 Time: 1040-1120 Time Calculation (min): 40 min  Short Term Goals: Week 1: SLP Short Term Goal 1 (Week 1): Pt will name familiar items during functional tasks with min phonemic and semantic cues with 90% accuracy.  SLP Short Term Goal 2 (Week 1): Pt will attend to right side of environment/body during functional tasks with Min A verbal and question cues.   SLP Short Term Goal 3 (Week 1): Pt will follow 2 step commands with Min A verbal and question cues.  SLP Short Term Goal 4 (Week 1): Pt will demonstrate functional problem solving for basic and familair tasks with Min A verbal cues.  SLP Short Term Goal 5 (Week 1): Pt will demonstrate reading comprehension at the word level with 50% accuracy and Mod verbal and semantic cues.   Skilled Therapeutic Interventions: Treatment focus on speech goals. Upon entering the room, the pt was awake and alert and appeared much brighter than yesterday. SLP facilitated session by providing Mod A phonemic and semantic cues for generative naming task within a specific category. Pt also required Mod verbal cues for thought organization to increase naming and for overall utilization of word-finding strategies. Pt continues to demonstrate increased verbal expression/functional communication with spontaneous and informal communication compared to verbal expression with structured tasks.  Pt continues to demonstrate appropriate emergent awareness of verbal errors and requires extra time and Min-Mod A to self-correct.     FIM:  Comprehension Comprehension Mode: Auditory Comprehension: 5-Understands basic 90% of the time/requires cueing < 10% of the time Expression Expression Mode: Verbal Expression: 3-Expresses basic 50 - 74% of the time/requires cueing 25 - 50% of the time. Needs to repeat parts of sentences. Social  Interaction Social Interaction: 5-Interacts appropriately 90% of the time - Needs monitoring or encouragement for participation or interaction. Problem Solving Problem Solving: 4-Solves basic 75 - 89% of the time/requires cueing 10 - 24% of the time Memory Memory: 4-Recognizes or recalls 75 - 89% of the time/requires cueing 10 - 24% of the time  Pain Pain Assessment Pain Assessment: No/denies pain  Therapy/Group: Individual Therapy  Early Steel 08/23/2012, 11:33 AM

## 2012-08-23 NOTE — Progress Notes (Signed)
Patient ID: [name] [name], female   DOB: 01/12/1951, 62 y.o.   MRN: [id number] Subjective/Complaints: No new issues, denies difficulty in therapies A 12 point review of systems has been performed and if not noted above is otherwise negative.   Objective: Vital Signs: Blood pressure 145/79, pulse 80, temperature 97.9 F (36.6 C), temperature source Oral, resp. rate 18, height 5\' 4"  (1.626 m), weight 50.848 kg (112 lb 1.6 oz), SpO2 100.00%. No results found. No results found for this basename: WBC, HGB, HCT, PLT,  in the last 72 hours No results found for this basename: NA, K, CL, CO, GLUCOSE, BUN, CREATININE, CALCIUM,  in the last 72 hours CBG (last 3)  No results found for this basename: GLUCAP,  in the last 72 hours  Wt Readings from Last 3 Encounters:  08/17/12 50.848 kg (112 lb 1.6 oz)  08/13/12 53.6 kg (118 lb 2.7 oz)  08/13/12 53.6 kg (118 lb 2.7 oz)    Physical Exam:  HENT: dentition poor, oral mucosa pink and moist  Head: Normocephalic.  Eyes: EOM are normal.  Neck: Normal range of motion. Neck supple. No thyromegaly present.  Cardiovascular:  Cardiac rate controlled, no murmurs auscultated  Pulmonary/Chest: Breath sounds normal. No respiratory distress. No wheezes  Abdominal: Soft. Bowel sounds are normal. She exhibits no distension. No pain Neurological: She is alert.  Patient was able to name person, place and date of birth. Limited attention span. Follows three-step commands. ?visual field loss to right. Significant ataxia right arm and leg. Decreased PP and LT in right arm and leg as well.Skin:  Midline chest incision well-healed  3 minus/5 right deltoid, bicep, tricep, grip, 5/5 on the left side 3 minus/5 right hip flexor, knee extensors, ankle dorsiflexor and plantar flexor, 5/5 on the left side  Psychiatric: She has a normal mood and affect.   Assessment/Plan: 1. Functional deficits secondary to left PCA infarct which require 3+ hours per day of interdisciplinary  therapy in a comprehensive inpatient rehab setting. Physiatrist is providing close team supervision and 24 hour management of active medical problems listed below. Physiatrist and rehab team continue to assess barriers to discharge/monitor patient progress toward functional and medical goals. FIM: FIM - Bathing Bathing Steps Patient Completed: Chest;Right Arm;Left Arm;Abdomen;Front perineal area;Buttocks;Right upper leg;Left lower leg (including foot);Right lower leg (including foot);Left upper leg Bathing: 5: Supervision: Safety issues/verbal cues  FIM - Upper Body Dressing/Undressing Upper body dressing/undressing steps patient completed: Thread/unthread right sleeve of pullover shirt/dresss;Thread/unthread left sleeve of pullover shirt/dress;Put head through opening of pull over shirt/dress;Pull shirt over trunk Upper body dressing/undressing: 5: Set-up assist to: Obtain clothing/put away FIM - Lower Body Dressing/Undressing Lower body dressing/undressing steps patient completed: Thread/unthread right pants leg;Thread/unthread left pants leg;Pull pants up/down;Don/Doff right sock;Don/Doff left sock Lower body dressing/undressing: 5: Supervision: Safety issues/verbal cues  FIM - Toileting Toileting steps completed by patient: Adjust clothing prior to toileting;Performs perineal hygiene;Adjust clothing after toileting Toileting Assistive Devices: Grab bar or rail for support Toileting: 5: Supervision: Safety issues/verbal cues  FIM - Diplomatic Services operational officer Devices: Elevated toilet seat;Grab bars Toilet Transfers: 4-To toilet/BSC: Min A (steadying Pt. > 75%);4-From toilet/BSC: Min A (steadying Pt. > 75%)  FIM - Bed/Chair Transfer Bed/Chair Transfer: 5: Supine > Sit: Supervision (verbal cues/safety issues);5: Sit > Supine: Supervision (verbal cues/safety issues);4: Bed > Chair or W/C: Min A (steadying Pt. > 75%);4: Chair or W/C > Bed: Min A (steadying Pt. > 75%)  FIM -  Locomotion: Wheelchair Distance: 100 Locomotion: Wheelchair: 0:  Activity did not occur FIM - Locomotion: Ambulation Locomotion: Ambulation Assistive Devices:  (R HHA) Ambulation/Gait Assistance: 4: Min guard;4: Min assist Locomotion: Ambulation: 4: Travels 150 ft or more with minimal assistance (Pt.>75%)  Comprehension Comprehension Mode: Auditory Comprehension: 5-Understands basic 90% of the time/requires cueing < 10% of the time  Expression Expression Mode: Verbal Expression: 3-Expresses basic 50 - 74% of the time/requires cueing 25 - 50% of the time. Needs to repeat parts of sentences.  Social Interaction Social Interaction: 5-Interacts appropriately 90% of the time - Needs monitoring or encouragement for participation or interaction.  Problem Solving Problem Solving: 4-Solves basic 75 - 89% of the time/requires cueing 10 - 24% of the time  Memory Memory: 4-Recognizes or recalls 75 - 89% of the time/requires cueing 10 - 24% of the time  Medical Problem List and Plan:  1. 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  2. DVT Prophylaxis/Anticoagulation: Subcutaneous heparin. Follow platelets and for any signs of bleeding 3. Pain Management: Tylenol as needed.  4. Neuropsych: This patient is capable of making decisions on her own behalf.  5. 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 Followup cardiology services as needed. No chest pain or shortness of breath.  6. Hypertension. Clonidine 0.2 mg twice a day, lisinopril 20 mg daily---increased to 20mg  bid yesterday, Lopressor 50 mg twice a day. Continue to titrate as needed 7. Hyperlipidemia. Lipitor  8. Tobacco abuse. Counseling  9. Hypokalemia. 4.2 most recently--recheck this week  LOS (Days) 7 A FACE TO FACE EVALUATION WAS PERFORMED  Erick Colace 08/23/2012 9:38 AM

## 2012-08-24 ENCOUNTER — Inpatient Hospital Stay (HOSPITAL_COMMUNITY): Payer: Medicaid Other

## 2012-08-24 ENCOUNTER — Inpatient Hospital Stay (HOSPITAL_COMMUNITY): Payer: Medicaid Other | Admitting: Occupational Therapy

## 2012-08-24 DIAGNOSIS — I6340 Cerebral infarction due to embolism of unspecified cerebral artery: Secondary | ICD-10-CM

## 2012-08-24 DIAGNOSIS — E1165 Type 2 diabetes mellitus with hyperglycemia: Secondary | ICD-10-CM

## 2012-08-24 DIAGNOSIS — I10 Essential (primary) hypertension: Secondary | ICD-10-CM

## 2012-08-24 MED ORDER — CLONIDINE HCL 0.2 MG PO TABS
0.2000 mg | ORAL_TABLET | Freq: Three times a day (TID) | ORAL | Status: DC
  Administered 2012-08-24 – 2012-08-26 (×6): 0.2 mg via ORAL
  Filled 2012-08-24 (×9): qty 1

## 2012-08-24 NOTE — Progress Notes (Signed)
Occupational Therapy Session Note  [name]: [id number] Date of Birth: [birth year]  Today's Date: 08/24/2012 Time: 0800-0900 Time Calculation (min): 60 min  Short Term Goals: Week 1:  OT Short Term Goal 1 (Week 1): Pt will perform shower transfers with supervision. OT Short Term Goal 2 (Week 1): Pt will perform all bathing with supervision from shower level. OT Short Term Goal 3 (Week 1): Pt will perform toilet transfers with supervision. OT Short Term Goal 4 (Week 1): Pt will use the RUE as an active assist for bathing and grooming tasks with supervision. OT Short Term Goal 5 (Week 1): Pt will ambulate around room with supervision and no verbal cueing to locate all bathing and dressing items, including items in the right visual field.    Skilled Therapeutic Interventions/Progress Updates:  Self care retraining to include sponge bath (declined shower today), dress, and groom.  Focused session on visual  scanning due to right inattention, forced use of RUE as patient only initiates use of RUE ~50% of the time.  Patient does not always watch her hand while it is working therefore, she is not as successful due to decreased sensation, proprioception, coordination, and right inattention. Patient able to visually scan and negotiate her environment without bumping into anything this morning while setting up and cleaning up during self care tasks with items purposely placed as obstacles. Reviewed therapy bathroom set up at home and patient demonstrated tub bench transfer with supervision.    Patient's LTGs downgraded to overall supervision due to right visual inattention, decreased sensation and proprioception.  Therapy Documentation Precautions:  Precautions Precautions: Sternal;Fall Precaution Comments:   Restrictions Weight Bearing Restrictions: No Pain: Pain Assessment Pain Assessment: No/denies pain ADL: See FIM for current functional  status  Therapy/Group: Individual Therapy  Salik Grewell 08/24/2012, 9:13 AM

## 2012-08-24 NOTE — Progress Notes (Signed)
Note reviewed and accurately reflects treatment session.   

## 2012-08-24 NOTE — Progress Notes (Signed)
Social Work Patient ID: [name] [name], female   DOB: 12-Nov-1950, 62 y.o.   MRN: [id number] Met with pt and spoke with sister to inform team conference goals-supervision level and discharge 7/26. Have tried to schedule family edcuation for tomorrow unsure if sister will show up had to tell her three times the time to be here. Will also speak with pt's Mom she may have more insight than her daughter.  Pt feels she will be fine moving around at home,  Unaware of her losses of balance.  Will work on discharge for Sat.

## 2012-08-24 NOTE — Progress Notes (Signed)
Patient ID: [name] [name], female   DOB: 02-Jan-1951, 62 y.o.   MRN: [id number] Subjective/Complaints: Increased sentence length. Aphasia more apparent. Still complains of right hand numbness A 12 point review of systems has been performed and if not noted above is otherwise negative.   Objective: Vital Signs: Blood pressure 168/83, pulse 62, temperature 98.9 F (37.2 C), temperature source Oral, resp. rate 16, height 5\' 4"  (1.626 m), weight 49.578 kg (109 lb 4.8 oz), SpO2 97.00%. No results found. No results found for this basename: WBC, HGB, HCT, PLT,  in the last 72 hours No results found for this basename: NA, K, CL, CO, GLUCOSE, BUN, CREATININE, CALCIUM,  in the last 72 hours CBG (last 3)  No results found for this basename: GLUCAP,  in the last 72 hours  Wt Readings from Last 3 Encounters:  08/23/12 49.578 kg (109 lb 4.8 oz)  08/13/12 53.6 kg (118 lb 2.7 oz)  08/13/12 53.6 kg (118 lb 2.7 oz)    Physical Exam:  HENT: dentition poor, oral mucosa pink and moist  Head: Normocephalic.  Eyes: EOM are normal.  Neck: Normal range of motion. Neck supple. No thyromegaly present.  Cardiovascular:  Cardiac rate controlled, no murmurs auscultated  Pulmonary/Chest: Breath sounds normal. No respiratory distress. No wheezes  Abdominal: Soft. Bowel sounds are normal. She exhibits no distension. No pain Neurological: She is alert.  Patient was able to name person, place and date of birth. Limited attention span. Follows three-step commands. ?visual field loss to right. Significant ataxia right arm and leg. Decreased PP and LT in right arm and leg as well.Skin:  Midline chest incision well-healed  3 minus/5 right deltoid, bicep, tricep, grip, 5/5 on the left side 4 minus/5 right hip flexor, knee extensors, ankle dorsiflexor and plantar flexor, 5/5 on the left side  Psychiatric: She has a normal mood and affect.   Assessment/Plan: 1. Functional deficits secondary to left PCA infarct which  require 3+ hours per day of interdisciplinary therapy in a comprehensive inpatient rehab setting. Physiatrist is providing close team supervision and 24 hour management of active medical problems listed below. Physiatrist and rehab team continue to assess barriers to discharge/monitor patient progress toward functional and medical goals. FIM: FIM - Bathing Bathing Steps Patient Completed: Chest;Right Arm;Left Arm;Abdomen;Front perineal area;Buttocks;Right upper leg;Left lower leg (including foot);Right lower leg (including foot);Left upper leg Bathing: 5: Supervision: Safety issues/verbal cues  FIM - Upper Body Dressing/Undressing Upper body dressing/undressing steps patient completed: Thread/unthread right sleeve of pullover shirt/dresss;Thread/unthread left sleeve of pullover shirt/dress;Put head through opening of pull over shirt/dress;Pull shirt over trunk Upper body dressing/undressing: 5: Set-up assist to: Obtain clothing/put away FIM - Lower Body Dressing/Undressing Lower body dressing/undressing steps patient completed: Thread/unthread right pants leg;Thread/unthread left pants leg;Pull pants up/down;Don/Doff right sock;Don/Doff left sock Lower body dressing/undressing: 5: Supervision: Safety issues/verbal cues  FIM - Toileting Toileting steps completed by patient: Adjust clothing prior to toileting;Performs perineal hygiene;Adjust clothing after toileting Toileting Assistive Devices: Grab bar or rail for support Toileting: 5: Supervision: Safety issues/verbal cues  FIM - Diplomatic Services operational officer Devices: Elevated toilet seat Toilet Transfers: 5-To toilet/BSC: Supervision (verbal cues/safety issues);5-From toilet/BSC: Supervision (verbal cues/safety issues)  FIM - Bed/Chair Transfer Bed/Chair Transfer: 5: Supine > Sit: Supervision (verbal cues/safety issues);5: Sit > Supine: Supervision (verbal cues/safety issues);5: Bed > Chair or W/C: Supervision (verbal  cues/safety issues);5: Chair or W/C > Bed: Supervision (verbal cues/safety issues)  FIM - Locomotion: Wheelchair Distance: 100 Locomotion: Wheelchair: 0: Activity did not occur  FIM - Locomotion: Ambulation Locomotion: Ambulation Assistive Devices:  (intermittent loose RUE HHA) Ambulation/Gait Assistance: 4: Min guard;5: Supervision Locomotion: Ambulation: 4: Travels 150 ft or more with minimal assistance (Pt.>75%)  Comprehension Comprehension Mode: Auditory Comprehension: 5-Understands basic 90% of the time/requires cueing < 10% of the time  Expression Expression Mode: Verbal Expression: 3-Expresses basic 50 - 74% of the time/requires cueing 25 - 50% of the time. Needs to repeat parts of sentences.  Social Interaction Social Interaction: 5-Interacts appropriately 90% of the time - Needs monitoring or encouragement for participation or interaction.  Problem Solving Problem Solving: 3-Solves basic 50 - 74% of the time/requires cueing 25 - 49% of the time  Memory Memory: 4-Recognizes or recalls 75 - 89% of the time/requires cueing 10 - 24% of the time  Medical Problem List and Plan:  1. 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  2. DVT Prophylaxis/Anticoagulation: Subcutaneous heparin. Follow platelets and for any signs of bleeding 3. Pain Management: Tylenol as needed.  4. Neuropsych: This patient is capable of making decisions on her own behalf.  5. 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 Followup cardiology services as needed. No chest pain or shortness of breath.  6. Hypertension. Clonidine 0.2 mg twice a day will increase to TID, lisinopril 20 mg daily---increased to 20mg  bid yesterday, Lopressor 50 mg twice a day. Continue to titrate as needed 7. Hyperlipidemia. Lipitor  8. Tobacco abuse. Counseling  9. Hypokalemia. 4.2 most recently--recheck this week  LOS (Days) 8 A FACE TO FACE EVALUATION WAS PERFORMED  KIRSTEINS,ANDREW E 08/24/2012 1:16 PM

## 2012-08-24 NOTE — Progress Notes (Signed)
Speech Language Pathology Daily Session Note  [name]: [id number] Date of Birth: 1950/03/08  Today's Date: 08/24/2012 Time: 0901-0930 Time Calculation (min): 29 min  Short Term Goals: Week 1: SLP Short Term Goal 1 (Week 1): Pt will name familiar items during functional tasks with min phonemic and semantic cues with 90% accuracy.  SLP Short Term Goal 2 (Week 1): Pt will attend to right side of environment/body during functional tasks with Min A verbal and question cues.   SLP Short Term Goal 3 (Week 1): Pt will follow 2 step commands with Min A verbal and question cues.  SLP Short Term Goal 4 (Week 1): Pt will demonstrate functional problem solving for basic and familair tasks with Min A verbal cues.  SLP Short Term Goal 5 (Week 1): Pt will demonstrate reading comprehension at the word level with 50% accuracy and Mod verbal and semantic cues.   Skilled Therapeutic Interventions: Skilled treatment focused on speech goals. SLP facilitated session by providing additional wait time during categorical naming task. Pt was able to independently name 7 items across four categories, requiring Mod-Max phonemic and semantic cues overall. Pt continues to exhibit emergent awareness of speech errors. Continue plan of care.   FIM:  Comprehension Comprehension Mode: Auditory Comprehension: 5-Understands basic 90% of the time/requires cueing < 10% of the time Expression Expression Mode: Verbal Expression: 3-Expresses basic 50 - 74% of the time/requires cueing 25 - 50% of the time. Needs to repeat parts of sentences. Social Interaction Social Interaction: 5-Interacts appropriately 90% of the time - Needs monitoring or encouragement for participation or interaction. Problem Solving Problem Solving: 3-Solves basic 50 - 74% of the time/requires cueing 25 - 49% of the time Memory Memory: 4-Recognizes or recalls 75 - 89% of the time/requires cueing 10 - 24% of the time FIM -  Eating Eating Activity: 7: Complete independence:no helper  Pain Pain Assessment Pain Assessment: No/denies pain  Therapy/Group: Individual Therapy  Maxcine Ham 08/24/2012, 11:28 AM  Maxcine Ham, M.A. CCC-SLP

## 2012-08-24 NOTE — Progress Notes (Signed)
Physical Therapy Session Note  [name]: [id number] Date of Birth: 11/23/1950  Today's Date: 08/24/2012 Time: 1415-1510 Time Calculation (min): 55 min  Short Term Goals: Week 1:   = LTG's  Skilled Therapeutic Interventions/Progress Updates:  Treatment focused on gait and high level balance. Patient ambulated 210 feet with close supervision without assistive device x 2 to and from gym. Patient worked on Editor, commissioning with ball toss, ball toss standing on foam, and kicking ball. Patient required occasional min assist to regain balance especially when standing on foam. Patient ambulated around and over obstacles with supervision. Patient required min assist to step up and over 6 inch step. Patient worked on tapping alternating feet up on step with and without UE support. Patient ambulated up and down 10 steps with close supervision and cueing for sequencing. Patient used Wii just dance for dynamic balance activity with supervision x 4 songs. Patient exercised on Nustep x 3 minutes at work load level 4. Patient left in recliner in room with items in reach.  Therapy Documentation Precautions:  Precautions Precautions: Sternal;Fall Precaution Comments:   Restrictions Weight Bearing Restrictions: No Pain: Pain Assessment Pain Assessment: No/denies pain See FIM for current functional status  Therapy/Group: Individual Therapy  Arelia Longest M 08/24/2012, 3:22 PM

## 2012-08-24 NOTE — Progress Notes (Signed)
Occupational Therapy Session Note  [name]: [id number] Date of Birth: 01/04/1951  Today's Date: 08/24/2012  Short Term Goals: Week 1:  OT Short Term Goal 1 (Week 1): Pt will perform shower transfers with supervision. OT Short Term Goal 2 (Week 1): Pt will perform all bathing with supervision from shower level. OT Short Term Goal 3 (Week 1): Pt will perform toilet transfers with supervision. OT Short Term Goal 4 (Week 1): Pt will use the RUE as an active assist for bathing and grooming tasks with supervision. OT Short Term Goal 5 (Week 1): Pt will ambulate around room with supervision and no verbal cueing to locate all bathing and dressing items, including items in the right visual field.    Skilled Therapeutic Interventions/Progress Updates:  Session Note: Time: 1115-1155 (40 mins) Pt with no report of pain.  Upon entering room, pt seated in recliner. Pt ambulated->w/c. OT pushed pt->ADL apt. From here skilled intervention focused on simple meal prep activity, dynamic standing balance/endurance, fxal mobility, visual scanning to R side, problem solving, memory, incorporating RUE into tasks, safety awareness, and overall activity tolerance. Pt required max v.c's for completion of simple meal prep activity secondary to decreased safety awareness. OT pushed pt->room. Pt ambulated->recliner. At end of tx session, pt seated in recliner with tray in front eating lunch. Call bell within reach.  Therapy Documentation Precautions:  Precautions Precautions: Sternal;Fall Precaution Comments:   Restrictions Weight Bearing Restrictions: No  See FIM for current functional status  Therapy/Group: Individual Therapy  Lilyrose Tanney 08/24/2012, 8:00 AM

## 2012-08-25 ENCOUNTER — Inpatient Hospital Stay (HOSPITAL_COMMUNITY): Payer: Medicaid Other | Admitting: Occupational Therapy

## 2012-08-25 ENCOUNTER — Inpatient Hospital Stay (HOSPITAL_COMMUNITY): Payer: Medicaid Other | Admitting: Physical Therapy

## 2012-08-25 ENCOUNTER — Inpatient Hospital Stay (HOSPITAL_COMMUNITY): Payer: Medicaid Other | Admitting: Speech Pathology

## 2012-08-25 MED ORDER — ATORVASTATIN CALCIUM 80 MG PO TABS: 80.0000 mg | ORAL_TABLET | Freq: Every day | ORAL | Status: DC

## 2012-08-25 MED ORDER — ASPIRIN EC 325 MG PO TBEC: 325.0000 mg | DELAYED_RELEASE_TABLET | Freq: Every day | ORAL | Status: DC

## 2012-08-25 MED ORDER — CLONIDINE HCL 0.2 MG PO TABS: 0.2000 mg | ORAL_TABLET | Freq: Two times a day (BID) | ORAL | Status: DC

## 2012-08-25 MED ORDER — LISINOPRIL 20 MG PO TABS: 20.0000 mg | ORAL_TABLET | Freq: Two times a day (BID) | ORAL | Status: DC

## 2012-08-25 MED ORDER — METOPROLOL TARTRATE 50 MG PO TABS: 50.0000 mg | ORAL_TABLET | Freq: Two times a day (BID) | ORAL | Status: DC

## 2012-08-25 NOTE — Progress Notes (Signed)
Social Work Discharge Note Discharge Note  The overall goal for the admission was met for:   Discharge location: Yes-HOME WITH MOM AND HANDICAPPED DAUGHTER  Length of Stay: Yes-9 DAYS  Discharge activity level: Yes-SUPERVISION LEVEL  Home/community participation: Yes  Services provided included: MD, RD, PT, OT, SLP, RN, TR, Pharmacy and SW  Financial Services: Other: PENDING MEDICAID  Follow-up services arranged: Home Health: [facility]-PT,OT,SPT,RN,SW and Patient/Family has no preference for HH/DME agencies  Comments (or additional information):FAMILY EDUCATION COMPLETED AND MOM COMFORTABLE WITH PT'S CARE AT HOME  Patient/Family verbalized understanding of follow-up arrangements: Yes  Individual responsible for coordination of the follow-up plan: LILLIE-MOM & ANNETTE-DAUGHTER  Confirmed correct DME delivered: Lucy Chris 08/25/2012    Lucy Chris

## 2012-08-25 NOTE — Progress Notes (Signed)
Physical Therapy Discharge Summary  [name]: [id number] Date of Birth: 1950/03/05  Today's Date: 08/25/2012 Time: 1400-1445 Time Calculation (min): 45 min  Patient has met 8 of 10 long term goals due to improved activity tolerance, improved balance, increased strength and improved coordination.  Patient to discharge at an ambulatory level Supervision.   Patient's care partner is independent to provide the necessary physical assistance at discharge.  Reasons goals not met: Pt partially met LTGs 9 and 10. Pt requires use of hand rail to ambulate up and down > than 1 step. Pt requires supervision assist with ability for day to day recall/carryover during mobility with activity and is a slight fall risk.  Recommendation:  Patient will benefit from ongoing skilled PT services in home health setting to continue to advance safe functional mobility, address ongoing impairments in memory, apraxia, balance, strength, and minimize fall risk.  Equipment: No equipment provided  Reasons for discharge: treatment goals met and discharge from hospital  Patient/family agrees with progress made and goals achieved: Yes  PT Discharge Precautions/Restrictions   Pain Pt reports pain in R hand 3-4/10     Cognition Overall Cognitive Status: Impaired/Different from baseline Arousal/Alertness: Awake/alert Orientation Level: Oriented X4 Attention: Selective Focused Attention: Appears intact Sustained Attention: Appears intact Selective Attention: Appears intact Memory:  (difficult to assess due to aphasia ) Awareness: Impaired Awareness Impairment: Anticipatory impairment Problem Solving: Impaired Problem Solving Impairment: Functional basic Behaviors: Impulsive Safety/Judgment: Impaired  Bed Mobility Bed Mobility: Supine to Sit Supine to Sit: 6: Modified independent (Device/Increase time) Sitting - Scoot to Edge of Bed: 6: Modified independent (Device/Increase  time) Transfers Sit to Stand: 5: Supervision Stand to Sit: 5: Supervision Locomotion  Ambulation Ambulation: Yes Ambulation/Gait Assistance: 5: Supervision Ambulation Distance (Feet): 150 Feet Assistive device: None Ambulation/Gait Assistance Details: Visual cues/gestures for precautions/safety Gait Gait: Yes Gait Pattern: Within Functional Limits Gait Pattern: Decreased stride length;Decreased stance time - right Stairs / Additional Locomotion Stairs: Yes Stairs Assistance: 4: Min guard Stairs Assistance Details: Visual cues/gestures for precautions/safety Stair Management Technique: One rail Left Number of Stairs: 24 Height of Stairs: 6 Ramp: 5: Supervision Curb: 5: Supervision  Trunk/Postural Assessment     Balance Sitting balance Independent, static standing balance SBA, dynamic standing balance SBA.  Static Sitting Balance Static Sitting - Balance Support: No upper extremity supported;Left upper extremity supported;Right upper extremity supported Static Sitting - Level of Assistance: 5: Stand by assistance Static Standing Balance Static Standing - Level of Assistance: 6: Modified independent (Device/Increase time)  See FIM for current functional status  Jackelyn Knife 08/25/2012, 3:53 PM

## 2012-08-25 NOTE — Discharge Summary (Signed)
  Discharge summary job [id number]

## 2012-08-25 NOTE — Discharge Summary (Signed)
NAMENATANIA, FINIGAN               ACCOUNT NO.:  [id number]  MEDICAL RECORD NO.:  [id number]  LOCATION:  [id number]                        FACILITY:  [facility]  PHYSICIAN:  Erick Colace, M.D.DATE OF BIRTH:  26-Sep-1950  DATE OF ADMISSION:  08/16/2012 DATE OF DISCHARGE:  08/26/2012                              DISCHARGE SUMMARY   DISCHARGE DIAGNOSES: 1. Left PCA territory infarct, felt to be embolic. 2. Subcutaneous heparin for deep vein thrombosis prophylaxis. 3. Pain management. 4. Coronary artery disease with coronary artery bypass grafting July 31, 2012. 5. Hypertension. 6. Hyperlipidemia. 7. Tobacco abuse. 8. Hypokalemia resolved.  HISTORY OF PRESENT ILLNESS:  This is a 62 year old right-handed female with history of hypertension, tobacco abuse, coronary artery disease with coronary artery bypass grafting and recent discharge after bypass grafting July 31, 2012 who was admitted August 13, 2012, with right-sided weakness and aphasia.  MRI of the brain showed acute nonhemorrhagic left PCA territory infarction.  MRA of the head with severe distal left PCA and basilar artery grossly patent.  Echocardiogram with ejection fraction 60%, grade 2 diastolic dysfunction.  Carotid Dopplers with less than 39% ICA stenosis.  TEE showed ejection fraction 60% without thrombus.  Followup Cardiology Service for monitoring of atrial fibrillation in relation to CVA with telemetry demonstrating sinus rhythm and no atrial arrhythmias.  The patient underwent implantable loop recorder per Cardiology Services on August 15, 2012, for monitoring. Neurology Service was consulted, maintained on aspirin therapy as well as subcutaneous heparin for DVT prophylaxis.  The patient did not receive tPA.  Noted bouts of hypokalemia with supplement added. Physical and occupational therapy ongoing.  The patient was admitted for comprehensive rehab program.  PAST MEDICAL HISTORY:  See discharge diagnoses.  SOCIAL  HISTORY:  Lives with parents.  FUNCTIONAL HISTORY PRIOR TO ADMISSION:  Independent was living alone prior to CABG.  FUNCTIONAL STATUS UPON ADMISSION TO REHAB SERVICES:  Minimal assist to ambulate 15 feet.  PHYSICAL EXAMINATION:  VITAL SIGNS:  Blood pressure 182/74, pulse 62, temperature 97, respirations 18. GENERAL:  This was an alert female, she was able to name person, place, and date of birth.  She was somewhat impulsive with limited attention span and followed 3 step commands.  Questionable visual field loss on the right. EYES:  Pupils reactive to light. LUNGS:  Clear to auscultation. CARDIAC:  Regular rate and rhythm. ABDOMEN:  Soft, nontender.  Good bowel sounds.  Midline chest incision well healed.  REHABILITATION HOSPITAL COURSE:  The patient was admitted to inpatient rehab services with therapies initiated on a 3-hour daily basis consisting of physical therapy, occupational therapy, speech therapy, and rehabilitation nursing.  The following issues were addressed during the patient's rehabilitation stay.  Pertaining to Ms. Daleo left PCA territory infarction felt to be embolic she remained on aspirin therapy and would follow up Neurology Services.  Subcutaneous heparin ongoing for DVT prophylaxis with no bleeding episodes.  She was using Tylenol as needed for pain.  She had undergone a recent coronary artery bypass grafting July 31, 2012, with no chest pain or shortness of breath.  An implantable loop recorder had been completed per Cardiology Services and she would follow up  as an outpatient.  Blood pressures controlled with clonidine, lisinopril and Lopressor with no orthostatic changes and monitored.  She did have a history of tobacco abuse and received full counseling in regard to cessation of nicotine products.  It was questionable if she would be compliant with these request.  She did have bouts of hypokalemia resolved with latest chemistry showing a potassium of  4.2.  The patient received weekly collaborative interdisciplinary team conferences to discuss estimated length of stay, family teaching, and any barriers to discharge.  She was continent of bowel and bladder. Functionally, she was ambulating 210 feet with close supervision without an assistive device x2 with increased endurance.  Working with dynamic balance.  She required minimal assist to regain balance especially when standing on foam.  She was able to ambulate around and over obstacles with supervision.  She required minimal assist to step up over a 6 inch step.  Activities of daily living focused session on visual scanning due to some right in attention.  She was able to gather her belongings for dressing and bathing as well as grooming.  She was 90% accurate on comprehension 50-74% for expression although she was able to express her needs.  Full family teaching was completed and plans to be discharged to home.  DISCHARGE MEDICATIONS: 1. Aspirin 325 mg p.o. daily. 2. Lipitor 80 mg p.o. daily. 3. Clonidine 0.2 mg p.o. t.i.d. 4. Lisinopril 20 mg p.o. b.i.d. 5. Lopressor 50 mg p.o. b.i.d.  DIET:  Regular.  SPECIAL INSTRUCTIONS:  The patient would follow up Dr. Claudette Laws at the outpatient rehab center September 25, 2012, at 10:15 a.m., Dr. Hillis Range Cardiology Services in regards to implantable loop recorder call for appointment in 2 weeks, Dr. Delia Heady 1 month followup appointment, care manager working with issues for set up of medical followup with PCP.  Ongoing therapies were dictated as per [employer].     Mariam Dollar, P.A.   ______________________________ Erick Colace, M.D.   DA/MEDQ  D:  08/25/2012  T:  08/25/2012  Job:  [id number]  cc:   Hillis Range, MD Pramod P. Pearlean Brownie, MD

## 2012-08-25 NOTE — Progress Notes (Signed)
Speech Language Pathology Session Note & Discharge Summary  [name]: [id number] Date of Birth: 1950/08/24  Today's Date: 08/25/2012 Time: 1300-1330 Time Calculation (min): 30 min  Skilled Therapeutic Intervention: Treatment focus on family education in regards to current functional communication and cognitive function. Pt provided education on strategies to utilize to increased verbal expression, auditory comprehension, functional problem solving and overall safety.  Pt and family verbalize understanding of all information. Pt's family reported no questions at this time.   Patient has met 5 of 5 long term goals.  Patient to discharge at overall Min;Supervision level.   Reasons goals not met: N/A   Clinical Impression/Discharge Summary: Pt has made functional gains and has met all LTG's this admission due to improvements in auditory comprehension of basic information and functional communication for expression of wants/needs. Pt has also made improvements in the areas of functional problem solving, selective attention, functional problem solving, attention to right field of environment and emergent awareness and requires supervision cues for safety. Pt/family education complete and pt will discharge home with 24 hour supervision from family. Pt would benefit from f/u home health SLP services to maximize cognitive function and functional communication and to maximize overall functional independence.   Care Partner:  Caregiver Able to Provide Assistance: Yes  Type of Caregiver Assistance: Physical;Cognitive  Recommendation:  24 hour supervision/assistance;Home Health SLP  Rationale for SLP Follow Up: Maximize cognitive function and independence;Maximize functional communication;Reduce caregiver burden   Equipment: N/A  Reasons for discharge: Discharged from hospital;Treatment goals met   Patient/Family Agrees with Progress Made and Goals Achieved: Yes   See  FIM for current functional status  [name] [name] 08/25/2012, 3:43 PM

## 2012-08-25 NOTE — Progress Notes (Signed)
Social Work Patient ID: [name] [name], female   DOB: 07/13/1950, 62 y.o.   MRN: [id number] Mom and sister here to go through family education, completed according to Christina-OT.  Family aware of the supervision Needs pt requires.  Have informed home health follow up and all agreeable .  Pt has no DME needs, so ready for discharge tomorrow.

## 2012-08-25 NOTE — Progress Notes (Signed)
Occupational Therapy Discharge Summary And Treatment Session Notes  [name]: [id number] Date of Birth: 1950-12-11  Today's Date: 08/25/2012 Time: 0900-1000 and 130-200 and 310-325 Time Calculation (min): 60 min and 30 min and 15 min  Patient has met 11 of 11 long term goals due to improved activity tolerance, improved balance, postural control, ability to compensate for deficits, functional use of  RIGHT upper and RIGHT lower extremity, improved attention, improved awareness and improved coordination.  Patient to discharge at overall Supervision level.  Patient's care partner is independent to provide the necessary physical, cognitive and vcs for absent right sided sensation and right field cut assistance at discharge.    Reasons goals not met: n/a secondary to all goals met  Recommendation:  Patient will benefit from ongoing skilled OT services in home health setting to continue to advance functional skills in the area of BADL, iADL and Reduce care partner burden.  Equipment: No equipment provided  Reasons for discharge: treatment goals met and discharge from hospital  Patient/family agrees with progress made and goals achieved: Yes  OT Discharge Precautions/Restrictions  Precautions Precautions: Sternal;Fall Precaution Comments: right field cut, absent sensation on left, aphasia Pain Denies pain ADL Overall Supervision, see FIM for details Vision/Perception  Vision - History Baseline Vision: No visual deficits Patient Visual Report: Peripheral vision impairment Vision - Assessment Eye Alignment: Within Functional Limits Vision Assessment: Vision tested Ocular Range of Motion: Within Functional Limits Tracking/Visual Pursuits: Able to track stimulus in all quads without difficulty Convergence: Within functional limits Visual Fields: Right homonymous hemianopsia Perception Perception: Impaired Inattention/Neglect: Does not attend to right  visual field;Does not attend to right side of body Praxis Praxis: Impaired Praxis Impairment Details: Motor planning  Cognition Overall Cognitive Status: Impaired/Different from baseline Arousal/Alertness: Awake/alert Orientation Level: Oriented X4 Attention: Selective Focused Attention: Appears intact Sustained Attention: Appears intact Selective Attention: Appears intact Memory:  (difficult to assess due to aphasia ) Awareness: Impaired Awareness Impairment: Anticipatory impairment Problem Solving: Impaired Problem Solving Impairment: Functional basic Behaviors: Impulsive Safety/Judgment: Impaired Sensation Sensation Light Touch: Impaired Detail Light Touch Impaired Details: Absent RUE (except 1/20 attempts, pt able to feel top of forearm) Stereognosis: Impaired Detail Stereognosis Impaired Details: Absent RUE Hot/Cold: Impaired Detail Hot/Cold Impaired Details: Absent RUE Proprioception: Impaired Detail Proprioception Impaired Details: Absent RUE Coordination Gross Motor Movements are Fluid and Coordinated: No Fine Motor Movements are Fluid and Coordinated: No Coordination and Movement Description: Pt with dysmetria with finger to nose testing with the RUE.  Also demonstrates decreased motor planning and coordination to perform reapid alternating movements and  FM tasks or to oppose the thumb to the pad of any other finger. Motor  Motor Motor: Motor apraxia;Hemiplegia Mobility  Bed Mobility Bed Mobility: Supine to Sit Supine to Sit: 6: Modified independent (Device/Increase time) Sitting - Scoot to Edge of Bed: 6: Modified independent (Device/Increase time) Transfers Sit to Stand: 5: Supervision Stand to Sit: 5: Supervision  Balance Static Sitting Balance Static Sitting - Balance Support: No upper extremity supported;Left upper extremity supported;Right upper extremity supported Static Sitting - Level of Assistance: 5: Stand by assistance Static Standing  Balance Static Standing - Level of Assistance: 6: Modified independent (Device/Increase time) Extremity/Trunk Assessment RUE Assessment RUE Assessment: Exceptions to [facility] RUE Strength RUE Overall Strength Comments: Pt with full AROM.  Strength in these areas at least 4/5 ad demonstrated during BADL & IADL.  Full grasp and release also present with grip strength 4+/5.  Pt with decreased coordination  with opposition of thumb to digits.  Patient only able to open small containers with VCs to watch her hand while it works and to grade her movement to be softer and more gentle.  Pt with fewer episodes of dropping items when they ar in her right hand. LUE Assessment LUE Assessment: Within Functional Limits  See FIM for current functional status  [name] [name] 08/25/2012, 4:56 PM

## 2012-08-25 NOTE — Progress Notes (Signed)
Patient ID: [name] [name], female   DOB: 1950-12-24, 62 y.o.   MRN: [id number] Subjective/Complaints: Increased sentence length. Looking forward to [facility] in am A 12 point review of systems has been performed and if not noted above is otherwise negative.   Objective: Vital Signs: Blood pressure 169/98, pulse 62, temperature 97.4 F (36.3 C), temperature source Oral, resp. rate 16, height 5\' 4"  (1.626 m), weight 49.578 kg (109 lb 4.8 oz), SpO2 100.00%. No results found. No results found for this basename: WBC, HGB, HCT, PLT,  in the last 72 hours No results found for this basename: NA, K, CL, CO, GLUCOSE, BUN, CREATININE, CALCIUM,  in the last 72 hours CBG (last 3)  No results found for this basename: GLUCAP,  in the last 72 hours  Wt Readings from Last 3 Encounters:  08/23/12 49.578 kg (109 lb 4.8 oz)  08/13/12 53.6 kg (118 lb 2.7 oz)  08/13/12 53.6 kg (118 lb 2.7 oz)    Physical Exam:  HENT: dentition poor, oral mucosa pink and moist  Head: Normocephalic.  Eyes: EOM are normal.  Neck: Normal range of motion. Neck supple. No thyromegaly present.  Cardiovascular:  Cardiac rate controlled, no murmurs auscultated  Pulmonary/Chest: Breath sounds normal. No respiratory distress. No wheezes  Abdominal: Soft. Bowel sounds are normal. She exhibits no distension. No pain Neurological: She is alert.  Patient was able to name person, place and date of birth. Limited attention span. Follows three-step commands. ?visual field loss to right. Significant ataxia right arm and leg. Decreased PP and LT in right arm and leg as well.Skin:  Midline chest incision well-healed  3 minus/5 right deltoid, bicep, tricep, grip, 5/5 on the left side 4 minus/5 right hip flexor, knee extensors, ankle dorsiflexor and plantar flexor, 5/5 on the left side  Psychiatric: She has a normal mood and affect.   Assessment/Plan: 1. Functional deficits secondary to left PCA infarct which require 3+ hours per day of  interdisciplinary therapy in a comprehensive inpatient rehab setting.  Stable for D/C in am FIM: FIM - Bathing Bathing Steps Patient Completed: Chest;Right Arm;Left Arm;Abdomen;Front perineal area;Buttocks;Right upper leg;Left lower leg (including foot);Right lower leg (including foot);Left upper leg Bathing: 5: Supervision: Safety issues/verbal cues  FIM - Upper Body Dressing/Undressing Upper body dressing/undressing steps patient completed: Thread/unthread right sleeve of pullover shirt/dresss;Thread/unthread left sleeve of pullover shirt/dress;Put head through opening of pull over shirt/dress;Pull shirt over trunk Upper body dressing/undressing: 5: Supervision: Safety issues/verbal cues FIM - Lower Body Dressing/Undressing Lower body dressing/undressing steps patient completed: Thread/unthread right pants leg;Thread/unthread left pants leg;Pull pants up/down;Don/Doff right sock;Don/Doff left sock (no shoes were ever brought in!) Lower body dressing/undressing: 5: Supervision: Safety issues/verbal cues  FIM - Toileting Toileting steps completed by patient: Adjust clothing prior to toileting;Performs perineal hygiene;Adjust clothing after toileting Toileting Assistive Devices: Grab bar or rail for support Toileting: 5: Supervision: Safety issues/verbal cues  FIM - Diplomatic Services operational officer Devices: Elevated toilet seat;Grab bars Toilet Transfers: 5-To toilet/BSC: Supervision (verbal cues/safety issues);5-From toilet/BSC: Supervision (verbal cues/safety issues)  FIM - Press photographer Assistive Devices: Arm rests Bed/Chair Transfer: 5: Supine > Sit: Supervision (verbal cues/safety issues);5: Sit > Supine: Supervision (verbal cues/safety issues);5: Chair or W/C > Bed: Supervision (verbal cues/safety issues);5: Bed > Chair or W/C: Supervision (verbal cues/safety issues)  FIM - Locomotion: Wheelchair Distance: 100 Locomotion: Wheelchair: 0: Activity  did not occur FIM - Locomotion: Ambulation Locomotion: Ambulation Assistive Devices:  (intermittent loose RUE HHA) Ambulation/Gait Assistance: 5: Supervision Locomotion: Ambulation:  5: Travels 150 ft or more with supervision/safety issues  Comprehension Comprehension Mode: Auditory Comprehension: 5-Understands basic 90% of the time/requires cueing < 10% of the time  Expression Expression Mode: Verbal Expression: 4-Expresses basic 75 - 89% of the time/requires cueing 10 - 24% of the time. Needs helper to occlude trach/needs to repeat words.  Social Interaction Social Interaction: 5-Interacts appropriately 90% of the time - Needs monitoring or encouragement for participation or interaction.  Problem Solving Problem Solving: 5-Solves basic 90% of the time/requires cueing < 10% of the time  Memory Memory: 5-Recognizes or recalls 90% of the time/requires cueing < 10% of the time  Medical Problem List and Plan:  1. 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  2. DVT Prophylaxis/Anticoagulation: Subcutaneous heparin. Follow platelets and for any signs of bleeding 3. Pain Management: Tylenol as needed.  4. Neuropsych: This patient is capable of making decisions on her own behalf.  5. 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 Followup cardiology services as needed. No chest pain or shortness of breath.  6. Hypertension. Clonidine 0.2 mg twice a day will increase to TID, lisinopril 20 mg daily---increased to 20mg  bid earlier in week, Lopressor 50 mg twice a day. Continue to titrate as needed 7. Hyperlipidemia. Lipitor  8. Tobacco abuse. Counseling  9. Hypokalemia. 4.2 most recently--recheck this week  LOS (Days) 9 A FACE TO FACE EVALUATION WAS PERFORMED  Erick Colace 08/25/2012 5:36 PM

## 2012-08-28 NOTE — Plan of Care (Signed)
Problem: RH Stairs Goal: LTG Patient will ambulate up and down stairs w/assist (PT) LTG: Patient will ambulate up and down # of stairs with assistance (PT)  Outcome: Not Met (add Reason) Requires use of handrail  Problem: RH Memory Goal: LTG Patient demonstrate ability for day to day recall (PT) LTG: Patient will demonstrate ability for day to day recall/carryover during mobility activities with assist (PT)  Outcome: Not Met (add Reason) Requires supervision for carryover/recall at time of D/C.

## 2012-08-28 NOTE — Discharge Summary (Signed)
Physician Discharge Summary  [name] [name] UJW:[id number] DOB: 1950/03/01 DOA: 08/13/2012  PCP: No primary provider on file.  Admit date: 08/13/2012 Discharge date: 08/15/2012  Time spent: 45 minutes  Recommendations for Outpatient Follow-up:  1. PCP in 1 week 2. Dr.Allred in 2 weeks 3. Dr.Berry 9/24  Discharge Diagnoses:  Principal Problem:   Acute CVA (cerebrovascular accident) Active Problems:   Essential hypertension   Tobacco abuse   S/P CABG x 5 : LIMA-LAD, SVG-OM1-OM2, SVG-(Y)-DIAG-PDA. 07/25/12   Discharge Condition: stable  Diet recommendation: heart healthy  Filed Weights   08/13/12 2100  Weight: 53.6 kg (118 lb 2.7 oz)    History of present illness:  Chief Complaint: Right-sided weakness  HPI: [name] [name] is a 62 y.o. female with past medical history of hypertension and recent STEMI status post CABG with x5 bypass, she was discharged from the hospital on 07/31/2012 after she had her CABG. Patient was recovering at her mother's house. Per her and her family about 4:30 this morning patient fell, then she did have slurred speech and right-sided weakness. Patient did not want to come to the emergency department but finally family was able to bring her. Initial evaluation in the emergency department with CT scan showed probable stroke in the left PCA territory. Seen by neurology already, tPA was not given because of she is out of its window.   Hospital Course:  Acute nonhemorrhagic stroke  -Left posterior cerebral artery distribution  -MRI brain shows severe distal left PCA disease  -Neurology following  -Continue aspirin  -Hemoglobin A1c 5.4  -LDL 54, TSH 0.570  -Discussed with stroke team today (08/14/12)--due to concerns for embolic source--TEE has been ordered  -Stroke team has arranged for TEE 08/15/12  -08/15/2012 TEE--EF of 60%, mild LVH, no LAA thrombus  -implantable loop recorder (Reveal device) placed 08/15/12 due to concerns of undx Afib  -continue PT/OT   -CIR consulted 08/14/12-->hopeful for transfer once stable, cleared medically  Encephalopathy  -slow improvement each day, but remains confused  -Check UA and urine culture--has not yet to be collected  -Serum B12--459  -RBC folate pending  -ammonia--26  -uds  -troponin neg x 2  Hypertension  -Continue lisinopril, clonidine and metoprolol tartrate  -Increase lisinopril to 20 mg daily  -Borderline slow heart rate has limited increase of metoprolol tartrate and clonidine  Hyperlipidemia  -Continue Lipitor  Tobacco abuse  -Tobacco cessation discussed  Coronary artery disease  -Status post CABG x5 vessels 07/25/2012--Dr. Dorris Fetch  -no post-CABG Afib from review of records      Procedures: TEE Loop recorder 7/15  Consultations:  Neurology  Cardiology, EP  Discharge Exam: Filed Vitals:   08/16/12 0130 08/16/12 0556 08/16/12 0942 08/16/12 1400  BP: 156/86 151/78 174/77 151/74  Pulse: 69 61 65 68  Temp: 97.4 F (36.3 C) 97.6 F (36.4 C) 98.6 F (37 C) 97.6 F (36.4 C)  TempSrc: Oral Oral Oral Oral  Resp: 18 18 19 18   Height:      Weight:      SpO2: 96% 98% 100% 98%    General: AAOx3 Cardiovascular: S!S2/RRR Respiratory: CTAB  Discharge Instructions   Future Appointments Provider Department Dept Phone   09/25/2012 10:30 AM [phone]   10/25/2012 11:00 AM [name], MD [facility] [place] [phone]       Medication List    STOP taking these medications       aspirin EC 325 MG tablet  atorvastatin 80 MG tablet  Commonly known as:  LIPITOR     cloNIDine 0.2 MG tablet  Commonly known as:  CATAPRES     lisinopril 10 MG tablet  Commonly known as:  PRINIVIL,ZESTRIL     metoprolol 50 MG tablet  Commonly known as:  LOPRESSOR     traMADol 50 MG tablet  Commonly known as:  ULTRAM       No Known Allergies    The results of significant diagnostics  from this hospitalization (including imaging, microbiology, ancillary and laboratory) are listed below for reference.    Significant Diagnostic Studies: Dg Chest 2 View  08/14/2012   *RADIOLOGY REPORT*  Clinical Data: Right arm weakness.  Stroke.  CHEST - 2 VIEW  Comparison: 07/28/2012 and 07/24/2012 radiographs.  Findings: The heart size is stable status post CABG.  The pleural effusions and bibasilar atelectasis have improved.  There is no edema or pneumothorax.  The osseous structures appear unchanged.  IMPRESSION: Improving basilar aeration and pleural effusion status post CABG.   Original Report Authenticated By: Carey Bullocks, M.D.   Ct Head (brain) Wo Contrast  08/13/2012   *RADIOLOGY REPORT*  Clinical Data: Altered mental status.  CT HEAD WITHOUT CONTRAST  Technique:  Contiguous axial images were obtained from the base of the skull through the vertex without contrast.  Comparison: None.  Findings: There is decreased attenuation in the left PCA territory with appearance most consistent with acute/subacute infarct. Remote left parietal infarct is also identified.  No hemorrhage, midline shift or hydrocephalus is seen. There is no pneumocephalus. Imaged paranasal sinuses and mastoid air cells are clear.  IMPRESSION:  1.  Findings compatible with acute/subacute infarct in the left PCA territory. 2.  Remote left parietal infarct. 3.  Critical Value/emergent results were called by telephone at the time of interpretation on 08/13/2012 at 1534 hours to Dr. Devoria Albe, who verbally acknowledged these results.   Original Report Authenticated By: Holley Dexter, M.D.   Mr Brain Wo Contrast  08/14/2012   *RADIOLOGY REPORT*  Clinical Data:  Right-sided weakness.  Stroke risk factors include hypertension, hyperlipidemia, prior myocardial infarction, and tobacco abuse.  MRI HEAD WITHOUT CONTRAST MRA HEAD WITHOUT CONTRAST  Technique:  Multiplanar, multiecho pulse sequences of the brain and surrounding structures  were obtained without intravenous contrast. Angiographic images of the head were obtained using MRA technique without contrast.  Comparison:  CT head 08/13/2012.  MRI HEAD  Findings:  Large acute left hemisphere infarct affects predominately  PCA territory including the posterior temporal lobe, much of the occipital lobe, portions of the parietal lobe, and much of the thalamus and posterior limb internal capsule, all on the left. There is no acute or subacute hemorrhage.  There is no midline shift.  There is a remote left parietal cortical and subcortical infarct with associated gliosis and encephalomalacia.  This is associated with slight chronic gyral microhemorrhage.  This infarct lies within the left MCA distribution.  There is mild atrophy.  There is mild chronic microvascular ischemic change.  Normal pituitary.    Negative osseous structures. Extracranial soft tissues unremarkable.  IMPRESSION: Acute non hemorrhagic left PCA territory infarct affecting most of that vessel's vascular territory. Chronic left parietal MCA territory infarct.  MRA HEAD  Findings: Significant motion degradation.  Gross patency of the carotid arteries is established.  The basilar artery is patent with left vertebral dominant. The left vertebral is occluded except for its distal most segment.  This could fill via retrograde flow from the  basilar.  There is no appreciable flow related enhancement of the right vertebral at the skull base.  There is no proximal flow limiting stenosis of the middle cerebral arteries.  Focal narrowing at the origin of the right anterior cerebral artery could be 75%. Severe disease bilateral PCA vessels, greater on the left. Moderate irregularity distal MCA vessels bilaterally.  IMPRESSION: Motion degraded scan.  Severe disease distal left PCA.  Basilar artery grossly patent. Abnormal distal right vertebral.   Original Report Authenticated By: Davonna Belling, M.D.   Mr Mra Head/brain Wo Cm  08/14/2012    *RADIOLOGY REPORT*  Clinical Data:  Right-sided weakness.  Stroke risk factors include hypertension, hyperlipidemia, prior myocardial infarction, and tobacco abuse.  MRI HEAD WITHOUT CONTRAST MRA HEAD WITHOUT CONTRAST  Technique:  Multiplanar, multiecho pulse sequences of the brain and surrounding structures were obtained without intravenous contrast. Angiographic images of the head were obtained using MRA technique without contrast.  Comparison:  CT head 08/13/2012.  MRI HEAD  Findings:  Large acute left hemisphere infarct affects predominately  PCA territory including the posterior temporal lobe, much of the occipital lobe, portions of the parietal lobe, and much of the thalamus and posterior limb internal capsule, all on the left. There is no acute or subacute hemorrhage.  There is no midline shift.  There is a remote left parietal cortical and subcortical infarct with associated gliosis and encephalomalacia.  This is associated with slight chronic gyral microhemorrhage.  This infarct lies within the left MCA distribution.  There is mild atrophy.  There is mild chronic microvascular ischemic change.  Normal pituitary.    Negative osseous structures. Extracranial soft tissues unremarkable.  IMPRESSION: Acute non hemorrhagic left PCA territory infarct affecting most of that vessel's vascular territory. Chronic left parietal MCA territory infarct.  MRA HEAD  Findings: Significant motion degradation.  Gross patency of the carotid arteries is established.  The basilar artery is patent with left vertebral dominant. The left vertebral is occluded except for its distal most segment.  This could fill via retrograde flow from the basilar.  There is no appreciable flow related enhancement of the right vertebral at the skull base.  There is no proximal flow limiting stenosis of the middle cerebral arteries.  Focal narrowing at the origin of the right anterior cerebral artery could be 75%. Severe disease bilateral PCA  vessels, greater on the left. Moderate irregularity distal MCA vessels bilaterally.  IMPRESSION: Motion degraded scan.  Severe disease distal left PCA.  Basilar artery grossly patent. Abnormal distal right vertebral.   Original Report Authenticated By: Davonna Belling, M.D.    Microbiology: No results found for this or any previous visit (from the past 240 hour(s)).   Labs: Basic Metabolic Panel: No results found for this basename: NA, K, CL, CO2, GLUCOSE, BUN, CREATININE, CALCIUM, MG, PHOS,  in the last 168 hours Liver Function Tests: No results found for this basename: AST, ALT, ALKPHOS, BILITOT, PROT, ALBUMIN,  in the last 168 hours No results found for this basename: LIPASE, AMYLASE,  in the last 168 hours No results found for this basename: AMMONIA,  in the last 168 hours CBC: No results found for this basename: WBC, NEUTROABS, HGB, HCT, MCV, PLT,  in the last 168 hours Cardiac Enzymes: No results found for this basename: CKTOTAL, CKMB, CKMBINDEX, TROPONINI,  in the last 168 hours BNP: BNP (last 3 results)  Recent Labs  07/21/12 2337  PROBNP 1358.0*   CBG: No results found for this basename: GLUCAP,  in the last 168 hours     Signed:  Abhiraj Dozal  [facility] 08/28/2012, 9:50 AM

## 2012-08-31 ENCOUNTER — Ambulatory Visit (INDEPENDENT_AMBULATORY_CARE_PROVIDER_SITE_OTHER): Payer: Medicaid Other | Admitting: *Deleted

## 2012-08-31 DIAGNOSIS — I639 Cerebral infarction, unspecified: Secondary | ICD-10-CM

## 2012-08-31 DIAGNOSIS — I6340 Cerebral infarction due to embolism of unspecified cerebral artery: Secondary | ICD-10-CM

## 2012-08-31 DIAGNOSIS — I6350 Cerebral infarction due to unspecified occlusion or stenosis of unspecified cerebral artery: Secondary | ICD-10-CM

## 2012-08-31 LAB — PACEMAKER DEVICE OBSERVATION

## 2012-08-31 NOTE — Progress Notes (Signed)
Wound check ILR in office. 

## 2012-09-01 ENCOUNTER — Telehealth: Payer: Self-pay | Admitting: Internal Medicine

## 2012-09-01 ENCOUNTER — Telehealth: Payer: Self-pay

## 2012-09-01 ENCOUNTER — Telehealth: Payer: Self-pay | Admitting: Cardiovascular Disease

## 2012-09-01 NOTE — Telephone Encounter (Signed)
It looks from the notes Dr Allyson Sabal is her primary cardiologist and I have advised [name] [name] by voicemail to call his office.  I provided the number

## 2012-09-01 NOTE — Telephone Encounter (Signed)
Reviewed chart.  Pt has never been established as a pt.  Upcoming appt on 9.24.14 w/ Dr. Allyson Sabal.    Returned call and left message that pt has not been established and she should contact PCP w/ BP concerns.  Call back before 4:30pm if questions/concerns.

## 2012-09-01 NOTE — Telephone Encounter (Signed)
New Prob  [name] [name] reports that pt BP is elevated today. She said standing it is 157/87 and sitting it is 161/93.  She said yesterday when the nurse came to see the patient it was 120/78.

## 2012-09-01 NOTE — Telephone Encounter (Signed)
[facility] called concerned that patients Blood pressure has increased.  Advised them to contact patients cardiologist.  Phone number given.

## 2012-09-01 NOTE — Telephone Encounter (Signed)
[name] [name] states that [name] [name] BP is elevated today--standing 157/87--sitting 161/93.  [name] [name] is was 128/78.  Please advise.

## 2012-09-06 ENCOUNTER — Telehealth: Payer: Self-pay

## 2012-09-06 NOTE — Telephone Encounter (Signed)
[name] with [facility] called to get verbal order for additional visits.

## 2012-09-06 NOTE — Telephone Encounter (Signed)
Verbal given to continue care and follow up.

## 2012-09-08 ENCOUNTER — Encounter: Payer: Self-pay | Admitting: Internal Medicine

## 2012-09-21 ENCOUNTER — Telehealth: Payer: Self-pay

## 2012-09-21 DIAGNOSIS — I6350 Cerebral infarction due to unspecified occlusion or stenosis of unspecified cerebral artery: Secondary | ICD-10-CM

## 2012-09-21 DIAGNOSIS — I6340 Cerebral infarction due to embolism of unspecified cerebral artery: Secondary | ICD-10-CM

## 2012-09-21 NOTE — Telephone Encounter (Signed)
May start Speech therapy outpt at [facility]

## 2012-09-21 NOTE — Telephone Encounter (Signed)
[name] [name] with [facility] called to let us know she recommends out patient speech therapy.  Patient has finished all other therapies but would benefit from more speech therapy.  She has a follow up appointment coming up and they would like to get this initiated.

## 2012-09-22 NOTE — Telephone Encounter (Signed)
Order is put in for speech therapy.

## 2012-09-25 ENCOUNTER — Encounter: Payer: Self-pay | Admitting: Physical Medicine & Rehabilitation

## 2012-09-25 ENCOUNTER — Ambulatory Visit (HOSPITAL_BASED_OUTPATIENT_CLINIC_OR_DEPARTMENT_OTHER): Payer: Medicaid Other | Admitting: Physical Medicine & Rehabilitation

## 2012-09-25 ENCOUNTER — Encounter: Payer: Medicaid Other | Attending: Physical Medicine & Rehabilitation

## 2012-09-25 VITALS — BP 161/91 | HR 65 | Resp 14 | Ht 61.0 in | Wt 104.6 lb

## 2012-09-25 DIAGNOSIS — I252 Old myocardial infarction: Secondary | ICD-10-CM | POA: Insufficient documentation

## 2012-09-25 DIAGNOSIS — E785 Hyperlipidemia, unspecified: Secondary | ICD-10-CM | POA: Insufficient documentation

## 2012-09-25 DIAGNOSIS — R29898 Other symptoms and signs involving the musculoskeletal system: Secondary | ICD-10-CM | POA: Insufficient documentation

## 2012-09-25 DIAGNOSIS — I69920 Aphasia following unspecified cerebrovascular disease: Secondary | ICD-10-CM

## 2012-09-25 DIAGNOSIS — R209 Unspecified disturbances of skin sensation: Secondary | ICD-10-CM | POA: Insufficient documentation

## 2012-09-25 DIAGNOSIS — I10 Essential (primary) hypertension: Secondary | ICD-10-CM | POA: Insufficient documentation

## 2012-09-25 DIAGNOSIS — I69320 Aphasia following cerebral infarction: Secondary | ICD-10-CM

## 2012-09-25 DIAGNOSIS — R482 Apraxia: Secondary | ICD-10-CM | POA: Insufficient documentation

## 2012-09-25 DIAGNOSIS — G8321 Monoplegia of upper limb affecting right dominant side: Secondary | ICD-10-CM | POA: Insufficient documentation

## 2012-09-25 DIAGNOSIS — IMO0002 Reserved for concepts with insufficient information to code with codable children: Secondary | ICD-10-CM

## 2012-09-25 DIAGNOSIS — I2510 Atherosclerotic heart disease of native coronary artery without angina pectoris: Secondary | ICD-10-CM | POA: Insufficient documentation

## 2012-09-25 DIAGNOSIS — I69998 Other sequelae following unspecified cerebrovascular disease: Secondary | ICD-10-CM | POA: Insufficient documentation

## 2012-09-25 DIAGNOSIS — I6340 Cerebral infarction due to embolism of unspecified cerebral artery: Secondary | ICD-10-CM

## 2012-09-25 DIAGNOSIS — G8320 Monoplegia of upper limb affecting unspecified side: Secondary | ICD-10-CM

## 2012-09-25 DIAGNOSIS — Z951 Presence of aortocoronary bypass graft: Secondary | ICD-10-CM | POA: Insufficient documentation

## 2012-09-25 NOTE — Progress Notes (Signed)
Subjective:    Patient ID: [name] [name], female    DOB: 11-10-1950, 62 y.o.   MRN: [id number]  HPI This is a 62 year old right-handed female  with history of hypertension, tobacco abuse, coronary artery disease  with coronary artery bypass grafting and recent discharge after bypass  grafting July 31, 2012 who was admitted August 13, 2012, with right-sided  weakness and aphasia. MRI of the brain showed acute nonhemorrhagic left  PCA territory infarction. MRA of the head with severe distal left PCA  and basilar artery grossly patent. Echocardiogram with ejection  fraction 60%, grade 2 diastolic dysfunction. Carotid Dopplers with less  than 39% ICA stenosis. TEE showed ejection fraction 60% without  thrombus. Followup Cardiology Service for monitoring of atrial  fibrillation in relation to CVA with telemetry demonstrating sinus  rhythm and no atrial arrhythmias. The patient underwent implantable  loop recorder per Cardiology Services on August 15, 2012, for monitoring.  Falls, none since d/c from [facility]  DATE OF ADMISSION: 08/16/2012  DATE OF DISCHARGE: 08/26/2012  Therapy OT, SLP  Pain Inventory Average Pain 0 Pain Right Now 0 My pain is no pain  In the last 24 hours, has pain interfered with the following? General activity 0 Relation with others 0 Enjoyment of life 0 What TIME of day is your pain at its worst? no pain Sleep (in general) Fair  Pain is worse with: unsure Pain improves with: rest Relief from Meds: 10  Mobility walk without assistance ability to climb steps?  yes do you drive?  no  Function not employed: date last employed .  Neuro/Psych No problems in this area  Prior Studies Any changes since last visit?  no  Physicians involved in your care Any changes since last visit?  no   Family History  Problem Relation Age of Onset  . Hypertension Mother   . Stroke Brother     Died at age 10 secondary to MI  . CAD Brother    History   Social  History  . Marital Status: Single    Spouse Name: N/A    Number of Children: N/A  . Years of Education: N/A   Social History Main Topics  . Smoking status: Former Smoker -- 0.25 packs/day    Types: Cigarettes    Start date: 08/25/2012  . Smokeless tobacco: None  . Alcohol Use: No  . Drug Use: No  . Sexual Activity: Not Currently   Other Topics Concern  . None   Social History Narrative  . None   Past Surgical History  Procedure Laterality Date  . Coronary artery bypass graft N/A 07/25/2012    Procedure: CORONARY ARTERY BYPASS GRAFTING (CABG) times five with Left  Endoscopic Saphenous Vein Harvest and Left Internal  Mammary ;  Surgeon: Loreli Slot, MD;  Location: [facility] OR;  Service: Open Heart Surgery;  Laterality: N/A;  . Tee without cardioversion N/A 08/15/2012    Procedure: TRANSESOPHAGEAL ECHOCARDIOGRAM (TEE);  Surgeon: Wendall Stade, MD;  Location: [facility] ENDOSCOPY;  Service: Cardiovascular;  Laterality: N/A;  . Loop recorder implant  08-15-2012    Medtronic LinQ implanted by Dr Johney Frame for cryptogenic stroke   Past Medical History  Diagnosis Date  . Hypertension   . Hyperlipidemia LDL goal < 70 07/24/2012  . STEMI (ST elevation myocardial infarction), secondary to disease of RCA, with disease of LAD and diag. branch and chronic non dominant LCX occlusion for CABG  07/21/2012  . 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  07/25/12 07/28/2012    POSTOPERATIVE DIAGNOSIS: Severe 3-vessel coronary disease, status post  myocardial infarction.  PROCEDURE: Median sternotomy, extracorporeal circulation, coronary  artery bypass grafting x5 (left internal mammary artery to LAD,  sequential saphenous vein graft to obtuse marginal 1, obtuse marginal 2,  and posterior descending with Y graft to 1st diagonal), endoscopic vein  harvest, left thigh.    . Tobacco abuse 07/21/2012  . Stroke    BP 161/91  Pulse 65  Resp 14  Ht 5\' 1"  (1.549 m)  Wt 104 lb 9.6 oz (47.446 kg)  BMI  19.77 kg/m2  SpO2 100%   Review of Systems  Constitutional: Positive for unexpected weight change.  All other systems reviewed and are negative.       Objective:   Physical Exam  Nursing note and vitals reviewed. Constitutional: She is oriented to person, place, and time. She appears well-developed and well-nourished.  HENT:  Head: Normocephalic and atraumatic.  Eyes: EOM are normal. Pupils are equal, round, and reactive to light.  Neck: Normal range of motion.  Musculoskeletal:       Right shoulder: Normal.  Neurological: She is alert and oriented to person, place, and time. She has normal strength and normal reflexes. A sensory deficit is present. Coordination abnormal. Gait normal.  Psychiatric: She has a normal mood and affect.   Able to name tie, unable to name Ring without cueing, unable to name Pen       Assessment & Plan:  1. Left PCA infarct with right upper weakness, right upper numbness, aphasia, apraxia Will need longer term therapy. Continue home health therapy. Return to clinic one month. May need to transition to outpatient therapy however transportation may be an issue. Should be eligible for SCAT

## 2012-09-25 NOTE — Patient Instructions (Addendum)
Please call [facility] and ask about the SCAT bus Followup with primary care in regards to blood pressure

## 2012-09-26 ENCOUNTER — Ambulatory Visit (INDEPENDENT_AMBULATORY_CARE_PROVIDER_SITE_OTHER): Payer: Medicaid Other | Admitting: Thoracic Surgery (Cardiothoracic Vascular Surgery)

## 2012-09-26 ENCOUNTER — Ambulatory Visit
Admission: RE | Admit: 2012-09-26 | Discharge: 2012-09-26 | Disposition: A | Discharge status: Home / Self Care | Payer: Self-pay | Source: Ambulatory Visit | Attending: Thoracic Surgery (Cardiothoracic Vascular Surgery) | Admitting: Thoracic Surgery (Cardiothoracic Vascular Surgery)

## 2012-09-26 ENCOUNTER — Encounter: Payer: Self-pay | Admitting: Thoracic Surgery (Cardiothoracic Vascular Surgery)

## 2012-09-26 VITALS — BP 115/79 | HR 65 | Resp 16 | Ht 60.0 in | Wt 102.0 lb

## 2012-09-26 DIAGNOSIS — Z951 Presence of aortocoronary bypass graft: Secondary | ICD-10-CM

## 2012-09-26 DIAGNOSIS — I2510 Atherosclerotic heart disease of native coronary artery without angina pectoris: Secondary | ICD-10-CM

## 2012-09-26 NOTE — Progress Notes (Signed)
HPI:  [name] [name] returns for a scheduled postoperative followup visit. She is a 62 year old woman who had coronary bypass grafting x5 on June 24. Her postoperative course was uncomplicated. However she came back 3 weeks later with a left posterior cerebral artery CVA. She had right upper and lower extremity weakness and aphasia. She has been through rehabilitation and is now home. She still has some hesitancy with her speech and some loss of function of her right upper extremity. She is able to walk without assistance.  She denies any chest pain, either incisional or anginal. She has not had any problems with her breathing. Other than the residual deficits from her stroke she has no complaints.  Past Medical History  Diagnosis Date  . Hypertension   . Hyperlipidemia LDL goal < 70 07/24/2012  . STEMI (ST elevation myocardial infarction), secondary to disease of RCA, with disease of LAD and diag. branch and chronic non dominant LCX occlusion for CABG  07/21/2012  . 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 07/25/12 07/28/2012    POSTOPERATIVE DIAGNOSIS: Severe 3-vessel coronary disease, status post  myocardial infarction.  PROCEDURE: Median sternotomy, extracorporeal circulation, coronary  artery bypass grafting x5 (left internal mammary artery to LAD,  sequential saphenous vein graft to obtuse marginal 1, obtuse marginal 2,  and posterior descending with Y graft to 1st diagonal), endoscopic vein  harvest, left thigh.    . Tobacco abuse 07/21/2012  . Stroke       Current Outpatient Prescriptions  Medication Sig Dispense Refill  . atorvastatin (LIPITOR) 80 MG tablet Take 1 tablet (80 mg total) by mouth daily.  30 tablet  1  . cloNIDine (CATAPRES) 0.2 MG tablet Take 1 tablet (0.2 mg total) by mouth 2 (two) times daily.  60 tablet  1  . lisinopril (PRINIVIL,ZESTRIL) 20 MG tablet Take 1 tablet (20 mg total) by mouth 2 (two) times daily.  60 tablet  1  . metoprolol (LOPRESSOR) 50 MG  tablet Take 1 tablet (50 mg total) by mouth 2 (two) times daily.  60 tablet  1  . aspirin EC 325 MG tablet Take 1 tablet (325 mg total) by mouth daily.  30 tablet  0   No current facility-administered medications for this visit.    Physical Exam BP 115/79  Pulse 65  Resp 16  Ht 5' (1.524 m)  Wt 102 lb (46.267 kg)  BMI 19.92 kg/m2  SpO63 1% 62 year old thin woman in no acute distress Neurologic right grip 3/5, mild expressive aphasia Sternal incision well-healed, sternum stable Leg incisions well healed Cardiac regular rate and rhythm normal S1 and S2 Lungs clear with equal breath sounds bilaterally No peripheral edema  Diagnostic Tests: Clinical Data: Status post CABG. Weakness.  CHEST - 2 VIEW  Comparison: 08/14/2012.  Findings: Trachea is midline. Heart size normal. A loop recorder  projects over the heart. Lungs are clear. No pleural fluid.  IMPRESSION:  No acute findings.  Original Report Authenticated By: Leanna Battles, M.D.   Impression: 62 year old woman who underwent coronary bypass grafting x5 on June 24. Her initial postoperative course was uncomplicated and she was discharged without any issues. Unfortunately she had a stroke about 3 weeks postoperatively from which she's still recovering.  From a cardiac standpoint she's doing well. She had an echocardiogram as part of her stroke evaluation which showed good left ventricular function. She's not having any incisional pain. She is still working with physical therapy.  Plan: I will be happy to see Mrs.  [name] back at any time if I can be of any further assistance with her care

## 2012-10-11 ENCOUNTER — Ambulatory Visit: Payer: Medicaid Other | Attending: Physical Medicine & Rehabilitation | Admitting: Speech Pathology

## 2012-10-11 DIAGNOSIS — I69920 Aphasia following unspecified cerebrovascular disease: Secondary | ICD-10-CM | POA: Insufficient documentation

## 2012-10-11 DIAGNOSIS — IMO0001 Reserved for inherently not codable concepts without codable children: Secondary | ICD-10-CM | POA: Insufficient documentation

## 2012-10-20 ENCOUNTER — Ambulatory Visit: Payer: Medicaid Other

## 2012-10-23 ENCOUNTER — Ambulatory Visit: Payer: Self-pay | Admitting: Physical Medicine & Rehabilitation

## 2012-10-25 ENCOUNTER — Ambulatory Visit: Payer: Self-pay | Admitting: Cardiovascular Disease

## 2012-11-02 ENCOUNTER — Encounter: Payer: Self-pay | Admitting: Speech Pathology

## 2012-11-08 ENCOUNTER — Ambulatory Visit: Payer: Medicaid Other | Attending: Physical Medicine & Rehabilitation | Admitting: Speech Pathology

## 2012-11-08 DIAGNOSIS — IMO0001 Reserved for inherently not codable concepts without codable children: Secondary | ICD-10-CM | POA: Insufficient documentation

## 2012-11-08 DIAGNOSIS — I69920 Aphasia following unspecified cerebrovascular disease: Secondary | ICD-10-CM | POA: Insufficient documentation

## 2012-11-24 ENCOUNTER — Ambulatory Visit (INDEPENDENT_AMBULATORY_CARE_PROVIDER_SITE_OTHER): Payer: Medicaid Other | Admitting: Nurse Practitioner

## 2012-11-24 ENCOUNTER — Encounter: Payer: Self-pay | Admitting: Nurse Practitioner

## 2012-11-24 ENCOUNTER — Telehealth: Payer: Self-pay | Admitting: *Deleted

## 2012-11-24 ENCOUNTER — Encounter: Payer: Self-pay | Admitting: *Deleted

## 2012-11-24 VITALS — BP 187/82 | HR 55 | Temp 98.2°F | Ht 60.5 in | Wt 101.0 lb

## 2012-11-24 DIAGNOSIS — H53469 Homonymous bilateral field defects, unspecified side: Secondary | ICD-10-CM

## 2012-11-24 DIAGNOSIS — E785 Hyperlipidemia, unspecified: Secondary | ICD-10-CM

## 2012-11-24 DIAGNOSIS — I6350 Cerebral infarction due to unspecified occlusion or stenosis of unspecified cerebral artery: Secondary | ICD-10-CM

## 2012-11-24 DIAGNOSIS — I639 Cerebral infarction, unspecified: Secondary | ICD-10-CM

## 2012-11-24 DIAGNOSIS — I10 Essential (primary) hypertension: Secondary | ICD-10-CM

## 2012-11-24 DIAGNOSIS — G8110 Spastic hemiplegia affecting unspecified side: Secondary | ICD-10-CM

## 2012-11-24 DIAGNOSIS — G8111 Spastic hemiplegia affecting right dominant side: Secondary | ICD-10-CM

## 2012-11-24 DIAGNOSIS — H53461 Homonymous bilateral field defects, right side: Secondary | ICD-10-CM

## 2012-11-24 NOTE — Progress Notes (Signed)
[facility]  PATIENT: [name] [name] DOB: 1950-02-08   REASON FOR VISIT: follow up HISTORY FROM: patient  HISTORY OF PRESENT ILLNESS: This is a 62 year old right-handed female with history of hypertension, tobacco abuse, coronary artery disease  with coronary artery bypass grafting and recent discharge after bypass grafting July 31, 2012 who wasadmitted 08/13/12, with right-sided weakness and aphasia.  MRI of the brain showed acute nonhemorrhagic left  PCA territory infarction. MRA of the head with severe distal left PCA and basilar artery grossly patent. Echocardiogram with ejection fraction 60%, grade 2 diastolic dysfunction. Carotid Dopplers with less than 39% ICA stenosis. TEE showed ejection fraction 60% without thrombus. Followup Cardiology Service for monitoring of atrial fibrillation in relation to CVA with telemetry demonstrating sinus rhythm and no atrial arrhythmias. The patient underwent implantable loop recorder per Cardiology Services on August 15, 2012, for monitoring.   UPDATE 11/24/12 (LL):  [name] [name] comes to office for first hospital stroke follow up visit.  She states she is  Doing fine but not very active. She is living currently with her mother.  She is completed home physical therapy and occupational therapy and is applying for SCAT transportation service through [facility] with hopes that she can attend outpatient physical therapy, as she is not able to drive.  BP is elevated today in office, 187/82.  She is taking daily aspirin 325 mg, with no signs of excessive bleeding or bruising.    REVIEW OF SYSTEMS: Full 14 system review of systems performed and notable only for:  Constitutional: weight loss  Eyes: Blurred vision on right, loss of vision, eye pain  Genitourinary: Urination problems Gastrointestinal: Constipation Musculoskeletal: Joint pain   Neurological: Memory loss, confusion, and numbness, weakness, slurred speech Sleep:  Snoring   ALLERGIES: No Known Allergies  HOME MEDICATIONS: Outpatient Prescriptions Prior to Visit  Medication Sig Dispense Refill  . aspirin EC 325 MG tablet Take 1 tablet (325 mg total) by mouth daily.  30 tablet  0  . atorvastatin (LIPITOR) 80 MG tablet Take 1 tablet (80 mg total) by mouth daily.  30 tablet  1  . cloNIDine (CATAPRES) 0.2 MG tablet Take 1 tablet (0.2 mg total) by mouth 2 (two) times daily.  60 tablet  1  . lisinopril (PRINIVIL,ZESTRIL) 20 MG tablet Take 1 tablet (20 mg total) by mouth 2 (two) times daily.  60 tablet  1  . metoprolol (LOPRESSOR) 50 MG tablet Take 1 tablet (50 mg total) by mouth 2 (two) times daily.  60 tablet  1   No facility-administered medications prior to visit.    PAST MEDICAL HISTORY: Past Medical History  Diagnosis Date  . Hypertension   . Hyperlipidemia LDL goal < 70 07/24/2012  . STEMI (ST elevation myocardial infarction), secondary to disease of RCA, with disease of LAD and diag. branch and chronic non dominant LCX occlusion for CABG  07/21/2012  . 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 07/25/12 07/28/2012    POSTOPERATIVE DIAGNOSIS: Severe 3-vessel coronary disease, status post  myocardial infarction.  PROCEDURE: Median sternotomy, extracorporeal circulation, coronary  artery bypass grafting x5 (left internal mammary artery to LAD,  sequential saphenous vein graft to obtuse marginal 1, obtuse marginal 2,  and posterior descending with Y graft to 1st diagonal), endoscopic vein  harvest, left thigh.    . Tobacco abuse 07/21/2012  . Stroke     PAST SURGICAL HISTORY: Past Surgical History  Procedure Laterality Date  . Coronary artery bypass graft N/A 07/25/2012  Procedure: CORONARY ARTERY BYPASS GRAFTING (CABG) times five with Left  Endoscopic Saphenous Vein Harvest and Left Internal  Mammary ;  Surgeon: Loreli Slot, MD;  Location: [facility] OR;  Service: Open Heart Surgery;  Laterality: N/A;  . Tee without cardioversion N/A  08/15/2012    Procedure: TRANSESOPHAGEAL ECHOCARDIOGRAM (TEE);  Surgeon: Wendall Stade, MD;  Location: [facility] ENDOSCOPY;  Service: Cardiovascular;  Laterality: N/A;  . Loop recorder implant  08-15-2012    Medtronic LinQ implanted by Dr Johney Frame for cryptogenic stroke    FAMILY HISTORY: Family History  Problem Relation Age of Onset  . Hypertension Mother   . Stroke Brother     Died at age 52 secondary to MI  . CAD Brother     SOCIAL HISTORY: History   Social History  . Marital Status: Single    Spouse Name: N/A    Number of Children: 1  . Years of Education: college   Occupational History  . retired    Social History Main Topics  . Smoking status: Former Smoker -- 0.25 packs/day    Types: Cigarettes    Start date: 08/25/2012  . Smokeless tobacco: Not on file  . Alcohol Use: No  . Drug Use: No  . Sexual Activity: No   Other Topics Concern  . Not on file   Social History Narrative  . No narrative on file     PHYSICAL EXAM  Filed Vitals:   11/24/12 1536  BP: 187/82  Pulse: 55  Temp: 98.2 F (36.8 C)  TempSrc: Oral  Height: 5' 0.5" (1.537 m)  Weight: 101 lb (45.813 kg)   Body mass index is 19.39 kg/(m^2).  Generalized: Well developed, in no acute distress  Head: normocephalic and atraumatic. Oropharynx benign  Neck: Supple, no carotid bruits  Cardiac: Regular rate rhythm, no murmur  Musculoskeletal: No deformity   Neurological examination  Mentation: Alert oriented to time, place, history taking. Follows all commands speech and language fluent. MMSE 17/30 WITH DEFICITS IN YEAR, DATE, COUNTY, FLOOR, ATTENTION AND CALCULATION 0/5, RECALL 0/3, REPETITION, (UNABLE TO ATTEMPT WRITING AND DRAWING DUE TO HEMIPLEGIA.) AFT 3 ONLY. GDS 5. Cranial nerve II-XII: Fundoscopic exam reveals sharp disc margins.Pupils were equal round reactive to light extraocular movements were full, visual field were full on confrontational test. Facial sensation and strength were normal.  hearing was intact to finger rubbing bilaterally. Uvula tongue midline. head turning and shoulder shrug and were normal and symmetric.Tongue protrusion into cheek strength was normal. Motor: normal bulk and tone, full strength in the LUE and LLE, 3+/5 RUE, 4+/5 RLE, fine finger movements decreased on right, left orbits right hand. no pronator drift.  Sensory:  Decreased to light touch, pinprick on right side. Coordination: finger-nose-finger, heel-to-shin bilaterally with dysmetria on right. Reflexes: 1+ and symmetric. Gait and Station: Rising up from seated position without assistance, normal stance, without trunk ataxia, mild hemiplegic gait, able to perform tiptoe, and heel walking with mild difficulty. Unable to tandem. Romberg negative.  DIAGNOSTIC DATA (LABS, IMAGING, TESTING) - I reviewed patient records, labs, notes, testing and imaging myself where available.  Lab Results  Component Value Date   WBC 6.6 08/17/2012   HGB 10.5* 08/17/2012   HCT 31.5* 08/17/2012   MCV 92.1 08/17/2012   PLT 334 08/17/2012      Component Value Date/Time   NA 141 08/18/2012 0530   K 4.2 08/18/2012 0530   CL 109 08/18/2012 0530   CO2 25 08/18/2012 0530   GLUCOSE 97 08/18/2012  0530   BUN 4* 08/18/2012 0530   CREATININE 0.57 08/18/2012 0530   CALCIUM 9.3 08/18/2012 0530   PROT 6.4 08/17/2012 0620   ALBUMIN 3.0* 08/17/2012 0620   AST 23 08/17/2012 0620   ALT 19 08/17/2012 0620   ALKPHOS 65 08/17/2012 0620   BILITOT 0.4 08/17/2012 0620   GFRNONAA >90 08/18/2012 0530   GFRAA >90 08/18/2012 0530   Lab Results  Component Value Date   CHOL 90 08/14/2012   HDL 22* 08/14/2012   LDLCALC 54 08/14/2012   TRIG 72 08/14/2012   CHOLHDL 4.1 08/14/2012   Lab Results  Component Value Date   HGBA1C 5.4 08/14/2012   Lab Results  Component Value Date   VITAMINB12 459 08/15/2012   Lab Results  Component Value Date   TSH 0.570 07/21/2012   MRI of the brain 08/14/2012 Acute non hemorrhagic left PCA territory infarct affecting  most of that vessel's vascular territory. Chronic left parietal MCA territory infarct.  MRA of the brain 08/14/2012 Motion degraded scan. Severe disease distal left PCA. Basilar artery grossly patent. Abnormal distal right vertebral.  2D Echocardiogram EF 55-60% with no source of embolus.  TEE no ASD or LAA thrombus  Carotid Doppler No evidence of hemodynamically significant internal carotid artery stenosis. Vertebral artery flow is antegrade.   ASSESSMENT AND PLAN 62 y.o. year old female  has a past medical history of Hypertension; Hyperlipidemia LDL goal < 70 (07/24/2012); STEMI (ST elevation myocardial infarction), secondary to disease of RCA, with disease of LAD and diag. branch and chronic non dominant LCX occlusion for CABG  (07/21/2012); S/P CABG x 5 : LIMA-LAD, SVG-OM1-OM2, SVG-(Y)-DIAG-PDA. 07/25/12 (07/28/2012); Tobacco abuse (07/21/2012); and stroke here for follow up of left PCA infarct on 08/14/12.  Patient with resultant right hemiparesis, apraxia.  PLAN: Continue aspirin 325 mg orally every day  for secondary stroke prevention and maintain strict control of hypertension with blood pressure goal below 140/90, and lipids with LDL cholesterol goal below 100 mg/dL.  Followup in 3 months. Referral to Dr. Elspeth Cho for Botox injections, right arm, hand; right leg spasticity.  Orders Placed This Encounter  Procedures  . Home Health  . Face-to-face encounter (required for Medicare/Medicaid patients)  For home PT, right spastic hemiplegia, cannot drive due to dense right hemianopsia.  Ronal Fear, MSN, NP-C 11/24/2012, 4:32 PM [facility] [address] [place], [place] [address] [phone]

## 2012-11-24 NOTE — Patient Instructions (Signed)
Continue aspirin 325 mg orally every day  for secondary stroke prevention and maintain strict control of hypertension with blood pressure goal below 130/90, diabetes with hemoglobin A1c goal below 6.5% and lipids with LDL cholesterol goal below 100 mg/dL.   We will be calling you to set up an appointment with Dr. Hosie Poisson for Botox injections to your arm for spasticity.  Followup in the future with me in 3 months.

## 2012-11-27 ENCOUNTER — Ambulatory Visit: Payer: Self-pay | Admitting: Neurology

## 2012-11-30 ENCOUNTER — Encounter: Payer: Self-pay | Admitting: Internal Medicine

## 2012-12-29 ENCOUNTER — Encounter: Payer: Self-pay | Admitting: Internal Medicine

## 2012-12-29 ENCOUNTER — Ambulatory Visit (INDEPENDENT_AMBULATORY_CARE_PROVIDER_SITE_OTHER): Payer: Medicaid Other | Admitting: Internal Medicine

## 2012-12-29 ENCOUNTER — Encounter (INDEPENDENT_AMBULATORY_CARE_PROVIDER_SITE_OTHER): Payer: Self-pay

## 2012-12-29 VITALS — BP 160/96 | HR 66 | Ht 60.5 in | Wt 99.0 lb

## 2012-12-29 DIAGNOSIS — Z720 Tobacco use: Secondary | ICD-10-CM

## 2012-12-29 DIAGNOSIS — F17200 Nicotine dependence, unspecified, uncomplicated: Secondary | ICD-10-CM

## 2012-12-29 DIAGNOSIS — I6350 Cerebral infarction due to unspecified occlusion or stenosis of unspecified cerebral artery: Secondary | ICD-10-CM

## 2012-12-29 DIAGNOSIS — I10 Essential (primary) hypertension: Secondary | ICD-10-CM

## 2012-12-29 DIAGNOSIS — I6340 Cerebral infarction due to embolism of unspecified cerebral artery: Secondary | ICD-10-CM

## 2012-12-29 DIAGNOSIS — I639 Cerebral infarction, unspecified: Secondary | ICD-10-CM

## 2012-12-29 DIAGNOSIS — Z951 Presence of aortocoronary bypass graft: Secondary | ICD-10-CM

## 2012-12-29 LAB — MDC_IDC_ENUM_SESS_TYPE_INCLINIC
Date Time Interrogation Session: 20141128100935
Zone Setting Detection Interval: 2000 ms
Zone Setting Detection Interval: 3000 ms
Zone Setting Detection Interval: 360 ms

## 2012-12-29 MED ORDER — CARVEDILOL 12.5 MG PO TABS: 12.5000 mg | ORAL_TABLET | Freq: Two times a day (BID) | ORAL | Status: DC

## 2012-12-29 NOTE — Patient Instructions (Signed)
Stop Smoking.  Your physician has recommended you make the following change in your medication:  1) Stop Metoprolol 2) Start Coreg 12.5 mg twice daily  Your physician recommends that you schedule a follow-up appointment in: 6 weeks with Tereso Newcomer, PAC.  Dr. Johney Frame will see you as needed.

## 2012-12-29 NOTE — Progress Notes (Signed)
PCP: No PCP Per Patient Primary Cardiologist:  Dr Rulon Eisenmenger is a 62 y.o. female who presents today for routine electrophysiology followup.  Since hospital discharge for stroke, the patient reports doing reasonably well.   She continues to recover from her stroke. She continues to smoke. Today, she denies symptoms of palpitations, chest pain, shortness of breath,  lower extremity edema, dizziness, presyncope, or syncope.  The patient is otherwise without complaint today.   Past Medical History  Diagnosis Date  . Hypertension   . Hyperlipidemia LDL goal < 70 07/24/2012  . STEMI (ST elevation myocardial infarction), secondary to disease of RCA, with disease of LAD and diag. branch and chronic non dominant LCX occlusion for CABG  07/21/2012  . 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 07/25/12 07/28/2012    POSTOPERATIVE DIAGNOSIS: Severe 3-vessel coronary disease, status post  myocardial infarction.  PROCEDURE: Median sternotomy, extracorporeal circulation, coronary  artery bypass grafting x5 (left internal mammary artery to LAD,  sequential saphenous vein graft to obtuse marginal 1, obtuse marginal 2,  and posterior descending with Y graft to 1st diagonal), endoscopic vein  harvest, left thigh.    . Tobacco abuse 07/21/2012  . Stroke    Past Surgical History  Procedure Laterality Date  . Coronary artery bypass graft N/A 07/25/2012    Procedure: CORONARY ARTERY BYPASS GRAFTING (CABG) times five with Left  Endoscopic Saphenous Vein Harvest and Left Internal  Mammary ;  Surgeon: Loreli Slot, MD;  Location: [facility] OR;  Service: Open Heart Surgery;  Laterality: N/A;  . Tee without cardioversion N/A 08/15/2012    Procedure: TRANSESOPHAGEAL ECHOCARDIOGRAM (TEE);  Surgeon: Wendall Stade, MD;  Location: [facility] ENDOSCOPY;  Service: Cardiovascular;  Laterality: N/A;  . Loop recorder implant  08-15-2012    Medtronic LinQ implanted by Dr Johney Frame for cryptogenic stroke    Current  Outpatient Prescriptions  Medication Sig Dispense Refill  . aspirin EC 325 MG tablet Take 1 tablet (325 mg total) by mouth daily.  30 tablet  0  . atorvastatin (LIPITOR) 80 MG tablet Take 1 tablet (80 mg total) by mouth daily.  30 tablet  1  . cloNIDine (CATAPRES) 0.2 MG tablet Take 1 tablet (0.2 mg total) by mouth 2 (two) times daily.  60 tablet  1  . lisinopril (PRINIVIL,ZESTRIL) 20 MG tablet Take 1 tablet (20 mg total) by mouth 2 (two) times daily.  60 tablet  1  . metoprolol (LOPRESSOR) 50 MG tablet Take 1 tablet (50 mg total) by mouth 2 (two) times daily.  60 tablet  1   No current facility-administered medications for this visit.    Physical Exam: Filed Vitals:   12/29/12 0926  BP: 160/96  Pulse: 66  Height: 5' 0.5" (1.537 m)  Weight: 99 lb (44.906 kg)    GEN- The patient is thin appearing, alert and oriented x 3 today.   Head- normocephalic, atraumatic Eyes-  Sclera clear, conjunctiva pink Ears- hearing intact Oropharynx- clear Lungs- Clear to ausculation bilaterally, normal work of breathing Heart- Regular rate and rhythm, no murmurs, rubs or gallops, PMI not laterally displaced GI- soft, NT, ND, + BS Extremities- no clubbing, cyanosis, or edema Psych- euthymic mood, full affect ILR site is well healed   Assessment and Plan:  1. cyptogenic stroke Implantable loop recorder is interrogated today and reveals no afib I will follow remotely going forward and will only see as needed from an EP standpoint  2. CAD S/p CABG No ischemic symptoms  Switch metoprolol to coreg  3. HTN Above goal Switch metoprolol to coreg for better bp control  4. Tobacco Cessation advised  followup with Tereso Newcomer in 6 weeks She will need to then follow with Dr Allyson Sabal  I will see as needed and will follow her device with [facility]

## 2013-01-16 ENCOUNTER — Other Ambulatory Visit: Payer: Self-pay | Admitting: Physician Assistant

## 2013-02-09 ENCOUNTER — Ambulatory Visit: Payer: Medicaid Other | Admitting: Physician Assistant

## 2013-02-16 ENCOUNTER — Ambulatory Visit (INDEPENDENT_AMBULATORY_CARE_PROVIDER_SITE_OTHER): Payer: Medicaid Other | Admitting: Physician Assistant

## 2013-02-16 ENCOUNTER — Encounter: Payer: Self-pay | Admitting: Physician Assistant

## 2013-02-16 VITALS — BP 147/77 | HR 59 | Ht 61.0 in | Wt 96.0 lb

## 2013-02-16 DIAGNOSIS — E785 Hyperlipidemia, unspecified: Secondary | ICD-10-CM

## 2013-02-16 DIAGNOSIS — I6340 Cerebral infarction due to embolism of unspecified cerebral artery: Secondary | ICD-10-CM

## 2013-02-16 DIAGNOSIS — Z720 Tobacco use: Secondary | ICD-10-CM

## 2013-02-16 DIAGNOSIS — F17200 Nicotine dependence, unspecified, uncomplicated: Secondary | ICD-10-CM

## 2013-02-16 DIAGNOSIS — I10 Essential (primary) hypertension: Secondary | ICD-10-CM

## 2013-02-16 DIAGNOSIS — I2510 Atherosclerotic heart disease of native coronary artery without angina pectoris: Secondary | ICD-10-CM

## 2013-02-16 MED ORDER — AMLODIPINE BESYLATE 2.5 MG PO TABS: 2.5000 mg | ORAL_TABLET | Freq: Every day | ORAL | Status: DC

## 2013-02-16 MED ORDER — CARVEDILOL 12.5 MG PO TABS: 12.5000 mg | ORAL_TABLET | Freq: Two times a day (BID) | ORAL | Status: DC

## 2013-02-16 NOTE — Progress Notes (Signed)
[address] [place], [place]  [address] Phone: [phone] Fax:  [phone]  Date:  02/16/2013   ID:  [name] [name], DOB March 11, 1950, MRN [id number]  PCP:  No PCP Per Patient  Cardiologist:  Dr. Nanetta Batty   Electrophysiologist:  Dr. Hillis Range    History of Present Illness: [name] [name] is a 63 y.o. female with a history of CAD, status post non-STEMI in 07/2012 followed by CABG, HTN, HL, tobacco abuse, prior stroke.  She was admitted in 08/2012 with cryptogenic stroke. Echo (08/2012): EF 55-60%, normal wall motion, grade 2 diastolic dysfunction, mild to moderate LAE, mild reduced RVSF, moderate to severe RAE, mild TR, PASP 49.  TEE demonstrated an EF of 60% with no LAA clot.  Carotid US (08/2012): < 39% bilateral.  She underwent implantation of a [employer] Reveal LINQ implantable loop recorder due to concerns over underlying atrial fibrillation as a cause for her cryptogenic stroke.  She was seen in follow up by Dr. Johney Frame 12/2012. Blood pressure was above target and her metoprolol was changed to Coreg. She is brought back today for followup  She is here with her sister.  She has done well since last seen.  The patient denies chest pain, shortness of breath, syncope, orthopnea, PND or significant pedal edema.   Recent Labs: 07/21/2012: Pro B Natriuretic peptide (BNP) 1358.0*; TSH 0.570  08/14/2012: HDL Cholesterol 22*; LDL (calc) 54  08/17/2012: ALT 19; Hemoglobin 10.5*  08/18/2012: Creatinine 0.57; Potassium 4.2   Wt Readings from Last 3 Encounters:  02/16/13 96 lb (43.545 kg)  12/29/12 99 lb (44.906 kg)  11/24/12 101 lb (45.813 kg)     Past Medical History  Diagnosis Date  . Hypertension   . Hyperlipidemia LDL goal < 70 07/24/2012  . STEMI (ST elevation myocardial infarction), secondary to disease of RCA, with disease of LAD and diag. branch and chronic non dominant LCX occlusion for CABG  07/21/2012  . 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  07/25/12 07/28/2012    POSTOPERATIVE DIAGNOSIS: Severe 3-vessel coronary disease, status post  myocardial infarction.  PROCEDURE: Median sternotomy, extracorporeal circulation, coronary  artery bypass grafting x5 (left internal mammary artery to LAD,  sequential saphenous vein graft to obtuse marginal 1, obtuse marginal 2,  and posterior descending with Y graft to 1st diagonal), endoscopic vein  harvest, left thigh.    . Tobacco abuse 07/21/2012  . Stroke     Current Outpatient Prescriptions  Medication Sig Dispense Refill  . aspirin EC 325 MG tablet Take 1 tablet (325 mg total) by mouth daily.  30 tablet  0  . atorvastatin (LIPITOR) 80 MG tablet Take 1 tablet (80 mg total) by mouth daily.  30 tablet  1  . carvedilol (COREG) 12.5 MG tablet Take 1 tablet (12.5 mg total) by mouth 2 (two) times daily.  60 tablet  6  . cloNIDine (CATAPRES) 0.2 MG tablet Take 1 tablet (0.2 mg total) by mouth 2 (two) times daily.  60 tablet  1  . lisinopril (PRINIVIL,ZESTRIL) 20 MG tablet Take 1 tablet (20 mg total) by mouth 2 (two) times daily.  60 tablet  1   No current facility-administered medications for this visit.    Allergies:   Review of patient's allergies indicates no known allergies.   Social History:  The patient  reports that she has quit smoking. Her smoking use included Cigarettes. She started smoking about 5 months ago. She smoked 0.25 packs per day. She does  not have any smokeless tobacco history on file. She reports that she does not drink alcohol or use illicit drugs.   Family History:  The patient's family history includes CAD in her brother; Hypertension in her mother; Stroke in her brother.   ROS:  Please see the history of present illness.   She denies HA, bleeding problems.  She has chronic visual loss from her stroke.   All other systems reviewed and negative.   PHYSICAL EXAM: VS:  BP 147/77  Pulse 59  Ht 5\' 1"  (1.549 m)  Wt 96 lb (43.545 kg)  BMI 18.15 kg/m2 Well nourished, well  developed, in no acute distress HEENT: normal Neck: no JVD Cardiac:  normal S1, S2; RRR; no murmur Lungs:  clear to auscultation bilaterally, no wheezing, rhonchi or rales Abd: soft, nontender, no hepatomegaly Ext: no edema Skin: warm and dry Neuro:  CNs 2-12 intact, no focal abnormalities noted  EKG:  Sinus brady, HR 59, RBBB, NSSTTW changes, no significant change from prior tracing.     ASSESSMENT AND PLAN:  1. Hypertension:  Better control. She is close to target.  Add Amlodipine 2.5 mg QD.   2. CAD:  No angina.  Continue ASA, statin, beta blocker. 3. Hyperlipidemia:  Continue statin.  4. S/p Cryptogenic Stroke:  LINQ device interrogated today.  No evidence of AFib. 5. Tobacco Abuse:  I have recommended she quit.  6. Disposition:  F/u with Dr. Nanetta BattyJonathan Berry in 3 mos and Dr. Hillis RangeJames Allred prn.   Signed, Tereso NewcomerScott Abdias Hickam, PA-C  02/16/2013 11:37 AM

## 2013-02-16 NOTE — Patient Instructions (Signed)
Your physician has recommended you make the following change in your medication:   1)Start Norvasc 2.5mg  daily. An Rx has been sent to your pharmacy  2)Take all other medications as prescribed  Your physician recommends that you schedule a follow-up appointment in: 3 months with Dr.Barry ([facility] office)

## 2013-03-19 ENCOUNTER — Ambulatory Visit (INDEPENDENT_AMBULATORY_CARE_PROVIDER_SITE_OTHER): Payer: Medicaid Other | Admitting: *Deleted

## 2013-03-19 DIAGNOSIS — I6340 Cerebral infarction due to embolism of unspecified cerebral artery: Secondary | ICD-10-CM

## 2013-03-19 LAB — MDC_IDC_ENUM_SESS_TYPE_REMOTE

## 2013-03-20 ENCOUNTER — Ambulatory Visit: Payer: Medicaid Other | Admitting: Nurse Practitioner

## 2013-03-22 ENCOUNTER — Ambulatory Visit: Payer: Medicaid Other | Admitting: Nurse Practitioner

## 2013-04-20 ENCOUNTER — Ambulatory Visit (INDEPENDENT_AMBULATORY_CARE_PROVIDER_SITE_OTHER): Payer: Medicaid Other | Admitting: *Deleted

## 2013-04-20 DIAGNOSIS — I6340 Cerebral infarction due to embolism of unspecified cerebral artery: Secondary | ICD-10-CM

## 2013-05-18 LAB — MDC_IDC_ENUM_SESS_TYPE_REMOTE

## 2013-05-21 ENCOUNTER — Ambulatory Visit (INDEPENDENT_AMBULATORY_CARE_PROVIDER_SITE_OTHER): Payer: Medicaid Other | Admitting: *Deleted

## 2013-05-21 DIAGNOSIS — IMO0002 Reserved for concepts with insufficient information to code with codable children: Secondary | ICD-10-CM

## 2013-05-21 DIAGNOSIS — R482 Apraxia: Secondary | ICD-10-CM

## 2013-05-23 ENCOUNTER — Encounter: Payer: Self-pay | Admitting: Internal Medicine

## 2013-06-05 ENCOUNTER — Encounter: Payer: Self-pay | Admitting: Internal Medicine

## 2013-06-20 ENCOUNTER — Ambulatory Visit (INDEPENDENT_AMBULATORY_CARE_PROVIDER_SITE_OTHER): Payer: Medicaid Other | Admitting: *Deleted

## 2013-06-20 ENCOUNTER — Encounter: Payer: Self-pay | Admitting: Internal Medicine

## 2013-06-20 DIAGNOSIS — I6340 Cerebral infarction due to embolism of unspecified cerebral artery: Secondary | ICD-10-CM

## 2013-07-03 ENCOUNTER — Ambulatory Visit (INDEPENDENT_AMBULATORY_CARE_PROVIDER_SITE_OTHER): Payer: Medicaid Other | Admitting: Cardiovascular Disease

## 2013-07-03 ENCOUNTER — Encounter: Payer: Self-pay | Admitting: Cardiovascular Disease

## 2013-07-03 VITALS — BP 126/72 | HR 79 | Ht 60.0 in | Wt 92.1 lb

## 2013-07-03 DIAGNOSIS — I213 ST elevation (STEMI) myocardial infarction of unspecified site: Secondary | ICD-10-CM

## 2013-07-03 DIAGNOSIS — I219 Acute myocardial infarction, unspecified: Secondary | ICD-10-CM

## 2013-07-03 DIAGNOSIS — E785 Hyperlipidemia, unspecified: Secondary | ICD-10-CM

## 2013-07-03 DIAGNOSIS — Z79899 Other long term (current) drug therapy: Secondary | ICD-10-CM

## 2013-07-03 DIAGNOSIS — Z720 Tobacco use: Secondary | ICD-10-CM

## 2013-07-03 DIAGNOSIS — I10 Essential (primary) hypertension: Secondary | ICD-10-CM

## 2013-07-03 DIAGNOSIS — I6340 Cerebral infarction due to embolism of unspecified cerebral artery: Secondary | ICD-10-CM

## 2013-07-03 DIAGNOSIS — Z951 Presence of aortocoronary bypass graft: Secondary | ICD-10-CM

## 2013-07-03 DIAGNOSIS — F17200 Nicotine dependence, unspecified, uncomplicated: Secondary | ICD-10-CM

## 2013-07-03 NOTE — Assessment & Plan Note (Signed)
On statin therapy. We will recheck a lipid and liver profile 

## 2013-07-03 NOTE — Assessment & Plan Note (Signed)
She was readmitted to the there was what appeared to be an embolic stroke. Transesophageal echo was unrevealing. Dr. Johney Frame implanted a loop recorder which has not revealed any arrhythmogenic cause.

## 2013-07-03 NOTE — Assessment & Plan Note (Signed)
Controlled on current medications 

## 2013-07-03 NOTE — Patient Instructions (Signed)
Your physician recommends that you schedule a follow-up appointment in: 6 Months with PA and 12 Months with Dr Allyson Sabal  Your physician recommends that you return for lab work in: Fasting Lipid Liver

## 2013-07-03 NOTE — Assessment & Plan Note (Signed)
ST segment elevation myocardial infarction status post cardiac catheterization performed by myself 07/21/12 revealing three-vessel disease with mild LV dysfunction. Later she underwent coronary artery bypass grafting by Dr. Edwina Barth with a LIMA to LAD, vein graft to diagonal branch, sequential vein to OM1, OM 2, and  to the PDA. Her postop course was uncomplicated.she denies chest pain or shortness of breath.

## 2013-07-03 NOTE — Progress Notes (Signed)
07/03/2013 [name] BeringBrenda [name]   26-Apr-1950  [id number]  Primary Physician No PCP Per Patient Primary Cardiologist: Runell GessJonathan J. Ziair Penson MD Roseanne RenoFACP,FACC,FAHA, FSCAI   HPI:  [name] [name] is a 63 year old thin appearing married Caucasian female who I last saw a year ago. She presented with an acute ST segment elevation myocardial infarction and I took her to the cath lab on 07/21/12 revealing three-vessel disease with mild LV dysfunction. 4 days later she underwent coronary artery bypass grafting by Dr. Andrey SpearmanSteve Hendrickson with LIMA to LAD, vein to a diagonal branch and sequential vein to 2 obtuse marginal branches and to the PDA. Her postop course was uncomplicated. She was admitted to later with what was thought to be a embolic stroke and underwent workup which was unrevealing. Her other problems include continued tobacco abuse, hypertension and hyperlipidemia.   Current Outpatient Prescriptions  Medication Sig Dispense Refill  . amLODipine (NORVASC) 2.5 MG tablet Take 1 tablet (2.5 mg total) by mouth daily.  30 tablet  6  . aspirin EC 325 MG tablet Take 1 tablet (325 mg total) by mouth daily.  30 tablet  0  . atorvastatin (LIPITOR) 80 MG tablet Take 1 tablet (80 mg total) by mouth daily.  30 tablet  1  . cloNIDine (CATAPRES) 0.2 MG tablet Take 1 tablet (0.2 mg total) by mouth 2 (two) times daily.  60 tablet  1  . lisinopril (PRINIVIL,ZESTRIL) 20 MG tablet Take 1 tablet (20 mg total) by mouth 2 (two) times daily.  60 tablet  1   No current facility-administered medications for this visit.    No Known Allergies  History   Social History  . Marital Status: Single    Spouse Name: N/A    Number of Children: 1  . Years of Education: college   Occupational History  . retired    Social History Main Topics  . Smoking status: Current Some Day Smoker -- 0.25 packs/day    Types: Cigarettes    Start date: 08/25/2012  . Smokeless tobacco: Not on file  . Alcohol Use: No  . Drug Use: No  . Sexual  Activity: No   Other Topics Concern  . Not on file   Social History Narrative  . No narrative on file     Review of Systems: General: negative for chills, fever, night sweats or weight changes.  Cardiovascular: negative for chest pain, dyspnea on exertion, edema, orthopnea, palpitations, paroxysmal nocturnal dyspnea or shortness of breath Dermatological: negative for rash Respiratory: negative for cough or wheezing Urologic: negative for hematuria Abdominal: negative for nausea, vomiting, diarrhea, bright red blood per rectum, melena, or hematemesis Neurologic: negative for visual changes, syncope, or dizziness All other systems reviewed and are otherwise negative except as noted above.    Blood pressure 126/72, pulse 79, height 5' (1.524 m), weight 92 lb 1.6 oz (41.776 kg).  General appearance: alert and no distress Neck: no adenopathy, no carotid bruit, no JVD, supple, symmetrical, trachea midline and thyroid not enlarged, symmetric, no tenderness/mass/nodules Lungs: clear to auscultation bilaterally Heart: regular rate and rhythm, S1, S2 normal, no murmur, click, rub or gallop Extremities: extremities normal, atraumatic, no cyanosis or edema  EKG normal sinus rhythm at 79 with right bundle-branch block  ASSESSMENT AND PLAN:   Tobacco abuse Continues to smoke one half pack per day, recalcitrant to risk factor modification  Essential hypertension Controlled on current medications  Hyperlipidemia LDL goal < 70 On statin therapy. We will recheck a lipid and liver  profile  S/P CABG x 5 : LIMA-LAD, SVG-OM1-OM2, SVG-(Y)-DIAG-PDA. 07/25/12 ST segment elevation myocardial infarction status post cardiac catheterization performed by myself 07/21/12 revealing three-vessel disease with mild LV dysfunction. Later she underwent coronary artery bypass grafting by Dr. Edwina Barth with a LIMA to LAD, vein graft to diagonal branch, sequential vein to OM1, OM 2, and  to the PDA. Her  postop course was uncomplicated.she denies chest pain or shortness of breath.  Embolic cerebral infarction She was readmitted to the there was what appeared to be an embolic stroke. Transesophageal echo was unrevealing. Dr. Johney Frame implanted a loop recorder which has not revealed any arrhythmogenic cause.      [name] MD FACP,FACC,FAHA, [facility] 07/03/2013 12:04 PM

## 2013-07-03 NOTE — Assessment & Plan Note (Addendum)
Continues to smoke one half pack per day, recalcitrant to risk factor modification

## 2013-07-04 ENCOUNTER — Ambulatory Visit (INDEPENDENT_AMBULATORY_CARE_PROVIDER_SITE_OTHER): Payer: Medicare Other | Admitting: Nurse Practitioner

## 2013-07-04 ENCOUNTER — Encounter: Payer: Self-pay | Admitting: Nurse Practitioner

## 2013-07-04 VITALS — BP 121/85 | HR 78 | Ht 60.5 in | Wt 92.0 lb

## 2013-07-04 DIAGNOSIS — I69322 Dysarthria following cerebral infarction: Secondary | ICD-10-CM

## 2013-07-04 DIAGNOSIS — F0150 Vascular dementia without behavioral disturbance: Secondary | ICD-10-CM

## 2013-07-04 DIAGNOSIS — I69922 Dysarthria following unspecified cerebrovascular disease: Secondary | ICD-10-CM

## 2013-07-04 DIAGNOSIS — G8110 Spastic hemiplegia affecting unspecified side: Secondary | ICD-10-CM

## 2013-07-04 DIAGNOSIS — H53469 Homonymous bilateral field defects, unspecified side: Secondary | ICD-10-CM

## 2013-07-04 DIAGNOSIS — G8111 Spastic hemiplegia affecting right dominant side: Secondary | ICD-10-CM

## 2013-07-04 DIAGNOSIS — H53461 Homonymous bilateral field defects, right side: Secondary | ICD-10-CM

## 2013-07-04 MED ORDER — DONEPEZIL HCL 5 MG PO TABS: 5.0000 mg | ORAL_TABLET | Freq: Every day | ORAL | Status: DC

## 2013-07-04 NOTE — Progress Notes (Signed)
PATIENT: [name] [name] DOB: [birth year]  REASON FOR VISIT: routine follow up for stroke HISTORY FROM: patient  HISTORY OF PRESENT ILLNESS: This is a 63 year old right-handed female with history of hypertension, tobacco abuse, coronary artery disease with coronary artery bypass grafting and recent discharge after bypass grafting July 31, 2012 who was admitted 08/13/12, with right-sided weakness and aphasia. MRI of the brain showed acute nonhemorrhagic left PCA territory infarction. MRA of the head with severe distal left PCA and basilar artery grossly patent. Echocardiogram with ejection fraction 60%, grade 2 diastolic dysfunction. Carotid Dopplers with less than 39% ICA stenosis. TEE showed ejection fraction 60% without thrombus. Followup Cardiology Service for monitoring of atrial fibrillation in relation to CVA with telemetry demonstrating sinus rhythm and no atrial arrhythmias. The patient underwent implantable loop recorder per Cardiology Services on August 15, 2012, for monitoring.  UPDATE 11/24/12 (LL): [name] comes to office for first hospital stroke follow up visit. She states she is  Doing fine but not very active. She is living currently with her mother. She is completed home physical therapy and occupational therapy and [place] that she can attend outpatient physical therapy, as she is not able to drive. BP is elevated today in office, 187/82. She is taking daily aspirin 325 mg, with no signs of excessive bleeding or bruising.   UPDATE 07/04/13 (LL):  [name] returns for stroke follow up, she denies any recurrent stroke symptoms. She feels as though her walking has become more difficult and her balance is worse.  She still has some problems with aphasic speech.  She has not regained much functional use of her right hand which was her dominant hand.  Blood pressure is well controlled, it is 121/85 in the office today.    She continues to smoke some, would like to quit but has not been successful in doing so. [place]aret [place], not found in our system. Loop recorder has not detected atrial fibrillation to date.  REVIEW OF SYSTEMS: Full 14 system review of systems performed and notable only for:  Constitutional: weight loss, appetite change Eyes: Blurred vision on right, loss of vision, light sensitivity Genitourinary: Urination problems, incontinence  Gastrointestinal: Constipation  Musculoskeletal: Joint pain, walking difficulty, neck stiffness Neurological: Memory loss, numbness, weakness, slurred speech  Sleep: daytime sleepiness  ALLERGIES: No Known Allergies  HOME MEDICATIONS: Outpatient Prescriptions Prior to Visit  Medication Sig Dispense Refill  . amLODipine (NORVASC) 2.5 MG tablet Take 1 tablet (2.5 mg total) by mouth daily.  30 tablet  6  . aspirin EC 325 MG tablet Take 1 tablet (325 mg total) by mouth daily.  30 tablet  0  . atorvastatin (LIPITOR) 80 MG tablet Take 1 tablet (80 mg total) by mouth daily.  30 tablet  1  . cloNIDine (CATAPRES) 0.2 MG tablet Take 1 tablet (0.2 mg total) by mouth 2 (two) times daily.  60 tablet  1  . lisinopril (PRINIVIL,ZESTRIL) 20 MG tablet Take 1 tablet (20 mg total) by mouth 2 (two) times daily.  60 tablet  1   No facility-administered medications prior to visit.     PHYSICAL EXAM  Filed Vitals:   07/04/13 1539  BP: 121/85  Pulse: 78  Height: 5' 0.5" (1.537 m)  Weight: 92 lb (41.731 kg)   Body mass index is 17.66 kg/(m^2). No exam data present   Generalized: Well developed, in no acute distress  Head: normocephalic  and atraumatic. Oropharynx benign  Neck: Supple, no carotid bruits  Cardiac: Regular rate rhythm, no murmur  Musculoskeletal: No deformity   Neurological examination  Mentation: Alert oriented to time, place, history taking. Follows all commands speech and language fluent. MMSE 14/28, AFT 2, GDS 6.  (Last  visit:  MMSE 17/28 WITH DEFICITS IN YEAR, DATE, COUNTY, FLOOR, ATTENTION AND CALCULATION 0/5, RECALL 0/3, REPETITION, (UNABLE TO ATTEMPT WRITING AND DRAWING DUE TO HEMIPLEGIA.) AFT 3 ONLY. GDS 5. ) Cranial nerve II-XII:  Pupils were equal round reactive to light extraocular movements were full, visual field were full on confrontational test. Facial sensation and strength were normal. hearing was intact to finger rubbing bilaterally. Uvula tongue midline. head turning and shoulder shrug and were normal and symmetric.Tongue protrusion into cheek strength was normal.  Motor: normal bulk and tone, full strength in the LUE and LLE, 3+/5 RUE, 4+/5 RLE, fine finger movements decreased on right, left orbits right hand. Weak right hand grip.  no pronator drift.  Sensory: Decreased to light touch, pinprick on right side.  Coordination: finger-nose-finger, heel-to-shin bilaterally with dysmetria on right.  Reflexes: 1+ and symmetric.  Gait and Station: Rising up from seated position without assistance, normal stance, without trunk ataxia, mild hemiplegic gait, able to perform tiptoe, and heel walking with mild difficulty. Unable to tandem. Romberg negative.  ASSESSMENT AND PLAN 63 y.o. year old female has a past medical history of Hypertension; Hyperlipidemia LDL goal < 70 (07/24/2012); STEMI (ST elevation myocardial infarction), secondary to disease of RCA, with disease of LAD and diag. branch and chronic non dominant LCX occlusion for CABG (07/21/2012); S/P CABG x 5 : LIMA-LAD, SVG-OM1-OM2, SVG-(Y)-DIAG-PDA. 07/25/12 (07/28/2012); Tobacco abuse (07/21/2012); and stroke here for follow up of left PCA infarct on 08/14/12. Patient with resultant right hemiparesis, apraxia, and moderate cognitive difficulties.  PLAN:  Start Aricept for memory.  Will try 5 mg and likely not increase due to patient's small stature.  Advised of possible side effects.   Continue aspirin 325 mg orally every day for secondary stroke prevention  and maintain strict control of hypertension with blood pressure goal below 140/90, and lipids with LDL cholesterol goal below 100 mg/dL.  Home Health for home safety evaluation, PT, OT, and ST. Followup in 6 months, sooner as needed.  Orders Placed This Encounter  Procedures  . Ambulatory referral to Home Health  . Home Health  . Face-to-face encounter (required for Medicare/Medicaid patients)   Meds ordered this encounter  Medications  . donepezil (ARICEPT) 5 MG tablet    Sig: Take 1 tablet (5 mg total) by mouth at bedtime.    Dispense:  30 tablet    Refill:  5    Order Specific Question:  Supervising Provider    Answer:  Patterson Hammersmith Torra Pala, MSN, NP-C 07/04/2013, 5:08 PM [facility] [address] [place], [place] [address] [phone]  Note: This document was prepared with digital dictation and possible smart phrase technology. Any transcriptional errors that result from this process are unintentional.

## 2013-07-04 NOTE — Patient Instructions (Addendum)
Continue aspirin 325 mg orally every day for secondary stroke prevention and maintain strict control of hypertension with blood pressure goal below 140/90, and lipids with LDL cholesterol goal below 100 mg/dL.   I am starting you on a medication called Donepizil to help with your memory.  Take 1 tablet each day, in the morning.  The side effects could be upset stomach, decreased appetite, or diarrhea.  If these are severe, you may stop the medication.   It was sent to the [facility] pharmacy.  I am ordering Home Health Physical Therapy, Occupational Therapy and Speech Therapy.  Someone should call you within the next 2 weeks to set up these appointments. Followup in 6 months, sooner as needed.  Stroke Prevention Some medical conditions and behaviors are associated with an increased chance of having a stroke. You may prevent a stroke by making healthy choices and managing medical conditions. HOW CAN I REDUCE MY RISK OF HAVING A STROKE?   Stay physically active. Get at least 30 minutes of activity on most or all days.  Do not smoke. It may also be helpful to avoid exposure to secondhand smoke.  Limit alcohol use. Moderate alcohol use is considered to be:  No more than 2 drinks per day for men.  No more than 1 drink per day for nonpregnant women.  Eat healthy foods. This involves  Eating 5 or more servings of fruits and vegetables a day.  Following a diet that addresses high blood pressure (hypertension), high cholesterol, diabetes, or obesity.  Manage your cholesterol levels.  A diet low in saturated fat, trans fat, and cholesterol and high in fiber may control cholesterol levels.  Take any prescribed medicines to control cholesterol as directed by your health care provider.  Manage your diabetes.  A controlled-carbohydrate, controlled-sugar diet is recommended to manage diabetes.  Take any prescribed medicines to control diabetes as directed by your health care provider.  Control your  hypertension.  A low-salt (sodium), low-saturated fat, low-trans fat, and low-cholesterol diet is recommended to manage hypertension.  Take any prescribed medicines to control hypertension as directed by your health care provider.  Maintain a healthy weight.  A reduced-calorie, low-sodium, low-saturated fat, low-trans fat, low-cholesterol diet is recommended to manage weight.  Stop drug abuse.  Avoid taking birth control pills.  Talk to your health care provider about the risks of taking birth control pills if you are over 82 years old, smoke, get migraines, or have ever had a blood clot.  Get evaluated for sleep disorders (sleep apnea).  Talk to your health care provider about getting a sleep evaluation if you snore a lot or have excessive sleepiness.  Take medicines as directed by your health care provider.  For some people, aspirin or blood thinners (anticoagulants) are helpful in reducing the risk of forming abnormal blood clots that can lead to stroke. If you have the irregular heart rhythm of atrial fibrillation, you should be on a blood thinner unless there is a good reason you cannot take them.  Understand all your medicine instructions.  Make sure that other other conditions (such as anemia or atherosclerosis) are addressed. SEEK IMMEDIATE MEDICAL CARE IF:   You have sudden weakness or numbness of the face, arm, or leg, especially on one side of the body.  Your face or eyelid droops to one side.  You have sudden confusion.  You have trouble speaking (aphasia) or understanding.  You have sudden trouble seeing in one or both eyes.  You have  sudden trouble walking.  You have dizziness.  You have a loss of balance or coordination.  You have a sudden, severe headache with no known cause.  You have new chest pain or an irregular heartbeat. Any of these symptoms may represent a serious problem that is an emergency. Do not wait to see if the symptoms will go away. Get  medical help at once. Call your local emergency services  (911 in [place] Do not drive yourself to the hospital. Document Released: 02/26/2004 Document Revised: 11/08/2012 Document Reviewed: 07/21/2012 [facility] Patient Information 2014 [employer] [place].

## 2013-07-05 ENCOUNTER — Telehealth: Payer: Self-pay | Admitting: Neurology

## 2013-07-05 ENCOUNTER — Encounter: Payer: Self-pay | Admitting: Nurse Practitioner

## 2013-07-05 LAB — HEPATIC FUNCTION PANEL
ALBUMIN: 4.2 g/dL (ref 3.5–5.2)
ALT: 15 U/L (ref 0–35)
AST: 18 U/L (ref 0–37)
Alkaline Phosphatase: 54 U/L (ref 39–117)
BILIRUBIN INDIRECT: 0.5 mg/dL (ref 0.2–1.2)
Bilirubin, Direct: 0.1 mg/dL (ref 0.0–0.3)
TOTAL PROTEIN: 6.9 g/dL (ref 6.0–8.3)
Total Bilirubin: 0.6 mg/dL (ref 0.2–1.2)

## 2013-07-05 LAB — LIPID PANEL
CHOL/HDL RATIO: 2.7 ratio
Cholesterol: 125 mg/dL (ref 0–200)
HDL: 47 mg/dL (ref >39)
LDL CALC: 64 mg/dL (ref 0–99)
Triglycerides: 72 mg/dL (ref <150)
VLDL: 14 mg/dL (ref 0–40)

## 2013-07-05 NOTE — Telephone Encounter (Signed)
Dareen Piano from [facility] calling to let us know that they have reached out to patient and scheduled an appointment with them next week since the sister wants to be present during the appointment. Also, Dareen Piano states that they will need a "face to face" and they will fax over the form to [place]. If questions, please return her call.

## 2013-07-06 NOTE — Telephone Encounter (Signed)
Noted. RR

## 2013-07-10 ENCOUNTER — Ambulatory Visit: Payer: Self-pay | Admitting: Family Medicine

## 2013-07-10 ENCOUNTER — Telehealth: Payer: Self-pay | Admitting: Neurology

## 2013-07-10 ENCOUNTER — Encounter: Payer: Self-pay | Admitting: *Deleted

## 2013-07-10 NOTE — Telephone Encounter (Signed)
Went to see patient and would like to continue with therapy twice a week for 3 weeks and wants to add Occupational therapy as well.

## 2013-07-10 NOTE — Progress Notes (Signed)
I agree with above 

## 2013-07-11 ENCOUNTER — Telehealth: Payer: Self-pay | Admitting: Neurology

## 2013-07-11 NOTE — Telephone Encounter (Signed)
Noted.  -LL 

## 2013-07-11 NOTE — Telephone Encounter (Signed)
Called [place] with [facility], gave verbal order for continued therapy and add OT.

## 2013-07-11 NOTE — Telephone Encounter (Signed)
Spoke with Royetta Crochet), just started today with patient but wanted to have therapy for twice a week for 3 weeks and add Occupational therapy to help patient with fine motor tasks, work hands. A verbal order will be fine

## 2013-07-11 NOTE — Telephone Encounter (Signed)
Britta Mccreedy with [facility] called to state that they will discharging the patient as the patient and family have decided to go with [facility]. No return call needed she just wanted Dr. Hosie Poisson aware.

## 2013-07-12 ENCOUNTER — Telehealth: Payer: Self-pay | Admitting: Neurology

## 2013-07-12 NOTE — Telephone Encounter (Signed)
Donnie, occupational therapist from [facility] calling to inform that he has completed patient's evaluation and will do therapy 2 times a week for 2 weeks and one time a week for one week. If questions, please return call.

## 2013-07-12 NOTE — Telephone Encounter (Signed)
Noted.  -LL

## 2013-07-20 ENCOUNTER — Ambulatory Visit (INDEPENDENT_AMBULATORY_CARE_PROVIDER_SITE_OTHER): Payer: Medicaid Other | Admitting: *Deleted

## 2013-07-20 ENCOUNTER — Telehealth: Payer: Self-pay | Admitting: Nurse Practitioner

## 2013-07-20 DIAGNOSIS — I639 Cerebral infarction, unspecified: Secondary | ICD-10-CM

## 2013-07-20 DIAGNOSIS — I6350 Cerebral infarction due to unspecified occlusion or stenosis of unspecified cerebral artery: Secondary | ICD-10-CM

## 2013-07-20 LAB — MDC_IDC_ENUM_SESS_TYPE_REMOTE

## 2013-07-20 NOTE — Telephone Encounter (Signed)
Doni, Occupational therapist with [facility] wanted to notify Dr. Pearlean Brownie of missed visit today.  Patient not at home and can't reach via phone.  Will not meet frequency for the week.

## 2013-07-23 NOTE — Telephone Encounter (Signed)
noted 

## 2013-07-27 NOTE — Progress Notes (Signed)
Loop recorder 

## 2013-07-30 LAB — MDC_IDC_ENUM_SESS_TYPE_REMOTE

## 2013-08-08 ENCOUNTER — Telehealth: Payer: Self-pay | Admitting: Neurology

## 2013-08-08 ENCOUNTER — Encounter: Payer: Self-pay | Admitting: Neurology

## 2013-08-08 NOTE — Telephone Encounter (Signed)
Spoke to patient's sister Drinda Butts regarding rescheduling 01/09/14 appointment per Dr. Marlis Edelson schedule, patient's sister verbalized understanding. Printed and mailed letter with new appointment time.

## 2013-08-10 ENCOUNTER — Encounter: Payer: Self-pay | Admitting: Internal Medicine

## 2013-08-21 ENCOUNTER — Ambulatory Visit (INDEPENDENT_AMBULATORY_CARE_PROVIDER_SITE_OTHER): Payer: Medicaid Other | Admitting: *Deleted

## 2013-08-21 DIAGNOSIS — I639 Cerebral infarction, unspecified: Secondary | ICD-10-CM

## 2013-08-21 DIAGNOSIS — I6350 Cerebral infarction due to unspecified occlusion or stenosis of unspecified cerebral artery: Secondary | ICD-10-CM

## 2013-08-21 LAB — MDC_IDC_ENUM_SESS_TYPE_REMOTE

## 2013-08-22 NOTE — Progress Notes (Signed)
Loop recorder 

## 2013-09-11 ENCOUNTER — Telehealth: Payer: Self-pay | Admitting: Neurology

## 2013-09-11 ENCOUNTER — Encounter: Payer: Self-pay | Admitting: Neurology

## 2013-09-11 NOTE — Telephone Encounter (Signed)
Spoke to patient's sister Drinda Butts regarding rescheduling 01/11/14 appointment per Dr. Marlis Edelson schedule, sister verbalized understanding, printed and mailed letter with new appointment time.

## 2013-09-20 ENCOUNTER — Encounter: Payer: Self-pay | Admitting: Internal Medicine

## 2013-09-20 ENCOUNTER — Ambulatory Visit (INDEPENDENT_AMBULATORY_CARE_PROVIDER_SITE_OTHER): Payer: Medicaid Other | Admitting: *Deleted

## 2013-09-20 DIAGNOSIS — I639 Cerebral infarction, unspecified: Secondary | ICD-10-CM

## 2013-09-20 DIAGNOSIS — I6350 Cerebral infarction due to unspecified occlusion or stenosis of unspecified cerebral artery: Secondary | ICD-10-CM

## 2013-09-21 NOTE — Progress Notes (Signed)
Loop recorder 

## 2013-10-04 DIAGNOSIS — K5900 Constipation, unspecified: Secondary | ICD-10-CM | POA: Insufficient documentation

## 2013-10-05 ENCOUNTER — Telehealth: Payer: Self-pay | Admitting: *Deleted

## 2013-10-05 LAB — MDC_IDC_ENUM_SESS_TYPE_REMOTE

## 2013-10-05 NOTE — Telephone Encounter (Signed)
Dr Allyson Sabal reviewed the chart and gave clearance to proceed with colonscopy.  Form faxed back to [facility]

## 2013-10-11 ENCOUNTER — Encounter: Payer: Self-pay | Admitting: Internal Medicine

## 2013-10-17 ENCOUNTER — Ambulatory Visit (INDEPENDENT_AMBULATORY_CARE_PROVIDER_SITE_OTHER): Payer: Medicaid Other | Admitting: Internal Medicine

## 2013-10-17 ENCOUNTER — Encounter: Payer: Self-pay | Admitting: Internal Medicine

## 2013-10-17 VITALS — BP 126/62 | HR 65 | Ht 61.0 in | Wt 92.2 lb

## 2013-10-17 DIAGNOSIS — Z720 Tobacco use: Secondary | ICD-10-CM

## 2013-10-17 DIAGNOSIS — I480 Paroxysmal atrial fibrillation: Secondary | ICD-10-CM

## 2013-10-17 DIAGNOSIS — E785 Hyperlipidemia, unspecified: Secondary | ICD-10-CM

## 2013-10-17 DIAGNOSIS — I4891 Unspecified atrial fibrillation: Secondary | ICD-10-CM

## 2013-10-17 DIAGNOSIS — I10 Essential (primary) hypertension: Secondary | ICD-10-CM

## 2013-10-17 DIAGNOSIS — F17200 Nicotine dependence, unspecified, uncomplicated: Secondary | ICD-10-CM

## 2013-10-17 DIAGNOSIS — Z951 Presence of aortocoronary bypass graft: Secondary | ICD-10-CM

## 2013-10-17 LAB — MDC_IDC_ENUM_SESS_TYPE_INCLINIC
MDC IDC SESS DTM: 20150916190403
MDC IDC SET ZONE DETECTION INTERVAL: 2000 ms
MDC IDC SET ZONE DETECTION INTERVAL: 3000 ms
Zone Setting Detection Interval: 360 ms

## 2013-10-17 MED ORDER — APIXABAN 5 MG PO TABS: 5.0000 mg | ORAL_TABLET | Freq: Two times a day (BID) | ORAL | Status: DC

## 2013-10-17 NOTE — Patient Instructions (Addendum)
Your physician recommends that you schedule a follow-up appointment in: 4 weeks in ANTICOAGULATION CLINIC.  Your physician recommends that you have lab work TODAY (BMET, CBC).  Your physician has recommended you make the following change in your medication:  1) STOP ASPIRIN 2) START ELIQUIS 5 mg BID  STOP SMOKING! Smoking Cessation Quitting smoking is important to your health and has many advantages. However, it is not always easy to quit since nicotine is a very addictive drug. Oftentimes, people try 3 times or more before being able to quit. This document explains the best ways for you to prepare to quit smoking. Quitting takes hard work and a lot of effort, but you can do it. ADVANTAGES OF QUITTING SMOKING  You will live longer, feel better, and live better.  Your body will feel the impact of quitting smoking almost immediately.  Within 20 minutes, blood pressure decreases. Your pulse returns to its normal level.  After 8 hours, carbon monoxide levels in the blood return to normal. Your oxygen level increases.  After 24 hours, the chance of having a heart attack starts to decrease. Your breath, hair, and body stop smelling like smoke.  After 48 hours, damaged nerve endings begin to recover. Your sense of taste and smell improve.  After 72 hours, the body is virtually free of nicotine. Your bronchial tubes relax and breathing becomes easier.  After 2 to 12 weeks, lungs can hold more air. Exercise becomes easier and circulation improves.  The risk of having a heart attack, stroke, cancer, or lung disease is greatly reduced.  After 1 year, the risk of coronary heart disease is cut in half.  After 5 years, the risk of stroke falls to the same as a nonsmoker.  After 10 years, the risk of lung cancer is cut in half and the risk of other cancers decreases significantly.  After 15 years, the risk of coronary heart disease drops, usually to the level of a nonsmoker.  If you are  pregnant, quitting smoking will improve your chances of having a healthy baby.  The people you live with, especially any children, will be healthier.  You will have extra money to spend on things other than cigarettes. QUESTIONS TO THINK ABOUT BEFORE ATTEMPTING TO QUIT You may want to talk about your answers with your health care provider.  Why do you want to quit?  If you tried to quit in the past, what helped and what did not?  What will be the most difficult situations for you after you quit? How will you plan to handle them?  Who can help you through the tough times? Your family? Friends? A health care provider?  What pleasures do you get from smoking? What ways can you still get pleasure if you quit? Here are some questions to ask your health care provider:  How can you help me to be successful at quitting?  What medicine do you think would be best for me and how should I take it?  What should I do if I need more help?  What is smoking withdrawal like? How can I get information on withdrawal? GET READY  Set a quit date.  Change your environment by getting rid of all cigarettes, ashtrays, matches, and lighters in your home, car, or work. Do not let people smoke in your home.  Review your past attempts to quit. Think about what worked and what did not. GET SUPPORT AND ENCOURAGEMENT You have a better chance of being successful if  you have help. You can get support in many ways.  Tell your family, friends, and coworkers that you are going to quit and need their support. Ask them not to smoke around you.  Get individual, group, or telephone counseling and support. Programs are available at [employer] and health centers. Call your local health department for information about programs in your area.  Spiritual beliefs and practices may help some smokers quit.  Download a "quit meter" on your computer to keep track of quit statistics, such as how long you have gone without  smoking, cigarettes not smoked, and money saved.  Get a self-help book about quitting smoking and staying off tobacco. LEARN NEW SKILLS AND BEHAVIORS  Distract yourself from urges to smoke. Talk to someone, go for a walk, or occupy your time with a task.  Change your normal routine. Take a different route to work. Drink tea instead of coffee. Eat breakfast in a different place.  Reduce your stress. Take a hot bath, exercise, or read a book.  Plan something enjoyable to do every day. Reward yourself for not smoking.  Explore interactive web-based programs that specialize in helping you quit. GET MEDICINE AND USE IT CORRECTLY Medicines can help you stop smoking and decrease the urge to smoke. Combining medicine with the above behavioral methods and support can greatly increase your chances of successfully quitting smoking.  Nicotine replacement therapy helps deliver nicotine to your body without the negative effects and risks of smoking. Nicotine replacement therapy includes nicotine gum, lozenges, inhalers, nasal sprays, and skin patches. Some may be available over-the-counter and others require a prescription.  Antidepressant medicine helps people abstain from smoking, but how this works is unknown. This medicine is available by prescription.  Nicotinic receptor partial agonist medicine simulates the effect of nicotine in your brain. This medicine is available by prescription. Ask your health care provider for advice about which medicines to use and how to use them based on your health history. Your health care provider will tell you what side effects to look out for if you choose to be on a medicine or therapy. Carefully read the information on the package. Do not use any other product containing nicotine while using a nicotine replacement product.  RELAPSE OR DIFFICULT SITUATIONS Most relapses occur within the first 3 months after quitting. Do not be discouraged if you start smoking again.  Remember, most people try several times before finally quitting. You may have symptoms of withdrawal because your body is used to nicotine. You may crave cigarettes, be irritable, feel very hungry, cough often, get headaches, or have difficulty concentrating. The withdrawal symptoms are only temporary. They are strongest when you first quit, but they will go away within 10-14 days. To reduce the chances of relapse, try to:  Avoid drinking alcohol. Drinking lowers your chances of successfully quitting.  Reduce the amount of caffeine you consume. Once you quit smoking, the amount of caffeine in your body increases and can give you symptoms, such as a rapid heartbeat, sweating, and anxiety.  Avoid smokers because they can make you want to smoke.  Do not let weight gain distract you. Many smokers will gain weight when they quit, usually less than 10 pounds. Eat a healthy diet and stay active. You can always lose the weight gained after you quit.  Find ways to improve your mood other than smoking. FOR MORE INFORMATION  www.smokefree.gov  Document Released: 01/12/2001 Document Revised: 06/04/2013 Document Reviewed: 04/29/2011 [employer] Patient Information 2015  [employer]. This information is not intended to replace advice given to you by your health care provider. Make sure you discuss any questions you have with your health care provider.

## 2013-10-18 ENCOUNTER — Telehealth: Payer: Self-pay | Admitting: *Deleted

## 2013-10-18 ENCOUNTER — Encounter: Payer: Self-pay | Admitting: Internal Medicine

## 2013-10-18 DIAGNOSIS — E876 Hypokalemia: Secondary | ICD-10-CM

## 2013-10-18 LAB — CBC WITH DIFFERENTIAL/PLATELET
BASOS PCT: 0.3 % (ref 0.0–3.0)
Basophils Absolute: 0 10*3/uL (ref 0.0–0.1)
EOS ABS: 0.1 10*3/uL (ref 0.0–0.7)
EOS PCT: 1.6 % (ref 0.0–5.0)
HCT: 37.6 % (ref 36.0–46.0)
HEMOGLOBIN: 12.5 g/dL (ref 12.0–15.0)
LYMPHS PCT: 34.6 % (ref 12.0–46.0)
Lymphs Abs: 2.5 10*3/uL (ref 0.7–4.0)
MCHC: 33.3 g/dL (ref 30.0–36.0)
MCV: 95.4 fl (ref 78.0–100.0)
MONOS PCT: 4.4 % (ref 3.0–12.0)
Monocytes Absolute: 0.3 10*3/uL (ref 0.1–1.0)
NEUTROS ABS: 4.2 10*3/uL (ref 1.4–7.7)
NEUTROS PCT: 59.1 % (ref 43.0–77.0)
Platelets: 243 10*3/uL (ref 150.0–400.0)
RBC: 3.94 Mil/uL (ref 3.87–5.11)
RDW: 13.9 % (ref 11.5–15.5)
WBC: 7.2 10*3/uL (ref 4.0–10.5)

## 2013-10-18 LAB — BASIC METABOLIC PANEL
BUN: 10 mg/dL (ref 6–23)
CALCIUM: 9.1 mg/dL (ref 8.4–10.5)
CO2: 25 mEq/L (ref 19–32)
Chloride: 108 mEq/L (ref 96–112)
Creatinine, Ser: 0.7 mg/dL (ref 0.4–1.2)
GFR: 109.64 mL/min (ref >60.00)
Glucose, Bld: 90 mg/dL (ref 70–99)
Potassium: 3.3 mEq/L — ABNORMAL LOW (ref 3.5–5.1)
Sodium: 141 mEq/L (ref 135–145)

## 2013-10-18 MED ORDER — POTASSIUM CHLORIDE CRYS ER 20 MEQ PO TBCR: 20.0000 meq | EXTENDED_RELEASE_TABLET | Freq: Every day | ORAL | Status: DC

## 2013-10-18 NOTE — Telephone Encounter (Signed)
Discussed labs form 9/16 with Dr. Patty Sermons (DOD)---K+ 3.3 Rec starting KDur 20 mEq daily and recheck BMET in 2 weeks. Called patient to inform. She verbalizes understanding. States will pick up medication at [facility] but requests I call her sister, Trish Mage to inform also because she will have to arrange to bring her to have repeat labs drawn in two weeks.  Called the number she provided for Trish Mage, [phone].  Left message on her voicemail to call back to discuss poc for her sister.

## 2013-10-19 ENCOUNTER — Encounter: Payer: Self-pay | Admitting: Internal Medicine

## 2013-10-19 ENCOUNTER — Ambulatory Visit (INDEPENDENT_AMBULATORY_CARE_PROVIDER_SITE_OTHER): Payer: Medicaid Other | Admitting: *Deleted

## 2013-10-19 ENCOUNTER — Telehealth: Payer: Self-pay | Admitting: Internal Medicine

## 2013-10-19 DIAGNOSIS — I4891 Unspecified atrial fibrillation: Secondary | ICD-10-CM | POA: Insufficient documentation

## 2013-10-19 DIAGNOSIS — I6350 Cerebral infarction due to unspecified occlusion or stenosis of unspecified cerebral artery: Secondary | ICD-10-CM

## 2013-10-19 DIAGNOSIS — I480 Paroxysmal atrial fibrillation: Secondary | ICD-10-CM | POA: Insufficient documentation

## 2013-10-19 DIAGNOSIS — I639 Cerebral infarction, unspecified: Secondary | ICD-10-CM

## 2013-10-19 NOTE — Telephone Encounter (Signed)
°  Follow Up ° °Returning call from yesterday. Please call. °

## 2013-10-19 NOTE — Progress Notes (Signed)
PCP: No PCP Per Patient Primary Cardiologist:  Dr Rulon Eisenmenger is a 63 y.o. female who presents today for electrophysiology followup.  Since her last visit, the patient reports doing reasonably well.   She continues to recover from her stroke. She continues to smoke and is not yet ready to quit. Today, she denies symptoms of palpitations, chest pain, shortness of breath,  lower extremity edema, dizziness, presyncope, or syncope.  The patient is otherwise without complaint today.  She recently had her Carelink transmitter disconnected for a prolonged period.  Once this was hooked up and again transmitting, it was confirmed that she had had afib.  She is therefore brought to our clinic today to discuss anticoagulation for stroke prevention.  Past Medical History  Diagnosis Date  . Hypertension   . Hyperlipidemia LDL goal < 70 07/24/2012  . STEMI (ST elevation myocardial infarction), secondary to disease of RCA, with disease of LAD and diag. branch and chronic non dominant LCX occlusion for CABG  07/21/2012  . 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 07/25/12 07/28/2012    POSTOPERATIVE DIAGNOSIS: Severe 3-vessel coronary disease, status post  myocardial infarction.  PROCEDURE: Median sternotomy, extracorporeal circulation, coronary  artery bypass grafting x5 (left internal mammary artery to LAD,  sequential saphenous vein graft to obtuse marginal 1, obtuse marginal 2,  and posterior descending with Y graft to 1st diagonal), endoscopic vein  harvest, left thigh.    . Tobacco abuse 07/21/2012  . Stroke    Past Surgical History  Procedure Laterality Date  . Coronary artery bypass graft N/A 07/25/2012    Procedure: CORONARY ARTERY BYPASS GRAFTING (CABG) times five with Left  Endoscopic Saphenous Vein Harvest and Left Internal  Mammary ;  Surgeon: Loreli Slot, MD;  Location: [facility] OR;  Service: Open Heart Surgery;  Laterality: N/A;  . Tee without cardioversion N/A 08/15/2012   Procedure: TRANSESOPHAGEAL ECHOCARDIOGRAM (TEE);  Surgeon: Wendall Stade, MD;  Location: [facility] ENDOSCOPY;  Service: Cardiovascular;  Laterality: N/A;  . Loop recorder implant  08-15-2012    Medtronic LinQ implanted by Dr Johney Frame for cryptogenic stroke    Current Outpatient Prescriptions  Medication Sig Dispense Refill  . amLODipine (NORVASC) 10 MG tablet Take 10 mg by mouth daily.      Marland Kitchen atorvastatin (LIPITOR) 80 MG tablet Take 1 tablet (80 mg total) by mouth daily.  30 tablet  1  . Cholecalciferol (VITAMIN D) 400 UNITS capsule Take 800 Units by mouth daily.      . cloNIDine (CATAPRES) 0.2 MG tablet Take 1 tablet (0.2 mg total) by mouth 2 (two) times daily.  60 tablet  1  . donepezil (ARICEPT) 5 MG tablet Take 1 tablet (5 mg total) by mouth at bedtime.  30 tablet  5  . lisinopril (PRINIVIL,ZESTRIL) 40 MG tablet Take 40 mg by mouth daily.      Marland Kitchen apixaban (ELIQUIS) 5 MG TABS tablet Take 1 tablet (5 mg total) by mouth 2 (two) times daily.  60 tablet  3  . potassium chloride SA (K-DUR,KLOR-CON) 20 MEQ tablet Take 1 tablet (20 mEq total) by mouth daily.  90 tablet  3   No current facility-administered medications for this visit.    Physical Exam: Filed Vitals:   10/17/13 1513  BP: 126/62  Pulse: 65  Height: 5\' 1"  (1.549 m)  Weight: 92 lb 3.2 oz (41.822 kg)    GEN- The patient is thin appearing, alert and oriented x 3 today.   Head-  normocephalic, atraumatic Eyes-  Sclera clear, conjunctiva pink Ears- hearing intact Oropharynx- clear Lungs- Clear to ausculation bilaterally, normal work of breathing Heart- Regular rate and rhythm, no murmurs, rubs or gallops, PMI not laterally displaced GI- soft, NT, ND, + BS Extremities- no clubbing, cyanosis, or edema Psych- euthymic mood, full affect ILR site is well healed   Assessment and Plan:  1. Afib Recently detected on ILR.  She is asymptomatic with this. She has a chads2vasc score of at least 6.   Today, I discussed Coumadin as well as  novel anticoagulants including Pradaxa, Xarelto, Savaysa, and Eliquis today as indicated for risk reduction in stroke and systemic emboli with nonvalvular atrial fibrillation.  Risks, benefits, and alternatives to each of these drugs were discussed at length today.  I will stop ASA today and start eliquis.  BMET and CBC today.  Return to the anticoagulation clinic in 4 weeks for further assessment.  2. CAD S/p CABG No ischemic symptoms Switch metoprolol to coreg  3. HTN Stable No change required today  4. Tobacco Cessation advised  followup with the anticoagulation clinic in 4 weeks She will need to then follow with Dr Allyson Sabal  I will see as needed and will follow her device with carelink

## 2013-10-22 LAB — MDC_IDC_ENUM_SESS_TYPE_REMOTE

## 2013-10-22 NOTE — Telephone Encounter (Signed)
lmtcb

## 2013-10-23 LAB — MDC_IDC_ENUM_SESS_TYPE_REMOTE

## 2013-10-23 NOTE — Progress Notes (Signed)
Loop recorder 

## 2013-10-23 NOTE — Telephone Encounter (Signed)
She is aware of her results and will have the patient here for labs on 11/02/13

## 2013-10-23 NOTE — Telephone Encounter (Signed)
Follow Up ° °Pt returned call//  °

## 2013-10-23 NOTE — Telephone Encounter (Signed)
Follow Up  ° °Returned call  °

## 2013-10-26 ENCOUNTER — Other Ambulatory Visit (INDEPENDENT_AMBULATORY_CARE_PROVIDER_SITE_OTHER): Payer: Medicaid Other

## 2013-10-26 DIAGNOSIS — E876 Hypokalemia: Secondary | ICD-10-CM

## 2013-10-26 LAB — BASIC METABOLIC PANEL
BUN: 9 mg/dL (ref 6–23)
CHLORIDE: 105 meq/L (ref 96–112)
CO2: 26 mEq/L (ref 19–32)
CREATININE: 0.9 mg/dL (ref 0.4–1.2)
Calcium: 9.4 mg/dL (ref 8.4–10.5)
GFR: 83.9 mL/min (ref >60.00)
Glucose, Bld: 86 mg/dL (ref 70–99)
POTASSIUM: 4.1 meq/L (ref 3.5–5.1)
Sodium: 135 mEq/L (ref 135–145)

## 2013-10-26 NOTE — Progress Notes (Signed)
Quick Note:  Please report to patient. The recent labs are stable. Continue same medication and careful diet. K+ is normal now. ______

## 2013-11-02 ENCOUNTER — Other Ambulatory Visit: Payer: Medicaid Other

## 2013-11-15 ENCOUNTER — Ambulatory Visit (INDEPENDENT_AMBULATORY_CARE_PROVIDER_SITE_OTHER): Payer: Medicaid Other | Admitting: *Deleted

## 2013-11-15 ENCOUNTER — Ambulatory Visit (INDEPENDENT_AMBULATORY_CARE_PROVIDER_SITE_OTHER): Payer: Medicaid Other | Admitting: Pharmacist Clinician (PhC)/ Clinical Pharmacy Specialist

## 2013-11-15 DIAGNOSIS — I480 Paroxysmal atrial fibrillation: Secondary | ICD-10-CM

## 2013-11-15 DIAGNOSIS — I639 Cerebral infarction, unspecified: Secondary | ICD-10-CM

## 2013-11-15 NOTE — Progress Notes (Signed)
Pt was started on Eliquis for atrial fibrillation on October 17, 2013.    Reviewed patients medication list.  Pt is not currently on any combined P-gp and strong CYP3A4 inhibitors/inducers (ketoconazole, traconazole, ritonavir, carbamazepine, phenytoin, rifampin, St. John's wort).  Reviewed labs.  SCr 0.9, Weight 92lbs, CrCl- 41.  Dose appropriate based on CrCl.   Hgb and HCT WNL at 12.6/37.6  A full discussion of the nature of anticoagulants has been carried out.  A benefit/risk analysis has been presented to the patient, so that they understand the justification for choosing anticoagulation with Eliquis at this time.  The need for compliance is stressed.  Pt is aware to take the medication twice daily.  Side effects of potential bleeding are discussed, including unusual colored urine or stools, coughing up blood or coffee ground emesis, nose bleeds or serious fall or head trauma.  Discussed signs and symptoms of stroke. The patient should avoid any OTC items containing aspirin or ibuprofen.  Avoid alcohol consumption.   Call if any signs of abnormal bleeding.  Discussed financial obligations and resolved any difficulty in obtaining medication.  Next lab test test in 6 months.

## 2013-11-27 LAB — MDC_IDC_ENUM_SESS_TYPE_REMOTE

## 2013-11-30 NOTE — Progress Notes (Signed)
Loop recorder 

## 2013-12-06 ENCOUNTER — Encounter: Payer: Self-pay | Admitting: Internal Medicine

## 2013-12-18 ENCOUNTER — Ambulatory Visit (INDEPENDENT_AMBULATORY_CARE_PROVIDER_SITE_OTHER): Payer: Medicaid Other | Admitting: *Deleted

## 2013-12-18 DIAGNOSIS — I639 Cerebral infarction, unspecified: Secondary | ICD-10-CM

## 2013-12-21 LAB — MDC_IDC_ENUM_SESS_TYPE_REMOTE

## 2013-12-25 NOTE — Progress Notes (Signed)
Loop recorder 

## 2014-01-07 ENCOUNTER — Encounter: Payer: Self-pay | Admitting: Internal Medicine

## 2014-01-09 ENCOUNTER — Ambulatory Visit: Payer: Medicaid Other | Admitting: Neurology

## 2014-01-10 ENCOUNTER — Encounter (HOSPITAL_COMMUNITY): Payer: Self-pay | Admitting: Cardiovascular Disease

## 2014-01-11 ENCOUNTER — Ambulatory Visit: Payer: Medicaid Other | Admitting: Neurology

## 2014-01-17 ENCOUNTER — Ambulatory Visit (INDEPENDENT_AMBULATORY_CARE_PROVIDER_SITE_OTHER): Payer: Medicaid Other | Admitting: *Deleted

## 2014-01-17 DIAGNOSIS — I639 Cerebral infarction, unspecified: Secondary | ICD-10-CM

## 2014-01-18 LAB — MDC_IDC_ENUM_SESS_TYPE_REMOTE

## 2014-01-18 NOTE — Progress Notes (Signed)
Loop recorder 

## 2014-02-18 ENCOUNTER — Ambulatory Visit (INDEPENDENT_AMBULATORY_CARE_PROVIDER_SITE_OTHER): Payer: Medicare Other | Admitting: *Deleted

## 2014-02-18 DIAGNOSIS — I639 Cerebral infarction, unspecified: Secondary | ICD-10-CM

## 2014-02-18 LAB — MDC_IDC_ENUM_SESS_TYPE_REMOTE

## 2014-02-19 NOTE — Progress Notes (Signed)
Loop recorder 

## 2014-03-14 ENCOUNTER — Encounter: Payer: Self-pay | Admitting: Internal Medicine

## 2014-03-19 ENCOUNTER — Ambulatory Visit (INDEPENDENT_AMBULATORY_CARE_PROVIDER_SITE_OTHER): Payer: Medicare Other | Admitting: *Deleted

## 2014-03-19 DIAGNOSIS — I639 Cerebral infarction, unspecified: Secondary | ICD-10-CM

## 2014-03-20 NOTE — Progress Notes (Signed)
Loop recorder 

## 2014-03-25 LAB — MDC_IDC_ENUM_SESS_TYPE_REMOTE

## 2014-03-26 ENCOUNTER — Encounter: Payer: Self-pay | Admitting: Internal Medicine

## 2014-04-15 ENCOUNTER — Encounter: Payer: Self-pay | Admitting: Internal Medicine

## 2014-04-18 ENCOUNTER — Ambulatory Visit (INDEPENDENT_AMBULATORY_CARE_PROVIDER_SITE_OTHER): Payer: Medicare Other | Admitting: *Deleted

## 2014-04-18 DIAGNOSIS — I639 Cerebral infarction, unspecified: Secondary | ICD-10-CM | POA: Diagnosis not present

## 2014-04-19 NOTE — Progress Notes (Signed)
Loop recorder 

## 2014-05-01 LAB — MDC_IDC_ENUM_SESS_TYPE_REMOTE

## 2014-05-10 ENCOUNTER — Encounter: Payer: Self-pay | Admitting: Internal Medicine

## 2014-05-17 ENCOUNTER — Ambulatory Visit (INDEPENDENT_AMBULATORY_CARE_PROVIDER_SITE_OTHER): Payer: Medicare Other | Admitting: *Deleted

## 2014-05-17 DIAGNOSIS — I639 Cerebral infarction, unspecified: Secondary | ICD-10-CM

## 2014-05-22 NOTE — Progress Notes (Signed)
Loop recorder 

## 2014-05-27 ENCOUNTER — Ambulatory Visit: Payer: Medicaid Other | Admitting: Neurology

## 2014-06-13 LAB — CUP PACEART REMOTE DEVICE CHECK: Date Time Interrogation Session: 20160512165537

## 2014-06-17 ENCOUNTER — Ambulatory Visit (INDEPENDENT_AMBULATORY_CARE_PROVIDER_SITE_OTHER): Payer: Medicare Other | Admitting: *Deleted

## 2014-06-17 DIAGNOSIS — I639 Cerebral infarction, unspecified: Secondary | ICD-10-CM

## 2014-06-17 LAB — CUP PACEART REMOTE DEVICE CHECK: MDC IDC SESS DTM: 20160601151905

## 2014-06-20 ENCOUNTER — Encounter: Payer: Self-pay | Admitting: Cardiology

## 2014-06-21 NOTE — Progress Notes (Signed)
Loop recorder 

## 2014-06-27 ENCOUNTER — Encounter: Payer: Self-pay | Admitting: Cardiology

## 2014-07-03 ENCOUNTER — Encounter: Payer: Self-pay | Admitting: Internal Medicine

## 2014-07-05 ENCOUNTER — Encounter: Payer: Self-pay | Admitting: Cardiology

## 2014-07-17 ENCOUNTER — Ambulatory Visit (INDEPENDENT_AMBULATORY_CARE_PROVIDER_SITE_OTHER): Payer: Medicare Other | Admitting: *Deleted

## 2014-07-17 DIAGNOSIS — I639 Cerebral infarction, unspecified: Secondary | ICD-10-CM | POA: Diagnosis not present

## 2014-07-17 NOTE — Progress Notes (Signed)
Loop recorder 

## 2014-07-18 ENCOUNTER — Encounter: Payer: Self-pay | Admitting: Internal Medicine

## 2014-07-25 LAB — CUP PACEART REMOTE DEVICE CHECK: Date Time Interrogation Session: 20160623153610

## 2014-08-07 ENCOUNTER — Encounter: Payer: Self-pay | Admitting: Internal Medicine

## 2014-08-16 ENCOUNTER — Ambulatory Visit (INDEPENDENT_AMBULATORY_CARE_PROVIDER_SITE_OTHER): Payer: Medicare Other | Admitting: *Deleted

## 2014-08-16 DIAGNOSIS — I639 Cerebral infarction, unspecified: Secondary | ICD-10-CM | POA: Diagnosis not present

## 2014-08-19 ENCOUNTER — Ambulatory Visit: Payer: Medicare Other | Admitting: Neurology

## 2014-08-20 ENCOUNTER — Encounter: Payer: Self-pay | Admitting: Neurology

## 2014-08-20 NOTE — Progress Notes (Signed)
Loop recorder 

## 2014-08-23 LAB — CUP PACEART REMOTE DEVICE CHECK: MDC IDC SESS DTM: 20160722131543

## 2014-08-24 IMAGING — CR DG CHEST 2V
2 series · 2 of 2 positions shown · non-contrast
Comparison: None.

CLINICAL DATA: Pre CABG.

CHEST - 2 VIEW

[w chest pa]
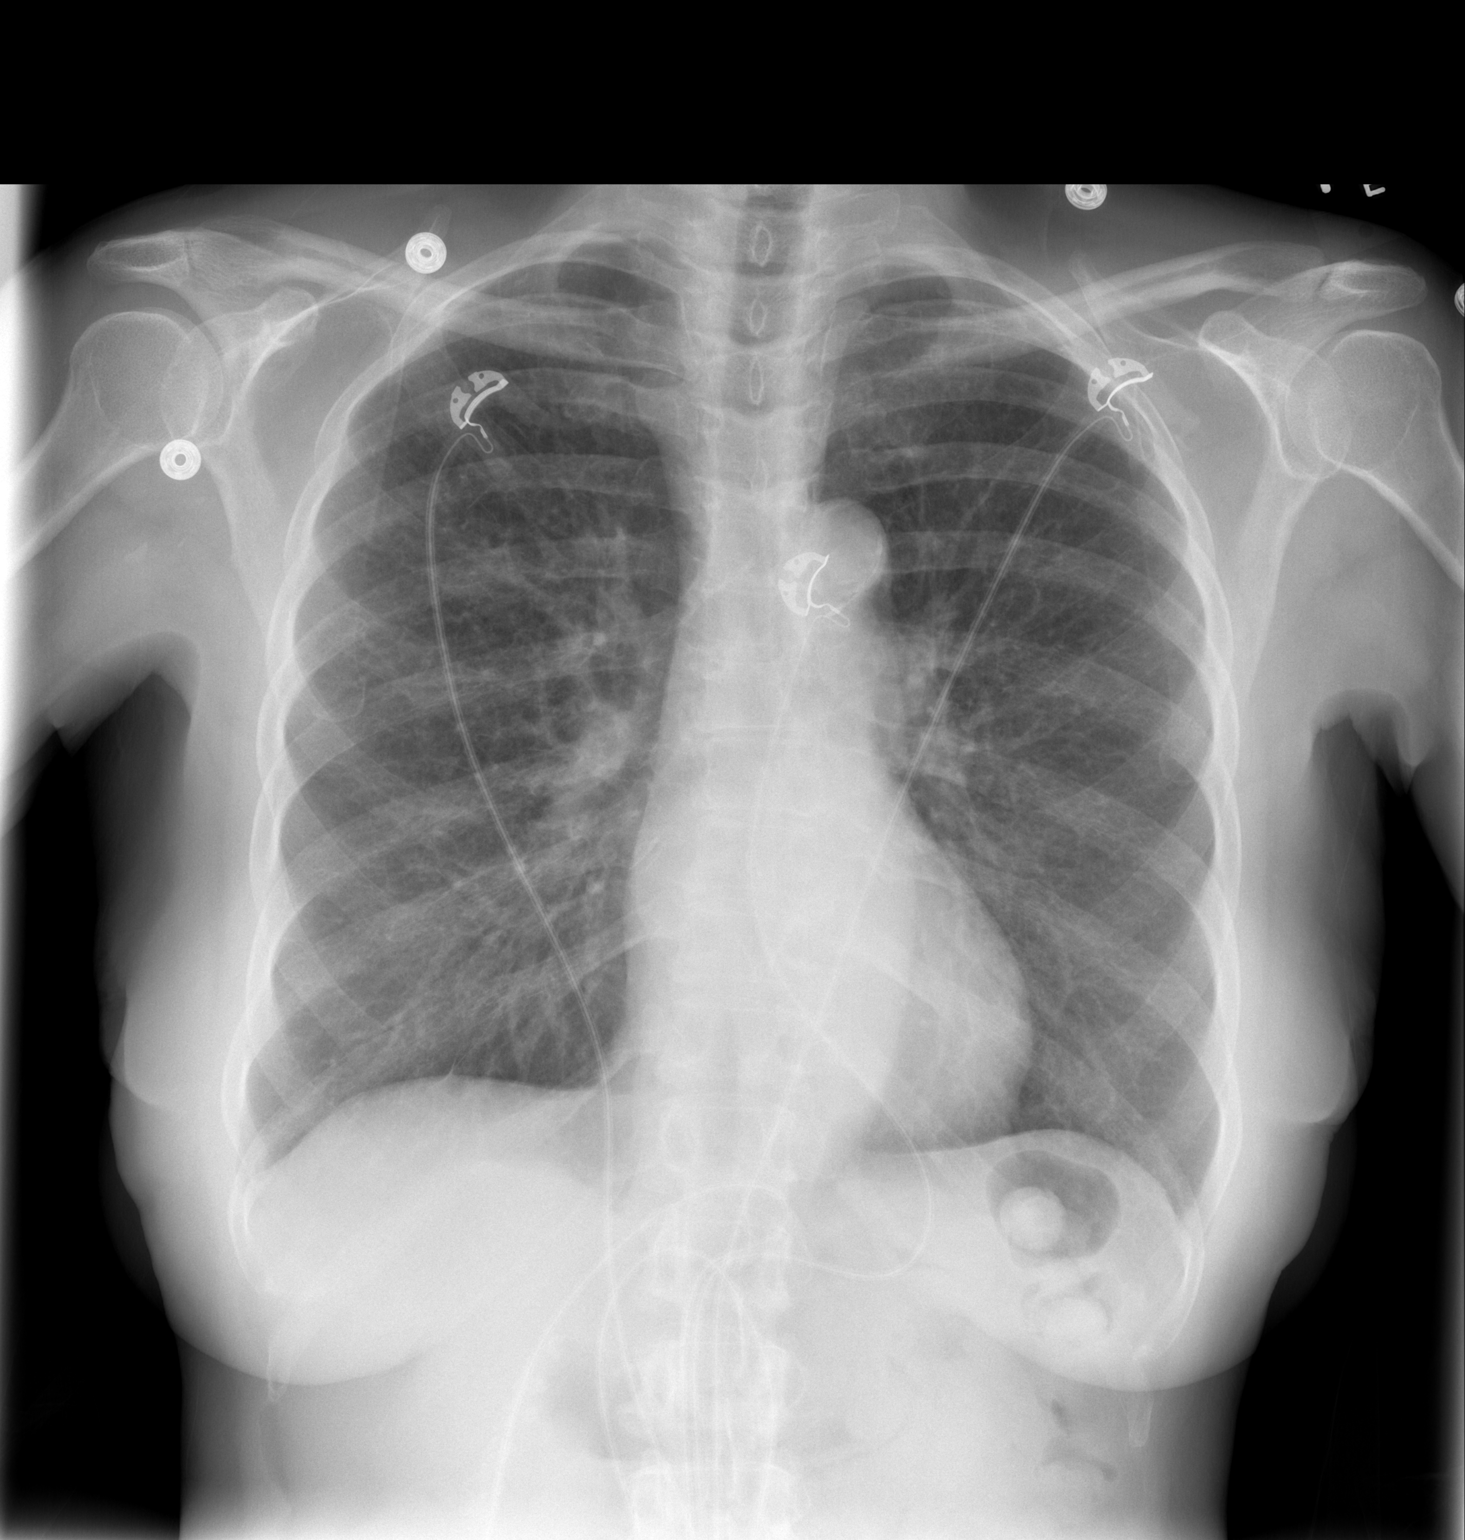

[w chest lat]
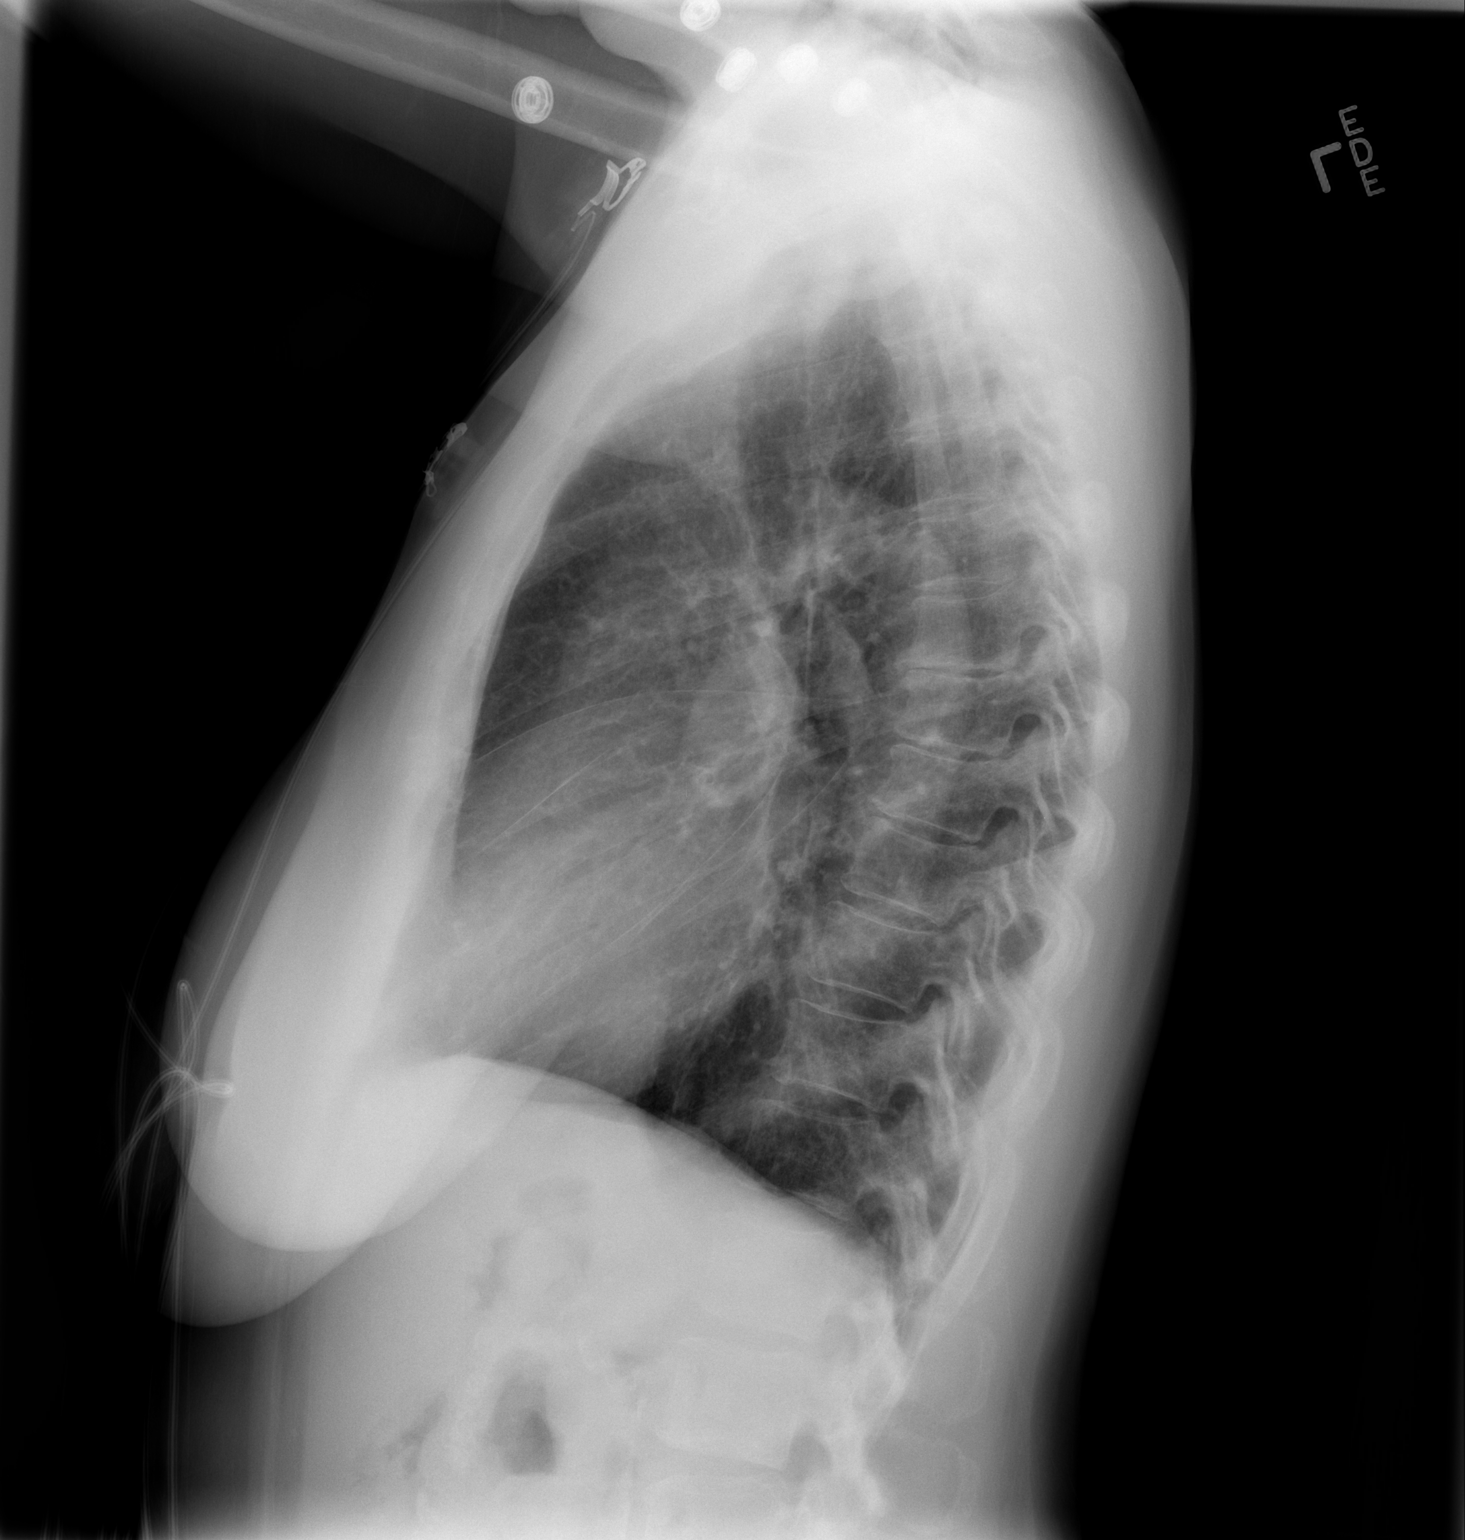

[2 of 2 positions shown; findings below may reference images not displayed]

FINDINGS: Trachea is midline.  Heart size normal.  Lungs appear
mildly hyperinflated but otherwise clear.  No pleural fluid.
IMPRESSION: No acute findings.

## 2014-09-10 ENCOUNTER — Encounter: Payer: Self-pay | Admitting: Internal Medicine

## 2014-09-16 ENCOUNTER — Ambulatory Visit (INDEPENDENT_AMBULATORY_CARE_PROVIDER_SITE_OTHER): Payer: Medicare Other | Admitting: *Deleted

## 2014-09-16 DIAGNOSIS — I639 Cerebral infarction, unspecified: Secondary | ICD-10-CM | POA: Diagnosis not present

## 2014-09-17 NOTE — Progress Notes (Signed)
Loop recorder 

## 2014-09-23 ENCOUNTER — Encounter: Payer: Self-pay | Admitting: Internal Medicine

## 2014-10-15 ENCOUNTER — Ambulatory Visit (INDEPENDENT_AMBULATORY_CARE_PROVIDER_SITE_OTHER): Payer: Medicare Other | Admitting: *Deleted

## 2014-10-15 DIAGNOSIS — I639 Cerebral infarction, unspecified: Secondary | ICD-10-CM | POA: Diagnosis not present

## 2014-10-15 NOTE — Progress Notes (Signed)
Loop recorder 

## 2014-10-28 LAB — CUP PACEART REMOTE DEVICE CHECK: MDC IDC SESS DTM: 20160814110744

## 2014-10-28 NOTE — Progress Notes (Signed)
Carelink summary report received. Battery status OK. Normal device function. No new symptom, tachy episodes, brady, or AF episodes. 6 pause episodes--all undersensing. Will make Device Clinic appointment to reprogram for undersensing. Monthly summary reports and ROV with JA PRN.

## 2014-11-07 ENCOUNTER — Encounter: Payer: Self-pay | Admitting: Internal Medicine

## 2014-11-09 LAB — CUP PACEART REMOTE DEVICE CHECK: Date Time Interrogation Session: 20160913113539

## 2014-11-09 NOTE — Progress Notes (Signed)
Carelink summary report received. Battery status OK. Normal device function. No new symptom episodes or tachy episodes. 1 brady & 6 pause episodes---EGMs show undersensing. No new AF episodes. Monthly summary reports and ROV w/ device clinic 11/07/14 to reprogram detections & w/ JA PRN.

## 2014-11-12 ENCOUNTER — Encounter: Payer: Self-pay | Admitting: Internal Medicine

## 2014-11-14 ENCOUNTER — Ambulatory Visit (INDEPENDENT_AMBULATORY_CARE_PROVIDER_SITE_OTHER): Payer: Medicare Other | Admitting: *Deleted

## 2014-11-14 DIAGNOSIS — I639 Cerebral infarction, unspecified: Secondary | ICD-10-CM

## 2014-11-14 NOTE — Progress Notes (Signed)
Loop recorder 

## 2014-11-18 LAB — CUP PACEART REMOTE DEVICE CHECK: MDC IDC SESS DTM: 20161013120522

## 2014-11-18 NOTE — Progress Notes (Signed)
Carelink summary report received. Battery status OK. Normal device function. No new symptom, tachy, brady, or AF episodes. 5 pause episodes--all undersensing. When able to reach patient, will schedule ROV with Dr. Allyson Sabal and schedule Med[employer] rep to turn off pause and brady detection per JA. Monthly summary reports and ROV with JA PRN.

## 2014-11-19 ENCOUNTER — Encounter: Payer: Self-pay | Admitting: Internal Medicine

## 2014-11-21 ENCOUNTER — Encounter: Payer: Self-pay | Admitting: Cardiology

## 2014-11-28 ENCOUNTER — Encounter: Payer: Self-pay | Admitting: Cardiology

## 2014-12-06 ENCOUNTER — Encounter: Payer: Self-pay | Admitting: Cardiology

## 2014-12-16 ENCOUNTER — Encounter: Payer: Medicare Other | Admitting: *Deleted

## 2015-01-02 ENCOUNTER — Encounter: Payer: Self-pay | Admitting: Internal Medicine

## 2015-01-03 ENCOUNTER — Ambulatory Visit: Payer: Medicare Other | Admitting: Cardiovascular Disease

## 2015-01-03 DIAGNOSIS — R0989 Other specified symptoms and signs involving the circulatory and respiratory systems: Secondary | ICD-10-CM

## 2015-01-15 ENCOUNTER — Encounter: Payer: Self-pay | Admitting: Cardiovascular Disease

## 2016-11-15 NOTE — Telephone Encounter (Signed)
Closing Encounter.

## 2017-03-10 ENCOUNTER — Emergency Department (HOSPITAL_COMMUNITY): Payer: Medicare Other

## 2017-03-10 ENCOUNTER — Encounter (HOSPITAL_COMMUNITY): Payer: Self-pay | Admitting: Emergency Medicine

## 2017-03-10 DIAGNOSIS — W19XXXA Unspecified fall, initial encounter: Secondary | ICD-10-CM | POA: Diagnosis present

## 2017-03-10 DIAGNOSIS — S62616A Displaced fracture of proximal phalanx of right little finger, initial encounter for closed fracture: Secondary | ICD-10-CM | POA: Diagnosis present

## 2017-03-10 DIAGNOSIS — I63512 Cerebral infarction due to unspecified occlusion or stenosis of left middle cerebral artery: Principal | ICD-10-CM | POA: Diagnosis present

## 2017-03-10 DIAGNOSIS — I252 Old myocardial infarction: Secondary | ICD-10-CM

## 2017-03-10 DIAGNOSIS — D62 Acute posthemorrhagic anemia: Secondary | ICD-10-CM | POA: Diagnosis present

## 2017-03-10 DIAGNOSIS — I4510 Unspecified right bundle-branch block: Secondary | ICD-10-CM | POA: Diagnosis present

## 2017-03-10 DIAGNOSIS — T45516A Underdosing of anticoagulants, initial encounter: Secondary | ICD-10-CM | POA: Diagnosis present

## 2017-03-10 DIAGNOSIS — I63539 Cerebral infarction due to unspecified occlusion or stenosis of unspecified posterior cerebral artery: Secondary | ICD-10-CM | POA: Diagnosis present

## 2017-03-10 DIAGNOSIS — R29711 NIHSS score 11: Secondary | ICD-10-CM | POA: Diagnosis not present

## 2017-03-10 DIAGNOSIS — Z951 Presence of aortocoronary bypass graft: Secondary | ICD-10-CM

## 2017-03-10 DIAGNOSIS — Z79899 Other long term (current) drug therapy: Secondary | ICD-10-CM

## 2017-03-10 DIAGNOSIS — Z95818 Presence of other cardiac implants and grafts: Secondary | ICD-10-CM

## 2017-03-10 DIAGNOSIS — I639 Cerebral infarction, unspecified: Secondary | ICD-10-CM | POA: Diagnosis not present

## 2017-03-10 DIAGNOSIS — I2510 Atherosclerotic heart disease of native coronary artery without angina pectoris: Secondary | ICD-10-CM | POA: Diagnosis present

## 2017-03-10 DIAGNOSIS — I69320 Aphasia following cerebral infarction: Secondary | ICD-10-CM

## 2017-03-10 DIAGNOSIS — F17210 Nicotine dependence, cigarettes, uncomplicated: Secondary | ICD-10-CM | POA: Diagnosis present

## 2017-03-10 DIAGNOSIS — I482 Chronic atrial fibrillation: Secondary | ICD-10-CM | POA: Diagnosis present

## 2017-03-10 DIAGNOSIS — R29703 NIHSS score 3: Secondary | ICD-10-CM | POA: Diagnosis present

## 2017-03-10 DIAGNOSIS — R4781 Slurred speech: Secondary | ICD-10-CM | POA: Diagnosis present

## 2017-03-10 DIAGNOSIS — I480 Paroxysmal atrial fibrillation: Secondary | ICD-10-CM | POA: Diagnosis present

## 2017-03-10 DIAGNOSIS — R29810 Facial weakness: Secondary | ICD-10-CM | POA: Diagnosis present

## 2017-03-10 DIAGNOSIS — E876 Hypokalemia: Secondary | ICD-10-CM | POA: Diagnosis present

## 2017-03-10 DIAGNOSIS — I10 Essential (primary) hypertension: Secondary | ICD-10-CM | POA: Diagnosis present

## 2017-03-10 DIAGNOSIS — Z7901 Long term (current) use of anticoagulants: Secondary | ICD-10-CM

## 2017-03-10 DIAGNOSIS — E785 Hyperlipidemia, unspecified: Secondary | ICD-10-CM | POA: Diagnosis present

## 2017-03-10 DIAGNOSIS — I69351 Hemiplegia and hemiparesis following cerebral infarction affecting right dominant side: Secondary | ICD-10-CM

## 2017-03-10 DIAGNOSIS — I6521 Occlusion and stenosis of right carotid artery: Secondary | ICD-10-CM | POA: Diagnosis present

## 2017-03-10 DIAGNOSIS — Z9119 Patient's noncompliance with other medical treatment and regimen: Secondary | ICD-10-CM

## 2017-03-10 DIAGNOSIS — R1310 Dysphagia, unspecified: Secondary | ICD-10-CM | POA: Diagnosis present

## 2017-03-10 DIAGNOSIS — R402413 Glasgow coma scale score 13-15, at hospital admission: Secondary | ICD-10-CM | POA: Diagnosis present

## 2017-03-10 LAB — COMPREHENSIVE METABOLIC PANEL WITH GFR
ALT: 24 U/L (ref 14–54)
AST: 32 U/L (ref 15–41)
Albumin: 4 g/dL (ref 3.5–5.0)
Alkaline Phosphatase: 71 U/L (ref 38–126)
Anion gap: 13 (ref 5–15)
BUN: 10 mg/dL (ref 6–20)
CO2: 23 mmol/L (ref 22–32)
Calcium: 9.5 mg/dL (ref 8.9–10.3)
Chloride: 106 mmol/L (ref 101–111)
Creatinine, Ser: 0.85 mg/dL (ref 0.44–1.00)
GFR calc Af Amer: >60 mL/min
GFR calc non Af Amer: >60 mL/min
Glucose, Bld: 85 mg/dL (ref 65–99)
Potassium: 3.4 mmol/L — ABNORMAL LOW (ref 3.5–5.1)
Sodium: 142 mmol/L (ref 135–145)
Total Bilirubin: 0.5 mg/dL (ref 0.3–1.2)
Total Protein: 7.1 g/dL (ref 6.5–8.1)

## 2017-03-10 LAB — ETHANOL: Alcohol, Ethyl (B): <10 mg/dL (ref <10)

## 2017-03-10 LAB — CBC WITH DIFFERENTIAL/PLATELET
Basophils Absolute: 0.1 K/uL (ref 0.0–0.1)
Basophils Relative: 1 %
Eosinophils Absolute: 0.1 K/uL (ref 0.0–0.7)
Eosinophils Relative: 2 %
HCT: 40.1 % (ref 36.0–46.0)
Hemoglobin: 13.5 g/dL (ref 12.0–15.0)
Lymphocytes Relative: 45 %
Lymphs Abs: 2.8 K/uL (ref 0.7–4.0)
MCH: 31.5 pg (ref 26.0–34.0)
MCHC: 33.7 g/dL (ref 30.0–36.0)
MCV: 93.5 fL (ref 78.0–100.0)
Monocytes Absolute: 0.4 K/uL (ref 0.1–1.0)
Monocytes Relative: 7 %
Neutro Abs: 2.8 K/uL (ref 1.7–7.7)
Neutrophils Relative %: 45 %
Platelets: 249 K/uL (ref 150–400)
RBC: 4.29 MIL/uL (ref 3.87–5.11)
RDW: 14.1 % (ref 11.5–15.5)
WBC: 6.1 K/uL (ref 4.0–10.5)

## 2017-03-10 LAB — PROTIME-INR
INR: 0.95
Prothrombin Time: 12.6 seconds (ref 11.4–15.2)

## 2017-03-10 NOTE — ED Triage Notes (Signed)
Pt presents with garbled speech, confusion, and right hand abnormality (swelling). LSW at unknown time last night. Per pt family/friend she refused to come to the hospital last night pt and family are poor historians. Hx of stroke with Right sided deficits.

## 2017-03-10 NOTE — ED Provider Notes (Signed)
Patient placed in Quick Look pathway, seen and evaluated   Chief Complaint: Aphasia  HPI:   67 y.o. history of hyperlipidemia, CVA, A. fib who presents for evaluation of aphasia, confusion, right hand pain and swelling.  Last known normal was last night.  Patient lives with her husband and friend reports that has been the last night and she was at her normal state of health.  Her friend who is with her in the emergency department today states that she went to go pick3 her up an hour ago and noticed that her face was drooping, she was having difficulty speaking and she was having pain and swelling to the right hand.  Patient does have a history of right-sided deficits secondary to previous CVA.  She does not recall any fall.  ROS: Aphasia (one)  Physical Exam:   Gen: No distress  Neuro: Awake and Alert. Slurred speech. Facial droop noted.  Diminished strength noticed to right upper and right lower extremity unclear if this is new or if this is previous deficit from previous CVA.  Skin: Warm CV: 2+ radial pulses bilateral  Skin: Good distal cap refill.     Focused Exam: Tenderness palpation overlying the fourth and fifth metacarpals of the right hand.  There is also diffuse soft tissue swelling and ecchymosis.   Initiation of care has begun. The patient has been counseled on the process, plan, and necessity for staying for the completion/evaluation, and the remainder of the medical screening examination  Discussed patient with Dr. Elon Spanner (Neuro).  Recommends obtaining an MRI for further evaluation and rule out stroke.  If MRI is negative and there is no other finding that would explain patient's symptoms, recommends admission.  MRI is negative and patient has other workup that shows symptoms, including UTI, question if admission would be needed or whether or not patient can receive outpatient treatment.  He would like to be notified after MRI is returned.  Given that the patient was last seen  normal greater than 24 hours ago, patient is not a candidate for TPA or endovascular treatment.  Patient signed out to Ivar Drape, PA-C with MRI pending.     Maxwell Caul, PA-C 03/11/17 0326    Nira Conn, MD 03/12/17 1121

## 2017-03-11 ENCOUNTER — Inpatient Hospital Stay (HOSPITAL_COMMUNITY)
Admission: EM | Admit: 2017-03-11 | Discharge: 2017-03-14 | DRG: 065 | Disposition: A | Discharge status: Rehabilitation Facility | Payer: Medicare Other | Attending: Family Medicine | Admitting: Family Medicine

## 2017-03-11 ENCOUNTER — Other Ambulatory Visit: Payer: Self-pay

## 2017-03-11 ENCOUNTER — Observation Stay (HOSPITAL_COMMUNITY): Payer: Medicare Other

## 2017-03-11 ENCOUNTER — Emergency Department (HOSPITAL_COMMUNITY): Payer: Medicare Other

## 2017-03-11 ENCOUNTER — Other Ambulatory Visit (HOSPITAL_COMMUNITY): Payer: Medicare Other

## 2017-03-11 ENCOUNTER — Encounter (HOSPITAL_COMMUNITY): Payer: Self-pay | Admitting: Family Medicine

## 2017-03-11 DIAGNOSIS — S62617A Displaced fracture of proximal phalanx of left little finger, initial encounter for closed fracture: Secondary | ICD-10-CM | POA: Diagnosis present

## 2017-03-11 DIAGNOSIS — I69322 Dysarthria following cerebral infarction: Secondary | ICD-10-CM

## 2017-03-11 DIAGNOSIS — I2510 Atherosclerotic heart disease of native coronary artery without angina pectoris: Secondary | ICD-10-CM | POA: Diagnosis not present

## 2017-03-11 DIAGNOSIS — S62616A Displaced fracture of proximal phalanx of right little finger, initial encounter for closed fracture: Secondary | ICD-10-CM

## 2017-03-11 DIAGNOSIS — E876 Hypokalemia: Secondary | ICD-10-CM

## 2017-03-11 DIAGNOSIS — I63512 Cerebral infarction due to unspecified occlusion or stenosis of left middle cerebral artery: Secondary | ICD-10-CM | POA: Diagnosis not present

## 2017-03-11 DIAGNOSIS — I10 Essential (primary) hypertension: Secondary | ICD-10-CM | POA: Diagnosis not present

## 2017-03-11 DIAGNOSIS — I480 Paroxysmal atrial fibrillation: Secondary | ICD-10-CM | POA: Diagnosis not present

## 2017-03-11 DIAGNOSIS — Z8673 Personal history of transient ischemic attack (TIA), and cerebral infarction without residual deficits: Secondary | ICD-10-CM

## 2017-03-11 DIAGNOSIS — I6930 Unspecified sequelae of cerebral infarction: Secondary | ICD-10-CM

## 2017-03-11 DIAGNOSIS — I252 Old myocardial infarction: Secondary | ICD-10-CM

## 2017-03-11 DIAGNOSIS — I4891 Unspecified atrial fibrillation: Secondary | ICD-10-CM | POA: Diagnosis present

## 2017-03-11 DIAGNOSIS — Z87898 Personal history of other specified conditions: Secondary | ICD-10-CM

## 2017-03-11 DIAGNOSIS — D62 Acute posthemorrhagic anemia: Secondary | ICD-10-CM

## 2017-03-11 DIAGNOSIS — I2583 Coronary atherosclerosis due to lipid rich plaque: Secondary | ICD-10-CM | POA: Diagnosis not present

## 2017-03-11 DIAGNOSIS — E785 Hyperlipidemia, unspecified: Secondary | ICD-10-CM

## 2017-03-11 DIAGNOSIS — I63412 Cerebral infarction due to embolism of left middle cerebral artery: Secondary | ICD-10-CM

## 2017-03-11 DIAGNOSIS — R4701 Aphasia: Secondary | ICD-10-CM

## 2017-03-11 DIAGNOSIS — I639 Cerebral infarction, unspecified: Secondary | ICD-10-CM

## 2017-03-11 HISTORY — DX: Unspecified convulsions: R56.9

## 2017-03-11 HISTORY — DX: Cerebral infarction due to unspecified occlusion or stenosis of left middle cerebral artery: I63.512

## 2017-03-11 LAB — BASIC METABOLIC PANEL
Anion gap: 12 (ref 5–15)
BUN: 9 mg/dL (ref 6–20)
CHLORIDE: 106 mmol/L (ref 101–111)
CO2: 23 mmol/L (ref 22–32)
CREATININE: 0.79 mg/dL (ref 0.44–1.00)
Calcium: 9.2 mg/dL (ref 8.9–10.3)
GFR calc Af Amer: >60 mL/min (ref >60)
GFR calc non Af Amer: >60 mL/min (ref >60)
Glucose, Bld: 82 mg/dL (ref 65–99)
Potassium: 3.3 mmol/L — ABNORMAL LOW (ref 3.5–5.1)
SODIUM: 141 mmol/L (ref 135–145)

## 2017-03-11 LAB — LIPID PANEL
CHOLESTEROL: 99 mg/dL (ref 0–200)
HDL: 36 mg/dL — ABNORMAL LOW (ref >40)
LDL Cholesterol: 53 mg/dL (ref 0–99)
Total CHOL/HDL Ratio: 2.8 RATIO
Triglycerides: 48 mg/dL (ref <150)
VLDL: 10 mg/dL (ref 0–40)

## 2017-03-11 LAB — CBC
HCT: 35.9 % — ABNORMAL LOW (ref 36.0–46.0)
Hemoglobin: 11.9 g/dL — ABNORMAL LOW (ref 12.0–15.0)
MCH: 30.7 pg (ref 26.0–34.0)
MCHC: 33.1 g/dL (ref 30.0–36.0)
MCV: 92.8 fL (ref 78.0–100.0)
PLATELETS: 230 10*3/uL (ref 150–400)
RBC: 3.87 MIL/uL (ref 3.87–5.11)
RDW: 13.8 % (ref 11.5–15.5)
WBC: 6 10*3/uL (ref 4.0–10.5)

## 2017-03-11 LAB — HEMOGLOBIN A1C
HEMOGLOBIN A1C: 5.6 % (ref 4.8–5.6)
Mean Plasma Glucose: 114.02 mg/dL

## 2017-03-11 LAB — MAGNESIUM: Magnesium: 1.6 mg/dL — ABNORMAL LOW (ref 1.7–2.4)

## 2017-03-11 MED ORDER — POTASSIUM CHLORIDE IN NACL 40-0.9 MEQ/L-% IV SOLN
INTRAVENOUS | Status: AC
  Administered 2017-03-11: 100 mL/h via INTRAVENOUS
  Filled 2017-03-11 (×2): qty 1000

## 2017-03-11 MED ORDER — ACETAMINOPHEN 325 MG PO TABS: 650.0000 mg | ORAL_TABLET | ORAL | Status: DC | PRN

## 2017-03-11 MED ORDER — ACETAMINOPHEN 650 MG RE SUPP: 650.0000 mg | RECTAL | Status: DC | PRN

## 2017-03-11 MED ORDER — ASPIRIN 325 MG PO TABS: 325.0000 mg | ORAL_TABLET | Freq: Every day | ORAL | Status: DC

## 2017-03-11 MED ORDER — DONEPEZIL HCL 5 MG PO TABS
5.0000 mg | ORAL_TABLET | Freq: Every day | ORAL | Status: DC
  Administered 2017-03-12 – 2017-03-13 (×2): 5 mg via ORAL
  Filled 2017-03-11 (×3): qty 1

## 2017-03-11 MED ORDER — ATORVASTATIN CALCIUM 10 MG PO TABS
10.0000 mg | ORAL_TABLET | Freq: Every day | ORAL | Status: DC
  Administered 2017-03-12 – 2017-03-13 (×2): 10 mg via ORAL
  Filled 2017-03-11 (×4): qty 1

## 2017-03-11 MED ORDER — ATORVASTATIN CALCIUM 80 MG PO TABS: 80.0000 mg | ORAL_TABLET | Freq: Every day | ORAL | Status: DC

## 2017-03-11 MED ORDER — SENNOSIDES-DOCUSATE SODIUM 8.6-50 MG PO TABS: 1.0000 | ORAL_TABLET | Freq: Every evening | ORAL | Status: DC | PRN

## 2017-03-11 MED ORDER — APIXABAN 5 MG PO TABS
5.0000 mg | ORAL_TABLET | Freq: Two times a day (BID) | ORAL | Status: DC
  Administered 2017-03-12 – 2017-03-14 (×5): 5 mg via ORAL
  Filled 2017-03-11 (×8): qty 1

## 2017-03-11 MED ORDER — ACETAMINOPHEN 160 MG/5ML PO SOLN: 650.0000 mg | ORAL | Status: DC | PRN

## 2017-03-11 MED ORDER — STROKE: EARLY STAGES OF RECOVERY BOOK
Freq: Once | Status: AC
  Administered 2017-03-11: 05:00:00
  Filled 2017-03-11: qty 1

## 2017-03-11 MED ORDER — ASPIRIN 300 MG RE SUPP: 300.0000 mg | Freq: Every day | RECTAL | Status: DC

## 2017-03-11 MED ORDER — IOPAMIDOL (ISOVUE-370) INJECTION 76%
INTRAVENOUS | Status: AC
  Administered 2017-03-11: 50 mL
  Filled 2017-03-11: qty 50

## 2017-03-11 MED ORDER — POTASSIUM CHLORIDE CRYS ER 20 MEQ PO TBCR
20.0000 meq | EXTENDED_RELEASE_TABLET | Freq: Every day | ORAL | Status: DC
  Administered 2017-03-12: 20 meq via ORAL
  Filled 2017-03-11 (×2): qty 1

## 2017-03-11 MED ORDER — ENOXAPARIN SODIUM 40 MG/0.4ML ~~LOC~~ SOLN: 40.0000 mg | SUBCUTANEOUS | Status: DC

## 2017-03-11 MED ORDER — VITAMIN D 1000 UNITS PO TABS
1000.0000 [IU] | ORAL_TABLET | Freq: Every day | ORAL | Status: DC
  Administered 2017-03-12 – 2017-03-14 (×3): 1000 [IU] via ORAL
  Filled 2017-03-11 (×3): qty 1

## 2017-03-11 MED ORDER — LIDOCAINE HCL (PF) 1 % IJ SOLN
5.0000 mL | Freq: Once | INTRAMUSCULAR | Status: AC
  Administered 2017-03-11: 5 mL via INTRADERMAL
  Filled 2017-03-11: qty 5

## 2017-03-11 NOTE — Progress Notes (Signed)
[facility]  Patient admitted after midnight see H&P for further detail Chart reviewed, patient seen and examined.  Patient is a 67 year old female with past medical history significant for prior CVA, CAD status post CABG, hypertension, PAF on Eliquis who presented to the emergency department complaining of slurred speech and facial droop.  Upon ED evaluation MRI of the brain revealed patchy  acute ischemic nonhemorrhagic left MCA infarct.  Patient was admitted for neurological evaluation.  Patient reported feeling okay, she remains very dysarthric and have difficulty with swallowing.  No other complaints.  Will await for neurological evaluation, echo and carotid Doppler pending.  Eliquis held by neurology, will continue ASA.   Rest per H&P  Latrelle Dodrill, MD

## 2017-03-11 NOTE — ED Notes (Signed)
Pt sleeping at present

## 2017-03-11 NOTE — ED Provider Notes (Signed)
Patient presented to the ER with   Face to face Exam: HEENT - PERRLA Lungs - CTAB Heart - RRR, no M/R/G Abd - S/NT/ND Neuro - alert, slurred speech with expressive aphasia  Plan: MRI positive for stroke.  Patient will be admitted for further stroke workup.  Patient also has fracture of right fifth digit, will perform closed reduction and splint.   Gilda Crease, MD 03/11/17 [phone]

## 2017-03-11 NOTE — Consult Note (Signed)
Neurology Consultation  Reason for Consult: Worsening aphasia, right-sided weakness Referring Physician: Dr. Bebe Shaggy  CC: Worsening speech  History is obtained from: Patient's brother, chart  HPI: [name] [name] is a 67 y.o. female with a past medical history of atrial fibrillation on Eliquis, hypertension, hyperlipidemia, CABG, stroke in the past affecting the left MCA territory with residual mild aphasia and right-sided weakness and sensory loss, tobacco abuse who presents to the emergency room for evaluation of right facial drooping, difficulty talking and pain in her right hand.  Patient was unable to provide complete history.  Her brother was at bedside at the time of this encounter.  He said that the family or friends had noticed that she was not talking normally.  Since her last stroke, she has had speech deficits but was able to communicate with nearly complete sentences.  But since night of 03/09/2017, she has not been able to talk normally.  She was also noted to have a deformed and swollen right hand and the presumption is that she had a fall and probably hurt her hand but she denies any history of falls. Patient was evaluated in the emergency room by the ED providers.  An MRI was obtained given the history of stroke.  The MRI showed the remote moderate to large sized left posterior MCA and PCA territory infarct but also showed patchy acute nonhemorrhagic strokes in the left MCA territory, very cortical involving the frontal and parietal lobes. A neurological consultation was requested for evaluation of the strokes and further workup recommendations. According to the patient's family member at the bedside, no clear-cut explanation was found for her strokes in 2014 when she had a large stroke. Patient had an implantable loop recorder placed in 2014 and atrial fibrillation was detected at some point in 2015 from the implantable loop recorder she was started on Eliquis. According to the  patient, she says that she is on aspirin but could not confirm that she is taking aspirin or Eliquis. Family member at the bedside could not confirm the medication as well. Further information about anticoagulation will need to be obtained once other family members are at bedside in the morning.  LKW: Greater than 24 hours prior to arrival tpa given?: no, outside the window Premorbid modified Rankin scale (mRS):4  ROS: Unable to obtain due to altered mental status.   Past Medical History:  Diagnosis Date  . Hyperlipidemia LDL goal < 70 07/24/2012  . Hypertension   . 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 07/25/12 07/28/2012   POSTOPERATIVE DIAGNOSIS: Severe 3-vessel coronary disease, status post  myocardial infarction.  PROCEDURE: Median sternotomy, extracorporeal circulation, coronary  artery bypass grafting x5 (left internal mammary artery to LAD,  sequential saphenous vein graft to obtuse marginal 1, obtuse marginal 2,  and posterior descending with Y graft to 1st diagonal), endoscopic vein  harvest, left thigh.    Marland Kitchen STEMI (ST elevation myocardial infarction), secondary to disease of RCA, with disease of LAD and diag. branch and chronic non dominant LCX occlusion for CABG  07/21/2012  . Stroke (HCC)   . Tobacco abuse 07/21/2012    Family History  Problem Relation Age of Onset  . Hypertension Mother   . Stroke Brother        Died at age 61 secondary to MI  . CAD Brother     Social History:   reports that she has been smoking cigarettes.  She started smoking about 4 years ago. She has been smoking  about 0.25 packs per day. she has never used smokeless tobacco. She reports that she does not drink alcohol or use drugs.  Medications No current facility-administered medications for this encounter.   Current Outpatient Medications:  .  amLODipine (NORVASC) 10 MG tablet, Take 10 mg by mouth daily., Disp: , Rfl:  .  apixaban (ELIQUIS) 5 MG TABS tablet, Take 1 tablet (5 mg  total) by mouth 2 (two) times daily., Disp: 60 tablet, Rfl: 3 .  atorvastatin (LIPITOR) 80 MG tablet, Take 1 tablet (80 mg total) by mouth daily., Disp: 30 tablet, Rfl: 1 .  Cholecalciferol (VITAMIN D) 400 UNITS capsule, Take 800 Units by mouth daily., Disp: , Rfl:  .  cloNIDine (CATAPRES) 0.2 MG tablet, Take 1 tablet (0.2 mg total) by mouth 2 (two) times daily., Disp: 60 tablet, Rfl: 1 .  donepezil (ARICEPT) 5 MG tablet, Take 1 tablet (5 mg total) by mouth at bedtime., Disp: 30 tablet, Rfl: 5 .  lisinopril (PRINIVIL,ZESTRIL) 40 MG tablet, Take 40 mg by mouth daily., Disp: , Rfl:  .  potassium chloride SA (K-DUR,KLOR-CON) 20 MEQ tablet, Take 1 tablet (20 mEq total) by mouth daily., Disp: 90 tablet, Rfl: 3   Exam: Current vital signs: BP 135/80 (BP Location: Right Arm)   Pulse 82   Resp 16   Ht 5\' 4"  (1.626 m)   Wt 43.1 kg (95 lb)   SpO2 100%   BMI 16.31 kg/m  Vital signs in last 24 hours: Pulse Rate:  [82-83] 82 (02/08 0138) Resp:  [16] 16 (02/08 0138) BP: (135-160)/(80-83) 135/80 (02/08 0138) SpO2:  [100 %] 100 % (02/08 0138) Weight:  [43.1 kg (95 lb)] 43.1 kg (95 lb) (02/07 1928) General: Thin woman in no acute distress sitting comfortably in a wheelchair. HEENT: Normocephalic, atraumatic, moist oral mucous membranes, clear nares and throat. CVS: S1-S2 heard, regular rate rhythm. Respiratory: Chest clear to auscultation Extremities: No edema in both lower extremities.  Right hand examination showed swelling and tenderness to palpation on the fourth and fifth metacarpals on the right hand. Neurological exam Patient is awake, alert, oriented to self. She has moderate expressive aphasia. She was able to name a watch, but unable to name ring or thumb or knuckles. She was able to repeat a sentence with severe dysarthria. Her speech is severely dysarthric. Cranial nerves: Pupils equal round reactive to light, appears to not blink to threat from the right, blinks to threat from the  left, mild right nasolabial fold flattening, shoulder shrug intact, tongue midline. Motor exam: 4/5 right upper and lower extremity.  No vertical drift in the right upper extremity but there is vertical drift in the right lower extremity.  No drift on the left side with 5/5 strength. Sensory exam: Decreased sensation to light touch on the right. Coordination: Intact finger-nose-finger on the left.  Slow finger-nose-finger on the right. Gait examination was deferred at this time. NIHSS 1a Level of Conscious.: 0 1b LOC Questions: 2 1c LOC Commands: 0 2 Best Gaze: 0 3 Visual: 2 4 Facial Palsy: 1 5a Motor Arm - left: 0 5b Motor Arm - Right: 0 6a Motor Leg - Left: 0 6b Motor Leg - Right: 1 7 Limb Ataxia: 0 8 Sensory: 2 9 Best Language: 1 10 Dysarthria: 2 11 Extinct. and Inatten.: 0 TOTAL: 11  Labs I have reviewed labs in epic and the results pertinent to this consultation are:  CBC    Component Value Date/Time   WBC 6.1 03/10/2017 1932  RBC 4.29 03/10/2017 1932   HGB 13.5 03/10/2017 1932   HCT 40.1 03/10/2017 1932   PLT 249 03/10/2017 1932   MCV 93.5 03/10/2017 1932   MCH 31.5 03/10/2017 1932   MCHC 33.7 03/10/2017 1932   RDW 14.1 03/10/2017 1932   LYMPHSABS 2.8 03/10/2017 1932   MONOABS 0.4 03/10/2017 1932   EOSABS 0.1 03/10/2017 1932   BASOSABS 0.1 03/10/2017 1932    CMP     Component Value Date/Time   NA 142 03/10/2017 1932   K 3.4 (L) 03/10/2017 1932   CL 106 03/10/2017 1932   CO2 23 03/10/2017 1932   GLUCOSE 85 03/10/2017 1932   BUN 10 03/10/2017 1932   CREATININE 0.85 03/10/2017 1932   CALCIUM 9.5 03/10/2017 1932   PROT 7.1 03/10/2017 1932   ALBUMIN 4.0 03/10/2017 1932   AST 32 03/10/2017 1932   ALT 24 03/10/2017 1932   ALKPHOS 71 03/10/2017 1932   BILITOT 0.5 03/10/2017 1932   GFRNONAA >60 03/10/2017 1932   GFRAA >60 03/10/2017 1932    Imaging I have reviewed the images obtained:  MRI examination of the brain done today shows scattered areas of  cortical restricted diffusion in the left MCA territory.  Remote left MCA and left PCA infarct.  MRA head from 2014 also revealed severe disease of the distal left PCA with grossly patent basilar.  Assessment:  67 year old woman with a past medical history of coronary artery disease, hypertension, hyperlipidemia, prior left MCA and left PCA territory stroke with residual right hemiparesis and mild aphasia, atrial fibrillation on Eliquis, presented to the emergency room for evaluation of worsening aphasia and right-sided weakness. Last known normal was greater than 24 hours prior to arrival has a code stroke was not activated.  She was not a candidate for TPA or endovascular thrombectomy based on timing of last known normal. MRI was done in the emergency room that revealed the findings as documented above with a new scattered left MCA territory infarct. Likely acute ischemic stroke secondary to underlying atrial fibrillation.  Although she is anticoagulated with Eliquis, I could not confirm compliance. If she is compliant Eliquis and continues to have strokes on Eliquis, stroke team to discuss switching Eliquis to another DOAC or Coumadin  Impression: Acute ischemic stroke-likely cardioembolic involving the left MCA territory  Recommendations: -Admit to hospitalist -Telemetry monitoring -Allow for permissive hypertension for the first 24-48h - only treat PRN if SBP >220 mmHg. Blood pressures can be gradually normalized to SBP<140 upon discharge. -CT Angiogram of Head and neck -Echocardiogram -HgbA1c, fasting lipid panel -Frequent neuro checks -Prophylactic therapy-I would discontinue Eliquis for now and do ASA 325 mg po.  We would need to get more information from the family regarding compliance to Eliquis.  If she has been noncompliant and the strokes of occurred in the setting of noncompliance, she needs to be compliant to Eliquis.  If the strokes have happened with her being compliant to  Eliquis, stroke team to discuss alternative anticoagulant. -Atorvastatin 80 mg PO daily -Risk factor modification -I discussed the importance of exercise as well as smoking/alcohol/illicit drug use cessation. -PT consult, OT consult, Speech consult  -- Milon Dikes, MD [facility] Pager: [phone] If 7pm to 7am, please call on call as listed on AMION.  Please page stroke NP/PA/MD (listed on AMION)  from 8am-4 pm as this patient will be followed by the stroke team at this point.

## 2017-03-11 NOTE — Progress Notes (Signed)
ANTICOAGULATION CONSULT NOTE - Initial Consult  Pharmacy Consult for apixaban Indication: atrial fibrillation  No Known Allergies  Patient Measurements: Height: 5\' 4"  (162.6 cm) Weight: 95 lb (43.1 kg) IBW/kg (Calculated) : 54.7  Vital Signs: Temp: 97.9 F (36.6 C) (02/08 0318) Temp Source: Oral (02/08 0318) BP: 142/73 (02/08 0653) Pulse Rate: 73 (02/08 0653)  Labs: Recent Labs    03/10/17 1932 03/11/17 0447  HGB 13.5 11.9*  HCT 40.1 35.9*  PLT 249 230  LABPROT 12.6  --   INR 0.95  --   CREATININE 0.85 0.79    Estimated Creatinine Clearance: 45.8 mL/min (by C-G formula based on SCr of 0.79 mg/dL).   Medical History: Past Medical History:  Diagnosis Date  . Hyperlipidemia LDL goal < 70 07/24/2012  . Hypertension   . 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 07/25/12 07/28/2012   POSTOPERATIVE DIAGNOSIS: Severe 3-vessel coronary disease, status post  myocardial infarction.  PROCEDURE: Median sternotomy, extracorporeal circulation, coronary  artery bypass grafting x5 (left internal mammary artery to LAD,  sequential saphenous vein graft to obtuse marginal 1, obtuse marginal 2,  and posterior descending with Y graft to 1st diagonal), endoscopic vein  harvest, left thigh.    Marland Kitchen STEMI (ST elevation myocardial infarction), secondary to disease of RCA, with disease of LAD and diag. branch and chronic non dominant LCX occlusion for CABG  07/21/2012  . Stroke (HCC)   . Tobacco abuse 07/21/2012    Medications:  Scheduled:  . apixaban  5 mg Oral BID  . atorvastatin  80 mg Oral q1800  . cholecalciferol  1,000 Units Oral Daily  . donepezil  5 mg Oral QHS  . potassium chloride SA  20 mEq Oral Daily    Assessment: 69 yof with known history of atrial fibrillation on apixaban PTA. Hgb down slightly from 13.5 to 11.9, platelets stable. No signs/symptoms of bleeding. Scr is 0.79 (CrCl 45 mL/min).  On 5 mg twice daily PTA- dose appropriate given age<80 yrs and Scr<1.5,  despite weight<60 kg.   Goal of Therapy:  Monitor platelets by anticoagulation protocol: Yes   Plan:  Apixaban 5 mg twice daily Monitor CBC, Scr, and s/sx of bleeding  Girard Cooter, PharmD Clinical Pharmacist  Pager: [phone] Clinical Phone for 03/11/2017 until 3:30pm: [phone] If after 3:30pm, please call main pharmacy at [phone] 03/11/2017,10:46 AM

## 2017-03-11 NOTE — ED Notes (Signed)
Called pt to be reassessed with no response 2X

## 2017-03-11 NOTE — Progress Notes (Signed)
Inpatient Rehabilitation  Per PT request, patient was screened by [name] [name] for appropriateness for an Inpatient Acute Rehab consult.  At this time we are recommending an Inpatient Rehab consult.  Text paged MD to notify; please order if you are agreeable.    Roniya Tetro, M.A., CCC/SLP Admission Coordinator  [facility]  Cell [phone]  

## 2017-03-11 NOTE — Progress Notes (Signed)
NEUROHOSPITALISTS STROKE TEAM - DAILY PROGRESS NOTE   ADMISSION HISTORY: [name] [name] is a 67 y.o. female with a past medical history of atrial fibrillation on Eliquis, hypertension, hyperlipidemia, CABG, stroke in the past affecting the left MCA territory with residual mild aphasia and right-sided weakness and sensory loss, tobacco abuse who presents to the emergency room for evaluation of right facial drooping, difficulty talking and pain in her right hand.  Patient was unable to provide complete history.  Her brother was at bedside at the time of this encounter.  He said that the family or friends had noticed that she was not talking normally.  Since her last stroke, she has had speech deficits but was able to communicate with nearly complete sentences.  But since night of 03/09/2017, she has not been able to talk normally.  She was also noted to have a deformed and swollen right hand and the presumption is that she had a fall and probably hurt her hand but she denies any history of falls.    Patient was evaluated in the emergency room by the ED providers.  An MRI was obtained given the history of stroke.  The MRI showed the remote moderate to large sized left posterior MCA and PCA territory infarct but also showed patchy acute nonhemorrhagic strokes in the left MCA territory, very cortical involving the frontal and parietal lobes.  A neurological consultation was requested for evaluation of the strokes and further workup recommendations. According to the patient's family member at the bedside, no clear-cut explanation was found for her strokes in 2014 when she had a large stroke.    Patient had an implantable loop recorder placed in 2014 and atrial fibrillation was detected at some point in 2015 from the implantable loop recorder she was started on Eliquis. According to the patient, she says that she is on aspirin but could not confirm that she is  taking aspirin or Eliquis.    Family member at the bedside could not confirm the medication as well.    Further information about anticoagulation will need to be obtained once other family members are at bedside in the morning.  LKW: Greater than 24 hours prior to arrival tpa given?: no, outside the window Premorbid modified Rankin scale (mRS):4 NIHSS 1a Level of Conscious.: 0 1b LOC Questions: 2 1c LOC Commands: 0 2 Best Gaze: 0 3 Visual: 2 4 Facial Palsy: 1 5a Motor Arm - left: 0 5b Motor Arm - Right: 0 6a Motor Leg - Left: 0 6b Motor Leg - Right: 1 7 Limb Ataxia: 0 8 Sensory: 2 9 Best Language: 1 10 Dysarthria: 2 11 Extinct. and Inatten.: 0 TOTAL: 11  SUBJECTIVE (INTERVAL HISTORY) No family is at the bedside. Patient is found laying in bed in NAD. Overall she feels her condition is unchanged. Voices no new complaints. No new events reported overnight. Spoke to sister Drinda Butts by phone to verify medication compliance. It appears patient was not taking her Eliquis and unclear still if taking any ASA. Patient has significant expressive and mild receptive aphasia on exam   OBJECTIVE Lab Results: CBC:  Recent Labs  Lab 03/10/17 1932 03/11/17 0447  WBC 6.1 6.0  HGB 13.5 11.9*  HCT 40.1 35.9*  MCV 93.5 92.8  PLT 249 230   BMP: Recent Labs  Lab 03/10/17 1932 03/11/17 0447  NA 142 141  K 3.4* 3.3*  CL 106 106  CO2 23 23  GLUCOSE 85 82  BUN 10 9  CREATININE 0.85 0.79  CALCIUM 9.5 9.2  MG  --  1.6*   Liver Function Tests:  Recent Labs  Lab 03/10/17 1932  AST 32  ALT 24  ALKPHOS 71  BILITOT 0.5  PROT 7.1  ALBUMIN 4.0   Coagulation Studies:  Recent Labs    03/10/17 1932  INR 0.95   Urine Drug Screen:     Component Value Date/Time   LABOPIA NONE DETECTED 08/16/2012 0205   COCAINSCRNUR NONE DETECTED 08/16/2012 0205   LABBENZ POSITIVE (A) 08/16/2012 0205   AMPHETMU NONE DETECTED 08/16/2012 0205   THCU NONE DETECTED 08/16/2012 0205   LABBARB NONE  DETECTED 08/16/2012 0205    Alcohol Level:  Recent Labs  Lab 03/10/17 1932  ETH <10   PHYSICAL EXAM Temp:  [97.9 F (36.6 C)] 97.9 F (36.6 C) (02/08 0318) Pulse Rate:  [65-83] 73 (02/08 0653) Resp:  [16-23] 16 (02/08 0653) BP: (113-160)/(73-94) 142/73 (02/08 0653) SpO2:  [96 %-100 %] 100 % (02/08 0653) Weight:  [43.1 kg (95 lb)] 43.1 kg (95 lb) (02/07 1928) General - Well nourished, well developed, in no apparent distress HEENT-  Normocephalic,  Cardiovascular - Regular rate and rhythm  Respiratory - Lungs clear bilaterally. No wheezing. Abdomen - soft and non-tender, BS normal Extremities: No edema in both lower extremities.  Right hand examination showed swelling and tenderness to palpation on the fourth and fifth metacarpals on the right hand. Neurological Exam : Patient is awake, alert, oriented to self. She has moderate expressive aphasia. she can follow simple midline and one-step commands. She was able to name a watch, but unable to name ring or thumb or knuckles. She was able to repeat a sentence with severe dysarthria. Her speech is severely dysarthric. Cranial nerves: Pupils equal round reactive to light, appears to not blink to threat from the right, blinks to threat from the left, mild right nasolabial fold flattening, shoulder shrug intact, tongue midline. Motor exam: 4/5 right upper and lower extremity.  No vertical drift in the right upper extremity but there is vertical drift in the right lower extremity.  No drift on the left side with 5/5 strength. Sensory exam: Decreased sensation to light touch on the right. Coordination: Intact finger-nose-finger on the left.  Slow finger-nose-finger on the right. Gait examination was deferred at this time.  IMAGING: I have personally reviewed the radiological images below and agree with the radiology interpretations.  Ct Head Wo Contrast Result Date: 03/10/2017 IMPRESSION: 1. No definite CT evidence for acute intracranial  abnormality. Old left parietal, posterior temporal and occipital infarcts. 2. Suspected 6 mm isodense nodule near the foramen of Monro, new since 2014, possible colloid nodule, however given isodense appearance and new finding suggest further evaluation with nonemergent MRI. Electronically Signed   By: Jasmine Pang M.D.   On: 03/10/2017 20:08   Ct Angio Head/ Neck W Or Wo Contrast Result Date: 03/11/2017 IMPRESSION: 1. Negative CTA for large vessel occlusion. 2. Severe greater than 80% short-segment proximal right ICA stenosis. 3. Extensive atherosclerotic change throughout the left carotid artery system without hemodynamically significant stenosis. Occlusion of the right vertebral artery at its origin, minimal scant thready distal reconstitution as above. 4. Severe stenosis at the origin of the left vertebral artery. Dominant left vertebral artery otherwise widely patent. 5. Extensive intracranial atherosclerosis as above, most notable within the carotid siphons and basilar artery. 6. Emphysema. Electronically Signed   By: Rise Mu M.D.   On: 03/11/2017 06:25   Mr Brain Ilda Basset  Contrast Result Date: 03/11/2017 . IMPRESSION: 1. Patchy small volume acute ischemic nonhemorrhagic left MCA territory cortical infarcts involving the left frontal and parietal lobes as above. 2. Moderate to large-sized remote left posterior MCA and PCA territory infarcts. 3. Age-related cerebral atrophy. Electronically Signed   By: Rise Mu M.D.   On: 03/11/2017 01:35   Echocardiogram:                                              PENDING B/L Carotid U/S:                                                PENDING     IMPRESSION: Ms. BERNETA SCONYERS is a 67 y.o. female with PMH of coronary artery disease, hypertension, hyperlipidemia, prior left MCA and left PCA territory stroke with residual right hemiparesis and mild aphasia, atrial fibrillation not taking her Eliquis, presented to the emergency room for  evaluation of worsening aphasia and right-sided weakness.  She was not a candidate for TPA or endovascular thrombectomy based on timing of last known normal.  MRI reveals:  Patchy small volume acute ischemic nonhemorrhagic left MCA territory cortical infarcts involving the left frontal and parietal lobes  Suspected Etiology: cardioembolic from AFIB not on [facility] therapy Resultant Symptoms: worsening aphasia and right-sided weakness. Stroke Risk Factors: atrial fibrillation, carotid stenosis, hypertension and smoking Other Stroke Risk Factors: Advanced age, Cigarette smoker, CAD, Hx stroke, Family Hx CVA  Outstanding Stroke Work-up Studies:     Echocardiogram:                                                    PENDING B/L Carotid U/S:                                                     PENDING  03/11/2017 ASSESSMENT:   Neuro exam unchanged with Right sided weakness and Aphasia. According to sister Drinda Butts patient was not compliant with her Eliquis. Patient will need intensive Rehab hopefully a CIR candidate. Eliquis to be restarted today.   PLAN  03/11/2017: Continue Eliquis/ Statin Frequent neuro checks Telemetry monitoring PT/OT/SLP Consult PM & Rehab Consult Case Management /MSW Ongoing aggressive stroke risk factor management Patient's family will be counseled to be compliant with her antithrombotic medications Patient's family will be counseled on Lifestyle modifications including, Diet, Exercise, and Stress Follow up with [facility] in 6 weeks  HX OF STROKES: Moderate to large-sized left posterior MCA and PCA territory infarcts. Baseline findings: residual right hemiparesis and mild aphasia  Extensive IC stenosis and Right ICA Stenosis: On Eliquis for now  DYSPHAGIA: NPO until passes SLP swallow evaluation Aspiration Precautions in progress  AFIB, CHRONIC: Restart Eliquis BID per pharmacy today if able to swallow Cardiology follow up   HYPERTENSION: Stable,  Avoid Hypotension and Dehydration Permissive hypertension (OK if <220/120) for 24-48 hours post stroke and then gradually normalized  within 5-7 days. Long term BP goal normotensive. May slowly restart home B/P medications after 48 hours Home Meds: Lisinopril, Norvasc  HYPERLIPIDEMIA:    Component Value Date/Time   CHOL 99 03/11/2017 0447   TRIG 48 03/11/2017 0447   HDL 36 (L) 03/11/2017 0447   CHOLHDL 2.8 03/11/2017 0447   VLDL 10 03/11/2017 0447   LDLCALC 53 03/11/2017 0447  Home Meds:  NONE LDL  goal < 70 Started on  Lipitor to 10 mg daily Continue statin at discharge  R/O DIABETES: Lab Results  Component Value Date   HGBA1C 5.6 03/11/2017  HgbA1c goal < 7.0  TOBACCO ABUSE & POLYSUBSTANCE ABUSE UDS+ Current smoker Smoking cessation counseling provided Nicotine patch provided  Other Active Problems: Principal Problem:   Acute ischemic left MCA stroke (HCC) Active Problems:   Essential hypertension   Atrial fibrillation (HCC)   CAD (coronary artery disease)   Displaced fracture of proximal phalanx of left little finger, initial encounter for closed fracture    Hospital day # 0 VTE prophylaxis: Lovenox  Diet : Diet NPO time specified Fall precautions   FAMILY UPDATES: No family at bedside Spoke by phone, to sister Trish Mage, to verify if patient was taking her Eliquis or ASA, it did not appear that the patient had been compliant with her medications  TEAM UPDATES: Lenox Ponds, MD   Prior Home Stroke Medications:  Patient was non compliant with ASA and Eliquis  Discharge Stroke Meds:  Please discharge patient on Eliquis (apixaban) daily   Disposition: 06-Home-Health Care Svc Therapy Recs:               PENDING Follow Up:  Follow-up Information    Micki Riley, MD. Schedule an appointment as soon as possible for a visit in 6 week(s).   Specialties:  Neurology, Radiology Contact information: [address] [place]  [address] [phone]          Patient, No Pcp Per -PCP Follow up in 1-2 weeks   Case Management aware of need   Assessment & plan discussed with with attending physician and they are in agreement.    Beryl Meager, ANP-C Stroke Neurology Team 03/11/2017 11:56 AM I have personally examined this patient, reviewed notes, independently viewed imaging studies, participated in medical decision making and plan of care.ROS completed by me personally and pertinent positives fully documented  I have made any additions or clarifications directly to the above note. Agree with note above. She has presented with expressive aphasia secondary to embolic small left MCA branch infarcts likely due to atrial fibrillation and noncompliance with her eliquis. Continue ongoing stroke workup and aggressive risk factor modification. Discussed with her sister over the phone. Discussed with Dr. Edward Jolly. Greater than 50% time during this 35 minute visit was spent on counseling and coordination of care about an embolic stroke, atrial fibrillation and discussion about treatment planning and answering questions  Delia Heady, MD Medical Director [facility] Pager: [phone] 03/11/2017 1:05 PM  To contact Stroke Continuity provider, please refer to [email or web address]. After hours, contact General Neurology

## 2017-03-11 NOTE — ED Notes (Signed)
Pt FAILED swallow screen-- needs speech therapy consult-- will remain NPO until then-- including meds

## 2017-03-11 NOTE — Evaluation (Signed)
Physical Therapy Evaluation Patient Details Name: [name] [name] MRN: [id number] DOB: August 23, 1950 Today's Date: 03/11/2017   History of Present Illness  Pt is a 67 y/o female admitted secondary to R weakness and fall. 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 Imaging revealed displaced fracture of the proximal phalanx on R little finger. Pt is s/p closed reduction and splinting. PMH includes CVA, CAD s/p CABG, HTN, a fib, and tobacco abuse.   Clinical Impression  Pt admitted secondary to problem above with deficits below. Pt presenting with weakness, decreased coordination in R extremities, cognitive deficits, decreased balance, expressive aphasia, and visual deficits in the R eye. Pt requiring min A for mobility this session secondary to unsteadiness. Also demonstrated slowed motor processing when asked to perform mobility tasks. Feel pt is good candidate for CIR given current deficits to ensure return to PLOF. Will continue to follow acutely to maximize functional mobility independence and safety.     Follow Up Recommendations CIR;Supervision/Assistance - 24 hour    Equipment Recommendations  None recommended by PT    Recommendations for Other Services Rehab consult     Precautions / Restrictions Precautions Precautions: Fall Restrictions Weight Bearing Restrictions: Yes RUE Weight Bearing: Non weight bearing Other Position/Activity Restrictions: Assume NWB on R hand given new fracture. No orders for WB status      Mobility  Bed Mobility Overal bed mobility: Needs Assistance Bed Mobility: Supine to Sit;Sit to Supine     Supine to sit: Min assist Sit to supine: Min assist   General bed mobility comments: Min A for trunk elevation and scooting hips to EOB. Multiple verbal cues throughout to maintain NWB on R hand. Min A for LE assist for return back to bed.   Transfers Overall transfer level: Needs  assistance Equipment used: 1 person hand held assist Transfers: Sit to/from Stand Sit to Stand: Min assist         General transfer comment: Min A for lift assist and steadying assist. Verbal cues not to use R hand to press up into standing.   Ambulation/Gait Ambulation/Gait assistance: Min assist Ambulation Distance (Feet): 10 Feet Assistive device: 1 person hand held assist Gait Pattern/deviations: Step-through pattern;Decreased stride length Gait velocity: Decreased  Gait velocity interpretation: Below normal speed for age/gender General Gait Details: Pt unsteady during gait with coordination deficits noted in RLE during RLE advancement. Required min A throughout for steadying during gait.   Stairs            Wheelchair Mobility    Modified Rankin (Stroke Patients Only) Modified Rankin (Stroke Patients Only) Pre-Morbid Rankin Score: Slight disability Modified Rankin: Moderately severe disability     Balance Overall balance assessment: Needs assistance Sitting-balance support: Feet supported;Single extremity supported Sitting balance-Leahy Scale: Fair     Standing balance support: During functional activity;Single extremity supported Standing balance-Leahy Scale: Poor Standing balance comment: Reliant on LUE support and external support for steadying.              High level balance activites: Backward walking High Level Balance Comments: Very unsteady when performing backwards walking. Required cues for sequencing.              Pertinent Vitals/Pain Pain Assessment: No/denies pain    Home Living Family/patient expects to be discharged to:: Private residence Living Arrangements: Spouse/significant other Available Help at Discharge: Family;Available 24 hours/day Type of Home: House Home Access: Level entry  Home Layout: One level   Additional Comments: Unsure of accuracy of info given pt's expressive aphasia and cognitive deficits. Some  information given different than previous admission.     Prior Function Level of Independence: Independent         Comments: Pt reports she was independent     Hand Dominance   Dominant Hand: Right    Extremity/Trunk Assessment   Upper Extremity Assessment Upper Extremity Assessment: Defer to OT evaluation    Lower Extremity Assessment Lower Extremity Assessment: Generalized weakness;RLE deficits/detail RLE Deficits / Details: Grossly 4-/5 throughout. Noted Coordination difficulties with RLE during gait.  RLE Coordination: decreased gross motor;decreased fine motor    Cervical / Trunk Assessment Cervical / Trunk Assessment: Kyphotic  Communication   Communication: Expressive difficulties  Cognition Arousal/Alertness: Awake/alert Behavior During Therapy: WFL for tasks assessed/performed Overall Cognitive Status: No family/caregiver present to determine baseline cognitive functioning                                 General Comments: Pt with previous stroke so unsure of cognitive deficits are new. Pt with slowed motor processing and required verbal and tactile cues to perform tasks.       General Comments General comments (skin integrity, edema, etc.): Attempted to test coordination formally with heel to shin test and finger to nose test, however, pt with difficulty understanding with verbal and tactile cues. When attempting to test finger to nose with RUE, pt with facial expression and squinting eyes indicating she may not be able to clearly see PT finger; unsure given pt's expressive aphasia. Tested peripheral vision and pt seemed to have deficits with peripheral vision in R eye close to midline.     Exercises     Assessment/Plan    PT Assessment Patient needs continued PT services  PT Problem List Decreased strength;Decreased range of motion;Decreased cognition;Decreased coordination;Decreased knowledge of use of DME;Decreased safety awareness;Decreased  knowledge of precautions;Decreased mobility;Decreased balance;Decreased activity tolerance       PT Treatment Interventions DME instruction;Gait training;Functional mobility training;Neuromuscular re-education;Balance training;Therapeutic exercise;Therapeutic activities;Cognitive remediation;Patient/family education    PT Goals (Current goals can be found in the Care Plan section)  Acute Rehab PT Goals Patient Stated Goal: none stated  PT Goal Formulation: Patient unable to participate in goal setting Time For Goal Achievement: 03/25/17 Potential to Achieve Goals: Good    Frequency Min 4X/week   Barriers to discharge        Co-evaluation               AM-PAC PT "6 Clicks" Daily Activity  Outcome Measure Difficulty turning over in bed (including adjusting bedclothes, sheets and blankets)?: A Lot Difficulty moving from lying on back to sitting on the side of the bed? : Unable Difficulty sitting down on and standing up from a chair with arms (e.g., wheelchair, bedside commode, etc,.)?: Unable Help needed moving to and from a bed to chair (including a wheelchair)?: A Little Help needed walking in hospital room?: A Little Help needed climbing 3-5 steps with a railing? : A Lot 6 Click Score: 12    End of Session Equipment Utilized During Treatment: Gait belt Activity Tolerance: Patient tolerated treatment well Patient left: in bed;with call bell/phone within reach Nurse Communication: Mobility status PT Visit Diagnosis: Unsteadiness on feet (R26.81);Other symptoms and signs involving the nervous system (R29.898);Muscle weakness (generalized) (M62.81);History of falling (Z91.81)    Time: 1610-9604 PT Time  Calculation (min) (ACUTE ONLY): 17 min   Charges:   PT Evaluation $PT Eval Moderate Complexity: 1 Mod     PT G Codes:        Gladys Damme, PT, DPT  Acute Rehabilitation Services  Pager: [phone]   Lehman Prom 03/11/2017, 12:49 PM

## 2017-03-11 NOTE — H&P (Signed)
History and Physical    [name] [name]:[id number] DOB: 01-02-1951 DOA: 03/11/2017  PCP: Patient, No Pcp Per   Patient coming from: Home  Chief Complaint: Facial droop, slurred speech   HPI: [name] [name] is a 67 y.o. female with medical history significant for history of CVA, CAD status post CABG, hypertension, and paroxysmal atrial fibrillation on Eliquis, now presenting to the emergency department for evaluation of slurred speech and facial droop.  History is obtained from the patient's brother at bedside, review of the EMR, and discussion with ED personnel.  She had reportedly been in her usual state until 03/09/2017 when she developed right-sided weakness, causing her to fall onto her right hand.  Since that time, she has had difficulty with her speech, a facial droop, and pain and swelling involving the ulnar right hand.  She denies chest pain or palpitations and denies fevers or chills.  ED Course: Upon arrival to the ED, patient is found to be afebrile, saturating well on room air, EKG features a sinus rhythm with RBBB.  Radiographs of the right hand demonstrate displaced and angulated fracture at the base of the right proximal fifth phalanx.  Noncontrast head CT is negative for acute pathology.  MRI brain reveals patchy small volume acute ischemic nonhemorrhagic left MCA infarct.  Chemistry panel is notable for mild hypokalemia and CBC is unremarkable.  Neurology was consulted by the ED physician and recommended medical admission.  Patient remains hemodynamically stable, in no apparent respiratory distress, and she will be observed on the telemetry unit for ongoing evaluation and management of acute left MCA ischemic infarct.  Review of Systems:  All other systems reviewed and apart from HPI, are negative.  Past Medical History:  Diagnosis Date  . Hyperlipidemia LDL goal < 70 07/24/2012  . Hypertension   . 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 07/25/12 07/28/2012   POSTOPERATIVE DIAGNOSIS: Severe 3-vessel coronary disease, status post  myocardial infarction.  PROCEDURE: Median sternotomy, extracorporeal circulation, coronary  artery bypass grafting x5 (left internal mammary artery to LAD,  sequential saphenous vein graft to obtuse marginal 1, obtuse marginal 2,  and posterior descending with Y graft to 1st diagonal), endoscopic vein  harvest, left thigh.    Marland Kitchen STEMI (ST elevation myocardial infarction), secondary to disease of RCA, with disease of LAD and diag. branch and chronic non dominant LCX occlusion for CABG  07/21/2012  . Stroke (HCC)   . Tobacco abuse 07/21/2012    Past Surgical History:  Procedure Laterality Date  . CORONARY ARTERY BYPASS GRAFT N/A 07/25/2012   Procedure: CORONARY ARTERY BYPASS GRAFTING (CABG) times five with Left  Endoscopic Saphenous Vein Harvest and Left Internal  Mammary ;  Surgeon: Loreli Slot, MD;  Location: [facility] OR;  Service: Open Heart Surgery;  Laterality: N/A;  . LEFT HEART CATHETERIZATION WITH CORONARY ANGIOGRAM N/A 07/21/2012   Procedure: LEFT HEART CATHETERIZATION WITH CORONARY ANGIOGRAM;  Surgeon: [name], MD;  Location: [facility] CATH LAB;  Service: Cardiovascular;  Laterality: N/A;  . LOOP RECORDER IMPLANT  08-15-2012   Medtronic LinQ implanted by Dr Johney Frame for cryptogenic stroke  . TEE WITHOUT CARDIOVERSION N/A 08/15/2012   Procedure: TRANSESOPHAGEAL ECHOCARDIOGRAM (TEE);  Surgeon: Wendall Stade, MD;  Location: [facility] ENDOSCOPY;  Service: Cardiovascular;  Laterality: N/A;     reports that she has been smoking cigarettes.  She started smoking about 4 years ago. She has been smoking about 0.25 packs per day. she has never used smokeless tobacco. She  reports that she does not drink alcohol or use drugs.  No Known Allergies  Family History  Problem Relation Age of Onset  . Hypertension Mother   . Stroke Brother        Died at age 25 secondary to MI  . CAD Brother      Prior to Admission medications     Medication Sig Start Date End Date Taking? Authorizing Provider  amLODipine (NORVASC) 10 MG tablet Take 10 mg by mouth daily.    [provider]  apixaban (ELIQUIS) 5 MG TABS tablet Take 1 tablet (5 mg total) by mouth 2 (two) times daily. 10/17/13   Allred, Fayrene Fearing, MD  atorvastatin (LIPITOR) 80 MG tablet Take 1 tablet (80 mg total) by mouth daily. 08/25/12   Angiulli, Mcarthur Rossetti, PA-C  Cholecalciferol (VITAMIN D) 400 UNITS capsule Take 800 Units by mouth daily.    [provider]  cloNIDine (CATAPRES) 0.2 MG tablet Take 1 tablet (0.2 mg total) by mouth 2 (two) times daily. 08/25/12   Angiulli, Mcarthur Rossetti, PA-C  donepezil (ARICEPT) 5 MG tablet Take 1 tablet (5 mg total) by mouth at bedtime. 07/04/13   Ronal Fear, NP  lisinopril (PRINIVIL,ZESTRIL) 40 MG tablet Take 40 mg by mouth daily.    [provider]  potassium chloride SA (K-DUR,KLOR-CON) 20 MEQ tablet Take 1 tablet (20 mEq total) by mouth daily. 10/18/13   Cassell Clement, MD    Physical Exam: Vitals:   03/11/17 0138 03/11/17 0315 03/11/17 0318 03/11/17 0415  BP: 135/80 (!) 143/94  113/85  Pulse: 82   65  Resp: 16 16  (!) 23  Temp:   97.9 F (36.6 C)   TempSrc:   Oral   SpO2: 100%   96%  Weight:      Height:          Constitutional: NAD, calm, frail  Eyes: PERTLA, lids and conjunctivae normal ENMT: Mucous membranes are moist. Posterior pharynx clear of any exudate or lesions.   Neck: normal, supple, no masses, no thyromegaly Respiratory: clear to auscultation bilaterally, no wheezing, no crackles. Normal respiratory effort.    Cardiovascular: S1 & S2 heard, regular rate and rhythm. No extremity edema. No significant JVD. Abdomen: No distension, no tenderness, no masses palpated. Bowel sounds normal.  Musculoskeletal: no clubbing / cyanosis. Right 5th finger splinted.   Skin: no significant rashes, lesions, ulcers. Warm, dry, well-perfused. Neurologic: Right lower facial weakness. PEERL, EOMI. Strength  diminished on right.  Psychiatric: Normal judgment and insight. Alert and oriented x 3. Normal mood and affect.     Labs on Admission: I have personally reviewed following labs and imaging studies  CBC: Recent Labs  Lab 03/10/17 1932  WBC 6.1  NEUTROABS 2.8  HGB 13.5  HCT 40.1  MCV 93.5  PLT 249   Basic Metabolic Panel: Recent Labs  Lab 03/10/17 1932  NA 142  K 3.4*  CL 106  CO2 23  GLUCOSE 85  BUN 10  CREATININE 0.85  CALCIUM 9.5   GFR: Estimated Creatinine Clearance: 43.1 mL/min (by C-G formula based on SCr of 0.85 mg/dL). Liver Function Tests: Recent Labs  Lab 03/10/17 1932  AST 32  ALT 24  ALKPHOS 71  BILITOT 0.5  PROT 7.1  ALBUMIN 4.0   No results for input(s): LIPASE, AMYLASE in the last 168 hours. No results for input(s): AMMONIA in the last 168 hours. Coagulation Profile: Recent Labs  Lab 03/10/17 1932  INR 0.95   Cardiac Enzymes:  No results for input(s): CKTOTAL, CKMB, CKMBINDEX, TROPONINI in the last 168 hours. BNP (last 3 results) No results for input(s): PROBNP in the last 8760 hours. HbA1C: No results for input(s): HGBA1C in the last 72 hours. CBG: No results for input(s): GLUCAP in the last 168 hours. Lipid Profile: No results for input(s): CHOL, HDL, LDLCALC, TRIG, CHOLHDL, LDLDIRECT in the last 72 hours. Thyroid Function Tests: No results for input(s): TSH, T4TOTAL, FREET4, T3FREE, THYROIDAB in the last 72 hours. Anemia Panel: No results for input(s): VITAMINB12, FOLATE, FERRITIN, TIBC, IRON, RETICCTPCT in the last 72 hours. Urine analysis:    Component Value Date/Time   COLORURINE AMBER (A) 08/16/2012 0205   APPEARANCEUR HAZY (A) 08/16/2012 0205   LABSPEC 1.020 08/16/2012 0205   PHURINE 6.5 08/16/2012 0205   GLUCOSEU NEGATIVE 08/16/2012 0205   HGBUR NEGATIVE 08/16/2012 0205   BILIRUBINUR MODERATE (A) 08/16/2012 0205   KETONESUR 15 (A) 08/16/2012 0205   PROTEINUR NEGATIVE 08/16/2012 0205   UROBILINOGEN 4.0 (H) 08/16/2012  0205   NITRITE NEGATIVE 08/16/2012 0205   LEUKOCYTESUR LARGE (A) 08/16/2012 0205   Sepsis Labs: @LABRCNTIP (procalcitonin:4,lacticidven:4) )No results found for this or any previous visit (from the past 240 hour(s)).   Radiological Exams on Admission: Dg Wrist Complete Right  Result Date: 03/10/2017 CLINICAL DATA:  RIGHT hand pain, stroke EXAM: RIGHT WRIST - COMPLETE 3+ VIEW COMPARISON:  None FINDINGS: Diffuse osseous demineralization. Joint spaces preserved. No acute fracture, dislocation, or bone destruction. IMPRESSION: No acute osseous abnormalities. Electronically Signed   By: Ulyses Southward M.D.   On: 03/10/2017 20:32   Ct Head Wo Contrast  Result Date: 03/10/2017 CLINICAL DATA:  Garbled speech and confusion EXAM: CT HEAD WITHOUT CONTRAST TECHNIQUE: Contiguous axial images were obtained from the base of the skull through the vertex without intravenous contrast. COMPARISON:  MRI brain 08/14/2012, CT brain 08/13/2012 FINDINGS: Brain: No acute territorial infarction, or hemorrhage is visualized. Possible small 6 mm nodule or mass near the foramen of Monro. Encephalomalacia in the left parietal, posterior temporal and left occipital lobes consistent with old infarct. Mild atrophy. Ex vacuo dilatation of left lateral ventricle. Vascular: No hyperdense vessel.  Carotid vascular calcification. Skull: No fracture or suspicious lesion Sinuses/Orbits: No acute finding. Other: None IMPRESSION: 1. No definite CT evidence for acute intracranial abnormality. Old left parietal, posterior temporal and occipital infarcts. 2. Suspected 6 mm isodense nodule near the foramen of Monro, new since 2014, possible colloid nodule, however given isodense appearance and new finding suggest further evaluation with nonemergent MRI. Electronically Signed   By: Jasmine Pang M.D.   On: 03/10/2017 20:08   Mr Brain Wo Contrast  Result Date: 03/11/2017 CLINICAL DATA:  Initial evaluation for acute speech difficulty, right hand  abnormality. EXAM: MRI HEAD WITHOUT CONTRAST TECHNIQUE: Multiplanar, multiecho pulse sequences of the brain and surrounding structures were obtained without intravenous contrast. COMPARISON:  Prior CT from 03/10/2017. FINDINGS: Brain: Diffuse prominence of the CSF containing spaces compatible with generalized cerebral atrophy. Minimal chronic microvascular ischemic changes for age. Encephalomalacia with gliosis within the left temporal occipital region consistent with remote left PCA territory infarct. Associated lacunar infarct within the left thalamus. Additional moderate-sized remote left posterior MCA territory infarct. Associated volume loss with ex vacuo dilatation of the left lateral ventricle. There are patchy multifocal small volume acute ischemic predominantly cortical infarcts involving the left frontoparietal region (series 3, images 29-39). No significant mass effect. No associated hemorrhage. No other evidence for acute or subacute ischemia. No mass lesion, midline shift  or mass effect. No hydrocephalus. No extra-axial fluid collection. Major dural sinuses are grossly patent. Pituitary gland suprasellar region normal. Midline structures intact and normal. Previously question nodular density near the foramen of Monro felt to be related to patient positioning. Vascular: Major intravascular flow voids are maintained at the skull base. Skull and upper cervical spine: Craniocervical junction normal. Upper cervical spine within normal limits. Bone marrow signal intensity within normal limits. No scalp soft tissue abnormality. Sinuses/Orbits: Globes and orbital soft tissues within normal limits. Paranasal sinuses are largely clear. No significant mastoid effusion. Inner ear structures grossly normal. Other: None. IMPRESSION: 1. Patchy small volume acute ischemic nonhemorrhagic left MCA territory cortical infarcts involving the left frontal and parietal lobes as above. 2. Moderate to large-sized remote left  posterior MCA and PCA territory infarcts. 3. Age-related cerebral atrophy. Electronically Signed   By: Rise Mu M.D.   On: 03/11/2017 01:35   Dg Hand Complete Right  Result Date: 03/10/2017 CLINICAL DATA:  Hand pain, history of stroke EXAM: RIGHT HAND - COMPLETE 3+ VIEW COMPARISON:  None FINDINGS: Diffuse osseous demineralization. Transverse fracture at base of proximal phalanx RIGHT little finger with displacement, angulation, and deformity. Joint spaces preserved. No additional fracture, dislocation, or bone destruction. Dorsal soft tissue swelling overlying the MCP joints. IMPRESSION: Displaced and angulated fracture at base of proximal phalanx RIGHT little finger. Electronically Signed   By: Ulyses Southward M.D.   On: 03/10/2017 20:31   Dg Finger Little Right  Result Date: 03/11/2017 CLINICAL DATA:  Status post reduction of right fifth proximal phalanx fracture. Initial encounter. EXAM: RIGHT LITTLE FINGER 2+V COMPARISON:  None. FINDINGS: There is mildly improved alignment of the fracture through the base of the fifth proximal phalanx, with residual radial and volar displacement. Surrounding soft tissue swelling is noted. No new fractures are seen. IMPRESSION: Mildly improved alignment of the fracture through the base of the fifth proximal phalanx, with residual radial and volar displacement. Electronically Signed   By: Roanna Raider M.D.   On: 03/11/2017 04:26    EKG: Independently reviewed. Sinus rhythm, RBBB.   Assessment/Plan   1. Acute ischemic left MCA infarct  - Presents with aphasia and right-sided weakness, onset 03/09/17  - Head CT revealed old CVA only; acute left MCA infarct noted on MRI without hemorrhage  - Neurology is consulting and much appreciated  - Obtain CTA head & neck, echocardiogram, fasting lipid panel, and A1c - Continue Lipitor 80 mg, start full-dose ASA, stop Eliquis for now while clarifying whether she has been taking  - Continue frequent neuro checks and  PT/OT/SLP evals    2. Paroxysmal atrial fibrillation  - In a sinus rhythm on admission  - CHADS-VASc 6 (age, gender, CVA x2, CAD, HTN)  - Is prescribed Eliquis but not clear if she has been taking this; neurology recommended stopping for now and pt's brother will bring her home meds in the am for review  - Continue cardiac monitoring    3. CAD  - No anginal complaints  - Continue Lipitor and start ASA    4. Displaced fracture left 5th finger  - Patient reports falling 2 days ago secondary to right-sided weakness  - Radiographs reveal displaced fracture of proximal 5th finger on right  - Status-post reduction and splinting in ED   5. Hypertension  - Permit HTN up to SBP 220 in acute-phase of ischemic CVA   6. Hypokalemia  - Serum potassium is 3.4 on admission  - KCl added to  IVF - Repeat chem panel in am     DVT prophylaxis: Lovenox Code Status: Full  Family Communication: Brother updated at bedside Disposition Plan: Observe on telemetry Consults called: Neurology Admission status: Observation    Briscoe Deutscher, MD [facility] Pager [phone]  If 7PM-7AM, please contact night-coverage [email or web address] Password [facility]  03/11/2017, 4:39 AM

## 2017-03-11 NOTE — ED Notes (Signed)
Patient transported to CT 

## 2017-03-11 NOTE — ED Notes (Signed)
Pt. Voided in bed pan, small amount of stool also in bed pan. Unable to use for urine sample. Pt set up with purwick in place

## 2017-03-11 NOTE — ED Notes (Signed)
Physical therapy at bedside

## 2017-03-11 NOTE — ED Provider Notes (Signed)
[facility] EMERGENCY DEPARTMENT Provider Note   CSN: [id number] Arrival date & time: 03/10/17  1916     History   Chief Complaint Chief Complaint  Patient presents with  . Aphasia    HPI [name] [name] is a 67 y.o. female.  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  She described an onset of difficulty speaking and feeling poorly yesterday, and was prompted by family to come in.  Patient last seen normal Thursday morning. She did not want to come into the hospital yesterday, however she agreed to come in for treatment today.  A family member with [name] [name] today verified this information, however he is unsure of the exact timeline of events.  [name] [name] did not suffer from aphasia after her previous stroke.  She states she is not in any pain anywhere and is not experiencing any pain in her right fifth finger.   The history is provided by the patient and a relative. No language interpreter was used.    Past Medical History:  Diagnosis Date  . Hyperlipidemia LDL goal < 70 07/24/2012  . Hypertension   . 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 07/25/12 07/28/2012   POSTOPERATIVE DIAGNOSIS: Severe 3-vessel coronary disease, status post  myocardial infarction.  PROCEDURE: Median sternotomy, extracorporeal circulation, coronary  artery bypass grafting x5 (left internal mammary artery to LAD,  sequential saphenous vein graft to obtuse marginal 1, obtuse marginal 2,  and posterior descending with Y graft to 1st diagonal), endoscopic vein  harvest, left thigh.    Marland Kitchen STEMI (ST elevation myocardial infarction), secondary to disease of RCA, with disease of LAD and diag. branch and chronic non dominant LCX occlusion for CABG  07/21/2012  . Stroke (HCC)   . Tobacco abuse 07/21/2012    Patient Active Problem List     Diagnosis Date Noted  . Atrial fibrillation (HCC) 10/19/2013  . 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 Aphasia as late effect of cerebrovascular accident 09/25/2012  . Embolic cerebral infarction (HCC) 08/16/2012  . 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 07/25/12 07/28/2012  . Hyperlipidemia LDL goal < 70 07/24/2012  . STEMI (ST elevation myocardial infarction), secondary to disease of RCA, with disease of LAD and diag. branch and chronic non dominant LCX occlusion for CABG  07/21/2012  . Essential hypertension 07/21/2012  . Tobacco abuse 07/21/2012    Past Surgical History:  Procedure Laterality Date  . CORONARY ARTERY BYPASS GRAFT N/A 07/25/2012   Procedure: CORONARY ARTERY BYPASS GRAFTING (CABG) times five with Left  Endoscopic Saphenous Vein Harvest and Left Internal  Mammary ;  Surgeon: Loreli Slot, MD;  [facility];  Service: Open Heart Surgery;  Laterality: N/A;  . LEFT HEART CATHETERIZATION WITH CORONARY ANGIOGRAM N/A 07/21/2012   Procedure: LEFT HEART CATHETERIZATION WITH CORONARY ANGIOGRAM;  Surgeon: [name], MD;  Location: [facility] CATH LAB;  Service: Cardiovascular;  Laterality: N/A;  . LOOP RECORDER IMPLANT  08-15-2012   Medtronic LinQ implanted by Dr Johney Frame for cryptogenic stroke  . TEE WITHOUT CARDIOVERSION N/A 08/15/2012   Procedure: TRANSESOPHAGEAL ECHOCARDIOGRAM (TEE);  Surgeon: Wendall Stade, MD;  Location: [facility] ENDOSCOPY;  Service: Cardiovascular;  Laterality: N/A;    OB History  No data available       Home Medications    Prior to Admission medications   Medication Sig Start Date End Date Taking? Authorizing Provider  amLODipine (NORVASC) 10 MG tablet Take 10 mg by mouth daily.    [provider]  apixaban (ELIQUIS) 5 MG TABS tablet Take 1 tablet (5 mg total) by mouth 2 (two) times daily. 10/17/13   Allred, Fayrene Fearing, MD   atorvastatin (LIPITOR) 80 MG tablet Take 1 tablet (80 mg total) by mouth daily. 08/25/12   Angiulli, Mcarthur Rossetti, PA-C  Cholecalciferol (VITAMIN D) 400 UNITS capsule Take 800 Units by mouth daily.    [provider]  cloNIDine (CATAPRES) 0.2 MG tablet Take 1 tablet (0.2 mg total) by mouth 2 (two) times daily. 08/25/12   Angiulli, Mcarthur Rossetti, PA-C  donepezil (ARICEPT) 5 MG tablet Take 1 tablet (5 mg total) by mouth at bedtime. 07/04/13   Ronal Fear, NP  lisinopril (PRINIVIL,ZESTRIL) 40 MG tablet Take 40 mg by mouth daily.    [provider]  potassium chloride SA (K-DUR,KLOR-CON) 20 MEQ tablet Take 1 tablet (20 mEq total) by mouth daily. 10/18/13   Cassell Clement, MD    Family History Family History  Problem Relation Age of Onset  . Hypertension Mother   . Stroke Brother        Died at age 40 secondary to MI  . CAD Brother     Social History Social History   Tobacco Use  . Smoking status: Current Some Day Smoker    Packs/day: 0.25    Types: Cigarettes    Start date: 08/25/2012  . Smokeless tobacco: Never Used  Substance Use Topics  . Alcohol use: No  . Drug use: No     Allergies   Patient has no known allergies.   Review of Systems Review of Systems  Musculoskeletal: Positive for joint swelling (right fifth finger).  Neurological: Positive for facial asymmetry, speech difficulty and weakness.  All other systems reviewed and are negative.    Physical Exam Updated Vital Signs BP 135/80 (BP Location: Right Arm)   Pulse 82   Resp 16   Ht 5\' 4"  (1.626 m)   Wt 43.1 kg (95 lb)   SpO2 100%   BMI 16.31 kg/m   Physical Exam  Constitutional: She is oriented to person, place, and time. She appears well-developed and well-nourished. She is cooperative.  HENT:  Head: Normocephalic and atraumatic.  Eyes: Conjunctivae and EOM are normal. Pupils are equal, round, and reactive to light. No scleral icterus.  Neck: Normal range of motion. Neck supple. No tracheal  deviation present.  Cardiovascular: Normal rate and regular rhythm. Exam reveals no gallop and no friction rub.  No murmur heard. Pulmonary/Chest: Effort normal and breath sounds normal. No respiratory distress. She has no wheezes. She has no rales. She exhibits no tenderness.  Abdominal: Soft. Bowel sounds are normal. She exhibits no distension and no mass. There is no tenderness. There is no rebound and no guarding.  Musculoskeletal: Normal range of motion. She exhibits deformity (right fifth finger abducted at the base of the proximal phalanx). She exhibits no edema or tenderness.  Flexion of shoulder and hip intact.  4/5 strength of right shoulder and hip flexion, 4/5 strength of left shoulder flexion and hip flexion.  Grip strength decreased in right hand.   Neurological: She is alert and oriented to person, place, and time. A cranial nerve deficit (right facial droop present ) and sensory deficit (  decreased sensation to right upper and lower extremities ) is present. She exhibits abnormal muscle tone (right hand in fixed flexion).  Skin: Skin is warm and dry. No rash noted. No erythema.  Psychiatric: She has a normal mood and affect. Her behavior is normal. Judgment and thought content normal.  Nursing note and vitals reviewed.    ED Treatments / Results  Labs (all labs ordered are listed, but only abnormal results are displayed) Labs Reviewed  COMPREHENSIVE METABOLIC PANEL - Abnormal; Notable for the following components:      Result Value   Potassium 3.4 (*)    All other components within normal limits  PROTIME-INR  ETHANOL  CBC WITH DIFFERENTIAL/PLATELET  URINALYSIS, ROUTINE W REFLEX MICROSCOPIC    EKG  EKG Interpretation None       Radiology Dg Wrist Complete Right  Result Date: 03/10/2017 CLINICAL DATA:  RIGHT hand pain, stroke EXAM: RIGHT WRIST - COMPLETE 3+ VIEW COMPARISON:  None FINDINGS: Diffuse osseous demineralization. Joint spaces preserved. No acute fracture,  dislocation, or bone destruction. IMPRESSION: No acute osseous abnormalities. Electronically Signed   By: Ulyses Southward M.D.   On: 03/10/2017 20:32   Ct Head Wo Contrast  Result Date: 03/10/2017 CLINICAL DATA:  Garbled speech and confusion EXAM: CT HEAD WITHOUT CONTRAST TECHNIQUE: Contiguous axial images were obtained from the base of the skull through the vertex without intravenous contrast. COMPARISON:  MRI brain 08/14/2012, CT brain 08/13/2012 FINDINGS: Brain: No acute territorial infarction, or hemorrhage is visualized. Possible small 6 mm nodule or mass near the foramen of Monro. Encephalomalacia in the left parietal, posterior temporal and left occipital lobes consistent with old infarct. Mild atrophy. Ex vacuo dilatation of left lateral ventricle. Vascular: No hyperdense vessel.  Carotid vascular calcification. Skull: No fracture or suspicious lesion Sinuses/Orbits: No acute finding. Other: None IMPRESSION: 1. No definite CT evidence for acute intracranial abnormality. Old left parietal, posterior temporal and occipital infarcts. 2. Suspected 6 mm isodense nodule near the foramen of Monro, new since 2014, possible colloid nodule, however given isodense appearance and new finding suggest further evaluation with nonemergent MRI. Electronically Signed   By: Jasmine Pang M.D.   On: 03/10/2017 20:08   Mr Brain Wo Contrast  Result Date: 03/11/2017 CLINICAL DATA:  Initial evaluation for acute speech difficulty, right hand abnormality. EXAM: MRI HEAD WITHOUT CONTRAST TECHNIQUE: Multiplanar, multiecho pulse sequences of the brain and surrounding structures were obtained without intravenous contrast. COMPARISON:  Prior CT from 03/10/2017. FINDINGS: Brain: Diffuse prominence of the CSF containing spaces compatible with generalized cerebral atrophy. Minimal chronic microvascular ischemic changes for age. Encephalomalacia with gliosis within the left temporal occipital region consistent with remote left PCA territory  infarct. Associated lacunar infarct within the left thalamus. Additional moderate-sized remote left posterior MCA territory infarct. Associated volume loss with ex vacuo dilatation of the left lateral ventricle. There are patchy multifocal small volume acute ischemic predominantly cortical infarcts involving the left frontoparietal region (series 3, images 29-39). No significant mass effect. No associated hemorrhage. No other evidence for acute or subacute ischemia. No mass lesion, midline shift or mass effect. No hydrocephalus. No extra-axial fluid collection. Major dural sinuses are grossly patent. Pituitary gland suprasellar region normal. Midline structures intact and normal. Previously question nodular density near the foramen of Monro felt to be related to patient positioning. Vascular: Major intravascular flow voids are maintained at the skull base. Skull and upper cervical spine: Craniocervical junction normal. Upper cervical spine within normal limits. Bone marrow signal intensity within  normal limits. No scalp soft tissue abnormality. Sinuses/Orbits: Globes and orbital soft tissues within normal limits. Paranasal sinuses are largely clear. No significant mastoid effusion. Inner ear structures grossly normal. Other: None. IMPRESSION: 1. Patchy small volume acute ischemic nonhemorrhagic left MCA territory cortical infarcts involving the left frontal and parietal lobes as above. 2. Moderate to large-sized remote left posterior MCA and PCA territory infarcts. 3. Age-related cerebral atrophy. Electronically Signed   By: Rise Mu M.D.   On: 03/11/2017 01:35   Dg Hand Complete Right  Result Date: 03/10/2017 CLINICAL DATA:  Hand pain, history of stroke EXAM: RIGHT HAND - COMPLETE 3+ VIEW COMPARISON:  None FINDINGS: Diffuse osseous demineralization. Transverse fracture at base of proximal phalanx RIGHT little finger with displacement, angulation, and deformity. Joint spaces preserved. No additional  fracture, dislocation, or bone destruction. Dorsal soft tissue swelling overlying the MCP joints. IMPRESSION: Displaced and angulated fracture at base of proximal phalanx RIGHT little finger. Electronically Signed   By: Ulyses Southward M.D.   On: 03/10/2017 20:31    Procedures Reduction of fracture Date/Time: 03/11/2017 4:37 AM Performed by: Roxy Horseman, PA-C Authorized by: Roxy Horseman, PA-C  Consent: Verbal consent obtained. Risks and benefits: risks, benefits and alternatives were discussed Consent given by: patient Patient understanding: patient states understanding of the procedure being performed Patient consent: the patient's understanding of the procedure matches consent given Procedure consent: procedure consent matches procedure scheduled Relevant documents: relevant documents present and verified Test results: test results available and properly labeled Site marked: the operative site was marked Imaging studies: imaging studies available Required items: required blood products, implants, devices, and special equipment available Patient identity confirmed: verbally with patient Time out: Immediately prior to procedure a "time out" was called to verify the correct patient, procedure, equipment, support staff and site/side marked as required. Preparation: Patient was prepped and draped in the usual sterile fashion. Local anesthesia used: yes Anesthesia: digital block  Anesthesia: Local anesthesia used: yes Local Anesthetic: lidocaine 1% without epinephrine Anesthetic total: 4 mL  Sedation: Patient sedated: no  Patient tolerance: Patient tolerated the procedure well with no immediate complications    (including critical care time)  Medications Ordered in ED Medications  lidocaine (PF) (XYLOCAINE) 1 % injection 5 mL (not administered)     Initial Impression / Assessment and Plan / ED Course  I have reviewed the triage vital signs and the nursing  notes.  Pertinent labs & imaging results that were available during my care of the patient were reviewed by me and considered in my medical decision making (see chart for details).     Patient with slurred speech and facial droop.  First noticed by family member yesterday.  Last known normal last night.  Outside of TPA window.  Seen in triage by Ferdinand Cava, who consulted with neurology and was advised to order MRI.  MRI shows new infarcts.    I discussed the case again with Dr. Riley Lam, who recommends hospitalist admission.      Final Clinical Impressions(s) / ED Diagnoses   Final diagnoses:  Cerebrovascular accident (CVA), unspecified mechanism [facility])    ED Discharge Orders    None       Roxy Horseman, PA-C 03/11/17 0440    Gilda Crease, MD 03/11/17 [phone]

## 2017-03-12 ENCOUNTER — Observation Stay (HOSPITAL_COMMUNITY): Payer: Medicare Other

## 2017-03-12 ENCOUNTER — Observation Stay (HOSPITAL_BASED_OUTPATIENT_CLINIC_OR_DEPARTMENT_OTHER): Payer: Medicare Other

## 2017-03-12 DIAGNOSIS — S62616A Displaced fracture of proximal phalanx of right little finger, initial encounter for closed fracture: Secondary | ICD-10-CM | POA: Diagnosis present

## 2017-03-12 DIAGNOSIS — D62 Acute posthemorrhagic anemia: Secondary | ICD-10-CM

## 2017-03-12 DIAGNOSIS — I69351 Hemiplegia and hemiparesis following cerebral infarction affecting right dominant side: Secondary | ICD-10-CM | POA: Diagnosis not present

## 2017-03-12 DIAGNOSIS — I2510 Atherosclerotic heart disease of native coronary artery without angina pectoris: Secondary | ICD-10-CM | POA: Diagnosis present

## 2017-03-12 DIAGNOSIS — I2583 Coronary atherosclerosis due to lipid rich plaque: Secondary | ICD-10-CM

## 2017-03-12 DIAGNOSIS — E785 Hyperlipidemia, unspecified: Secondary | ICD-10-CM | POA: Diagnosis present

## 2017-03-12 DIAGNOSIS — R29810 Facial weakness: Secondary | ICD-10-CM | POA: Diagnosis present

## 2017-03-12 DIAGNOSIS — I25810 Atherosclerosis of coronary artery bypass graft(s) without angina pectoris: Secondary | ICD-10-CM

## 2017-03-12 DIAGNOSIS — F17210 Nicotine dependence, cigarettes, uncomplicated: Secondary | ICD-10-CM | POA: Diagnosis present

## 2017-03-12 DIAGNOSIS — I639 Cerebral infarction, unspecified: Secondary | ICD-10-CM | POA: Diagnosis present

## 2017-03-12 DIAGNOSIS — I10 Essential (primary) hypertension: Secondary | ICD-10-CM

## 2017-03-12 DIAGNOSIS — Z87898 Personal history of other specified conditions: Secondary | ICD-10-CM

## 2017-03-12 DIAGNOSIS — I252 Old myocardial infarction: Secondary | ICD-10-CM | POA: Diagnosis not present

## 2017-03-12 DIAGNOSIS — I63539 Cerebral infarction due to unspecified occlusion or stenosis of unspecified posterior cerebral artery: Secondary | ICD-10-CM | POA: Diagnosis present

## 2017-03-12 DIAGNOSIS — Z720 Tobacco use: Secondary | ICD-10-CM | POA: Diagnosis not present

## 2017-03-12 DIAGNOSIS — I63512 Cerebral infarction due to unspecified occlusion or stenosis of left middle cerebral artery: Secondary | ICD-10-CM | POA: Diagnosis present

## 2017-03-12 DIAGNOSIS — I482 Chronic atrial fibrillation: Secondary | ICD-10-CM | POA: Diagnosis present

## 2017-03-12 DIAGNOSIS — I69322 Dysarthria following cerebral infarction: Secondary | ICD-10-CM | POA: Diagnosis not present

## 2017-03-12 DIAGNOSIS — S62617A Displaced fracture of proximal phalanx of left little finger, initial encounter for closed fracture: Secondary | ICD-10-CM

## 2017-03-12 DIAGNOSIS — I340 Nonrheumatic mitral (valve) insufficiency: Secondary | ICD-10-CM

## 2017-03-12 DIAGNOSIS — R4781 Slurred speech: Secondary | ICD-10-CM | POA: Diagnosis present

## 2017-03-12 DIAGNOSIS — I480 Paroxysmal atrial fibrillation: Secondary | ICD-10-CM | POA: Diagnosis present

## 2017-03-12 DIAGNOSIS — R1310 Dysphagia, unspecified: Secondary | ICD-10-CM | POA: Diagnosis present

## 2017-03-12 DIAGNOSIS — I4510 Unspecified right bundle-branch block: Secondary | ICD-10-CM | POA: Diagnosis present

## 2017-03-12 DIAGNOSIS — W19XXXA Unspecified fall, initial encounter: Secondary | ICD-10-CM | POA: Diagnosis present

## 2017-03-12 DIAGNOSIS — H53461 Homonymous bilateral field defects, right side: Secondary | ICD-10-CM | POA: Diagnosis not present

## 2017-03-12 DIAGNOSIS — Z8673 Personal history of transient ischemic attack (TIA), and cerebral infarction without residual deficits: Secondary | ICD-10-CM | POA: Diagnosis not present

## 2017-03-12 DIAGNOSIS — I6930 Unspecified sequelae of cerebral infarction: Secondary | ICD-10-CM | POA: Diagnosis not present

## 2017-03-12 DIAGNOSIS — R29711 NIHSS score 11: Secondary | ICD-10-CM | POA: Diagnosis not present

## 2017-03-12 DIAGNOSIS — I69320 Aphasia following cerebral infarction: Secondary | ICD-10-CM | POA: Diagnosis not present

## 2017-03-12 DIAGNOSIS — E876 Hypokalemia: Secondary | ICD-10-CM

## 2017-03-12 DIAGNOSIS — R4701 Aphasia: Secondary | ICD-10-CM

## 2017-03-12 DIAGNOSIS — R402413 Glasgow coma scale score 13-15, at hospital admission: Secondary | ICD-10-CM | POA: Diagnosis present

## 2017-03-12 DIAGNOSIS — I6521 Occlusion and stenosis of right carotid artery: Secondary | ICD-10-CM | POA: Diagnosis present

## 2017-03-12 DIAGNOSIS — R29703 NIHSS score 3: Secondary | ICD-10-CM | POA: Diagnosis present

## 2017-03-12 DIAGNOSIS — S62616S Displaced fracture of proximal phalanx of right little finger, sequela: Secondary | ICD-10-CM | POA: Diagnosis not present

## 2017-03-12 DIAGNOSIS — T45516A Underdosing of anticoagulants, initial encounter: Secondary | ICD-10-CM | POA: Diagnosis present

## 2017-03-12 LAB — URINALYSIS, ROUTINE W REFLEX MICROSCOPIC
Bacteria, UA: NONE SEEN
Bilirubin Urine: NEGATIVE
GLUCOSE, UA: NEGATIVE mg/dL
KETONES UR: 20 mg/dL — AB
Leukocytes, UA: NEGATIVE
Nitrite: NEGATIVE
PROTEIN: NEGATIVE mg/dL
Specific Gravity, Urine: 1.026 (ref 1.005–1.030)
pH: 5 (ref 5.0–8.0)

## 2017-03-12 LAB — ECHOCARDIOGRAM COMPLETE

## 2017-03-12 MED ORDER — MAGNESIUM SULFATE 2 GM/50ML IV SOLN
2.0000 g | Freq: Once | INTRAVENOUS | Status: AC
  Administered 2017-03-12: 2 g via INTRAVENOUS
  Filled 2017-03-12: qty 50

## 2017-03-12 MED ORDER — LORAZEPAM 2 MG/ML IJ SOLN: 0.5000 mg | Freq: Four times a day (QID) | INTRAMUSCULAR | Status: DC | PRN

## 2017-03-12 MED ORDER — POTASSIUM CHLORIDE CRYS ER 20 MEQ PO TBCR
40.0000 meq | EXTENDED_RELEASE_TABLET | ORAL | Status: AC
  Administered 2017-03-12 (×2): 40 meq via ORAL
  Filled 2017-03-12 (×2): qty 2

## 2017-03-12 NOTE — Consult Note (Signed)
Physical Medicine and Rehabilitation Consult Reason for Consult: Weakness Referring Physician:  Sallyanne Kuster, MD   HPI: [name] [name] is a 67 y.o. female  with past medical history of tobacco abuse, CVA with residual right-sided weakness (received CIR at that time), PAF, STEMI status post CABG, remote history of seizures (currently not on any medications), ?dementia (pt unsure why she was started on meds), hypertension presented on 03/10/17 with acute left MCA stroke. Per chart review, sister, and patient, patient was noted to have slurred speech and facial droop. Symptoms appear to have started on 2/6 when she felt right-sided weakness and had a fall. She presented to the ED on 2/8. X-rays of right hand were taken which showed fracture of right proximal fifth phalanx. CT head was obtained, reviewed, relatively unremarkable for acute intracranial process. MRI was done which showed left MCA and PCA territory infarct with patchy small acute ischemic nonhemorrhagic left MCA infarcts. Neurology was consult and stroke workup initiated. Patient with resultant exacerbation of right-sided weakness, cognitive deficits, balance impairments, expressive aphasia, and visual deficits. Hospital course further complicated by hypokalemia and acute blood loss anemia. Patient lives with boyfriend of 20 years, who works 12 hour shift.   Review of Systems  Eyes:       Right peripheral vision loss  Musculoskeletal: Positive for falls, joint pain and myalgias.  Neurological: Positive for dizziness, speech change and focal weakness.  All other systems reviewed and are negative.  Past Medical History:  Diagnosis Date  . Acute ischemic left MCA stroke (HCC) 03/11/2017  . Hyperlipidemia LDL goal < 70 07/24/2012  . Hypertension   . 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 07/25/12 07/28/2012   POSTOPERATIVE DIAGNOSIS: Severe 3-vessel coronary disease, status post  myocardial infarction.  PROCEDURE:  Median sternotomy, extracorporeal circulation, coronary  artery bypass grafting x5 (left internal mammary artery to LAD,  sequential saphenous vein graft to obtuse marginal 1, obtuse marginal 2,  and posterior descending with Y graft to 1st diagonal), endoscopic vein  harvest, left thigh.    . Seizures (HCC)    "years ago" (03/11/2017)  . STEMI (ST elevation myocardial infarction), secondary to disease of RCA, with disease of LAD and diag. branch and chronic non dominant LCX occlusion for CABG  07/21/2012  . Stroke [facility]) ?08/2012   "right sided weakness; speech isssues since" (03/11/2017)  . Tobacco abuse 07/21/2012   Past Surgical History:  Procedure Laterality Date  . CARDIAC CATHETERIZATION    . CESAREAN SECTION  X 1  . CORONARY ARTERY BYPASS GRAFT N/A 07/25/2012   Procedure: CORONARY ARTERY BYPASS GRAFTING (CABG) times five with Left  Endoscopic Saphenous Vein Harvest and Left Internal  Mammary ;  Surgeon: Loreli Slot, MD;  Location: [facility] OR;  Service: Open Heart Surgery;  Laterality: N/A;  . DILATION AND CURETTAGE OF UTERUS    . 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 LEFT HEART CATHETERIZATION WITH CORONARY ANGIOGRAM N/A 07/21/2012   Procedure: LEFT HEART CATHETERIZATION WITH CORONARY ANGIOGRAM;  Surgeon: [name], MD;  Location: [facility] CATH LAB;  Service: Cardiovascular;  Laterality: N/A;  . LOOP RECORDER IMPLANT  08-15-2012   Medtronic LinQ implanted by Dr Johney Frame for cryptogenic stroke  . TEE WITHOUT CARDIOVERSION N/A 08/15/2012   Procedure: TRANSESOPHAGEAL ECHOCARDIOGRAM (TEE);  Surgeon: Wendall Stade, MD;  Location: [facility] ENDOSCOPY;  Service: Cardiovascular;  Laterality: N/A;  . TUBAL LIGATION     Family History  Problem Relation Age  of Onset  . Hypertension Mother   . Stroke Brother        Died at age 29 secondary to MI  . CAD Brother    Social History:  reports that she has been smoking cigarettes.  She started smoking about 4 years ago. She has a 16.50 pack-year smoking  history. she has never used smokeless tobacco. She reports that she does not drink alcohol or use drugs. Allergies: No Known Allergies Medications Prior to Admission  Medication Sig Dispense Refill  . amLODipine (NORVASC) 10 MG tablet Take 10 mg by mouth daily.    Marland Kitchen apixaban (ELIQUIS) 5 MG TABS tablet Take 1 tablet (5 mg total) by mouth 2 (two) times daily. 60 tablet 3  . aspirin EC 325 MG tablet Take 325 mg by mouth daily.    Marland Kitchen atorvastatin (LIPITOR) 80 MG tablet Take 1 tablet (80 mg total) by mouth daily. 30 tablet 1  . Cholecalciferol (VITAMIN D) 400 UNITS capsule Take 800 Units by mouth daily.    . cloNIDine (CATAPRES) 0.2 MG tablet Take 1 tablet (0.2 mg total) by mouth 2 (two) times daily. 60 tablet 1  . donepezil (ARICEPT) 5 MG tablet Take 1 tablet (5 mg total) by mouth at bedtime. 30 tablet 5  . lisinopril (PRINIVIL,ZESTRIL) 40 MG tablet Take 40 mg by mouth daily.    . potassium chloride SA (K-DUR,KLOR-CON) 20 MEQ tablet Take 1 tablet (20 mEq total) by mouth daily. 90 tablet 3    Home: Home Living Family/patient expects to be discharged to:: Unsure Living Arrangements: Spouse/significant other Available Help at Discharge: Family, Available 24 hours/day Type of Home: House Home Access: Level entry Home Layout: One level Additional Comments: Unsure of accuracy of info given pt's expressive aphasia and cognitive deficits. Some information given different than previous admission.   Functional History: Prior Function Level of Independence: Independent Comments: Pt reports she was independent Functional Status:  Mobility: Bed Mobility Overal bed mobility: Needs Assistance Bed Mobility: Supine to Sit, Sit to Supine Supine to sit: Min assist Sit to supine: Min assist General bed mobility comments: Min A for trunk elevation and scooting hips to EOB. Multiple verbal cues throughout to maintain NWB on R hand. Min A for LE assist for return back to bed.  Transfers Overall transfer  level: Needs assistance Equipment used: 1 person hand held assist Transfers: Sit to/from Stand Sit to Stand: Min assist General transfer comment: Min A for lift assist and steadying assist. Verbal cues not to use R hand to press up into standing.  Ambulation/Gait Ambulation/Gait assistance: Min assist Ambulation Distance (Feet): 10 Feet Assistive device: 1 person hand held assist Gait Pattern/deviations: Step-through pattern, Decreased stride length General Gait Details: Pt unsteady during gait with coordination deficits noted in RLE during RLE advancement. Required min A throughout for steadying during gait.  Gait velocity: Decreased  Gait velocity interpretation: Below normal speed for age/gender    ADL:    Cognition: Cognition Overall Cognitive Status: No family/caregiver present to determine baseline cognitive functioning Orientation Level: Oriented X4 Cognition Arousal/Alertness: Awake/alert Behavior During Therapy: WFL for tasks assessed/performed Overall Cognitive Status: No family/caregiver present to determine baseline cognitive functioning General Comments: Pt with previous stroke so unsure of cognitive deficits are new. Pt with slowed motor processing and required verbal and tactile cues to perform tasks.   Blood pressure (!) 107/51, pulse 71, temperature 98.9 F (37.2 C), temperature source Oral, resp. rate 18, height 5\' 4"  (1.626 m), weight 43.1 kg (95  lb), SpO2 100 %. Physical Exam  Constitutional: She is oriented to person, place, and time. She appears well-developed.  Frail  HENT:  Head: Normocephalic and atraumatic.  Eyes: EOM are normal. Right eye exhibits no discharge. Left eye exhibits no discharge.  Neck: Normal range of motion. Neck supple.  Cardiovascular: Normal rate and regular rhythm.  Respiratory: Effort normal and breath sounds normal.  GI: Soft. Bowel sounds are normal.  Musculoskeletal:  No edema or tenderness in extremities  Neurological: She  is alert and oriented to person, place, and time.  Moderate to severe dysarthria Expressive aphasia Motor: LUE/LLE: 5/5 proximal to distal RUE/RLE: 4/5 (hand grip limited due to taping) Sensation intact to light touch  Skin: Skin is warm and dry.  Psychiatric: She has a normal mood and affect. Her behavior is normal.    Results for orders placed or performed during the hospital encounter of 03/11/17 (from the past 24 hour(s))  Urinalysis, Routine w reflex microscopic     Status: Abnormal   Collection Time: 03/12/17  3:04 AM  Result Value Ref Range   Color, Urine YELLOW YELLOW   APPearance HAZY (A) CLEAR   Specific Gravity, Urine 1.026 1.005 - 1.030   pH 5.0 5.0 - 8.0   Glucose, UA NEGATIVE NEGATIVE mg/dL   Hgb urine dipstick SMALL (A) NEGATIVE   Bilirubin Urine NEGATIVE NEGATIVE   Ketones, ur 20 (A) NEGATIVE mg/dL   Protein, ur NEGATIVE NEGATIVE mg/dL   Nitrite NEGATIVE NEGATIVE   Leukocytes, UA NEGATIVE NEGATIVE   RBC / HPF 0-5 0 - 5 RBC/hpf   WBC, UA 0-5 0 - 5 WBC/hpf   Bacteria, UA NONE SEEN NONE SEEN   Squamous Epithelial / LPF 0-5 (A) NONE SEEN   Ct Angio Head W Or Wo Contrast  Result Date: 03/11/2017 CLINICAL DATA:  Initial evaluation for acute speech difficulty, right hand swelling. EXAM: CT ANGIOGRAPHY HEAD AND NECK TECHNIQUE: Multidetector CT imaging of the head and neck was performed using the standard protocol during bolus administration of intravenous contrast. Multiplanar CT image reconstructions and MIPs were obtained to evaluate the vascular anatomy. Carotid stenosis measurements (when applicable) are obtained utilizing NASCET criteria, using the distal internal carotid diameter as the denominator. CONTRAST:  50mL ISOVUE-370 IOPAMIDOL (ISOVUE-370) INJECTION 76% COMPARISON:  Prior CT from 03/10/2016 as well as MRI from earlier the same day. FINDINGS: CTA NECK FINDINGS Aortic arch: Visualized aortic arch of normal caliber with normal branch pattern. Moderate calcified  plaque within the arch itself and about the origin of the great vessels without hemodynamically significant stenosis. Extensive atheromatous plaque throughout the visualized subclavian arteries without high-grade stenosis. Right carotid system: Multifocal plaque throughout the right common carotid artery without high-grade stenosis. A centric calcified plaque about the right carotid bifurcation and proximal right ICA. There is a focal short-segment greater than 80% stenosis at the proximal right ICA (series 7, image 179). Right ICA tortuous but otherwise patent to the skull base without additional stenosis, dissection, or occlusion. Left carotid system: Multifocal plaque throughout the left common carotid artery without flow-limiting stenosis. Calcified atheromatous plaque about the proximal left ICA with associated relatively mild stenosis of 25-30%. Left ICA patent distally to the skull base without stenosis, dissection, or occlusion. Vertebral arteries: Both of the vertebral arteries arise from the subclavian arteries. Right vertebral artery occludes at its origin. Minimal scant distal reconstitution at the level of C2, likely via the muscular cutaneous branches (series 7, image 181). There is minimal thready flow distally into  the skull base. Focal plaque at the origin of the left vertebral artery with severe ostial stenosis. Mild narrowing at the level of C4-5 and C5-6 due to extrinsic compression from uncovertebral disease. Left vertebral artery otherwise widely patent. Skeleton: No acute osseous abnormality. No worrisome lytic or blastic osseous lesions. Moderate cervical spondylolysis at C4-5 through C6-7. Median sternotomy wires noted. Other neck: No acute soft tissue abnormality within the neck. Salivary glands normal. Thyroid normal. No adenopathy. Upper chest: Visualized upper chest demonstrates no acute abnormality. Centrilobular emphysema. Partially visualized lungs are clear. Review of the MIP images  confirms the above findings CTA HEAD FINDINGS Anterior circulation: Petrous segments patent bilaterally. Calcified atheromatous plaque throughout the cavernous/supraclinoid segments bilaterally. Associated moderate to severe multifocal narrowing, right worse than left. ICA termini patent. A1 segments patent bilaterally. Right A1 segment slightly hypoplastic. Patent anterior communicating artery. Anterior cerebral arteries patent to their distal aspects without flow-limiting stenosis. M1 segments mildly irregular but patent without high-grade stenosis. No proximal M2 occlusion. Distal MCA branches well opacified and fairly symmetric. Extensive distal small vessel atheromatous irregularity. Posterior circulation: Dominant left vertebral artery widely patent to the vertebrobasilar junction without flow-limiting stenosis. Patent left PICA. Minimal scant flow within the right V4 segment, nearly occluded. Filling of the right PICA, likely via retrograde flow across the vertebrobasilar junction. Basilar artery patent proximally. Mild to moderate diffuse narrowing of the mid-distal basilar artery. Superior cerebral arteries patent bilaterally. Left PCA supplied via the basilar and is patent to its distal aspect. Hypoplastic right P1 with prominent right posterior communicating artery. Mild to moderate right P2 stenosis (series 8, image 97). Right PCA patent distally to its distal aspects. Venous sinuses: Patent. Anatomic variants: None significant. No aneurysm or vascular abnormality. Delayed phase: No abnormal enhancement. Review of the MIP images confirms the above findings IMPRESSION: 1. Negative CTA for large vessel occlusion. 2. Severe greater than 80% short-segment proximal right ICA stenosis. 3. Extensive atherosclerotic change throughout the left carotid artery system without hemodynamically significant stenosis. Occlusion of the right vertebral artery at its origin, minimal scant thready distal reconstitution as  above. 4. Severe stenosis at the origin of the left vertebral artery. Dominant left vertebral artery otherwise widely patent. 5. Extensive intracranial atherosclerosis as above, most notable within the carotid siphons and basilar artery. 6. Emphysema. Electronically Signed   By: Rise Mu M.D.   On: 03/11/2017 06:25   Dg Wrist Complete Right  Result Date: 03/10/2017 CLINICAL DATA:  RIGHT hand pain, stroke EXAM: RIGHT WRIST - COMPLETE 3+ VIEW COMPARISON:  None FINDINGS: Diffuse osseous demineralization. Joint spaces preserved. No acute fracture, dislocation, or bone destruction. IMPRESSION: No acute osseous abnormalities. Electronically Signed   By: Ulyses Southward M.D.   On: 03/10/2017 20:32   Ct Head Wo Contrast  Result Date: 03/10/2017 CLINICAL DATA:  Garbled speech and confusion EXAM: CT HEAD WITHOUT CONTRAST TECHNIQUE: Contiguous axial images were obtained from the base of the skull through the vertex without intravenous contrast. COMPARISON:  MRI brain 08/14/2012, CT brain 08/13/2012 FINDINGS: Brain: No acute territorial infarction, or hemorrhage is visualized. Possible small 6 mm nodule or mass near the foramen of Monro. Encephalomalacia in the left parietal, posterior temporal and left occipital lobes consistent with old infarct. Mild atrophy. Ex vacuo dilatation of left lateral ventricle. Vascular: No hyperdense vessel.  Carotid vascular calcification. Skull: No fracture or suspicious lesion Sinuses/Orbits: No acute finding. Other: None IMPRESSION: 1. No definite CT evidence for acute intracranial abnormality. Old left parietal, posterior temporal and  occipital infarcts. 2. Suspected 6 mm isodense nodule near the foramen of Monro, new since 2014, possible colloid nodule, however given isodense appearance and new finding suggest further evaluation with nonemergent MRI. Electronically Signed   By: Jasmine Pang M.D.   On: 03/10/2017 20:08   Ct Angio Neck W Or Wo Contrast  Result Date:  03/11/2017 CLINICAL DATA:  Initial evaluation for acute speech difficulty, right hand swelling. EXAM: CT ANGIOGRAPHY HEAD AND NECK TECHNIQUE: Multidetector CT imaging of the head and neck was performed using the standard protocol during bolus administration of intravenous contrast. Multiplanar CT image reconstructions and MIPs were obtained to evaluate the vascular anatomy. Carotid stenosis measurements (when applicable) are obtained utilizing NASCET criteria, using the distal internal carotid diameter as the denominator. CONTRAST:  50mL ISOVUE-370 IOPAMIDOL (ISOVUE-370) INJECTION 76% COMPARISON:  Prior CT from 03/10/2016 as well as MRI from earlier the same day. FINDINGS: CTA NECK FINDINGS Aortic arch: Visualized aortic arch of normal caliber with normal branch pattern. Moderate calcified plaque within the arch itself and about the origin of the great vessels without hemodynamically significant stenosis. Extensive atheromatous plaque throughout the visualized subclavian arteries without high-grade stenosis. Right carotid system: Multifocal plaque throughout the right common carotid artery without high-grade stenosis. A centric calcified plaque about the right carotid bifurcation and proximal right ICA. There is a focal short-segment greater than 80% stenosis at the proximal right ICA (series 7, image 179). Right ICA tortuous but otherwise patent to the skull base without additional stenosis, dissection, or occlusion. Left carotid system: Multifocal plaque throughout the left common carotid artery without flow-limiting stenosis. Calcified atheromatous plaque about the proximal left ICA with associated relatively mild stenosis of 25-30%. Left ICA patent distally to the skull base without stenosis, dissection, or occlusion. Vertebral arteries: Both of the vertebral arteries arise from the subclavian arteries. Right vertebral artery occludes at its origin. Minimal scant distal reconstitution at the level of C2, likely  via the muscular cutaneous branches (series 7, image 181). There is minimal thready flow distally into the skull base. Focal plaque at the origin of the left vertebral artery with severe ostial stenosis. Mild narrowing at the level of C4-5 and C5-6 due to extrinsic compression from uncovertebral disease. Left vertebral artery otherwise widely patent. Skeleton: No acute osseous abnormality. No worrisome lytic or blastic osseous lesions. Moderate cervical spondylolysis at C4-5 through C6-7. Median sternotomy wires noted. Other neck: No acute soft tissue abnormality within the neck. Salivary glands normal. Thyroid normal. No adenopathy. Upper chest: Visualized upper chest demonstrates no acute abnormality. Centrilobular emphysema. Partially visualized lungs are clear. Review of the MIP images confirms the above findings CTA HEAD FINDINGS Anterior circulation: Petrous segments patent bilaterally. Calcified atheromatous plaque throughout the cavernous/supraclinoid segments bilaterally. Associated moderate to severe multifocal narrowing, right worse than left. ICA termini patent. A1 segments patent bilaterally. Right A1 segment slightly hypoplastic. Patent anterior communicating artery. Anterior cerebral arteries patent to their distal aspects without flow-limiting stenosis. M1 segments mildly irregular but patent without high-grade stenosis. No proximal M2 occlusion. Distal MCA branches well opacified and fairly symmetric. Extensive distal small vessel atheromatous irregularity. Posterior circulation: Dominant left vertebral artery widely patent to the vertebrobasilar junction without flow-limiting stenosis. Patent left PICA. Minimal scant flow within the right V4 segment, nearly occluded. Filling of the right PICA, likely via retrograde flow across the vertebrobasilar junction. Basilar artery patent proximally. Mild to moderate diffuse narrowing of the mid-distal basilar artery. Superior cerebral arteries patent  bilaterally. Left PCA supplied via the  basilar and is patent to its distal aspect. Hypoplastic right P1 with prominent right posterior communicating artery. Mild to moderate right P2 stenosis (series 8, image 97). Right PCA patent distally to its distal aspects. Venous sinuses: Patent. Anatomic variants: None significant. No aneurysm or vascular abnormality. Delayed phase: No abnormal enhancement. Review of the MIP images confirms the above findings IMPRESSION: 1. Negative CTA for large vessel occlusion. 2. Severe greater than 80% short-segment proximal right ICA stenosis. 3. Extensive atherosclerotic change throughout the left carotid artery system without hemodynamically significant stenosis. Occlusion of the right vertebral artery at its origin, minimal scant thready distal reconstitution as above. 4. Severe stenosis at the origin of the left vertebral artery. Dominant left vertebral artery otherwise widely patent. 5. Extensive intracranial atherosclerosis as above, most notable within the carotid siphons and basilar artery. 6. Emphysema. Electronically Signed   By: Rise Mu M.D.   On: 03/11/2017 06:25   Mr Brain Wo Contrast  Result Date: 03/11/2017 CLINICAL DATA:  Initial evaluation for acute speech difficulty, right hand abnormality. EXAM: MRI HEAD WITHOUT CONTRAST TECHNIQUE: Multiplanar, multiecho pulse sequences of the brain and surrounding structures were obtained without intravenous contrast. COMPARISON:  Prior CT from 03/10/2017. FINDINGS: Brain: Diffuse prominence of the CSF containing spaces compatible with generalized cerebral atrophy. Minimal chronic microvascular ischemic changes for age. Encephalomalacia with gliosis within the left temporal occipital region consistent with remote left PCA territory infarct. Associated lacunar infarct within the left thalamus. Additional moderate-sized remote left posterior MCA territory infarct. Associated volume loss with ex vacuo dilatation of the  left lateral ventricle. There are patchy multifocal small volume acute ischemic predominantly cortical infarcts involving the left frontoparietal region (series 3, images 29-39). No significant mass effect. No associated hemorrhage. No other evidence for acute or subacute ischemia. No mass lesion, midline shift or mass effect. No hydrocephalus. No extra-axial fluid collection. Major dural sinuses are grossly patent. Pituitary gland suprasellar region normal. Midline structures intact and normal. Previously question nodular density near the foramen of Monro felt to be related to patient positioning. Vascular: Major intravascular flow voids are maintained at the skull base. Skull and upper cervical spine: Craniocervical junction normal. Upper cervical spine within normal limits. Bone marrow signal intensity within normal limits. No scalp soft tissue abnormality. Sinuses/Orbits: Globes and orbital soft tissues within normal limits. Paranasal sinuses are largely clear. No significant mastoid effusion. Inner ear structures grossly normal. Other: None. IMPRESSION: 1. Patchy small volume acute ischemic nonhemorrhagic left MCA territory cortical infarcts involving the left frontal and parietal lobes as above. 2. Moderate to large-sized remote left posterior MCA and PCA territory infarcts. 3. Age-related cerebral atrophy. Electronically Signed   By: Rise Mu M.D.   On: 03/11/2017 01:35   Dg Hand Complete Right  Result Date: 03/10/2017 CLINICAL DATA:  Hand pain, history of stroke EXAM: RIGHT HAND - COMPLETE 3+ VIEW COMPARISON:  None FINDINGS: Diffuse osseous demineralization. Transverse fracture at base of proximal phalanx RIGHT little finger with displacement, angulation, and deformity. Joint spaces preserved. No additional fracture, dislocation, or bone destruction. Dorsal soft tissue swelling overlying the MCP joints. IMPRESSION: Displaced and angulated fracture at base of proximal phalanx RIGHT little  finger. Electronically Signed   By: Ulyses Southward M.D.   On: 03/10/2017 20:31   Dg Finger Little Right  Result Date: 03/11/2017 CLINICAL DATA:  Status post reduction of right fifth proximal phalanx fracture. Initial encounter. EXAM: RIGHT LITTLE FINGER 2+V COMPARISON:  None. FINDINGS: There is mildly improved alignment of the  fracture through the base of the fifth proximal phalanx, with residual radial and volar displacement. Surrounding soft tissue swelling is noted. No new fractures are seen. IMPRESSION: Mildly improved alignment of the fracture through the base of the fifth proximal phalanx, with residual radial and volar displacement. Electronically Signed   By: Roanna Raider M.D.   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  Records reviewed and summated above. Stroke: Continue secondary stroke prophylaxis and Risk Factor Modification listed below:   Antiplatelet therapy:   Blood Pressure Management:  Continue current medication with prn's with permisive HTN per primary team Statin Agent:   Tobacco abuse:   Motor recovery: Fluoxetine  1. Does the need for close, 24 hr/day medical supervision in concert with the patient's rehab needs make it unreasonable for this patient to be served in a less intensive setting? Yes  2. Co-Morbidities requiring supervision/potential complications: right proximal fifth phalanx (consider bracing), acute blood loss anemia (transfuse if necessary to ensure appropriate perfusion for increased activity tolerance), hypokalemia (continue to monitor, supplement as necessary), tobacco abuse (counsel), CVA with residual right-sided weakness, PAF (continue meds, monitor with increased mobility), STEMI status post CABG, remote history of seizures (consider meds if necessary), HTN (monitor and provide prns in accordance with increased physical exertion and pain), ?dementia (continue  meds for now) 3. Due to safety, disease management and patient education, does the patient require 24 hr/day rehab nursing? Yes 4. Does the patient require coordinated care of a physician, rehab nurse, PT (1-2 hrs/day, 5 days/week), OT (1-2 hrs/day, 5 days/week) and SLP (1-2 hrs/day, 5 days/week) to address physical and functional deficits in the context of the above medical diagnosis(es)? Yes Addressing deficits in the following areas: balance, endurance, locomotion, strength, transferring, bathing, dressing, toileting, cognition, speech and psychosocial support 5. Can the patient actively participate in an intensive therapy program of at least 3 hrs of therapy per day at least 5 days per week? Yes 6. The potential for patient to make measurable gains while on inpatient rehab is excellent 7. Anticipated functional outcomes upon discharge from inpatient rehab are modified independent and supervision  with PT, modified independent and supervision with OT, modified independent and supervision with SLP. 8. Estimated rehab length of stay to reach the above functional goals is: 14-18 days. 9. Anticipated D/C setting: Home 10. Anticipated post D/C treatments: HH therapy and Home excercise program 11. Overall Rehab/Functional Prognosis: good  RECOMMENDATIONS: This patient's condition is appropriate for continued rehabilitative care in the following setting: CIR after completion of medical workup. Patient has agreed to participate in recommended program. Yes Note that insurance prior authorization may be required for reimbursement for recommended care.  Comment: Rehab Admissions Coordinator to follow up.  Maryla Morrow, MD, Evert Kohl 03/12/2017

## 2017-03-12 NOTE — Evaluation (Signed)
Speech Language Pathology Evaluation Patient Details Name: [name] [name] MRN: [id number] DOB: 01/15/1951 Today's Date: 03/12/2017 Time: 1050-1110 SLP Time Calculation (min) (ACUTE ONLY): 20 min  Problem List:  Patient Active Problem List   Diagnosis Date Noted  . Chronic ischemic left MCA stroke   . Acute ischemic left middle cerebral artery (MCA) stroke (HCC)   . Acute blood loss anemia   . Hypokalemia   . History of CVA with residual deficit   . Dysarthria, post-stroke   . Expressive aphasia   . History of ST elevation myocardial infarction (STEMI)   . Benign essential HTN   . History of seizure   . History of memory loss   . CAD (coronary artery disease) 03/11/2017  . Displaced fracture of proximal phalanx of left little finger, initial encounter for closed fracture 03/11/2017  . Atrial fibrillation (HCC) 10/19/2013  . 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 Aphasia as late effect of cerebrovascular accident 09/25/2012  . Embolic cerebral infarction (HCC) 08/16/2012  . Acute ischemic left MCA stroke (HCC) 08/13/2012  . 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 07/25/12 07/28/2012  . Hyperlipidemia LDL goal < 70 07/24/2012  . STEMI (ST elevation myocardial infarction), secondary to disease of RCA, with disease of LAD and diag. branch and chronic non dominant LCX occlusion for CABG  07/21/2012  . Essential hypertension 07/21/2012  . Tobacco abuse 07/21/2012   Past Medical History:  Past Medical History:  Diagnosis Date  . Acute ischemic left MCA stroke (HCC) 03/11/2017  . Hyperlipidemia LDL goal < 70 07/24/2012  . Hypertension   . 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 07/25/12 07/28/2012   POSTOPERATIVE DIAGNOSIS: Severe 3-vessel coronary disease, status post  myocardial infarction.  PROCEDURE: Median sternotomy, extracorporeal circulation, coronary   artery bypass grafting x5 (left internal mammary artery to LAD,  sequential saphenous vein graft to obtuse marginal 1, obtuse marginal 2,  and posterior descending with Y graft to 1st diagonal), endoscopic vein  harvest, left thigh.    . Seizures (HCC)    "years ago" (03/11/2017)  . STEMI (ST elevation myocardial infarction), secondary to disease of RCA, with disease of LAD and diag. branch and chronic non dominant LCX occlusion for CABG  07/21/2012  . Stroke [facility] ?08/2012   "right sided weakness; speech isssues since" (03/11/2017)  . Tobacco abuse 07/21/2012   Past Surgical History:  Past Surgical History:  Procedure Laterality Date  . CARDIAC CATHETERIZATION    . CESAREAN SECTION  X 1  . CORONARY ARTERY BYPASS GRAFT N/A 07/25/2012   Procedure: CORONARY ARTERY BYPASS GRAFTING (CABG) times five with Left  Endoscopic Saphenous Vein Harvest and Left Internal  Mammary ;  Surgeon: Loreli Slot, MD;  Location: [facility] OR;  Service: Open Heart Surgery;  Laterality: N/A;  . DILATION AND CURETTAGE OF UTERUS    . 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 LEFT HEART CATHETERIZATION WITH CORONARY ANGIOGRAM N/A 07/21/2012   Procedure: LEFT HEART CATHETERIZATION WITH CORONARY ANGIOGRAM;  Surgeon: [name], MD;  Location: [facility] CATH LAB;  Service: Cardiovascular;  Laterality: N/A;  . LOOP RECORDER IMPLANT  08-15-2012   Medtronic LinQ implanted by Dr Johney Frame for cryptogenic stroke  . TEE WITHOUT CARDIOVERSION N/A 08/15/2012   Procedure: TRANSESOPHAGEAL ECHOCARDIOGRAM (TEE);  Surgeon: Wendall Stade, MD;  Location: [facility] ENDOSCOPY;  Service: Cardiovascular;  Laterality: N/A;  . TUBAL LIGATION  HPI:  Patient is a 67 y.o. female who presented to the ER with right facial drooping, difficulty talking and pain in her right hand. PMH: atrial fibrillation, HTN, hyperlipidemia, CABG, tobacco abuse, CVA affecting left MCA with residual mild aphasia, right-sided weakness and sensory loss.   Assessment / Plan /  Recommendation Clinical Impression  Patient presents with a mod-severe expressive aphasia but with fairly intact receptive language and cognitive abilities. Patient has history of CVA with residual expressive aphasia and so difficult to determine if she is at her baseline. Patient indicated that the stroke "5 years ago" didn't leave her with significant impairments, but that this current stroke has significantly impacted her physical movement (right side) and her expressive language. Patient was accurate with yes/no questions and when given two-choice cues for naming and answering questions. She is aware of her errors, and when completing confrontational naming task of objects, she was able to figure out name with extra time for 3/4 that she had difficulty naming initially. Patient speaks at single word and fragmented phrases, but is able to effectively communicate basic needs/wants/thoughts as well as answer questions by combination of verbal and gestural means. Patient would greatly benefit from speech-language therapy at inpatient rehab (CIR) setting, and recommendation has already been made by PT.     SLP Assessment  SLP Recommendation/Assessment: Patient needs continued Speech Lanaguage Pathology Services SLP Visit Diagnosis: Aphasia (R47.01)    Follow Up Recommendations  Inpatient Rehab    Frequency and Duration min 2x/week  1 week      SLP Evaluation Cognition  Overall Cognitive Status: No family/caregiver present to determine baseline cognitive functioning Arousal/Alertness: Awake/alert Orientation Level: Oriented X4 Attention: Selective Selective Attention: Appears intact Selective Attention Impairment: Verbal basic;Functional basic Memory: Appears intact Awareness: Appears intact Problem Solving: Appears intact Safety/Judgment: Appears intact       Comprehension  Auditory Comprehension Yes/No Questions: Within Functional Limits Conversation: Simple Interfering Components:  Other (comment)(expressive aphasia impacts her ability to respond to more complex comprehension questions)    Expression Expression Primary Mode of Expression: Verbal Verbal Expression Overall Verbal Expression: Impaired Initiation: No impairment Automatic Speech: Name Level of Generative/Spontaneous Verbalization: Word;Phrase Repetition: No impairment Naming: Impairment Responsive: 26-50% accurate Confrontation: Impaired Verbal Errors: Aware of errors Pragmatics: No impairment Interfering Components: Premorbid deficit Effective Techniques: Phonemic cues;Semantic cues;Other (Comment)(two-choice cues) Non-Verbal Means of Communication: Not applicable Written Expression Dominant Hand: Right   Oral / Motor  Oral Motor/Sensory Function Overall Oral Motor/Sensory Function: Mild impairment Facial ROM: Reduced right Facial Symmetry: Abnormal symmetry right Facial Strength: Reduced right Facial Sensation: Reduced right Lingual ROM: Reduced right Lingual Symmetry: Abnormal symmetry left Lingual Strength: Within Functional Limits Lingual Sensation: Within Functional Limits Velum: Within Functional Limits Mandible: Within Functional Limits Motor Speech Phonation: Normal Resonance: Within functional limits Articulation: Within functional limitis Intelligibility: Intelligible Motor Planning: Witnin functional limits   GO                   Angela Nevin, MA, CCC-SLP 03/12/17 3:49 PM

## 2017-03-12 NOTE — Evaluation (Signed)
Clinical/Bedside Swallow Evaluation [name]: [id number] Date of Birth: 28-Jul-1950  Today's Date: 03/12/2017 Time: SLP Start Time (ACUTE ONLY): 1035 SLP Stop Time (ACUTE ONLY): 1045 SLP Time Calculation (min) (ACUTE ONLY): 10 min  Past Medical History:  Past Medical History:  Diagnosis Date  . Acute ischemic left MCA stroke (HCC) 03/11/2017  . Hyperlipidemia LDL goal < 70 07/24/2012  . Hypertension   . 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 07/25/12 07/28/2012   POSTOPERATIVE DIAGNOSIS: Severe 3-vessel coronary disease, status post  myocardial infarction.  PROCEDURE: Median sternotomy, extracorporeal circulation, coronary  artery bypass grafting x5 (left internal mammary artery to LAD,  sequential saphenous vein graft to obtuse marginal 1, obtuse marginal 2,  and posterior descending with Y graft to 1st diagonal), endoscopic vein  harvest, left thigh.    . Seizures (HCC)    "years ago" (03/11/2017)  . STEMI (ST elevation myocardial infarction), secondary to disease of RCA, with disease of LAD and diag. branch and chronic non dominant LCX occlusion for CABG  07/21/2012  . Stroke [facility]) ?08/2012   "right sided weakness; speech isssues since" (03/11/2017)  . Tobacco abuse 07/21/2012   Past Surgical History:  Past Surgical History:  Procedure Laterality Date  . CARDIAC CATHETERIZATION    . CESAREAN SECTION  X 1  . CORONARY ARTERY BYPASS GRAFT N/A 07/25/2012   Procedure: CORONARY ARTERY BYPASS GRAFTING (CABG) times five with Left  Endoscopic Saphenous Vein Harvest and Left Internal  Mammary ;  Surgeon: Loreli Slot, MD;  Location: [facility] OR;  Service: Open Heart Surgery;  Laterality: N/A;  . DILATION AND CURETTAGE OF UTERUS    . 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 LEFT HEART CATHETERIZATION WITH CORONARY ANGIOGRAM N/A 07/21/2012   Procedure: LEFT HEART CATHETERIZATION WITH CORONARY ANGIOGRAM;  Surgeon: [name], MD;  Location: [facility] CATH  LAB;  Service: Cardiovascular;  Laterality: N/A;  . LOOP RECORDER IMPLANT  08-15-2012   Medtronic LinQ implanted by Dr Johney Frame for cryptogenic stroke  . TEE WITHOUT CARDIOVERSION N/A 08/15/2012   Procedure: TRANSESOPHAGEAL ECHOCARDIOGRAM (TEE);  Surgeon: Wendall Stade, MD;  Location: [facility] ENDOSCOPY;  Service: Cardiovascular;  Laterality: N/A;  . TUBAL LIGATION     HPI:  Patient is a 67 y.o. female who presented to the ER with right facial drooping, difficulty talking and pain in her right hand. PMH: atrial fibrillation, HTN, hyperlipidemia, CABG, tobacco abuse, CVA affecting left MCA with residual mild aphasia, right-sided weakness and sensory loss.   Assessment / Plan / Recommendation Clinical Impression  Patient presents with a mild oropharyngeal dysphagia without overt s/s of aspiration or penetration, and primary difficulty being mild decreased mastication and delays in oral transit of solid textures. Swallow initiation was timely, pharyngeal contraction was adequate and patient did not exhibit any oral residuals post swallow.  SLP Visit Diagnosis: Dysphagia, oropharyngeal phase (R13.12)    Aspiration Risk  No limitations    Diet Recommendation Thin liquid;Regular   Liquid Administration via: Cup;Straw Medication Administration: Whole meds with liquid Supervision: Patient able to self feed;Comment(assist with set up of meal tray) Compensations: Minimize environmental distractions;Small sips/bites Postural Changes: Seated upright at 90 degrees    Other  Recommendations Oral Care Recommendations: Oral care BID   Follow up Recommendations Inpatient Rehab      Frequency and Duration   N/A         Prognosis   N/A     Swallow Study  General Date of Onset: 03/11/17 HPI: Patient is a 67 y.o. female who presented to the ER with right facial drooping, difficulty talking and pain in her right hand. PMH: atrial fibrillation, HTN, hyperlipidemia, CABG, tobacco abuse, CVA affecting left  MCA with residual mild aphasia, right-sided weakness and sensory loss. Type of Study: Bedside Swallow Evaluation Previous Swallow Assessment: N/A Diet Prior to this Study: NPO Temperature Spikes Noted: No Respiratory Status: Room air History of Recent Intubation: No Behavior/Cognition: Alert;Cooperative;Pleasant mood Oral Cavity Assessment: Within Functional Limits Oral Care Completed by SLP: No Oral Cavity - Dentition: Missing dentition;Adequate natural dentition Self-Feeding Abilities: Able to feed self Patient Positioning: Upright in bed Baseline Vocal Quality: Normal Volitional Cough: Other (Comment)(unable to elicit) Volitional Swallow: Able to elicit    Oral/Motor/Sensory Function Overall Oral Motor/Sensory Function: Mild impairment Facial ROM: Reduced right Facial Symmetry: Abnormal symmetry right Facial Strength: Reduced right Facial Sensation: Reduced right Lingual ROM: Reduced right Lingual Symmetry: Abnormal symmetry left Lingual Strength: Within Functional Limits Lingual Sensation: Within Functional Limits Velum: Within Functional Limits Mandible: Within Functional Limits   Ice Chips Ice chips: Not tested   Thin Liquid Thin Liquid: Within functional limits Presentation: Cup;Self Fed;Straw Other Comments: Timely swallow initiation, no overt s/s of aspiration or penetration.    Nectar Thick Nectar Thick Liquid: Not tested   Honey Thick Honey Thick Liquid: Not tested   Puree Puree: Within functional limits Presentation: Self Fed;Spoon Other Comments: No overt s/s of aspiration or penetration, no pocketing or holding of bolus in oral cavity.   Solid   GO   Solid: Impaired Oral Phase Impairments: Impaired mastication Oral Phase Functional Implications: Prolonged oral transit Other Comments: Patient exhibited mild delays in mastication and oral transit of solid texture bolus but did not exhibit any pharyngeal difficulties, no overt s/s of aspiration or penetration,  and no oral residuals post swallow.         Angela Nevin, MA, CCC-SLP 03/12/17 3:30 PM

## 2017-03-12 NOTE — Progress Notes (Signed)
PROGRESS NOTE [facility]   SALVATRICE MORANDI   UJW:[id number] DOB: 10/25/1950  DOA: 03/11/2017 PCP: Patient, No Pcp Per   Brief Narrative:  [name] [name] is a 67 year old female with past medical history significant for prior CVA, CAD status post CABG, hypertension, PAF on Eliquis who presented to the emergency department complaining of slurred speech and facial droop.  Upon ED evaluation MRI of the brain revealed patchy  acute ischemic nonhemorrhagic left MCA infarct.  Patient was admitted for neurological evaluation.  Patient also sustained a fall hurting her right hand x-rays was taken and showed displaced fracture of the 5th proximal phalanx, this was splinted in the ED.  Physical therapy evaluated patient and recommended inpatient rehabilitation.  Subjective: Patient seen and examined feeling okay no new complaints.   Assessment & Plan: Acute ischemic left MCA infarct Felt to be secondary to A. fib with noncompliant with anticoagulation. Resultant expressive aphasia with right-sided weakness Echocardiogram and carotid Doppler unrevealing Lipid panel and A1c unremarkable Resume Eliquis, continue Lipitor Neurology recommendations appreciated PT recommending inpatient rehab  PAF Currently in sinus rhythm Has not been compliant with Eliquis Resume Eliquis, encourage compliance Can DC telemetry  CAD Asymptomatic Continue Lipitor  Displaced fracture left fifth proximal phalanx Status post reduction and splinting in the ED Supportive treatment  Hypertension Allowing permissive hypertension Can resume BP medication in the next 24-48 hours Monitor BP closely  Hypokalemia/hypomagnesemia Replete, check BMP and mag in the morning  DVT prophylaxis: Eliquis  Code Status: Full code  Family Communication: None at bedside  Disposition Plan: CIR when bed available   Consultants:   Neurology  Procedures:   Echo  Antimicrobials:  None   Objective: Vitals:   03/12/17 0100 03/12/17 0210 03/12/17 0529 03/12/17 1048  BP: 120/60 121/71 (!) 141/79 (!) 107/51  Pulse: (!) 59 76 69 71  Resp: 18 18 18 18   Temp: 98.3 F (36.8 C) 98.4 F (36.9 C) 98.2 F (36.8 C) 98.9 F (37.2 C)  TempSrc: Oral Oral Oral Oral  SpO2: 100% 100% 100% 100%  Weight:      Height:        Intake/Output Summary (Last 24 hours) at 03/12/2017 1546 Last data filed at 03/12/2017 0241 Gross per 24 hour  Intake -  Output 400 ml  Net -400 ml   Filed Weights   03/10/17 1928  Weight: 43.1 kg (95 lb)    Examination:  General exam: Appears calm and comfortable HEENT: PERRLA, OP moist and clear Respiratory system: Clear to auscultation. No wheezes,crackle or rhonchi Cardiovascular system: S1 & S2 heard, RRR. No JVD, murmurs, rubs or gallops Gastrointestinal system: Abdomen is nondistended, soft and nontender.  Central nervous system: Alert and oriented.  Expressive aphasia, mild right weakness Extremities: No pedal edema, strength 4/5 on the right.  Right fifth finger splinted Skin: No rashes, lesions or ulcers Psychiatry:  Mood & affect appropriate.    Data Reviewed: I have personally reviewed following labs and imaging studies  CBC: Recent Labs  Lab 03/10/17 1932 03/11/17 0447  WBC 6.1 6.0  NEUTROABS 2.8  --   HGB 13.5 11.9*  HCT 40.1 35.9*  MCV 93.5 92.8  PLT 249 230   Basic Metabolic Panel: Recent Labs  Lab 03/10/17 1932 03/11/17 0447  NA 142 141  K 3.4* 3.3*  CL 106 106  CO2 23 23  GLUCOSE 85 82  BUN 10 9  CREATININE 0.85 0.79  CALCIUM 9.5 9.2  MG  --  1.6*  GFR: Estimated Creatinine Clearance: 45.8 mL/min (by C-G formula based on SCr of 0.79 mg/dL). Liver Function Tests: Recent Labs  Lab 03/10/17 1932  AST 32  ALT 24  ALKPHOS 71  BILITOT 0.5  PROT 7.1  ALBUMIN 4.0   No results for input(s): LIPASE, AMYLASE in the last 168 hours. No results for input(s): AMMONIA in the last 168 hours. Coagulation Profile: Recent Labs  Lab  03/10/17 1932  INR 0.95   Cardiac Enzymes: No results for input(s): CKTOTAL, CKMB, CKMBINDEX, TROPONINI in the last 168 hours. BNP (last 3 results) No results for input(s): PROBNP in the last 8760 hours. HbA1C: Recent Labs    03/11/17 0447  HGBA1C 5.6   CBG: No results for input(s): GLUCAP in the last 168 hours. Lipid Profile: Recent Labs    03/11/17 0447  CHOL 99  HDL 36*  LDLCALC 53  TRIG 48  CHOLHDL 2.8   Thyroid Function Tests: No results for input(s): TSH, T4TOTAL, FREET4, T3FREE, THYROIDAB in the last 72 hours. Anemia Panel: No results for input(s): VITAMINB12, FOLATE, FERRITIN, TIBC, IRON, RETICCTPCT in the last 72 hours. Sepsis Labs: No results for input(s): PROCALCITON, LATICACIDVEN in the last 168 hours.  No results found for this or any previous visit (from the past 240 hour(s)).    Radiology Studies: Ct Angio Head W Or Wo Contrast  Result Date: 03/11/2017 CLINICAL DATA:  Initial evaluation for acute speech difficulty, right hand swelling. EXAM: CT ANGIOGRAPHY HEAD AND NECK TECHNIQUE: Multidetector CT imaging of the head and neck was performed using the standard protocol during bolus administration of intravenous contrast. Multiplanar CT image reconstructions and MIPs were obtained to evaluate the vascular anatomy. Carotid stenosis measurements (when applicable) are obtained utilizing NASCET criteria, using the distal internal carotid diameter as the denominator. CONTRAST:  50mL ISOVUE-370 IOPAMIDOL (ISOVUE-370) INJECTION 76% COMPARISON:  Prior CT from 03/10/2016 as well as MRI from earlier the same day. FINDINGS: CTA NECK FINDINGS Aortic arch: Visualized aortic arch of normal caliber with normal branch pattern. Moderate calcified plaque within the arch itself and about the origin of the great vessels without hemodynamically significant stenosis. Extensive atheromatous plaque throughout the visualized subclavian arteries without high-grade stenosis. Right carotid  system: Multifocal plaque throughout the right common carotid artery without high-grade stenosis. A centric calcified plaque about the right carotid bifurcation and proximal right ICA. There is a focal short-segment greater than 80% stenosis at the proximal right ICA (series 7, image 179). Right ICA tortuous but otherwise patent to the skull base without additional stenosis, dissection, or occlusion. Left carotid system: Multifocal plaque throughout the left common carotid artery without flow-limiting stenosis. Calcified atheromatous plaque about the proximal left ICA with associated relatively mild stenosis of 25-30%. Left ICA patent distally to the skull base without stenosis, dissection, or occlusion. Vertebral arteries: Both of the vertebral arteries arise from the subclavian arteries. Right vertebral artery occludes at its origin. Minimal scant distal reconstitution at the level of C2, likely via the muscular cutaneous branches (series 7, image 181). There is minimal thready flow distally into the skull base. Focal plaque at the origin of the left vertebral artery with severe ostial stenosis. Mild narrowing at the level of C4-5 and C5-6 due to extrinsic compression from uncovertebral disease. Left vertebral artery otherwise widely patent. Skeleton: No acute osseous abnormality. No worrisome lytic or blastic osseous lesions. Moderate cervical spondylolysis at C4-5 through C6-7. Median sternotomy wires noted. Other neck: No acute soft tissue abnormality within the neck. Salivary glands normal.  Thyroid normal. No adenopathy. Upper chest: Visualized upper chest demonstrates no acute abnormality. Centrilobular emphysema. Partially visualized lungs are clear. Review of the MIP images confirms the above findings CTA HEAD FINDINGS Anterior circulation: Petrous segments patent bilaterally. Calcified atheromatous plaque throughout the cavernous/supraclinoid segments bilaterally. Associated moderate to severe multifocal  narrowing, right worse than left. ICA termini patent. A1 segments patent bilaterally. Right A1 segment slightly hypoplastic. Patent anterior communicating artery. Anterior cerebral arteries patent to their distal aspects without flow-limiting stenosis. M1 segments mildly irregular but patent without high-grade stenosis. No proximal M2 occlusion. Distal MCA branches well opacified and fairly symmetric. Extensive distal small vessel atheromatous irregularity. Posterior circulation: Dominant left vertebral artery widely patent to the vertebrobasilar junction without flow-limiting stenosis. Patent left PICA. Minimal scant flow within the right V4 segment, nearly occluded. Filling of the right PICA, likely via retrograde flow across the vertebrobasilar junction. Basilar artery patent proximally. Mild to moderate diffuse narrowing of the mid-distal basilar artery. Superior cerebral arteries patent bilaterally. Left PCA supplied via the basilar and is patent to its distal aspect. Hypoplastic right P1 with prominent right posterior communicating artery. Mild to moderate right P2 stenosis (series 8, image 97). Right PCA patent distally to its distal aspects. Venous sinuses: Patent. Anatomic variants: None significant. No aneurysm or vascular abnormality. Delayed phase: No abnormal enhancement. Review of the MIP images confirms the above findings IMPRESSION: 1. Negative CTA for large vessel occlusion. 2. Severe greater than 80% short-segment proximal right ICA stenosis. 3. Extensive atherosclerotic change throughout the left carotid artery system without hemodynamically significant stenosis. Occlusion of the right vertebral artery at its origin, minimal scant thready distal reconstitution as above. 4. Severe stenosis at the origin of the left vertebral artery. Dominant left vertebral artery otherwise widely patent. 5. Extensive intracranial atherosclerosis as above, most notable within the carotid siphons and basilar artery.  6. Emphysema. Electronically Signed   By: Rise Mu M.D.   On: 03/11/2017 06:25   Dg Wrist Complete Right  Result Date: 03/10/2017 CLINICAL DATA:  RIGHT hand pain, stroke EXAM: RIGHT WRIST - COMPLETE 3+ VIEW COMPARISON:  None FINDINGS: Diffuse osseous demineralization. Joint spaces preserved. No acute fracture, dislocation, or bone destruction. IMPRESSION: No acute osseous abnormalities. Electronically Signed   By: Ulyses Southward M.D.   On: 03/10/2017 20:32   Ct Head Wo Contrast  Result Date: 03/10/2017 CLINICAL DATA:  Garbled speech and confusion EXAM: CT HEAD WITHOUT CONTRAST TECHNIQUE: Contiguous axial images were obtained from the base of the skull through the vertex without intravenous contrast. COMPARISON:  MRI brain 08/14/2012, CT brain 08/13/2012 FINDINGS: Brain: No acute territorial infarction, or hemorrhage is visualized. Possible small 6 mm nodule or mass near the foramen of Monro. Encephalomalacia in the left parietal, posterior temporal and left occipital lobes consistent with old infarct. Mild atrophy. Ex vacuo dilatation of left lateral ventricle. Vascular: No hyperdense vessel.  Carotid vascular calcification. Skull: No fracture or suspicious lesion Sinuses/Orbits: No acute finding. Other: None IMPRESSION: 1. No definite CT evidence for acute intracranial abnormality. Old left parietal, posterior temporal and occipital infarcts. 2. Suspected 6 mm isodense nodule near the foramen of Monro, new since 2014, possible colloid nodule, however given isodense appearance and new finding suggest further evaluation with nonemergent MRI. Electronically Signed   By: Jasmine Pang M.D.   On: 03/10/2017 20:08   Ct Angio Neck W Or Wo Contrast  Result Date: 03/11/2017 CLINICAL DATA:  Initial evaluation for acute speech difficulty, right hand swelling. EXAM: CT ANGIOGRAPHY HEAD  AND NECK TECHNIQUE: Multidetector CT imaging of the head and neck was performed using the standard protocol during bolus  administration of intravenous contrast. Multiplanar CT image reconstructions and MIPs were obtained to evaluate the vascular anatomy. Carotid stenosis measurements (when applicable) are obtained utilizing NASCET criteria, using the distal internal carotid diameter as the denominator. CONTRAST:  50mL ISOVUE-370 IOPAMIDOL (ISOVUE-370) INJECTION 76% COMPARISON:  Prior CT from 03/10/2016 as well as MRI from earlier the same day. FINDINGS: CTA NECK FINDINGS Aortic arch: Visualized aortic arch of normal caliber with normal branch pattern. Moderate calcified plaque within the arch itself and about the origin of the great vessels without hemodynamically significant stenosis. Extensive atheromatous plaque throughout the visualized subclavian arteries without high-grade stenosis. Right carotid system: Multifocal plaque throughout the right common carotid artery without high-grade stenosis. A centric calcified plaque about the right carotid bifurcation and proximal right ICA. There is a focal short-segment greater than 80% stenosis at the proximal right ICA (series 7, image 179). Right ICA tortuous but otherwise patent to the skull base without additional stenosis, dissection, or occlusion. Left carotid system: Multifocal plaque throughout the left common carotid artery without flow-limiting stenosis. Calcified atheromatous plaque about the proximal left ICA with associated relatively mild stenosis of 25-30%. Left ICA patent distally to the skull base without stenosis, dissection, or occlusion. Vertebral arteries: Both of the vertebral arteries arise from the subclavian arteries. Right vertebral artery occludes at its origin. Minimal scant distal reconstitution at the level of C2, likely via the muscular cutaneous branches (series 7, image 181). There is minimal thready flow distally into the skull base. Focal plaque at the origin of the left vertebral artery with severe ostial stenosis. Mild narrowing at the level of C4-5 and  C5-6 due to extrinsic compression from uncovertebral disease. Left vertebral artery otherwise widely patent. Skeleton: No acute osseous abnormality. No worrisome lytic or blastic osseous lesions. Moderate cervical spondylolysis at C4-5 through C6-7. Median sternotomy wires noted. Other neck: No acute soft tissue abnormality within the neck. Salivary glands normal. Thyroid normal. No adenopathy. Upper chest: Visualized upper chest demonstrates no acute abnormality. Centrilobular emphysema. Partially visualized lungs are clear. Review of the MIP images confirms the above findings CTA HEAD FINDINGS Anterior circulation: Petrous segments patent bilaterally. Calcified atheromatous plaque throughout the cavernous/supraclinoid segments bilaterally. Associated moderate to severe multifocal narrowing, right worse than left. ICA termini patent. A1 segments patent bilaterally. Right A1 segment slightly hypoplastic. Patent anterior communicating artery. Anterior cerebral arteries patent to their distal aspects without flow-limiting stenosis. M1 segments mildly irregular but patent without high-grade stenosis. No proximal M2 occlusion. Distal MCA branches well opacified and fairly symmetric. Extensive distal small vessel atheromatous irregularity. Posterior circulation: Dominant left vertebral artery widely patent to the vertebrobasilar junction without flow-limiting stenosis. Patent left PICA. Minimal scant flow within the right V4 segment, nearly occluded. Filling of the right PICA, likely via retrograde flow across the vertebrobasilar junction. Basilar artery patent proximally. Mild to moderate diffuse narrowing of the mid-distal basilar artery. Superior cerebral arteries patent bilaterally. Left PCA supplied via the basilar and is patent to its distal aspect. Hypoplastic right P1 with prominent right posterior communicating artery. Mild to moderate right P2 stenosis (series 8, image 97). Right PCA patent distally to its  distal aspects. Venous sinuses: Patent. Anatomic variants: None significant. No aneurysm or vascular abnormality. Delayed phase: No abnormal enhancement. Review of the MIP images confirms the above findings IMPRESSION: 1. Negative CTA for large vessel occlusion. 2. Severe greater than 80% short-segment  proximal right ICA stenosis. 3. Extensive atherosclerotic change throughout the left carotid artery system without hemodynamically significant stenosis. Occlusion of the right vertebral artery at its origin, minimal scant thready distal reconstitution as above. 4. Severe stenosis at the origin of the left vertebral artery. Dominant left vertebral artery otherwise widely patent. 5. Extensive intracranial atherosclerosis as above, most notable within the carotid siphons and basilar artery. 6. Emphysema. Electronically Signed   By: Rise Mu M.D.   On: 03/11/2017 06:25   Mr Brain Wo Contrast  Result Date: 03/11/2017 CLINICAL DATA:  Initial evaluation for acute speech difficulty, right hand abnormality. EXAM: MRI HEAD WITHOUT CONTRAST TECHNIQUE: Multiplanar, multiecho pulse sequences of the brain and surrounding structures were obtained without intravenous contrast. COMPARISON:  Prior CT from 03/10/2017. FINDINGS: Brain: Diffuse prominence of the CSF containing spaces compatible with generalized cerebral atrophy. Minimal chronic microvascular ischemic changes for age. Encephalomalacia with gliosis within the left temporal occipital region consistent with remote left PCA territory infarct. Associated lacunar infarct within the left thalamus. Additional moderate-sized remote left posterior MCA territory infarct. Associated volume loss with ex vacuo dilatation of the left lateral ventricle. There are patchy multifocal small volume acute ischemic predominantly cortical infarcts involving the left frontoparietal region (series 3, images 29-39). No significant mass effect. No associated hemorrhage. No other  evidence for acute or subacute ischemia. No mass lesion, midline shift or mass effect. No hydrocephalus. No extra-axial fluid collection. Major dural sinuses are grossly patent. Pituitary gland suprasellar region normal. Midline structures intact and normal. Previously question nodular density near the foramen of Monro felt to be related to patient positioning. Vascular: Major intravascular flow voids are maintained at the skull base. Skull and upper cervical spine: Craniocervical junction normal. Upper cervical spine within normal limits. Bone marrow signal intensity within normal limits. No scalp soft tissue abnormality. Sinuses/Orbits: Globes and orbital soft tissues within normal limits. Paranasal sinuses are largely clear. No significant mastoid effusion. Inner ear structures grossly normal. Other: None. IMPRESSION: 1. Patchy small volume acute ischemic nonhemorrhagic left MCA territory cortical infarcts involving the left frontal and parietal lobes as above. 2. Moderate to large-sized remote left posterior MCA and PCA territory infarcts. 3. Age-related cerebral atrophy. Electronically Signed   By: Rise Mu M.D.   On: 03/11/2017 01:35   Dg Hand Complete Right  Result Date: 03/10/2017 CLINICAL DATA:  Hand pain, history of stroke EXAM: RIGHT HAND - COMPLETE 3+ VIEW COMPARISON:  None FINDINGS: Diffuse osseous demineralization. Transverse fracture at base of proximal phalanx RIGHT little finger with displacement, angulation, and deformity. Joint spaces preserved. No additional fracture, dislocation, or bone destruction. Dorsal soft tissue swelling overlying the MCP joints. IMPRESSION: Displaced and angulated fracture at base of proximal phalanx RIGHT little finger. Electronically Signed   By: Ulyses Southward M.D.   On: 03/10/2017 20:31   Dg Finger Little Right  Result Date: 03/11/2017 CLINICAL DATA:  Status post reduction of right fifth proximal phalanx fracture. Initial encounter. EXAM: RIGHT LITTLE  FINGER 2+V COMPARISON:  None. FINDINGS: There is mildly improved alignment of the fracture through the base of the fifth proximal phalanx, with residual radial and volar displacement. Surrounding soft tissue swelling is noted. No new fractures are seen. IMPRESSION: Mildly improved alignment of the fracture through the base of the fifth proximal phalanx, with residual radial and volar displacement. Electronically Signed   By: Roanna Raider M.D.   On: 03/11/2017 04:26   Vas US Carotid  Result Date: 03/12/2017 Carotid Arterial Duplex Study Indications:  CVA, Speech disturbance, Weakness and Facial droop. Risk Factors:  Hypertension, hyperlipidemia, current smoker, coronary artery                disease. Other Factors: History of CABG 07/2012. Examination Guidelines: A complete evaluation includes B-mode imaging, spectral doppler, color doppler, and power doppler as needed of all accessible portions of each vessel. Bilateral testing is considered an integral part of a complete examination. Limited examinations for reoccurring indications may be performed as noted. Prior exam from 08/14/12 is available for comparison.  Right Carotid Findings: +----------+--------+--------+--------+--------+---------+           PSV cm/sEDV cm/sStenosisDescribeComments  +----------+--------+--------+--------+--------+---------+ CCA Prox  -76     -19             calcific          +----------+--------+--------+--------+--------+---------+ CCA Distal-114    -16                               +----------+--------+--------+--------+--------+---------+ ICA Prox  -107    -28             calcificShadowing +----------+--------+--------+--------+--------+---------+ ICA Distal-91     -18                               +----------+--------+--------+--------+--------+---------+ ECA       -102    -20                               +----------+--------+--------+--------+--------+---------+  +---------+--------+--------+------------+ VertebralPSV cm/sEDV cm/sNot assessed +---------+--------+--------+------------+  Left Carotid Findings: +----------+--------+--------+--------+--------+---------+           PSV cm/sEDV cm/sStenosisDescribeComments  +----------+--------+--------+--------+--------+---------+ CCA Prox  102     7               calcific          +----------+--------+--------+--------+--------+---------+ CCA Distal-84     -22                               +----------+--------+--------+--------+--------+---------+ ICA Prox  -74     -27             calcificShadowing +----------+--------+--------+--------+--------+---------+ ICA Distal-47     -19                               +----------+--------+--------+--------+--------+---------+ ECA       -96     -12                               +----------+--------+--------+--------+--------+---------+ +---------+--------+---+--------+---+ VertebralPSV cm/s-73EDV cm/s-16 +---------+--------+---+--------+---+  Final Interpretation: Right Carotid: Velocities in the right ICA are consistent with a 1-39% stenosis. Left Carotid: Velocities in the left ICA are consistent with a 1-39% stenosis. Vertebrals: Left vertebral artery was patent with antegrade flow. RIght             vertebral artery flow not insonated. *See table(s) above for measurements and observations.  Electronically signed by Delia Heady on 03/12/2017 at 12:22:29 PM.      Scheduled Meds: . apixaban  5 mg Oral BID  . atorvastatin  10 mg Oral q1800  . cholecalciferol  1,000 Units Oral Daily  . donepezil  5 mg Oral QHS  . potassium chloride SA  20 mEq Oral Daily   Continuous Infusions:   LOS: 0 days    Time spent: Total of 25 minutes spent with pt, greater than 50% of which was spent in discussion of  treatment, counseling and coordination of care   Latrelle Dodrill, MD Pager: Text Page via [email or web address]   If 7PM-7AM, please contact  night-coverage [email or web address] 03/12/2017, 3:46 PM

## 2017-03-12 NOTE — Evaluation (Signed)
Occupational Therapy Evaluation Patient Details Name: [name] [name] MRN: [id number] DOB: [birth year] Today's Date: 03/12/2017    History of Present Illness Pt is a 67 y/o female admitted secondary to R weakness and fall. 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 Imaging revealed displaced fracture of the proximal phalanx on R little finger. Pt is s/p closed reduction and splinting. PMH includes CVA, CAD s/p CABG, HTN, a fib, and tobacco abuse.    Clinical Impression   Pt admitted with the above diagnoses and presents with below problem list. Pt will benefit from continued acute OT to address the below listed deficits and maximize independence with basic ADLs prior to d/c to next venue. PTA pt was independent with ADLs. Pt is currently setup to min A with ADLs and functional mobility. Presents with deficits in  balance, cognition (unclear what baseline is), and R side coordination. Feel pt would be good candidate for CIR prior to returning home. Pt very motivated to work with therapies.     Follow Up Recommendations  CIR    Equipment Recommendations  Other (comment)(defer to next venue)    Recommendations for Other Services       Precautions / Restrictions Precautions Precautions: Fall Restrictions Weight Bearing Restrictions: Yes RUE Weight Bearing: Non weight bearing Other Position/Activity Restrictions: Assume NWB on R hand given new fracture. No orders for WB status      Mobility Bed Mobility Overal bed mobility: Needs Assistance Bed Mobility: Supine to Sit     Supine to sit: Min guard;HOB elevated     General bed mobility comments: HOB elevated. Extra time and effort. Coordination deficits noted  Transfers Overall transfer level: Needs assistance Equipment used: Rolling walker (2 wheeled);1 person hand held assist Transfers: Sit to/from Stand Sit to Stand: Min assist         General  transfer comment: Min A for lift assist and steadying assist. Verbal cues not to use R hand to press up into standing.     Balance Overall balance assessment: Needs assistance Sitting-balance support: Feet supported;Single extremity supported Sitting balance-Leahy Scale: Fair     Standing balance support: During functional activity;Single extremity supported Standing balance-Leahy Scale: Poor Standing balance comment: Reliant on LUE support and external support for steadying.                            ADL either performed or assessed with clinical judgement   ADL Overall ADL's : Needs assistance/impaired Eating/Feeding: Set up;Sitting;Cueing for compensatory techinques   Grooming: Oral care;Sitting;Wash/dry face   Upper Body Bathing: Minimal assistance;Sitting   Lower Body Bathing: Minimal assistance;Sit to/from stand   Upper Body Dressing : Minimal assistance;Sitting   Lower Body Dressing: Minimal assistance;Sit to/from stand   Toilet Transfer: Min guard;Minimal assistance;Ambulation;BSC;RW   Toileting- Clothing Manipulation and Hygiene: Minimal assistance;Sit to/from stand   Tub/ Shower Transfer: Minimal assistance;Ambulation;3 in 1;Rolling walker   Functional mobility during ADLs: Min guard;Minimal assistance;Rolling walker General ADL Comments: Pt completed toilet transfer, pericare, UB/LB bathing, and grooming tasks as detailed above. RUE coordination deficits     Vision Patient Visual Report: No change from baseline Additional Comments: Pt reports some visual deficits from previous stroke.      Perception     Praxis      Pertinent Vitals/Pain Pain Assessment: No/denies pain     Hand Dominance Right   Extremity/Trunk  Assessment Upper Extremity Assessment Upper Extremity Assessment: Generalized weakness;RUE deficits/detail RUE Deficits / Details: 4th and 5th digits and medial palm in bandage. grossly 4-/5. Imparied coordination, tends to use  whole arm movements vs. isolated.  RUE Coordination: decreased fine motor;decreased gross motor   Lower Extremity Assessment Lower Extremity Assessment: Defer to PT evaluation   Cervical / Trunk Assessment Cervical / Trunk Assessment: Kyphotic   Communication Communication Communication: Expressive difficulties   Cognition Arousal/Alertness: Awake/alert Behavior During Therapy: WFL for tasks assessed/performed Overall Cognitive Status: No family/caregiver present to determine baseline cognitive functioning                                 General Comments: Pt with previous stroke so unsure if cognitive deficits are new. Pt reporting that she had not walked at all (PT note says she walked 15') and did not feel like she could walk to bathroom, ultimately was OOB and walking (with A) distance to/from bathroom).    General Comments       Exercises     Shoulder Instructions      Home Living Family/patient expects to be discharged to:: Unsure Living Arrangements: Spouse/significant other Available Help at Discharge: Family;Available 24 hours/day Type of Home: House Home Access: Level entry     Home Layout: One level                   Additional Comments: Unsure of accuracy of info given pt's expressive aphasia and cognitive deficits. Some information given different than previous admission.   Lives With: Daughter(recieved a phone call from boyfriend during eval)    Prior Functioning/Environment Level of Independence: Independent        Comments: Pt reports she was independent        OT Problem List: Impaired balance (sitting and/or standing);Decreased strength;Decreased coordination;Decreased cognition;Decreased safety awareness;Decreased knowledge of use of DME or AE;Decreased knowledge of precautions;Impaired UE functional use      OT Treatment/Interventions: Self-care/ADL training;Therapeutic exercise;Neuromuscular education;DME and/or AE  instruction;Therapeutic activities;Patient/family education;Balance training;Cognitive remediation/compensation    OT Goals(Current goals can be found in the care plan section) Acute Rehab OT Goals Patient Stated Goal: CIR then home OT Goal Formulation: With patient Time For Goal Achievement: 03/26/17 Potential to Achieve Goals: Good ADL Goals Pt Will Perform Grooming: with modified independence;sitting Pt Will Perform Upper Body Dressing: with modified independence;sitting Pt Will Perform Lower Body Dressing: with modified independence;sit to/from stand Pt Will Transfer to Toilet: with modified independence;ambulating Pt Will Perform Toileting - Clothing Manipulation and hygiene: with modified independence;sit to/from stand Pt Will Perform Tub/Shower Transfer: with modified independence;ambulating;3 in 1  OT Frequency: Min 2X/week   Barriers to D/C:            Co-evaluation              AM-PAC PT "6 Clicks" Daily Activity     Outcome Measure Help from another person eating meals?: A Little Help from another person taking care of personal grooming?: A Little Help from another person toileting, which includes using toliet, bedpan, or urinal?: A Little Help from another person bathing (including washing, rinsing, drying)?: A Little Help from another person to put on and taking off regular upper body clothing?: A Little Help from another person to put on and taking off regular lower body clothing?: A Little 6 Click Score: 18   End of Session Equipment Utilized During Treatment: Engineer, water  Communication: Mobility status  Activity Tolerance: Patient tolerated treatment well Patient left: in chair;with call bell/phone within reach;with chair alarm set  OT Visit Diagnosis: Unsteadiness on feet (R26.81);Ataxia, unspecified (R27.0);Other abnormalities of gait and mobility (R26.89) Hemiplegia - Right/Left: Right                Time: 1440-1520 OT Time Calculation (min):  40 min Charges:  OT General Charges $OT Visit: 1 Visit OT Evaluation $OT Eval Low Complexity: 1 Low OT Treatments $Self Care/Home Management : 8-22 mins G-Codes:       Pilar Grammes 03/12/2017, 4:23 PM

## 2017-03-12 NOTE — Progress Notes (Signed)
NEUROHOSPITALISTS STROKE TEAM - DAILY PROGRESS NOTE   ADMISSION HISTORY: [name] [name] is a 67 y.o. female with a past medical history of atrial fibrillation on Eliquis, hypertension, hyperlipidemia, CABG, stroke in the past affecting the left MCA territory with residual mild aphasia and right-sided weakness and sensory loss, tobacco abuse who presents to the emergency room for evaluation of right facial drooping, difficulty talking and pain in her right hand.  Patient was unable to provide complete history.  Her brother was at bedside at the time of this encounter.  He said that the family or friends had noticed that she was not talking normally.  Since her last stroke, she has had speech deficits but was able to communicate with nearly complete sentences.  But since night of 03/09/2017, she has not been able to talk normally.  She was also noted to have a deformed and swollen right hand and the presumption is that she had a fall and probably hurt her hand but she denies any history of falls.    Patient was evaluated in the emergency room by the ED providers.  An MRI was obtained given the history of stroke.  The MRI showed the remote moderate to large sized left posterior MCA and PCA territory infarct but also showed patchy acute nonhemorrhagic strokes in the left MCA territory, very cortical involving the frontal and parietal lobes.  A neurological consultation was requested for evaluation of the strokes and further workup recommendations. According to the patient's family member at the bedside, no clear-cut explanation was found for her strokes in 2014 when she had a large stroke.    Patient had an implantable loop recorder placed in 2014 and atrial fibrillation was detected at some point in 2015 from the implantable loop recorder she was started on Eliquis. According to the patient, she says that she is on aspirin but could not confirm that she is  taking aspirin or Eliquis.    Family member at the bedside could not confirm the medication as well.    Further information about anticoagulation will need to be obtained once other family members are at bedside in the morning.  LKW: Greater than 24 hours prior to arrival tpa given?: no, outside the window Premorbid modified Rankin scale (mRS):4 NIHSS 1a Level of Conscious.: 0 1b LOC Questions: 2 1c LOC Commands: 0 2 Best Gaze: 0 3 Visual: 2 4 Facial Palsy: 1 5a Motor Arm - left: 0 5b Motor Arm - Right: 0 6a Motor Leg - Left: 0 6b Motor Leg - Right: 1 7 Limb Ataxia: 0 8 Sensory: 2 9 Best Language: 1 10 Dysarthria: 2 11 Extinct. and Inatten.: 0 TOTAL: 11  SUBJECTIVE (INTERVAL HISTORY) No family is at the bedside. Patient is found laying in bed in NAD. Overall she feels her condition is unchanged. Voices no new complaints. No new events reported overnight. Spoke to sister [name] [name] by phone to verify medication compliance. It appears patient was not taking her Eliquis and unclear still if taking any ASA. Patient has significant expressive and mild receptive aphasia on exam. She is awaiting rehab bed approval   OBJECTIVE Lab Results: CBC:  Recent Labs  Lab 03/10/17 1932 03/11/17 0447  WBC 6.1 6.0  HGB 13.5 11.9*  HCT 40.1 35.9*  MCV 93.5 92.8  PLT 249 230   BMP: Recent Labs  Lab 03/10/17 1932 03/11/17 0447  NA 142 141  K 3.4* 3.3*  CL 106 106  CO2 23 23  GLUCOSE 85  82  BUN 10 9  CREATININE 0.85 0.79  CALCIUM 9.5 9.2  MG  --  1.6*   Liver Function Tests:  Recent Labs  Lab 03/10/17 1932  AST 32  ALT 24  ALKPHOS 71  BILITOT 0.5  PROT 7.1  ALBUMIN 4.0   Coagulation Studies:  Recent Labs    03/10/17 1932  INR 0.95   Urine Drug Screen:     Component Value Date/Time   LABOPIA NONE DETECTED 08/16/2012 0205   COCAINSCRNUR NONE DETECTED 08/16/2012 0205   LABBENZ POSITIVE (A) 08/16/2012 0205   AMPHETMU NONE DETECTED 08/16/2012 0205   THCU NONE DETECTED  08/16/2012 0205   LABBARB NONE DETECTED 08/16/2012 0205    Alcohol Level:  Recent Labs  Lab 03/10/17 1932  ETH <10   PHYSICAL EXAM Temp:  [97.7 F (36.5 C)-98.9 F (37.2 C)] 98.9 F (37.2 C) (02/09 1048) Pulse Rate:  [54-78] 71 (02/09 1048) Resp:  [15-18] 18 (02/09 1048) BP: (107-144)/(51-84) 107/51 (02/09 1048) SpO2:  [98 %-100 %] 100 % (02/09 1048) General - Well nourished, well developed, in no apparent distress HEENT-  Normocephalic,  Cardiovascular - Regular rate and rhythm  Respiratory - Lungs clear bilaterally. No wheezing. Abdomen - soft and non-tender, BS normal Extremities: No edema in both lower extremities.  Right hand examination showed swelling and tenderness to palpation on the fourth and fifth metacarpals on the right hand. Neurological Exam : Patient is awake, alert, oriented to self. She has moderate expressive aphasia. she can follow simple midline and one-step commands. She was able to name a watch, but unable to name ring or thumb or knuckles. She was able to repeat a sentence with severe dysarthria. Her speech is severely dysarthric. Cranial nerves: Pupils equal round reactive to light, appears to not blink to threat from the right, blinks to threat from the left, mild right nasolabial fold flattening, shoulder shrug intact, tongue midline. Motor exam: 4/5 right upper and lower extremity.  No vertical drift in the right upper extremity but there is vertical drift in the right lower extremity.  No drift on the left side with 5/5 strength. Sensory exam: Decreased sensation to light touch on the right. Coordination: Intact finger-nose-finger on the left.  Slow finger-nose-finger on the right. Gait examination was deferred at this time.  IMAGING: I have personally reviewed the radiological images below and agree with the radiology interpretations.  Ct Head Wo Contrast Result Date: 03/10/2017 IMPRESSION: 1. No definite CT evidence for acute intracranial  abnormality. Old left parietal, posterior temporal and occipital infarcts. 2. Suspected 6 mm isodense nodule near the foramen of Monro, new since 2014, possible colloid nodule, however given isodense appearance and new finding suggest further evaluation with nonemergent MRI. Electronically Signed   By: Jasmine Pang M.D.   On: 03/10/2017 20:08   Ct Angio Head/ Neck W Or Wo Contrast Result Date: 03/11/2017 IMPRESSION: 1. Negative CTA for large vessel occlusion. 2. Severe greater than 80% short-segment proximal right ICA stenosis. 3. Extensive atherosclerotic change throughout the left carotid artery system without hemodynamically significant stenosis. Occlusion of the right vertebral artery at its origin, minimal scant thready distal reconstitution as above. 4. Severe stenosis at the origin of the left vertebral artery. Dominant left vertebral artery otherwise widely patent. 5. Extensive intracranial atherosclerosis as above, most notable within the carotid siphons and basilar artery. 6. Emphysema. Electronically Signed   By: Rise Mu M.D.   On: 03/11/2017 06:25   Mr Brain Wo Contrast Result Date:  03/11/2017 . IMPRESSION: 1. Patchy small volume acute ischemic nonhemorrhagic left MCA territory cortical infarcts involving the left frontal and parietal lobes as above. 2. Moderate to large-sized remote left posterior MCA and PCA territory infarcts. 3. Age-related cerebral atrophy. Electronically Signed   By: Rise Mu M.D.   On: 03/11/2017 01:35   Echocardiogram:                                              PENDING B/L Carotid U/S:                                                PENDING     IMPRESSION: [name] is a 67 y.o. female with PMH of coronary artery disease, hypertension, hyperlipidemia, prior left MCA and left PCA territory stroke with residual right hemiparesis and mild aphasia, atrial fibrillation not taking her Eliquis, presented to the emergency room for  evaluation of worsening aphasia and right-sided weakness.  She was not a candidate for TPA or endovascular thrombectomy based on timing of last known normal.  MRI reveals:  Patchy small volume acute ischemic nonhemorrhagic left MCA territory cortical infarcts involving the left frontal and parietal lobes  Suspected Etiology: cardioembolic from AFIB not on [facility] therapy Resultant Symptoms: worsening aphasia and right-sided weakness. Stroke Risk Factors: atrial fibrillation, carotid stenosis, hypertension and smoking Other Stroke Risk Factors: Advanced age, Cigarette smoker, CAD, Hx stroke, Family Hx CVA  Outstanding Stroke Work-up Studies:     Echocardiogram:                                                    PENDING B/L Carotid U/S:                                                     Bilateral 1-39% carotid stenosis. Vertebral artery flow is antegrade bilaterally.  03/12/2017 ASSESSMENT:   Neuro exam unchanged with Right sided weakness and Aphasia. According to sister [name] [name] patient was not compliant with her Eliquis. Patient will need intensive Rehab hopefully a CIR candidate. Eliquis has been     PLAN  03/12/2017: Continue Eliquis/ Statin  PT/OT/SLP Consult PM & Rehab Consult Case Management /MSW Ongoing aggressive stroke risk factor management Patient's family will be counseled to be compliant with her antithrombotic medications Patient's family will be counseled on Lifestyle modifications including, Diet, Exercise, and Stress Follow up with [facility] in 6 weeks  HX OF STROKES: Moderate to large-sized left posterior MCA and PCA territory infarcts. Baseline findings: residual right hemiparesis and mild aphasia  Extensive IC stenosis and Right ICA Stenosis: On Eliquis for now  DYSPHAGIA: NPO until passes SLP swallow evaluation Aspiration Precautions in progress  AFIB, CHRONIC: Restart Eliquis BID per pharmacy today if able to swallow Cardiology follow up    HYPERTENSION: Stable, Avoid Hypotension and Dehydration Permissive hypertension (OK if <220/120) for 24-48 hours post stroke and then  gradually normalized within 5-7 days. Long term BP goal normotensive. May slowly restart home B/P medications after 48 hours Home Meds: Lisinopril, Norvasc  HYPERLIPIDEMIA:    Component Value Date/Time   CHOL 99 03/11/2017 0447   TRIG 48 03/11/2017 0447   HDL 36 (L) 03/11/2017 0447   CHOLHDL 2.8 03/11/2017 0447   VLDL 10 03/11/2017 0447   LDLCALC 53 03/11/2017 0447  Home Meds:  NONE LDL  goal < 70 Started on  Lipitor to 10 mg daily Continue statin at discharge  R/O DIABETES: Lab Results  Component Value Date   HGBA1C 5.6 03/11/2017  HgbA1c goal < 7.0  TOBACCO ABUSE & POLYSUBSTANCE ABUSE UDS+ Current smoker Smoking cessation counseling provided Nicotine patch provided  Other Active Problems: Principal Problem:   Acute ischemic left MCA stroke (HCC) Active Problems:   Essential hypertension   Atrial fibrillation (HCC)   CAD (coronary artery disease)   Displaced fracture of proximal phalanx of left little finger, initial encounter for closed fracture    Hospital day # 0 VTE prophylaxis: Lovenox  Diet : Fall precautions Diet regular Room service appropriate? Yes; Fluid consistency: Thin   FAMILY UPDATES: No family at bedside Spoke by phone, to sister Trish Mage, to verify if patient was taking her Eliquis or ASA, it did not appear that the patient had been compliant with her medications  TEAM UPDATES: Lenox Ponds, MD   Prior Home Stroke Medications:  Patient was non compliant with ASA and Eliquis  Discharge Stroke Meds:  Please discharge patient on Eliquis (apixaban) daily   Disposition: 06-Home-Health Care Svc Therapy Recs:               PENDING Follow Up:  Follow-up Information    Micki Riley, MD. Schedule an appointment as soon as possible for a visit in 6 week(s).   Specialties:  Neurology,  Radiology Contact information: [address] [place] [address] [phone]          Patient, No Pcp Per -PCP Follow up in 1-2 weeks   Case Management aware of need    I have personally examined this patient, reviewed notes, independently viewed imaging studies, participated in medical decision making and plan of care.ROS completed by me personally and pertinent positives fully documented  I have made any additions or clarifications directly to the above note. Agree with note above. She has presented with expressive aphasia secondary to embolic small left MCA branch infarcts likely due to atrial fibrillation and noncompliance with her eliquis continue eliquis and transferred to inpatient rehabilitation when bed available. Stroke team will sign off. Kindly call for questions.. Discussed with Dr. Edward Jolly.   Delia Heady, MD Medical Director [facility] Pager: [phone] 03/12/2017 12:39 PM  To contact Stroke Continuity provider, please refer to [email or web address]. After hours, contact General Neurology

## 2017-03-12 NOTE — Progress Notes (Signed)
Echocardiogram 2D Echocardiogram has been performed.  [name] [name] 03/12/2017, 9:32 AM

## 2017-03-12 NOTE — Plan of Care (Signed)
  Education: Knowledge of disease or condition will improve 03/12/2017 2349 - Progressing by Earnest Rosier, RN   Coping: Will verbalize positive feelings about self 03/12/2017 2349 - Progressing by Earnest Rosier, RN   Nutrition: Risk of aspiration will decrease 03/12/2017 2349 - Progressing by Earnest Rosier, RN   Nutrition: Dietary intake will improve 03/12/2017 2349 - Progressing by Earnest Rosier, RN

## 2017-03-12 NOTE — Progress Notes (Signed)
VASCULAR LAB PRELIMINARY  PRELIMINARY  PRELIMINARY  PRELIMINARY  Carotid duplex completed.    Preliminary report:  1-39% ICA stenosis. Vertebral artery flow is antegrade.   Elynor Kallenberger, RVT 03/12/2017, 9:14 AM

## 2017-03-13 LAB — BASIC METABOLIC PANEL
Anion gap: 8 (ref 5–15)
BUN: 8 mg/dL (ref 6–20)
CHLORIDE: 107 mmol/L (ref 101–111)
CO2: 22 mmol/L (ref 22–32)
CREATININE: 0.72 mg/dL (ref 0.44–1.00)
Calcium: 8.9 mg/dL (ref 8.9–10.3)
Glucose, Bld: 89 mg/dL (ref 65–99)
POTASSIUM: 5.3 mmol/L — AB (ref 3.5–5.1)
SODIUM: 137 mmol/L (ref 135–145)

## 2017-03-13 LAB — MAGNESIUM: MAGNESIUM: 1.9 mg/dL (ref 1.7–2.4)

## 2017-03-13 MED ORDER — LISINOPRIL 20 MG PO TABS
40.0000 mg | ORAL_TABLET | Freq: Every day | ORAL | Status: DC
  Administered 2017-03-13 – 2017-03-14 (×2): 40 mg via ORAL
  Filled 2017-03-13 (×2): qty 2

## 2017-03-13 MED ORDER — AMLODIPINE BESYLATE 10 MG PO TABS
10.0000 mg | ORAL_TABLET | Freq: Every day | ORAL | Status: DC
  Administered 2017-03-13 – 2017-03-14 (×2): 10 mg via ORAL
  Filled 2017-03-13 (×2): qty 1

## 2017-03-13 NOTE — Progress Notes (Signed)
PROGRESS NOTE [facility]   [name] [name]   BJY:[id number] DOB: 11/27/1950  DOA: 03/11/2017 PCP: Patient, No Pcp Per   Brief Narrative:  [name] [name] is a 67 year old female with past medical history significant for prior CVA, CAD status post CABG, hypertension, PAF on Eliquis who presented to the emergency department complaining of slurred speech and facial droop.  Upon ED evaluation MRI of the brain revealed patchy  acute ischemic nonhemorrhagic left MCA infarct.  Patient was admitted for neurological evaluation.  Patient also sustained a fall hurting her right hand x-rays was taken and showed displaced fracture of the 5th proximal phalanx, this was splinted in the ED.  Physical therapy evaluated patient and recommended inpatient rehabilitation.  Subjective: No new complaints, awaiting CIR placement   Assessment & Plan: Acute ischemic left MCA infarct Felt to be secondary to A. fib with noncompliant with anticoagulation. Resultant expressive aphasia with right-sided weakness Echocardiogram and carotid Doppler unrevealing Lipid panel and A1c unremarkable Resume Eliquis, continue Lipitor PT recommending inpatient rehab  PAF Currently in sinus rhythm Has not been compliant with Eliquis Continue Eliquis, encourage compliance  CAD Asymptomatic Continue Lipitor  Displaced fracture left fifth proximal phalanx Status post reduction and splinting in the ED Supportive treatment  Hypertension Initially allowed permissive HTN BP normalized, will resume, Amlodipine and Lisinopril for now  Hold clonidine, may not needed  Monitor BP closely   Hypokalemia/hypomagnesemia K in the upper borderline   DVT prophylaxis: Eliquis  Code Status: Full code  Family Communication: None at bedside  Disposition Plan: CIR when bed available   Consultants:   Neurology  Procedures:   Echo  Antimicrobials:  None   Objective: Vitals:   03/13/17 0300 03/13/17 0527 03/13/17  1006 03/13/17 1344  BP: (!) 144/81 139/65 121/64 133/65  Pulse: 82 71 85 83  Resp: 17 18 18 16   Temp: 98.4 F (36.9 C) 99 F (37.2 C) 98.3 F (36.8 C) 98.2 F (36.8 C)  TempSrc: Oral Oral Oral Oral  SpO2: 99% 100% 100% 100%  Weight:      Height:        Intake/Output Summary (Last 24 hours) at 03/13/2017 1556 Last data filed at 03/13/2017 1300 Gross per 24 hour  Intake 770 ml  Output -  Net 770 ml   Filed Weights   03/10/17 1928  Weight: 43.1 kg (95 lb)    Examination:  General: Pt is alert, awake, not in acute distress Cardiovascular: RRR, S1/S2 +, no rubs, no gallops Respiratory: CTA bilaterally Abdominal: Soft, NT, ND Extremities: no edema  Neuro: Expressive aphasia, strength 4/5 on the R, Fifth phalanx splinted    Data Reviewed: I have personally reviewed following labs and imaging studies  CBC: Recent Labs  Lab 03/10/17 1932 03/11/17 0447  WBC 6.1 6.0  NEUTROABS 2.8  --   HGB 13.5 11.9*  HCT 40.1 35.9*  MCV 93.5 92.8  PLT 249 230   Basic Metabolic Panel: Recent Labs  Lab 03/10/17 1932 03/11/17 0447 03/13/17 0428  NA 142 141 137  K 3.4* 3.3* 5.3*  CL 106 106 107  CO2 23 23 22   GLUCOSE 85 82 89  BUN 10 9 8   CREATININE 0.85 0.79 0.72  CALCIUM 9.5 9.2 8.9  MG  --  1.6* 1.9   GFR: Estimated Creatinine Clearance: 45.8 mL/min (by C-G formula based on SCr of 0.72 mg/dL). Liver Function Tests: Recent Labs  Lab 03/10/17 1932  AST 32  ALT 24  ALKPHOS 71  BILITOT 0.5  PROT 7.1  ALBUMIN 4.0   No results for input(s): LIPASE, AMYLASE in the last 168 hours. No results for input(s): AMMONIA in the last 168 hours. Coagulation Profile: Recent Labs  Lab 03/10/17 1932  INR 0.95   Cardiac Enzymes: No results for input(s): CKTOTAL, CKMB, CKMBINDEX, TROPONINI in the last 168 hours. BNP (last 3 results) No results for input(s): PROBNP in the last 8760 hours. HbA1C: Recent Labs    03/11/17 0447  HGBA1C 5.6   CBG: No results for input(s):  GLUCAP in the last 168 hours. Lipid Profile: Recent Labs    03/11/17 0447  CHOL 99  HDL 36*  LDLCALC 53  TRIG 48  CHOLHDL 2.8   Thyroid Function Tests: No results for input(s): TSH, T4TOTAL, FREET4, T3FREE, THYROIDAB in the last 72 hours. Anemia Panel: No results for input(s): VITAMINB12, FOLATE, FERRITIN, TIBC, IRON, RETICCTPCT in the last 72 hours. Sepsis Labs: No results for input(s): PROCALCITON, LATICACIDVEN in the last 168 hours.  No results found for this or any previous visit (from the past 240 hour(s)).    Radiology Studies: Ct Angio Head W Or Wo Contrast  Result Date: 03/11/2017 CLINICAL DATA:  Initial evaluation for acute speech difficulty, right hand swelling. EXAM: CT ANGIOGRAPHY HEAD AND NECK TECHNIQUE: Multidetector CT imaging of the head and neck was performed using the standard protocol during bolus administration of intravenous contrast. Multiplanar CT image reconstructions and MIPs were obtained to evaluate the vascular anatomy. Carotid stenosis measurements (when applicable) are obtained utilizing NASCET criteria, using the distal internal carotid diameter as the denominator. CONTRAST:  67mL ISOVUE-370 IOPAMIDOL (ISOVUE-370) INJECTION 76% COMPARISON:  Prior CT from 03/10/2016 as well as MRI from earlier the same day. FINDINGS: CTA NECK FINDINGS Aortic arch: Visualized aortic arch of normal caliber with normal branch pattern. Moderate calcified plaque within the arch itself and about the origin of the great vessels without hemodynamically significant stenosis. Extensive atheromatous plaque throughout the visualized subclavian arteries without high-grade stenosis. Right carotid system: Multifocal plaque throughout the right common carotid artery without high-grade stenosis. A centric calcified plaque about the right carotid bifurcation and proximal right ICA. There is a focal short-segment greater than 80% stenosis at the proximal right ICA (series 7, image 179). Right ICA  tortuous but otherwise patent to the skull base without additional stenosis, dissection, or occlusion. Left carotid system: Multifocal plaque throughout the left common carotid artery without flow-limiting stenosis. Calcified atheromatous plaque about the proximal left ICA with associated relatively mild stenosis of 25-30%. Left ICA patent distally to the skull base without stenosis, dissection, or occlusion. Vertebral arteries: Both of the vertebral arteries arise from the subclavian arteries. Right vertebral artery occludes at its origin. Minimal scant distal reconstitution at the level of C2, likely via the muscular cutaneous branches (series 7, image 181). There is minimal thready flow distally into the skull base. Focal plaque at the origin of the left vertebral artery with severe ostial stenosis. Mild narrowing at the level of C4-5 and C5-6 due to extrinsic compression from uncovertebral disease. Left vertebral artery otherwise widely patent. Skeleton: No acute osseous abnormality. No worrisome lytic or blastic osseous lesions. Moderate cervical spondylolysis at C4-5 through C6-7. Median sternotomy wires noted. Other neck: No acute soft tissue abnormality within the neck. Salivary glands normal. Thyroid normal. No adenopathy. Upper chest: Visualized upper chest demonstrates no acute abnormality. Centrilobular emphysema. Partially visualized lungs are clear. Review of the MIP images confirms the above findings CTA HEAD FINDINGS Anterior circulation:  Petrous segments patent bilaterally. Calcified atheromatous plaque throughout the cavernous/supraclinoid segments bilaterally. Associated moderate to severe multifocal narrowing, right worse than left. ICA termini patent. A1 segments patent bilaterally. Right A1 segment slightly hypoplastic. Patent anterior communicating artery. Anterior cerebral arteries patent to their distal aspects without flow-limiting stenosis. M1 segments mildly irregular but patent without  high-grade stenosis. No proximal M2 occlusion. Distal MCA branches well opacified and fairly symmetric. Extensive distal small vessel atheromatous irregularity. Posterior circulation: Dominant left vertebral artery widely patent to the vertebrobasilar junction without flow-limiting stenosis. Patent left PICA. Minimal scant flow within the right V4 segment, nearly occluded. Filling of the right PICA, likely via retrograde flow across the vertebrobasilar junction. Basilar artery patent proximally. Mild to moderate diffuse narrowing of the mid-distal basilar artery. Superior cerebral arteries patent bilaterally. Left PCA supplied via the basilar and is patent to its distal aspect. Hypoplastic right P1 with prominent right posterior communicating artery. Mild to moderate right P2 stenosis (series 8, image 97). Right PCA patent distally to its distal aspects. Venous sinuses: Patent. Anatomic variants: None significant. No aneurysm or vascular abnormality. Delayed phase: No abnormal enhancement. Review of the MIP images confirms the above findings IMPRESSION: 1. Negative CTA for large vessel occlusion. 2. Severe greater than 80% short-segment proximal right ICA stenosis. 3. Extensive atherosclerotic change throughout the left carotid artery system without hemodynamically significant stenosis. Occlusion of the right vertebral artery at its origin, minimal scant thready distal reconstitution as above. 4. Severe stenosis at the origin of the left vertebral artery. Dominant left vertebral artery otherwise widely patent. 5. Extensive intracranial atherosclerosis as above, most notable within the carotid siphons and basilar artery. 6. Emphysema. Electronically Signed   By: Rise Mu M.D.   On: 03/11/2017 06:25   Dg Wrist Complete Right  Result Date: 03/10/2017 CLINICAL DATA:  RIGHT hand pain, stroke EXAM: RIGHT WRIST - COMPLETE 3+ VIEW COMPARISON:  None FINDINGS: Diffuse osseous demineralization. Joint spaces  preserved. No acute fracture, dislocation, or bone destruction. IMPRESSION: No acute osseous abnormalities. Electronically Signed   By: Ulyses Southward M.D.   On: 03/10/2017 20:32   Ct Head Wo Contrast  Result Date: 03/10/2017 CLINICAL DATA:  Garbled speech and confusion EXAM: CT HEAD WITHOUT CONTRAST TECHNIQUE: Contiguous axial images were obtained from the base of the skull through the vertex without intravenous contrast. COMPARISON:  MRI brain 08/14/2012, CT brain 08/13/2012 FINDINGS: Brain: No acute territorial infarction, or hemorrhage is visualized. Possible small 6 mm nodule or mass near the foramen of Monro. Encephalomalacia in the left parietal, posterior temporal and left occipital lobes consistent with old infarct. Mild atrophy. Ex vacuo dilatation of left lateral ventricle. Vascular: No hyperdense vessel.  Carotid vascular calcification. Skull: No fracture or suspicious lesion Sinuses/Orbits: No acute finding. Other: None IMPRESSION: 1. No definite CT evidence for acute intracranial abnormality. Old left parietal, posterior temporal and occipital infarcts. 2. Suspected 6 mm isodense nodule near the foramen of Monro, new since 2014, possible colloid nodule, however given isodense appearance and new finding suggest further evaluation with nonemergent MRI. Electronically Signed   By: Jasmine Pang M.D.   On: 03/10/2017 20:08   Ct Angio Neck W Or Wo Contrast  Result Date: 03/11/2017 CLINICAL DATA:  Initial evaluation for acute speech difficulty, right hand swelling. EXAM: CT ANGIOGRAPHY HEAD AND NECK TECHNIQUE: Multidetector CT imaging of the head and neck was performed using the standard protocol during bolus administration of intravenous contrast. Multiplanar CT image reconstructions and MIPs were obtained to evaluate the  vascular anatomy. Carotid stenosis measurements (when applicable) are obtained utilizing NASCET criteria, using the distal internal carotid diameter as the denominator. CONTRAST:  50mL  ISOVUE-370 IOPAMIDOL (ISOVUE-370) INJECTION 76% COMPARISON:  Prior CT from 03/10/2016 as well as MRI from earlier the same day. FINDINGS: CTA NECK FINDINGS Aortic arch: Visualized aortic arch of normal caliber with normal branch pattern. Moderate calcified plaque within the arch itself and about the origin of the great vessels without hemodynamically significant stenosis. Extensive atheromatous plaque throughout the visualized subclavian arteries without high-grade stenosis. Right carotid system: Multifocal plaque throughout the right common carotid artery without high-grade stenosis. A centric calcified plaque about the right carotid bifurcation and proximal right ICA. There is a focal short-segment greater than 80% stenosis at the proximal right ICA (series 7, image 179). Right ICA tortuous but otherwise patent to the skull base without additional stenosis, dissection, or occlusion. Left carotid system: Multifocal plaque throughout the left common carotid artery without flow-limiting stenosis. Calcified atheromatous plaque about the proximal left ICA with associated relatively mild stenosis of 25-30%. Left ICA patent distally to the skull base without stenosis, dissection, or occlusion. Vertebral arteries: Both of the vertebral arteries arise from the subclavian arteries. Right vertebral artery occludes at its origin. Minimal scant distal reconstitution at the level of C2, likely via the muscular cutaneous branches (series 7, image 181). There is minimal thready flow distally into the skull base. Focal plaque at the origin of the left vertebral artery with severe ostial stenosis. Mild narrowing at the level of C4-5 and C5-6 due to extrinsic compression from uncovertebral disease. Left vertebral artery otherwise widely patent. Skeleton: No acute osseous abnormality. No worrisome lytic or blastic osseous lesions. Moderate cervical spondylolysis at C4-5 through C6-7. Median sternotomy wires noted. Other neck: No  acute soft tissue abnormality within the neck. Salivary glands normal. Thyroid normal. No adenopathy. Upper chest: Visualized upper chest demonstrates no acute abnormality. Centrilobular emphysema. Partially visualized lungs are clear. Review of the MIP images confirms the above findings CTA HEAD FINDINGS Anterior circulation: Petrous segments patent bilaterally. Calcified atheromatous plaque throughout the cavernous/supraclinoid segments bilaterally. Associated moderate to severe multifocal narrowing, right worse than left. ICA termini patent. A1 segments patent bilaterally. Right A1 segment slightly hypoplastic. Patent anterior communicating artery. Anterior cerebral arteries patent to their distal aspects without flow-limiting stenosis. M1 segments mildly irregular but patent without high-grade stenosis. No proximal M2 occlusion. Distal MCA branches well opacified and fairly symmetric. Extensive distal small vessel atheromatous irregularity. Posterior circulation: Dominant left vertebral artery widely patent to the vertebrobasilar junction without flow-limiting stenosis. Patent left PICA. Minimal scant flow within the right V4 segment, nearly occluded. Filling of the right PICA, likely via retrograde flow across the vertebrobasilar junction. Basilar artery patent proximally. Mild to moderate diffuse narrowing of the mid-distal basilar artery. Superior cerebral arteries patent bilaterally. Left PCA supplied via the basilar and is patent to its distal aspect. Hypoplastic right P1 with prominent right posterior communicating artery. Mild to moderate right P2 stenosis (series 8, image 97). Right PCA patent distally to its distal aspects. Venous sinuses: Patent. Anatomic variants: None significant. No aneurysm or vascular abnormality. Delayed phase: No abnormal enhancement. Review of the MIP images confirms the above findings IMPRESSION: 1. Negative CTA for large vessel occlusion. 2. Severe greater than 80%  short-segment proximal right ICA stenosis. 3. Extensive atherosclerotic change throughout the left carotid artery system without hemodynamically significant stenosis. Occlusion of the right vertebral artery at its origin, minimal scant thready distal reconstitution as above.  4. Severe stenosis at the origin of the left vertebral artery. Dominant left vertebral artery otherwise widely patent. 5. Extensive intracranial atherosclerosis as above, most notable within the carotid siphons and basilar artery. 6. Emphysema. Electronically Signed   By: Rise Mu M.D.   On: 03/11/2017 06:25   Mr Brain Wo Contrast  Result Date: 03/11/2017 CLINICAL DATA:  Initial evaluation for acute speech difficulty, right hand abnormality. EXAM: MRI HEAD WITHOUT CONTRAST TECHNIQUE: Multiplanar, multiecho pulse sequences of the brain and surrounding structures were obtained without intravenous contrast. COMPARISON:  Prior CT from 03/10/2017. FINDINGS: Brain: Diffuse prominence of the CSF containing spaces compatible with generalized cerebral atrophy. Minimal chronic microvascular ischemic changes for age. Encephalomalacia with gliosis within the left temporal occipital region consistent with remote left PCA territory infarct. Associated lacunar infarct within the left thalamus. Additional moderate-sized remote left posterior MCA territory infarct. Associated volume loss with ex vacuo dilatation of the left lateral ventricle. There are patchy multifocal small volume acute ischemic predominantly cortical infarcts involving the left frontoparietal region (series 3, images 29-39). No significant mass effect. No associated hemorrhage. No other evidence for acute or subacute ischemia. No mass lesion, midline shift or mass effect. No hydrocephalus. No extra-axial fluid collection. Major dural sinuses are grossly patent. Pituitary gland suprasellar region normal. Midline structures intact and normal. Previously question nodular density  near the foramen of Monro felt to be related to patient positioning. Vascular: Major intravascular flow voids are maintained at the skull base. Skull and upper cervical spine: Craniocervical junction normal. Upper cervical spine within normal limits. Bone marrow signal intensity within normal limits. No scalp soft tissue abnormality. Sinuses/Orbits: Globes and orbital soft tissues within normal limits. Paranasal sinuses are largely clear. No significant mastoid effusion. Inner ear structures grossly normal. Other: None. IMPRESSION: 1. Patchy small volume acute ischemic nonhemorrhagic left MCA territory cortical infarcts involving the left frontal and parietal lobes as above. 2. Moderate to large-sized remote left posterior MCA and PCA territory infarcts. 3. Age-related cerebral atrophy. Electronically Signed   By: Rise Mu M.D.   On: 03/11/2017 01:35   Dg Hand Complete Right  Result Date: 03/10/2017 CLINICAL DATA:  Hand pain, history of stroke EXAM: RIGHT HAND - COMPLETE 3+ VIEW COMPARISON:  None FINDINGS: Diffuse osseous demineralization. Transverse fracture at base of proximal phalanx RIGHT little finger with displacement, angulation, and deformity. Joint spaces preserved. No additional fracture, dislocation, or bone destruction. Dorsal soft tissue swelling overlying the MCP joints. IMPRESSION: Displaced and angulated fracture at base of proximal phalanx RIGHT little finger. Electronically Signed   By: Ulyses Southward M.D.   On: 03/10/2017 20:31   Dg Finger Little Right  Result Date: 03/11/2017 CLINICAL DATA:  Status post reduction of right fifth proximal phalanx fracture. Initial encounter. EXAM: RIGHT LITTLE FINGER 2+V COMPARISON:  None. FINDINGS: There is mildly improved alignment of the fracture through the base of the fifth proximal phalanx, with residual radial and volar displacement. Surrounding soft tissue swelling is noted. No new fractures are seen. IMPRESSION: Mildly improved alignment of  the fracture through the base of the fifth proximal phalanx, with residual radial and volar displacement. Electronically Signed   By: Roanna Raider M.D.   On: 03/11/2017 04:26   Vas US Carotid  Result Date: 03/12/2017 Carotid Arterial Duplex Study Indications:   CVA, Speech disturbance, Weakness and Facial droop. Risk Factors:  Hypertension, hyperlipidemia, current smoker, coronary artery                disease.  Other Factors: History of CABG 07/2012. Examination Guidelines: A complete evaluation includes B-mode imaging, spectral doppler, color doppler, and power doppler as needed of all accessible portions of each vessel. Bilateral testing is considered an integral part of a complete examination. Limited examinations for reoccurring indications may be performed as noted. Prior exam from 08/14/12 is available for comparison.  Right Carotid Findings: +----------+--------+--------+--------+--------+---------+           PSV cm/sEDV cm/sStenosisDescribeComments  +----------+--------+--------+--------+--------+---------+ CCA Prox  -76     -19             calcific          +----------+--------+--------+--------+--------+---------+ CCA Distal-114    -16                               +----------+--------+--------+--------+--------+---------+ ICA Prox  -107    -28             calcificShadowing +----------+--------+--------+--------+--------+---------+ ICA Distal-91     -18                               +----------+--------+--------+--------+--------+---------+ ECA       -102    -20                               +----------+--------+--------+--------+--------+---------+ +---------+--------+--------+------------+ VertebralPSV cm/sEDV cm/sNot assessed +---------+--------+--------+------------+  Left Carotid Findings: +----------+--------+--------+--------+--------+---------+           PSV cm/sEDV cm/sStenosisDescribeComments   +----------+--------+--------+--------+--------+---------+ CCA Prox  102     7               calcific          +----------+--------+--------+--------+--------+---------+ CCA Distal-84     -22                               +----------+--------+--------+--------+--------+---------+ ICA Prox  -74     -27             calcificShadowing +----------+--------+--------+--------+--------+---------+ ICA Distal-47     -19                               +----------+--------+--------+--------+--------+---------+ ECA       -96     -12                               +----------+--------+--------+--------+--------+---------+ +---------+--------+---+--------+---+ VertebralPSV cm/s-73EDV cm/s-16 +---------+--------+---+--------+---+  Final Interpretation: Right Carotid: Velocities in the right ICA are consistent with a 1-39% stenosis. Left Carotid: Velocities in the left ICA are consistent with a 1-39% stenosis. Vertebrals: Left vertebral artery was patent with antegrade flow. RIght             vertebral artery flow not insonated. *See table(s) above for measurements and observations.  Electronically signed by Delia Heady on 03/12/2017 at 12:22:29 PM.      Scheduled Meds: . apixaban  5 mg Oral BID  . atorvastatin  10 mg Oral q1800  . cholecalciferol  1,000 Units Oral Daily  . donepezil  5 mg Oral QHS   Continuous Infusions:   LOS: 1 day    Time spent: Total of 15 minutes spent with pt, greater than 50% of which was spent  in discussion of  treatment, counseling and coordination of care   Latrelle Dodrill, MD Pager: Text Page via [email or web address]   If 7PM-7AM, please contact night-coverage [email or web address] 03/13/2017, 3:56 PM

## 2017-03-13 NOTE — Care Management Note (Signed)
Case Management Note  [name]: [id number] Date of Birth: August 03, 1950  Subjective/Objective:  67 y.o. F s/p L MCA Infarct with expressive aphasia. CM will need to follow up with pt on Monday to actually schedule appt with new PCP as CM unable on Sunday. Has [facility] which offers multiple online and telephonic services to assist in this process which I shared with her. She lives in [place], [place] [employer] are abundant                   Action/Plan:CM will follow closely for disposition/discharge needs.    Expected Discharge Date:                  Expected Discharge Plan:     In-House Referral:  PCP / [facility]  Discharge planning Services  CM Consult, Other - See comment  Post Acute Care Choice:    Choice offered to:  Patient  DME Arranged:    DME Agency:     HH Arranged:    HH Agency:     Status of Service:  In process, will continue to follow  If discussed at Long Length of Stay Meetings, dates discussed:    Additional Comments:  Yvone Neu, RN 03/13/2017, 10:06 AM

## 2017-03-14 ENCOUNTER — Inpatient Hospital Stay (HOSPITAL_COMMUNITY)
Admission: RE | Admit: 2017-03-14 | Discharge: 2017-03-21 | DRG: 057 | Disposition: A | Discharge status: Home Health | Payer: Medicare Other | Source: Intra-hospital | Attending: Physical Medicine & Rehabilitation | Admitting: Physical Medicine & Rehabilitation

## 2017-03-14 ENCOUNTER — Other Ambulatory Visit: Payer: Self-pay

## 2017-03-14 ENCOUNTER — Encounter (HOSPITAL_COMMUNITY): Payer: Self-pay

## 2017-03-14 DIAGNOSIS — Z7982 Long term (current) use of aspirin: Secondary | ICD-10-CM

## 2017-03-14 DIAGNOSIS — E785 Hyperlipidemia, unspecified: Secondary | ICD-10-CM

## 2017-03-14 DIAGNOSIS — Z823 Family history of stroke: Secondary | ICD-10-CM

## 2017-03-14 DIAGNOSIS — H53469 Homonymous bilateral field defects, unspecified side: Secondary | ICD-10-CM | POA: Diagnosis present

## 2017-03-14 DIAGNOSIS — Z951 Presence of aortocoronary bypass graft: Secondary | ICD-10-CM | POA: Diagnosis not present

## 2017-03-14 DIAGNOSIS — Z8249 Family history of ischemic heart disease and other diseases of the circulatory system: Secondary | ICD-10-CM

## 2017-03-14 DIAGNOSIS — I2510 Atherosclerotic heart disease of native coronary artery without angina pectoris: Secondary | ICD-10-CM | POA: Diagnosis present

## 2017-03-14 DIAGNOSIS — F17210 Nicotine dependence, cigarettes, uncomplicated: Secondary | ICD-10-CM | POA: Diagnosis present

## 2017-03-14 DIAGNOSIS — I69319 Unspecified symptoms and signs involving cognitive functions following cerebral infarction: Secondary | ICD-10-CM

## 2017-03-14 DIAGNOSIS — I10 Essential (primary) hypertension: Secondary | ICD-10-CM | POA: Diagnosis present

## 2017-03-14 DIAGNOSIS — I69351 Hemiplegia and hemiparesis following cerebral infarction affecting right dominant side: Principal | ICD-10-CM

## 2017-03-14 DIAGNOSIS — H53461 Homonymous bilateral field defects, right side: Secondary | ICD-10-CM | POA: Diagnosis not present

## 2017-03-14 DIAGNOSIS — R4701 Aphasia: Secondary | ICD-10-CM | POA: Diagnosis not present

## 2017-03-14 DIAGNOSIS — S62609A Fracture of unspecified phalanx of unspecified finger, initial encounter for closed fracture: Secondary | ICD-10-CM

## 2017-03-14 DIAGNOSIS — I69320 Aphasia following cerebral infarction: Secondary | ICD-10-CM | POA: Diagnosis not present

## 2017-03-14 DIAGNOSIS — I480 Paroxysmal atrial fibrillation: Secondary | ICD-10-CM | POA: Diagnosis present

## 2017-03-14 DIAGNOSIS — S62616D Displaced fracture of proximal phalanx of right little finger, subsequent encounter for fracture with routine healing: Secondary | ICD-10-CM

## 2017-03-14 DIAGNOSIS — W19XXXD Unspecified fall, subsequent encounter: Secondary | ICD-10-CM | POA: Diagnosis present

## 2017-03-14 DIAGNOSIS — I252 Old myocardial infarction: Secondary | ICD-10-CM

## 2017-03-14 DIAGNOSIS — S62616A Displaced fracture of proximal phalanx of right little finger, initial encounter for closed fracture: Secondary | ICD-10-CM

## 2017-03-14 DIAGNOSIS — Z7901 Long term (current) use of anticoagulants: Secondary | ICD-10-CM

## 2017-03-14 DIAGNOSIS — Z8673 Personal history of transient ischemic attack (TIA), and cerebral infarction without residual deficits: Secondary | ICD-10-CM

## 2017-03-14 DIAGNOSIS — I69312 Visuospatial deficit and spatial neglect following cerebral infarction: Secondary | ICD-10-CM

## 2017-03-14 DIAGNOSIS — I63512 Cerebral infarction due to unspecified occlusion or stenosis of left middle cerebral artery: Secondary | ICD-10-CM | POA: Diagnosis not present

## 2017-03-14 DIAGNOSIS — S62616S Displaced fracture of proximal phalanx of right little finger, sequela: Secondary | ICD-10-CM

## 2017-03-14 MED ORDER — ACETAMINOPHEN 650 MG RE SUPP: 650.0000 mg | RECTAL | Status: DC | PRN

## 2017-03-14 MED ORDER — ACETAMINOPHEN 160 MG/5ML PO SOLN: 650.0000 mg | ORAL | Status: DC | PRN

## 2017-03-14 MED ORDER — VITAMIN D 1000 UNITS PO TABS
1000.0000 [IU] | ORAL_TABLET | Freq: Every day | ORAL | Status: DC
  Administered 2017-03-15 – 2017-03-21 (×7): 1000 [IU] via ORAL
  Filled 2017-03-14 (×7): qty 1

## 2017-03-14 MED ORDER — AMLODIPINE BESYLATE 10 MG PO TABS
10.0000 mg | ORAL_TABLET | Freq: Every day | ORAL | Status: DC
  Administered 2017-03-15 – 2017-03-21 (×7): 10 mg via ORAL
  Filled 2017-03-14 (×7): qty 1

## 2017-03-14 MED ORDER — DONEPEZIL HCL 10 MG PO TABS
5.0000 mg | ORAL_TABLET | Freq: Every day | ORAL | Status: DC
  Administered 2017-03-14 – 2017-03-20 (×7): 5 mg via ORAL
  Filled 2017-03-14 (×7): qty 1

## 2017-03-14 MED ORDER — ONDANSETRON HCL 4 MG/2ML IJ SOLN: 4.0000 mg | Freq: Four times a day (QID) | INTRAMUSCULAR | Status: DC | PRN

## 2017-03-14 MED ORDER — APIXABAN 5 MG PO TABS
5.0000 mg | ORAL_TABLET | Freq: Two times a day (BID) | ORAL | Status: DC
  Administered 2017-03-14 – 2017-03-21 (×14): 5 mg via ORAL
  Filled 2017-03-14 (×14): qty 1

## 2017-03-14 MED ORDER — ATORVASTATIN CALCIUM 10 MG PO TABS
10.0000 mg | ORAL_TABLET | Freq: Every day | ORAL | Status: DC
  Administered 2017-03-14 – 2017-03-20 (×7): 10 mg via ORAL
  Filled 2017-03-14 (×7): qty 1

## 2017-03-14 MED ORDER — SORBITOL 70 % SOLN: 30.0000 mL | Freq: Every day | Status: DC | PRN

## 2017-03-14 MED ORDER — ACETAMINOPHEN 325 MG PO TABS: 650.0000 mg | ORAL_TABLET | ORAL | Status: DC | PRN

## 2017-03-14 MED ORDER — LISINOPRIL 40 MG PO TABS
40.0000 mg | ORAL_TABLET | Freq: Every day | ORAL | Status: DC
  Administered 2017-03-15 – 2017-03-17 (×3): 40 mg via ORAL
  Filled 2017-03-14 (×4): qty 1

## 2017-03-14 MED ORDER — SENNOSIDES-DOCUSATE SODIUM 8.6-50 MG PO TABS
1.0000 | ORAL_TABLET | Freq: Every evening | ORAL | Status: DC | PRN
  Administered 2017-03-14 – 2017-03-15 (×2): 1 via ORAL
  Filled 2017-03-14 (×2): qty 1

## 2017-03-14 MED ORDER — ONDANSETRON HCL 4 MG PO TABS: 4.0000 mg | ORAL_TABLET | Freq: Four times a day (QID) | ORAL | Status: DC | PRN

## 2017-03-14 NOTE — Progress Notes (Signed)
Report called to nurse on 4W. Patient being wheeled by wheelchair to room [phone].

## 2017-03-14 NOTE — Progress Notes (Signed)
I have insurance approval to admit pt to inpt rehab today. I contacted pt's boyfriend and sister by phone and they are in agreement to admit. I have made Dr. Edward Jolly and RN CM aware. I will make the arrangements to admit today. [phone]

## 2017-03-14 NOTE — Progress Notes (Signed)
Occupational Therapy Treatment Patient Details Name: [name] [name] MRN: [id number] DOB: 14-Nov-1950 Today's Date: 03/14/2017    History of present illness Pt is a 67 y/o female admitted secondary to R weakness and fall. 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 Imaging revealed displaced fracture of the proximal phalanx on R little finger. Pt is s/p closed reduction and splinting. PMH includes CVA, CAD s/p CABG, HTN, a fib, and tobacco abuse.    OT comments  Pt making progress towards goals, presented sitting up in recliner, pleasant and willing to work with therapy. Pt completing room level functional mobility with HHA, functional transfers, and sitting/standing ADLs at sink this session, completing with MinA throughout. Pt continues to present with word finding difficulties and cognitive impairments, requiring increased time and cues express needs during session; Pt appearing motivated to work and improve with therapy. Feel Pt remains an appropriate candidate for CIR level services to maximize her safety and independence with ADLs and mobility prior to return home. Will continue to follow acutely to progress Pt towards established OT goals.   Follow Up Recommendations  CIR    Equipment Recommendations  Other (comment)(defer to next venue )          Precautions / Restrictions Precautions Precautions: Fall Restrictions Weight Bearing Restrictions: Yes RUE Weight Bearing: Non weight bearing Other Position/Activity Restrictions: Assume NWB on R hand given new fracture. No orders for WB status       Mobility Bed Mobility               General bed mobility comments: OOB in recliner upon arrival   Transfers Overall transfer level: Needs assistance Equipment used: 1 person hand held assist Transfers: Sit to/from Stand Sit to Stand: Total assist         General transfer comment: MinA to rise and  steady in standing; use of HHA as Pt reaching out for furniture in room to steady herself during mobility     Balance Overall balance assessment: Needs assistance Sitting-balance support: Feet supported;Single extremity supported Sitting balance-Leahy Scale: Fair     Standing balance support: During functional activity;Single extremity supported   Standing balance comment: Pt is able to briefly maintain static standing; overall reliant on LUE support and external support for steadying.                            ADL either performed or assessed with clinical judgement   ADL Overall ADL's : Needs assistance/impaired     Grooming: Wash/dry face;Oral care;Minimal assistance;Standing       Lower Body Bathing: Minimal assistance;Sit to/from stand Lower Body Bathing Details (indicate cue type and reason): Pt requesting to wash LB during session, completing from sit<>stand level at sink with MinA for standing balance          Toilet Transfer: Minimal assistance;Ambulation;Regular Toilet   Toileting- Clothing Manipulation and Hygiene: Minimal assistance;Sit to/from stand Toileting - Clothing Manipulation Details (indicate cue type and reason): min steadying assist during gown management and peri-care in standing after voiding bladder      Functional mobility during ADLs: Minimal assistance General ADL Comments: Pt completed toilet transfer, pericare, UB/LB bathing, and grooming tasks at sink; Pt mostly using LUE to complete tasks due to splint on RUE; Pt with word finding difficulties during session  Cognition Arousal/Alertness: Awake/alert Behavior During Therapy: WFL for tasks assessed/performed Overall Cognitive Status: No family/caregiver present to determine baseline cognitive functioning                                 General Comments: Unsure of baseline cognition; pt with word finding difficulties and required x3 cues  to recall not to get R hand wet during grooming tasks (due to splint on R digits); Pt able to verbalize "two" when asked about the month and verbalize Valentine's Day was within the month but was not able to correctly state the name of the month; able to select correct year when given options; oriented to place/situation given increased time                           Pertinent Vitals/ Pain       Pain Assessment: No/denies pain                                                          Frequency  Min 2X/week        Progress Toward Goals  OT Goals(current goals can now be found in the care plan section)  Progress towards OT goals: Progressing toward goals  Acute Rehab OT Goals Patient Stated Goal: CIR then home OT Goal Formulation: With patient Time For Goal Achievement: 03/26/17 Potential to Achieve Goals: Good  Plan Discharge plan remains appropriate                     AM-PAC PT "6 Clicks" Daily Activity     Outcome Measure   Help from another person eating meals?: A Little Help from another person taking care of personal grooming?: A Little Help from another person toileting, which includes using toliet, bedpan, or urinal?: A Little Help from another person bathing (including washing, rinsing, drying)?: A Little Help from another person to put on and taking off regular upper body clothing?: A Little Help from another person to put on and taking off regular lower body clothing?: A Little 6 Click Score: 18    End of Session Equipment Utilized During Treatment: Gait belt  OT Visit Diagnosis: Unsteadiness on feet (R26.81);Ataxia, unspecified (R27.0);Other abnormalities of gait and mobility (R26.89) Hemiplegia - Right/Left: Right   Activity Tolerance Patient tolerated treatment well   Patient Left in chair;with call bell/phone within reach;with chair alarm set   Nurse Communication Mobility status        Time: 5974-1638 OT  Time Calculation (min): 20 min  Charges: OT General Charges $OT Visit: 1 Visit OT Treatments $Self Care/Home Management : 8-22 mins  Marcy Siren, OT Pager [phone] 03/14/2017    Orlando Penner 03/14/2017, 9:53 AM

## 2017-03-14 NOTE — Discharge Summary (Signed)
Physician Discharge Summary  [name] [name]  WGN:[id number]  DOB: January 07, 1951  DOA: 03/11/2017 PCP: Patient, No Pcp Per  Admit date: 03/11/2017 Discharge date: 03/14/2017  Admitted From: Home  Disposition:  CIR   Recommendations for Outpatient Follow-up:  1. Follow up with PCP in 1-2 weeks 2. Please obtain BMP in 2-3 days to monitor potassium  Discharge Condition: Able CODE STATUS: Full code Diet recommendation: Heart Healthy   Brief/Interim Summary: For full details see H&P/Progress note, but in brief, [name] [name] is a 67 year old female with past medical history significant for prior CVA, CAD status post CABG, hypertension, PAF on Eliquis who presented to the emergency department complaining of slurred speech and facial droop. Upon ED evaluation MRI of the brain revealed patchy acute ischemic nonhemorrhagic left MCA infarct. Patient was admitted for neurological evaluation.  Patient also sustained a fall hurting her right hand x-rays was taken and showed displaced fracture of the 5th proximal phalanx, this was splinted in the ED.  Physical therapy evaluated patient and recommended inpatient rehabilitation.  Patient completed stroke workup with no significant findings.  She was deemed stable to begin inpatient rehab  Subjective: Patient seen and examined, she has no new complaints.  Eating breakfast.  Discharge Diagnoses/Hospital Course:  Acute ischemic left MCA infarct Felt to be secondary to A. fib with noncompliant with anticoagulation. Resultant expressive aphasia with right-sided weakness Echocardiogram and carotid Doppler unrevealing Lipid panel and A1c unremarkable Resume Eliquis, continue Lipitor PT recommending inpatient rehab  PAF Currently in sinus rhythm Has not been compliant with Eliquis Continue Eliquis, encourage compliance  CAD Asymptomatic Continue Lipitor  Displaced fracture left fifth proximal phalanx Status post reduction and splinting in the  ED Supportive treatment  Hypertension Initially allowed permissive HTN BP normalized, amlodipine and lisinopril resumed -BP remained stable Continue to hold clonidine as BP is in the lower normal side. Monitor BP closely  Hypokalemia/hypomagnesemia K upper borderline check BMP in 2-3 days  Mag normalized    All other chronic medical condition were stable during the hospitalization.  Patient was seen by physical therapy, recommending CIR On the day of the discharge the patient's vitals were stable, and no other acute medical condition were reported by patient. the patient was felt safe to be discharge to inpatient rehabilitation  Discharge Instructions  You were cared for by a hospitalist during your hospital stay. If you have any questions about your discharge medications or the care you received while you were in the hospital after you are discharged, you can call the unit and asked to speak with the hospitalist on call if the hospitalist that took care of you is not available. Once you are discharged, your primary care physician will handle any further medical issues. Please note that NO REFILLS for any discharge medications will be authorized once you are discharged, as it is imperative that you return to your primary care physician (or establish a relationship with a primary care physician if you do not have one) for your aftercare needs so that they can reassess your need for medications and monitor your lab values.  Discharge Instructions    Ambulatory referral to Neurology   Complete by:  As directed    An appointment is requested in approximately: 6 weeks Follow up with stroke clinic (Dr Pearlean Brownie preferred, if not available, then consider Sylvie Farrier, [facility] or Lucia Gaskins whoever is available) at [facility] in about 6-8 weeks. Thanks.   Call MD for:  difficulty breathing, headache or visual disturbances  Complete by:  As directed    Call MD for:  extreme fatigue   Complete by:  As directed     Call MD for:  hives   Complete by:  As directed    Call MD for:  persistant dizziness or light-headedness   Complete by:  As directed    Call MD for:  persistant nausea and vomiting   Complete by:  As directed    Call MD for:  redness, tenderness, or signs of infection (pain, swelling, redness, odor or green/yellow discharge around incision site)   Complete by:  As directed    Call MD for:  severe uncontrolled pain   Complete by:  As directed    Call MD for:  temperature >100.4   Complete by:  As directed    Diet - low sodium heart healthy   Complete by:  As directed    Increase activity slowly   Complete by:  As directed      Allergies as of 03/14/2017   No Known Allergies     Medication List    STOP taking these medications   cloNIDine 0.2 MG tablet Commonly known as:  CATAPRES   potassium chloride SA 20 MEQ tablet Commonly known as:  K-DUR,KLOR-CON     TAKE these medications   amLODipine 10 MG tablet Commonly known as:  NORVASC Take 10 mg by mouth daily.   apixaban 5 MG Tabs tablet Commonly known as:  ELIQUIS Take 1 tablet (5 mg total) by mouth 2 (two) times daily.   aspirin EC 325 MG tablet Take 325 mg by mouth daily.   atorvastatin 80 MG tablet Commonly known as:  LIPITOR Take 1 tablet (80 mg total) by mouth daily.   donepezil 5 MG tablet Commonly known as:  ARICEPT Take 1 tablet (5 mg total) by mouth at bedtime.   lisinopril 40 MG tablet Commonly known as:  PRINIVIL,ZESTRIL Take 40 mg by mouth daily.   Vitamin D 400 units capsule Take 800 Units by mouth daily.      Follow-up Information    Micki Riley, MD. Schedule an appointment as soon as possible for a visit in 6 week(s).   Specialties:  Neurology, Radiology Contact information: [address] [place] [address] [phone]          No Known Allergies  Consultations:  Neurology  Physical medicine and rehabilitation    Procedures/Studies: Ct Angio Head W  Or Wo Contrast  Result Date: 03/11/2017 CLINICAL DATA:  Initial evaluation for acute speech difficulty, right hand swelling. EXAM: CT ANGIOGRAPHY HEAD AND NECK TECHNIQUE: Multidetector CT imaging of the head and neck was performed using the standard protocol during bolus administration of intravenous contrast. Multiplanar CT image reconstructions and MIPs were obtained to evaluate the vascular anatomy. Carotid stenosis measurements (when applicable) are obtained utilizing NASCET criteria, using the distal internal carotid diameter as the denominator. CONTRAST:  50mL ISOVUE-370 IOPAMIDOL (ISOVUE-370) INJECTION 76% COMPARISON:  Prior CT from 03/10/2016 as well as MRI from earlier the same day. FINDINGS: CTA NECK FINDINGS Aortic arch: Visualized aortic arch of normal caliber with normal branch pattern. Moderate calcified plaque within the arch itself and about the origin of the great vessels without hemodynamically significant stenosis. Extensive atheromatous plaque throughout the visualized subclavian arteries without high-grade stenosis. Right carotid system: Multifocal plaque throughout the right common carotid artery without high-grade stenosis. A centric calcified plaque about the right carotid bifurcation and proximal right ICA. There is a focal  short-segment greater than 80% stenosis at the proximal right ICA (series 7, image 179). Right ICA tortuous but otherwise patent to the skull base without additional stenosis, dissection, or occlusion. Left carotid system: Multifocal plaque throughout the left common carotid artery without flow-limiting stenosis. Calcified atheromatous plaque about the proximal left ICA with associated relatively mild stenosis of 25-30%. Left ICA patent distally to the skull base without stenosis, dissection, or occlusion. Vertebral arteries: Both of the vertebral arteries arise from the subclavian arteries. Right vertebral artery occludes at its origin. Minimal scant distal  reconstitution at the level of C2, likely via the muscular cutaneous branches (series 7, image 181). There is minimal thready flow distally into the skull base. Focal plaque at the origin of the left vertebral artery with severe ostial stenosis. Mild narrowing at the level of C4-5 and C5-6 due to extrinsic compression from uncovertebral disease. Left vertebral artery otherwise widely patent. Skeleton: No acute osseous abnormality. No worrisome lytic or blastic osseous lesions. Moderate cervical spondylolysis at C4-5 through C6-7. Median sternotomy wires noted. Other neck: No acute soft tissue abnormality within the neck. Salivary glands normal. Thyroid normal. No adenopathy. Upper chest: Visualized upper chest demonstrates no acute abnormality. Centrilobular emphysema. Partially visualized lungs are clear. Review of the MIP images confirms the above findings CTA HEAD FINDINGS Anterior circulation: Petrous segments patent bilaterally. Calcified atheromatous plaque throughout the cavernous/supraclinoid segments bilaterally. Associated moderate to severe multifocal narrowing, right worse than left. ICA termini patent. A1 segments patent bilaterally. Right A1 segment slightly hypoplastic. Patent anterior communicating artery. Anterior cerebral arteries patent to their distal aspects without flow-limiting stenosis. M1 segments mildly irregular but patent without high-grade stenosis. No proximal M2 occlusion. Distal MCA branches well opacified and fairly symmetric. Extensive distal small vessel atheromatous irregularity. Posterior circulation: Dominant left vertebral artery widely patent to the vertebrobasilar junction without flow-limiting stenosis. Patent left PICA. Minimal scant flow within the right V4 segment, nearly occluded. Filling of the right PICA, likely via retrograde flow across the vertebrobasilar junction. Basilar artery patent proximally. Mild to moderate diffuse narrowing of the mid-distal basilar artery.  Superior cerebral arteries patent bilaterally. Left PCA supplied via the basilar and is patent to its distal aspect. Hypoplastic right P1 with prominent right posterior communicating artery. Mild to moderate right P2 stenosis (series 8, image 97). Right PCA patent distally to its distal aspects. Venous sinuses: Patent. Anatomic variants: None significant. No aneurysm or vascular abnormality. Delayed phase: No abnormal enhancement. Review of the MIP images confirms the above findings IMPRESSION: 1. Negative CTA for large vessel occlusion. 2. Severe greater than 80% short-segment proximal right ICA stenosis. 3. Extensive atherosclerotic change throughout the left carotid artery system without hemodynamically significant stenosis. Occlusion of the right vertebral artery at its origin, minimal scant thready distal reconstitution as above. 4. Severe stenosis at the origin of the left vertebral artery. Dominant left vertebral artery otherwise widely patent. 5. Extensive intracranial atherosclerosis as above, most notable within the carotid siphons and basilar artery. 6. Emphysema. Electronically Signed   By: Rise Mu M.D.   On: 03/11/2017 06:25   Dg Wrist Complete Right  Result Date: 03/10/2017 CLINICAL DATA:  RIGHT hand pain, stroke EXAM: RIGHT WRIST - COMPLETE 3+ VIEW COMPARISON:  None FINDINGS: Diffuse osseous demineralization. Joint spaces preserved. No acute fracture, dislocation, or bone destruction. IMPRESSION: No acute osseous abnormalities. Electronically Signed   By: Ulyses Southward M.D.   On: 03/10/2017 20:32   Ct Head Wo Contrast  Result Date: 03/10/2017 CLINICAL DATA:  Garbled speech and confusion EXAM: CT HEAD WITHOUT CONTRAST TECHNIQUE: Contiguous axial images were obtained from the base of the skull through the vertex without intravenous contrast. COMPARISON:  MRI brain 08/14/2012, CT brain 08/13/2012 FINDINGS: Brain: No acute territorial infarction, or hemorrhage is visualized. Possible  small 6 mm nodule or mass near the foramen of Monro. Encephalomalacia in the left parietal, posterior temporal and left occipital lobes consistent with old infarct. Mild atrophy. Ex vacuo dilatation of left lateral ventricle. Vascular: No hyperdense vessel.  Carotid vascular calcification. Skull: No fracture or suspicious lesion Sinuses/Orbits: No acute finding. Other: None IMPRESSION: 1. No definite CT evidence for acute intracranial abnormality. Old left parietal, posterior temporal and occipital infarcts. 2. Suspected 6 mm isodense nodule near the foramen of Monro, new since 2014, possible colloid nodule, however given isodense appearance and new finding suggest further evaluation with nonemergent MRI. Electronically Signed   By: Jasmine Pang M.D.   On: 03/10/2017 20:08   Ct Angio Neck W Or Wo Contrast  Result Date: 03/11/2017 CLINICAL DATA:  Initial evaluation for acute speech difficulty, right hand swelling. EXAM: CT ANGIOGRAPHY HEAD AND NECK TECHNIQUE: Multidetector CT imaging of the head and neck was performed using the standard protocol during bolus administration of intravenous contrast. Multiplanar CT image reconstructions and MIPs were obtained to evaluate the vascular anatomy. Carotid stenosis measurements (when applicable) are obtained utilizing NASCET criteria, using the distal internal carotid diameter as the denominator. CONTRAST:  50mL ISOVUE-370 IOPAMIDOL (ISOVUE-370) INJECTION 76% COMPARISON:  Prior CT from 03/10/2016 as well as MRI from earlier the same day. FINDINGS: CTA NECK FINDINGS Aortic arch: Visualized aortic arch of normal caliber with normal branch pattern. Moderate calcified plaque within the arch itself and about the origin of the great vessels without hemodynamically significant stenosis. Extensive atheromatous plaque throughout the visualized subclavian arteries without high-grade stenosis. Right carotid system: Multifocal plaque throughout the right common carotid artery without  high-grade stenosis. A centric calcified plaque about the right carotid bifurcation and proximal right ICA. There is a focal short-segment greater than 80% stenosis at the proximal right ICA (series 7, image 179). Right ICA tortuous but otherwise patent to the skull base without additional stenosis, dissection, or occlusion. Left carotid system: Multifocal plaque throughout the left common carotid artery without flow-limiting stenosis. Calcified atheromatous plaque about the proximal left ICA with associated relatively mild stenosis of 25-30%. Left ICA patent distally to the skull base without stenosis, dissection, or occlusion. Vertebral arteries: Both of the vertebral arteries arise from the subclavian arteries. Right vertebral artery occludes at its origin. Minimal scant distal reconstitution at the level of C2, likely via the muscular cutaneous branches (series 7, image 181). There is minimal thready flow distally into the skull base. Focal plaque at the origin of the left vertebral artery with severe ostial stenosis. Mild narrowing at the level of C4-5 and C5-6 due to extrinsic compression from uncovertebral disease. Left vertebral artery otherwise widely patent. Skeleton: No acute osseous abnormality. No worrisome lytic or blastic osseous lesions. Moderate cervical spondylolysis at C4-5 through C6-7. Median sternotomy wires noted. Other neck: No acute soft tissue abnormality within the neck. Salivary glands normal. Thyroid normal. No adenopathy. Upper chest: Visualized upper chest demonstrates no acute abnormality. Centrilobular emphysema. Partially visualized lungs are clear. Review of the MIP images confirms the above findings CTA HEAD FINDINGS Anterior circulation: Petrous segments patent bilaterally. Calcified atheromatous plaque throughout the cavernous/supraclinoid segments bilaterally. Associated moderate to severe multifocal narrowing, right worse than left. ICA termini patent.  A1 segments patent  bilaterally. Right A1 segment slightly hypoplastic. Patent anterior communicating artery. Anterior cerebral arteries patent to their distal aspects without flow-limiting stenosis. M1 segments mildly irregular but patent without high-grade stenosis. No proximal M2 occlusion. Distal MCA branches well opacified and fairly symmetric. Extensive distal small vessel atheromatous irregularity. Posterior circulation: Dominant left vertebral artery widely patent to the vertebrobasilar junction without flow-limiting stenosis. Patent left PICA. Minimal scant flow within the right V4 segment, nearly occluded. Filling of the right PICA, likely via retrograde flow across the vertebrobasilar junction. Basilar artery patent proximally. Mild to moderate diffuse narrowing of the mid-distal basilar artery. Superior cerebral arteries patent bilaterally. Left PCA supplied via the basilar and is patent to its distal aspect. Hypoplastic right P1 with prominent right posterior communicating artery. Mild to moderate right P2 stenosis (series 8, image 97). Right PCA patent distally to its distal aspects. Venous sinuses: Patent. Anatomic variants: None significant. No aneurysm or vascular abnormality. Delayed phase: No abnormal enhancement. Review of the MIP images confirms the above findings IMPRESSION: 1. Negative CTA for large vessel occlusion. 2. Severe greater than 80% short-segment proximal right ICA stenosis. 3. Extensive atherosclerotic change throughout the left carotid artery system without hemodynamically significant stenosis. Occlusion of the right vertebral artery at its origin, minimal scant thready distal reconstitution as above. 4. Severe stenosis at the origin of the left vertebral artery. Dominant left vertebral artery otherwise widely patent. 5. Extensive intracranial atherosclerosis as above, most notable within the carotid siphons and basilar artery. 6. Emphysema. Electronically Signed   By: Rise Mu M.D.   On:  03/11/2017 06:25   Mr Brain Wo Contrast  Result Date: 03/11/2017 CLINICAL DATA:  Initial evaluation for acute speech difficulty, right hand abnormality. EXAM: MRI HEAD WITHOUT CONTRAST TECHNIQUE: Multiplanar, multiecho pulse sequences of the brain and surrounding structures were obtained without intravenous contrast. COMPARISON:  Prior CT from 03/10/2017. FINDINGS: Brain: Diffuse prominence of the CSF containing spaces compatible with generalized cerebral atrophy. Minimal chronic microvascular ischemic changes for age. Encephalomalacia with gliosis within the left temporal occipital region consistent with remote left PCA territory infarct. Associated lacunar infarct within the left thalamus. Additional moderate-sized remote left posterior MCA territory infarct. Associated volume loss with ex vacuo dilatation of the left lateral ventricle. There are patchy multifocal small volume acute ischemic predominantly cortical infarcts involving the left frontoparietal region (series 3, images 29-39). No significant mass effect. No associated hemorrhage. No other evidence for acute or subacute ischemia. No mass lesion, midline shift or mass effect. No hydrocephalus. No extra-axial fluid collection. Major dural sinuses are grossly patent. Pituitary gland suprasellar region normal. Midline structures intact and normal. Previously question nodular density near the foramen of Monro felt to be related to patient positioning. Vascular: Major intravascular flow voids are maintained at the skull base. Skull and upper cervical spine: Craniocervical junction normal. Upper cervical spine within normal limits. Bone marrow signal intensity within normal limits. No scalp soft tissue abnormality. Sinuses/Orbits: Globes and orbital soft tissues within normal limits. Paranasal sinuses are largely clear. No significant mastoid effusion. Inner ear structures grossly normal. Other: None. IMPRESSION: 1. Patchy small volume acute ischemic  nonhemorrhagic left MCA territory cortical infarcts involving the left frontal and parietal lobes as above. 2. Moderate to large-sized remote left posterior MCA and PCA territory infarcts. 3. Age-related cerebral atrophy. Electronically Signed   By: Rise Mu M.D.   On: 03/11/2017 01:35   Dg Hand Complete Right  Result Date: 03/10/2017 CLINICAL DATA:  Hand pain,  history of stroke EXAM: RIGHT HAND - COMPLETE 3+ VIEW COMPARISON:  None FINDINGS: Diffuse osseous demineralization. Transverse fracture at base of proximal phalanx RIGHT little finger with displacement, angulation, and deformity. Joint spaces preserved. No additional fracture, dislocation, or bone destruction. Dorsal soft tissue swelling overlying the MCP joints. IMPRESSION: Displaced and angulated fracture at base of proximal phalanx RIGHT little finger. Electronically Signed   By: Ulyses Southward M.D.   On: 03/10/2017 20:31   Dg Finger Little Right  Result Date: 03/11/2017 CLINICAL DATA:  Status post reduction of right fifth proximal phalanx fracture. Initial encounter. EXAM: RIGHT LITTLE FINGER 2+V COMPARISON:  None. FINDINGS: There is mildly improved alignment of the fracture through the base of the fifth proximal phalanx, with residual radial and volar displacement. Surrounding soft tissue swelling is noted. No new fractures are seen. IMPRESSION: Mildly improved alignment of the fracture through the base of the fifth proximal phalanx, with residual radial and volar displacement. Electronically Signed   By: Roanna Raider M.D.   On: 03/11/2017 04:26   Vas US Carotid  Result Date: 03/12/2017 Carotid Arterial Duplex Study Indications:   CVA, Speech disturbance, Weakness and Facial droop. Risk Factors:  Hypertension, hyperlipidemia, current smoker, coronary artery                disease. Other Factors: History of CABG 07/2012. Examination Guidelines: A complete evaluation includes B-mode imaging, spectral doppler, color doppler, and power  doppler as needed of all accessible portions of each vessel. Bilateral testing is considered an integral part of a complete examination. Limited examinations for reoccurring indications may be performed as noted. Prior exam from 08/14/12 is available for comparison.  Right Carotid Findings: +----------+--------+--------+--------+--------+---------+           PSV cm/sEDV cm/sStenosisDescribeComments  +----------+--------+--------+--------+--------+---------+ CCA Prox  -76     -19             calcific          +----------+--------+--------+--------+--------+---------+ CCA Distal-114    -16                               +----------+--------+--------+--------+--------+---------+ ICA Prox  -107    -28             calcificShadowing +----------+--------+--------+--------+--------+---------+ ICA Distal-91     -18                               +----------+--------+--------+--------+--------+---------+ ECA       -102    -20                               +----------+--------+--------+--------+--------+---------+ +---------+--------+--------+------------+ VertebralPSV cm/sEDV cm/sNot assessed +---------+--------+--------+------------+  Left Carotid Findings: +----------+--------+--------+--------+--------+---------+           PSV cm/sEDV cm/sStenosisDescribeComments  +----------+--------+--------+--------+--------+---------+ CCA Prox  102     7               calcific          +----------+--------+--------+--------+--------+---------+ CCA Distal-84     -22                               +----------+--------+--------+--------+--------+---------+ ICA Prox  -74     -27             calcificShadowing +----------+--------+--------+--------+--------+---------+ ICA  Distal-47     -19                               +----------+--------+--------+--------+--------+---------+ ECA       -96     -12                                +----------+--------+--------+--------+--------+---------+ +---------+--------+---+--------+---+ VertebralPSV cm/s-73EDV cm/s-16 +---------+--------+---+--------+---+  Final Interpretation: Right Carotid: Velocities in the right ICA are consistent with a 1-39% stenosis. Left Carotid: Velocities in the left ICA are consistent with a 1-39% stenosis. Vertebrals: Left vertebral artery was patent with antegrade flow. RIght             vertebral artery flow not insonated. *See table(s) above for measurements and observations.  Electronically signed by Delia Heady on 03/12/2017 at 12:22:29 PM.       Discharge Exam: Vitals:   03/14/17 1033 03/14/17 1402  BP: 108/62 (!) 104/58  Pulse: 72 85  Resp: 15 16  Temp: 98.7 F (37.1 C) 98.7 F (37.1 C)  SpO2: 100% 100%   Vitals:   03/14/17 0110 03/14/17 0541 03/14/17 1033 03/14/17 1402  BP: 136/72 (!) 143/73 108/62 (!) 104/58  Pulse: 74 72 72 85  Resp: 18 18 15 16   Temp: 97.7 F (36.5 C) 98.5 F (36.9 C) 98.7 F (37.1 C) 98.7 F (37.1 C)  TempSrc: Oral Oral Oral Oral  SpO2: 100% 100% 100% 100%  Weight:      Height:        General: Pt is alert, awake, not in acute distress Cardiovascular: RRR, S1/S2 +, no rubs, no gallops Respiratory: CTA bilaterally, no wheezing, no rhonchi Abdominal: Soft, NT, ND, bowel sounds + Extremities: no edema, right fifth phalanx splinted Neuro: Expressive aphasia, strength 4 out of 5 on the right  The results of significant diagnostics from this hospitalization (including imaging, microbiology, ancillary and laboratory) are listed below for reference.     Microbiology: No results found for this or any previous visit (from the past 240 hour(s)).   Labs: BNP (last 3 results) No results for input(s): BNP in the last 8760 hours. Basic Metabolic Panel: Recent Labs  Lab 03/10/17 1932 03/11/17 0447 03/13/17 0428  NA 142 141 137  K 3.4* 3.3* 5.3*  CL 106 106 107  CO2 23 23 22   GLUCOSE 85 82 89  BUN 10  9 8   CREATININE 0.85 0.79 0.72  CALCIUM 9.5 9.2 8.9  MG  --  1.6* 1.9   Liver Function Tests: Recent Labs  Lab 03/10/17 1932  AST 32  ALT 24  ALKPHOS 71  BILITOT 0.5  PROT 7.1  ALBUMIN 4.0   No results for input(s): LIPASE, AMYLASE in the last 168 hours. No results for input(s): AMMONIA in the last 168 hours. CBC: Recent Labs  Lab 03/10/17 1932 03/11/17 0447  WBC 6.1 6.0  NEUTROABS 2.8  --   HGB 13.5 11.9*  HCT 40.1 35.9*  MCV 93.5 92.8  PLT 249 230   Cardiac Enzymes: No results for input(s): CKTOTAL, CKMB, CKMBINDEX, TROPONINI in the last 168 hours. BNP: Invalid input(s): POCBNP CBG: No results for input(s): GLUCAP in the last 168 hours. D-Dimer No results for input(s): DDIMER in the last 72 hours. Hgb A1c No results for input(s): HGBA1C in the last 72 hours. Lipid Profile No results for input(s): CHOL, HDL, LDLCALC, TRIG, CHOLHDL,  LDLDIRECT in the last 72 hours. Thyroid function studies No results for input(s): TSH, T4TOTAL, T3FREE, THYROIDAB in the last 72 hours.  Invalid input(s): FREET3 Anemia work up No results for input(s): VITAMINB12, FOLATE, FERRITIN, TIBC, IRON, RETICCTPCT in the last 72 hours. Urinalysis    Component Value Date/Time   COLORURINE YELLOW 03/12/2017 0304   APPEARANCEUR HAZY (A) 03/12/2017 0304   LABSPEC 1.026 03/12/2017 0304   PHURINE 5.0 03/12/2017 0304   GLUCOSEU NEGATIVE 03/12/2017 0304   HGBUR SMALL (A) 03/12/2017 0304   BILIRUBINUR NEGATIVE 03/12/2017 0304   KETONESUR 20 (A) 03/12/2017 0304   PROTEINUR NEGATIVE 03/12/2017 0304   UROBILINOGEN 4.0 (H) 08/16/2012 0205   NITRITE NEGATIVE 03/12/2017 0304   LEUKOCYTESUR NEGATIVE 03/12/2017 0304   Sepsis Labs Invalid input(s): PROCALCITONIN,  WBC,  LACTICIDVEN Microbiology No results found for this or any previous visit (from the past 240 hour(s)).   Time coordinating discharge: 35 minutes  SIGNED:  Latrelle Dodrill, MD  [facility] 03/14/2017, 3:15 PM  Pager please  text page via  [email or web address]

## 2017-03-14 NOTE — Progress Notes (Signed)
Standley Brooking, RN  Rehab Admission Coordinator  Physical Medicine and Rehabilitation  PMR Pre-admission  Signed  Date of Service:  03/14/2017 3:26 PM       Related encounter: ED to Hosp-Admission (Current) from 03/11/2017 in [facility]      Signed           [] Hide copied text  [] Hover for details   PMR Admission Coordinator Pre-Admission Assessment  Patient: [name] [name] is an 67 y.o., female MRN: [id number] DOB: 1950/12/28 Height: 5\' 4"  (162.6 cm) Weight: 43.1 kg (95 lb)                                                                                                                                                  Insurance Information HMO:     PPO:     PCP:      IPA:      80/20:      OTHER: medicare advantage plan PRIMARY: [employer]      Policy#: [id number]      Subscriber: pt CM Name: Gweneth Dimitri      Phone#: [phone]     Fax#: [phone] Pre-Cert#: [id number] for 7 days with f/u Rebeca Alert phone [phone] fax [phone]      Employer: disabled Benefits:  Phone #: [phone]     Name: 03/14/2017 Eff. Date: 02/01/2017     Deduct: none      Out of Pocket Max: [phone]      Life Max: none CIR: $500 co pay per admission with Medicaid      SNF: no co pay per day days 1 to 20; $170.50 co pay per day days 21 to 100 Outpatient: no co pay per visit     Co-Pay: visits per medical neccesity Home Health: 100%      Co-Pay: visits per medical neccesity DME: 80%     Co-Pay: 20% Providers: in network  SECONDARY: Medicaid      Policy#: TBD policy # to be verified with pt's sister, Drinda Butts      Subscriber: pt  Medicaid Application Date:       Case Manager:  Disability Application Date:       Case Worker:   Emergency Contact Information        Contact Information    Name Relation Home Work Mobile   [place] Sister   [phone]   Bynum,Lillie Mother [phone]     Ardyth Harps Significant other    [phone]   Halford Decamp Relative [phone]       Current Medical History  Patient Admitting Diagnosis: left MCA and PCA territory infarcts  History of Present Illness:  NOI:BBCWUG K Gravesis a 67 y.o.right handed femalewith past medical history of tobacco abuse, CVAJuly 2014with residual right-sided weaknessstatus post loop recorder placement maintained on Eliquisas well as aspirin(received CIR at  that time),PAF,STEMI status post CABG,remote history ofseizures(currently not on any medications),?dementia(pt unsure why she was started on meds),hypertension. Patient lives with her boyfriend of 20 years who works 12 hour shifts. Patient reported to be independent prior to admission. Presented on 2/7/19with acute left MCA stroke. Per chart review, sister, and patient,patient was noted to have slurred speech and facial droop. Symptoms appear to have started on 2/6 when she felt right-sided weakness and had a fall. She presented to the ED on 2/8. X-rays of right hand were taken which showed fracture of right proximal fifth phalanx. CT head was obtained, reviewed, relatively unremarkable for acute intracranial process. MRI was done which showed left MCA and PCA territory infarct with patchy small acute ischemic nonhemorrhagic left MCA infarcts.Patient did not receive TPA.Neurology was consult and stroke workup initiated.Echocardiogram with ejection fraction of 60% no wall motion abnormalities.Patient with resultant exacerbation of right-sided weakness, cognitive deficits, balance impairments, expressive aphasia, and visual deficits. Hospital coursefurthercomplicated by hypokalemia and acute blood loss anemia.Tolerating a regular diet. Underwent closed reduction and splinting for displaced fracture of the proximal phalanx on the right little finger and nonweightbearing on right hand.   Total: 6 NIHSS  Past Medical History      Past Medical History:  Diagnosis Date  .  Acute ischemic left MCA stroke (HCC) 03/11/2017  . Hyperlipidemia LDL goal < 70 07/24/2012  . Hypertension   . 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 07/25/12 07/28/2012   POSTOPERATIVE DIAGNOSIS: Severe 3-vessel coronary disease, status post  myocardial infarction.  PROCEDURE: Median sternotomy, extracorporeal circulation, coronary  artery bypass grafting x5 (left internal mammary artery to LAD,  sequential saphenous vein graft to obtuse marginal 1, obtuse marginal 2,  and posterior descending with Y graft to 1st diagonal), endoscopic vein  harvest, left thigh.    . Seizures (HCC)    "years ago" (03/11/2017)  . STEMI (ST elevation myocardial infarction), secondary to disease of RCA, with disease of LAD and diag. branch and chronic non dominant LCX occlusion for CABG  07/21/2012  . Stroke [facility]) ?08/2012   "right sided weakness; speech isssues since" (03/11/2017)  . Tobacco abuse 07/21/2012    Family History  family history includes CAD in her brother; Hypertension in her mother; Stroke in her brother.  Prior Rehab/Hospitalizations:  Has the patient had major surgery during 100 days prior to admission? No  CIR 2014  Current Medications   Current Facility-Administered Medications:  .  acetaminophen (TYLENOL) tablet 650 mg, 650 mg, Oral, Q4H PRN **OR** acetaminophen (TYLENOL) solution 650 mg, 650 mg, Per Tube, Q4H PRN **OR** acetaminophen (TYLENOL) suppository 650 mg, 650 mg, Rectal, Q4H PRN, Opyd, Timothy S, MD .  amLODipine (NORVASC) tablet 10 mg, 10 mg, Oral, Daily, Randel Pigg, Dorma Russell, MD, 10 mg at 03/14/17 2956 .  apixaban (ELIQUIS) tablet 5 mg, 5 mg, Oral, BID, Randel Pigg, Dorma Russell, MD, 5 mg at 03/14/17 0851 .  atorvastatin (LIPITOR) tablet 10 mg, 10 mg, Oral, q1800, Costello, Mary A, NP, 10 mg at 03/13/17 1653 .  cholecalciferol (VITAMIN D) tablet 1,000 Units, 1,000 Units, Oral, Daily, Opyd, Lavone Neri, MD, 1,000 Units at 03/14/17 [phone] .  donepezil (ARICEPT) tablet 5  mg, 5 mg, Oral, QHS, Opyd, Lavone Neri, MD, 5 mg at 03/13/17 2137 .  lisinopril (PRINIVIL,ZESTRIL) tablet 40 mg, 40 mg, Oral, Daily, Randel Pigg, Dorma Russell, MD, 40 mg at 03/14/17 0851 .  senna-docusate (Senokot-S) tablet 1 tablet, 1 tablet, Oral, QHS PRN, Opyd, Lavone Neri, MD  Patients Current  Diet: Fall precautions Diet regular Room service appropriate? Yes; Fluid consistency: Thin Diet - low sodium heart healthy  Precautions / Restrictions Precautions Precautions: Fall Precaution Comments: expressive aphasis Restrictions Weight Bearing Restrictions: Yes RUE Weight Bearing: Non weight bearing Other Position/Activity Restrictions: R pinky in splint   Has the patient had 2 or more falls or a fall with injury in the past year?No  Prior Activity Level Community (5-7x/wk): Independent pta; does not drive  Journalist, newspaper / Equipment Home Assistive Devices/Equipment: Grab bars in shower, Grab bars around toilet, Hand-held shower hose  Prior Device Use: Indicate devices/aids used by the patient prior to current illness, exacerbation or injury? None of the above  Prior Functional Level Prior Function Level of Independence: Independent Comments: Pt reports she was independent  Self Care: Did the patient need help bathing, dressing, using the toilet or eating?  Independent  Indoor Mobility: Did the patient need assistance with walking from room to room (with or without device)? Independent  Stairs: Did the patient need assistance with internal or external stairs (with or without device)? Independent  Functional Cognition: Did the patient need help planning regular tasks such as shopping or remembering to take medications? Needed some help  Current Functional Level Cognition  Arousal/Alertness: Awake/alert Overall Cognitive Status: Impaired/Different from baseline(unsure of baseline level of cognition) Orientation Level: Oriented to person, Oriented to place, Oriented  to situation, Disoriented to time Following Commands: Follows one step commands with increased time, Follows multi-step commands inconsistently Safety/Judgement: Decreased awareness of safety, Decreased awareness of deficits(R sided inattention) General Comments: pt with significant word finding difficulties, pt with noted R sided inattention as patient only turns to the L despite v/c's to turn R. Pt difficulty sequencing multi step commands ie. turn around and stop at the diamond, do a figure 8 around these cones Attention: Selective Selective Attention: Appears intact Selective Attention Impairment: Verbal basic, Functional basic Memory: Appears intact Awareness: Appears intact Problem Solving: Appears intact Safety/Judgment: Appears intact    Extremity Assessment (includes Sensation/Coordination)  Upper Extremity Assessment: Generalized weakness, RUE deficits/detail RUE Deficits / Details: 4th and 5th digits and medial palm in bandage. grossly 4-/5. Imparied coordination, tends to use whole arm movements vs. isolated.  RUE Coordination: decreased fine motor, decreased gross motor  Lower Extremity Assessment: Defer to PT evaluation RLE Deficits / Details: Grossly 4-/5 throughout. Noted Coordination difficulties with RLE during gait.  RLE Coordination: decreased gross motor, decreased fine motor    ADLs  Overall ADL's : Needs assistance/impaired Eating/Feeding: Set up, Sitting, Cueing for compensatory techinques Grooming: Wash/dry face, Oral care, Minimal assistance, Standing Upper Body Bathing: Minimal assistance, Sitting Lower Body Bathing: Minimal assistance, Sit to/from stand Lower Body Bathing Details (indicate cue type and reason): Pt requesting to wash LB during session, completing from sit<>stand level at sink with MinA for standing balance  Upper Body Dressing : Minimal assistance, Sitting Lower Body Dressing: Minimal assistance, Sit to/from stand Toilet Transfer: Minimal  assistance, Ambulation, Regular Toilet Toileting- Clothing Manipulation and Hygiene: Minimal assistance, Sit to/from stand Toileting - Clothing Manipulation Details (indicate cue type and reason): min steadying assist during gown management and peri-care in standing after voiding bladder  Tub/ Shower Transfer: Minimal assistance, Ambulation, 3 in 1, Rolling walker Functional mobility during ADLs: Minimal assistance General ADL Comments: Pt completed toilet transfer, pericare, UB/LB bathing, and grooming tasks at sink; Pt mostly using LUE to complete tasks due to splint on RUE; Pt with word finding difficulties during session  Mobility  Overal bed mobility: Needs Assistance Bed Mobility: Supine to Sit Supine to sit: Min guard, HOB elevated Sit to supine: Min assist General bed mobility comments: OOB in recliner upon arrival    Transfers  Overall transfer level: Needs assistance Equipment used: 1 person hand held assist Transfers: Sit to/from Stand Sit to Stand: Min assist General transfer comment: minA to power up, v/c's to not use R UE to push up    Ambulation / Gait / Stairs / Wheelchair Mobility  Ambulation/Gait Ambulation/Gait assistance: Architect (Feet): 250 Feet Assistive device: 1 person hand held assist Gait Pattern/deviations: Step-through pattern, Decreased stride length General Gait Details: pt unsteady with shuffle like gait pattern, pt requires max directional v/c's due to poor navigational skills Gait velocity: slow Gait velocity interpretation: Below normal speed for age/gender Stairs: Yes Stairs assistance: Min assist Stair Management: One rail Left, Forwards, Step to pattern Number of Stairs: 10 General stair comments: pt with difficulty clearing R foot on stairs. v/c's to complete step to versus alternating step pattern to minimize falls risk    Posture / Balance Balance Overall balance assessment: Needs assistance Sitting-balance  support: Feet supported, Single extremity supported Sitting balance-Leahy Scale: Fair Standing balance support: During functional activity, Single extremity supported Standing balance-Leahy Scale: Poor Standing balance comment: pt requires physical assist to maintain balance High level balance activites: Backward walking High Level Balance Comments: Very unsteady when performing backwards walking. Required cues for sequencing.  Standardized Balance Assessment Standardized Balance Assessment : Dynamic Gait Index Dynamic Gait Index Level Surface: Moderate Impairment Change in Gait Speed: Moderate Impairment Gait with Horizontal Head Turns: Mild Impairment Gait with Vertical Head Turns: Mild Impairment Gait and Pivot Turn: Moderate Impairment Step Over Obstacle: Mild Impairment Step Around Obstacles: Mild Impairment Steps: Moderate Impairment Total Score: 12    Special needs/care consideration BiPAP/CPAP  N/a CPM  N/a Continuous Drip IV  N/a Dialysis  N/a Life Vest  N/a Oxygen  N/a Special Bed  N/a Trach Size  N/a Wound Vac n/a Skin intact Bowel mgmt: continent LBM 03/12/17 Bladder mgmt: continent Diabetic mgmt  N/a   Previous Home Environment Living Arrangements: Spouse/significant other(boyfriend of 20 years)  Lives With: Significant other Available Help at Discharge: (boyfriend states his stepdaughter, [place], may stay with her) Type of Home: Apartment Home Layout: One level Home Access: Level entry Bathroom Shower/Tub: Tub/shower unit, Engineer, building services: Standard Bathroom Accessibility: Yes How Accessible: Accessible via walker Home Care Services: No Additional Comments: boyfriend confirmed   Discharge Living Setting Plans for Discharge Living Setting: Lives with (comment), Apartment(with boyfriend, Rodrigo Ran) Type of Home at Discharge: Apartment Discharge Home Layout: One level Discharge Home Access: Level entry Discharge Bathroom Shower/Tub:  Tub/shower unit, Curtain Discharge Bathroom Toilet: Standard Discharge Bathroom Accessibility: Yes How Accessible: Accessible via walker Does the patient have any problems obtaining your medications?: No  Social/Family/Support Systems Patient Roles: Partner Contact Information: Molly Maduro, boyfriend and sister, Drinda Butts Anticipated Caregiver: Molly Maduro and his stepdaughter, Liborio Nixon Anticipated Industrial/product designer Information: see above Ability/Limitations of Caregiver: boyfriend works 12 hr shifts and rides bike to work Medical laboratory scientific officer: (boyfriend and stepdaughter to cover near 24/7 per Molly Maduro) Discharge Plan Discussed with Primary Caregiver: Yes Is Caregiver In Agreement with Plan?: Yes Does Caregiver/Family have Issues with Lodging/Transportation while Pt is in Rehab?: No  Goals/Additional Needs Patient/Family Goal for Rehab: Mod I to supervision with PT, OT, and SLP Expected length of stay: ELOS 7 to 10 days Pt/Family Agrees to Admission and willing  to participate: Yes Program Orientation Provided & Reviewed with Pt/Caregiver Including Roles  & Responsibilities: Yes  Decrease burden of Care through IP rehab admission: n/a  Possible need for SNF placement upon discharge:not anticipated  Patient Condition: This patient's condition remains as documented in the consult dated 03/12/2017, in which the Rehabilitation Physician determined and documented that the patient's condition is appropriate for intensive rehabilitative care in an inpatient rehabilitation facility. Will admit to inpatient rehab today.  Preadmission Screen Completed By:  Clois Dupes, 03/14/2017 3:26 PM ______________________________________________________________________   Discussed status with Dr. Allena Katz on 03/14/2017 at  1536 and received telephone approval for admission today.  Admission Coordinator:  Clois Dupes, time 1610 Date 03/14/2017             Cosigned by: Marcello Fennel, MD at 03/14/2017 3:55 PM  Revision History

## 2017-03-14 NOTE — Progress Notes (Signed)
Patient was informed about rehab process including patient safety plan aand rehab booklet.

## 2017-03-14 NOTE — H&P (Signed)
Physical Medicine and Rehabilitation Admission H&P    Chief Complaint  Patient presents with  . Aphasia  : HPI: [name] [name] is a 67 y.o. 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 Patient lives with her boyfriend of 20 years who works 12 hour shifts. Patient reported to be independent prior to admission. One level home with level entry. Presented on 03/10/17 with acute left MCA stroke. Per chart review, sister, and patient, patient was noted to have slurred speech and facial droop. Symptoms appear to have started on 2/6 when she felt right-sided weakness and had a fall. She presented to the ED on 2/8. X-rays of right hand were taken which showed fracture of right proximal fifth phalanx. CT head was obtained, reviewed, relatively unremarkable for acute intracranial process. MRI was done which showed left MCA and PCA territory infarct with patchy small acute ischemic nonhemorrhagic left MCA infarcts. Patient did not receive TPA. Neurology was consult and stroke workup initiated. Echocardiogram with ejection fraction of 60% no wall motion abnormalities. Patient with resultant exacerbation of right-sided weakness, cognitive deficits, balance impairments, expressive aphasia, and visual deficits. Hospital course further complicated by hypokalemia and acute blood loss anemia.  Tolerating a regular diet. Underwent closed reduction and splinting for displaced fracture of the proximal phalanx on the right little finger and nonweightbearing on right hand. Physical and occupational therapy evaluations completed with recommendations of physical medicine rehabilitation consult. Patient  was admitted for a comprehensive rehabilitation program    Review of Systems  Constitutional: Negative for fever.  HENT: Negative for hearing loss.   Eyes: Negative for photophobia.       Right peripheral vision loss  Respiratory: Negative for cough and shortness of breath.   Cardiovascular: Negative for chest pain.  Gastrointestinal: Positive for constipation. Negative for nausea and vomiting.  Genitourinary: Negative for dysuria and hematuria.  Musculoskeletal: Positive for falls, joint pain and myalgias.  Skin: Negative for rash.  Neurological: Positive for dizziness, speech change and focal weakness.  Psychiatric/Behavioral: Positive for memory loss.  All other systems reviewed and are negative.  Past Medical History:  Diagnosis Date  . Acute ischemic left MCA stroke ([place]) 03/11/2017  . Hyperlipidemia LDL goal < 70 07/24/2012  . Hypertension   . 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 07/25/12 07/28/2012   POSTOPERATIVE DIAGNOSIS: Severe 3-vessel coronary disease, status post  myocardial infarction.  PROCEDURE: Median sternotomy, extracorporeal circulation, coronary  artery bypass grafting x5 (left internal mammary artery to LAD,  sequential saphenous vein graft to obtuse marginal 1, obtuse marginal 2,  and posterior descending with Y graft to 1st diagonal), endoscopic vein  harvest, left thigh.    . Seizures ([place])    "years ago" (03/11/2017)  . STEMI (ST elevation myocardial infarction), secondary to disease of RCA, with disease of LAD and diag. branch and chronic non dominant LCX occlusion for CABG  07/21/2012  . Stroke [facility]) ?08/2012   "right sided weakness; speech isssues since" (03/11/2017)  . Tobacco abuse 07/21/2012   Past Surgical History:  Procedure Laterality Date  . CARDIAC CATHETERIZATION    . CESAREAN SECTION  X 1  . CORONARY ARTERY BYPASS GRAFT N/A 07/25/2012  Procedure: CORONARY ARTERY BYPASS GRAFTING (CABG) times five with Left  Endoscopic Saphenous  Vein Harvest and Left Internal  Mammary ;  Surgeon: Melrose Nakayama, MD;  Location: [place];  Service: Open Heart Surgery;  Laterality: N/A;  . DILATION AND CURETTAGE OF UTERUS    . 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 LEFT HEART CATHETERIZATION WITH CORONARY ANGIOGRAM N/A 07/21/2012   Procedure: LEFT HEART CATHETERIZATION WITH CORONARY ANGIOGRAM;  Surgeon: Lorretta Harp, MD;  Location: [facility] CATH LAB;  Service: Cardiovascular;  Laterality: N/A;  . LOOP RECORDER IMPLANT  08-15-2012   Medtronic LinQ implanted by Dr Rayann Heman for cryptogenic stroke  . TEE WITHOUT CARDIOVERSION N/A 08/15/2012   Procedure: TRANSESOPHAGEAL ECHOCARDIOGRAM (TEE);  Surgeon: Josue Hector, MD;  Location[facility] ENDOSCOPY;  Service: Cardiovascular;  Laterality: N/A;  . TUBAL LIGATION     Family History  Problem Relation Age of Onset  . Hypertension Mother   . Stroke Brother        Died at age 72 secondary to MI  . CAD Brother    Social History:  reports that she has been smoking cigarettes.  She started smoking about 4 years ago. She has a 16.50 pack-year smoking history. she has never used smokeless tobacco. She reports that she does not drink alcohol or use drugs. Allergies: No Known Allergies Medications Prior to Admission  Medication Sig Dispense Refill  . amLODipine (NORVASC) 10 MG tablet Take 10 mg by mouth daily.    Marland Kitchen apixaban (ELIQUIS) 5 MG TABS tablet Take 1 tablet (5 mg total) by mouth 2 (two) times daily. 60 tablet 3  . aspirin EC 325 MG tablet Take 325 mg by mouth daily.    Marland Kitchen atorvastatin (LIPITOR) 80 MG tablet Take 1 tablet (80 mg total) by mouth daily. 30 tablet 1  . Cholecalciferol (VITAMIN D) 400 UNITS capsule Take 800 Units by mouth daily.    . cloNIDine (CATAPRES) 0.2 MG tablet Take 1 tablet (0.2 mg total) by mouth 2 (two) times daily. 60 tablet 1  . donepezil (ARICEPT) 5 MG tablet Take 1 tablet (5 mg total) by mouth at bedtime. 30 tablet 5  . lisinopril (PRINIVIL,ZESTRIL) 40 MG tablet Take 40 mg  by mouth daily.    . potassium chloride SA (K-DUR,KLOR-CON) 20 MEQ tablet Take 1 tablet (20 mEq total) by mouth daily. 90 tablet 3    Drug Regimen Review Drug regimen was reviewed and remains appropriate with no significant issues identified  Home: Home Living Family/patient expects to be discharged to:: Unsure Living Arrangements: Spouse/significant other Available Help at Discharge: Family, Available 24 hours/day Type of Home: House Home Access: Level entry Home Layout: One level Additional Comments: Unsure of accuracy of info given pt's expressive aphasia and cognitive deficits. Some information given different than previous admission.   Lives With: Daughter(recieved a phone call from boyfriend during eval)   Functional History: Prior Function Level of Independence: Independent Comments: Pt reports she was independent  Functional Status:  Mobility: Bed Mobility Overal bed mobility: Needs Assistance Bed Mobility: Supine to Sit Supine to sit: Min guard, HOB elevated Sit to supine: Min assist General bed mobility comments: HOB elevated. Extra time and effort. Coordination deficits noted Transfers Overall transfer level: Needs assistance Equipment used: Rolling walker (2 wheeled), 1 person hand held assist Transfers: Sit to/from Stand Sit to Stand: Min assist General transfer comment: Min A for lift assist and steadying assist. Verbal cues not to use R hand to press up  into standing.  Ambulation/Gait Ambulation/Gait assistance: Min assist Ambulation Distance (Feet): 10 Feet Assistive device: 1 person hand held assist Gait Pattern/deviations: Step-through pattern, Decreased stride length General Gait Details: Pt unsteady during gait with coordination deficits noted in RLE during RLE advancement. Required min A throughout for steadying during gait.  Gait velocity: Decreased  Gait velocity interpretation: Below normal speed for age/gender    ADL: ADL Overall ADL's : Needs  assistance/impaired Eating/Feeding: Set up, Sitting, Cueing for compensatory techinques Grooming: Wash/dry face, Oral care, Minimal assistance, Standing Upper Body Bathing: Minimal assistance, Sitting Lower Body Bathing: Minimal assistance, Sit to/from stand Lower Body Bathing Details (indicate cue type and reason): Pt requesting to wash LB during session, completing from sit<>stand level at sink with MinA for standing balance  Upper Body Dressing : Minimal assistance, Sitting Lower Body Dressing: Minimal assistance, Sit to/from stand Toilet Transfer: Minimal assistance, Ambulation, Regular Toilet Toileting- Clothing Manipulation and Hygiene: Minimal assistance, Sit to/from stand Toileting - Clothing Manipulation Details (indicate cue type and reason): min steadying assist during gown management and peri-care in standing after voiding bladder  Tub/ Shower Transfer: Minimal assistance, Ambulation, 3 in 1, Rolling walker Functional mobility during ADLs: Minimal assistance General ADL Comments: Pt completed toilet transfer, pericare, UB/LB bathing, and grooming tasks at sink; Pt mostly using LUE to complete tasks due to splint on RUE; Pt with word finding difficulties during session   Cognition: Cognition Overall Cognitive Status: No family/caregiver present to determine baseline cognitive functioning Arousal/Alertness: Awake/alert Orientation Level: Oriented to person, Oriented to place, Oriented to situation Attention: Selective Selective Attention: Appears intact Selective Attention Impairment: Verbal basic, Functional basic Memory: Appears intact Awareness: Appears intact Problem Solving: Appears intact Safety/Judgment: Appears intact Cognition Arousal/Alertness: Awake/alert Behavior During Therapy: WFL for tasks assessed/performed Overall Cognitive Status: No family/caregiver present to determine baseline cognitive functioning General Comments: Pt with previous stroke so unsure if  cognitive deficits are new. Pt reporting that she had not walked at all (PT note says she walked 15') and did not feel like she could walk to bathroom, ultimately was OOB and walking (with A) distance to/from bathroom).   Physical Exam: Blood pressure (!) 143/73, pulse 72, temperature 98.5 F (36.9 C), temperature source Oral, resp. rate 18, height '5\' 4"'$  (1.626 m), weight 43.1 kg (95 lb), SpO2 100 %. Physical Exam  Constitutional: She is oriented to person, place, and time. She appears well-developed.  Frail  HENT:  Head: Normocephalic and atraumatic.  Eyes: EOM are normal. Right eye exhibits no discharge. Left eye exhibits no discharge.  Neck: Normal range of motion. Neck supple.  Cardiovascular: Normal rate and regular rhythm.  Respiratory: Effort normal and breath sounds normal.  GI: Soft. Bowel sounds are normal.  Musculoskeletal:  No edema or tenderness in extremities Generalized atrophy  Neurological: She is alert and oriented to person, place, and time.  Moderate to severe dysarthria Expressive aphasia Motor: LUE/LLE: 5/5 proximal to distal RUE 4/5 (hand grip limited due to taping) RLE: 4/5 proximal to distal Sensation intact to light touch   Skin: Skin is warm and dry.  Psychiatric: She has a normal mood and affect. Her behavior is normal.   Results for orders placed or performed during the hospital encounter of 03/11/17 (from the past 48 hour(s))  Basic metabolic panel     Status: Abnormal   Collection Time: 03/13/17  4:28 AM  Result Value Ref Range   Sodium 137 135 - 145 mmol/L   Potassium 5.3 (H) 3.5 - 5.1  mmol/L    Comment: NO VISIBLE HEMOLYSIS   Chloride 107 101 - 111 mmol/L   CO2 22 22 - 32 mmol/L   Glucose, Bld 89 65 - 99 mg/dL   BUN 8 6 - 20 mg/dL   Creatinine, Ser 0.72 0.44 - 1.00 mg/dL   Calcium 8.9 8.9 - 10.3 mg/dL   GFR calc non Af Amer >60 >60 mL/min   GFR calc Af Amer >60 >60 mL/min    Comment: (NOTE) The eGFR has been calculated using the CKD EPI  equation. This calculation has not been validated in all clinical situations. eGFR's persistently <60 mL/min signify possible Chronic Kidney Disease.    Anion gap 8 5 - 15    Comment: Performed at [facility] [address], [place], [place] [address]  Magnesium     Status: None   Collection Time: 03/13/17  4:28 AM  Result Value Ref Range   Magnesium 1.9 1.7 - 2.4 mg/dL    Comment: Performed at [facility] [address], [place], [place] [address]   No results found.     Medical Problem List and Plan: 1.  Right side weakness/aphasia secondary to acute left MCA and history of PCA territory infarct July 2014 2.  DVT Prophylaxis/Anticoagulation: Eliquis 3. Pain Management: Tylenol as needed 4. Mood: Aricept 5 mg daily at bedtime 5. Neuropsych: This patient is capable of making decisions on her own behalf. 6. Skin/Wound Care: Routine skin checks 7. Fluids/Electrolytes/Nutrition: Routine I&O's with follow-up chemistries 8. Hypertension. Norvasc 10 mg daily, lisinopril 40 mg daily 9. Displaced fracture of the proximal phalanx on the right little finger after fall. Status post closed reduction and splinting. Nonweightbearing right hand 10. History of CAD with CABG. No chest pain or shortness of breath 11. Atrial fibrillation. Cardiac rate controlled. Continue Eliquis. Patient status post loop recorder 12. Hyperlipidemia. Lipitor 13. History of tobacco use. Counseling   Post Admission Physician Evaluation: 1. Preadmission assessment reviewed and changes made below. 2. Functional deficits secondary  to acute left MCA and history of PCA territory. 3. Patient is admitted to receive collaborative, interdisciplinary care between the physiatrist, rehab nursing staff, and therapy team. 4. Patient's level of medical complexity and substantial therapy needs in context of that medical necessity cannot be provided at a lesser intensity of care such as a SNF. 5. Patient has  experienced substantial functional loss from his/her baseline which was documented above under the "Functional History" and "Functional Status" headings.  Judging by the patient's diagnosis, physical exam, and functional history, the patient has potential for functional progress which will result in measurable gains while on inpatient rehab.  These gains will be of substantial and practical use upon discharge  in facilitating mobility and self-care at the household level. 58. Physiatrist will provide 24 hour management of medical needs as well as oversight of the therapy plan/treatment and provide guidance as appropriate regarding the interaction of the two. 7. 24 hour rehab nursing will assist with safety, disease management and patient education  and help integrate therapy concepts, techniques,education, etc. 8. PT will assess and treat for/with: Lower extremity strength, range of motion, stamina, balance, functional mobility, safety, adaptive techniques and equipment, coping skills, pain control, stroke education.   Goals are: Supervision/Mod I. 9. OT will assess and treat for/with: ADL's, functional mobility, safety, upper extremity strength, adaptive techniques and equipment, ego support, and community reintegration.   Goals are: Supervision/Mod I. Therapy may proceed with showering this patient. 10. SLP  will assess and treat for/with: speech, language.  Goals are: Supervision/Mod I. 11. Case Management and Social Worker will assess and treat for psychological issues and discharge planning. 12. Team conference will be held weekly to assess progress toward goals and to determine barriers to discharge. 13. Patient will receive at least 3 hours of therapy per day at least 5 days per week. 14. ELOS: 6-10 days.       15. Prognosis:  good  Delice Lesch, MD, ABPMR Lavon Paganini Angiulli, PA-C 03/14/2017

## 2017-03-14 NOTE — Care Management Note (Signed)
Case Management Note  [name]: [id number] Date of Birth: July 19, 1950  Subjective/Objective:                    Action/Plan: Pt discharging to CIR. CM consulted for PCP. CM can not arrange d/t not sure of timing of d/c from CIR. Pt will need PCP arranged prior to d/c from CIR. No further needs per CM.   Expected Discharge Date:  03/14/17               Expected Discharge Plan:  IP Rehab Facility  In-House Referral:  PCP / [facility]  Discharge planning Services  CM Consult, Other - See comment  Post Acute Care Choice:    Choice offered to:  Patient  DME Arranged:    DME Agency:     HH Arranged:    HH Agency:     Status of Service:  Completed, signed off  If discussed at [employer] of Stay Meetings, dates discussed:    Additional Comments:  Kermit Balo, RN 03/14/2017, 3:23 PM

## 2017-03-14 NOTE — Progress Notes (Signed)
Marcello Fennel, MD  Physician  Physical Medicine and Rehabilitation  Consult Note  Signed  Date of Service:  03/12/2017 12:16 PM       Related encounter: ED to Hosp-Admission (Current) from 03/11/2017 in [facility]      Signed      Expand All Collapse All      [] Hide copied text  [] Hover for details        Physical Medicine and Rehabilitation Consult Reason for Consult: Weakness Referring Physician:  Sallyanne Kuster, MD   HPI: [name] [name] is a 67 y.o. female  with past medical history of tobacco abuse, CVA with residual right-sided weakness (received CIR at that time), PAF, STEMI status post CABG, remote history of seizures (currently not on any medications), ?dementia (pt unsure why she was started on meds), hypertension presented on 03/10/17 with acute left MCA stroke. Per chart review, sister, and patient, patient was noted to have slurred speech and facial droop. Symptoms appear to have started on 2/6 when she felt right-sided weakness and had a fall. She presented to the ED on 2/8. X-rays of right hand were taken which showed fracture of right proximal fifth phalanx. CT head was obtained, reviewed, relatively unremarkable for acute intracranial process. MRI was done which showed left MCA and PCA territory infarct with patchy small acute ischemic nonhemorrhagic left MCA infarcts. Neurology was consult and stroke workup initiated. Patient with resultant exacerbation of right-sided weakness, cognitive deficits, balance impairments, expressive aphasia, and visual deficits. Hospital course further complicated by hypokalemia and acute blood loss anemia. Patient lives with boyfriend of 20 years, who works 12 hour shift.   Review of Systems  Eyes:       Right peripheral vision loss  Musculoskeletal: Positive for falls, joint pain and myalgias.  Neurological: Positive for dizziness, speech change and focal weakness.  All other systems reviewed and are  negative.      Past Medical History:  Diagnosis Date  . Acute ischemic left MCA stroke (HCC) 03/11/2017  . Hyperlipidemia LDL goal < 70 07/24/2012  . Hypertension   . 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 07/25/12 07/28/2012   POSTOPERATIVE DIAGNOSIS: Severe 3-vessel coronary disease, status post  myocardial infarction.  PROCEDURE: Median sternotomy, extracorporeal circulation, coronary  artery bypass grafting x5 (left internal mammary artery to LAD,  sequential saphenous vein graft to obtuse marginal 1, obtuse marginal 2,  and posterior descending with Y graft to 1st diagonal), endoscopic vein  harvest, left thigh.    . Seizures (HCC)    "years ago" (03/11/2017)  . STEMI (ST elevation myocardial infarction), secondary to disease of RCA, with disease of LAD and diag. branch and chronic non dominant LCX occlusion for CABG  07/21/2012  . Stroke [facility]) ?08/2012   "right sided weakness; speech isssues since" (03/11/2017)  . Tobacco abuse 07/21/2012        Past Surgical History:  Procedure Laterality Date  . CARDIAC CATHETERIZATION    . CESAREAN SECTION  X 1  . CORONARY ARTERY BYPASS GRAFT N/A 07/25/2012   Procedure: CORONARY ARTERY BYPASS GRAFTING (CABG) times five with Left  Endoscopic Saphenous Vein Harvest and Left Internal  Mammary ;  Surgeon: Loreli Slot, MD;  Location: [facility] OR;  Service: Open Heart Surgery;  Laterality: N/A;  . DILATION AND CURETTAGE OF UTERUS    . 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 LEFT HEART CATHETERIZATION WITH CORONARY ANGIOGRAM N/A 07/21/2012   Procedure: LEFT  HEART CATHETERIZATION WITH CORONARY ANGIOGRAM;  Surgeon: [name], MD;  Location: [facility] CATH LAB;  Service: Cardiovascular;  Laterality: N/A;  . LOOP RECORDER IMPLANT  08-15-2012   Medtronic LinQ implanted by Dr Johney Frame for cryptogenic stroke  . TEE WITHOUT CARDIOVERSION N/A 08/15/2012   Procedure: TRANSESOPHAGEAL ECHOCARDIOGRAM (TEE);  Surgeon: Wendall Stade, MD;   Location: [facility] ENDOSCOPY;  Service: Cardiovascular;  Laterality: N/A;  . TUBAL LIGATION          Family History  Problem Relation Age of Onset  . Hypertension Mother   . Stroke Brother        Died at age 70 secondary to MI  . CAD Brother    Social History:  reports that she has been smoking cigarettes.  She started smoking about 4 years ago. She has a 16.50 pack-year smoking history. she has never used smokeless tobacco. She reports that she does not drink alcohol or use drugs. Allergies: No Known Allergies       Medications Prior to Admission  Medication Sig Dispense Refill  . amLODipine (NORVASC) 10 MG tablet Take 10 mg by mouth daily.    Marland Kitchen apixaban (ELIQUIS) 5 MG TABS tablet Take 1 tablet (5 mg total) by mouth 2 (two) times daily. 60 tablet 3  . aspirin EC 325 MG tablet Take 325 mg by mouth daily.    Marland Kitchen atorvastatin (LIPITOR) 80 MG tablet Take 1 tablet (80 mg total) by mouth daily. 30 tablet 1  . Cholecalciferol (VITAMIN D) 400 UNITS capsule Take 800 Units by mouth daily.    . cloNIDine (CATAPRES) 0.2 MG tablet Take 1 tablet (0.2 mg total) by mouth 2 (two) times daily. 60 tablet 1  . donepezil (ARICEPT) 5 MG tablet Take 1 tablet (5 mg total) by mouth at bedtime. 30 tablet 5  . lisinopril (PRINIVIL,ZESTRIL) 40 MG tablet Take 40 mg by mouth daily.    . potassium chloride SA (K-DUR,KLOR-CON) 20 MEQ tablet Take 1 tablet (20 mEq total) by mouth daily. 90 tablet 3    Home: Home Living Family/patient expects to be discharged to:: Unsure Living Arrangements: Spouse/significant other Available Help at Discharge: Family, Available 24 hours/day Type of Home: House Home Access: Level entry Home Layout: One level Additional Comments: Unsure of accuracy of info given pt's expressive aphasia and cognitive deficits. Some information given different than previous admission.   Functional History: Prior Function Level of Independence: Independent Comments: Pt reports she was  independent Functional Status:  Mobility: Bed Mobility Overal bed mobility: Needs Assistance Bed Mobility: Supine to Sit, Sit to Supine Supine to sit: Min assist Sit to supine: Min assist General bed mobility comments: Min A for trunk elevation and scooting hips to EOB. Multiple verbal cues throughout to maintain NWB on R hand. Min A for LE assist for return back to bed.  Transfers Overall transfer level: Needs assistance Equipment used: 1 person hand held assist Transfers: Sit to/from Stand Sit to Stand: Min assist General transfer comment: Min A for lift assist and steadying assist. Verbal cues not to use R hand to press up into standing.  Ambulation/Gait Ambulation/Gait assistance: Min assist Ambulation Distance (Feet): 10 Feet Assistive device: 1 person hand held assist Gait Pattern/deviations: Step-through pattern, Decreased stride length General Gait Details: Pt unsteady during gait with coordination deficits noted in RLE during RLE advancement. Required min A throughout for steadying during gait.  Gait velocity: Decreased  Gait velocity interpretation: Below normal speed for age/gender  ADL:  Cognition: Cognition Overall  Cognitive Status: No family/caregiver present to determine baseline cognitive functioning Orientation Level: Oriented X4 Cognition Arousal/Alertness: Awake/alert Behavior During Therapy: WFL for tasks assessed/performed Overall Cognitive Status: No family/caregiver present to determine baseline cognitive functioning General Comments: Pt with previous stroke so unsure of cognitive deficits are new. Pt with slowed motor processing and required verbal and tactile cues to perform tasks.   Blood pressure (!) 107/51, pulse 71, temperature 98.9 F (37.2 C), temperature source Oral, resp. rate 18, height 5\' 4"  (1.626 m), weight 43.1 kg (95 lb), SpO2 100 %. Physical Exam  Constitutional: She is oriented to person, place, and time. She appears well-developed.    Frail  HENT:  Head: Normocephalic and atraumatic.  Eyes: EOM are normal. Right eye exhibits no discharge. Left eye exhibits no discharge.  Neck: Normal range of motion. Neck supple.  Cardiovascular: Normal rate and regular rhythm.  Respiratory: Effort normal and breath sounds normal.  GI: Soft. Bowel sounds are normal.  Musculoskeletal:  No edema or tenderness in extremities  Neurological: She is alert and oriented to person, place, and time.  Moderate to severe dysarthria Expressive aphasia Motor: LUE/LLE: 5/5 proximal to distal RUE/RLE: 4/5 (hand grip limited due to taping) Sensation intact to light touch  Skin: Skin is warm and dry.  Psychiatric: She has a normal mood and affect. Her behavior is normal.    LabResultsLast24Hours  Results for orders placed or performed during the hospital encounter of 03/11/17 (from the past 24 hour(s))  Urinalysis, Routine w reflex microscopic     Status: Abnormal   Collection Time: 03/12/17  3:04 AM  Result Value Ref Range   Color, Urine YELLOW YELLOW   APPearance HAZY (A) CLEAR   Specific Gravity, Urine 1.026 1.005 - 1.030   pH 5.0 5.0 - 8.0   Glucose, UA NEGATIVE NEGATIVE mg/dL   Hgb urine dipstick SMALL (A) NEGATIVE   Bilirubin Urine NEGATIVE NEGATIVE   Ketones, ur 20 (A) NEGATIVE mg/dL   Protein, ur NEGATIVE NEGATIVE mg/dL   Nitrite NEGATIVE NEGATIVE   Leukocytes, UA NEGATIVE NEGATIVE   RBC / HPF 0-5 0 - 5 RBC/hpf   WBC, UA 0-5 0 - 5 WBC/hpf   Bacteria, UA NONE SEEN NONE SEEN   Squamous Epithelial / LPF 0-5 (A) NONE SEEN      ImagingResults(Last48hours)  Ct Angio Head W Or Wo Contrast  Result Date: 03/11/2017 CLINICAL DATA:  Initial evaluation for acute speech difficulty, right hand swelling. EXAM: CT ANGIOGRAPHY HEAD AND NECK TECHNIQUE: Multidetector CT imaging of the head and neck was performed using the standard protocol during bolus administration of intravenous contrast. Multiplanar CT image  reconstructions and MIPs were obtained to evaluate the vascular anatomy. Carotid stenosis measurements (when applicable) are obtained utilizing NASCET criteria, using the distal internal carotid diameter as the denominator. CONTRAST:  50mL ISOVUE-370 IOPAMIDOL (ISOVUE-370) INJECTION 76% COMPARISON:  Prior CT from 03/10/2016 as well as MRI from earlier the same day. FINDINGS: CTA NECK FINDINGS Aortic arch: Visualized aortic arch of normal caliber with normal branch pattern. Moderate calcified plaque within the arch itself and about the origin of the great vessels without hemodynamically significant stenosis. Extensive atheromatous plaque throughout the visualized subclavian arteries without high-grade stenosis. Right carotid system: Multifocal plaque throughout the right common carotid artery without high-grade stenosis. A centric calcified plaque about the right carotid bifurcation and proximal right ICA. There is a focal short-segment greater than 80% stenosis at the proximal right ICA (series 7, image 179). Right ICA tortuous but otherwise  patent to the skull base without additional stenosis, dissection, or occlusion. Left carotid system: Multifocal plaque throughout the left common carotid artery without flow-limiting stenosis. Calcified atheromatous plaque about the proximal left ICA with associated relatively mild stenosis of 25-30%. Left ICA patent distally to the skull base without stenosis, dissection, or occlusion. Vertebral arteries: Both of the vertebral arteries arise from the subclavian arteries. Right vertebral artery occludes at its origin. Minimal scant distal reconstitution at the level of C2, likely via the muscular cutaneous branches (series 7, image 181). There is minimal thready flow distally into the skull base. Focal plaque at the origin of the left vertebral artery with severe ostial stenosis. Mild narrowing at the level of C4-5 and C5-6 due to extrinsic compression from uncovertebral  disease. Left vertebral artery otherwise widely patent. Skeleton: No acute osseous abnormality. No worrisome lytic or blastic osseous lesions. Moderate cervical spondylolysis at C4-5 through C6-7. Median sternotomy wires noted. Other neck: No acute soft tissue abnormality within the neck. Salivary glands normal. Thyroid normal. No adenopathy. Upper chest: Visualized upper chest demonstrates no acute abnormality. Centrilobular emphysema. Partially visualized lungs are clear. Review of the MIP images confirms the above findings CTA HEAD FINDINGS Anterior circulation: Petrous segments patent bilaterally. Calcified atheromatous plaque throughout the cavernous/supraclinoid segments bilaterally. Associated moderate to severe multifocal narrowing, right worse than left. ICA termini patent. A1 segments patent bilaterally. Right A1 segment slightly hypoplastic. Patent anterior communicating artery. Anterior cerebral arteries patent to their distal aspects without flow-limiting stenosis. M1 segments mildly irregular but patent without high-grade stenosis. No proximal M2 occlusion. Distal MCA branches well opacified and fairly symmetric. Extensive distal small vessel atheromatous irregularity. Posterior circulation: Dominant left vertebral artery widely patent to the vertebrobasilar junction without flow-limiting stenosis. Patent left PICA. Minimal scant flow within the right V4 segment, nearly occluded. Filling of the right PICA, likely via retrograde flow across the vertebrobasilar junction. Basilar artery patent proximally. Mild to moderate diffuse narrowing of the mid-distal basilar artery. Superior cerebral arteries patent bilaterally. Left PCA supplied via the basilar and is patent to its distal aspect. Hypoplastic right P1 with prominent right posterior communicating artery. Mild to moderate right P2 stenosis (series 8, image 97). Right PCA patent distally to its distal aspects. Venous sinuses: Patent. Anatomic  variants: None significant. No aneurysm or vascular abnormality. Delayed phase: No abnormal enhancement. Review of the MIP images confirms the above findings IMPRESSION: 1. Negative CTA for large vessel occlusion. 2. Severe greater than 80% short-segment proximal right ICA stenosis. 3. Extensive atherosclerotic change throughout the left carotid artery system without hemodynamically significant stenosis. Occlusion of the right vertebral artery at its origin, minimal scant thready distal reconstitution as above. 4. Severe stenosis at the origin of the left vertebral artery. Dominant left vertebral artery otherwise widely patent. 5. Extensive intracranial atherosclerosis as above, most notable within the carotid siphons and basilar artery. 6. Emphysema. Electronically Signed   By: Rise Mu M.D.   On: 03/11/2017 06:25   Dg Wrist Complete Right  Result Date: 03/10/2017 CLINICAL DATA:  RIGHT hand pain, stroke EXAM: RIGHT WRIST - COMPLETE 3+ VIEW COMPARISON:  None FINDINGS: Diffuse osseous demineralization. Joint spaces preserved. No acute fracture, dislocation, or bone destruction. IMPRESSION: No acute osseous abnormalities. Electronically Signed   By: Ulyses Southward M.D.   On: 03/10/2017 20:32   Ct Head Wo Contrast  Result Date: 03/10/2017 CLINICAL DATA:  Garbled speech and confusion EXAM: CT HEAD WITHOUT CONTRAST TECHNIQUE: Contiguous axial images were obtained from the base of  the skull through the vertex without intravenous contrast. COMPARISON:  MRI brain 08/14/2012, CT brain 08/13/2012 FINDINGS: Brain: No acute territorial infarction, or hemorrhage is visualized. Possible small 6 mm nodule or mass near the foramen of Monro. Encephalomalacia in the left parietal, posterior temporal and left occipital lobes consistent with old infarct. Mild atrophy. Ex vacuo dilatation of left lateral ventricle. Vascular: No hyperdense vessel.  Carotid vascular calcification. Skull: No fracture or suspicious lesion  Sinuses/Orbits: No acute finding. Other: None IMPRESSION: 1. No definite CT evidence for acute intracranial abnormality. Old left parietal, posterior temporal and occipital infarcts. 2. Suspected 6 mm isodense nodule near the foramen of Monro, new since 2014, possible colloid nodule, however given isodense appearance and new finding suggest further evaluation with nonemergent MRI. Electronically Signed   By: Jasmine Pang M.D.   On: 03/10/2017 20:08   Ct Angio Neck W Or Wo Contrast  Result Date: 03/11/2017 CLINICAL DATA:  Initial evaluation for acute speech difficulty, right hand swelling. EXAM: CT ANGIOGRAPHY HEAD AND NECK TECHNIQUE: Multidetector CT imaging of the head and neck was performed using the standard protocol during bolus administration of intravenous contrast. Multiplanar CT image reconstructions and MIPs were obtained to evaluate the vascular anatomy. Carotid stenosis measurements (when applicable) are obtained utilizing NASCET criteria, using the distal internal carotid diameter as the denominator. CONTRAST:  50mL ISOVUE-370 IOPAMIDOL (ISOVUE-370) INJECTION 76% COMPARISON:  Prior CT from 03/10/2016 as well as MRI from earlier the same day. FINDINGS: CTA NECK FINDINGS Aortic arch: Visualized aortic arch of normal caliber with normal branch pattern. Moderate calcified plaque within the arch itself and about the origin of the great vessels without hemodynamically significant stenosis. Extensive atheromatous plaque throughout the visualized subclavian arteries without high-grade stenosis. Right carotid system: Multifocal plaque throughout the right common carotid artery without high-grade stenosis. A centric calcified plaque about the right carotid bifurcation and proximal right ICA. There is a focal short-segment greater than 80% stenosis at the proximal right ICA (series 7, image 179). Right ICA tortuous but otherwise patent to the skull base without additional stenosis, dissection, or occlusion.  Left carotid system: Multifocal plaque throughout the left common carotid artery without flow-limiting stenosis. Calcified atheromatous plaque about the proximal left ICA with associated relatively mild stenosis of 25-30%. Left ICA patent distally to the skull base without stenosis, dissection, or occlusion. Vertebral arteries: Both of the vertebral arteries arise from the subclavian arteries. Right vertebral artery occludes at its origin. Minimal scant distal reconstitution at the level of C2, likely via the muscular cutaneous branches (series 7, image 181). There is minimal thready flow distally into the skull base. Focal plaque at the origin of the left vertebral artery with severe ostial stenosis. Mild narrowing at the level of C4-5 and C5-6 due to extrinsic compression from uncovertebral disease. Left vertebral artery otherwise widely patent. Skeleton: No acute osseous abnormality. No worrisome lytic or blastic osseous lesions. Moderate cervical spondylolysis at C4-5 through C6-7. Median sternotomy wires noted. Other neck: No acute soft tissue abnormality within the neck. Salivary glands normal. Thyroid normal. No adenopathy. Upper chest: Visualized upper chest demonstrates no acute abnormality. Centrilobular emphysema. Partially visualized lungs are clear. Review of the MIP images confirms the above findings CTA HEAD FINDINGS Anterior circulation: Petrous segments patent bilaterally. Calcified atheromatous plaque throughout the cavernous/supraclinoid segments bilaterally. Associated moderate to severe multifocal narrowing, right worse than left. ICA termini patent. A1 segments patent bilaterally. Right A1 segment slightly hypoplastic. Patent anterior communicating artery. Anterior cerebral arteries patent to their  distal aspects without flow-limiting stenosis. M1 segments mildly irregular but patent without high-grade stenosis. No proximal M2 occlusion. Distal MCA branches well opacified and fairly symmetric.  Extensive distal small vessel atheromatous irregularity. Posterior circulation: Dominant left vertebral artery widely patent to the vertebrobasilar junction without flow-limiting stenosis. Patent left PICA. Minimal scant flow within the right V4 segment, nearly occluded. Filling of the right PICA, likely via retrograde flow across the vertebrobasilar junction. Basilar artery patent proximally. Mild to moderate diffuse narrowing of the mid-distal basilar artery. Superior cerebral arteries patent bilaterally. Left PCA supplied via the basilar and is patent to its distal aspect. Hypoplastic right P1 with prominent right posterior communicating artery. Mild to moderate right P2 stenosis (series 8, image 97). Right PCA patent distally to its distal aspects. Venous sinuses: Patent. Anatomic variants: None significant. No aneurysm or vascular abnormality. Delayed phase: No abnormal enhancement. Review of the MIP images confirms the above findings IMPRESSION: 1. Negative CTA for large vessel occlusion. 2. Severe greater than 80% short-segment proximal right ICA stenosis. 3. Extensive atherosclerotic change throughout the left carotid artery system without hemodynamically significant stenosis. Occlusion of the right vertebral artery at its origin, minimal scant thready distal reconstitution as above. 4. Severe stenosis at the origin of the left vertebral artery. Dominant left vertebral artery otherwise widely patent. 5. Extensive intracranial atherosclerosis as above, most notable within the carotid siphons and basilar artery. 6. Emphysema. Electronically Signed   By: Rise Mu M.D.   On: 03/11/2017 06:25   Mr Brain Wo Contrast  Result Date: 03/11/2017 CLINICAL DATA:  Initial evaluation for acute speech difficulty, right hand abnormality. EXAM: MRI HEAD WITHOUT CONTRAST TECHNIQUE: Multiplanar, multiecho pulse sequences of the brain and surrounding structures were obtained without intravenous contrast.  COMPARISON:  Prior CT from 03/10/2017. FINDINGS: Brain: Diffuse prominence of the CSF containing spaces compatible with generalized cerebral atrophy. Minimal chronic microvascular ischemic changes for age. Encephalomalacia with gliosis within the left temporal occipital region consistent with remote left PCA territory infarct. Associated lacunar infarct within the left thalamus. Additional moderate-sized remote left posterior MCA territory infarct. Associated volume loss with ex vacuo dilatation of the left lateral ventricle. There are patchy multifocal small volume acute ischemic predominantly cortical infarcts involving the left frontoparietal region (series 3, images 29-39). No significant mass effect. No associated hemorrhage. No other evidence for acute or subacute ischemia. No mass lesion, midline shift or mass effect. No hydrocephalus. No extra-axial fluid collection. Major dural sinuses are grossly patent. Pituitary gland suprasellar region normal. Midline structures intact and normal. Previously question nodular density near the foramen of Monro felt to be related to patient positioning. Vascular: Major intravascular flow voids are maintained at the skull base. Skull and upper cervical spine: Craniocervical junction normal. Upper cervical spine within normal limits. Bone marrow signal intensity within normal limits. No scalp soft tissue abnormality. Sinuses/Orbits: Globes and orbital soft tissues within normal limits. Paranasal sinuses are largely clear. No significant mastoid effusion. Inner ear structures grossly normal. Other: None. IMPRESSION: 1. Patchy small volume acute ischemic nonhemorrhagic left MCA territory cortical infarcts involving the left frontal and parietal lobes as above. 2. Moderate to large-sized remote left posterior MCA and PCA territory infarcts. 3. Age-related cerebral atrophy. Electronically Signed   By: Rise Mu M.D.   On: 03/11/2017 01:35   Dg Hand Complete  Right  Result Date: 03/10/2017 CLINICAL DATA:  Hand pain, history of stroke EXAM: RIGHT HAND - COMPLETE 3+ VIEW COMPARISON:  None FINDINGS: Diffuse osseous demineralization. Transverse fracture  at base of proximal phalanx RIGHT little finger with displacement, angulation, and deformity. Joint spaces preserved. No additional fracture, dislocation, or bone destruction. Dorsal soft tissue swelling overlying the MCP joints. IMPRESSION: Displaced and angulated fracture at base of proximal phalanx RIGHT little finger. Electronically Signed   By: Ulyses Southward M.D.   On: 03/10/2017 20:31   Dg Finger Little Right  Result Date: 03/11/2017 CLINICAL DATA:  Status post reduction of right fifth proximal phalanx fracture. Initial encounter. EXAM: RIGHT LITTLE FINGER 2+V COMPARISON:  None. FINDINGS: There is mildly improved alignment of the fracture through the base of the fifth proximal phalanx, with residual radial and volar displacement. Surrounding soft tissue swelling is noted. No new fractures are seen. IMPRESSION: Mildly improved alignment of the fracture through the base of the fifth proximal phalanx, with residual radial and volar displacement. Electronically Signed   By: Roanna Raider M.D.   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  Records reviewed and summated above. Stroke: Continue secondary stroke prophylaxis and Risk Factor Modification listed below:   Antiplatelet therapy:   Blood Pressure Management:  Continue current medication with prn's with permisive HTN per primary team Statin Agent:   Tobacco abuse:   Motor recovery: Fluoxetine  1. Does the need for close, 24 hr/day medical supervision in concert with the patient's rehab needs make it unreasonable for this patient to be served in a less intensive setting? Yes  2. Co-Morbidities requiring supervision/potential complications: right proximal  fifth phalanx (consider bracing), acute blood loss anemia (transfuse if necessary to ensure appropriate perfusion for increased activity tolerance), hypokalemia (continue to monitor, supplement as necessary), tobacco abuse (counsel), CVA with residual right-sided weakness, PAF (continue meds, monitor with increased mobility), STEMI status post CABG, remote history of seizures (consider meds if necessary), HTN (monitor and provide prns in accordance with increased physical exertion and pain), ?dementia (continue meds for now) 3. Due to safety, disease management and patient education, does the patient require 24 hr/day rehab nursing? Yes 4. Does the patient require coordinated care of a physician, rehab nurse, PT (1-2 hrs/day, 5 days/week), OT (1-2 hrs/day, 5 days/week) and SLP (1-2 hrs/day, 5 days/week) to address physical and functional deficits in the context of the above medical diagnosis(es)? Yes Addressing deficits in the following areas: balance, endurance, locomotion, strength, transferring, bathing, dressing, toileting, cognition, speech and psychosocial support 5. Can the patient actively participate in an intensive therapy program of at least 3 hrs of therapy per day at least 5 days per week? Yes 6. The potential for patient to make measurable gains while on inpatient rehab is excellent 7. Anticipated functional outcomes upon discharge from inpatient rehab are modified independent and supervision  with PT, modified independent and supervision with OT, modified independent and supervision with SLP. 8. Estimated rehab length of stay to reach the above functional goals is: 14-18 days. 9. Anticipated D/C setting: Home 10. Anticipated post D/C treatments: HH therapy and Home excercise program 11. Overall Rehab/Functional Prognosis: good  RECOMMENDATIONS: This patient's condition is appropriate for continued rehabilitative care in the following setting: CIR after completion of medical  workup. Patient has agreed to participate in recommended program. Yes Note that insurance prior authorization may be required for reimbursement for recommended care.  Comment: Rehab Admissions Coordinator to follow up.  Maryla Morrow, MD, ABPMR 03/12/2017           Routing History

## 2017-03-14 NOTE — PMR Pre-admission (Signed)
PMR Admission Coordinator Pre-Admission Assessment  Patient: [name] [name] is an 67 y.o., female MRN: [id number] DOB: 1951-01-11 Height: 5\' 4"  (162.6 cm) Weight: 43.1 kg (95 lb)              Insurance Information HMO:     PPO:     PCP:      IPA:      80/20:      OTHER: medicare advantage plan PRIMARY: [employer]      Policy#: [id number]      Subscriber: pt CM Name: Gweneth Dimitri      Phone#: [phone]     Fax#: [phone] Pre-Cert#: [id number] for 7 days with f/u Rebeca Alert phone [phone] fax [phone]      Employer: disabled Benefits:  Phone #: [phone]     Name: 03/14/2017 Eff. Date: 02/01/2017     Deduct: none      Out of Pocket Max: [phone]      Life Max: none CIR: $500 co pay per admission with Medicaid      SNF: no co pay per day days 1 to 20; $170.50 co pay per day days 21 to 100 Outpatient: no co pay per visit     Co-Pay: visits per medical neccesity Home Health: 100%      Co-Pay: visits per medical neccesity DME: 80%     Co-Pay: 20% Providers: in network  SECONDARY: Medicaid      Policy#: TBD policy # to be verified with pt's sister, Drinda Butts      Subscriber: pt  Medicaid Application Date:       Case Manager:  Disability Application Date:       Case Worker:   Emergency Conservator, museum/gallery Information    Name Relation Home Work Mobile   [place] Sister   [phone]   Bynum,Lillie Mother [phone]     Ardyth Harps Significant other   [phone]   [place]ord [place] Relative [phone]       Current Medical History  Patient Admitting Diagnosis: left MCA and PCA territory infarcts  History of Present Illness:  HPI: [name] [name] a 67 y.o. right handed femalewith past medical history of tobacco abuse, CVA July 2014 with residual right-sided weakness status post loop recorder placement maintained on Eliquis as well as aspirin (received CIR at that time),PAF,STEMI status post CABG,remote history ofseizures(currently not on  any medications),?dementia(pt unsure why she was started on meds),hypertension. Patient lives with her boyfriend of 20 years who works 12 hour shifts. Patient reported to be independent prior to admission.  Presented on 2/7/19with acute left MCA stroke. Per chart review, sister, and patient,patient was noted to have slurred speech and facial droop. Symptoms appear to have started on 2/6 when she felt right-sided weakness and had a fall. She presented to the ED on 2/8. X-rays of right hand were taken which showed fracture of right proximal fifth phalanx. CT head was obtained, reviewed, relatively unremarkable for acute intracranial process. MRI was done which showed left MCA and PCA territory infarct with patchy small acute ischemic nonhemorrhagic left MCA infarcts. Patient did not receive TPA. Neurology was consult and stroke workup initiated. Echocardiogram with ejection fraction of 60% no wall motion abnormalities. Patient with resultant exacerbation of right-sided weakness, cognitive deficits, balance impairments, expressive aphasia, and visual deficits. Hospital coursefurthercomplicated by hypokalemia and acute blood loss anemia. Tolerating a regular diet. Underwent closed reduction and splinting for displaced fracture of the proximal phalanx on the right little finger and nonweightbearing on right hand.  Total: 6 NIHSS    Past Medical History  Past Medical History:  Diagnosis Date  . Acute ischemic left MCA stroke (HCC) 03/11/2017  . Hyperlipidemia LDL goal < 70 07/24/2012  . Hypertension   . 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 07/25/12 07/28/2012   POSTOPERATIVE DIAGNOSIS: Severe 3-vessel coronary disease, status post  myocardial infarction.  PROCEDURE: Median sternotomy, extracorporeal circulation, coronary  artery bypass grafting x5 (left internal mammary artery to LAD,  sequential saphenous vein graft to obtuse marginal 1, obtuse marginal 2,  and posterior descending  with Y graft to 1st diagonal), endoscopic vein  harvest, left thigh.    . Seizures (HCC)    "years ago" (03/11/2017)  . STEMI (ST elevation myocardial infarction), secondary to disease of RCA, with disease of LAD and diag. branch and chronic non dominant LCX occlusion for CABG  07/21/2012  . Stroke [facility]) ?08/2012   "right sided weakness; speech isssues since" (03/11/2017)  . Tobacco abuse 07/21/2012    Family History  family history includes CAD in her brother; Hypertension in her mother; Stroke in her brother.  Prior Rehab/Hospitalizations:  Has the patient had major surgery during 100 days prior to admission? No  CIR 2014  Current Medications   Current Facility-Administered Medications:  .  acetaminophen (TYLENOL) tablet 650 mg, 650 mg, Oral, Q4H PRN **OR** acetaminophen (TYLENOL) solution 650 mg, 650 mg, Per Tube, Q4H PRN **OR** acetaminophen (TYLENOL) suppository 650 mg, 650 mg, Rectal, Q4H PRN, Opyd, Timothy S, MD .  amLODipine (NORVASC) tablet 10 mg, 10 mg, Oral, Daily, Randel Pigg, Dorma Russell, MD, 10 mg at 03/14/17 6606 .  apixaban (ELIQUIS) tablet 5 mg, 5 mg, Oral, BID, Randel Pigg, Dorma Russell, MD, 5 mg at 03/14/17 0851 .  atorvastatin (LIPITOR) tablet 10 mg, 10 mg, Oral, q1800, Costello, Mary A, NP, 10 mg at 03/13/17 1653 .  cholecalciferol (VITAMIN D) tablet 1,000 Units, 1,000 Units, Oral, Daily, Opyd, Lavone Neri, MD, 1,000 Units at 03/14/17 [phone] .  donepezil (ARICEPT) tablet 5 mg, 5 mg, Oral, QHS, Opyd, Lavone Neri, MD, 5 mg at 03/13/17 2137 .  lisinopril (PRINIVIL,ZESTRIL) tablet 40 mg, 40 mg, Oral, Daily, Randel Pigg, Dorma Russell, MD, 40 mg at 03/14/17 0851 .  senna-docusate (Senokot-S) tablet 1 tablet, 1 tablet, Oral, QHS PRN, Opyd, Lavone Neri, MD  Patients Current Diet: Fall precautions Diet regular Room service appropriate? Yes; Fluid consistency: Thin Diet - low sodium heart healthy  Precautions / Restrictions Precautions Precautions: Fall Precaution Comments: expressive  aphasis Restrictions Weight Bearing Restrictions: Yes RUE Weight Bearing: Non weight bearing Other Position/Activity Restrictions: R pinky in splint   Has the patient had 2 or more falls or a fall with injury in the past year?No  Prior Activity Level Community (5-7x/wk): Independent pta; does not drive  Journalist, newspaper / Equipment Home Assistive Devices/Equipment: Grab bars in shower, Grab bars around toilet, Hand-held shower hose  Prior Device Use: Indicate devices/aids used by the patient prior to current illness, exacerbation or injury? None of the above  Prior Functional Level Prior Function Level of Independence: Independent Comments: Pt reports she was independent  Self Care: Did the patient need help bathing, dressing, using the toilet or eating?  Independent  Indoor Mobility: Did the patient need assistance with walking from room to room (with or without device)? Independent  Stairs: Did the patient need assistance with internal or external stairs (with or without device)? Independent  Functional Cognition: Did the patient need help planning regular tasks such as shopping  or remembering to take medications? Needed some help  Current Functional Level Cognition  Arousal/Alertness: Awake/alert Overall Cognitive Status: Impaired/Different from baseline(unsure of baseline level of cognition) Orientation Level: Oriented to person, Oriented to place, Oriented to situation, Disoriented to time Following Commands: Follows one step commands with increased time, Follows multi-step commands inconsistently Safety/Judgement: Decreased awareness of safety, Decreased awareness of deficits(R sided inattention) General Comments: pt with significant word finding difficulties, pt with noted R sided inattention as patient only turns to the L despite v/c's to turn R. Pt difficulty sequencing multi step commands ie. turn around and stop at the diamond, do a figure 8 around these  cones Attention: Selective Selective Attention: Appears intact Selective Attention Impairment: Verbal basic, Functional basic Memory: Appears intact Awareness: Appears intact Problem Solving: Appears intact Safety/Judgment: Appears intact    Extremity Assessment (includes Sensation/Coordination)  Upper Extremity Assessment: Generalized weakness, RUE deficits/detail RUE Deficits / Details: 4th and 5th digits and medial palm in bandage. grossly 4-/5. Imparied coordination, tends to use whole arm movements vs. isolated.  RUE Coordination: decreased fine motor, decreased gross motor  Lower Extremity Assessment: Defer to PT evaluation RLE Deficits / Details: Grossly 4-/5 throughout. Noted Coordination difficulties with RLE during gait.  RLE Coordination: decreased gross motor, decreased fine motor    ADLs  Overall ADL's : Needs assistance/impaired Eating/Feeding: Set up, Sitting, Cueing for compensatory techinques Grooming: Wash/dry face, Oral care, Minimal assistance, Standing Upper Body Bathing: Minimal assistance, Sitting Lower Body Bathing: Minimal assistance, Sit to/from stand Lower Body Bathing Details (indicate cue type and reason): Pt requesting to wash LB during session, completing from sit<>stand level at sink with MinA for standing balance  Upper Body Dressing : Minimal assistance, Sitting Lower Body Dressing: Minimal assistance, Sit to/from stand Toilet Transfer: Minimal assistance, Ambulation, Regular Toilet Toileting- Clothing Manipulation and Hygiene: Minimal assistance, Sit to/from stand Toileting - Clothing Manipulation Details (indicate cue type and reason): min steadying assist during gown management and peri-care in standing after voiding bladder  Tub/ Shower Transfer: Minimal assistance, Ambulation, 3 in 1, Rolling walker Functional mobility during ADLs: Minimal assistance General ADL Comments: Pt completed toilet transfer, pericare, UB/LB bathing, and grooming tasks  at sink; Pt mostly using LUE to complete tasks due to splint on RUE; Pt with word finding difficulties during session     Mobility  Overal bed mobility: Needs Assistance Bed Mobility: Supine to Sit Supine to sit: Min guard, HOB elevated Sit to supine: Min assist General bed mobility comments: OOB in recliner upon arrival    Transfers  Overall transfer level: Needs assistance Equipment used: 1 person hand held assist Transfers: Sit to/from Stand Sit to Stand: Min assist General transfer comment: minA to power up, v/c's to not use R UE to push up    Ambulation / Gait / Stairs / Wheelchair Mobility  Ambulation/Gait Ambulation/Gait assistance: Architect (Feet): 250 Feet Assistive device: 1 person hand held assist Gait Pattern/deviations: Step-through pattern, Decreased stride length General Gait Details: pt unsteady with shuffle like gait pattern, pt requires max directional v/c's due to poor navigational skills Gait velocity: slow Gait velocity interpretation: Below normal speed for age/gender Stairs: Yes Stairs assistance: Min assist Stair Management: One rail Left, Forwards, Step to pattern Number of Stairs: 10 General stair comments: pt with difficulty clearing R foot on stairs. v/c's to complete step to versus alternating step pattern to minimize falls risk    Posture / Balance Balance Overall balance assessment: Needs assistance Sitting-balance support: Feet supported,  Single extremity supported Sitting balance-Leahy Scale: Fair Standing balance support: During functional activity, Single extremity supported Standing balance-Leahy Scale: Poor Standing balance comment: pt requires physical assist to maintain balance High level balance activites: Backward walking High Level Balance Comments: Very unsteady when performing backwards walking. Required cues for sequencing.  Standardized Balance Assessment Standardized Balance Assessment : Dynamic Gait  Index Dynamic Gait Index Level Surface: Moderate Impairment Change in Gait Speed: Moderate Impairment Gait with Horizontal Head Turns: Mild Impairment Gait with Vertical Head Turns: Mild Impairment Gait and Pivot Turn: Moderate Impairment Step Over Obstacle: Mild Impairment Step Around Obstacles: Mild Impairment Steps: Moderate Impairment Total Score: 12    Special needs/care consideration BiPAP/CPAP  N/a CPM  N/a Continuous Drip IV  N/a Dialysis  N/a Life Vest  N/a Oxygen  N/a Special Bed  N/a Trach Size  N/a Wound Vac n/a Skin intact Bowel mgmt: continent LBM 03/12/17 Bladder mgmt: continent Diabetic mgmt  N/a   Previous Home Environment Living Arrangements: Spouse/significant other(boyfriend of 20 years)  Lives With: Significant other Available Help at Discharge: (boyfriend states his stepdaughter, [place], may stay with her) Type of Home: Apartment Home Layout: One level Home Access: Level entry Bathroom Shower/Tub: Tub/shower unit, Engineer, building services: Standard Bathroom Accessibility: Yes How Accessible: Accessible via walker Home Care Services: No Additional Comments: boyfriend confirmed   Discharge Living Setting Plans for Discharge Living Setting: Lives with (comment), Apartment(with boyfriend, Rodrigo Ran) Type of Home at Discharge: Apartment Discharge Home Layout: One level Discharge Home Access: Level entry Discharge Bathroom Shower/Tub: Tub/shower unit, Curtain Discharge Bathroom Toilet: Standard Discharge Bathroom Accessibility: Yes How Accessible: Accessible via walker Does the patient have any problems obtaining your medications?: No  Social/Family/Support Systems Patient Roles: Partner Contact Information: Molly Maduro, boyfriend and sister, Drinda Butts Anticipated Caregiver: Molly Maduro and his stepdaughter, Liborio Nixon Anticipated Industrial/product designer Information: see above Ability/Limitations of Caregiver: boyfriend works 12 hr shifts and rides bike to  work Medical laboratory scientific officer: (boyfriend and stepdaughter to cover near 24/7 per Molly Maduro) Discharge Plan Discussed with Primary Caregiver: Yes Is Caregiver In Agreement with Plan?: Yes Does Caregiver/Family have Issues with Lodging/Transportation while Pt is in Rehab?: No  Goals/Additional Needs Patient/Family Goal for Rehab: Mod I to supervision with PT, OT, and SLP Expected length of stay: ELOS 7 to 10 days Pt/Family Agrees to Admission and willing to participate: Yes Program Orientation Provided & Reviewed with Pt/Caregiver Including Roles  & Responsibilities: Yes  Decrease burden of Care through IP rehab admission: n/a  Possible need for SNF placement upon discharge:not anticipated  Patient Condition: This patient's condition remains as documented in the consult dated 03/12/2017, in which the Rehabilitation Physician determined and documented that the patient's condition is appropriate for intensive rehabilitative care in an inpatient rehabilitation facility. Will admit to inpatient rehab today.  Preadmission Screen Completed By:  Clois Dupes, 03/14/2017 3:26 PM ______________________________________________________________________   Discussed status with Dr. Allena Katz on 03/14/2017 at  1536 and received telephone approval for admission today.  Admission Coordinator:  Clois Dupes, time 1610 Date 03/14/2017

## 2017-03-14 NOTE — Plan of Care (Signed)
  Progressing Education: Knowledge of disease or condition will improve 03/14/2017 1052 - Progressing by Quentin Cornwall, RN Knowledge of secondary prevention will improve 03/14/2017 1052 - Progressing by Quentin Cornwall, RN Knowledge of patient specific risk factors addressed and post discharge goals established will improve 03/14/2017 1052 - Progressing by Quentin Cornwall, RN Coping: Will verbalize positive feelings about self 03/14/2017 1052 - Progressing by Quentin Cornwall, RN Will identify appropriate support needs 03/14/2017 1052 - Progressing by Quentin Cornwall, RN Health Behavior/Discharge Planning: Ability to manage health-related needs will improve 03/14/2017 1052 - Progressing by Quentin Cornwall, RN Self-Care: Ability to participate in self-care as condition permits will improve 03/14/2017 1052 - Progressing by Quentin Cornwall, RN Verbalization of feelings and concerns over difficulty with self-care will improve 03/14/2017 1052 - Progressing by Quentin Cornwall, RN Ability to communicate needs accurately will improve 03/14/2017 1052 - Progressing by Quentin Cornwall, RN Nutrition: Risk of aspiration will decrease 03/14/2017 1052 - Progressing by Quentin Cornwall, RN Dietary intake will improve 03/14/2017 1052 - Progressing by Quentin Cornwall, RN Ischemic Stroke/TIA Tissue Perfusion: Complications of ischemic stroke/TIA will be minimized 03/14/2017 1052 - Progressing by Quentin Cornwall, RN Education: Knowledge of General Education information will improve 03/14/2017 1052 - Progressing by Quentin Cornwall, RN

## 2017-03-14 NOTE — Progress Notes (Signed)
I met with pt at bedside to discuss goals and expectations of an inpt rehab admit. Pt previously at [facility] 2014. She is in agreement. I will begin insurance approval for possible CIR today. [phone]

## 2017-03-14 NOTE — Progress Notes (Signed)
Physical Therapy Treatment Patient Details Name: [name] [name] MRN: [id number] DOB: [birth year] Today's Date: 03/14/2017    History of Present Illness Pt is a 67 y/o female admitted secondary to R weakness and fall. 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 Imaging revealed displaced fracture of the proximal phalanx on R little finger. Pt is s/p closed reduction and splinting. PMH includes CVA, CAD s/p CABG, HTN, a fib, and tobacco abuse.     PT Comments    Pt presenting with word finding difficulty, delayed response time, impaired co-ordination, impaired sequencing, impaired multi-step command follow, and noted R sided inattention. Pt motivated and has good home set up and support. Pt to highly benefit from CIR upon d/c to maximize functional return for safe transition home with friend. Acute PT to con't to follow.   Follow Up Recommendations  CIR;Supervision/Assistance - 24 hour     Equipment Recommendations  None recommended by PT    Recommendations for Other Services Rehab consult     Precautions / Restrictions Precautions Precautions: Fall Precaution Comments: expressive aphasis Restrictions Weight Bearing Restrictions: Yes RUE Weight Bearing: Non weight bearing Other Position/Activity Restrictions: R pinky in splint    Mobility  Bed Mobility               General bed mobility comments: OOB in recliner upon arrival  Transfers Overall transfer level: Needs assistance Equipment used: 1 person hand held assist Transfers: Sit to/from Stand Sit to Stand: Min assist         General transfer comment: minA to power up, v/c's to not use R UE to push up  Ambulation/Gait Ambulation/Gait assistance: Min assist Ambulation Distance (Feet): 250 Feet Assistive device: 1 person hand held assist Gait Pattern/deviations: Step-through pattern;Decreased stride length Gait velocity: slow Gait  velocity interpretation: Below normal speed for age/gender General Gait Details: pt unsteady with shuffle like gait pattern, pt requires max directional v/c's due to poor navigational skills   Stairs Stairs: Yes   Stair Management: One rail Left;Forwards;Step to pattern Number of Stairs: 10 General stair comments: pt with difficulty clearing R foot on stairs. v/c's to complete step to versus alternating step pattern to minimize falls risk  Wheelchair Mobility    Modified Rankin (Stroke Patients Only) Modified Rankin (Stroke Patients Only) Pre-Morbid Rankin Score: Slight disability Modified Rankin: Moderately severe disability     Balance Overall balance assessment: Needs assistance Sitting-balance support: Feet supported;Single extremity supported Sitting balance-Leahy Scale: Fair     Standing balance support: During functional activity;Single extremity supported Standing balance-Leahy Scale: Poor Standing balance comment: pt requires physical assist to maintain balance                 Standardized Balance Assessment Standardized Balance Assessment : Dynamic Gait Index   Dynamic Gait Index Level Surface: Moderate Impairment Change in Gait Speed: Moderate Impairment Gait with Horizontal Head Turns: Mild Impairment Gait with Vertical Head Turns: Mild Impairment Gait and Pivot Turn: Moderate Impairment Step Over Obstacle: Mild Impairment Step Around Obstacles: Mild Impairment Steps: Moderate Impairment Total Score: 12      Cognition Arousal/Alertness: Awake/alert Behavior During Therapy: WFL for tasks assessed/performed Overall Cognitive Status: Impaired/Different from baseline(unsure of baseline level of cognition) Area of Impairment: Following commands;Problem solving;Safety/judgement;Memory                     Memory: Decreased short-term memory(only able to recall 1/3 objects  on mini mental exam) Following Commands: Follows one step commands with  increased time;Follows multi-step commands inconsistently Safety/Judgement: Decreased awareness of safety;Decreased awareness of deficits(R sided inattention)   Problem Solving: Slow processing;Difficulty sequencing;Requires verbal cues;Requires tactile cues General Comments: pt with significant word finding difficulties, pt with noted R sided inattention as patient only turns to the L despite v/c's to turn R. Pt difficulty sequencing multi step commands ie. turn around and stop at the diamond, do a figure 8 around these cones      Exercises      General Comments        Pertinent Vitals/Pain Pain Assessment: No/denies pain    Home Living                      Prior Function            PT Goals (current goals can now be found in the care plan section) Acute Rehab PT Goals Patient Stated Goal: CIR then home Progress towards PT goals: Progressing toward goals    Frequency    Min 4X/week      PT Plan Current plan remains appropriate    Co-evaluation              AM-PAC PT "6 Clicks" Daily Activity  Outcome Measure  Difficulty turning over in bed (including adjusting bedclothes, sheets and blankets)?: A Little Difficulty moving from lying on back to sitting on the side of the bed? : A Little Difficulty sitting down on and standing up from a chair with arms (e.g., wheelchair, bedside commode, etc,.)?: A Little Help needed moving to and from a bed to chair (including a wheelchair)?: A Little Help needed walking in hospital room?: A Little Help needed climbing 3-5 steps with a railing? : A Little 6 Click Score: 18    End of Session Equipment Utilized During Treatment: Gait belt Activity Tolerance: Patient tolerated treatment well Patient left: in chair;with call bell/phone within reach;with chair alarm set Nurse Communication: Mobility status PT Visit Diagnosis: Unsteadiness on feet (R26.81);Other symptoms and signs involving the nervous system  (R29.898);Muscle weakness (generalized) (M62.81);History of falling (Z91.81)     Time: 1610-9604 PT Time Calculation (min) (ACUTE ONLY): 19 min  Charges:  $Gait Training: 8-22 mins                    G Codes:       [name], PT, DPT Pager #: [phone] Office #: [phone]    [name] [name] 03/14/2017, 11:23 AM

## 2017-03-15 ENCOUNTER — Inpatient Hospital Stay (HOSPITAL_COMMUNITY): Payer: Medicare Other

## 2017-03-15 ENCOUNTER — Inpatient Hospital Stay (HOSPITAL_COMMUNITY): Payer: Medicare Other | Admitting: Physical Therapy

## 2017-03-15 ENCOUNTER — Inpatient Hospital Stay (HOSPITAL_COMMUNITY): Payer: Medicare Other | Admitting: Occupational Therapy

## 2017-03-15 DIAGNOSIS — H53461 Homonymous bilateral field defects, right side: Secondary | ICD-10-CM

## 2017-03-15 DIAGNOSIS — I63512 Cerebral infarction due to unspecified occlusion or stenosis of left middle cerebral artery: Secondary | ICD-10-CM

## 2017-03-15 DIAGNOSIS — I69351 Hemiplegia and hemiparesis following cerebral infarction affecting right dominant side: Principal | ICD-10-CM

## 2017-03-15 LAB — CBC WITH DIFFERENTIAL/PLATELET
BASOS PCT: 1 %
Basophils Absolute: 0.1 10*3/uL (ref 0.0–0.1)
EOS PCT: 3 %
Eosinophils Absolute: 0.2 10*3/uL (ref 0.0–0.7)
HEMATOCRIT: 37.7 % (ref 36.0–46.0)
Hemoglobin: 12.5 g/dL (ref 12.0–15.0)
Lymphocytes Relative: 43 %
Lymphs Abs: 2.3 10*3/uL (ref 0.7–4.0)
MCH: 31 pg (ref 26.0–34.0)
MCHC: 33.2 g/dL (ref 30.0–36.0)
MCV: 93.5 fL (ref 78.0–100.0)
MONO ABS: 0.6 10*3/uL (ref 0.1–1.0)
Monocytes Relative: 11 %
NEUTROS ABS: 2.3 10*3/uL (ref 1.7–7.7)
Neutrophils Relative %: 42 %
PLATELETS: 245 10*3/uL (ref 150–400)
RBC: 4.03 MIL/uL (ref 3.87–5.11)
RDW: 13.7 % (ref 11.5–15.5)
WBC: 5.4 10*3/uL (ref 4.0–10.5)

## 2017-03-15 LAB — COMPREHENSIVE METABOLIC PANEL
ALBUMIN: 3.3 g/dL — AB (ref 3.5–5.0)
ALT: 19 U/L (ref 14–54)
AST: 27 U/L (ref 15–41)
Alkaline Phosphatase: 57 U/L (ref 38–126)
Anion gap: 11 (ref 5–15)
BUN: 12 mg/dL (ref 6–20)
CHLORIDE: 102 mmol/L (ref 101–111)
CO2: 25 mmol/L (ref 22–32)
Calcium: 9.4 mg/dL (ref 8.9–10.3)
Creatinine, Ser: 0.89 mg/dL (ref 0.44–1.00)
GFR calc Af Amer: >60 mL/min (ref >60)
GFR calc non Af Amer: >60 mL/min (ref >60)
GLUCOSE: 92 mg/dL (ref 65–99)
POTASSIUM: 4.7 mmol/L (ref 3.5–5.1)
Sodium: 138 mmol/L (ref 135–145)
Total Bilirubin: 0.5 mg/dL (ref 0.3–1.2)
Total Protein: 6.2 g/dL — ABNORMAL LOW (ref 6.5–8.1)

## 2017-03-15 NOTE — Care Management Note (Signed)
[facility] Individual Statement of Services  Patient Name:  [name] [name]  Date:  03/15/2017  Welcome to the [facility].  Our goal is to provide you with an individualized program based on your diagnosis and situation, designed to meet your specific needs.  With this comprehensive rehabilitation program, you will be expected to participate in at least 3 hours of rehabilitation therapies Monday-Friday, with modified therapy programming on the weekends.  Your rehabilitation program will include the following services:  Physical Therapy (PT), Occupational Therapy (OT), Speech Therapy (ST), 24 hour per day rehabilitation nursing, Therapeutic Recreaction (TR), Neuropsychology, Case Management (Social Worker), Rehabilitation Medicine, Nutrition Services and Pharmacy Services  Weekly team conferences will be held on Wednesday to discuss your progress.  Your Social Worker will talk with you frequently to get your input and to update you on team discussions.  Team conferences with you and your family in attendance may also be held.  Expected length of stay: 5-7 days  Overall anticipated outcome: supervision level  Depending on your progress and recovery, your program may change. Your Social Worker will coordinate services and will keep you informed of any changes. Your Social Worker's name and contact numbers are listed  below.  The following services may also be recommended but are not provided by the [facility]:   Home Health Rehabiltiation Services  Outpatient Rehabilitation Services    Arrangements will be made to provide these services after discharge if needed.  Arrangements include referral to agencies that provide these services.  Your insurance has been verified to be:  UHC-Medicare Your primary doctor is:  Dr Magdalene Patricia  Pertinent information will be shared with your doctor and your insurance company.  Social Worker:   Dossie Der, SW [phone] or ([phone]  Information discussed with and copy given to patient by: Lucy Chris, 03/15/2017, 10:43 AM

## 2017-03-15 NOTE — Progress Notes (Signed)
Social Work  Social Work Assessment and Plan  [name]: [id number] Date of Birth: [birth year]  Today's Date: 03/15/2017  Problem List:  Patient Active Problem List   Diagnosis Date Noted  . Left middle cerebral artery stroke (HCC) 03/14/2017  . History of CVA (cerebrovascular accident)   . Closed displaced fracture of proximal phalanx of right little finger   . Hyperlipidemia   . Chronic ischemic left MCA stroke   . Acute ischemic left middle cerebral artery (MCA) stroke (HCC)   . Acute blood loss anemia   . Hypokalemia   . History of CVA with residual deficit   . Dysarthria, post-stroke   . Expressive aphasia   . History of ST elevation myocardial infarction (STEMI)   . Benign essential HTN   . History of seizure   . History of memory loss   . CAD (coronary artery disease) 03/11/2017  . Displaced fracture of proximal phalanx of left little finger, initial encounter for closed fracture 03/11/2017  . Atrial fibrillation (HCC) 10/19/2013  . 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 Aphasia as late effect of cerebrovascular accident 09/25/2012  . Embolic cerebral infarction (HCC) 08/16/2012  . Acute ischemic left MCA stroke (HCC) 08/13/2012  . 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 07/25/12 07/28/2012  . Hyperlipidemia LDL goal < 70 07/24/2012  . STEMI (ST elevation myocardial infarction), secondary to disease of RCA, with disease of LAD and diag. branch and chronic non dominant LCX occlusion for CABG  07/21/2012  . Essential hypertension 07/21/2012  . Tobacco abuse 07/21/2012   Past Medical History:  Past Medical History:  Diagnosis Date  . Acute ischemic left MCA stroke (HCC) 03/11/2017  . Hyperlipidemia LDL goal < 70 07/24/2012  . Hypertension   . 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 07/25/12 07/28/2012   POSTOPERATIVE DIAGNOSIS: Severe 3-vessel coronary disease, status post  myocardial infarction.  PROCEDURE: Median sternotomy, extracorporeal circulation, coronary  artery bypass grafting x5 (left internal mammary artery to LAD,  sequential saphenous vein graft to obtuse marginal 1, obtuse marginal 2,  and posterior descending with Y graft to 1st diagonal), endoscopic vein  harvest, left thigh.    . Seizures (HCC)    "years ago" (03/11/2017)  . STEMI (ST elevation myocardial infarction), secondary to disease of RCA, with disease of LAD and diag. branch and chronic non dominant LCX occlusion for CABG  07/21/2012  . Stroke [facility]) ?08/2012   "right sided weakness; speech isssues since" (03/11/2017)  . Tobacco abuse 07/21/2012   Past Surgical History:  Past Surgical History:  Procedure Laterality Date  . CARDIAC CATHETERIZATION    . CESAREAN SECTION  X 1  . CORONARY ARTERY BYPASS GRAFT N/A 07/25/2012   Procedure: CORONARY ARTERY BYPASS GRAFTING (CABG) times five with Left  Endoscopic Saphenous Vein Harvest and Left Internal  Mammary ;  Surgeon: Loreli Slot, MD;  Location: [facility];  Service: Open Heart Surgery;  Laterality: N/A;  . DILATION AND CURETTAGE OF UTERUS    . 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 LEFT HEART CATHETERIZATION WITH CORONARY ANGIOGRAM N/A 07/21/2012   Procedure: LEFT HEART CATHETERIZATION WITH CORONARY ANGIOGRAM;  Surgeon: [name], MD;  Location[facility] CATH LAB;  Service: Cardiovascular;  Laterality: N/A;  . LOOP RECORDER IMPLANT  08-15-2012   Medtronic LinQ implanted by Dr Johney Frame for cryptogenic stroke  .  TEE WITHOUT CARDIOVERSION N/A 08/15/2012   Procedure: TRANSESOPHAGEAL ECHOCARDIOGRAM (TEE);  Surgeon: Wendall Stade, MD;  Location: [facility] ENDOSCOPY;  Service: Cardiovascular;  Laterality: N/A;  . TUBAL LIGATION     Social History:  reports that she has been smoking cigarettes.  She started smoking about 4 years ago. She has a 16.50 pack-year smoking history. she has  never used smokeless tobacco. She reports that she does not drink alcohol or use drugs.  Family / Support Systems Marital Status: Single Patient Roles: Partner, Other (Comment)(sibling) Spouse/Significant Other: Robert [phone] Other Supports: Annette Dickens-sister [phone]   Lilly Buynum-Mom [phone] Anticipated Caregiver: Molly Maduro and stepdaughter-Janice  Ability/Limitations of Caregiver: Robeet works 12 hr shifts so Liborio Nixon will be there while he is working Medical laboratory scientific officer: 24/7 Family Dynamics: Close knit family who will pull together when one of them needs assist. They have been through this before in 2014 after pt's first stroke. She needed assist then also. Sister-Annette will be in and out to assist also.  Social History Preferred language: English Religion: Baptist Cultural Background: No issues Education: High School Read: Yes Write: Yes Employment Status: Disabled Date Retired/Disabled/Unemployed: 2014 Fish farm manager Issues: No issues Guardian/Conservator: None-according to MD pt is capable of making her own decisions while here, but will include Molly Maduro and Drinda Butts due to pt's speech/language issues from this stroke   Abuse/Neglect Abuse/Neglect Assessment Can Be Completed: Yes Physical Abuse: Denies Verbal Abuse: Denies Sexual Abuse: Denies Exploitation of patient/patient's resources: Denies Self-Neglect: Denies  Emotional Status Pt's affect, behavior adn adjustment status: Pt is motivated but tired from her stroke, she remembers being here in 2014 and working with therapies. Her speech deficits are worse this time, according to her sister who is here visiting. She wants to recover and get bck to her independent level. Recent Psychosocial Issues: other health issues-past CVA 2014 recovered from this and was mod/i level before this happened Pyschiatric History: No history deferred depression screen due to difficulty  communicating due to speech issues. Sister provided information and voiced due to stroke she has memory issues and is on aricept for this. Will follow and see if she recovers enough to see neuro-psych while here. Substance Abuse History: Tobacco aware needs to quit did before but started up again. Sister plans to help her with this.  Patient / Family Perceptions, Expectations & Goals Pt/Family understanding of illness & functional limitations: Pt and sister can explain her stroke and deficits. Both have spoken with the MD and feel their questions are answered. Pt with time can get a basic wants across. She has been through this before and knows what to expect. Both feel they are aware of her treatment plan involves. Premorbid pt/family roles/activities: girlfriend, sister, retiree, friend, church member, etc Anticipated changes in roles/activities/participation: resume Pt/family expectations/goals: Pt states: " see see." She lets her sister speak for her and get the point she is trying to make across. Sister states: " I hope she does as well as last time, she did good here."  [employer]: Other (Comment)(had in the past) Premorbid Home Care/DME Agencies: Other (Comment)(had in the past) Transportation available at discharge: Sister and friends Resource referrals recommended: Support group (specify)  Discharge Planning Living Arrangements: Spouse/significant other Support Systems: Spouse/significant other, Other relatives, Friends/neighbors, Psychologist, clinical community Type of Residence: Private residence Insurance Resources: Media planner (specify)(UHC-Medicare) Financial Resources: [employer] Financial Screen Referred: No Living Expenses: Psychologist, sport and exercise Management: Significant Other Does the patient have any problems obtaining your medications?: No Home Management:  Self and boyfriend Patient/Family Preliminary Plans: Return home with Molly Maduro and Robert's step-daughter-Janice  to provide care while Molly Maduro is working 12hr shifts. Pt will need 24 hr supervision at least due to her speech deficits. Will await therapy team evaluations and work on safe discharge plan. Social Work Anticipated Follow Up Needs: HH/OP, Support Group  Clinical Impression Pleasant female who is known to me from previous admit-2014. Her boyfriend and sister's are very supportive and involved in her care. They have arranged 24 hr care at discharge. Sister wants to get the aide back she had at the last admit. Will work on a safe discharge plan and see if would be appropriate once speech improves.   Lucy Chris 03/15/2017, 1:28 PM

## 2017-03-15 NOTE — Progress Notes (Signed)
Physical Therapy Session Note  [name]: [id number] Date of Birth: [birth year]  Today's Date: 03/15/2017 PT Individual Time: 0900-0930 PT Individual Time Calculation (min): 30 min   Short Term Goals: Week 1:   = LTGs  Skilled Therapeutic Interventions/Progress Updates:    Session focused on functional gait on unit and transfers with steadying assist for balance and cues for attention for R due to impaired vision (since initial CVA per pt report), balance assessments to indicate fall risk (see below for five time sit to stand and TUG results), and neuro re-ed for dynamic balance retraining and visual scanning using Biodex for postural control program. Pt demonstrates decreased ability for functional weightshifting during biodex activity requiring facilitation through trunk and pelvis for movements - performed on static and compliant surface (level 10-11). Cues throughout for NWB through R hand functionally during transfers, bed mobility, and step up/down onto Biodex.   Therapy Documentation Precautions:  Precautions Precautions: Fall Restrictions Weight Bearing Restrictions: Yes RUE Weight Bearing: Non weight bearing Other Position/Activity Restrictions: R hand   Pain: Pain Assessment Pain Assessment: No/denies pain   Balance: Timed Up and Go Test TUG: Normal TUG Normal TUG (seconds): 15(average of 3 trials)  Five times Sit to Stand Test (FTSS) Method: Use a straight back chair with a solid seat that is 16-18" high. Ask participant to sit on the chair with arms folded across their chest.   Instructions: "Stand up and sit down as quickly as possible 5 times, keeping your arms folded across your chest."   Measurement: Stop timing when the participant stands the 5th time.  TIME: ___17___ (in seconds)  Times > 13.6 seconds is associated with increased disability and morbidity (Guralnik, 2000) Times > 15 seconds is predictive of recurrent falls  in healthy individuals aged 14 and older (Buatois, et al., 2008) Normal performance values in community dwelling individuals aged 33 and older (Bohannon, 2006): o 60-69 years: 11.4 seconds o 70-79 years: 12.6 seconds o 80-89 years: 14.8 seconds  MCID: ? 2.3 seconds for Vestibular Disorders Wray Kearns, 2006)   See Function Navigator for Current Functional Status.   Therapy/Group: Individual Therapy  Karolee Stamps Darrol Poke, PT, DPT  03/15/2017, 10:06 AM

## 2017-03-15 NOTE — Evaluation (Signed)
Speech Language Pathology Assessment and Plan  [name]: [id number] Date of Birth: 07/21/1950  SLP Diagnosis: Aphasia;Dysphagia;Dysarthria;Cognitive Impairments  Rehab Potential: Good ELOS: 7-10 days     Today's Date: 03/15/2017 SLP Individual Time: 1300-1400 SLP Individual Time Calculation (min): 60 min   Problem List:  Patient Active Problem List   Diagnosis Date Noted  . Left middle cerebral artery stroke ([place]) 03/14/2017  . History of CVA (cerebrovascular accident)   . Closed displaced fracture of proximal phalanx of right little finger   . Hyperlipidemia   . Chronic ischemic left MCA stroke   . Acute ischemic left middle cerebral artery (MCA) stroke ([place])   . Acute blood loss anemia   . Hypokalemia   . History of CVA with residual deficit   . Dysarthria, post-stroke   . Expressive aphasia   . History of ST elevation myocardial infarction (STEMI)   . Benign essential HTN   . History of seizure   . History of memory loss   . CAD (coronary artery disease) 03/11/2017  . Displaced fracture of proximal phalanx of left little finger, initial encounter for closed fracture 03/11/2017  . Atrial fibrillation ([place]) 10/19/2013  . 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 Aphasia as late effect of cerebrovascular accident 09/25/2012  . Embolic cerebral infarction ([place]) 08/16/2012  . Acute ischemic left MCA stroke ([place]) 08/13/2012  . 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 07/25/12 07/28/2012  . Hyperlipidemia LDL goal < 70 07/24/2012  . STEMI (ST elevation myocardial infarction), secondary to disease of RCA, with disease of LAD and diag. branch and chronic non dominant LCX occlusion for CABG  07/21/2012  . Essential hypertension 07/21/2012  . Tobacco abuse 07/21/2012   Past Medical History:  Past Medical History:  Diagnosis Date  . Acute  ischemic left MCA stroke ([place]) 03/11/2017  . Hyperlipidemia LDL goal < 70 07/24/2012  . Hypertension   . 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 07/25/12 07/28/2012   POSTOPERATIVE DIAGNOSIS: Severe 3-vessel coronary disease, status post  myocardial infarction.  PROCEDURE: Median sternotomy, extracorporeal circulation, coronary  artery bypass grafting x5 (left internal mammary artery to LAD,  sequential saphenous vein graft to obtuse marginal 1, obtuse marginal 2,  and posterior descending with Y graft to 1st diagonal), endoscopic vein  harvest, left thigh.    . Seizures ([place])    "years ago" (03/11/2017)  . STEMI (ST elevation myocardial infarction), secondary to disease of RCA, with disease of LAD and diag. branch and chronic non dominant LCX occlusion for CABG  07/21/2012  . Stroke [facility]) ?08/2012   "right sided weakness; speech isssues since" (03/11/2017)  . Tobacco abuse 07/21/2012   Past Surgical History:  Past Surgical History:  Procedure Laterality Date  . CARDIAC CATHETERIZATION    . CESAREAN SECTION  X 1  . CORONARY ARTERY BYPASS GRAFT N/A 07/25/2012   Procedure: CORONARY ARTERY BYPASS GRAFTING (CABG) times five with Left  Endoscopic Saphenous Vein Harvest and Left Internal  Mammary ;  Surgeon: Melrose Nakayama, MD;  Location: [place];  Service: Open Heart Surgery;  Laterality: N/A;  . DILATION AND CURETTAGE OF UTERUS    . 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 LEFT HEART CATHETERIZATION WITH CORONARY ANGIOGRAM N/A 07/21/2012   Procedure: LEFT HEART CATHETERIZATION WITH CORONARY ANGIOGRAM;  Surgeon: Lorretta Harp, MD;  Location[facility] C[facility] LAB;  Service: Cardiovascular;  Laterality: N/A;  . LOOP RECORDER IMPLANT  08-15-2012   Medtronic LinQ implanted by Dr Rayann Heman for cryptogenic stroke  . TEE WITHOUT CARDIOVERSION N/A 08/15/2012   Procedure: TRANSESOPHAGEAL ECHOCARDIOGRAM (TEE);  Surgeon: Josue Hector, MD;  Location: [facility] ENDOSCOPY;  Service: Cardiovascular;  Laterality:  N/A;  . TUBAL LIGATION      Assessment / Plan / Recommendation Clinical Impression [name] [name] a 67 y.o. right handed femalewith past medical history of tobacco abuse, CVA July 2014 with residual right-sided weakness status post loop recorder placement maintained on Eliquis as well as aspirin (received CIR at that time),PAF,STEMI status post CABG,remote history ofseizures(currently not on any medications),?dementia(pt unsure why she was started on meds),hypertension. Patient lives with her boyfriend of 20 years who works 12 hour shifts. Patient reported to be independent prior to admission. One level home with level entry. Presented on 2/7/19with acute left MCA stroke. Per chart review, sister, and patient,patient was noted to have slurred speech and facial droop. Symptoms appear to have started on 2/6 when she felt right-sided weakness and had a fall. She presented to the ED on 2/8. X-rays of right hand were taken which showed fracture of right proximal fifth phalanx. CT head was obtained, reviewed, relatively unremarkable for acute intracranial process. MRI was done which showed left MCA and PCA territory infarct with patchy small acute ischemic nonhemorrhagic left MCA infarcts. Patient did not receive TPA. Neurology was consult and stroke workup initiated. Echocardiogram with ejection fraction of 60% no wall motion abnormalities. Patient with resultant exacerbation of right-sided weakness, cognitive deficits, balance impairments, expressive aphasia, and visual deficits. Hospital coursefurthercomplicated by hypokalemia and acute blood loss anemia.Tolerating a regular diet. Underwent closed reduction and splinting for displaced fracture of the proximal phalanx on the right little finger and nonweightbearing on right hand. Physical and occupational therapy evaluations completed with recommendations of physical medicine rehabilitation consult.   Pt admitted to CIR on 03/14/2017 and  evaluated for cognitive linguistic and swallow skills on 03/15/2017. Pt presents with moderate-severe expressive aphasia demonstrating phonemic paraphrasing, perseveration and moderate awareness of verbal errors, however increased diffculity correcting errors. Pt demonstrate halting speech at word level, occasion phrase level with moderate to severe impairment in repetition at word level. Pt demonstrates mild-moderate receptive aphasia for 1-2 step commands, oral apraxia noted, with appropriate response to yes/no questions. Pt demonstrated increased impairment in expressive compared to receptive abilities further supported Western Aphasia Battery Bedside assessment. Pt demonstrates impairment in emergent awareness and recall, however impacted by expressive deficits. Pt's baseline is difficulty to determine from prior CVA, however pt notes differences in speech, language and swallowing. Pt presented with mild oropharyngeal dysphagia with no overt s/s aspiration on least restrictive diet, regular textured foods and thin liquids.Pt demonstrates slight prolonged mastication, oral transit delay and right side weakness, however no oral residue noted. Skilled SLP will follow up for diet tolerance. Pt would benefit from skilled ST service in order to maximum functional independence and reduce burden of care.   Skilled Therapeutic Interventions          Skilled ST services focused on speech skills. SLP facilitated expressive speech skills utilizing [place] toolkit for common object naming, pt required Max A sentence completion and phonemic cues with 90% accuracy. Pt demonstrated ability to express wants and needs at word level and occasional phrases with Max A multimodal cues including gestures and yes/no questions. SLP reviewed treatment plan and pt was left in room with call bell within reach. Recommend  to continue skilled ST services.    SLP Assessment  Patient will need skilled Speech Lanaguage Pathology Services during  CIR admission    Recommendations  SLP Diet Recommendations: Thin Liquid Administration via: Straw;Cup Medication Administration: Whole meds with liquid Supervision: Patient able to self feed Compensations: Minimize environmental distractions;Small sips/bites Postural Changes and/or Swallow Maneuvers: Seated upright 90 degrees Oral Care Recommendations: Oral care BID Patient destination: Home Follow up Recommendations: Outpatient SLP;Home Health SLP;24 hour supervision/assistance Equipment Recommended: None recommended by SLP    SLP Frequency 3 to 5 out of 7 days   SLP Duration  SLP Intensity  SLP Treatment/Interventions 7-10 days   Minumum of 1-2 x/day, 30 to 90 minutes  Cognitive remediation/compensation;Cueing hierarchy;Dysphagia/aspiration precaution training;Functional tasks;Patient/family education    Pain Pain Assessment Pain Assessment: No/denies pain  Prior Functioning Cognitive/Linguistic Baseline: Baseline deficits Baseline deficit details: Some residual deficits from prior CVA (expressive aphasia) but unable to determine precise level of baseline functioning due to no famly present. Patient indicates that her expressive language and physical impairments are worse than the stroke "5 years ago" Type of Home: Apartment  Lives With: Significant other Available Help at Discharge: Available 24 hours/day;Family Vocation: Unemployed  Function:  Eating Eating   Modified Consistency Diet: No Eating Assist Level: Set up assist for   Eating Set Up Assist For: Opening containers;Cutting food       Cognition Comprehension Comprehension assist level: Understands basic 90% of the time/cues < 10% of the time  Expression   Expression assist level: Expresses basic 25 - 49% of the time/requires cueing 50 - 75% of the time. Uses single words/gestures.  Social Interaction Social Interaction assist level: Interacts appropriately 90% of the time - Needs monitoring or  encouragement for participation or interaction.  Problem Solving Problem solving assist level: Solves basic 50 - 74% of the time/requires cueing 25 - 49% of the time  Memory Memory assist level: Recognizes or recalls 50 - 74% of the time/requires cueing 25 - 49% of the time   Short Term Goals: Week 1: SLP Short Term Goal 1 (Week 1): Pt will tolerate regular textured foods and thin liquid diet with no overt s/s aspiration.  SLP Short Term Goal 2 (Week 1): Pt will name familar, common objects with Mod A, sentence completion and phonemic cues.  SLP Short Term Goal 3 (Week 1): Pt will verbally respond to basic questions in regrads to wants/needs with Mod A multimodal cues.  SLP Short Term Goal 4 (Week 1): Pt will self-monitor verbal and functional errors with Min A multimodal cues.  SLP Short Term Goal 5 (Week 1): Pt will correct verbal and functional errors with Mod A multimodal cues.  SLP Short Term Goal 6 (Week 1): Pt will follow 1 step commands with Min A multimodal cues.   Refer to Care Plan for Long Term Goals  Recommendations for other services: None   Discharge Criteria: Patient will be discharged from SLP if patient refuses treatment 3 consecutive times without medical reason, if treatment goals not met, if there is a change in medical status, if patient makes no progress towards goals or if patient is discharged from hospital.  The above assessment, treatment plan, treatment alternatives and goals were discussed and mutually agreed upon: by patient  Bhargav Barbaro  [facility] 03/15/2017, 4:38 PM

## 2017-03-15 NOTE — Progress Notes (Signed)
Patient information reviewed and entered into eRehab system by Teeghan Hammer, RN, CRRN, PPS Coordinator.  Information including medical coding and functional independence measure will be reviewed and updated through discharge.     Per nursing patient was given "Data Collection Information Summary for Patients in Inpatient Rehabilitation Facilities with attached "Privacy Act Statement-Health Care Records" upon admission.  

## 2017-03-15 NOTE — Evaluation (Signed)
Occupational Therapy Assessment and Plan  [name]: [id number] Date of Birth: 1950/09/11  OT Diagnosis: cognitive deficits, disturbance of vision, hemiplegia affecting dominant side and muscle weakness (generalized) Rehab Potential: Rehab Potential (ACUTE ONLY): Good ELOS: 5-7 days   Today's Date: 03/15/2017 OT Individual Time: 1400-1500 OT Individual Time Calculation (min): 60 min     Problem List:  Patient Active Problem List   Diagnosis Date Noted  . Left middle cerebral artery stroke ([place]) 03/14/2017  . History of CVA (cerebrovascular accident)   . Closed displaced fracture of proximal phalanx of right little finger   . Hyperlipidemia   . Chronic ischemic left MCA stroke   . Acute ischemic left middle cerebral artery (MCA) stroke ([place])   . Acute blood loss anemia   . Hypokalemia   . History of CVA with residual deficit   . Dysarthria, post-stroke   . Expressive aphasia   . History of ST elevation myocardial infarction (STEMI)   . Benign essential HTN   . History of seizure   . History of memory loss   . CAD (coronary artery disease) 03/11/2017  . Displaced fracture of proximal phalanx of left little finger, initial encounter for closed fracture 03/11/2017  . Atrial fibrillation ([place]) 10/19/2013  . 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 Aphasia as late effect of cerebrovascular accident 09/25/2012  . Embolic cerebral infarction ([place]) 08/16/2012  . Acute ischemic left MCA stroke ([place]) 08/13/2012  . 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 07/25/12 07/28/2012  . Hyperlipidemia LDL goal < 70 07/24/2012  . STEMI (ST elevation myocardial infarction), secondary to disease of RCA, with disease of LAD and diag. branch and chronic non dominant LCX occlusion for CABG  07/21/2012  . Essential hypertension 07/21/2012  . Tobacco abuse  07/21/2012    Past Medical History:  Past Medical History:  Diagnosis Date  . Acute ischemic left MCA stroke ([place]) 03/11/2017  . Hyperlipidemia LDL goal < 70 07/24/2012  . Hypertension   . 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 07/25/12 07/28/2012   POSTOPERATIVE DIAGNOSIS: Severe 3-vessel coronary disease, status post  myocardial infarction.  PROCEDURE: Median sternotomy, extracorporeal circulation, coronary  artery bypass grafting x5 (left internal mammary artery to LAD,  sequential saphenous vein graft to obtuse marginal 1, obtuse marginal 2,  and posterior descending with Y graft to 1st diagonal), endoscopic vein  harvest, left thigh.    . Seizures ([place])    "years ago" (03/11/2017)  . STEMI (ST elevation myocardial infarction), secondary to disease of RCA, with disease of LAD and diag. branch and chronic non dominant LCX occlusion for CABG  07/21/2012  . Stroke [facility]) ?08/2012   "right sided weakness; speech isssues since" (03/11/2017)  . Tobacco abuse 07/21/2012   Past Surgical History:  Past Surgical History:  Procedure Laterality Date  . CARDIAC CATHETERIZATION    . CESAREAN SECTION  X 1  . CORONARY ARTERY BYPASS GRAFT N/A 07/25/2012   Procedure: CORONARY ARTERY BYPASS GRAFTING (CABG) times five with Left  Endoscopic Saphenous Vein Harvest and Left Internal  Mammary ;  Surgeon: Melrose Nakayama, MD;  Location: [facility];  Service: Open Heart Surgery;  Laterality: N/A;  . DILATION AND CURETTAGE OF UTERUS    . 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 LEFT HEART CATHETERIZATION WITH CORONARY ANGIOGRAM N/A 07/21/2012   Procedure: LEFT HEART CATHETERIZATION  WITH CORONARY ANGIOGRAM;  Surgeon: Lorretta Harp, MD;  Location: [facility] CATH LAB;  Service: Cardiovascular;  Laterality: N/A;  . LOOP RECORDER IMPLANT  08-15-2012   Medtronic LinQ implanted by Dr Rayann Heman for cryptogenic stroke  . TEE WITHOUT CARDIOVERSION N/A 08/15/2012   Procedure: TRANSESOPHAGEAL ECHOCARDIOGRAM (TEE);   Surgeon: Josue Hector, MD;  Location: [facility] ENDOSCOPY;  Service: Cardiovascular;  Laterality: N/A;  . TUBAL LIGATION      Assessment & Plan Clinical Impression: [name] [name] a 67 y.o. right handed femalewith past medical history of tobacco abuse, CVA July 2014 with residual right-sided weakness status post loop recorder placement maintained on Eliquis as well as aspirin (received CIR at that time),PAF,STEMI status post CABG,remote history ofseizures(currently not on any medications),?dementia(pt unsure why she was started on meds),hypertension. Patient lives with her boyfriend of 20 years who works 12 hour shifts. Patient reported to be independent prior to admission. One level home with level entry. Presented on 2/7/19with acute left MCA stroke. Per chart review, sister, and patient,patient was noted to have slurred speech and facial droop. Symptoms appear to have started on 2/6 when she felt right-sided weakness and had a fall. She presented to the ED on 2/8. X-rays of right hand were taken which showed fracture of right proximal fifth phalanx. CT head was obtained, reviewed, relatively unremarkable for acute intracranial process. MRI was done which showed left MCA and PCA territory infarct with patchy small acute ischemic nonhemorrhagic left MCA infarcts. Patient did not receive TPA. Neurology was consult and stroke workup initiated. Echocardiogram with ejection fraction of 60% no wall motion abnormalities. Patient with resultant exacerbation of right-sided weakness, cognitive deficits, balance impairments, expressive aphasia, and visual deficits. Hospital coursefurthercomplicated by hypokalemia and acute blood loss anemia. Tolerating a regular diet. Underwent closed reduction and splinting for displaced fracture of the proximal phalanx on the right little finger and nonweightbearing on right hand. Physical and occupational therapy evaluations completed with recommendations of physical  medicine rehabilitation consult. Patient was admitted for a comprehensive rehabilitation program. Patient transferred to CIR on 03/14/2017 .    Patient currently requires min with basic self-care skills secondary to muscle weakness, decreased cardiorespiratoy endurance, decreased coordination, decreased visual acuity, decreased awareness, decreased problem solving, decreased safety awareness and decreased memory and decreased standing balance, decreased postural control, hemiplegia, decreased balance strategies and difficulty maintaining precautions.  Prior to hospitalization, patient could complete ADLs/IADLs with independent .  Patient will benefit from skilled intervention to decrease level of assist with basic self-care skills, increase independence with basic self-care skills and increase level of independence with iADL prior to discharge home with care partner.  Anticipate patient will require 24 hour supervision and follow up home health.  OT - End of Session Activity Tolerance: Tolerates 10 - 20 min activity with multiple rests Endurance Deficit: Yes OT Assessment Rehab Potential (ACUTE ONLY): Good OT Patient demonstrates impairments in the following area(s): Balance;Cognition;Safety;Motor OT Basic ADL's Functional Problem(s): Eating;Grooming;Bathing;Dressing;Toileting OT Transfers Functional Problem(s): Toilet;Tub/Shower OT Additional Impairment(s): Fuctional Use of Upper Extremity OT Plan OT Intensity: Minimum of 1-2 x/day, 45 to 90 minutes OT Frequency: 5 out of 7 days OT Duration/Estimated Length of Stay: 5-7 days OT Treatment/Interventions: UE/LE Coordination activities;Therapeutic Activities;Self Care/advanced ADL retraining;Functional electrical stimulation;Discharge planning;Balance/vestibular training;Cognitive remediation/compensation;Visual/perceptual remediation/compensation;Therapeutic Exercise;Functional mobility training;DME/adaptive equipment instruction;Neuromuscular  re-education;Psychosocial support;Splinting/orthotics;UE/LE Strength taining/ROM OT Self Feeding Anticipated Outcome(s): Set-up OT Basic Self-Care Anticipated Outcome(s): Supervision OT Toileting Anticipated Outcome(s): Supervision OT Bathroom Transfers Anticipated Outcome(s): Supervision OT Recommendation Patient destination: Home Follow  Up Recommendations: Home health OT Equipment Recommended: To be determined   Skilled Therapeutic Intervention Pt seen for OT Eval and ADL bathing/dressing session. Pt in supine upon arrival, agreeable to tx session. She ambulated throughout session with guarding assist, no AD. She gathered clothing from drawers in prep for showering task, requires encouragement to use R UE in function tasks, difficult to assess whether this is due to hemiplegia vs. Difficulty using with finger splint or due to pt's very long finger nails affecting her functional grasping abilities.  She bathed sit <> stand in shower, standing with steadying support of wall and grab bars to complete majority of shower. She returned to w/c to dress, standing to don shirt with steadying assist. She displays decreased safety awareness/ awareness of deficits during functional tasks requiring VCs for safety throughout. She completed grooming tasks standing at sink with CGA. Therapist planned to change dressing on finger splint following showering task. When removed, pt's 4th and 5th digits were white, RN made aware and assessed. Ordered to buddy tape fingers while pt rests and allow time for splint and fingers to dry.  Pt left seated in recliner at end of session, all needs in reach and QRB donned. Education provided throughout session regarding role of OT, POC, and d/c planning.   OT Evaluation Precautions/Restrictions  Precautions Precautions: Fall Precaution Comments: expressive aphasia Restrictions Weight Bearing Restrictions: Yes RUE Weight Bearing: Non weight bearing Other  Position/Activity Restrictions: R hand General Chart Reviewed: Yes Additional Pertinent History: hx R hemi Pain   No/ denies pain Home Living/Prior Functioning Home Living Living Arrangements: Spouse/significant other Available Help at Discharge: Available 24 hours/day, Family(Pt reports family/friends are piecing together 24 hr care) Type of Home: Apartment Home Access: Level entry Home Layout: One level Bathroom Shower/Tub: Tub/shower unit, Industrial/product designer: Yes  Lives With: Significant other Prior Function Level of Independence: Independent with basic ADLs, Independent with transfers, Independent with homemaking with ambulation, Independent with gait  Able to Take Stairs?: Yes Driving: No Vocation: Unemployed Leisure: Hobbies-yes (Comment) Comments: Pt reports she likes to walk outside when it's warm out Vision Baseline Vision/History: No visual deficits Patient Visual Report: Blurring of vision Vision Assessment?: Vision impaired- to be further tested in functional context Additional Comments: Difficult to assess due to aphasia, pt reports blurry vision but unable to confirm if this is new since most recent CVA Perception  Perception: Within Functional Limits Praxis Praxis: Intact Cognition Overall Cognitive Status: No family/caregiver present to determine baseline cognitive functioning Arousal/Alertness: Awake/alert Orientation Level: Person;Place;Situation Person: Oriented Place: Oriented Situation: Oriented Year: 2019 Month: February Day of Week: Correct Memory: Impaired Memory Impairment: Decreased recall of new information;Decreased short term memory Decreased Short Term Memory: Verbal basic;Functional basic Immediate Memory Recall: Sock;Blue;Bed Memory Recall: Bed Memory Recall Bed: Without Cue Attention: Selective Selective Attention: Appears intact Selective Attention Impairment: Verbal basic;Functional  basic Awareness: Impaired Awareness Impairment: Anticipatory impairment Problem Solving: Appears intact Safety/Judgment: Impaired Comments: Impulsive with decreaed safety awareness/ awareness of deficits Sensation Sensation Light Touch: Impaired by gross assessment Coordination Gross Motor Movements are Fluid and Coordinated: No Fine Motor Movements are Fluid and Coordinated: No Coordination and Movement Description: mild R hemiplegia UE>LE Finger Nose Finger Test: Decreased speed and accuracy R UE Motor  Motor Motor: Hemiplegia Motor - Skilled Clinical Observations: Mild R hemi  Trunk/Postural Assessment  Cervical Assessment Cervical Assessment: Within Functional Limits Thoracic Assessment Thoracic Assessment: Within Functional Limits Lumbar Assessment Lumbar Assessment: Within Functional Limits Postural Control Postural  Control: Within Functional Limits  Balance Balance Balance Assessed: Yes Dynamic Sitting Balance Dynamic Sitting - Balance Support: During functional activity;No upper extremity supported Dynamic Sitting - Level of Assistance: 5: Stand by assistance;4: Min assist Sitting balance - Comments: Sitting to complete bathing task Static Standing Balance Static Standing - Balance Support: During functional activity Static Standing - Level of Assistance: 5: Stand by assistance;4: Min assist Dynamic Standing Balance Dynamic Standing - Balance Support: During functional activity;No upper extremity supported Dynamic Standing - Level of Assistance: 4: Min assist Dynamic Standing - Comments: Standing to complete LB bathing/dressing tasks Extremity/Trunk Assessment RUE Assessment RUE Assessment: Exceptions to WFL(Full ROM in shoulder and elbow; unable to oppose all digits with weak gross grasp during functional tasks (did not formally assess due to [place] pre-cautions) LUE Assessment LUE Assessment: Within Functional Limits   See Function Navigator for Current  Functional Status.   Refer to Care Plan for Long Term Goals  Recommendations for other services: None    Discharge Criteria: Patient will be discharged from OT if patient refuses treatment 3 consecutive times without medical reason, if treatment goals not met, if there is a change in medical status, if patient makes no progress towards goals or if patient is discharged from hospital.  The above assessment, treatment plan, treatment alternatives and goals were discussed and mutually agreed upon: by patient  Leticia Mcdiarmid L 03/15/2017, 3:37 PM

## 2017-03-15 NOTE — Progress Notes (Signed)
Subjective/Complaints: Patient walking with physical therapy.  She does exhibit some hyperextension at the right knee.  She does recall that her prior stroke was in 2014.  Has had increased weakness on the right side as well as increased problems with her speech. Review of systems limited by dysarthria and aphasia  Objective: Vital Signs: Blood pressure 114/71, pulse 74, temperature 98 F (36.7 C), temperature source Oral, resp. rate (!) 21, height 5' (1.524 m), weight 43.1 kg (95 lb 0.3 oz), SpO2 100 %. No results found. Results for orders placed or performed during the hospital encounter of 03/11/17 (from the past 72 hour(s))  Basic metabolic panel     Status: Abnormal   Collection Time: 03/13/17  4:28 AM  Result Value Ref Range   Sodium 137 135 - 145 mmol/L   Potassium 5.3 (H) 3.5 - 5.1 mmol/L    Comment: NO VISIBLE HEMOLYSIS   Chloride 107 101 - 111 mmol/L   CO2 22 22 - 32 mmol/L   Glucose, Bld 89 65 - 99 mg/dL   BUN 8 6 - 20 mg/dL   Creatinine, Ser 0.72 0.44 - 1.00 mg/dL   Calcium 8.9 8.9 - 10.3 mg/dL   GFR calc non Af Amer >60 >60 mL/min   GFR calc Af Amer >60 >60 mL/min    Comment: (NOTE) The eGFR has been calculated using the CKD EPI equation. This calculation has not been validated in all clinical situations. eGFR's persistently <60 mL/min signify possible Chronic Kidney Disease.    Anion gap 8 5 - 15    Comment: Performed at [facility] [address] [place], [place] [address]  Magnesium     Status: None   Collection Time: 03/13/17  4:28 AM  Result Value Ref Range   Magnesium 1.9 1.7 - 2.4 mg/dL    Comment: Performed at [facility] [address] [place], [place] [address]     HEENT: poor dentition Cardio: RRR and no murmur Resp: CTA B/L and unlabored GI: BS positive and NT, ND Extremity:  Pulses positive and No Edema Skin:   Intact Neuro: Abnormal Motor 2- Right delt, bi, tri, grip, 3- R HF , KE, ADF, Abnormal FMC Ataxic/ dec FMC,  Dysarthric, Aphasic and Other Right homonymous hemianopsia Musc/Skel:  Normal Gen NAD   Assessment/Plan: 1. 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 Physiatrist is providing close team supervision and 24 hour management of active medical problems listed below. Physiatrist and rehab team continue to assess barriers to discharge/monitor patient progress toward functional and medical goals. FIM:                   Function - Comprehension Comprehension: Auditory Comprehension assist level: Understands basic 90% of the time/cues < 10% of the time  Function - Expression Expression: Verbal Expression assist level: Expresses basic 75 - 89% of the time/requires cueing 10 - 24% of the time. Needs helper to occlude trach/needs to repeat words.  Function - Social Interaction Social Interaction assist level: Interacts appropriately 75 - 89% of the time - Needs redirection for appropriate language or to initiate interaction.  Function - Problem Solving Problem solving assist level: Solves basic 75 - 89% of the time/requires cueing 10 - 24% of the time  Function - Memory Memory assist level: Recognizes or recalls 75 -  89% of the time/requires cueing 10 - 24% of the time  Medical Problem List and Plan: 1.  Right side weakness/aphasia secondary to acute left MCA 03/10/17  and history of PCA territory infarct July 2014- Eliquis PT, OT, SLP evals today 2.  DVT Prophylaxis/Anticoagulation: Eliquis 3. Pain Management: Tylenol as needed 4. Mood: Aricept 5 mg daily at bedtime 5. Neuropsych: This patient is capable of making decisions on her own behalf. 6. Skin/Wound Care: Routine skin checks 7. Fluids/Electrolytes/Nutrition: Routine I&O's with follow-up chemistries 8. Hypertension. Norvasc 10 mg daily,  lisinopril 40 mg daily Vitals:   03/14/17 1730 03/15/17 0530  BP: 126/70 114/71  Pulse: 89 74  Resp: 20 (!) 21  Temp: 98.3 F (36.8 C) 98 F (36.7 C)  SpO2: 100% 100%  controlled 2/12 9. Displaced fracture of the proximal phalanx on the right little finger after fall. Status post closed reduction and splinting. Nonweightbearing right hand 10. History of CAD with CABG. No chest pain or shortness of breath 11. Atrial fibrillation. Cardiac rate controlled. Continue Eliquis. Patient status post loop recorder 12. Hyperlipidemia. Lipitor 13. History of tobacco use. Counseling,may need  Nicoderm 14.  Hyperkalemia borderline will recheck in am   LOS (Days) 1 A FACE TO FACE EVALUATION WAS PERFORMED  Charlett Blake 03/15/2017, 6:25 AM

## 2017-03-15 NOTE — Evaluation (Signed)
Physical Therapy Assessment and Plan  [name]: [id number] Date of Birth: 07-09-1950  PT Diagnosis: Abnormality of gait, Coordination disorder, Difficulty walking, Hemiplegia dominant, Impaired sensation and Muscle weakness Rehab Potential: Excellent ELOS: 5-7 days   Today's Date: 03/15/2017 PT Individual Time: 0354-6568 PT Individual Time Calculation (min): 54 min    Problem List:  Patient Active Problem List   Diagnosis Date Noted  . Left middle cerebral artery stroke ([place]) 03/14/2017  . History of CVA (cerebrovascular accident)   . Closed displaced fracture of proximal phalanx of right little finger   . Hyperlipidemia   . Chronic ischemic left MCA stroke   . Acute ischemic left middle cerebral artery (MCA) stroke ([place])   . Acute blood loss anemia   . Hypokalemia   . History of CVA with residual deficit   . Dysarthria, post-stroke   . Expressive aphasia   . History of ST elevation myocardial infarction (STEMI)   . Benign essential HTN   . History of seizure   . History of memory loss   . CAD (coronary artery disease) 03/11/2017  . Displaced fracture of proximal phalanx of left little finger, initial encounter for closed fracture 03/11/2017  . Atrial fibrillation ([place]) 10/19/2013  . 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 Aphasia as late effect of cerebrovascular accident 09/25/2012  . Embolic cerebral infarction ([place]) 08/16/2012  . Acute ischemic left MCA stroke ([place]) 08/13/2012  . 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 07/25/12 07/28/2012  . Hyperlipidemia LDL goal < 70 07/24/2012  . STEMI (ST elevation myocardial infarction), secondary to disease of RCA, with disease of LAD and diag. branch and chronic non dominant LCX occlusion for CABG  07/21/2012  . Essential hypertension 07/21/2012  . Tobacco abuse 07/21/2012    Past  Medical History:  Past Medical History:  Diagnosis Date  . Acute ischemic left MCA stroke ([place]) 03/11/2017  . Hyperlipidemia LDL goal < 70 07/24/2012  . Hypertension   . 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 07/25/12 07/28/2012   POSTOPERATIVE DIAGNOSIS: Severe 3-vessel coronary disease, status post  myocardial infarction.  PROCEDURE: Median sternotomy, extracorporeal circulation, coronary  artery bypass grafting x5 (left internal mammary artery to LAD,  sequential saphenous vein graft to obtuse marginal 1, obtuse marginal 2,  and posterior descending with Y graft to 1st diagonal), endoscopic vein  harvest, left thigh.    . Seizures ([place])    "years ago" (03/11/2017)  . STEMI (ST elevation myocardial infarction), secondary to disease of RCA, with disease of LAD and diag. branch and chronic non dominant LCX occlusion for CABG  07/21/2012  . Stroke [facility]) ?08/2012   "right sided weakness; speech isssues since" (03/11/2017)  . Tobacco abuse 07/21/2012   Past Surgical History:  Past Surgical History:  Procedure Laterality Date  . CARDIAC CATHETERIZATION    . CESAREAN SECTION  X 1  . CORONARY ARTERY BYPASS GRAFT N/A 07/25/2012   Procedure: CORONARY ARTERY BYPASS GRAFTING (CABG) times five with Left  Endoscopic Saphenous Vein Harvest and Left Internal  Mammary ;  Surgeon: Melrose Nakayama, MD;  Location: [place];  Service: Open Heart Surgery;  Laterality: N/A;  . DILATION AND CURETTAGE OF UTERUS    . 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 LEFT HEART CATHETERIZATION WITH CORONARY ANGIOGRAM N/A 07/21/2012   Procedure: LEFT HEART CATHETERIZATION WITH CORONARY ANGIOGRAM;  Surgeon: Lorretta Harp, MD;  Location: [facility] CATH LAB;  Service: Cardiovascular;  Laterality: N/A;  . LOOP RECORDER IMPLANT  08-15-2012   Medtronic LinQ implanted by Dr Rayann Heman for cryptogenic stroke  . TEE WITHOUT CARDIOVERSION N/A 08/15/2012   Procedure: TRANSESOPHAGEAL ECHOCARDIOGRAM (TEE);  Surgeon: Josue Hector, MD;   Location: [facility] ENDOSCOPY;  Service: Cardiovascular;  Laterality: N/A;  . TUBAL LIGATION      Assessment & Plan Clinical Impression: Patient is a 67 y.o. right handed femalewith past medical history of tobacco abuse, CVA July 2014 with residual right-sided weakness status post loop recorder placement maintained on Eliquis as well as aspirin (received CIR at that time),PAF,STEMI status post CABG,remote history ofseizures(currently not on any medications),?dementia(pt unsure why she was started on meds),hypertension. Patient lives with her boyfriend of 20 years who works 12 hour shifts. Patient reported to be independent prior to admission. One level home with level entry. Presented on 2/7/19with acute left MCA stroke. Per chart review, sister, and patient,patient was noted to have slurred speech and facial droop. Symptoms appear to have started on 2/6 when she felt right-sided weakness and had a fall. She presented to the ED on 2/8. X-rays of right hand were taken which showed fracture of right proximal fifth phalanx. CT head was obtained, reviewed, relatively unremarkable for acute intracranial process. MRI was done which showed left MCA and PCA territory infarct with patchy small acute ischemic nonhemorrhagic left MCA infarcts. Patient did not receive TPA. Neurology was consult and stroke workup initiated. Echocardiogram with ejection fraction of 60% no wall motion abnormalities. Patient with resultant exacerbation of right-sided weakness, cognitive deficits, balance impairments, expressive aphasia, and visual deficits. Hospital coursefurthercomplicated by hypokalemia and acute blood loss anemia. Tolerating a regular diet. Underwent closed reduction and splinting for displaced fracture of the proximal phalanx on the right little finger and nonweightbearing on right hand. Physical and occupational therapy evaluations completed with recommendations of physical medicine rehabilitation consult.  Patient transferred to CIR on 03/14/2017 .   Patient currently requires min with mobility secondary to muscle weakness, decreased cardiorespiratoy endurance, decreased coordination and decreased standing balance, hemiplegia and decreased balance strategies.  Prior to hospitalization, patient was independent  with mobility and lived with Significant other in a [place] home.  Home access is  Level entry.  Patient will benefit from skilled PT intervention to maximize safe functional mobility, minimize fall risk and decrease caregiver burden for planned discharge home with 24 hour supervision.  Anticipate patient will benefit from follow up [place] at discharge.  PT - End of Session Activity Tolerance: Tolerates 10 - 20 min activity with multiple rests Endurance Deficit: Yes Endurance Deficit Description: decreased PT Assessment Rehab Potential (ACUTE/IP ONLY): Excellent PT Barriers to Discharge: Decreased caregiver support;Lack of/limited family support PT Barriers to Discharge Comments: Boyfriend works 12 hour shifts, step-daughter planning to help out when boyfriend is gone PT Patient demonstrates impairments in the following area(s): Balance;Endurance;Motor;Safety;Sensory PT Transfers Functional Problem(s): Bed Mobility;Bed to Chair;Car;Furniture;Floor PT Locomotion Functional Problem(s): Stairs;Ambulation PT Plan PT Intensity: Minimum of 1-2 x/day ,45 to 90 minutes PT Frequency: 5 out of 7 days PT Duration Estimated Length of Stay: 5-7 days PT Treatment/Interventions: Ambulation/gait training;Disease management/prevention;Pain management;Stair training;Visual/perceptual remediation/compensation;Wheelchair propulsion/positioning;Therapeutic Activities;Patient/family education;DME/adaptive equipment instruction;Balance/vestibular training;Cognitive remediation/compensation;Functional electrical stimulation;Psychosocial support;Therapeutic Exercise;UE/LE Strength taining/ROM;Skin care/wound  management;Functional mobility training;Community reintegration;Discharge planning;Neuromuscular re-education;Splinting/orthotics;UE/LE Coordination activities PT Transfers Anticipated Outcome(s): Mod I  PT Locomotion Anticipated Outcome(s): Mod I, household distance gait PT Recommendation Follow Up Recommendations: Home health PT Patient destination:  Home Equipment Recommended: To be determined  Skilled Therapeutic Intervention  Pt supine and agreeable to therapy, denies pain. Pt instructed patient in PT Evaluation and initiated treatment intervention; see below for results. Occasional cues during functional mobility to maintain NWB R hand. Performed Berg Balance Scale and educated pt on significance of results. Pt educated patient in [place], rehab potential, rehab goals, and discharge recommendations. Returned to room and ended session in supine, call bell within reach and all needs met.   PT Evaluation Precautions/Restrictions Precautions Precautions: Fall Restrictions Weight Bearing Restrictions: Yes RUE Weight Bearing: Non weight bearing Other Position/Activity Restrictions: R hand General   Vital SignsTherapy Vitals Temp: 98 F (36.7 C) Temp Source: Oral Pulse Rate: 74 Resp: (!) 21 BP: 114/71 Patient Position (if appropriate): Lying Oxygen Therapy SpO2: 100 % O2 Device: Not Delivered Pain Pain Assessment Pain Assessment: No/denies pain Home Living/Prior Functioning Home Living Available Help at Discharge: Available 24 hours/day;Family(per chart review, boyfriend and stepdaughter planning to arrange almost 24/7 care) Type of Home: Apartment Home Access: Level entry Home Layout: One level Bathroom Shower/Tub: Tub/shower unit;Curtain Biochemist, clinical: Standard Bathroom Accessibility: Yes  Lives With: Significant other Prior Function Level of Independence: Independent with basic ADLs;Independent with transfers;Independent with homemaking with ambulation;Independent with  gait  Able to Take Stairs?: Yes Driving: No Vocation: Unemployed Leisure: Hobbies-yes (Comment) Comments: Pt reports she likes to walk outside when it's warm out Vision/Perception  Vision - Assessment Additional Comments: Pt reports impaired vision in R visual field from previous stroke, no change since most recent stroke Perception Perception: Within Functional Limits Praxis Praxis: Intact  Cognition Overall Cognitive Status: No family/caregiver present to determine baseline cognitive functioning Arousal/Alertness: Awake/alert Orientation Level: Oriented X4 Attention: Selective Selective Attention: Appears intact Memory: Appears intact Awareness: Appears intact Problem Solving: Appears intact Safety/Judgment: Appears intact Sensation Sensation Light Touch: Impaired by gross assessment(diminished sensation RLE) Coordination Gross Motor Movements are Fluid and Coordinated: No Fine Motor Movements are Fluid and Coordinated: No Finger Nose Finger Test: impaired R side, undershoots and decreased speed Heel Shin Test: impaired R side Motor  Motor Motor: Hemiplegia Motor - Skilled Clinical Observations: Mild R hemi   Mobility Bed Mobility Bed Mobility: Rolling Right;Rolling Left;Supine to Sit;Sit to Supine Rolling Right: 5: Supervision Rolling Left: 5: Supervision Supine to Sit: 5: Supervision Sit to Supine: 5: Supervision Transfers Transfers: Yes Sit to Stand: 4: Min guard Stand to Sit: 4: Min guard Stand Pivot Transfers: 4: Min guard Locomotion  Ambulation Ambulation: Yes Ambulation/Gait Assistance: 4: Min guard Ambulation Distance (Feet): 150 Feet Assistive device: None Gait Gait: Yes Gait Pattern: Impaired Gait Pattern: Wide base of support;Decreased trunk rotation;Shuffle Gait velocity: decreased Stairs / Additional Locomotion Stairs: Yes Stairs Assistance: 4: Min guard Stair Management Technique: One rail Left Number of Stairs: 12 Height of Stairs:  6 Ramp: 4: Min assist Curb: 4: Min Administrator Mobility: No  Trunk/Postural Assessment  Cervical Assessment Cervical Assessment: Within Functional Limits Thoracic Assessment Thoracic Assessment: Within Functional Limits Lumbar Assessment Lumbar Assessment: Within Functional Limits Postural Control Postural Control: Within Functional Limits  Balance Standardized Balance Assessment Standardized Balance Assessment: Berg Balance Test Berg Balance Test Sit to Stand: Able to stand without using hands and stabilize independently Standing Unsupported: Able to stand safely 2 minutes Sitting with Back Unsupported but Feet Supported on Floor or Stool: Able to sit safely and securely 2 minutes Stand to Sit: Sits safely with minimal use of hands Transfers: Able to transfer safely, minor use of hands  Standing Unsupported with Eyes Closed: Able to stand 10 seconds safely Standing Ubsupported with Feet Together: Able to place feet together independently and stand for 1 minute with supervision From Standing, Reach Forward with Outstretched Arm: Can reach confidently >25 cm (10") From Standing Position, Pick up Object from Floor: Able to pick up shoe safely and easily From Standing Position, Turn to Look Behind Over each Shoulder: Turn sideways only but maintains balance Turn 360 Degrees: Able to turn 360 degrees safely in 4 seconds or less Standing Unsupported, Alternately Place Feet on Step/Stool: Able to complete 4 steps without aid or supervision Standing Unsupported, One Foot in Front: Able to plae foot ahead of the other independently and hold 30 seconds Standing on One Leg: Tries to lift leg/unable to hold 3 seconds but remains standing independently Total Score: 47 Extremity Assessment  RLE Assessment RLE Assessment: Exceptions to WFL(4/5 globally) LLE Assessment LLE Assessment: Within Functional Limits   See Function Navigator for Current Functional  Status.   Refer to Care Plan for Long Term Goals  Recommendations for other services: None   Discharge Criteria: Patient will be discharged from PT if patient refuses treatment 3 consecutive times without medical reason, if treatment goals not met, if there is a change in medical status, if patient makes no progress towards goals or if patient is discharged from hospital.  The above assessment, treatment plan, treatment alternatives and goals were discussed and mutually agreed upon: by patient  Kden Wagster K Arnette 03/15/2017, 9:00 AM

## 2017-03-16 ENCOUNTER — Inpatient Hospital Stay (HOSPITAL_COMMUNITY): Payer: Medicare Other | Admitting: Occupational Therapy

## 2017-03-16 ENCOUNTER — Inpatient Hospital Stay (HOSPITAL_COMMUNITY): Payer: Medicare Other

## 2017-03-16 ENCOUNTER — Inpatient Hospital Stay (HOSPITAL_COMMUNITY): Payer: Medicare Other | Admitting: Physical Therapy

## 2017-03-16 NOTE — Progress Notes (Signed)
Occupational Therapy Session Note  [name]: [id number] Date of Birth: 01/21/1951  Today's Date: 03/16/2017 OT Individual Time: 1400-1500 OT Individual Time Calculation (min): 60 min    Short Term Goals: Week 1:  OT Short Term Goal 1 (Week 1): STG=LTG due to LOS  Skilled Therapeutic Interventions/Progress Updates:    Pt seen for OT ADL bathing/dressing session. Pt sitting up in recliner upon arrival with fmaily members present, pt agreeable to tx session. Prior to family leaving, family asked by OT about pt's PLOF. Family reports that langaguge deficits are biggest change since most recent stroke, they report that R UE function is at baseline level and pt with R eye blindness and field cut since CVA in 2014. Pt ambulated throughout room with close supervision, holding onto furniture intermittently for steadying assist. She gathered clothing items in prep for shower, pt required to name items as she obtained them requiring mod A overall for word finding and multimodal cuing for articulation for clarity.  Pt bathed seated on tub bench and then standing for thoroughness, maintaining dynamic standing blaance without UE support. She returned to bed to dress with increased time, standing with supervision to pull pants up.  She ambulated throughout unit with supervision, required max A for path finding to/from gym. Pt returned to room at end of session, left seated in recliner with all needs in reach, QRB donned and friend present.   Therapy Documentation Precautions:  Precautions Precautions: Fall Precaution Comments: expressive aphasia Restrictions Weight Bearing Restrictions: Yes RUE Weight Bearing: Non weight bearing Other Position/Activity Restrictions: R hand Pain:   No/denies pain  See Function Navigator for Current Functional Status.   Therapy/Group: Individual Therapy  Sherline Eberwein L 03/16/2017, 7:20 AM

## 2017-03-16 NOTE — Progress Notes (Signed)
Physical Therapy Session Note  [name]: [id number] Date of Birth: 04/22/1950  Today's Date: 03/16/2017 PT Individual Time: 0900-1000 AND 1420-1545 PT Individual Time Calculation (min): 60 min AND 25 min   Short Term Goals: Week 1:  PT Short Term Goal 1 (Week 1): =LTGs due to ELOS  Skilled Therapeutic Interventions/Progress Updates:   Pt received supine in bed and agreeable to PT. Supine>sit transfer with supevision assist and cues to prevent WB through the R hand  Session 1   PT instructed pt in gait training through rehab unit with supervision assist and min cues for attention to obstacles on the R 157f, [id number] and [id number] gait training through hospital gift shop with supervision assist x [id number] community environment. Pt instructed to locate 3 objects on second bout of gait training through gift shop to address visual deficits and short term memory. Pt able to retain 2 of 3 target items and locate all items with only min cues for improved visual scanning.   Nustep reciprocal movement and endurance training.   Dynamic balance training on Agility ladder:  1 foot in each space,  Side stepping. Min assist from PT for both tasks. With min-mod multimodal cues for step length, weight shifting, and improved posture.    Patient returned to room and [facility] and all needs met.    Session 2.   Gait through rehab unit 2x 200ft 55f with supervision assist.   Dynamic balance training to  Place and remove horse shoe from basketball goal with lateral reach to the L, while standing on airex pad; min-progressing to supervision assist. Additional dynamic balance training to weave through 6 cones x 4 with supervision assist. Min cues to compensate for visual deficits.   Patient returned to room and [facility] in [facility] in reach and all needs met.          Therapy  Documentation Precautions:  Precautions Precautions: Fall Precaution Comments: expressive aphasia Restrictions Weight Bearing Restrictions: Yes RUE Weight Bearing: Non weight bearing(Right hand) Other Position/Activity Restrictions: R hand Pain: Pain Assessment Pain Assessment: No/denies pain  See Function Navigator for Current Functional Status.   Therapy/Group: Individual Therapy  AustinLorie Phenix2019, 10:04 AM

## 2017-03-16 NOTE — Progress Notes (Signed)
Subjective/Complaints: Remains aphasic, difficult to elicit history Review of systems limited by dysarthria and aphasia  Objective: Vital Signs: Blood pressure 126/75, pulse 82, temperature 97.8 F (36.6 C), temperature source Oral, resp. rate 20, height 5' (1.524 m), weight 43.1 kg (95 lb 0.3 oz), SpO2 100 %. No results found. Results for orders placed or performed during the hospital encounter of 03/14/17 (from the past 72 hour(s))  CBC WITH DIFFERENTIAL     Status: None   Collection Time: 03/15/17  6:16 AM  Result Value Ref Range   WBC 5.4 4.0 - 10.5 K/uL   RBC 4.03 3.87 - 5.11 MIL/uL   Hemoglobin 12.5 12.0 - 15.0 g/dL   HCT 37.7 36.0 - 46.0 %   MCV 93.5 78.0 - 100.0 fL   MCH 31.0 26.0 - 34.0 pg   MCHC 33.2 30.0 - 36.0 g/dL   RDW 13.7 11.5 - 15.5 %   Platelets 245 150 - 400 K/uL   Neutrophils Relative % 42 %   Neutro Abs 2.3 1.7 - 7.7 K/uL   Lymphocytes Relative 43 %   Lymphs Abs 2.3 0.7 - 4.0 K/uL   Monocytes Relative 11 %   Monocytes Absolute 0.6 0.1 - 1.0 K/uL   Eosinophils Relative 3 %   Eosinophils Absolute 0.2 0.0 - 0.7 K/uL   Basophils Relative 1 %   Basophils Absolute 0.1 0.0 - 0.1 K/uL    Comment: Performed at [facility], [address], [place], [place] [address]  Comprehensive metabolic panel     Status: Abnormal   Collection Time: 03/15/17  6:16 AM  Result Value Ref Range   Sodium 138 135 - 145 mmol/L   Potassium 4.7 3.5 - 5.1 mmol/L   Chloride 102 101 - 111 mmol/L   CO2 25 22 - 32 mmol/L   Glucose, Bld 92 65 - 99 mg/dL   BUN 12 6 - 20 mg/dL   Creatinine, Ser 0.89 0.44 - 1.00 mg/dL   Calcium 9.4 8.9 - 10.3 mg/dL   Total Protein 6.2 (L) 6.5 - 8.1 g/dL   Albumin 3.3 (L) 3.5 - 5.0 g/dL   AST 27 15 - 41 U/L   ALT 19 14 - 54 U/L   Alkaline Phosphatase 57 38 - 126 U/L   Total Bilirubin 0.5 0.3 - 1.2 mg/dL   GFR calc non Af Amer >60 >60 mL/min   GFR calc Af Amer >60 >60 mL/min    Comment: (NOTE) The eGFR has been calculated using the CKD EPI  equation. This calculation has not been validated in all clinical situations. eGFR's persistently <60 mL/min signify possible Chronic Kidney Disease.    Anion gap 11 5 - 15    Comment: Performed at [facility] [address] [place], [place] [address]     HEENT: poor dentition Cardio: RRR and no murmur Resp: CTA B/L and unlabored GI: BS positive and NT, ND Extremity:  Pulses positive and No Edema Skin:   Intact Neuro: Abnormal Motor 2- Right delt, bi, tri, grip, 3- R HF , KE, ADF, Abnormal FMC Ataxic/ dec FMC, Dysarthric, Aphasic and Other Right homonymous hemianopsia Musc/Skel:  Normal Gen NAD   Assessment/Plan: 1. 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 Physiatrist is providing close team supervision and 24 hour management of active medical problems listed below. Physiatrist  and rehab team continue to assess barriers to discharge/monitor patient progress toward functional and medical goals. FIM: Function - Bathing Position: Shower Body parts bathed by patient: Right arm, Right lower leg, Left arm, Left lower leg, Chest, Abdomen, Front perineal area, Buttocks, Right upper leg, Left upper leg Body parts bathed by helper: Back Assist Level: Touching or steadying assistance(Pt > 75%)  Function- Upper Body Dressing/Undressing What is the patient wearing?: Pull over shirt/dress Pull over shirt/dress - Perfomed by patient: Thread/unthread right sleeve, Thread/unthread left sleeve, Put head through opening, Pull shirt over trunk Assist Level: Touching or steadying assistance(Pt > 75%)(Standing to don shirt) Function - Lower Body Dressing/Undressing What is the patient wearing?: Pants, Non-skid slipper socks Position: Wheelchair/chair at sink Pants- Performed by patient: Thread/unthread  right pants leg, Thread/unthread left pants leg Non-skid slipper socks- Performed by patient: Don/doff right sock, Don/doff left sock Assist for footwear: Supervision/touching assist Assist for lower body dressing: Touching or steadying assistance (Pt > 75%)  Function - Toileting Toileting steps completed by patient: Adjust clothing prior to toileting, Performs perineal hygiene, Adjust clothing after toileting Assist level: Touching or steadying assistance (Pt.75%)  Function - Air cabin crew transfer assistive device: Grab bar Assist level to toilet: Touching or steadying assistance (Pt > 75%) Assist level from toilet: Touching or steadying assistance (Pt > 75%)  Function - Chair/bed transfer Chair/bed transfer method: Ambulatory Chair/bed transfer assist level: Touching or steadying assistance (Pt > 75%) Chair/bed transfer assistive device: Armrests Chair/bed transfer details: Verbal cues for precautions/safety, Verbal cues for technique  Function - Locomotion: Wheelchair Will patient use wheelchair at discharge?: No Function - Locomotion: Ambulation Assistive device: No device Max distance: 150' Assist level: Touching or steadying assistance (Pt > 75%) Assist level: Touching or steadying assistance (Pt > 75%) Assist level: Touching or steadying assistance (Pt > 75%) Assist level: Touching or steadying assistance (Pt > 75%) Assist level: Touching or steadying assistance (Pt > 75%)  Function - Comprehension Comprehension: Auditory Comprehension assist level: Understands basic 90% of the time/cues < 10% of the time  Function - Expression Expression: Verbal Expression assist level: Expresses basic 25 - 49% of the time/requires cueing 50 - 75% of the time. Uses single words/gestures.  Function - Social Interaction Social Interaction assist level: Interacts appropriately 90% of the time - Needs monitoring or encouragement for participation or interaction.  Function -  Problem Solving Problem solving assist level: Solves basic 50 - 74% of the time/requires cueing 25 - 49% of the time  Function - Memory Memory assist level: Recognizes or recalls 50 - 74% of the time/requires cueing 25 - 49% of the time Patient normally able to recall (first 3 days only): Current season, That he or she is in a hospital  Medical Problem List and Plan: 1.  Right side weakness/aphasia secondary to acute left MCA 03/10/17  and history of PCA territory infarct July 2014- Eliquis PT, OT, SLP Team conference today please see physician documentation under team conference tab, met with team face-to-face to discuss problems,progress, and goals. Formulized individual treatment plan based on medical history, underlying problem and comorbidities. 2.  DVT Prophylaxis/Anticoagulation: Eliquis 3. Pain Management: Tylenol as needed 4. Mood: Aricept 5 mg daily at bedtime 5. Neuropsych: This patient is capable of making decisions on her own behalf. 6. Skin/Wound Care: Routine skin checks 7. Fluids/Electrolytes/Nutrition: Routine I&O's with follow-up chemistries 8. Hypertension. Norvasc 10 mg daily, lisinopril 40 mg daily Vitals:   03/15/17 1447 03/16/17 0355  BP: 95/61 126/75  Pulse: 88 82  Resp: 20 20  Temp: 97.6 F (36.4 C) 97.8 F (36.6 C)  SpO2: 100% 100%  controlled 2/13 9. Displaced fracture of the proximal phalanx on the right little finger after fall. Status post closed reduction and splinting. Nonweightbearing right hand 10. History of CAD with CABG. No chest pain or shortness of breath 11. Atrial fibrillation. Cardiac rate controlled. Continue Eliquis. Patient status post loop recorder 12. Hyperlipidemia. Lipitor 13. History of tobacco use. Counseling,may need  Nicoderm 14.  Hyperkalemia borderline with recheck normal at 4.7   LOS (Days) 2 A FACE TO FACE EVALUATION WAS PERFORMED  Charlett Blake 03/16/2017, 9:25 AM

## 2017-03-16 NOTE — Progress Notes (Signed)
Speech Language Pathology Daily Session Note  Patient Details  Name: MAXYNE STEIDINGER MRN: [id number] Date of Birth: [birth year]  Today's Date: 03/16/2017 SLP Individual Time: 0802-0900 SLP Individual Time Calculation (min): 58 min  Short Term Goals: Week 1: SLP Short Term Goal 1 (Week 1): Pt will tolerate regular textured foods and thin liquid diet with no overt s/s aspiration.  SLP Short Term Goal 2 (Week 1): Pt will name familar, common objects with Mod A, sentence completion and phonemic cues.  SLP Short Term Goal 3 (Week 1): Pt will verbally respond to basic questions in regrads to wants/needs with Mod A multimodal cues.  SLP Short Term Goal 4 (Week 1): Pt will self-monitor verbal and functional errors with Min A multimodal cues.  SLP Short Term Goal 5 (Week 1): Pt will correct verbal and functional errors with Mod A multimodal cues.  SLP Short Term Goal 6 (Week 1): Pt will follow 2 step commands with Min A multimodal cues.   Skilled Therapeutic Interventions: Skilled [employer] focused on speech skills. Pt expressed frustration communicating to nurse tech about positioning in bed for breakfast. SLP discussed problem solving communication barriers utilizing  gestures/pointing  and pt returned demonstration. SLP instrcutred pt how to adjust bed position independently, Pt returned demonstration. SLP facillated expressive naming of common objects from [facility] toolkit  and basic function, pt demonstrated 80% accuracy given Max A phonmeic and written word cues. Pt demonstarted ability to repeat at word level given Min A written cues. Pt demonstarted ability to follow 1 step directions with objects and body movements with 100% accuarcy and 2 step with 70% accuarcy. SLP upgraded shiort term goal from 1 step to 2 step commands. SLP communicated with nursing staff and noted impulsivity/safety awareness, SLP assess further in future sessions.      Function:  Eating Eating                  Cognition Comprehension Comprehension assist level: Understands basic 90% of the time/cues < 10% of the time  Expression   Expression assist level: Expresses basic 25 - 49% of the time/requires cueing 50 - 75% of the time. Uses single words/gestures.  Social Interaction Social Interaction assist level: Interacts appropriately 90% of the time - Needs monitoring or encouragement for participation or interaction.  Problem Solving Problem solving assist level: Solves basic 50 - 74% of the time/requires cueing 25 - 49% of the time  Memory Memory assist level: Recognizes or recalls 50 - 74% of the time/requires cueing 25 - 49% of the time    Pain Pain Assessment Pain Assessment: No/denies pain  Therapy/Group: Individual Therapy  Kumari Sculley  [facility] 03/16/2017, 12:37 PM

## 2017-03-16 NOTE — IPOC Note (Signed)
Overall Plan of Care [facility]) Patient Details Name: [name] [name] MRN: [id number] DOB: 1950/09/28  Admitting Diagnosis: <principal problem not specified>  Hospital Problems: Active Problems:   Left middle cerebral artery stroke [facility])     Functional Problem List: Nursing Behavior, Bladder, Bowel, Endurance, Medication Management, Motor, Nutrition, Perception, Safety, Sensory, Skin Integrity  PT Balance, Endurance, Motor, Safety, Sensory  OT Balance, Cognition, Safety, Motor  SLP    TR         Basic ADL's: OT Eating, Grooming, Bathing, Dressing, Toileting     Advanced  ADL's: OT       Transfers: PT Bed Mobility, Bed to Chair, Car, Furniture, Floor  OT Toilet, Tub/Shower     Locomotion: PT Stairs, Ambulation     Additional Impairments: OT Fuctional Use of Upper Extremity  SLP Swallowing, Communication, Social Cognition expression, comprehension Memory, Awareness  TR      Anticipated Outcomes Item Anticipated Outcome  Self Feeding Set-up  Swallowing  Mod I   Basic self-care  Supervision  Toileting  Supervision   Bathroom Transfers Supervision  Bowel/Bladder  mod indip  Transfers  Mod I   Locomotion  Mod I, household distance gait  Communication  Mod A  Cognition  Mod A  Pain  less<2  Safety/Judgment  mod indip   Therapy Plan: PT Intensity: Minimum of 1-2 x/day ,45 to 90 minutes PT Frequency: 5 out of 7 days PT Duration Estimated Length of Stay: 5-7 days OT Intensity: Minimum of 1-2 x/day, 45 to 90 minutes OT Frequency: 5 out of 7 days OT Duration/Estimated Length of Stay: 5-7 days SLP Intensity: Minumum of 1-2 x/day, 30 to 90 minutes SLP Frequency: 3 to 5 out of 7 days SLP Duration/Estimated Length of Stay: 7-10 days     Team Interventions: Nursing Interventions Patient/Family Education, Skin Care/Wound Management, Psychosocial Support, Disease Management/Prevention, Medication Management, Cognitive Remediation/Compensation, Discharge Planning   PT interventions Ambulation/gait training, Disease management/prevention, Pain management, Stair training, Visual/perceptual remediation/compensation, Wheelchair propulsion/positioning, Therapeutic Activities, Patient/family education, DME/adaptive equipment instruction, Warden/ranger, Cognitive remediation/compensation, Functional electrical stimulation, Psychosocial support, Therapeutic Exercise, UE/LE Strength taining/ROM, Skin care/wound management, Functional mobility training, Community reintegration, Discharge planning, Neuromuscular re-education, Splinting/orthotics, UE/LE Coordination activities  OT Interventions UE/LE Coordination activities, Therapeutic Activities, Self Care/advanced ADL retraining, Functional electrical stimulation, Discharge planning, Balance/vestibular training, Cognitive remediation/compensation, Visual/perceptual remediation/compensation, Therapeutic Exercise, Functional mobility training, DME/adaptive equipment instruction, Neuromuscular re-education, Psychosocial support, Splinting/orthotics, UE/LE Strength taining/ROM  SLP Interventions Cognitive remediation/compensation, Cueing hierarchy, Dysphagia/aspiration precaution training, Functional tasks, Patient/family education  TR Interventions    SW/CM Interventions Discharge Planning, Psychosocial Support, Patient/Family Education   Barriers to Discharge MD  Medical stability and aphasia  Nursing      PT Decreased caregiver support, Lack of/limited family support Boyfriend works 12 hour shifts, setp-daughter planning to help out when boyfriend is gone for work  OT      SLP Decreased caregiver support    SW       Team Discharge Planning: Destination: PT-Home ,OT- Home , SLP-Home Projected Follow-up: PT-Home health PT, OT-  Home health OT, SLP-Outpatient SLP, Home Health SLP, 24 hour supervision/assistance Projected Equipment Needs: PT-To be determined, OT- To be determined, SLP-None recommended  by SLP Equipment Details: PT- , OT-  Patient/family involved in discharge planning: PT- Patient,  OT-Patient, SLP-Patient  MD ELOS: 6-10d Medical Rehab Prognosis:  Excellent Assessment:  67 y.o. right handed femalewith past medical history of tobacco abuse, CVA July 2014 with residual right-sided weakness status post loop recorder placement  maintained on Eliquis as well as aspirin (received CIR at that time),PAF,STEMI status post CABG,remote history ofseizures(currently not on any medications),?dementia(pt unsure why she was started on meds),hypertension. Patient lives with her boyfriend of 20 years who works 12 hour shifts. Patient reported to be independent prior to admission. One level home with level entry. Presented on 2/7/69with acute left MCA stroke. Per chart review, sister, and patient,patient was noted to have slurred speech and facial droop. Symptoms appear to have started on 2/6 when she felt right-sided weakness and had a fall. She presented to the ED on 2/8. X-rays of right hand were taken which showed fracture of right proximal fifth phalanx. CT head was obtained, reviewed, relatively unremarkable for acute intracranial process. MRI was done which showed left MCA and PCA territory infarct with patchy small acute ischemic nonhemorrhagic left MCA infarcts. Patient did not receive TPA. Neurology was consult and stroke workup initiated. Echocardiogram with ejection fraction of 60% no wall motion abnormalities. Patient with resultant exacerbation of right-sided weakness, cognitive deficits, balance impairments, expressive aphasia, and visual deficits. Hospital coursefurthercomplicated by hypokalemia and acute blood loss anemia. Tolerating a regular diet. Underwent closed reduction and splinting for displaced fracture of the proximal phalanx on the right little finger and nonweightbearing on right hand. Physical and occupational therapy evaluations completed with recommendations of  physical medicine rehabilitation consult  Now requiring 24/7 Rehab RN,MD, as well as CIR level PT, OT and SLP.  Treatment team will focus on ADLs and mobility with goals set at Sup   See Team Conference Notes for weekly updates to the plan of care

## 2017-03-16 NOTE — Patient Care Conference (Signed)
Inpatient RehabilitationTeam Conference and Plan of Care Update Date: 03/16/2017   Time: 10:55 AM    Patient Name: [name] [name]      Medical Record Number: [id number]  Date of Birth: 15-Jul-1950 Sex: Female         Room/Bed: 4W21C/4W21C-01 Payor Info: Payor: [employer]vertising copywriter MEDICARE / Plan: [employer] MEDICARE / Product Type: *No Product type* /    Admitting Diagnosis: CVA  Admit Date/Time:  03/14/2017  5:19 PM Admission Comments: No comment available   Primary Diagnosis:  <principal problem not specified> Principal Problem: <principal problem not specified>  Patient Active Problem List   Diagnosis Date Noted  . Left middle cerebral artery stroke (HCC) 03/14/2017  . History of CVA (cerebrovascular accident)   . Closed displaced fracture of proximal phalanx of right little finger   . Hyperlipidemia   . Chronic ischemic left MCA stroke   . Acute ischemic left middle cerebral artery (MCA) stroke (HCC)   . Acute blood loss anemia   . Hypokalemia   . History of CVA with residual deficit   . Dysarthria, post-stroke   . Expressive aphasia   . History of ST elevation myocardial infarction (STEMI)   . Benign essential HTN   . History of seizure   . History of memory loss   . CAD (coronary artery disease) 03/11/2017  . Displaced fracture of proximal phalanx of left little finger, initial encounter for closed fracture 03/11/2017  . Atrial fibrillation (HCC) 10/19/2013  . 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 Aphasia as late effect of cerebrovascular accident 09/25/2012  . Embolic cerebral infarction (HCC) 08/16/2012  . Acute ischemic left MCA stroke (HCC) 08/13/2012  . 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 07/25/12 07/28/2012  . Hyperlipidemia LDL goal < 70 07/24/2012  . STEMI (ST elevation myocardial infarction), secondary to disease of RCA, with disease of LAD and diag. branch  and chronic non dominant LCX occlusion for CABG  07/21/2012  . Essential hypertension 07/21/2012  . Tobacco abuse 07/21/2012    Expected Discharge Date: Expected Discharge Date: 03/21/17  Team Members Present: Physician leading conference: Dr. Claudette Laws Social Worker Present: Dossie Der, LCSW Nurse Present: Adora Fridge, RN PT Present: Harless Litten, PTA;Grier Rocher, PT OT Present: Johnsie Cancel, OT SLP Present: Colin Benton, SLP PPS Coordinator present : Tora Duck, RN, [facility]     Current Status/Progress Goal Weekly Team Focus  Medical   Patient a phasic, history of right homonymous hemianopsia from previous CVA.  Gait and balance increased since new stroke  Establish adequate indication to express medical information  Initiate therapy program   Bowel/Bladder   continent of bowel and bladder LBM 03-15-17 (per patient)  Maintain regular bowel pattern, remain continent of bowel and bladder   Assist with tolieting needs prn    Swallow/Nutrition/ Hydration   Regular and thin  Mod I  diet check tolerance    ADL's   Min A overall  Supervision- mod I overall due to langauge deficits  ADL/IADL re-training, R neuro re-ed, d/c planning, family ed   Mobility   min assist w/o AD >150', Berg 47/56, 12 stairs w/ min assist  Mod I overall, household mobility   higher level balance, endurance, functional mobility   Communication   Max-Mod A expressive   Mod A  Common naming, expressing want/needs, following 1-2 step directions   Safety/Cognition/ Behavioral Observations  Max-ModA emergent awareness to correct  verbal/functional errors  Mod A  monitor/correcting errors   Pain   no c/o pain  pain <3  Assess pain q shift and prn   Skin   no skin issues  remain free of skin issues   Assess skin q shift and prn      *See Care Plan and progress notes for long and short-term goals.     Barriers to Discharge  Current Status/Progress Possible Resolutions Date Resolved   Physician     Other (comments);Medical stability  A aphasia  Initiating therapy program  Continue CIR level PT OT speech      Nursing                  PT  Decreased caregiver support;Lack of/limited family support  Boyfriend works 12 hour shifts, setp-daughter planning to help out when boyfriend is gone for work              OT                  SLP Decreased caregiver support              SW                Discharge Planning/Teaching Needs:  Home with boyfriend who works 12 hr shifts but his step-daughter can be there while he is working.       Team Discussion:  Goals supervision-mod/i level.R-field cut and impulsive at times. Needs verbal cues for safety. Berg 47/56. Regular, thin diet. Tape two fingers together for stability. Has residual deficits from 2014 CVA  Revisions to Treatment Plan:  DC 2/18    Continued Need for Acute Rehabilitation Level of Care: The patient requires daily medical management by a physician with specialized training in physical medicine and rehabilitation for the following conditions: Daily direction of a multidisciplinary physical rehabilitation program to ensure safe treatment while eliciting the highest outcome that is of practical value to the patient.: Yes Daily medical management of patient stability for increased activity during participation in an intensive rehabilitation regime.: Yes Daily analysis of laboratory values and/or radiology reports with any subsequent need for medication adjustment of medical intervention for : Neurological problems  [name] [name], [name] [name] 03/16/2017, 12:59 PM

## 2017-03-17 ENCOUNTER — Inpatient Hospital Stay (HOSPITAL_COMMUNITY): Payer: Medicare Other | Admitting: Speech Pathology

## 2017-03-17 ENCOUNTER — Inpatient Hospital Stay (HOSPITAL_COMMUNITY): Payer: Medicare Other | Admitting: Occupational Therapy

## 2017-03-17 ENCOUNTER — Inpatient Hospital Stay (HOSPITAL_COMMUNITY): Payer: Medicare Other | Admitting: Physical Therapy

## 2017-03-17 NOTE — Progress Notes (Signed)
Speech Language Pathology Daily Session Note  [name]: [id number] Date of Birth: Jan 31, 1951  Today's Date: 03/17/2017 SLP Individual Time: 0900-1000 SLP Individual Time Calculation (min): 60 min  Short Term Goals: Week 1: SLP Short Term Goal 1 (Week 1): Pt will tolerate regular textured foods and thin liquid diet with no overt s/s aspiration.  SLP Short Term Goal 2 (Week 1): Pt will name familar, common objects with Mod A, sentence completion and phonemic cues.  SLP Short Term Goal 3 (Week 1): Pt will verbally respond to basic questions in regrads to wants/needs with Mod A multimodal cues.  SLP Short Term Goal 4 (Week 1): Pt will self-monitor verbal and functional errors with Min A multimodal cues.  SLP Short Term Goal 5 (Week 1): Pt will correct verbal and functional errors with Mod A multimodal cues.  SLP Short Term Goal 6 (Week 1): Pt will follow 2 step commands with Min A multimodal cues.   Skilled Therapeutic Interventions: Skilled treatment session focused on communication goals. SLP facilitated session by providing opportunities to select requested object from field of 3. Pt is independent with selection, demonstrating that she understands objects. Pt was able to sequence 3 pictures cards but required Max A to produce phrase about picture. Pt with phonemic and semantic paraphasias. Pt able to imitate correct model. No evidence of apraxia noted. Given auditory only object function, pt able to select correct object in field of 3. Pt was not able to read at the letter or word level or write at the letter or word level. Pt is aware of errors and makes attempts to correct but leds to more perseverative phonemic repetitions. Pt required phonemic cues to count money and name objects . Pt was returned to room, left upright in recliner with all needs within reach. Continue per current plan of care.      Function:    Cognition Comprehension Comprehension assist  level: Understands basic 90% of the time/cues < 10% of the time;Follows basic conversation/direction with extra time/assistive device  Expression   Expression assist level: Expresses basic 50 - 74% of the time/requires cueing 25 - 49% of the time. Needs to repeat parts of sentences.  Social Interaction Social Interaction assist level: Interacts appropriately 90% of the time - Needs monitoring or encouragement for participation or interaction.  Problem Solving Problem solving assist level: Solves basic 75 - 89% of the time/requires cueing 10 - 24% of the time;Solves basic 90% of the time/requires cueing < 10% of the time  Memory Memory assist level: Recognizes or recalls 50 - 74% of the time/requires cueing 25 - 49% of the time;Recognizes or recalls 75 - 89% of the time/requires cueing 10 - 24% of the time    Pain Pain Assessment Pain Assessment: No/denies pain  Therapy/Group: Individual Therapy  Jaryn Hocutt 03/17/2017, 10:55 AM

## 2017-03-17 NOTE — Progress Notes (Signed)
Social Work Patient ID: [name] [name], female   DOB: 02-25-1950, 67 y.o.   MRN: [id number]  Met with pt, boyfriend and spoke with sister via telephone to discuss team conference goals supervision-mod/i level and target discharge 2/18. Pt will plan to go to Mom's home until her PCS services are in place. All aware of her need for supervision due to her speech and language issues for safety. All in agreement with the plan and pleased she is doing so well in her therapies.

## 2017-03-17 NOTE — Progress Notes (Signed)
Physical Therapy Session Note  [name]: [id number] Date of Birth: 24-Jan-1951  Today's Date: 03/17/2017 PT Individual Time: 1450-1600 PT Individual Time Calculation (min): 70 min   Short Term Goals: Week 1:  PT Short Term Goal 1 (Week 1): =LTGs due to ELOS  Skilled Therapeutic Interventions/Progress Updates:   Pt received supine in bed and agreeable to PT. Supine>sit transfer with supervision assist.   Gait training to day room without AD and supervision assist. Additional gait training x 194f and 2041fthroughout treatment with supervision assist. .   Nustep reciprocal movement training BLE only x10 min level 3>4.   BLE NMR with 2.5 lb ankle weights. LAQ, reciprocal marches, ankle PF, HS curls x 12 BLE .   Standing balance with dual task to complete moderate difficulty peg board puzzle x 2 supervision assist for safety and moderate cues for problem solving and error detection. .   PT instructed pt in DGI; see below. Pt demonstrates increased fall risk with score of 16 ( <19 indicates increased fall risk.)   [facility] reach and all needs met.           Therapy Documentation Precautions:  Precautions Precautions: Fall Precaution Comments: expressive aphasia Restrictions Weight Bearing Restrictions: Yes RUE Weight Bearing: Non weight bearing(right hand- pinky ) Other Position/Activity Restrictions: R hand Pain: denies  Balance: Standardized Balance Assessment Standardized Balance Assessment: Dynamic Gait Index Dynamic Gait Index Level Surface: Normal Change in Gait Speed: Normal Gait with Horizontal Head Turns: Mild Impairment Gait with Vertical Head Turns: Moderate Impairment Gait and Pivot Turn: Mild Impairment Step Over Obstacle: Moderate Impairment Step Around Obstacles: Mild Impairment Steps: Mild Impairment Total Score: 16   See Function Navigator for Current  Functional Status.   Therapy/Group: Individual Therapy  AuLorie Phenix/14/2019, 3:56 PM

## 2017-03-17 NOTE — Progress Notes (Signed)
Subjective/Complaints: Pt uses Right hand during functional activities denies pain although she is aphasic Review of systems limited by dysarthria and aphasia  Objective: Vital Signs: Blood pressure 121/67, pulse 76, temperature 98.1 F (36.7 C), temperature source Oral, resp. rate 16, height 5' (1.524 m), weight 43.1 kg (95 lb 0.3 oz), SpO2 100 %. No results found. Results for orders placed or performed during the hospital encounter of 03/14/17 (from the past 72 hour(s))  CBC WITH DIFFERENTIAL     Status: None   Collection Time: 03/15/17  6:16 AM  Result Value Ref Range   WBC 5.4 4.0 - 10.5 K/uL   RBC 4.03 3.87 - 5.11 MIL/uL   Hemoglobin 12.5 12.0 - 15.0 g/dL   HCT 37.7 36.0 - 46.0 %   MCV 93.5 78.0 - 100.0 fL   MCH 31.0 26.0 - 34.0 pg   MCHC 33.2 30.0 - 36.0 g/dL   RDW 13.7 11.5 - 15.5 %   Platelets 245 150 - 400 K/uL   Neutrophils Relative % 42 %   Neutro Abs 2.3 1.7 - 7.7 K/uL   Lymphocytes Relative 43 %   Lymphs Abs 2.3 0.7 - 4.0 K/uL   Monocytes Relative 11 %   Monocytes Absolute 0.6 0.1 - 1.0 K/uL   Eosinophils Relative 3 %   Eosinophils Absolute 0.2 0.0 - 0.7 K/uL   Basophils Relative 1 %   Basophils Absolute 0.1 0.0 - 0.1 K/uL    Comment: Performed at [facility], [address] [place], [place] [address]  Comprehensive metabolic panel     Status: Abnormal   Collection Time: 03/15/17  6:16 AM  Result Value Ref Range   Sodium 138 135 - 145 mmol/L   Potassium 4.7 3.5 - 5.1 mmol/L   Chloride 102 101 - 111 mmol/L   CO2 25 22 - 32 mmol/L   Glucose, Bld 92 65 - 99 mg/dL   BUN 12 6 - 20 mg/dL   Creatinine, Ser 0.89 0.44 - 1.00 mg/dL   Calcium 9.4 8.9 - 10.3 mg/dL   Total Protein 6.2 (L) 6.5 - 8.1 g/dL   Albumin 3.3 (L) 3.5 - 5.0 g/dL   AST 27 15 - 41 U/L   ALT 19 14 - 54 U/L   Alkaline Phosphatase 57 38 - 126 U/L   Total Bilirubin 0.5 0.3 - 1.2 mg/dL   GFR calc non Af Amer >60 >60 mL/min   GFR calc Af Amer >60 >60 mL/min    Comment: (NOTE) The eGFR has  been calculated using the CKD EPI equation. This calculation has not been validated in all clinical situations. eGFR's persistently <60 mL/min signify possible Chronic Kidney Disease.    Anion gap 11 5 - 15    Comment: Performed at [facility] [address] [place], [place] [address]     HEENT: poor dentition Cardio: RRR and no murmur Resp: CTA B/L and unlabored GI: BS positive and NT, ND Extremity:  Pulses positive and No Edema Skin:   Intact Neuro: Abnormal Motor 2- Right delt, bi, tri, grip, 3- R HF , KE, ADF, Abnormal FMC Ataxic/ dec FMC, Dysarthric, Aphasic and Other Right homonymous hemianopsia Musc/Skel:  Normal Gen NAD   Assessment/Plan: 1. 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 Physiatrist is providing close team supervision and 24 hour management  of active medical problems listed below. Physiatrist and rehab team continue to assess barriers to discharge/monitor patient progress toward functional and medical goals. FIM: Function - Bathing Position: Shower Body parts bathed by patient: Right arm, Right lower leg, Left arm, Left lower leg, Chest, Abdomen, Front perineal area, Buttocks, Right upper leg, Left upper leg, Back Body parts bathed by helper: Back Assist Level: Supervision or verbal cues  Function- Upper Body Dressing/Undressing What is the patient wearing?: Pull over shirt/dress Pull over shirt/dress - Perfomed by patient: Thread/unthread right sleeve, Thread/unthread left sleeve, Put head through opening, Pull shirt over trunk Assist Level: Supervision or verbal cues Function - Lower Body Dressing/Undressing What is the patient wearing?: Pants, Non-skid slipper socks Position: Sitting EOB Pants- Performed by patient: Thread/unthread right pants leg,  Thread/unthread left pants leg Non-skid slipper socks- Performed by patient: Don/doff right sock, Don/doff left sock Assist for footwear: Supervision/touching assist Assist for lower body dressing: Supervision or verbal cues  Function - Toileting Toileting steps completed by patient: Adjust clothing prior to toileting, Performs perineal hygiene, Adjust clothing after toileting Assist level: Supervision or verbal cues  Function Midwife transfer assistive device: Grab bar Assist level to toilet: Supervision or verbal cues Assist level from toilet: Supervision or verbal cues  Function - Chair/bed transfer Chair/bed transfer method: Ambulatory Chair/bed transfer assist level: Supervision or verbal cues Chair/bed transfer assistive device: Armrests Chair/bed transfer details: Verbal cues for precautions/safety, Verbal cues for technique  Function - Locomotion: Wheelchair Will patient use wheelchair at discharge?: No Function - Locomotion: Ambulation Assistive device: No device Max distance: 229f  Assist level: Supervision or verbal cues Assist level: Supervision or verbal cues Assist level: Supervision or verbal cues Assist level: Supervision or verbal cues Assist level: Touching or steadying assistance (Pt > 75%)  Function - Comprehension Comprehension: Auditory Comprehension assist level: Understands basic 90% of the time/cues < 10% of the time  Function - Expression Expression: Verbal Expression assist level: Expresses basic 25 - 49% of the time/requires cueing 50 - 75% of the time. Uses single words/gestures.  Function - Social Interaction Social Interaction assist level: Interacts appropriately 90% of the time - Needs monitoring or encouragement for participation or interaction.  Function - Problem Solving Problem solving assist level: Solves basic 50 - 74% of the time/requires cueing 25 - 49% of the time  Function - Memory Memory assist level:  Recognizes or recalls 50 - 74% of the time/requires cueing 25 - 49% of the time Patient normally able to recall (first 3 days only): Current season, That he or she is in a hospital  Medical Problem List and Plan: 1.  Right side weakness/aphasia secondary to acute left MCA 03/10/17  and history of PCA territory infarct July 2014- Eliquis CIR level PT, OT, SLP at least 3h per day 2.  DVT Prophylaxis/Anticoagulation: Eliquis 3. Pain Management: Tylenol as needed 4. Mood: Aricept 5 mg daily at bedtime 5. Neuropsych: This patient is capable of making decisions on her own behalf. 6. Skin/Wound Care: Routine skin checks 7. Fluids/Electrolytes/Nutrition: Routine I&O's with follow-up chemistries 8. Hypertension. Norvasc 10 mg daily, lisinopril 40 mg daily Vitals:   03/16/17 1342 03/17/17 0121  BP: (!) 94/49 121/67  Pulse: 83 76  Resp: 16 16  Temp: 98.2 F (36.8 C) 98.1 F (36.7 C)  SpO2: 100% 100%  controlled 2/14 9. Displaced fracture of the proximal phalanx on the right little finger after fall. Status post closed reduction and splinting. Nonweightbearing right hand, buddy  tape as well 10. History of CAD with CABG. No chest pain or shortness of breath 11. Atrial fibrillation. Cardiac rate controlled. Continue Eliquis. Patient status post loop recorder 12. Hyperlipidemia. Lipitor 13. History of tobacco use. Counseling,may need  Nicoderm 14.  Hyperkalemia borderline with recheck normal at 4.7 15.  Aphasia following prior stroke SLP will ask family about baseline language deficits  LOS (Days) 3 A FACE TO FACE EVALUATION WAS PERFORMED  Charlett Blake 03/17/2017, 8:14 AM

## 2017-03-17 NOTE — Progress Notes (Signed)
Occupational Therapy Session Note  [name]: [id number] Date of Birth: [birth year]  Today's Date: 03/17/2017 OT Individual Time: 1300-1400 OT Individual Time Calculation (min): 60 min    Short Term Goals: Week 1:  OT Short Term Goal 1 (Week 1): STG=LTG due to LOS  Skilled Therapeutic Interventions/Progress Updates:    Pt seen for OT session focusing on word finding abilities and functional communication. Pt in supine upon arrival, ready for tx session. She denied need for bathing/dressing this PM.  She ambulated throughout session with supervision. In therapy day room, pt made Valentine's Card for her husband, required assist for spelling her name as she was able to use L hand to write name and self-recognized she was missing a letter, however, unable to problem solve mistake.  Addressed pt creating and annunciating letters of her name, requiring mod-max A to initiate word. Completed x5 trials with same assist required for all trials. Pt very motivated to cont to work on Counsellor.  Pt then required to name common objects on place cards, mod A overall and upgraded to have pt completing at simple sentence level, with max cuing.  Pt ambulated back to room at end of session, left in supine with all needs in reach.   Therapy Documentation Precautions:  Precautions Precautions: Fall Precaution Comments: expressive aphasia Restrictions Weight Bearing Restrictions: Yes RUE Weight Bearing: Non weight bearing(Right hand) Other Position/Activity Restrictions: R hand Pain:   No/ denies pain  See Function Navigator for Current Functional Status.   Therapy/Group: Individual Therapy  Aki Abalos L 03/17/2017, 7:21 AM

## 2017-03-18 ENCOUNTER — Inpatient Hospital Stay (HOSPITAL_COMMUNITY): Payer: Medicare Other | Admitting: Physical Therapy

## 2017-03-18 ENCOUNTER — Inpatient Hospital Stay (HOSPITAL_COMMUNITY): Payer: Medicare Other | Admitting: Occupational Therapy

## 2017-03-18 ENCOUNTER — Inpatient Hospital Stay (HOSPITAL_COMMUNITY): Payer: Medicare Other | Admitting: Speech Pathology

## 2017-03-18 MED ORDER — LISINOPRIL 20 MG PO TABS
30.0000 mg | ORAL_TABLET | Freq: Every day | ORAL | Status: DC
Start: 2017-03-19 — End: 2017-03-20
  Administered 2017-03-19 – 2017-03-20 (×2): 30 mg via ORAL
  Filled 2017-03-18 (×2): qty 1

## 2017-03-18 NOTE — Progress Notes (Signed)
Speech Language Pathology Daily Session Note  [name]: [id number] Date of Birth: [birth year]  Today's Date: 03/18/2017   Skilled treatment session #1 SLP Individual Time: 3299-2426 SLP Individual Time Calculation (min): 45 min   Skilled treatment session #2 SLP Individual Time: 1115-1200 SLP Individual Time Calculation (min): 45 min    Short Term Goals: Week 1: SLP Short Term Goal 1 (Week 1): Pt will tolerate regular textured foods and thin liquid diet with no overt s/s aspiration.  SLP Short Term Goal 1 - Progress (Week 1): Met SLP Short Term Goal 2 (Week 1): Pt will name familar, common objects with Mod A, sentence completion and phonemic cues.  SLP Short Term Goal 3 (Week 1): Pt will verbally respond to basic questions in regrads to wants/needs with Mod A multimodal cues.  SLP Short Term Goal 4 (Week 1): Pt will self-monitor verbal and functional errors with Min A multimodal cues.  SLP Short Term Goal 5 (Week 1): Pt will correct verbal and functional errors with Mod A multimodal cues.  SLP Short Term Goal 6 (Week 1): Pt will follow 2 step commands with Min A multimodal cues.   Skilled Therapeutic Interventions:  Skilled treatment session #1 focused on dysphagia and communication goals. SLP facilitated session by providing skilled observation of pt consuming regular breakfast with thin liquids. Pt with no overt s/s of aspiration or oral phase deficits. Dysphagia goals met. SLP further facilitated session by providing additional wait times for pt to name common objects. With extra time, pt able to implement her own word finding strategies to name 14 out of 15 objects. Pt able to self monitor independently and correct with extra time. Pt was returned to room, left in bed with all needs within reach.   Skilled treatment session #2 focused on communication goals. SLP facilitated session by providing Mod I to name pictures of common objects and to match  object picture to printed word with > 95% accuracy. Pt able to verbally finish simple phrases with appropriate word for > 95% accuracy. Pt with great progress this session.      Function:  Eating Eating   Modified Consistency Diet: No Eating Assist Level: Swallowing techniques: self managed;No help, No cues   Eating Set Up Assist For: Opening containers;Cutting food       Cognition Comprehension Comprehension assist level: Follows basic conversation/direction with no assist  Expression   Expression assist level: Expresses basic 50 - 74% of the time/requires cueing 25 - 49% of the time. Needs to repeat parts of sentences.;Expresses basic 25 - 49% of the time/requires cueing 50 - 75% of the time. Uses single words/gestures.  Social Interaction Social Interaction assist level: Interacts appropriately 90% of the time - Needs monitoring or encouragement for participation or interaction.  Problem Solving Problem solving assist level: Solves basic 75 - 89% of the time/requires cueing 10 - 24% of the time;Solves basic 90% of the time/requires cueing < 10% of the time  Memory Memory assist level: Recognizes or recalls 90% of the time/requires cueing < 10% of the time    Pain Pain Assessment Pain Assessment: No/denies pain Pain Score: 0-No pain  Therapy/Group: Individual Therapy  Jacayla Nordell 03/18/2017, 9:53 AM

## 2017-03-18 NOTE — Progress Notes (Signed)
Physical Therapy Session Note  [name]: [id number] Date of Birth: 08/28/1950  Today's Date: 03/18/2017 PT Individual Time: 1420-1500 PT Individual Time Calculation (min): 40 min   Short Term Goals: Week 1:  PT Short Term Goal 1 (Week 1): =LTGs due to ELOS  Skilled Therapeutic Interventions/Progress Updates:   Pt received sitting in WC and agreeable to PT.   Gait training throughout hospital to simulate community environment inclduing up/down grand stair case with 1 UE support, through hospital gift ship, around food court area and back to rehab unit. Pt instructed in locating and naming 3 items in gift shop(purse, flower, mug) moderate cues to problem solve location of mug in gift shop due to aphasia.   Dynamic balance training instructed by PT to complete peg board puzzle of moderate difficulty. Min-mod cues for problem solving and color identification throughout. Supervision assist to maintain balance.   Patient returned to room and left sitting in recliner with call bell in reach and all needs met.         Therapy Documentation Precautions:  Precautions Precautions: Fall Precaution Comments: expressive aphasia Restrictions Weight Bearing Restrictions: Yes RUE Weight Bearing: Non weight bearing(Right hand) Other Position/Activity Restrictions: R hand Pain: 0/10   See Function Navigator for Current Functional Status.   Therapy/Group: Individual Therapy  Lorie Phenix 03/18/2017, 5:27 PM

## 2017-03-18 NOTE — Discharge Instructions (Signed)
Inpatient Rehab Discharge Instructions  [name] [name] Discharge date and time: No discharge date for patient encounter.   Activities/Precautions/ Functional Status: Activity: activity as tolerated Diet: regular diet Wound Care: none needed Functional status:  ___ No restrictions     ___ Walk up steps independently ___ 24/7 supervision/assistance   ___ Walk up steps with assistance ___ Intermittent supervision/assistance  ___ Bathe/dress independently ___ Walk with walker     _x__ Bathe/dress with assistance ___ Walk Independently    ___ Shower independently ___ Walk with assistance    ___ Shower with assistance ___ No alcohol     ___ Return to work/school ________  Special Instructions:  No driving or smoking   COMMUNITY REFERRALS UPON DISCHARGE:    Home Health:   PT, OT, SP, AIDE  Agency:[facility] Phone:[phone]   Date of last service:03/21/2017  Medical Equipment/Items Ordered:NO NEEDS   Other:PCS REFERRAL MADE INTAKE WILL CONTACT ROBERT TO SET UP  GENERAL COMMUNITY RESOURCES FOR PATIENT/FAMILY: Support Groups:[facility] SUPPORT GROUP EVERY SECOND Thursday @ 3:00-4:00 PM ON THE REHAB UNIT QUESTIONS CONTACT CAITLIN [phone]  My questions have been answered and I understand these instructions. I will adhere to these goals and the provided educational materials after my discharge from the hospital.  Patient/Caregiver Signature _______________________________ Date __________  Clinician Signature _______________________________________ Date __________  Please bring this form and your medication list with you to all your follow-up doctor's appointments.

## 2017-03-18 NOTE — Plan of Care (Signed)
  Progressing Consults RH STROKE PATIENT EDUCATION Description See Patient Education module for education specifics  03/18/2017 1153 - Progressing by Marylene Land, RN Nutrition Consult-if indicated 03/18/2017 1153 - Progressing by Marylene Land, RN RH SAFETY RH STG ADHERE TO SAFETY PRECAUTIONS W/ASSISTANCE/DEVICE Description STG Adhere to Safety Precautions With min Assistance/Device.   03/18/2017 1153 - Progressing by Marylene Land, RN Flowsheets Taken 03/18/2017 1153  STG:Pt will adhere to safety precautions with assistance/device 5-Supervision/set up RH COGNITION-NURSING RH STG ANTICIPATES NEEDS/CALLS FOR ASSIST W/ASSIST/CUES Description STG Anticipates Needs/Calls for Assist With mod Assistance/Cues.  03/18/2017 1153 - Progressing by Marylene Land, RN Flowsheets Taken 03/18/2017 1153  STG: Anticipates needs/calls for assistance with assistance/cues 5-Supervision/set up RH KNOWLEDGE DEFICIT RH STG INCREASE KNOWLEDGE OF HYPERTENSION Description Patient/family able to verbalize sign/symptoms hypo/hypertension of min assistance  03/18/2017 1153 - Progressing by Marylene Land, RN

## 2017-03-18 NOTE — Progress Notes (Signed)
Subjective/Complaints:  Review of systems limited by dysarthria and aphasia  Objective: Vital Signs: Blood pressure (!) 123/59, pulse 78, temperature 97.9 F (36.6 C), temperature source Oral, resp. rate 18, height 5' (1.524 m), weight 43.1 kg (95 lb 0.3 oz), SpO2 100 %. No results found. No results found for this or any previous visit (from the past 72 hour(s)).   HEENT: poor dentition Cardio: RRR and no murmur Resp: CTA B/L and unlabored GI: BS positive and NT, ND Extremity:  Pulses positive and No Edema Skin:   Intact Neuro: Abnormal Motor 2- Right delt, bi, tri, grip, 3- R HF , KE, ADF, Abnormal FMC Ataxic/ dec FMC, Dysarthric, Aphasic and Other Right homonymous hemianopsia Musc/Skel:  Normal Gen NAD   Assessment/Plan: 1. 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 Physiatrist is providing close team supervision and 24 hour management of active medical problems listed below. Physiatrist and rehab team continue to assess barriers to discharge/monitor patient progress toward functional and medical goals. FIM: Function - Bathing Position: Shower Body parts bathed by patient: Right arm, Right lower leg, Left arm, Left lower leg, Chest, Abdomen, Front perineal area, Buttocks, Right upper leg, Left upper leg, Back Body parts bathed by helper: Back Assist Level: Supervision or verbal cues  Function- Upper Body Dressing/Undressing What is the patient wearing?: Pull over shirt/dress Pull over shirt/dress - Perfomed by patient: Thread/unthread right sleeve, Thread/unthread left sleeve, Put head through opening, Pull shirt over trunk Assist Level: More than reasonable time Function - Lower Body Dressing/Undressing What is the patient wearing?: Pants, Non-skid slipper socks Position: Sitting  EOB Pants- Performed by patient: Thread/unthread right pants leg, Thread/unthread left pants leg, Pull pants up/down Non-skid slipper socks- Performed by patient: Don/doff right sock, Don/doff left sock Assist for footwear: Supervision/touching assist Assist for lower body dressing: Supervision or verbal cues  Function - Toileting Toileting steps completed by patient: Adjust clothing prior to toileting, Performs perineal hygiene, Adjust clothing after toileting Toileting steps completed by helper: Performs perineal hygiene, Adjust clothing after toileting(per Candice Applewhite, NT and Sinclair Grooms, NT) Assist level: Supervision or verbal cues  Function - Archivist transfer assistive device: Grab bar Assist level to toilet: Supervision or verbal cues Assist level from toilet: Supervision or verbal cues  Function - Chair/bed transfer Chair/bed transfer method: Ambulatory Chair/bed transfer assist level: Supervision or verbal cues Chair/bed transfer assistive device: Armrests Chair/bed transfer details: Verbal cues for precautions/safety, Verbal cues for technique  Function - Locomotion: Wheelchair Will patient use wheelchair at discharge?: No Function - Locomotion: Ambulation Assistive device: No device Max distance: 216ft  Assist level: Supervision or verbal cues Assist level: Supervision or verbal cues Assist level: Supervision or verbal cues Assist level: Supervision or verbal cues Assist level: Touching or steadying assistance (Pt > 75%)  Function - Comprehension Comprehension: Auditory Comprehension assist level: Follows basic conversation/direction with no assist  Function - Expression Expression: Verbal Expression assist level: Expresses basic 50 - 74% of the time/requires cueing 25 - 49% of the time. Needs to repeat parts of sentences., Expresses basic 25 - 49% of the time/requires cueing 50 - 75% of the time. Uses single words/gestures.  Function -  Social Interaction Social Interaction assist level: Interacts appropriately 90% of the time - Needs monitoring or encouragement for participation or interaction.  Function - Problem Solving Problem solving  assist level: Solves basic 75 - 89% of the time/requires cueing 10 - 24% of the time, Solves basic 90% of the time/requires cueing < 10% of the time  Function - Memory Memory assist level: Recognizes or recalls 90% of the time/requires cueing < 10% of the time Patient normally able to recall (first 3 days only): Current season, That he or she is in a hospital, Staff names and faces, Location of own room  Medical Problem List and Plan: 1.  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  DVT Prophylaxis/Anticoagulation: Eliquis 3. Pain Management: Tylenol as needed 4. Mood: Aricept 5 mg daily at bedtime 5. Neuropsych: This patient is capable of making decisions on her own behalf. 6. Skin/Wound Care: Routine skin checks 7. Fluids/Electrolytes/Nutrition: Routine I&O's encourage intake, fluid intake 600 mL on 03/17/2017 8. Hypertension. Norvasc 10 mg daily, lisinopril 40 mg daily Vitals:   03/18/17 0334 03/18/17 0900  BP: 100/64 (!) 123/59  Pulse: 78   Resp: 18   Temp: 97.9 F (36.6 C)   SpO2: 100%   controlled 2/15 but running on the low side, will reduce lisinopril to 30 mg 9. Displaced fracture of the proximal phalanx on the right little finger after fall. Status post closed reduction and splinting. Nonweightbearing right hand, buddy tape as well 10. History of CAD with CABG. No chest pain or shortness of breath 11. Atrial fibrillation. Cardiac rate controlled. Continue Eliquis. Patient status post loop recorder 12. Hyperlipidemia. Lipitor 13. History of tobacco use. Counseling,may need  Nicoderm 14.   Hyperkalemia borderline with recheck normal at 4.7 15.  Aphasia following prior stroke SLP will ask family about baseline language deficits  LOS (Days) 4 A FACE TO FACE EVALUATION WAS PERFORMED  Erick Colace 03/18/2017, 2:18 PM

## 2017-03-18 NOTE — Progress Notes (Signed)
Occupational Therapy Session Note  [name]: [id number] Date of Birth: 1950/11/26  Today's Date: 03/18/2017 OT Individual Time: 5176-1607 OT Individual Time Calculation (min): 60 min    Short Term Goals: Week 1:  OT Short Term Goal 1 (Week 1): STG=LTG due to LOS  Skilled Therapeutic Interventions/Progress Updates:    Pt seen for OT session focusing on ADL re-training and functional communication skills. Pt sitting EOB upon arrival set-up with breakfast tray. She was able to correctly name 4/5 breakfast items with increased time.  She ate breakfast with increased time using exclsively L UE to set-up and self feed items, refusing assist from therapist for time management.  She ambulated throughout room with supervision to gather items in prep for shower, correcrly naming 100% of clothing items and their colors with increased time, able to self recognize and self correct mistakes. She bathed in shower alternating sitting and standing, not requiring UE support for balance during dynamic standing tasks. She returned to EOB to dress and R hand bandage changed. Pt standing with distant supervision to pull pants up. Grooming tasks completed standing at sink, min A to use R UE at functional level during set-up. Pt ambulated to therapy day room with supervision and left with hand off to SLP.   Throughout session, pt demonstrated difficulty maintainin R hand NWB and demonstrated difficulty following multimodal cuing for maintaining pre-cautions.   Therapy Documentation Precautions:  Precautions Precautions: Fall Precaution Comments: expressive aphasia Restrictions Weight Bearing Restrictions: Yes RUE Weight Bearing: Non weight bearing(RH pinky) Other Position/Activity Restrictions: R hand Pain:   No/denies pain  See Function Navigator for Current Functional Status.   Therapy/Group: Individual Therapy  Suman Trivedi L 03/18/2017, 6:58 AM

## 2017-03-19 ENCOUNTER — Inpatient Hospital Stay (HOSPITAL_COMMUNITY): Payer: Medicare Other | Admitting: Physical Therapy

## 2017-03-19 DIAGNOSIS — R4701 Aphasia: Secondary | ICD-10-CM

## 2017-03-19 DIAGNOSIS — I10 Essential (primary) hypertension: Secondary | ICD-10-CM

## 2017-03-19 NOTE — Plan of Care (Signed)
  Progressing Consults [facility] STROKE PATIENT EDUCATION Description See Patient Education module for education specifics  03/19/2017 2220 - Progressing by Martina Sinner, RN RH SAFETY RH STG ADHERE TO SAFETY PRECAUTIONS W/ASSISTANCE/DEVICE Description STG Adhere to Safety Precautions With min Assistance/Device.   03/19/2017 2220 - Progressing by Martina Sinner, RN RH COGNITION-NURSING RH STG ANTICIPATES NEEDS/CALLS FOR ASSIST W/ASSIST/CUES Description STG Anticipates Needs/Calls for Assist With mod Assistance/Cues.  03/19/2017 2220 - Progressing by Martina Sinner, RN RH KNOWLEDGE DEFICIT RH STG INCREASE KNOWLEDGE OF HYPERTENSION Description Patient/family able to verbalize sign/symptoms hypo/hypertension of min assistance  03/19/2017 2220 - Progressing by Martina Sinner, RN

## 2017-03-19 NOTE — Progress Notes (Signed)
Physical Therapy Session Note  [name]: [id number] Date of Birth: 24-Aug-1950  Today's Date: 03/19/2017 PT Individual Time: 0805-0900 PT Individual Time Calculation (min): 55 min   Short Term Goals: Week 1:  PT Short Term Goal 1 (Week 1): =LTGs due to ELOS  Skilled Therapeutic Interventions/Progress Updates:   Pt received supine in bed and agreeable to PT. Supine>sit transfer without assist. Sit<>stand from bed without assist from PT.   Gait training throughout rehab unit with supervision assist 163f, 1863f and [id number] cues for direction and safety awareness.   PT instructed pt in dynamic balance training on Wii fit board; penguin slide x3, tilt table x 4, bubble run x 3. Moderate cues and min assist for use of ankle strategy when weight shifting, poor carry over between trials. .  4 square step task R/L and forward back x 15 each, all 4 squares x 10 bother directions. Diagonal x 10 each diection. Min progressing to supervision assist with each new direction with min cues for proper stepping strategy.   [facility] to [facility] and [facility] needs met.         Therapy Documentation Precautions:  Precautions Precautions: Fall Precaution Comments: expressive aphasia Restrictions Weight Bearing Restrictions: Yes RUE Weight Bearing: Non weight bearing(Right hand) Other Position/Activity Restrictions: R hand Vital Signs: Therapy Vitals Temp: 98.1 F (36.7 C) Temp Source: Oral Pulse Rate: 76 Resp: 12 BP: 118/68 Patient Position (if appropriate): Lying Oxygen Therapy SpO2: 100 % O2 Device: Not Delivered Pain: Pain Assessment Pain Assessment: No/denies pain Pain Score: 0-No pain   See Function Navigator for Current Functional Status.   Therapy/Group: Individual Therapy  AusLorie Phenix16/2019, 9:04 AM

## 2017-03-19 NOTE — Plan of Care (Signed)
  Progressing Consults [facility] STROKE PATIENT EDUCATION Description See Patient Education module for education specifics  03/19/2017 [phone] - Progressing by Dani Gobble, RN Nutrition Consult-if indicated 03/19/2017 [phone] - Progressing by Dani Gobble, RN RH SAFETY RH STG ADHERE TO SAFETY PRECAUTIONS W/ASSISTANCE/DEVICE Description STG Adhere to Safety Precautions With min Assistance/Device.   03/19/2017 6834 - Progressing by Dani Gobble, RN RH COGNITION-NURSING RH STG ANTICIPATES NEEDS/CALLS FOR ASSIST W/ASSIST/CUES Description STG Anticipates Needs/Calls for Assist With mod Assistance/Cues.  03/19/2017 1962 - Progressing by Dani Gobble, RN RH KNOWLEDGE DEFICIT RH STG INCREASE KNOWLEDGE OF HYPERTENSION Description Patient/family able to verbalize sign/symptoms hypo/hypertension of min assistance  03/19/2017 0842 - Progressing by Dani Gobble, RN

## 2017-03-19 NOTE — Plan of Care (Signed)
  Progressing Consults RH STROKE PATIENT EDUCATION Description See Patient Education module for education specifics  03/19/2017 1505 - Progressing by Dani Gobble, RN 03/19/2017 [phone] - Progressing by Dani Gobble, RN Nutrition Consult-if indicated 03/19/2017 1505 - Progressing by Dani Gobble, RN 03/19/2017 [phone] - Progressing by Dani Gobble, RN RH SAFETY RH STG ADHERE TO SAFETY PRECAUTIONS W/ASSISTANCE/DEVICE Description STG Adhere to Safety Precautions With min Assistance/Device.   03/19/2017 1505 - Progressing by Dani Gobble, RN 03/19/2017 [phone] - Progressing by Dani Gobble, RN RH COGNITION-NURSING RH STG ANTICIPATES NEEDS/CALLS FOR ASSIST W/ASSIST/CUES Description STG Anticipates Needs/Calls for Assist With mod Assistance/Cues.  03/19/2017 1505 - Progressing by Dani Gobble, RN 03/19/2017 [phone] - Progressing by Dani Gobble, RN RH KNOWLEDGE DEFICIT RH STG INCREASE KNOWLEDGE OF HYPERTENSION Description Patient/family able to verbalize sign/symptoms hypo/hypertension of min assistance  03/19/2017 1505 - Progressing by Dani Gobble, RN 03/19/2017 [phone] - Progressing by Dani Gobble, RN

## 2017-03-19 NOTE — Progress Notes (Signed)
Subjective/Complaints:  Patient in bed without any complaints.  Waiting to get started with therapies  ROS: limited due to language/communication   Objective: Vital Signs: Blood pressure 118/68, pulse 76, temperature 98.1 F (36.7 C), temperature source Oral, resp. rate 12, height 5' (1.524 m), weight 43.1 kg (95 lb 0.3 oz), SpO2 100 %. No results found. No results found for this or any previous visit (from the past 72 hour(s)).   HEENT: poor dentition Cardio: Regular rate no JVD Resp: Unlabored GI: BS positive and NT, ND Extremity:  Pulses positive and No Edema Skin:   Intact Neuro: Abnormal Motor 2- Right delt, bi, tri, grip, 3- R HF , KE, ADF, Abnormal FMC Ataxic/ dec FMC, Dysarthric, Aphasic and Other Right homonymous hemianopsia--no changes in vision Musc/Skel:  Normal Gen NAD Psych: Pleasant and appropriate   Assessment/Plan: 1. 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 Physiatrist is providing close team supervision and 24 hour management of active medical problems listed below. Physiatrist and rehab team continue to assess barriers to discharge/monitor patient progress toward functional and medical goals. FIM: Function - Bathing Position: Shower Body parts bathed by patient: Right arm, Right lower leg, Left arm, Left lower leg, Chest, Abdomen, Front perineal area, Buttocks, Right upper leg, Left upper leg, Back Body parts bathed by helper: Back Assist Level: Supervision or verbal cues  Function- Upper Body Dressing/Undressing What is the patient wearing?: Pull over shirt/dress Pull over shirt/dress - Perfomed by patient: Thread/unthread right sleeve, Thread/unthread left sleeve, Put head through opening, Pull shirt over trunk Assist Level: More than reasonable time Function -  Lower Body Dressing/Undressing What is the patient wearing?: Pants, Non-skid slipper socks Position: Sitting EOB Pants- Performed by patient: Thread/unthread right pants leg, Thread/unthread left pants leg, Pull pants up/down Non-skid slipper socks- Performed by patient: Don/doff right sock, Don/doff left sock Assist for footwear: Supervision/touching assist Assist for lower body dressing: Supervision or verbal cues  Function - Toileting Toileting steps completed by patient: Adjust clothing prior to toileting, Performs perineal hygiene, Adjust clothing after toileting Toileting steps completed by helper: Performs perineal hygiene, Adjust clothing after toileting(per Candice Applewhite, NT and Sinclair Grooms, NT) Assist level: Supervision or verbal cues  Function - Archivist transfer assistive device: Grab bar Assist level to toilet: Supervision or verbal cues Assist level from toilet: Supervision or verbal cues  Function - Chair/bed transfer Chair/bed transfer method: Ambulatory Chair/bed transfer assist level: Supervision or verbal cues Chair/bed transfer assistive device: Armrests Chair/bed transfer details: Verbal cues for precautions/safety, Verbal cues for technique  Function - Locomotion: Wheelchair Will patient use wheelchair at discharge?: No Function - Locomotion: Ambulation Assistive device: No device Max distance: 255ft  Assist level: Supervision or verbal cues Assist level: Supervision or verbal cues Assist level: Supervision or verbal cues Assist level: Supervision or verbal cues Assist level: Touching or steadying assistance (Pt > 75%)  Function - Comprehension Comprehension: Auditory Comprehension assist level: Follows basic conversation/direction with no assist  Function - Expression Expression: Verbal Expression assist level: Expresses basic 50 - 74% of the time/requires cueing 25 - 49% of the time. Needs to repeat parts of sentences., Expresses  basic 25 - 49% of the time/requires cueing 50 - 75% of the time. Uses single words/gestures.  Function - Social Interaction Social Interaction assist level: Interacts appropriately 90% of the time -  Needs monitoring or encouragement for participation or interaction.  Function - Problem Solving Problem solving assist level: Solves basic 75 - 89% of the time/requires cueing 10 - 24% of the time, Solves basic 90% of the time/requires cueing < 10% of the time  Function - Memory Memory assist level: Recognizes or recalls 90% of the time/requires cueing < 10% of the time Patient normally able to recall (first 3 days only): Current season, That he or she is in a hospital, Staff names and faces, Location of own room  Medical Problem List and Plan: 1.  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  DVT Prophylaxis/Anticoagulation: Eliquis 3. Pain Management: Tylenol as needed 4. Mood: Aricept 5 mg daily at bedtime 5. Neuropsych: This patient is capable of making decisions on her own behalf. 6. Skin/Wound Care: Routine skin checks 7. Fluids/Electrolytes/Nutrition: Routine I&O's.  continue to encourage p.o. intake 8. Hypertension. Norvasc 10 mg daily, lisinopril 40 mg daily Vitals:   03/19/17 0535 03/19/17 0735  BP: (!) 109/58 118/68  Pulse: 76   Resp: 12   Temp: 98.1 F (36.7 C)   SpO2: 100%   Lisinopril reduced to 30 mg daily.  Observe for normalization of blood pressure 9. Displaced fracture of the proximal phalanx on the right little finger after fall. Status post closed reduction and splinting. Nonweightbearing right hand, buddy tape as well 10. History of CAD with CABG. No chest pain or shortness of breath 11. Atrial fibrillation. Cardiac rate controlled. Continue Eliquis. Patient status post loop recorder 12. Hyperlipidemia. Lipitor 13. History of tobacco use.  Counseling,may need  Nicoderm 14.  Hyperkalemia borderline with recheck normal at 4.7 15.  Aphasia following prior stroke SLP will ask family about baseline language deficits  LOS (Days) 5 A FACE TO FACE EVALUATION WAS PERFORMED  [name] [name] 03/19/2017, 8:28 AM

## 2017-03-20 ENCOUNTER — Inpatient Hospital Stay (HOSPITAL_COMMUNITY): Payer: Medicare Other | Admitting: Occupational Therapy

## 2017-03-20 ENCOUNTER — Inpatient Hospital Stay (HOSPITAL_COMMUNITY): Payer: Medicare Other

## 2017-03-20 MED ORDER — LISINOPRIL 20 MG PO TABS
20.0000 mg | ORAL_TABLET | Freq: Every day | ORAL | Status: DC
  Administered 2017-03-21: 20 mg via ORAL
  Filled 2017-03-20 (×2): qty 1

## 2017-03-20 NOTE — Plan of Care (Signed)
  Progressing Consults RH STROKE PATIENT EDUCATION Description See Patient Education module for education specifics  03/20/2017 1225 - Progressing by Dani Gobble, RN Nutrition Consult-if indicated 03/20/2017 1225 - Progressing by Dani Gobble, RN RH SAFETY RH STG ADHERE TO SAFETY PRECAUTIONS W/ASSISTANCE/DEVICE Description STG Adhere to Safety Precautions With min Assistance/Device.   03/20/2017 1225 - Progressing by Dani Gobble, RN RH COGNITION-NURSING RH STG ANTICIPATES NEEDS/CALLS FOR ASSIST W/ASSIST/CUES Description STG Anticipates Needs/Calls for Assist With mod Assistance/Cues.  03/20/2017 1225 - Progressing by Dani Gobble, RN RH KNOWLEDGE DEFICIT RH STG INCREASE KNOWLEDGE OF HYPERTENSION Description Patient/family able to verbalize sign/symptoms hypo/hypertension of min assistance  03/20/2017 1225 - Progressing by Dani Gobble, RN

## 2017-03-20 NOTE — Progress Notes (Signed)
Subjective/Complaints:  Patient slept well.  No new issues  ROS: limited due to language/communication    Objective: Vital Signs: Blood pressure 110/62, pulse 60, temperature 98.1 F (36.7 C), temperature source Oral, resp. rate 16, height 5' (1.524 m), weight 43.1 kg (95 lb 0.3 oz), SpO2 100 %. No results found. No results found for this or any previous visit (from the past 72 hour(s)).   HEENT: poor dentition Cardio: RRR without murmur. No JVD  Resp: Unlabored GI: BS positive and NT, ND Extremity:  Pulses positive and No Edema Skin:   Intact Neuro: Abnormal Motor 2- Right delt, bi, tri, grip, 3- R HF , KE, ADF, Abnormal FMC Ataxic/ dec FMC, Dysarthric, Aphasic and Other Right homonymous hemianopsia--stable exam Musc/Skel:  Normal Gen NAD Psych: Pleasant and appropriate   Assessment/Plan: 1. 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 Physiatrist is providing close team supervision and 24 hour management of active medical problems listed below. Physiatrist and rehab team continue to assess barriers to discharge/monitor patient progress toward functional and medical goals. FIM: Function - Bathing Position: Shower Body parts bathed by patient: Right arm, Right lower leg, Left arm, Left lower leg, Chest, Abdomen, Front perineal area, Buttocks, Right upper leg, Left upper leg, Back Body parts bathed by helper: Back Assist Level: Supervision or verbal cues  Function- Upper Body Dressing/Undressing What is the patient wearing?: Button up shirt Pull over shirt/dress - Perfomed by patient: Thread/unthread right sleeve, Thread/unthread left sleeve, Put head through opening, Pull shirt over trunk Button up shirt - Perfomed by patient: Thread/unthread right sleeve, Thread/unthread left sleeve, Pull  shirt around back, Button/unbutton shirt Assist Level: More than reasonable time Function - Lower Body Dressing/Undressing What is the patient wearing?: Pants, Non-skid slipper socks Position: Sitting EOB Pants- Performed by patient: Thread/unthread right pants leg, Thread/unthread left pants leg, Pull pants up/down Non-skid slipper socks- Performed by patient: Don/doff right sock, Don/doff left sock Assist for footwear: Independent Assist for lower body dressing: More than reasonable time  Function - Toileting Toileting steps completed by patient: Adjust clothing prior to toileting, Performs perineal hygiene, Adjust clothing after toileting Toileting steps completed by helper: Performs perineal hygiene, Adjust clothing after toileting(per Candice Applewhite, NT and Sinclair Grooms, NT) Toileting Assistive Devices: Grab bar or rail Assist level: More than reasonable time  Function Programmer, multimedia transfer assistive device: Grab bar Assist level to toilet: No Help, no cues, assistive device, takes more than a reasonable amount of time Assist level from toilet: No Help, no cues, assistive device, takes more than a reasonable amount of time  Function - Chair/bed transfer Chair/bed transfer method: Ambulatory Chair/bed transfer assist level: Supervision or verbal cues Chair/bed transfer assistive device: Armrests Chair/bed transfer details: Verbal cues for precautions/safety, Verbal cues for technique  Function - Locomotion: Wheelchair Will patient use wheelchair at discharge?: No Function - Locomotion: Ambulation Assistive device: No device Max distance: 220 Assist level: Supervision or verbal cues Assist level: Supervision or verbal cues Assist level: Supervision or verbal cues Assist level: Supervision or verbal cues Assist level: Touching or steadying assistance (Pt > 75%)  Function - Comprehension Comprehension: Auditory Comprehension assist level: Follows basic  conversation/direction with no assist  Function - Expression Expression: Verbal Expression assist level: Expresses basic 75 - 89% of the time/requires cueing 10 - 24% of the time. Needs  helper to occlude trach/needs to repeat words.  Function - Social Interaction Social Interaction assist level: Interacts appropriately 90% of the time - Needs monitoring or encouragement for participation or interaction.  Function - Problem Solving Problem solving assist level: Solves basic 75 - 89% of the time/requires cueing 10 - 24% of the time, Solves basic 90% of the time/requires cueing < 10% of the time  Function - Memory Memory assist level: Recognizes or recalls 90% of the time/requires cueing < 10% of the time Patient normally able to recall (first 3 days only): Current season, That he or she is in a hospital, Staff names and faces, Location of own room  Medical Problem List and Plan: 1.  Right side weakness/aphasia secondary to acute left MCA 03/10/17  and history of PCA territory infarct July 2014- Eliquis CIR level PT, OT, SLP progressing   2.  DVT Prophylaxis/Anticoagulation: Eliquis 3. Pain Management: Tylenol as needed 4. Mood: Aricept 5 mg daily at bedtime 5. Neuropsych: This patient is capable of making decisions on her own behalf. 6. Skin/Wound Care: Routine skin checks 7. Fluids/Electrolytes/Nutrition: Routine I&O's.  continue to encourage p.o. intake  8. Hypertension. Norvasc 10 mg daily, lisinopril  daily Vitals:   03/20/17 0526 03/20/17 0735  BP: 95/67 110/62  Pulse: 60   Resp: 16   Temp: 98.1 F (36.7 C)   SpO2: 100%   Lisinopril reduced to 30 mg daily.  Will reduce further to 20 mg daily 9. Displaced fracture of the proximal phalanx on the right little finger after fall. Status post closed reduction and splinting. Nonweightbearing right hand, buddy tape as well 10. History of CAD with CABG. No chest pain or shortness of breath 11. Atrial fibrillation. Cardiac rate  controlled. Continue Eliquis. Patient status post loop recorder 12. Hyperlipidemia. Lipitor 13. History of tobacco use. Counseling,may need  Nicoderm 14.  Hyperkalemia borderline with recheck normal at 4.7 15.  Aphasia following prior stroke SLP will ask family about baseline language deficits  LOS (Days) 6 A FACE TO FACE EVALUATION WAS PERFORMED  [name] [name] T 03/20/2017, 10:15 AM

## 2017-03-20 NOTE — Discharge Summary (Signed)
[name] [name], [name] [name]               ACCOUNT NO.:  [id number]  MEDICAL RECORD NO.:  [id number]  LOCATION:  [id number]                        FACILITY:  [facility]  PHYSICIAN:  Erick Colace, M.D.DATE OF BIRTH:  [birth year]  DATE OF ADMISSION:  03/14/2017 DATE OF DISCHARGE:  03/21/2017                              DISCHARGE SUMMARY   DISCHARGE DIAGNOSES: 1. Left middle cerebral artery infarction as well as history of     posterior cerebral artery territory infarction in July 2014. 2. Deep vein thrombosis prophylaxis with Eliquis. 3. Hypertension. 4. Displaced fracture of the proximal phalanx on the right little     finger after recent fall, status post closed reduction and     splinting. 5. History of coronary artery disease with coronary artery bypass     grafting. 6. Atrial fibrillation. 7. Hyperlipidemia. 8. History of tobacco abuse.  This is a 67 year old right-handed female, history of tobacco abuse, CVA in 2014 with residual right-sided weakness, status post loop recorder placement, maintained on Eliquis, received inpatient rehab services, PAF, CAD with CABG, history of dementia.  Lives with her boyfriend of 20 years who works 12-hour shifts.  Reported to be independent prior to admission.  Presented on March 10, 2017, after acute left MCA infarction.  Noted to have slurred speech, facial droop.  Symptoms appeared to have started on March 09, 2017, when she felt right-sided weakness after a fall.  In the ED, x-rays of the right hand were taken that showed fracture of the right proximal 5th phalanx.  CT of the head unremarkable.  MRI showed left MCA, PCA infarction and patchy small acute ischemic nonhemorrhagic left MCA infarctions.  The patient did not receive tPA.  Neurology consulted for workup.  Echocardiogram with ejection fraction of 60%, no wall motion abnormalities.  Tolerating a regular diet.  Underwent closed reduction and splinting for displaced fracture of the  proximal phalanx on the right little finger, nonweightbearing.  The patient was admitted for comprehensive rehab program.  PAST MEDICAL HISTORY:  See discharge diagnoses.  SOCIAL HISTORY:  She lives with boyfriend, reported to be independent prior to admission.  FUNCTIONAL STATUS UPON ADMISSION TO REHAB SERVICES:  Minimal assist 10 feet, one-person handheld assistance, minimal assist sit to stand, min to mod assist activities of daily living.  PHYSICAL EXAMINATION:  VITAL SIGNS:  Blood pressure 143/73, pulse 72, temperature 98, and respirations 18. GENERAL:  This was an alert female, noted expressive aphasia. HEENT:  EOMs intact. NECK:  Supple.  Nontender.  No JVD. CARDIAC:  Rate controlled. ABDOMEN:  Soft, nontender.  Good bowel sounds. LUNGS:  Clear to auscultation. EXTREMITIES:  Right upper extremity 4/5 to hand grip, limited due to taping; 4/5 proximal to distal, right lower extremity.  REHABILITATION HOSPITAL COURSE:  The patient was admitted to Inpatient Rehab Services with therapies initiated on a 3-hour daily basis consisting of physical therapy, occupational therapy, and rehabilitation nursing.  The following issues were addressed during the patient's rehabilitation stay.  Pertaining to [name]' left MCA infarction, she remained on Eliquis.  Noted also history of PCA territory infarction in 2014.  She would follow up with Neurology Services.  Blood pressure is well controlled, on  Norvasc as well as lisinopril.  Displaced fracture of the proximal phalanx on the right little finger.  She had undergone closed reduction and splinting, nonweightbearing. Follow-up x-rays similar in appearance to prior films. Ambulatory referral is made orthopedic services as outpatient.  No chest pain or shortness of breath, cardiac rate controlled.  She continued on Lipitor for hyperlipidemia.  Noted history of tobacco abuse, receiving counseling in regard to cessation of nicotine  products, maintained on a NicoDerm patch.  Noted documentations of early dementia.  The patient appeared to be at her baseline.  She continued on Aricept.  The patient received weekly collaborative interdisciplinary team conferences to discuss estimated length of stay, family teaching, any barriers to her discharge.  She could ambulate up to 220 feet supervision, minimal cues for direction and safety awareness.  Dynamic standing balance continued to improve.  The patient instructed in locating and naming 3 items in the gift shop, working with her aphasia, followed by Speech Therapy. She could gather her belongings for activities of daily living and homemaking, dressing, grooming, and hygiene.  She ambulated throughout her room with supervision, gather her items in preparation for her activities of daily living.  She was discharged to home.  DISCHARGE MEDICATIONS: 1. Norvasc 10 mg p.o. daily. 2. Eliquis 5 mg p.o. b.i.d. 3. Lipitor 80 mg p.o. daily. 4. Vitamin D 1000 units p.o. daily. 5. Aricept 5 mg p.o. at bedtime. 6. Lisinopril 20 mg p.o. daily.  DIET:  Her diet was regular.  FOLLOWUP:  She would [facility]; Dr. Delia Heady, call for appointment in 6 weeks; Dr. Neita Goodnight, Medical Management.  The patient would continue to be nonweightbearing to the right hand. Ambulatory referral made to outpatient orthopedic services to address proximal phalanx of right little finger  SPECIAL INSTRUCTIONS:  No driving.  No smoking.     [name] [name], P.A.   ______________________________ Erick Colace, M.D.    DA/MEDQ  D:  03/20/2017  T:  03/20/2017  Job:  [id number]  cc:   Erick Colace, M.D. Dr. Magdalene Patricia

## 2017-03-20 NOTE — Progress Notes (Signed)
Physical Therapy Discharge Summary  [name]: [id number] Date of Birth: [birth year]  Today's Date: 03/20/2017 PT Individual Time: 1000-1100 PT Individual Time Calculation (min): 57 min    Patient has met 6 of 6 long term goals due to improved activity tolerance, improved balance, improved postural control, increased strength, ability to compensate for deficits and improved awareness.  Patient to discharge at an ambulatory level Modified Independent.   All Goals Met.   Recommendation:  Patient will benefit from ongoing skilled PT services in home health setting to continue to advance safe functional mobility, address ongoing impairments in balance, weakness, and minimize fall risk.  Equipment: No equipment provided  Reasons for discharge: treatment goals met  Patient/family agrees with progress made and goals achieved: Yes   PT Treatment Interventions: Pt supine in bed upon PT arrival, agreeable to therapy tx and denies pain. Pt transferred from supine>sitting Mod I. Pt ambulated from room>gym Mod I without AD, >200 ft. Discharge summary completed with focus on functional transfers, stair navigation, gait and high level balance. Pt participated in balance testing: berg balance scale and TUG as detailed below, discussed results with the patient. Pt ambulated within the unit this session >200 ft Mod I. Pt ascended/descended 12 steps with single handrail, reciprocal pattern when ascended and step to pattern when descended. Pt performed car transfer Mod I and furniture transfers Mod I via ambulation without AD. Pt worked on dynamic standing balance to perform sidestepping and backwards stepping with agility ladder, toe taps on colored cones, and standing on foam while throwing horseshoes. Pt used nustep x 6 minutes of workload 4 for LE strengthening, LEs only. Pt ambulated back to room Mod I and left supine in bed with needs in reach, made Mod I in the room.      PT Discharge Precautions/Restrictions Precautions Precautions: Fall Precaution Comments: expressive aphasia; hx R hemi Restrictions Weight Bearing Restrictions: Yes RUE Weight Bearing: Non weight bearing Other Position/Activity Restrictions: R hand Vital Signs Therapy Vitals BP: 110/62 Pain Pain Assessment Pain Assessment: No/denies pain Pain Score: 0-No pain Vision/Perception  Vision - Assessment Additional Comments: Pt reports no change in vision since most recent CVA Perception Perception: Within Functional Limits Praxis Praxis: Intact  Cognition Overall Cognitive Status: Difficult to assess(difficult to assess secondary to aphasia, [facility] for mobility tasks) Arousal/Alertness: Awake/alert Orientation Level: Oriented X4 Attention: Selective Selective Attention: Appears intact Selective Attention Impairment: Verbal basic;Functional basic Awareness: Impaired Awareness Impairment: Emergent impairment Problem Solving: Impaired Problem Solving Impairment: Verbal complex;Functional complex(Expressive deficits) Comments: Impulsive with decreaed safety awareness/ awareness of deficits Sensation Sensation Light Touch: Impaired by gross assessment Additional Comments: impaired light touch R side compared to L Coordination Gross Motor Movements are Fluid and Coordinated: No Fine Motor Movements are Fluid and Coordinated: No Coordination and Movement Description: coordination impaired secondary to mild R hemiplegia UE>LE Finger Nose Finger Test: Decreased speed and accuracy R UE Heel Shin Test: impaired R side Motor  Motor Motor: Hemiplegia Motor - Discharge Observations: mild right hemiparesis  Trunk/Postural Assessment  Cervical Assessment Cervical Assessment: Within Functional Limits Thoracic Assessment Thoracic Assessment: Within Functional Limits Lumbar Assessment Lumbar Assessment: Within Functional Limits Postural Control Postural Control: Within Functional  Limits  Balance Balance Balance Assessed: Yes Standardized Balance Assessment Standardized Balance Assessment: Berg Balance Test Berg Balance Test Sit to Stand: Able to stand without using hands and stabilize independently Standing Unsupported: Able to stand safely 2 minutes Sitting with Back Unsupported but Feet Supported on Floor  or Stool: Able to sit safely and securely 2 minutes Stand to Sit: Sits safely with minimal use of hands Transfers: Able to transfer safely, minor use of hands Standing Unsupported with Eyes Closed: Able to stand 10 seconds safely Standing Ubsupported with Feet Together: Able to place feet together independently and stand for 1 minute with supervision From Standing, Reach Forward with Outstretched Arm: Can reach confidently >25 cm (10") From Standing Position, Pick up Object from Floor: Able to pick up shoe safely and easily From Standing Position, Turn to Look Behind Over each Shoulder: Looks behind one side only/other side shows less weight shift Turn 360 Degrees: Able to turn 360 degrees safely in 4 seconds or less Standing Unsupported, Alternately Place Feet on Step/Stool: Able to stand independently and safely and complete 8 steps in 20 seconds Standing Unsupported, One Foot in Front: Able to plae foot ahead of the other independently and hold 30 seconds Standing on One Leg: Able to lift leg independently and hold 5-10 seconds Total Score: 52 Timed Up and Go Test TUG: Normal TUG Normal TUG (seconds): 9.75 Dynamic Sitting Balance Dynamic Sitting - Balance Support: During functional activity;No upper extremity supported Dynamic Sitting - Level of Assistance: 6: Modified independent (Device/Increase time) Static Standing Balance Static Standing - Balance Support: During functional activity Static Standing - Level of Assistance: 6: Modified independent (Device/Increase time) Dynamic Standing Balance Dynamic Standing - Balance Support: During functional  activity;No upper extremity supported Dynamic Standing - Level of Assistance: 6: Modified independent (Device/Increase time) Dynamic Standing - Comments: During standing bathing/dressing tasks Extremity Assessment  RUE Assessment RUE Assessment: Exceptions to [facility] RUE AROM (degrees) Overall AROM Right Upper Extremity: Deficits;Due to premorbid status(Hx R hemi; able to acheive full ROM in shoulder and elbow though remains in flexed position at rest. Decreased finger opposition, Strength not formally assessed due to [place] pre-cautions) LUE Assessment LUE Assessment: Within Functional Limits RLE Assessment RLE Assessment: Within Functional Limits LLE Assessment LLE Assessment: Within Functional Limits   See Function Navigator for Current Functional Status.  Netta Corrigan, PT, DPT 03/20/2017, 10:20 AM

## 2017-03-20 NOTE — Discharge Summary (Signed)
Discharge summary job # [id number]

## 2017-03-20 NOTE — Progress Notes (Signed)
Occupational Therapy Discharge Summary  [name]: [id number] Date of Birth: Dec 06, 1950  Patient has met 9 of 9 long term goals due to improved activity tolerance, improved balance, postural control, ability to compensate for deficits, improved attention, improved awareness and improved coordination.  Patient to discharge at overall Mod I- Supervision level due to pt's communication deficits.  Formal family/ caregiver education was not completed, however, CSW made pt's family aware of her need for 24 hour supervision due to communication deficits. They voice willing and ableness to provide this level of care at d/c.    Recommendation:  Patient will benefit from ongoing skilled OT services in home health setting to continue to advance functional skills in the area of BADL and iADL.  Equipment: No equipment provided  Reasons for discharge: treatment goals met and discharge from hospital  Patient/family agrees with progress made and goals achieved: Yes  OT Discharge Precautions/Restrictions  Precautions Precautions: Fall Precaution Comments: expressive aphasia; hx R hemi Restrictions Weight Bearing Restrictions: Yes RUE Weight Bearing: Non weight bearing(Right hand) Other Position/Activity Restrictions: R hand Vision Baseline Vision/History: (Pt with hx of R field cut following previous CVA in 2014) Patient Visual Report: No change from baseline Vision Assessment?: No apparent visual deficits Additional Comments: Pt reports no change in vision since most recent CVA Perception  Perception: Within Functional Limits Praxis Praxis: Intact Cognition Overall Cognitive Status: Difficult to assess(Difficult to assess due to aphasia, appears [facility] for basic ADLs) Arousal/Alertness: Awake/alert Orientation Level: Oriented X4 Selective Attention: Appears intact Selective Attention Impairment: Verbal basic;Functional basic Awareness: Impaired Awareness  Impairment: Emergent impairment Problem Solving: Impaired Problem Solving Impairment: Verbal complex;Functional complex(Expressive deficits) Comments: Impulsive with decreaed safety awareness/ awareness of deficits Sensation Coordination Gross Motor Movements are Fluid and Coordinated: No Coordination and Movement Description: mild R hemiplegia UE>LE, pt reports return to baseline physically Finger Nose Finger Test: Decreased speed and accuracy R UE Motor  Motor Motor: Hemiplegia Motor - Discharge Observations: Mild R hemiplegia which pt reports has returned to baseline  Trunk/Postural Assessment  Cervical Assessment Cervical Assessment: Within Functional Limits Thoracic Assessment Thoracic Assessment: Within Functional Limits Lumbar Assessment Lumbar Assessment: Within Functional Limits Postural Control Postural Control: Within Functional Limits  Balance Balance Balance Assessed: Yes Dynamic Sitting Balance Dynamic Sitting - Balance Support: During functional activity;No upper extremity supported Dynamic Sitting - Level of Assistance: 6: Modified independent (Device/Increase time) Static Standing Balance Static Standing - Balance Support: During functional activity Static Standing - Level of Assistance: 6: Modified independent (Device/Increase time) Dynamic Standing Balance Dynamic Standing - Balance Support: During functional activity;No upper extremity supported Dynamic Standing - Level of Assistance: 6: Modified independent (Device/Increase time) Dynamic Standing - Comments: During standing bathing/dressing tasks Extremity/Trunk Assessment RUE Assessment RUE Assessment: Exceptions to [facility] RUE AROM (degrees) Overall AROM Right Upper Extremity: Deficits;Due to premorbid status(Hx R hemi; able to acheive full ROM in shoulder and elbow though remains in flexed position at rest. Decreased finger opposition, Strength not formally assessed due to [place] pre-cautions) LUE  Assessment LUE Assessment: Within Functional Limits   See Function Navigator for Current Functional Status.  Jaclene Bartelt L 03/20/2017, 9:01 AM

## 2017-03-20 NOTE — Progress Notes (Signed)
Occupational Therapy Session Note  [name]: [id number] Date of Birth: [birth year]  Today's Date: 03/20/2017 OT Individual Time: 4163-8453 and 1315 1403 OT Individual Time Calculation (min): 75 min and 48 min   Short Term Goals: Week 1:  OT Short Term Goal 1 (Week 1): STG=LTG due to LOS  Skilled Therapeutic Interventions/Progress Updates:    Session One: Pt seen for OT ADL bathing/dressing session. Pt sitting EOB upon arrival eating breakfast, agreeable to tx session. She was able to name 4/5 items on breakfast tray with increased time. She ambulated throughout session at supervision-mod I level to gather clothing items in prep for shower. She bathed standing in shower with distant supervision in simulation of home environment.  She completed grooming tasks standing and sink mod I before turning to sit EOB to complete dressing task. Pt demonstrates good compensatory strategies during functional tasks for limited use of R UE that she used PTA. She dressed sit <> stand from EOB mod I. Pt ambulated thorughout unit with distant supervision. She fixed herself cup of coffee with VCs for use of machine. Completed table top game, required to name number and colors on game pieces. Able to name numbers 1-4 mod I and required min cuing for naming and identifying colors. She ambulated back to room at end of session, left seated EOB with all needs in reach and bed alarm on.   Session Two: Pt seen for OT session focusing on functional transfers, ambulation and word finding in functional environment. Pt exiting bathroom upon arrival, made Mod I in room in prep for d/c home tomorrow. Pt agreeable to tx session. She ambulated throughout session at distant supervision-mod I level without AD. In ADL apartment, pt completed simulated tub/shower transfer with supervision, using grab bars as appropriate as she has these in home set-up. She then ambulated off unit to hospital gift  store. Pt required to name common objects which were for sale in store. Pt required supervision assist overall for naming common objects, able to self initiate compensatory strategieis and slow down in order to come up with word.  She then worked on reading labels to name common candy bars requiring mod cuing to initate words when readying. Pt returned to room at end of session, left seated EOB with all needs in reach. Pt voicing feeling ready and excited for planned d/c home tomorrow.   Therapy Documentation Precautions:  Precautions Precautions: Fall Precaution Comments: expressive aphasia Restrictions Weight Bearing Restrictions: Yes RUE Weight Bearing: Non weight bearing(to right hand) Other Position/Activity Restrictions: R hand Pain:   No/ denies pain  See Function Navigator for Current Functional Status.   Therapy/Group: Individual Therapy  [name] L 03/20/2017, 6:41 AM

## 2017-03-21 ENCOUNTER — Inpatient Hospital Stay (HOSPITAL_COMMUNITY): Payer: Medicare Other

## 2017-03-21 MED ORDER — LISINOPRIL 20 MG PO TABS: 20.0000 mg | ORAL_TABLET | Freq: Every day | ORAL | 1 refills | Status: DC

## 2017-03-21 MED ORDER — APIXABAN 5 MG PO TABS: 5.0000 mg | ORAL_TABLET | Freq: Two times a day (BID) | ORAL | 3 refills | Status: DC

## 2017-03-21 MED ORDER — ATORVASTATIN CALCIUM 80 MG PO TABS: 80.0000 mg | ORAL_TABLET | Freq: Every day | ORAL | 1 refills | Status: DC

## 2017-03-21 MED ORDER — CHOLECALCIFEROL 25 MCG (1000 UT) PO TABS: 1000.0000 [IU] | ORAL_TABLET | Freq: Every day | ORAL | 1 refills | Status: DC

## 2017-03-21 MED ORDER — DONEPEZIL HCL 5 MG PO TABS: 5.0000 mg | ORAL_TABLET | Freq: Every day | ORAL | 5 refills | Status: DC

## 2017-03-21 MED ORDER — AMLODIPINE BESYLATE 10 MG PO TABS: 10.0000 mg | ORAL_TABLET | Freq: Every day | ORAL | 1 refills | Status: DC

## 2017-03-21 NOTE — Progress Notes (Signed)
Social Work  Discharge Note  The overall goal for the admission was met for:   Discharge location: [place]  Length of Stay: Yes-7 DAYS  Discharge activity level: Yes-SUPERVISION-MOD/I LEVEL  Home/community participation: Yes  Services provided included: MD, RD, PT, OT, SLP, RN, CM, Pharmacy and SW  Financial Services: Medicaid  Follow-up services arranged: Home Health: [facility]-PT,OT,SP,AIDE, DME: [facility]- NO NEEDS and Patient/Family request agency HH: HAD BEFORE, DME: NO PREF  Comments (or additional information):PT DID WELL NEEDS SUPERVISION DUE TO SPEECH/LANGUAGE ISSUES. FAMILY AWARE OF HER NEEDS.PCS REFERRAL MADE  Patient/Family verbalized understanding of follow-up arrangements: Yes  Individual responsible for coordination of the follow-up plan: RICHARD-BOYFRIEND & ANNETTE-SISTER  Confirmed correct DME delivered: Elease Hashimoto 03/21/2017    Elease Hashimoto

## 2017-03-21 NOTE — Progress Notes (Signed)
Patient and family discussed all the discharge instructions with [name], PA before they left the hospital.

## 2017-03-21 NOTE — Progress Notes (Addendum)
Subjective/Complaints:  Pt aware of D/C  ROS: limited due to language/communication    Objective: Vital Signs: Blood pressure 123/65, pulse 69, temperature 98.9 F (37.2 C), temperature source Oral, resp. rate 16, height 5' (1.524 m), weight 43.1 kg (95 lb 0.3 oz), SpO2 100 %. No results found. No results found for this or any previous visit (from the past 72 hour(s)).   HEENT: poor dentition Cardio: RRR without murmur. No JVD  Resp: Unlabored GI: BS positive and NT, ND Extremity:  Pulses positive and No Edema Skin:   Intact Neuro: Abnormal Motor 2- Right delt, bi, tri, grip, 3- R HF , KE, ADF, Abnormal FMC Ataxic/ dec FMC, Dysarthric, Aphasic and Other Right homonymous hemianopsia--stable exam Musc/Skel:  Normal Gen NAD Psych: Pleasant and appropriate   Assessment/Plan: 1. 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  Stable for D/C today F/u PCP in 3-4 weeks F/u PM&R 2 weeks Ortho f/u 1 wk See D/C summary See D/C instructions FIM: Function - Bathing Position: Shower Body parts bathed by patient: Right arm, Right lower leg, Left arm, Left lower leg, Chest, Abdomen, Front perineal area, Buttocks, Right upper leg, Left upper leg, Back Body parts bathed by helper: Back Assist Level: Supervision or verbal cues  Function- Upper Body Dressing/Undressing What is the patient wearing?: Button up shirt Pull over shirt/dress - Perfomed by patient: Thread/unthread right sleeve, Thread/unthread left sleeve, Put head through opening, Pull shirt over trunk Button up shirt - Perfomed by patient: Thread/unthread right sleeve, Thread/unthread left sleeve, Pull shirt around back, Button/unbutton shirt Assist Level: More than reasonable time Function - Lower Body Dressing/Undressing What is the patient wearing?: Pants, Non-skid slipper socks Position: Sitting EOB Pants- Performed by patient:  Thread/unthread right pants leg, Thread/unthread left pants leg, Pull pants up/down Non-skid slipper socks- Performed by patient: Don/doff right sock, Don/doff left sock Assist for footwear: Independent Assist for lower body dressing: More than reasonable time  Function - Toileting Toileting steps completed by patient: Adjust clothing prior to toileting, Performs perineal hygiene, Adjust clothing after toileting Toileting steps completed by helper: Performs perineal hygiene, Adjust clothing after toileting(per Candice Applewhite, NT and Sinclair Grooms, NT) Toileting Assistive Devices: Grab bar or rail Assist level: More than reasonable time  Function Programmer, multimedia transfer assistive device: Grab bar Assist level to toilet: Supervision or verbal cues Assist level from toilet: Supervision or verbal cues  Function - Chair/bed transfer Chair/bed transfer method: Ambulatory Chair/bed transfer assist level: No Help, no cues, assistive device, takes more than a reasonable amount of time Chair/bed transfer assistive device: Armrests Chair/bed transfer details: Verbal cues for precautions/safety, Verbal cues for technique  Function - Locomotion: Wheelchair Will patient use wheelchair at discharge?: No Function - Locomotion: Ambulation Assistive device: No device Max distance: >200 ft Assist level: No help, No cues, assistive device, takes more than a reasonable amount of time Assist level: No help, No cues, assistive device, takes more than a reasonable amount of time Assist level: No help, No cues, assistive device, takes more than a reasonable amount of time Assist level: No help, No cues, assistive device, takes more than a reasonable amount of time Assist level: Supervision or verbal cues  Function - Comprehension Comprehension: Auditory Comprehension assist level: Follows basic conversation/direction with no assist  Function - Expression Expression: Verbal Expression  assist level: Expresses basic 50 - 74% of the time/requires cueing 25 - 49% of the time.  Needs to repeat parts of sentences.  Function - Social Interaction Social Interaction assist level: Interacts appropriately 90% of the time - Needs monitoring or encouragement for participation or interaction.  Function - Problem Solving Problem solving assist level: Solves basic 75 - 89% of the time/requires cueing 10 - 24% of the time, Solves basic 90% of the time/requires cueing < 10% of the time  Function - Memory Memory assist level: Recognizes or recalls 90% of the time/requires cueing < 10% of the time Patient normally able to recall (first 3 days only): Current season, That he or she is in a hospital, Staff names and faces, Location of own room  Medical Problem List and Plan: 1.  Right side weakness/aphasia secondary to acute left MCA 03/10/17  and history of PCA territory infarct July 2014- Eliquis CIR level PT, OT, SLP stable for D/C 2.  DVT Prophylaxis/Anticoagulation: Eliquis 3. Pain Management: Tylenol as needed 4. Mood: Aricept 5 mg daily at bedtime 5. Neuropsych: This patient is capable of making decisions on her own behalf. 6. Skin/Wound Care: Routine skin checks 7. Fluids/Electrolytes/Nutrition: Routine I&O's.  continue to encourage p.o. intake  8. Hypertension. Norvasc 10 mg daily, lisinopril  daily Vitals:   03/20/17 1413 03/21/17 0529  BP: 122/66 123/65  Pulse: 82 69  Resp: 17 16  Temp: 97.9 F (36.6 C) 98.9 F (37.2 C)  SpO2: 100% 100%  Lisinopril  20 mg daily, controlled 2/18 9. Displaced fracture of the proximal phalanx on the right little finger after fall. Status post closed reduction and splinting. Nonweightbearing right hand, buddy tape as well Pt is weightbearing despite education will re xray prior to D/C 10. History of CAD with CABG. No chest pain or shortness of breath 11. Atrial fibrillation. Cardiac rate controlled. Continue Eliquis. Patient status post loop  recorder 12. Hyperlipidemia. Lipitor 13. History of tobacco use. Counseling,may need  Nicoderm 14.  Hyperkalemia borderline with recheck normal at 4.7 15.  Aphasia following prior stroke SLP will ask family about baseline language deficits  LOS (Days) 7 A FACE TO FACE EVALUATION WAS PERFORMED  Erick Colace 03/21/2017, 8:21 AM

## 2017-03-21 NOTE — Progress Notes (Signed)
Excited about going home today. 4th and 5th fingers splinted on right hand. Patient without complaint of pain.[name] [name] A

## 2017-03-21 NOTE — Progress Notes (Signed)
Speech Language Pathology Discharge Summary  [name]: [id number] Date of Birth: 22-Jan-1951  Today's Date: 03/21/2017 SLP Individual Time:  -  Late Note      Skilled Therapeutic Interventions:  N/A    Patient has met 4 of 4 long term goals.  Patient to discharge at overall Mod;Supervision;Modified Independent level.  Reasons goals not met:     Clinical Impression/Discharge Summary:   Pt demonstrated great progress meeting 4 out 4 goals, discharging at Mod I for least restrictive diet regular and thin, Supervision A for receptive language and Moderate A for expressive language. Pt is able to name common objects, express wants/needs at word level, however phrase level requires Moderate verbal cues. Pt is able to self-monitor verbal/ function errors and correct with with extra time and supervision A. Pt is able to match object and word with supervision A. Pt is unable to decode letter/word or write letter/world level. Pt would continue to benefit from skilled ST services in order to maximize functional independence and reduce burden of care requiring 24/hour supervision and follow up outpatient /home health services.   Care Partner:  Caregiver Able to Provide Assistance: Yes  Type of Caregiver Assistance: Cognitive;Physical  Recommendation:  Outpatient SLP;Home Health SLP;24 hour supervision/assistance  Rationale for SLP Follow Up: Maximize functional communication;Maximize cognitive function and independence;Reduce caregiver burden   Equipment:   N/A   Reasons for discharge: Discharged from hospital       Function:  Eating Eating                 Cognition Comprehension    Expression      Social Interaction    Problem Solving    Memory     [name]  [facility] 03/21/2017, 3:02 PM

## 2017-03-22 ENCOUNTER — Telehealth: Payer: Self-pay | Admitting: *Deleted

## 2017-03-22 NOTE — Telephone Encounter (Signed)
Transitional Care call-I spoke with [name] [name] and her mother [name] [name]    1. Are you/is patient experiencing any problems since coming home? Are there any questions regarding any aspect of care? NO 2. Are there any questions regarding medications administration/dosing? Are meds being taken as prescribed? Patient should review meds with caller to confirm They have confirmed all meds but the Eliquis. I have put a call out to [facility]) to send someone to check these. I called and verified her pharmacy to be [facility] and they filled her Aricept and Lisinopril yesterday.  Her mother verified she had the atorvastatin and amlodipine. 3.  4. Have there been any falls? NO 5. Has Home Health been to the house and/or have they contacted you? If not, have you tried to contact them? Can we help you contact them? NO [name] is staying with her mother and they may not have the information. I have called and made contact with [facility] in [place] and given them the address and phone number where she is staying. PT was supposed to see today but may not have had the information. [name] will get the information to him and make sure they check meds. 6. Are bowels and bladder emptying properly? Are there any unexpected incontinence issues? If applicable, is patient following bowel/bladder programs? NO 7. Any fevers, problems with breathing, unexpected pain?NO 8. Are there any skin problems or new areas of breakdown?NO 9. Has the patient/family member arranged specialty MD follow up (ie cardiology/neurology/renal/surgical/etc)?  Can we help arrange? Appt given to see [name]. I will have packet mailed to mothers address: [address] [place], [place] [address] [phone].  10. Does the patient need any other services or support that we can help arrange?HH (done) 11. Are caregivers following through as expected in assisting the patient?Yes 12. Has the patient quit smoking, drinking alcohol, or using drugs as recommended?  N/A  Appointment Friday 04/01/17 @ 2:00 arrive by 1:30 to see Dr Wynn Banker. [address]

## 2017-03-23 ENCOUNTER — Telehealth: Payer: Self-pay

## 2017-03-23 DIAGNOSIS — I63512 Cerebral infarction due to unspecified occlusion or stenosis of left middle cerebral artery: Secondary | ICD-10-CM

## 2017-03-23 NOTE — Telephone Encounter (Signed)
[name] [name] PT [facility] called requesting for this patient to be advanced to out-patient PT due to improved movement and not being just "home bound" does still have problems with RUE.  Patient currently staying with mother in [place].

## 2017-04-01 ENCOUNTER — Encounter: Payer: Medicare Other | Admitting: Physical Medicine & Rehabilitation

## 2017-04-01 ENCOUNTER — Encounter: Payer: Medicare Other | Attending: Physical Medicine & Rehabilitation

## 2017-04-01 DIAGNOSIS — I69351 Hemiplegia and hemiparesis following cerebral infarction affecting right dominant side: Secondary | ICD-10-CM | POA: Insufficient documentation

## 2017-04-01 DIAGNOSIS — I69320 Aphasia following cerebral infarction: Secondary | ICD-10-CM | POA: Insufficient documentation

## 2017-04-25 ENCOUNTER — Ambulatory Visit (HOSPITAL_BASED_OUTPATIENT_CLINIC_OR_DEPARTMENT_OTHER): Payer: Medicare Other | Admitting: Physical Medicine & Rehabilitation

## 2017-04-25 ENCOUNTER — Encounter: Payer: Self-pay | Admitting: Physical Medicine & Rehabilitation

## 2017-04-25 VITALS — BP 140/81 | HR 65

## 2017-04-25 DIAGNOSIS — I63512 Cerebral infarction due to unspecified occlusion or stenosis of left middle cerebral artery: Secondary | ICD-10-CM | POA: Diagnosis not present

## 2017-04-25 DIAGNOSIS — I69320 Aphasia following cerebral infarction: Secondary | ICD-10-CM

## 2017-04-25 DIAGNOSIS — I482 Chronic atrial fibrillation, unspecified: Secondary | ICD-10-CM

## 2017-04-25 DIAGNOSIS — I69351 Hemiplegia and hemiparesis following cerebral infarction affecting right dominant side: Secondary | ICD-10-CM | POA: Diagnosis present

## 2017-04-25 MED ORDER — APIXABAN 2.5 MG PO TABS: 5.0000 mg | ORAL_TABLET | Freq: Two times a day (BID) | ORAL | Status: DC

## 2017-04-25 NOTE — Progress Notes (Signed)
Subjective:    Patient ID: [name] [name], female    DOB: 08-Jan-1951, 67 y.o.   MRN: [id number] 67 year old right-handed female, history of tobacco abuse, CVA in 2014 with residual right-sided weakness, status post loop recorder placement, maintained on Eliquis, received inpatient rehab services, PAF, CAD with CABG, history of dementia.  Lives with her boyfriend of 20 years who works 12-hour shifts.  Reported to be independent prior to admission.  Presented on March 10, 2017, after acute left MCA infarction.  Noted to have slurred speech, facial droop.  Symptoms appeared to have started on March 09, 2017, when she felt right-sided weakness after a fall.  In the ED, x-rays of the right hand were taken that showed fracture of the right proximal 5th phalanx.  CT of the head unremarkable.  MRI showed left MCA, PCA infarction and patchy small acute ischemic nonhemorrhagic left MCA infarctions.  The patient did not receive tPA.  Neurology consulted for workup.  Echocardiogram with ejection fraction of 60%, no wall motion abnormalities.  Tolerating a regular diet.  Underwent closed reduction and splinting for displaced fracture of the proximal phalanx on the right little finger, nonweightbearing.     HPI 67 year old female with history of prior CVA in 2014, left PCA infarct sustained left MCA distribution infarct posterior branch on March 10, 2017. Patient was discharged from inpatient rehabilitation on February 18 and has been at home since that time.  She has been taking her medications with the exception of Eliquis which she did not fill.  She cannot tell me why that particular medication was not filled.  She is thinks she may not have received the prescription. She has  followed up with her primary physician although she cannot tell me his name.  Patient does have a moderate a aphasia affecting her speech. No other new medical issues in the interval time. She is not requiring any  assistance for dressing or bathing she ambulates without assistive device.  She is not able to ambulate outside the home.  She has limited transportation. Home health therapy has not come by yet. Pain Inventory Average Pain 5 Pain Right Now 0 My pain is dull  In the last 24 hours, has pain interfered with the following? General activity 3 Relation with others 3 Enjoyment of life 3 What TIME of day is your pain at its worst? night Sleep (in general) Fair  Pain is worse with: walking, bending, sitting and standing Pain improves with: rest, therapy/exercise and medication Relief from Meds: 9  Mobility ability to climb steps?  yes do you drive?  no  Function not employed: date last employed . I need assistance with the following:  meal prep, household duties and shopping  Neuro/Psych bowel control problems trouble walking  Prior Studies Any changes since last visit?  no  Physicians involved in your care Any changes since last visit?  no   Family History  Problem Relation Age of Onset  . Hypertension Mother   . Stroke Brother        Died at age 19 secondary to MI  . CAD Brother    Social History   Socioeconomic History  . Marital status: Divorced    Spouse name: Not on file  . Number of children: 1  . Years of education: college  . Highest education level: Not on file  Occupational History  . Occupation: retired  Engineer, production  . Financial resource strain: Not on file  . Food insecurity:  Worry: Not on file    Inability: Not on file  . Transportation needs:    Medical: Not on file    Non-medical: Not on file  Tobacco Use  . Smoking status: Current Every Day Smoker    Packs/day: 0.50    Years: 33.00    Pack years: 16.50    Types: Cigarettes    Start date: 08/25/2012  . Smokeless tobacco: Never Used  Substance and Sexual Activity  . Alcohol use: No  . Drug use: No  . Sexual activity: Never  Lifestyle  . Physical activity:    Days per week: Not on  file    Minutes per session: Not on file  . Stress: Not on file  Relationships  . Social connections:    Talks on phone: Not on file    Gets together: Not on file    Attends religious service: Not on file    Active member of club or organization: Not on file    Attends meetings of clubs or organizations: Not on file    Relationship status: Not on file  Other Topics Concern  . Not on file  Social History Narrative  . Not on file   Past Surgical History:  Procedure Laterality Date  . CARDIAC CATHETERIZATION    . CESAREAN SECTION  X 1  . CORONARY ARTERY BYPASS GRAFT N/A 07/25/2012   Procedure: CORONARY ARTERY BYPASS GRAFTING (CABG) times five with Left  Endoscopic Saphenous Vein Harvest and Left Internal  Mammary ;  Surgeon: Loreli Slot, MD;  Location: [facility] OR;  Service: Open Heart Surgery;  Laterality: N/A;  . DILATION AND CURETTAGE OF UTERUS    . 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 LEFT HEART CATHETERIZATION WITH CORONARY ANGIOGRAM N/A 07/21/2012   Procedure: LEFT HEART CATHETERIZATION WITH CORONARY ANGIOGRAM;  Surgeon: [name], MD;  Location: [facility] CATH LAB;  Service: Cardiovascular;  Laterality: N/A;  . LOOP RECORDER IMPLANT  08-15-2012   Medtronic LinQ implanted by Dr Johney Frame for cryptogenic stroke  . TEE WITHOUT CARDIOVERSION N/A 08/15/2012   Procedure: TRANSESOPHAGEAL ECHOCARDIOGRAM (TEE);  Surgeon: Wendall Stade, MD;  Location: [facility] ENDOSCOPY;  Service: Cardiovascular;  Laterality: N/A;  . TUBAL LIGATION     Past Medical History:  Diagnosis Date  . Acute ischemic left MCA stroke (HCC) 03/11/2017  . Hyperlipidemia LDL goal < 70 07/24/2012  . Hypertension   . 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 07/25/12 07/28/2012   POSTOPERATIVE DIAGNOSIS: Severe 3-vessel coronary disease, status post  myocardial infarction.  PROCEDURE: Median sternotomy, extracorporeal circulation, coronary  artery bypass grafting x5 (left internal mammary artery to LAD,   sequential saphenous vein graft to obtuse marginal 1, obtuse marginal 2,  and posterior descending with Y graft to 1st diagonal), endoscopic vein  harvest, left thigh.    . Seizures (HCC)    "years ago" (03/11/2017)  . STEMI (ST elevation myocardial infarction), secondary to disease of RCA, with disease of LAD and diag. branch and chronic non dominant LCX occlusion for CABG  07/21/2012  . Stroke [facility]) ?08/2012   "right sided weakness; speech isssues since" (03/11/2017)  . Tobacco abuse 07/21/2012   There were no vitals taken for this visit.  Opioid Risk Score:   Fall Risk Score:  `1  Depression screen PHQ 2/9  No flowsheet data found.   Review of Systems  Constitutional: Negative.   HENT: Negative.   Eyes: Negative.   Respiratory: Negative.   Cardiovascular: Negative.  Gastrointestinal: Negative.   Endocrine:       High/low sugar    Genitourinary: Negative.   Musculoskeletal: Positive for gait problem.  Skin: Negative.   Allergic/Immunologic: Negative.   Hematological: Negative.   Psychiatric/Behavioral: Negative.   All other systems reviewed and are negative.      Objective:   Physical Exam  Motor strength is 3/5 in the right deltoid bicep tricep grip 4/5 in the right hip flexor knee extensor ankle dorsiflexor 5/5 in left deltoid bicep tricep grip hip flexor knee extensor ankle was flexor Sensation difficult to evaluate secondary to aphasia. Ambulates without assistive device no evidence of toe drag or knee instability however her gait is slow.  General: No acute distress Mood and affect are appropriate Heart: Regular rate and rhythm no rubs murmurs or extra sounds Lungs: Clear to auscultation, breathing unlabored, no rales or wheezes Abdomen: Positive bowel sounds, soft nontender to palpation, nondistended Extremities: No clubbing, cyanosis, or edema Skin: No evidence of breakdown, no evidence of rash ull range of motion in all 4 extremities. No joint swelling       Assessment & Plan:  #1.  Left posterior MCA infarct with right hemiparesis and aphasia.  She would benefit from home health PT OT speech. Referral made  Patient has an appointment to see neurology in May 2.  Paroxysmal atrial fibrillation needs cardiology follow-up, in the past has seen [facility].  Will make referral.  Patient is familiar with this building therefore will make referral to the [address] practice Have written a prescription for Eliquis 3.  Tobacco abuse recommend smoking cessation currently smoking 10 cigarettes a day recommend reducing by one cigarette per day every week slowly taper  Physical medicine and rehabilitation follow-up in

## 2017-04-25 NOTE — Patient Instructions (Signed)
You need to start taking Eliquis again

## 2017-05-25 NOTE — Progress Notes (Signed)
Cardiology Office Note    Date:  05/26/2017   ID:  [name] [name], DOB 17-Oct-1950, MRN [id number]  PCP:  Preston Fleeting, MD  Cardiologist: Nanetta Batty, MD  No chief complaint on file.   History of Present Illness:  [name] [name] is a 67 y.o. female who last saw Dr. Allyson Sabal 07/2013.  She had a STEMI 07/2012 with severe three-vessel disease and underwent CABG with a LIMA to the LAD SVG to the diagonal and SVG to OM 2 to the PDA.  She was then admitted with an embolic stroke workup was unrevealing.  She has hypertension, tobacco abuse and hyperlipidemia.  TEE was without clot and she had an implantable loop recorder which found atrial fibrillation and she was started on Eliquis in 2015.  Patient was readmitted 03/2017 with left middle cerebral artery infarction.Was supposedly off Eliquis for about a year.  This was complicated by displaced fracture of the proximal phalanx the right little finger after a fall with closed reduction and splinting, on Eliquis for DVT prophylaxis.  2D echo showed normal LVEF 60%.  Eliquis was continued by neurology.  We did not see the patient that admission.  Patient comes in accompanied by her sister.  She has recovered pretty well from her stroke.  She has residual slurred speech and trouble finding words.  She has someone helping her every day but is still living alone.  She denies any cardiac complaints.  No chest pain, shortness of breath, edema, palpitations.  Continues to smoke a half pack of cigarettes daily.    Past Medical History:  Diagnosis Date  . Acute ischemic left MCA stroke (HCC) 03/11/2017  . Hyperlipidemia LDL goal < 70 07/24/2012  . Hypertension   . 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 07/25/12 07/28/2012   POSTOPERATIVE DIAGNOSIS: Severe 3-vessel coronary disease, status post  myocardial infarction.  PROCEDURE: Median sternotomy, extracorporeal circulation, coronary  artery bypass grafting x5 (left internal mammary  artery to LAD,  sequential saphenous vein graft to obtuse marginal 1, obtuse marginal 2,  and posterior descending with Y graft to 1st diagonal), endoscopic vein  harvest, left thigh.    . Seizures (HCC)    "years ago" (03/11/2017)  . STEMI (ST elevation myocardial infarction), secondary to disease of RCA, with disease of LAD and diag. branch and chronic non dominant LCX occlusion for CABG  07/21/2012  . Stroke [facility]) ?08/2012   "right sided weakness; speech isssues since" (03/11/2017)  . Tobacco abuse 07/21/2012    Past Surgical History:  Procedure Laterality Date  . CARDIAC CATHETERIZATION    . CESAREAN SECTION  X 1  . CORONARY ARTERY BYPASS GRAFT N/A 07/25/2012   Procedure: CORONARY ARTERY BYPASS GRAFTING (CABG) times five with Left  Endoscopic Saphenous Vein Harvest and Left Internal  Mammary ;  Surgeon: Loreli Slot, MD;  Location: [facility] OR;  Service: Open Heart Surgery;  Laterality: N/A;  . DILATION AND CURETTAGE OF UTERUS    . 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 LEFT HEART CATHETERIZATION WITH CORONARY ANGIOGRAM N/A 07/21/2012   Procedure: LEFT HEART CATHETERIZATION WITH CORONARY ANGIOGRAM;  Surgeon: [name], MD;  Location: [facility] CATH LAB;  Service: Cardiovascular;  Laterality: N/A;  . LOOP RECORDER IMPLANT  08-15-2012   Medtronic LinQ implanted by Dr Johney Frame for cryptogenic stroke  . TEE WITHOUT CARDIOVERSION N/A 08/15/2012   Procedure: TRANSESOPHAGEAL ECHOCARDIOGRAM (TEE);  Surgeon: Wendall Stade, MD;  Location: [facility] ENDOSCOPY;  Service: Cardiovascular;  Laterality: N/A;  . TUBAL LIGATION      Current Medications: Current Meds  Medication Sig  . amLODipine (NORVASC) 10 MG tablet Take 1 tablet (10 mg total) by mouth daily.  Marland Kitchen apixaban (ELIQUIS) 5 MG TABS tablet Take 1 tablet (5 mg total) by mouth 2 (two) times daily.  Marland Kitchen atorvastatin (LIPITOR) 80 MG tablet Take 1 tablet (80 mg total) by mouth daily.  Marland Kitchen donepezil (ARICEPT) 5 MG tablet Take 1 tablet (5 mg total) by mouth at  bedtime.  Marland Kitchen lisinopril (PRINIVIL,ZESTRIL) 20 MG tablet Take 1 tablet (20 mg total) by mouth daily.   Current Facility-Administered Medications for the 05/26/17 encounter (Office Visit) with Dyann Kief, PA-C  Medication  . apixaban (ELIQUIS) tablet 5 mg     Allergies:   Patient has no known allergies.   Social History   Socioeconomic History  . Marital status: Divorced    Spouse name: Not on file  . Number of children: 1  . Years of education: college  . Highest education level: Not on file  Occupational History  . Occupation: retired  Engineer, production  . Financial resource strain: Not on file  . Food insecurity:    Worry: Not on file    Inability: Not on file  . Transportation needs:    Medical: Not on file    Non-medical: Not on file  Tobacco Use  . Smoking status: Current Every Day Smoker    Packs/day: 0.50    Years: 33.00    Pack years: 16.50    Types: Cigarettes    Start date: 08/25/2012  . Smokeless tobacco: Never Used  Substance and Sexual Activity  . Alcohol use: No  . Drug use: No  . Sexual activity: Never  Lifestyle  . Physical activity:    Days per week: Not on file    Minutes per session: Not on file  . Stress: Not on file  Relationships  . Social connections:    Talks on phone: Not on file    Gets together: Not on file    Attends religious service: Not on file    Active member of club or organization: Not on file    Attends meetings of clubs or organizations: Not on file    Relationship status: Not on file  Other Topics Concern  . Not on file  Social History Narrative  . Not on file     Family History:  The patient's family history includes CAD in her brother; Hypertension in her mother; Stroke in her brother.   ROS:   Please see the history of present illness.    Review of Systems  Constitution: Negative.  HENT: Negative.   Eyes: Negative.   Cardiovascular: Negative.   Respiratory: Positive for snoring.   Hematologic/Lymphatic:  Negative.   Musculoskeletal: Negative.  Negative for joint pain.  Gastrointestinal: Positive for constipation.  Genitourinary: Negative.   Neurological: Positive for difficulty with concentration and loss of balance.       Slurred speech and trouble finding her words after recent CVA   All other systems reviewed and are negative.   PHYSICAL EXAM:   VS:  BP (!) 142/78   Pulse 66   Ht 5' (1.524 m)   Wt 94 lb 1.9 oz (42.7 kg)   SpO2 99%   BMI 18.38 kg/m   Physical Exam  GEN: Thin, in no acute distress  Neck: no JVD, carotid bruits, or masses Cardiac:RRR; 1/6 to 2/6 systolic murmur at the left  sternal border  respiratory:  clear to auscultation bilaterally, normal work of breathing GI: soft, nontender, nondistended, + BS Ext: without cyanosis, clubbing, or edema, Good distal pulses bilaterally Neuro:  Alert and Oriented x 3 Psych: euthymic mood, full affect  Wt Readings from Last 3 Encounters:  05/26/17 94 lb 1.9 oz (42.7 kg)  03/14/17 95 lb 0.3 oz (43.1 kg)  03/10/17 95 lb (43.1 kg)      Studies/Labs Reviewed:   EKG:  EKG is not ordered today.  The ekg reviewed from the hospital showed normal sinus rhythm with right bundle branch block, no acute change Recent Labs: 03/13/2017: Magnesium 1.9 03/15/2017: ALT 19; BUN 12; Creatinine, Ser 0.89; Hemoglobin 12.5; Platelets 245; Potassium 4.7; Sodium 138   Lipid Panel    Component Value Date/Time   CHOL 99 03/11/2017 0447   TRIG 48 03/11/2017 0447   HDL 36 (L) 03/11/2017 0447   CHOLHDL 2.8 03/11/2017 0447   VLDL 10 03/11/2017 0447   LDLCALC 53 03/11/2017 0447    Additional studies/ records that were reviewed today include:   2D echo 03/12/17 Study Conclusions   - Left ventricle: The cavity size was normal. Systolic function was   normal. The estimated ejection fraction was in the range of 55%   to 60%. Wall motion was normal; there were no regional wall   motion abnormalities. Left ventricular diastolic function    parameters were normal. - Mitral valve: There was mild regurgitation. - Right ventricle: The cavity size was mildly dilated. Wall   thickness was normal. - Right atrium: The atrium was mildly dilated. - Atrial septum: No defect or patent foramen ovale was identified. - Pulmonary arteries: PA peak pressure: 35 mm Hg (S). Carotid Dopplers 03/12/17  Final Interpretation: Right Carotid: Velocities in the right ICA are consistent with a 1-39% stenosis.  Left Carotid: Velocities in the left ICA are consistent with a 1-39% stenosis.  Vertebrals: Left vertebral artery was patent with antegrade flow. RIght             vertebral artery flow not insonated.    ASSESSMENT:    1. Acute ischemic left middle cerebral artery (MCA) stroke (HCC)   2. Coronary artery disease involving native coronary artery of native heart without angina pectoris   3. Paroxysmal atrial fibrillation (HCC)   4. Mixed hyperlipidemia   5. Benign essential HTN   6. Tobacco abuse      PLAN:  In order of problems listed above:  Acute ischemic left middle cerebral CVA 02/2017 after being off Eliquis for approximately 1 year.  Patient had prior CVA 2015.  Echo without thrombus carotid Dopplers negative.  Neurology resumed Eliquis.  Creatinine normal in the hospital.  Follow-up with Dr. Allyson Sabal.  CAD status post STEMI followed by emergency CABG times 05-2012 no angina.  Continue Lipitor  PAF maintaining normal sinus rhythm.  Continue Eliquis for stroke prevention.  Discussed importance of compliance with this medication.  Patient still has a loop recorder in but does not bother her and she does not want it removed.  Hyperlipidemia continue Lipitor  Essential hypertension blood pressure controlled on Norvasc and Prinivil  Tobacco abuse smoking cessation discussed with patient  Medication Adjustments/Labs and Tests Ordered: Current medicines are reviewed at length with the patient today.  Concerns regarding medicines are  outlined above.  Medication changes, Labs and Tests ordered today are listed in the Patient Instructions below. Patient Instructions  Medication Instructions:  Your physician recommends that you continue on your  current medications as directed. Please refer to the Current Medication list given to you today.   Labwork: None ordered  Testing/Procedures: None ordered  Follow-Up: Your physician wants you to follow-up in: 6 months with Dr. Allyson Sabal. You will receive a reminder letter in the mail two months in advance. If you don't receive a letter, please call our office to schedule the follow-up appointment.   Any Other Special Instructions Will Be Listed Below (If Applicable).     If you need a refill on your cardiac medications before your next appointment, please call your pharmacy.      [name] Clan, PA-C  05/26/2017 9:18 AM    [facility] [address] [place], [place], [place]  [address] Phone: [phone]; Fax: [phone]

## 2017-05-26 ENCOUNTER — Ambulatory Visit (INDEPENDENT_AMBULATORY_CARE_PROVIDER_SITE_OTHER): Payer: Medicare Other | Admitting: Physician Assistant

## 2017-05-26 ENCOUNTER — Encounter: Payer: Self-pay | Admitting: Physician Assistant

## 2017-05-26 VITALS — BP 142/78 | HR 66 | Ht 60.0 in | Wt 94.1 lb

## 2017-05-26 DIAGNOSIS — E782 Mixed hyperlipidemia: Secondary | ICD-10-CM

## 2017-05-26 DIAGNOSIS — I63512 Cerebral infarction due to unspecified occlusion or stenosis of left middle cerebral artery: Secondary | ICD-10-CM

## 2017-05-26 DIAGNOSIS — I10 Essential (primary) hypertension: Secondary | ICD-10-CM

## 2017-05-26 DIAGNOSIS — I480 Paroxysmal atrial fibrillation: Secondary | ICD-10-CM

## 2017-05-26 DIAGNOSIS — Z720 Tobacco use: Secondary | ICD-10-CM | POA: Diagnosis not present

## 2017-05-26 DIAGNOSIS — I2510 Atherosclerotic heart disease of native coronary artery without angina pectoris: Secondary | ICD-10-CM | POA: Diagnosis not present

## 2017-05-26 NOTE — Patient Instructions (Signed)
Medication Instructions:  Your physician recommends that you continue on your current medications as directed. Please refer to the Current Medication list given to you today.   Labwork: None ordered  Testing/Procedures: None ordered  Follow-Up: Your physician wants you to follow-up in: 6 months with Dr.Berry You will receive a reminder letter in the mail two months in advance. If you don't receive a letter, please call our office to schedule the follow-up appointment.   Any Other Special Instructions Will Be Listed Below (If Applicable).     If you need a refill on your cardiac medications before your next appointment, please call your pharmacy.  

## 2017-06-07 ENCOUNTER — Ambulatory Visit (HOSPITAL_BASED_OUTPATIENT_CLINIC_OR_DEPARTMENT_OTHER): Payer: Medicare Other | Admitting: Physical Medicine & Rehabilitation

## 2017-06-07 ENCOUNTER — Ambulatory Visit (INDEPENDENT_AMBULATORY_CARE_PROVIDER_SITE_OTHER): Payer: Medicare Other | Admitting: Neurology

## 2017-06-07 ENCOUNTER — Encounter: Payer: Self-pay | Admitting: Neurology

## 2017-06-07 ENCOUNTER — Encounter: Payer: Self-pay | Admitting: Physical Medicine & Rehabilitation

## 2017-06-07 ENCOUNTER — Encounter: Payer: Medicare Other | Attending: Physical Medicine & Rehabilitation

## 2017-06-07 VITALS — BP 167/89 | HR 65 | Ht 60.0 in | Wt 91.0 lb

## 2017-06-07 VITALS — BP 171/90 | HR 62 | Ht 60.0 in | Wt 91.2 lb

## 2017-06-07 DIAGNOSIS — I69351 Hemiplegia and hemiparesis following cerebral infarction affecting right dominant side: Secondary | ICD-10-CM | POA: Diagnosis not present

## 2017-06-07 DIAGNOSIS — I69320 Aphasia following cerebral infarction: Secondary | ICD-10-CM

## 2017-06-07 DIAGNOSIS — I63512 Cerebral infarction due to unspecified occlusion or stenosis of left middle cerebral artery: Secondary | ICD-10-CM | POA: Diagnosis not present

## 2017-06-07 NOTE — Progress Notes (Signed)
Subjective:    Patient ID: [name] [name], female    DOB: 02-May-1950, 67 y.o.   MRN: [id number] 67 year old right-handed female, history of tobacco abuse, CVA in 2014 with residual right-sided weakness, status post loop recorder placement, maintained on Eliquis, received inpatient rehab services, PAF, CAD with CABG, history of dementia.  Lives with her boyfriend of 20 years who works 12-hour shifts.  Reported to be independent prior to admission.  Presented on March 10, 2017, after acute left MCA infarction.  Noted to have slurred speech, facial droop.  Symptoms appeared to have started on March 09, 2017, when she felt right-sided weakness after a fall.  In the ED, x-rays of the right hand were taken that showed fracture of the right proximal 5th phalanx.  CT of the head unremarkable.  MRI showed left MCA, PCA infarction and patchy small acute ischemic nonhemorrhagic left MCA infarctions.  The patient did not receive tPA.  Neurology consulted for workup.  Echocardiogram with ejection fraction of 60%, no wall motion abnormalities.  Tolerating a regular diet.  Underwent closed reduction and splinting for displaced fracture of the proximal phalanx on the right little finger, nonweightbearing.  HPI Doesn't have transportation for Outpt therapy  Pt did not get HH because she is aphasic and doesn't have a phone Pt's sister would like to be contact person Pt states appetite is good Patient is able to dress and bathe herself continues to have right-sided weakness. Pain Inventory Average Pain 0 Pain Right Now 0 My pain is aching  In the last 24 hours, has pain interfered with the following? General activity 0 Relation with others 0 Enjoyment of life 0 What TIME of day is your pain at its worst? night Sleep (in general) Fair  Pain is worse with: walking, bending, sitting, inactivity and standing Pain improves with: rest, therapy/exercise and medication Relief from Meds:  5  Mobility walk without assistance ability to climb steps?  yes do you drive?  no  Function I need assistance with the following:  meal prep, household duties and shopping  Neuro/Psych numbness tingling trouble walking  Prior Studies Any changes since last visit?  no  Physicians involved in your care Any changes since last visit?  no   Family History  Problem Relation Age of Onset  . Hypertension Mother   . Stroke Brother        Died at age 4 secondary to MI  . CAD Brother    Social History   Socioeconomic History  . Marital status: Divorced    Spouse name: Not on file  . Number of children: 1  . Years of education: college  . Highest education level: Not on file  Occupational History  . Occupation: retired  Engineer, production  . Financial resource strain: Not on file  . Food insecurity:    Worry: Not on file    Inability: Not on file  . Transportation needs:    Medical: Not on file    Non-medical: Not on file  Tobacco Use  . Smoking status: Current Every Day Smoker    Packs/day: 0.50    Years: 33.00    Pack years: 16.50    Types: Cigarettes    Start date: 08/25/2012  . Smokeless tobacco: Never Used  Substance and Sexual Activity  . Alcohol use: No  . Drug use: No  . Sexual activity: Never  Lifestyle  . Physical activity:    Days per week: Not on file    Minutes  per session: Not on file  . Stress: Not on file  Relationships  . Social connections:    Talks on phone: Not on file    Gets together: Not on file    Attends religious service: Not on file    Active member of club or organization: Not on file    Attends meetings of clubs or organizations: Not on file    Relationship status: Not on file  Other Topics Concern  . Not on file  Social History Narrative  . Not on file   Past Surgical History:  Procedure Laterality Date  . CARDIAC CATHETERIZATION    . CESAREAN SECTION  X 1  . CORONARY ARTERY BYPASS GRAFT N/A 07/25/2012   Procedure: CORONARY  ARTERY BYPASS GRAFTING (CABG) times five with Left  Endoscopic Saphenous Vein Harvest and Left Internal  Mammary ;  Surgeon: Loreli Slot, MD;  Location: [facility] OR;  Service: Open Heart Surgery;  Laterality: N/A;  . DILATION AND CURETTAGE OF UTERUS    . 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 LEFT HEART CATHETERIZATION WITH CORONARY ANGIOGRAM N/A 07/21/2012   Procedure: LEFT HEART CATHETERIZATION WITH CORONARY ANGIOGRAM;  Surgeon: [name], MD;  Location: [facility] CATH LAB;  Service: Cardiovascular;  Laterality: N/A;  . LOOP RECORDER IMPLANT  08-15-2012   Medtronic LinQ implanted by Dr Johney Frame for cryptogenic stroke  . TEE WITHOUT CARDIOVERSION N/A 08/15/2012   Procedure: TRANSESOPHAGEAL ECHOCARDIOGRAM (TEE);  Surgeon: Wendall Stade, MD;  Location: [facility] ENDOSCOPY;  Service: Cardiovascular;  Laterality: N/A;  . TUBAL LIGATION     Past Medical History:  Diagnosis Date  . Acute ischemic left MCA stroke (HCC) 03/11/2017  . Hyperlipidemia LDL goal < 70 07/24/2012  . Hypertension   . 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 07/25/12 07/28/2012   POSTOPERATIVE DIAGNOSIS: Severe 3-vessel coronary disease, status post  myocardial infarction.  PROCEDURE: Median sternotomy, extracorporeal circulation, coronary  artery bypass grafting x5 (left internal mammary artery to LAD,  sequential saphenous vein graft to obtuse marginal 1, obtuse marginal 2,  and posterior descending with Y graft to 1st diagonal), endoscopic vein  harvest, left thigh.    . Seizures (HCC)    "years ago" (03/11/2017)  . STEMI (ST elevation myocardial infarction), secondary to disease of RCA, with disease of LAD and diag. branch and chronic non dominant LCX occlusion for CABG  07/21/2012  . Stroke [facility]) ?08/2012   "right sided weakness; speech isssues since" (03/11/2017)  . Tobacco abuse 07/21/2012   BP (!) 167/89   Pulse 65   Ht 5' (1.524 m)   Wt 91 lb (41.3 kg)   SpO2 99%   BMI 17.77 kg/m   Opioid Risk Score:     Fall Risk Score:  `1  Depression screen PHQ 2/9  No flowsheet data found.   Review of Systems  Constitutional: Negative.   HENT: Negative.   Eyes: Negative.   Respiratory: Negative.   Cardiovascular: Negative.   Gastrointestinal: Negative.   Endocrine: Negative.   Genitourinary: Negative.   Musculoskeletal: Positive for gait problem.  Skin: Negative.   Allergic/Immunologic: Negative.   Neurological: Positive for weakness and numbness.  Hematological: Negative.   Psychiatric/Behavioral: Negative.   All other systems reviewed and are negative.      Objective:   Physical Exam  Constitutional: She appears well-developed. No distress.  Very thin  HENT:  Head: Normocephalic and atraumatic.  Eyes: Pupils are equal, round, and reactive to light. EOM are  normal.  Neck: Normal range of motion. Neck supple.  Neurological: She is alert.  Skin: She is not diaphoretic.  Patient is  aphasic/anomic   3+/5 RUE deltoid, bicep, tricep, grip 4/5 RLE hip flexor, knee extensor, ankle dorsiflexor and plantar flexor 5/5 on left side  Anomia- able to say watch but not ring Limited spontaneous speech is able to answer simple questions accurate yes knows    Assessment & Plan:  #1.  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  Overall making some improvements in terms of her upper extremity strength but would benefit from home health PT OT and speech therapy.  We will write order for this.  Her sister needs to become her contact in order to schedule this since the patient has neither a phone and is a phasic physical medicine rehab follow-up in 3 months

## 2017-06-07 NOTE — Progress Notes (Signed)
[facility] [address] [place]. [place] [address] [phone]       OFFICE FOLLOW-UP NOTE  Ms. MIKU UDALL Date of Birth:  1950-03-31 Medical Record Number:  [id number]   HPI: [name] [name] is a 67 year old lady seen today for first office follow-up visit for hospital follow-up for stroke and every 2019. History is obtained from her as well as her sister and review of electronic medical records. I have personally reviewed imaging films.LIVIER HENDEL is a 67 y.o. female with a past medical history of atrial fibrillation on Eliquis, hypertension, hyperlipidemia, CABG, stroke in the past affecting the left MCA territory with residual mild aphasia and right-sided weakness and sensory loss, tobacco abuse who presents to the emergency room for evaluation of right facial drooping, difficulty talking and pain in her right hand.  Patient was unable to provide complete history.  Her brother was at bedside at the time of this encounter.  He said that the family or friends had noticed that she was not talking normally.  Since her last stroke, she has had speech deficits but was able to communicate with nearly complete sentences.  But since night of 03/09/2017, she has not been able to talk normally.  She was also noted to have a deformed and swollen right hand and the presumption is that she had a fall and probably hurt her hand but she denies any history of falls. Patient was evaluated in the emergency room by the ED providers.  An MRI was obtained given the history of stroke.  The MRI showed the remote moderate to large sized left posterior MCA and PCA territory infarct but also showed patchy acute nonhemorrhagic strokes in the left MCA territory, very cortical involving the frontal and parietal lobes. A neurological consultation was requested for evaluation of the strokes and further workup recommendations. According to the patient's family member at the bedside, no clear-cut explanation  was found for her strokes in 2014 when she had a large stroke. Patient had an implantable loop recorder placed in 2014 and atrial fibrillation was detected at some point in 2015 from the implantable loop recorder she was started on Eliquis. According to the patient, she says that she is on aspirin but could not confirm that she is taking aspirin or Eliquis. Family member at the bedside could not confirm the medication as well.  LKW: Greater than 24 hours prior to arrival.tpa given?: no, outside the window. NIH on admission 11. CT scan of the head showed no acute abnormality. CT angiogram was negative for large vessel occlusion but did show greater than 80% short segment proximal right ICA stenosis and severe stenosis at the origin of the left vertebral artery and extensive intracranial atherosclerotic changes.carotid ultrasound however showed no significant extracranial stenosis and right vertebral artery could not be insonated.MRI scan of the brain showed patchy small volume acute nonhemorrhagic infarcts in the left MCA territory involving the frontal and parietal lobes. There were also moderate size remote age left posterior MCA and PCA infarcts.2-D echocardiogram showed normal ejection fraction without cardiac source of embolism.LDL cholesterol 53 mg percent and hemoglobin A1c was 5.6. Patient was started on eliquis for anticoagulation and counseled to be compliant with the medication. She is currently living at home. She still has expressive aphasia and occasional paraphasias and nonfluent speech. Her right-sided strength is improving dose she still has some mild right grip weakness. Patient for some reason did not get home physical and occupational therapy. She states she is  walking good and balance is not poor and she's had no falls. She is tolerating eliquis without significant bruising or bleeding. She is living at home with her friend. She states her blood pressure normally well controlled and today it  is elevated at 171/90.   ROS:   14 system review of systems is positive for cough, loss of vision on constipation, speech difficulty, walking difficulty, snoring and all other systems negative  PMH:  Past Medical History:  Diagnosis Date  . Acute ischemic left MCA stroke (HCC) 03/11/2017  . Hyperlipidemia LDL goal < 70 07/24/2012  . Hypertension   . 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 07/25/12 07/28/2012   POSTOPERATIVE DIAGNOSIS: Severe 3-vessel coronary disease, status post  myocardial infarction.  PROCEDURE: Median sternotomy, extracorporeal circulation, coronary  artery bypass grafting x5 (left internal mammary artery to LAD,  sequential saphenous vein graft to obtuse marginal 1, obtuse marginal 2,  and posterior descending with Y graft to 1st diagonal), endoscopic vein  harvest, left thigh.    . Seizures (HCC)    "years ago" (03/11/2017)  . STEMI (ST elevation myocardial infarction), secondary to disease of RCA, with disease of LAD and diag. branch and chronic non dominant LCX occlusion for CABG  07/21/2012  . Stroke [facility]) ?08/2012   "right sided weakness; speech isssues since" (03/11/2017)  . Tobacco abuse 07/21/2012    Social History:  Social History   Socioeconomic History  . Marital status: Divorced    Spouse name: Not on file  . Number of children: 1  . Years of education: college  . Highest education level: Not on file  Occupational History  . Occupation: retired  Engineer, production  . Financial resource strain: Not on file  . Food insecurity:    Worry: Not on file    Inability: Not on file  . Transportation needs:    Medical: Not on file    Non-medical: Not on file  Tobacco Use  . Smoking status: Current Every Day Smoker    Packs/day: 0.50    Years: 33.00    Pack years: 16.50    Types: Cigarettes    Start date: 08/25/2012  . Smokeless tobacco: Never Used  Substance and Sexual Activity  . Alcohol use: No  . Drug use: No  . Sexual activity: Never    Lifestyle  . Physical activity:    Days per week: Not on file    Minutes per session: Not on file  . Stress: Not on file  Relationships  . Social connections:    Talks on phone: Not on file    Gets together: Not on file    Attends religious service: Not on file    Active member of club or organization: Not on file    Attends meetings of clubs or organizations: Not on file    Relationship status: Not on file  . Intimate partner violence:    Fear of current or ex partner: Not on file    Emotionally abused: Not on file    Physically abused: Not on file    Forced sexual activity: Not on file  Other Topics Concern  . Not on file  Social History Narrative  . Not on file    Medications:   Current Outpatient Medications on File Prior to Visit  Medication Sig Dispense Refill  . amLODipine (NORVASC) 10 MG tablet Take 1 tablet (10 mg total) by mouth daily. 30 tablet 1  . apixaban (ELIQUIS) 5 MG TABS tablet  Take 1 tablet (5 mg total) by mouth 2 (two) times daily. 60 tablet 3  . atorvastatin (LIPITOR) 80 MG tablet Take 1 tablet (80 mg total) by mouth daily. 30 tablet 1  . donepezil (ARICEPT) 5 MG tablet Take 1 tablet (5 mg total) by mouth at bedtime. 30 tablet 5  . lisinopril (PRINIVIL,ZESTRIL) 20 MG tablet Take 1 tablet (20 mg total) by mouth daily. 30 tablet 1   Current Facility-Administered Medications on File Prior to Visit  Medication Dose Route Frequency Provider Last Rate Last Dose  . apixaban (ELIQUIS) tablet 5 mg  5 mg Oral BID Kirsteins, Victorino Sparrow, MD        Allergies:  No Known Allergies  Physical Exam General: well developed, well nourished, seated, in no evident distress Head: head normocephalic and atraumatic.  Neck: supple with no carotid or supraclavicular bruits Cardiovascular: regular rate and rhythm, no murmurs Musculoskeletal: no deformity Skin:  no rash/petichiae Vascular:  Normal pulses all extremities Vitals:   06/07/17 0906  BP: (!) 171/90  Pulse: 62    Neurologic Exam Mental Status: Awake and fully alert. Oriented to place and time. Recent and remote memory intact. Attention span, concentration and fund of knowledge appropriate. Mood and affect appropriate. Mild expressive aphasia with nonfluent speech with word finding difficulties. No paraphasias. Good comprehension. Nodysarthria Cranial Nerves: Fundoscopic exam reveals sharp disc margins. Pupils equal, briskly reactive to light. Extraocular movements full without nystagmus. Visual fields full to confrontation. Hearing intact. Facial sensation intact. And mild right nasolabial fold asymmetry., tongue, palate moves normally and symmetrically.  Motor: Normal bulk and tone. Normal strength in all tested extremity muscles. Sensory.: intact to touch ,pinprick .position and vibratory sensation.  Coordination: Rapid alternating movements normal in all extremities. Finger-to-nose and heel-to-shin performed accurately bilaterally. Gait and Station: Arises from chair without difficulty. Stance is normal. Gait demonstrates normal stride length and balance . Able to heel, toe and tandem walk without difficulty.  Reflexes: 1+ and symmetric. Toes downgoing.   NIHSS  2 Modified Rankin  2  ASSESSMENT: 67 year patient will embolic left MCA branch infarcts in February 2019 secondary to atrial fibrillation while not on anticoagulation. Vascular risk factors of atrial fibrillation, hypertension and hyperlipidemia.    PLAN: I had a long d/w patient and her sister about her recent stroke, atrial fibrillation,aphasia,risk for recurrent stroke/TIAs, personally independently reviewed imaging studies and stroke evaluation results and answered questions.Continue Eliquis (apixaban) daily  for secondary stroke prevention and maintain strict control of hypertension with blood pressure goal below 130/90, diabetes with hemoglobin A1c goal below 6.5% and lipids with LDL cholesterol goal below 70 mg/dL. I also advised the  patient to eat a healthy diet with plenty of whole grains, cereals, fruits and vegetables, exercise regularly and maintain ideal body weight I will refer her for outpatient speech and occupational therapy which she prefers to be done at [facility] in [place].Followup in the future with my nurse practitioner Shanda Bumps in 3 months or call earlier if necessary. Greater than 50% of time during this 25 minute visit was spent on counseling,explanation of diagnosis of embolic stroke, planning of further management, discussion with patient and family and coordination of care Delia Heady, MD  [facility] [address] [place], [place] [address]  Phone [phone] Fax [phone] Note: This document was prepared with digital dictation and possible smart phrase technology. Any transcriptional errors that result from this process are unintentional

## 2017-06-07 NOTE — Patient Instructions (Signed)
I had a long d/w patient and her sister about her recent stroke, atrial fibrillation,aphasia,risk for recurrent stroke/TIAs, personally independently reviewed imaging studies and stroke evaluation results and answered questions.Continue Eliquis (apixaban) daily  for secondary stroke prevention and maintain strict control of hypertension with blood pressure goal below 130/90, diabetes with hemoglobin A1c goal below 6.5% and lipids with LDL cholesterol goal below 70 mg/dL. I also advised the patient to eat a healthy diet with plenty of whole grains, cereals, fruits and vegetables, exercise regularly and maintain ideal body weight I will refer her for outpatient speech and occupational therapy which she prefers to be done at [facility] in [place].Followup in the future with my nurse practitioner Shanda Bumps in 3 months or call earlier if necessary.  Stroke Prevention Some medical conditions and behaviors are associated with a higher chance of having a stroke. You can help prevent a stroke by making nutrition, lifestyle, and other changes, including managing any medical conditions you may have. What nutrition changes can be made?  Eat healthy foods. You can do this by: ? Choosing foods high in fiber, such as fresh fruits and vegetables and whole grains. ? Eating at least 5 or more servings of fruits and vegetables a day. Try to fill half of your plate at each meal with fruits and vegetables. ? Choosing lean protein foods, such as lean cuts of meat, poultry without skin, fish, tofu, beans, and nuts. ? Eating low-fat dairy products. ? Avoiding foods that are high in salt (sodium). This can help lower blood pressure. ? Avoiding foods that have saturated fat, trans fat, and cholesterol. This can help prevent high cholesterol. ? Avoiding processed and premade foods.  Follow your health care provider's specific guidelines for losing weight, controlling high blood pressure (hypertension),  lowering high cholesterol, and managing diabetes. These may include: ? Reducing your daily calorie intake. ? Limiting your daily sodium intake to 1,500 milligrams (mg). ? Using only healthy fats for cooking, such as olive oil, canola oil, or sunflower oil. ? Counting your daily carbohydrate intake. What lifestyle changes can be made?  Maintain a healthy weight. Talk to your health care provider about your ideal weight.  Get at least 30 minutes of moderate physical activity at least 5 days a week. Moderate activity includes brisk walking, biking, and swimming.  Do not use any products that contain nicotine or tobacco, such as cigarettes and e-cigarettes. If you need help quitting, ask your health care provider. It may also be helpful to avoid exposure to secondhand smoke.  Limit alcohol intake to no more than 1 drink a day for nonpregnant women and 2 drinks a day for men. One drink equals 12 oz of beer, 5 oz of wine, or 1 oz of hard liquor.  Stop any illegal drug use.  Avoid taking birth control pills. Talk to your health care provider about the risks of taking birth control pills if: ? You are over 48 years old. ? You smoke. ? You get migraines. ? You have ever had a blood clot. What other changes can be made?  Manage your cholesterol levels. ? Eating a healthy diet is important for preventing high cholesterol. If cholesterol cannot be managed through diet alone, you may also need to take medicines. ? Take any prescribed medicines to control your cholesterol as told by your health care provider.  Manage your diabetes. ? Eating a healthy diet and exercising regularly are important parts of managing your blood sugar. If your blood  sugar cannot be managed through diet and exercise, you may need to take medicines. ? Take any prescribed medicines to control your diabetes as told by your health care provider.  Control your hypertension. ? To reduce your risk of stroke, try to keep your  blood pressure below 130/80. ? Eating a healthy diet and exercising regularly are an important part of controlling your blood pressure. If your blood pressure cannot be managed through diet and exercise, you may need to take medicines. ? Take any prescribed medicines to control hypertension as told by your health care provider. ? Ask your health care provider if you should monitor your blood pressure at home. ? Have your blood pressure checked every year, even if your blood pressure is normal. Blood pressure increases with age and some medical conditions.  Get evaluated for sleep disorders (sleep apnea). Talk to your health care provider about getting a sleep evaluation if you snore a lot or have excessive sleepiness.  Take over-the-counter and prescription medicines only as told by your health care provider. Aspirin or blood thinners (antiplatelets or anticoagulants) may be recommended to reduce your risk of forming blood clots that can lead to stroke.  Make sure that any other medical conditions you have, such as atrial fibrillation or atherosclerosis, are managed. What are the warning signs of a stroke? The warning signs of a stroke can be easily remembered as BEFAST.  B is for balance. Signs include: ? Dizziness. ? Loss of balance or coordination. ? Sudden trouble walking.  E is for eyes. Signs include: ? A sudden change in vision. ? Trouble seeing.  F is for face. Signs include: ? Sudden weakness or numbness of the face. ? The face or eyelid drooping to one side.  A is for arms. Signs include: ? Sudden weakness or numbness of the arm, usually on one side of the body.  S is for speech. Signs include: ? Trouble speaking (aphasia). ? Trouble understanding.  T is for time. ? These symptoms may represent a serious problem that is an emergency. Do not wait to see if the symptoms will go away. Get medical help right away. Call your local emergency services (911 in the [place] Do not  drive yourself to the hospital.  Other signs of stroke may include: ? A sudden, severe headache with no known cause. ? Nausea or vomiting. ? Seizure.  Where to find more information: For more information, visit:  American Stroke Association: www.strokeassociation.org  National Stroke Association: www.stroke.org  Summary  You can prevent a stroke by eating healthy, exercising, not smoking, limiting alcohol intake, and managing any medical conditions you may have.  Do not use any products that contain nicotine or tobacco, such as cigarettes and e-cigarettes. If you need help quitting, ask your health care provider. It may also be helpful to avoid exposure to secondhand smoke.  Remember BEFAST for warning signs of stroke. Get help right away if you or a loved one has any of these signs. This information is not intended to replace advice given to you by your health care provider. Make sure you discuss any questions you have with your health care provider. Document Released: 02/26/2004 Document Revised: 02/24/2016 Document Reviewed: 02/24/2016 Elsevier Interactive Patient Education  [employer].

## 2017-06-23 ENCOUNTER — Telehealth: Payer: Self-pay | Admitting: *Deleted

## 2017-06-23 NOTE — Telephone Encounter (Signed)
Amy Mckey, HHST, [facility] left a message asking for HHST 2week3.  Verbal orders given per office protocol.

## 2017-06-23 NOTE — Telephone Encounter (Signed)
Amy Mckey, ST, [facility] left a message asking for HHST which was approved. Also, she asked for verbal orders  For permission to receive verbal orders from patient's PCP.  Apparently this is needed to satisfy CMS.  Please advise

## 2017-06-23 NOTE — Telephone Encounter (Signed)
Ok for [employer] tx  We can always approve orders from PCP

## 2017-06-24 NOTE — Telephone Encounter (Signed)
Irving Burton, PT at [facility] called asking for verbal orders for PT 1wk3 and OT eval. Per notes pt was discharged in February so the order will need to come from PCP. Left message on Irving Burton VM notifying her.

## 2017-06-28 ENCOUNTER — Telehealth: Payer: Self-pay

## 2017-06-28 NOTE — Telephone Encounter (Signed)
Approval given to [name] [name] for ST and to take orders if needed from PCP.  I advised her that OT eval and PT request approved too. The PCP request was confusing but per DR Kirsteins ok.  She will let them know.

## 2017-06-29 ENCOUNTER — Ambulatory Visit: Payer: Medicare Other | Admitting: Occupational Therapy

## 2017-06-29 NOTE — Telephone Encounter (Addendum)
Irving Burton PT [facility] called requesting verbal orders for 2xwk X 3wks.  Called her back and left message approving verbal orders.

## 2017-07-01 DIAGNOSIS — F0390 Unspecified dementia without behavioral disturbance: Secondary | ICD-10-CM

## 2017-07-01 DIAGNOSIS — G249 Dystonia, unspecified: Secondary | ICD-10-CM

## 2017-07-01 DIAGNOSIS — I69351 Hemiplegia and hemiparesis following cerebral infarction affecting right dominant side: Secondary | ICD-10-CM

## 2017-07-01 DIAGNOSIS — Z9181 History of falling: Secondary | ICD-10-CM

## 2017-07-01 DIAGNOSIS — I10 Essential (primary) hypertension: Secondary | ICD-10-CM

## 2017-07-01 DIAGNOSIS — Z7901 Long term (current) use of anticoagulants: Secondary | ICD-10-CM

## 2017-07-01 DIAGNOSIS — I480 Paroxysmal atrial fibrillation: Secondary | ICD-10-CM

## 2017-07-01 DIAGNOSIS — I252 Old myocardial infarction: Secondary | ICD-10-CM

## 2017-07-01 DIAGNOSIS — I2510 Atherosclerotic heart disease of native coronary artery without angina pectoris: Secondary | ICD-10-CM

## 2017-07-01 DIAGNOSIS — Z87891 Personal history of nicotine dependence: Secondary | ICD-10-CM

## 2017-07-01 DIAGNOSIS — G231 Progressive supranuclear ophthalmoplegia [Steele-Richardson-Olszewski]: Secondary | ICD-10-CM

## 2017-07-01 DIAGNOSIS — I69320 Aphasia following cerebral infarction: Secondary | ICD-10-CM

## 2017-07-01 DIAGNOSIS — G40909 Epilepsy, unspecified, not intractable, without status epilepticus: Secondary | ICD-10-CM

## 2017-07-01 DIAGNOSIS — Z952 Presence of prosthetic heart valve: Secondary | ICD-10-CM

## 2017-07-05 ENCOUNTER — Telehealth: Payer: Self-pay

## 2017-07-05 ENCOUNTER — Encounter: Payer: Medicare Other | Admitting: Occupational Therapy

## 2017-07-05 NOTE — Telephone Encounter (Signed)
[name] [name] OT [facility] called requesting verbal orders for 2xwk X 2wks followed by 1xwk X 1wk.  Called her back and approved verbal orders.  In addition:  Emily PT [facility] called requesting a 1xwk X 1wk extension due to patient being not well at one of her visits.  Called her back and approved extension.,

## 2017-07-07 ENCOUNTER — Telehealth: Payer: Self-pay

## 2017-07-07 ENCOUNTER — Encounter: Payer: Medicare Other | Admitting: Occupational Therapy

## 2017-07-07 NOTE — Telephone Encounter (Signed)
[name] [name] ST [facility] called requesting an extension of verbal orders for 2xwk X 3wks, called her back and approved verbal orders.

## 2017-07-08 ENCOUNTER — Telehealth: Payer: Self-pay | Admitting: *Deleted

## 2017-07-08 NOTE — Telephone Encounter (Signed)
Harriett Sine OT [facility] called and Ms [name] [name] missed a visit today (called x2) and requesting 1wk1 ext to make up for missed visit.  Approval given.

## 2017-07-12 ENCOUNTER — Encounter: Payer: Medicare Other | Admitting: Occupational Therapy

## 2017-07-15 ENCOUNTER — Telehealth: Payer: Self-pay

## 2017-07-15 NOTE — Telephone Encounter (Signed)
Amy Mckey ST [facility] called requesting verbal orders for a social worker eval for this patient.  She states the patient is having problems arranging transportation to acquire her medications and is unable to get them due to this issue.  Called her back and approved verbal orders.

## 2017-07-18 ENCOUNTER — Encounter: Payer: Medicare Other | Admitting: Occupational Therapy

## 2017-07-20 ENCOUNTER — Encounter: Payer: Medicare Other | Admitting: Occupational Therapy

## 2017-07-22 ENCOUNTER — Other Ambulatory Visit
Admission: RE | Admit: 2017-07-22 | Discharge: 2017-07-22 | Disposition: A | Discharge status: Home / Self Care | Payer: Medicare Other | Source: Ambulatory Visit | Attending: Podiatry | Admitting: Podiatry

## 2017-07-22 DIAGNOSIS — S91102A Unspecified open wound of left great toe without damage to nail, initial encounter: Secondary | ICD-10-CM | POA: Insufficient documentation

## 2017-07-25 ENCOUNTER — Telehealth: Payer: Self-pay

## 2017-07-25 ENCOUNTER — Encounter: Payer: Medicare Other | Admitting: Occupational Therapy

## 2017-07-25 NOTE — Telephone Encounter (Signed)
Amy McKee-ST with [facility] called to info that pt cancelled on Friday 6/14 but did see her for both visits week of 6/17.

## 2017-07-28 ENCOUNTER — Encounter: Payer: Medicare Other | Admitting: Occupational Therapy

## 2017-07-28 LAB — AEROBIC/ANAEROBIC CULTURE (SURGICAL/DEEP WOUND)

## 2017-07-28 LAB — AEROBIC/ANAEROBIC CULTURE W GRAM STAIN (SURGICAL/DEEP WOUND)

## 2017-08-02 ENCOUNTER — Encounter: Payer: Medicare Other | Admitting: Occupational Therapy

## 2017-08-09 ENCOUNTER — Encounter: Payer: Medicare Other | Admitting: Occupational Therapy

## 2017-08-11 ENCOUNTER — Encounter: Payer: Medicare Other | Admitting: Occupational Therapy

## 2017-08-16 ENCOUNTER — Encounter: Payer: Medicare Other | Admitting: Occupational Therapy

## 2017-08-18 ENCOUNTER — Encounter: Payer: Medicare Other | Admitting: Occupational Therapy

## 2017-08-23 ENCOUNTER — Encounter: Payer: Medicare Other | Admitting: Occupational Therapy

## 2017-08-25 ENCOUNTER — Encounter: Payer: Medicare Other | Admitting: Occupational Therapy

## 2017-08-30 ENCOUNTER — Encounter: Payer: Medicare Other | Admitting: Occupational Therapy

## 2017-09-01 ENCOUNTER — Encounter: Payer: Medicare Other | Admitting: Occupational Therapy

## 2017-09-09 NOTE — Progress Notes (Signed)
[facility] [address] [place]. [place] [address] [phone]       OFFICE FOLLOW-UP NOTE  [name] Date of Birth:  [birth year] Medical Record Number:  [id number]   HPI: [name] [name] is a 67 year old lady seen today for follow-up visit for stroke and 03/11/2017. History is obtained from her as well as her sister and review of electronic medical records. I have personally reviewed imaging films.[name] [name] is a 67 y.o. female with a past medical history of atrial fibrillation on Eliquis, hypertension, hyperlipidemia, CABG, stroke in the past affecting the left MCA territory with residual mild aphasia and right-sided weakness and sensory loss, tobacco abuse who presents to the emergency room for evaluation of right facial drooping, difficulty talking and pain in her right hand.  [name] [name] was unable to provide complete history.  Her brother was at bedside at the time of this encounter.  He said that the family or friends had noticed that [name] [name] was not talking normally.  Since her last stroke, [name] [name] has had speech deficits but was able to communicate with nearly complete sentences.  But since night of 03/09/2017, [name] [name] has not been able to talk normally.  [name] [name] was also noted to have a deformed and swollen right hand and the presumption is that [name] [name] had a fall and probably hurt her hand but [name] [name] denies any history of falls. [name] [name] was evaluated in the emergency room by the ED providers.  An MRI was obtained given the history of stroke.  The MRI showed the remote moderate to large sized left posterior MCA and PCA territory infarct but also showed patchy acute nonhemorrhagic strokes in the left MCA territory, very cortical involving the frontal and parietal lobes. A neurological consultation was requested for evaluation of the strokes and further workup recommendations. According to the [name] [name]'s family member at the bedside, no clear-cut explanation was found for her strokes in 2014 when  [name] [name] had a large stroke. [name] [name] had an implantable loop recorder placed in 2014 and atrial fibrillation was detected at some point in 2015 from the implantable loop recorder [name] [name] was started on Eliquis. According to the [name] [name], [name] [name] says that [name] [name] is on aspirin but could not confirm that [name] [name] is taking aspirin or Eliquis. Family member at the bedside could not confirm the medication as well. LKW: Greater than 24 hours prior to arrival.tpa given?: no, outside the window. [facility] on admission 11. CT scan of the head showed no acute abnormality. CT angiogram was negative for large vessel occlusion but did show greater than 80% short segment proximal right ICA stenosis and severe stenosis at the origin of the left vertebral artery and extensive intracranial atherosclerotic changes.carotid ultrasound however showed no significant extracranial stenosis and right vertebral artery could not be insonated.MRI scan of the brain showed patchy small volume acute nonhemorrhagic infarcts in the left MCA territory involving the frontal and parietal lobes. There were also moderate size remote age left posterior MCA and PCA infarcts.2-D echocardiogram showed normal ejection fraction without cardiac source of embolism.LDL cholesterol 53 mg percent and hemoglobin A1c was 5.6. [name] [name] was started on eliquis for anticoagulation and counseled to be compliant with the medication.   06/07/17 visit PS: [name] [name] is currently living at home. [name] [name] still has expressive aphasia and occasional paraphasias and nonfluent speech. Her right-sided strength is improving dose [name] [name] still has some mild right grip weakness. [name] [name] for some reason did not get home physical and occupational therapy. [name] [name] states [name] [name] is walking good  and balance is not poor and [name] [name]'s had no falls. [name] [name] is tolerating eliquis without significant bruising or bleeding. [name] [name] is living at home with her friend. [name] [name] states her blood pressure normally well controlled and today it is elevated at  171/90.  Interval History 09/09/17: [name] [name] is being seen today for follow-up appointment and is accompanied by her sister.  [name] [name] continues to have residual right hemiparesis and expressive aphasia but continues to participate in PT/OT/ST in [place].  [name] [name] continues to take Eliquis without side effects of bleeding or bruising and is followed by cardiology for atrial fibrillation and Eliquis management.  [name] [name] continues to take Lipitor without side effects of myalgias.  Blood pressure today satisfactory at 123/81 and [name] [name] does continue to monitor this at home.  [name] [name] does live independently but does have a friend that stops by daily to check on her and assist her with minimal ADLs.  Denies new or worsening stroke/TIA symptoms.    ROS:   14 system review of systems is positive for loss of vision, cough, daytime sleepiness, snoring, speech difficulty and all other systems negative  PMH:  Past Medical History:  Diagnosis Date  . Acute ischemic left MCA stroke (HCC) 03/11/2017  . Hyperlipidemia LDL goal < 70 07/24/2012  . Hypertension   . 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 07/25/12 07/28/2012   POSTOPERATIVE DIAGNOSIS: Severe 3-vessel coronary disease, status post  myocardial infarction.  PROCEDURE: Median sternotomy, extracorporeal circulation, coronary  artery bypass grafting x5 (left internal mammary artery to LAD,  sequential saphenous vein graft to obtuse marginal 1, obtuse marginal 2,  and posterior descending with Y graft to 1st diagonal), endoscopic vein  harvest, left thigh.    . Seizures (HCC)    "years ago" (03/11/2017)  . STEMI (ST elevation myocardial infarction), secondary to disease of RCA, with disease of LAD and diag. branch and chronic non dominant LCX occlusion for CABG  07/21/2012  . Stroke [facility]) ?08/2012   "right sided weakness; speech isssues since" (03/11/2017)  . Tobacco abuse 07/21/2012    Social History:  Social History   Socioeconomic History  . Marital status:  Divorced    Spouse name: Not on file  . Number of children: 1  . Years of education: college  . Highest education level: Not on file  Occupational History  . Occupation: retired  Engineer, production  . Financial resource strain: Not on file  . Food insecurity:    Worry: Not on file    Inability: Not on file  . Transportation needs:    Medical: Not on file    Non-medical: Not on file  Tobacco Use  . Smoking status: Current Every Day Smoker    Packs/day: 0.50    Years: 33.00    Pack years: 16.50    Types: Cigarettes    Start date: 08/25/2012  . Smokeless tobacco: Never Used  Substance and Sexual Activity  . Alcohol use: No  . Drug use: No  . Sexual activity: Never  Lifestyle  . Physical activity:    Days per week: Not on file    Minutes per session: Not on file  . Stress: Not on file  Relationships  . Social connections:    Talks on phone: Not on file    Gets together: Not on file    Attends religious service: Not on file    Active member of club or organization: Not on file    Attends meetings of clubs or organizations: Not on  file    Relationship status: Not on file  . Intimate partner violence:    Fear of current or ex partner: Not on file    Emotionally abused: Not on file    Physically abused: Not on file    Forced sexual activity: Not on file  Other Topics Concern  . Not on file  Social History Narrative  . Not on file    Medications:   Current Outpatient Medications on File Prior to Visit  Medication Sig Dispense Refill  . amLODipine (NORVASC) 10 MG tablet Take 1 tablet (10 mg total) by mouth daily. 30 tablet 1  . apixaban (ELIQUIS) 5 MG TABS tablet Take 1 tablet (5 mg total) by mouth 2 (two) times daily. 60 tablet 3  . atorvastatin (LIPITOR) 80 MG tablet Take 1 tablet (80 mg total) by mouth daily. 30 tablet 1  . donepezil (ARICEPT) 5 MG tablet Take 1 tablet (5 mg total) by mouth at bedtime. 30 tablet 5  . lisinopril (PRINIVIL,ZESTRIL) 20 MG tablet Take 1 tablet  (20 mg total) by mouth daily. 30 tablet 1   Current Facility-Administered Medications on File Prior to Visit  Medication Dose Route Frequency Provider Last Rate Last Dose  . apixaban (ELIQUIS) tablet 5 mg  5 mg Oral BID Kirsteins, Victorino Sparrow, MD        Allergies:  No Known Allergies  Physical Exam General: Frail pleasant elderly African-American female, seated, in no evident distress Head: head normocephalic and atraumatic.  Neck: supple with no carotid or supraclavicular bruits Cardiovascular: regular rate and rhythm, no murmurs Musculoskeletal: no deformity Skin:  no rash/petichiae Vascular:  Normal pulses all extremities Vitals:   09/12/17 0952  BP: 123/81  Pulse: 67   Neurologic Exam Mental Status: Awake and fully alert. Oriented to place and time. Remote memory intact (is on Aricept by PCP). Attention span, concentration and fund of knowledge appropriate. Mood and affect appropriate. Mild expressive aphasia with nonfluent speech with word finding difficulties. No paraphasias. Good comprehension. Nodysarthria Cranial Nerves: Fundoscopic exam reveals sharp disc margins. Pupils equal, briskly reactive to light. Extraocular movements full without nystagmus. Visual fields full to confrontation. Hearing intact. Facial sensation intact. And mild right nasolabial fold asymmetry., tongue, palate moves normally and symmetrically.  Motor: Normal bulk and tone. Normal strength in all tested extremity muscles except for minimal weakness on right upper and lower extremity Sensory.: intact to touch ,pinprick .position and vibratory sensation.  Coordination: Rapid alternating movements normal in all extremities. Finger-to-nose and heel-to-shin performed accurately bilaterally.  Decreased right hand dexterity Gait and Station: Arises from chair without difficulty. Stance is normal. Gait demonstrates normal stride length and balance . Able to heel, toe and tandem walk without difficulty.  Reflexes: 1+  and symmetric. Toes downgoing.     ASSESSMENT: 45 year [name] [name] will embolic left MCA branch infarcts in February 2019 secondary to atrial fibrillation while not on anticoagulation. Vascular risk factors of atrial fibrillation, hypertension and hyperlipidemia.  [name] [name] is being seen today for follow-up visit and overall continues to have improvement regarding right hemiparesis and expressive aphasia.   PLAN: -Continue Eliquis (apixaban) daily  and Lipitor for secondary stroke prevention -F/u with PCP regarding your HLD and HTN management -f/u with Dr. Jodean Lima today as scheduled -[name] [name] believes [name] [name] had a cardiologist appointment today but sister was advised that this appointment is with Dr. Clementeen Hoof from physical medicine and address of office was provided -continue to monitor BP at home -Continue to participate in PT/OT/ST and  advised of additional orders are needed to notify us -Advised to continue to stay active and maintain a healthy diet -Maintain strict control of hypertension with blood pressure goal below 130/90, diabetes with hemoglobin A1c goal below 6.5% and cholesterol with LDL cholesterol (bad cholesterol) goal below 70 mg/dL. I also advised the [name] [name] to eat a healthy diet with plenty of whole grains, cereals, fruits and vegetables, exercise regularly and maintain ideal body weight.  Follow up in 6 months or call earlier if needed  Greater than 50% of time during this 25 minute visit was spent on counseling,explanation of diagnosis of embolic stroke, planning of further management, discussion with [name] [name] and family and coordination of care  George Hugh, [facility]  [facility] [address] [place], [place] [address]  Phone [phone] Fax [phone] Note: This document was prepared with digital dictation and possible smart phrase technology. Any transcriptional errors that result from this process are unintentional.

## 2017-09-12 ENCOUNTER — Encounter: Payer: Self-pay | Admitting: Adult Health

## 2017-09-12 ENCOUNTER — Encounter: Payer: Self-pay | Admitting: Physical Medicine & Rehabilitation

## 2017-09-12 ENCOUNTER — Encounter: Payer: Medicare Other | Attending: Physical Medicine & Rehabilitation

## 2017-09-12 ENCOUNTER — Other Ambulatory Visit: Payer: Self-pay

## 2017-09-12 ENCOUNTER — Ambulatory Visit (HOSPITAL_BASED_OUTPATIENT_CLINIC_OR_DEPARTMENT_OTHER): Payer: Medicare Other | Admitting: Physical Medicine & Rehabilitation

## 2017-09-12 ENCOUNTER — Ambulatory Visit (INDEPENDENT_AMBULATORY_CARE_PROVIDER_SITE_OTHER): Payer: Medicare Other | Admitting: Adult Health

## 2017-09-12 VITALS — BP 137/72 | HR 66 | Ht 60.0 in | Wt 87.0 lb

## 2017-09-12 VITALS — BP 123/81 | HR 67 | Wt 86.4 lb

## 2017-09-12 DIAGNOSIS — E785 Hyperlipidemia, unspecified: Secondary | ICD-10-CM

## 2017-09-12 DIAGNOSIS — I63512 Cerebral infarction due to unspecified occlusion or stenosis of left middle cerebral artery: Secondary | ICD-10-CM | POA: Diagnosis not present

## 2017-09-12 DIAGNOSIS — I69320 Aphasia following cerebral infarction: Secondary | ICD-10-CM | POA: Diagnosis not present

## 2017-09-12 DIAGNOSIS — I69351 Hemiplegia and hemiparesis following cerebral infarction affecting right dominant side: Secondary | ICD-10-CM | POA: Diagnosis present

## 2017-09-12 DIAGNOSIS — I10 Essential (primary) hypertension: Secondary | ICD-10-CM

## 2017-09-12 DIAGNOSIS — I480 Paroxysmal atrial fibrillation: Secondary | ICD-10-CM

## 2017-09-12 NOTE — Progress Notes (Signed)
Subjective:    Patient ID: [name] [name], female    DOB: 1951/01/26, 67 y.o.   MRN: [id number]  HPI 67 year old right-handed female, history of tobacco abuse, CVA in 2014 with residual right-sided weakness, status post loop recorder placement, maintained on Eliquis, received inpatient rehab services, PAF, CAD with CABG, history of dementia.  Lives with her boyfriend of 20 years who works 12-hour shifts.  Reported to be independent prior to admission.  Presented on March 10, 2017, after acute left MCA infarction.  Noted to have slurred speech, facial droop.  Symptoms appeared to have started on March 09, 2017, when she felt right-sided weakness after a fall.  In the ED, x-rays of the right hand were taken that showed fracture of the right proximal 5th phalanx.  CT of the head unremarkable.  MRI showed left MCA, PCA infarction and patchy small acute ischemic nonhemorrhagic left MCA infarctions.  The patient did not receive tPA.  Neurology consulted for workup.  Echocardiogram with ejection fraction of 60%, no wall motion abnormalities.  Tolerating a regular diet.  Underwent closed reduction and splinting for displaced fracture of the proximal phalanx on the right little finger, nonweightbearing.   HHPT, OT, SLP still coming out, since early April Getting local bus transport   Lives alone but has friends that visit everyday and stay "most of day"  Patient ambulates without assistive device, she is independent with dressing and bathing she does some limited cooking.  Pain Inventory Average Pain 5 Pain Right Now 5 My pain is dull and aching  In the last 24 hours, has pain interfered with the following? General activity 0 Relation with others 0 Enjoyment of life 0 What TIME of day is your pain at its worst? night Sleep (in general) Good  Pain is worse with: unsure Pain improves with: rest and therapy/exercise Relief from Meds: 5  Mobility walk without assistance how  many minutes can you walk? 30 ability to climb steps?  yes do you drive?  no  Function disabled: date disabled . I need assistance with the following:  meal prep, household duties and shopping  Neuro/Psych weakness  Prior Studies Any changes since last visit?  no  Physicians involved in your care Any changes since last visit?  no   Family History  Problem Relation Age of Onset  . Hypertension Mother   . Stroke Brother        Died at age 35 secondary to MI  . CAD Brother    Social History   Socioeconomic History  . Marital status: Divorced    Spouse name: Not on file  . Number of children: 1  . Years of education: college  . Highest education level: Not on file  Occupational History  . Occupation: retired  Engineer, production  . Financial resource strain: Not on file  . Food insecurity:    Worry: Not on file    Inability: Not on file  . Transportation needs:    Medical: Not on file    Non-medical: Not on file  Tobacco Use  . Smoking status: Current Every Day Smoker    Packs/day: 0.50    Years: 33.00    Pack years: 16.50    Types: Cigarettes    Start date: 08/25/2012  . Smokeless tobacco: Never Used  Substance and Sexual Activity  . Alcohol use: No  . Drug use: No  . Sexual activity: Never  Lifestyle  . Physical activity:    Days per week: Not  on file    Minutes per session: Not on file  . Stress: Not on file  Relationships  . Social connections:    Talks on phone: Not on file    Gets together: Not on file    Attends religious service: Not on file    Active member of club or organization: Not on file    Attends meetings of clubs or organizations: Not on file    Relationship status: Not on file  Other Topics Concern  . Not on file  Social History Narrative  . Not on file   Past Surgical History:  Procedure Laterality Date  . CARDIAC CATHETERIZATION    . CESAREAN SECTION  X 1  . CORONARY ARTERY BYPASS GRAFT N/A 07/25/2012   Procedure: CORONARY ARTERY  BYPASS GRAFTING (CABG) times five with Left  Endoscopic Saphenous Vein Harvest and Left Internal  Mammary ;  Surgeon: Loreli Slot, MD;  Location: [facility] OR;  Service: Open Heart Surgery;  Laterality: N/A;  . DILATION AND CURETTAGE OF UTERUS    . 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 LEFT HEART CATHETERIZATION WITH CORONARY ANGIOGRAM N/A 07/21/2012   Procedure: LEFT HEART CATHETERIZATION WITH CORONARY ANGIOGRAM;  Surgeon: [name], MD;  Location: [facility] CATH LAB;  Service: Cardiovascular;  Laterality: N/A;  . LOOP RECORDER IMPLANT  08-15-2012   Medtronic LinQ implanted by Dr Johney Frame for cryptogenic stroke  . TEE WITHOUT CARDIOVERSION N/A 08/15/2012   Procedure: TRANSESOPHAGEAL ECHOCARDIOGRAM (TEE);  Surgeon: Wendall Stade, MD;  Location: [facility] ENDOSCOPY;  Service: Cardiovascular;  Laterality: N/A;  . TUBAL LIGATION     Past Medical History:  Diagnosis Date  . Acute ischemic left MCA stroke (HCC) 03/11/2017  . Hyperlipidemia LDL goal < 70 07/24/2012  . Hypertension   . 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 07/25/12 07/28/2012   POSTOPERATIVE DIAGNOSIS: Severe 3-vessel coronary disease, status post  myocardial infarction.  PROCEDURE: Median sternotomy, extracorporeal circulation, coronary  artery bypass grafting x5 (left internal mammary artery to LAD,  sequential saphenous vein graft to obtuse marginal 1, obtuse marginal 2,  and posterior descending with Y graft to 1st diagonal), endoscopic vein  harvest, left thigh.    . Seizures (HCC)    "years ago" (03/11/2017)  . STEMI (ST elevation myocardial infarction), secondary to disease of RCA, with disease of LAD and diag. branch and chronic non dominant LCX occlusion for CABG  07/21/2012  . Stroke [facility]) ?08/2012   "right sided weakness; speech isssues since" (03/11/2017)  . Tobacco abuse 07/21/2012   BP 137/72   Pulse 66   Ht 5' (1.524 m)   Wt 87 lb (39.5 kg)   SpO2 90%   BMI 16.99 kg/m   Opioid Risk Score:   Fall Risk  Score:  `1  Depression screen PHQ 2/9  Depression screen PHQ 2/9 09/12/2017  Decreased Interest 0  Down, Depressed, Hopeless 0  PHQ - 2 Score 0    Review of Systems  Constitutional: Positive for unexpected weight change.  HENT: Negative.   Eyes: Negative.   Respiratory: Positive for cough.   Cardiovascular: Negative.   Gastrointestinal: Negative.   Endocrine:       High blood sugars  Genitourinary: Negative.   Musculoskeletal: Negative.   Skin: Negative.   Allergic/Immunologic: Negative.   Neurological: Positive for weakness.  Hematological: Negative.   Psychiatric/Behavioral: Negative.   All other systems reviewed and are negative.      Objective:   Physical Exam  Constitutional: She appears well-developed and well-nourished. No distress.  HENT:  Head: Normocephalic.  Eyes: Pupils are equal, round, and reactive to light. EOM are normal.  Neck: Normal range of motion.  Skin: She is not diaphoretic.  Nursing note and vitals reviewed.  Motor strength is 4+ bilateral deltoid bicep tricep grip 5/5 bilateral hip flexor knee extensor ankle dorsiflexor Speech remains aphasic, she has difficulty naming more complex objects such as stethoscope but is able to name simple objects such as ring and pen  Gait is without evidence of toe drag or knee instability. Mood and affect are appropriate     Assessment & Plan:  1.  History of left MCA distribution infarct with right hemiparesis, excellent recovery overall.  Her main residual deficit is aphasia.  She is completing home health therapies.  I do not think she needs any further OT PT as an outpatient would recommend outpatient speech therapy.  She is pursuing scat transportation. MD follow-up in 3 months

## 2017-09-12 NOTE — Patient Instructions (Addendum)
Continue Eliquis (apixaban) daily  and lipitor  for secondary stroke prevention  Continue to follow up with PCP regarding cholesterol and blood pressure management   Continue current therapies with PT/OT/ST  Continue to monitor blood pressure at home  Continue to stay active and maintain a healthy diet  Appointment today at 11:00am  Dr. Claudette Laws Pain medicine - interventional pain medicine [address], [place], [place] [address] [phone]  Maintain strict control of hypertension with blood pressure goal below 130/90, diabetes with hemoglobin A1c goal below 6.5% and cholesterol with LDL cholesterol (bad cholesterol) goal below 70 mg/dL. I also advised the patient to eat a healthy diet with plenty of whole grains, cereals, fruits and vegetables, exercise regularly and maintain ideal body weight.  Followup in the future with me in 6 months or call earlier if needed       Thank you for coming to see [place] at [facility]. I hope we have been able to provide you high quality care today.  You may receive a patient satisfaction survey over the next few weeks. We would appreciate your feedback and comments so that we may continue to improve ourselves and the health of our patients.

## 2017-09-13 ENCOUNTER — Other Ambulatory Visit: Payer: Self-pay | Admitting: Family Medicine

## 2017-09-13 DIAGNOSIS — E2839 Other primary ovarian failure: Secondary | ICD-10-CM

## 2017-09-13 DIAGNOSIS — Z1231 Encounter for screening mammogram for malignant neoplasm of breast: Secondary | ICD-10-CM

## 2017-09-15 ENCOUNTER — Telehealth: Payer: Self-pay | Admitting: *Deleted

## 2017-09-15 DIAGNOSIS — Z122 Encounter for screening for malignant neoplasm of respiratory organs: Secondary | ICD-10-CM

## 2017-09-15 NOTE — Telephone Encounter (Signed)
Received a referral for initial lung cancer screening scan.  Contacted the patient and obtained their smoking history, current smoker 1 ppd since 67 years old with [id number]  as well as answering questions related to screening process.  Patient denies signs of lung cancer such as weight loss or hemoptysis at this time.  Patient denies comorbidity that would prevent curative treatment if lung cancer were found.  Patient is scheduled for the Shared Decision Making Visit and CT scan on 09-27-17 @1345 .

## 2017-09-15 NOTE — Progress Notes (Signed)
I agree with the above plan 

## 2017-09-15 NOTE — Telephone Encounter (Signed)
Received referral for low dose lung cancer screening CT scan.  Unable to leave message at this time, will attempt call at later point.       

## 2017-09-26 ENCOUNTER — Telehealth: Payer: Self-pay | Admitting: *Deleted

## 2017-09-26 NOTE — Telephone Encounter (Signed)
Unable to reach patient on phone number listed, sister [name] [name] called and reminded about patient's appt for 09-27-17 at 1345 for lung screening, voiced agreement and understanding, reported that she was bringing her to the appointment.

## 2017-09-27 ENCOUNTER — Inpatient Hospital Stay: Payer: Medicare Other | Attending: Oncology | Admitting: Oncology

## 2017-09-27 ENCOUNTER — Ambulatory Visit
Admission: RE | Admit: 2017-09-27 | Discharge: 2017-09-27 | Disposition: A | Discharge status: Home / Self Care | Payer: Medicare Other | Source: Ambulatory Visit | Attending: Nurse Practitioner | Admitting: Nurse Practitioner

## 2017-09-27 ENCOUNTER — Encounter: Payer: Self-pay | Admitting: Oncology

## 2017-09-27 DIAGNOSIS — I700 Atherosclerosis of aorta: Secondary | ICD-10-CM | POA: Diagnosis not present

## 2017-09-27 DIAGNOSIS — J439 Emphysema, unspecified: Secondary | ICD-10-CM | POA: Diagnosis not present

## 2017-09-27 DIAGNOSIS — Z87891 Personal history of nicotine dependence: Secondary | ICD-10-CM

## 2017-09-27 DIAGNOSIS — Z122 Encounter for screening for malignant neoplasm of respiratory organs: Secondary | ICD-10-CM | POA: Diagnosis not present

## 2017-09-28 NOTE — Progress Notes (Signed)
In accordance with CMS guidelines, patient has met eligibility criteria including age, absence of signs or symptoms of lung cancer.  Social History   Tobacco Use  . Smoking status: Current Every Day Smoker    Packs/day: 1.00    Years: 51.00    Pack years: 51.00    Types: Cigarettes    Start date: 08/25/2012  . Smokeless tobacco: Never Used  Substance Use Topics  . Alcohol use: No  . Drug use: No     A shared decision-making session was conducted prior to the performance of CT scan. This includes one or more decision aids, includes benefits and harms of screening, follow-up diagnostic testing, over-diagnosis, false positive rate, and total radiation exposure.  Counseling on the importance of adherence to annual lung cancer LDCT screening, impact of co-morbidities, and ability or willingness to undergo diagnosis and treatment is imperative for compliance of the program.  Counseling on the importance of continued smoking cessation for former smokers; the importance of smoking cessation for current smokers, and information about tobacco cessation interventions have been given to patient including [place] and [address] quit [place] programs.  Written order for lung cancer screening with LDCT has been given to the patient and any and all questions have been answered to the best of my abilities.   Yearly follow up will be coordinated by Burgess Estelle, Thoracic Navigator.  Faythe Casa, NP 09/28/2017 8:45 AM

## 2017-10-04 ENCOUNTER — Encounter: Payer: Self-pay | Admitting: *Deleted

## 2017-12-12 ENCOUNTER — Ambulatory Visit: Payer: Medicare Other | Admitting: Physical Medicine & Rehabilitation

## 2017-12-22 ENCOUNTER — Ambulatory Visit: Payer: Medicare Other | Admitting: Physical Medicine & Rehabilitation

## 2017-12-22 ENCOUNTER — Encounter: Payer: Medicare Other | Attending: Physical Medicine & Rehabilitation

## 2017-12-22 DIAGNOSIS — I69320 Aphasia following cerebral infarction: Secondary | ICD-10-CM | POA: Insufficient documentation

## 2017-12-22 DIAGNOSIS — I69351 Hemiplegia and hemiparesis following cerebral infarction affecting right dominant side: Secondary | ICD-10-CM | POA: Insufficient documentation

## 2018-03-15 ENCOUNTER — Ambulatory Visit: Payer: Medicare Other | Admitting: Adult Health

## 2018-05-30 ENCOUNTER — Telehealth: Payer: Self-pay

## 2018-05-30 NOTE — Telephone Encounter (Signed)
Left vm on Annette pts sister that appt will be cancel on 06/07/2018. I stated on vm that per JEssica NP she can follow up as needed. I also stated she needs to follow up with her primary doctor for medication management and routine health wellness visits. I stated appt will be cancel.

## 2018-06-05 ENCOUNTER — Ambulatory Visit: Payer: Medicare Other | Admitting: Adult Health

## 2018-09-11 ENCOUNTER — Telehealth: Payer: Self-pay | Admitting: *Deleted

## 2018-09-11 NOTE — Telephone Encounter (Signed)
Left message for patient on Sister's phone to notify them that it is time to schedule annual low dose lung cancer screening CT scan. Instructed patient to call back to verify information prior to the scan being scheduled.

## 2018-12-22 ENCOUNTER — Telehealth: Payer: Self-pay | Admitting: *Deleted

## 2018-12-22 NOTE — Telephone Encounter (Signed)
Left message for patient to notify them that it is time to schedule annual low dose lung cancer screening CT scan. Instructed patient to call back to verify information prior to the scan being scheduled.  

## 2019-01-09 ENCOUNTER — Encounter: Payer: Self-pay | Admitting: *Deleted

## 2019-04-10 IMAGING — DX DG HAND COMPLETE 3+V*R*
3 series · 3 of 3 positions shown · non-contrast
Comparison: None

CLINICAL DATA: Hand pain, history of stroke

EXAM:
RIGHT HAND - COMPLETE 3+ VIEW

[x hand pa right]
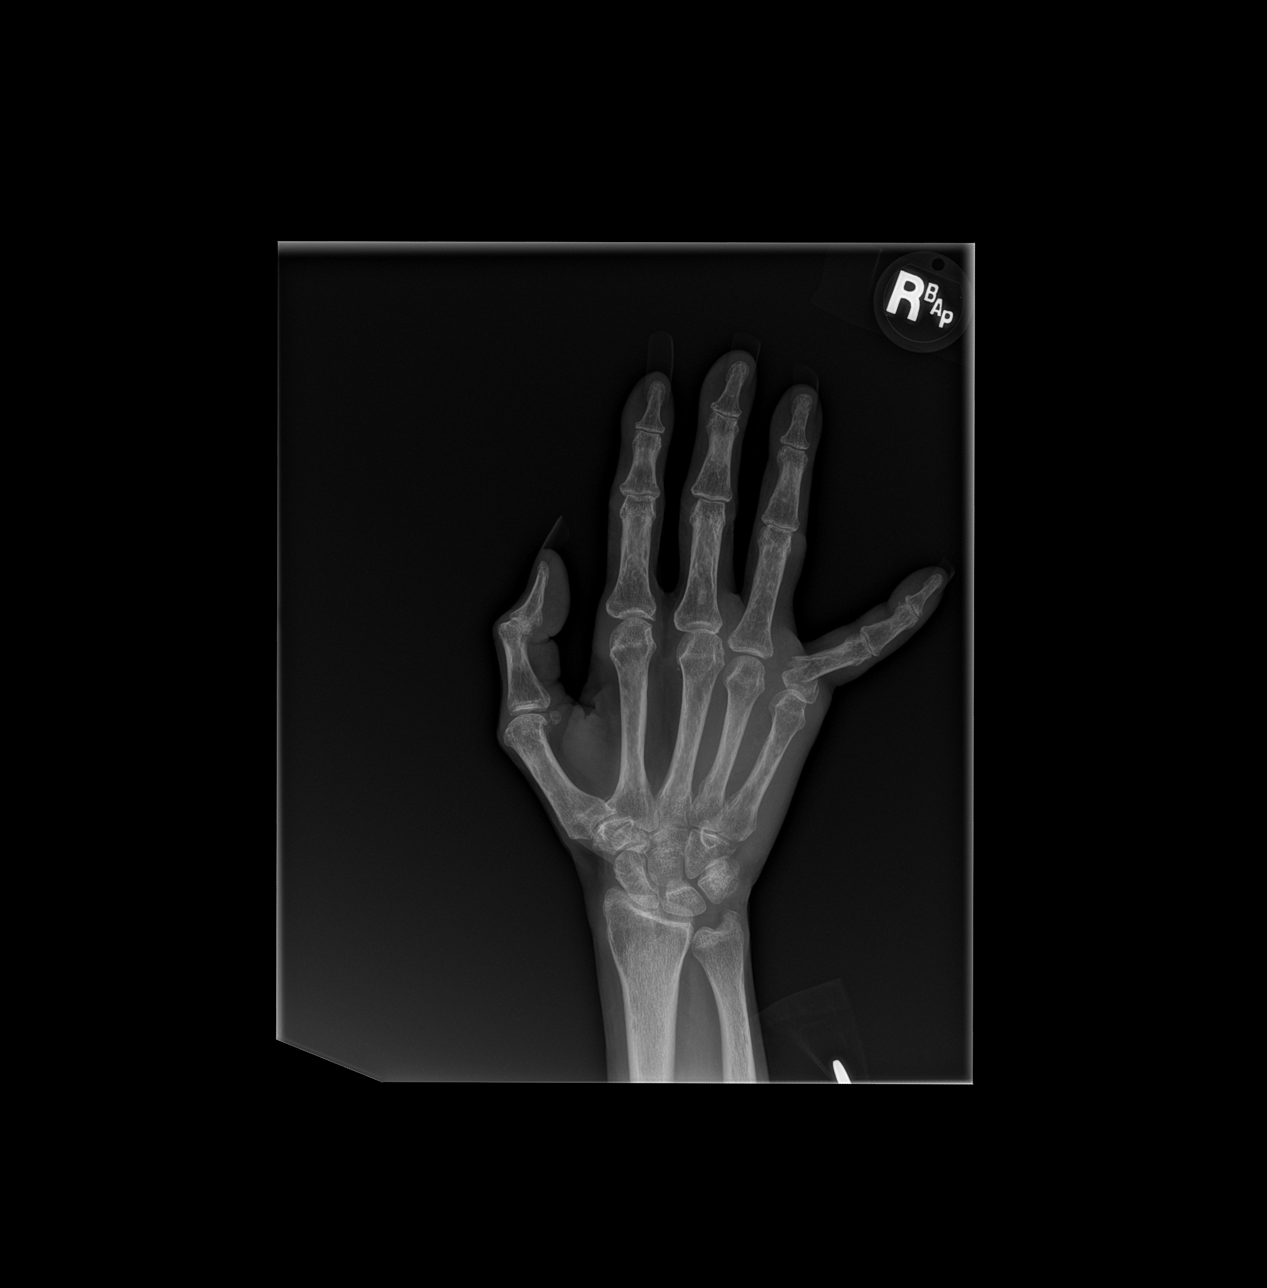

[x hand obl right]
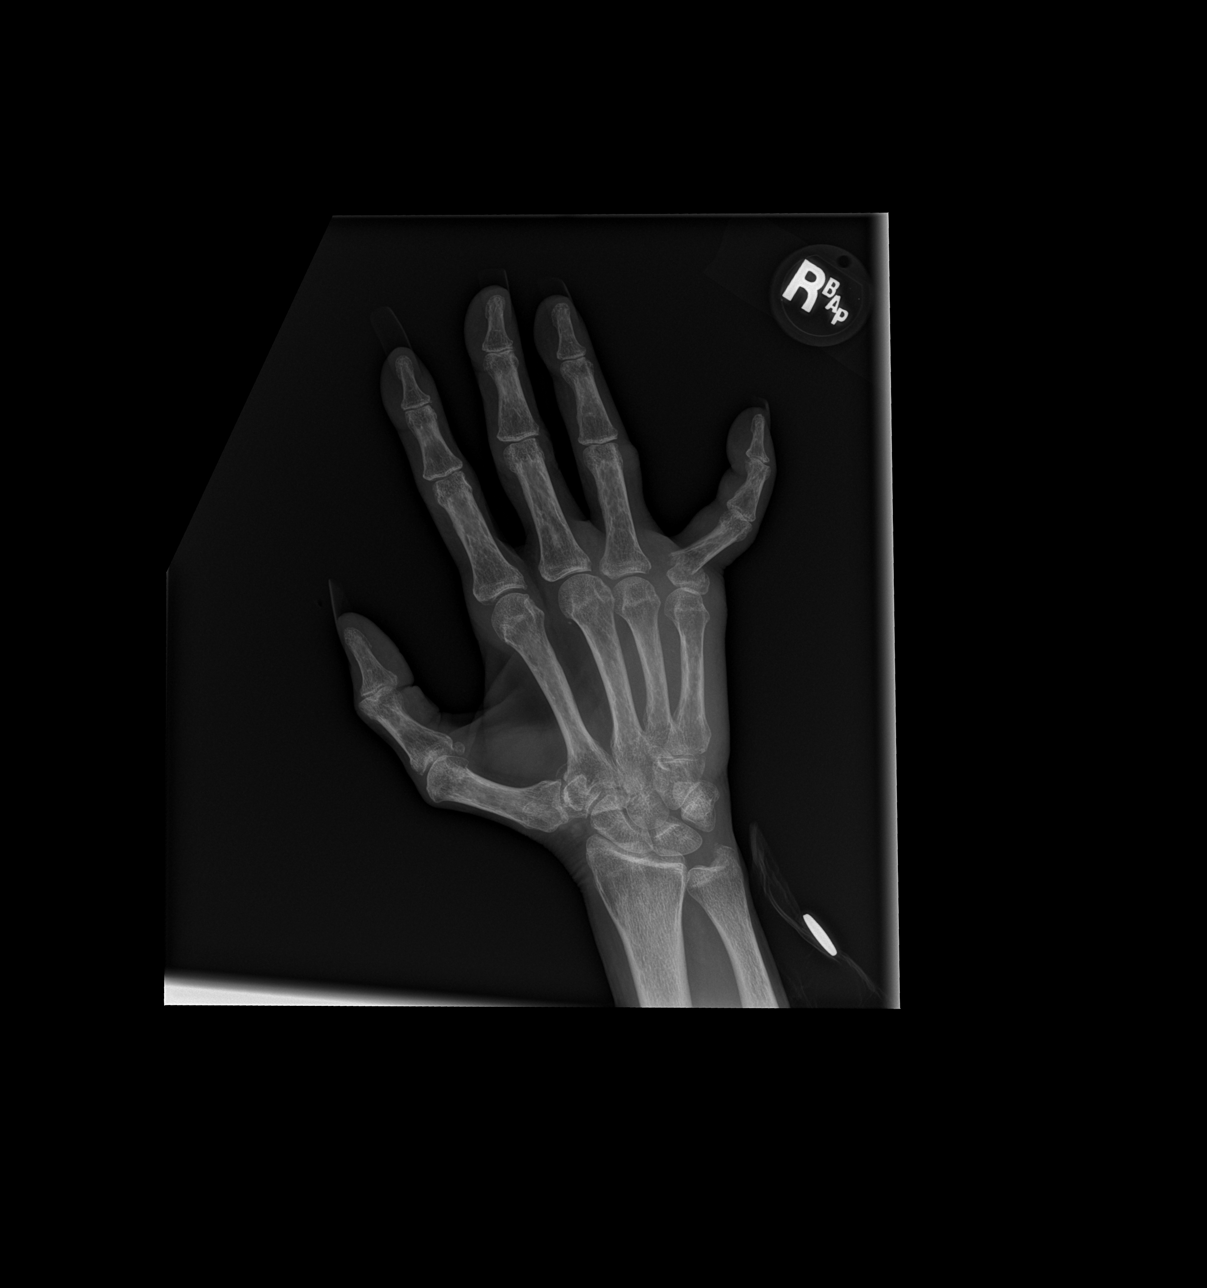

[x hand lat right]
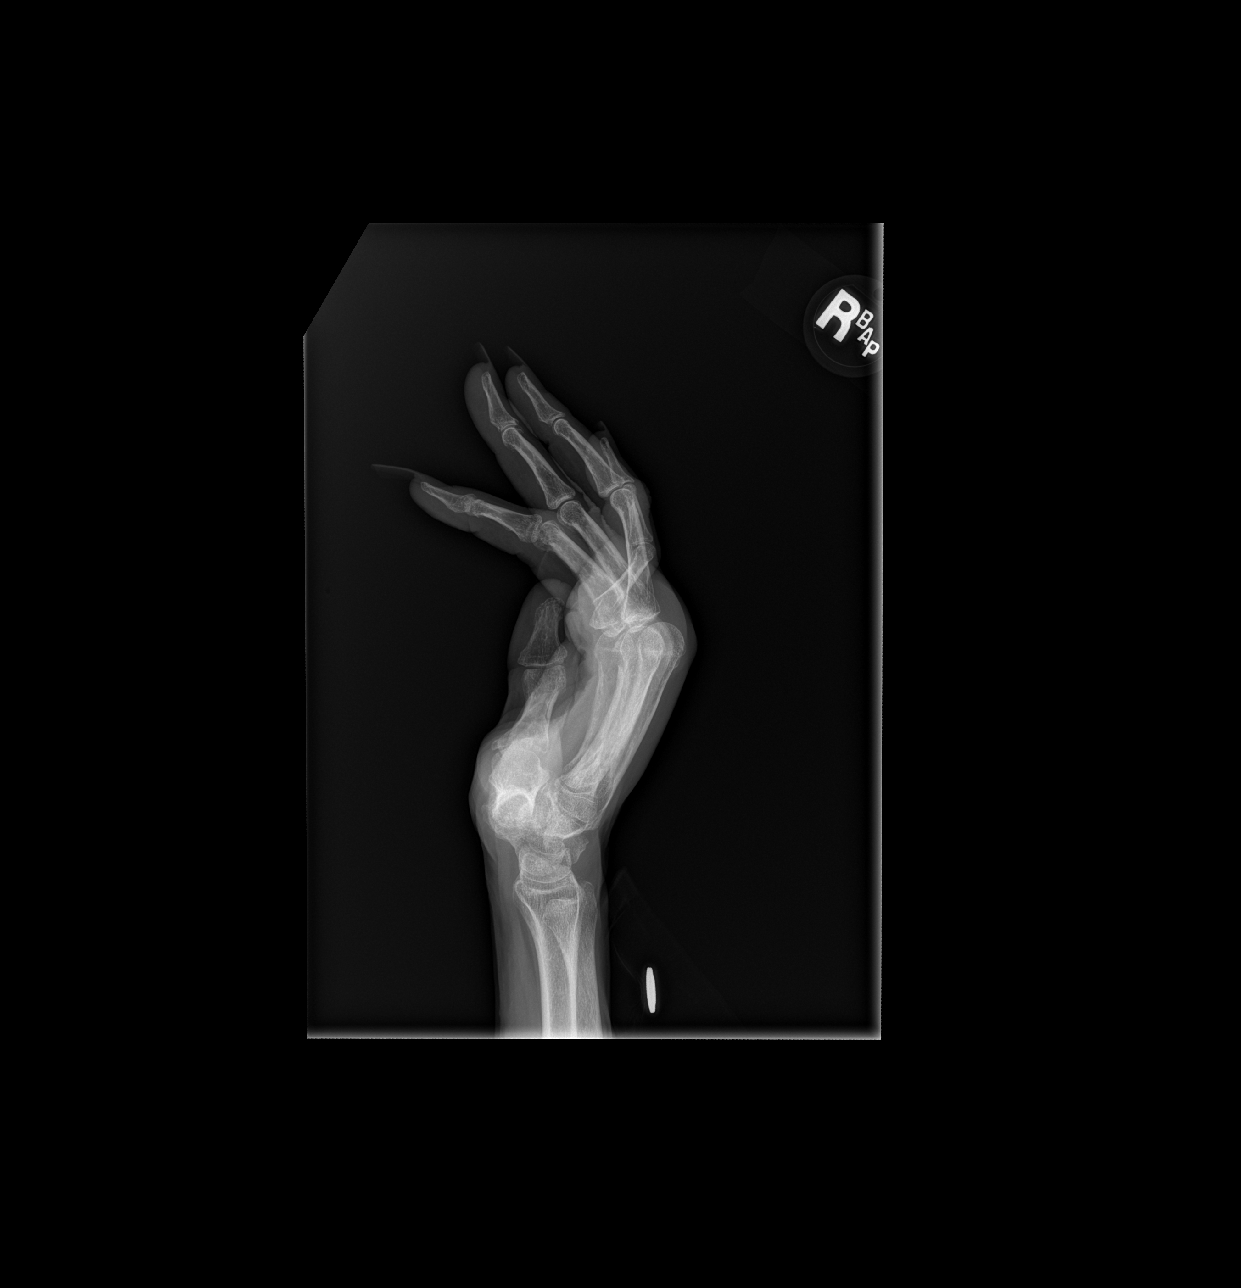

[3 of 3 positions shown; findings below may reference images not displayed]

FINDINGS: Diffuse osseous demineralization.

Transverse fracture at base of proximal phalanx RIGHT little finger
with displacement, angulation, and deformity.

Joint spaces preserved.

No additional fracture, dislocation, or bone destruction.

Dorsal soft tissue swelling overlying the MCP joints.
IMPRESSION: Displaced and angulated fracture at base of proximal phalanx RIGHT
little finger.

## 2019-04-24 ENCOUNTER — Encounter: Payer: Self-pay | Admitting: Cardiology

## 2019-04-24 ENCOUNTER — Telehealth (INDEPENDENT_AMBULATORY_CARE_PROVIDER_SITE_OTHER): Payer: Medicare Other | Admitting: Cardiology

## 2019-04-24 VITALS — Ht 60.0 in

## 2019-04-24 DIAGNOSIS — Z7901 Long term (current) use of anticoagulants: Secondary | ICD-10-CM | POA: Insufficient documentation

## 2019-04-24 DIAGNOSIS — R4701 Aphasia: Secondary | ICD-10-CM | POA: Diagnosis not present

## 2019-04-24 DIAGNOSIS — I69320 Aphasia following cerebral infarction: Secondary | ICD-10-CM

## 2019-04-24 DIAGNOSIS — I10 Essential (primary) hypertension: Secondary | ICD-10-CM

## 2019-04-24 DIAGNOSIS — Z951 Presence of aortocoronary bypass graft: Secondary | ICD-10-CM

## 2019-04-24 DIAGNOSIS — I252 Old myocardial infarction: Secondary | ICD-10-CM

## 2019-04-24 DIAGNOSIS — Z720 Tobacco use: Secondary | ICD-10-CM

## 2019-04-24 DIAGNOSIS — Z8673 Personal history of transient ischemic attack (TIA), and cerebral infarction without residual deficits: Secondary | ICD-10-CM

## 2019-04-24 DIAGNOSIS — I2111 ST elevation (STEMI) myocardial infarction involving right coronary artery: Secondary | ICD-10-CM

## 2019-04-24 DIAGNOSIS — I480 Paroxysmal atrial fibrillation: Secondary | ICD-10-CM

## 2019-04-24 NOTE — Progress Notes (Signed)
Virtual Visit via Telephone Note   This visit type was conducted due to national recommendations for restrictions regarding the COVID-19 Pandemic (e.g. social distancing) in an effort to limit this patient's exposure and mitigate transmission in our community.  Due to her co-morbid illnesses, this patient is at least at moderate risk for complications without adequate follow up.  This format is felt to be most appropriate for this patient at this time.  The patient did not have access to video technology/had technical difficulties with video requiring transitioning to audio format only (telephone).  All issues noted in this document were discussed and addressed.  No physical exam could be performed with this format.  Please refer to the patient's chart for her  consent to telehealth for [facility].   The patient was identified using 2 identifiers.  Date:  04/24/2019   ID:  [name] [name], DOB [birth year], MRN [id number]  Patient Location: Home Provider Location: Home  PCP:  Theotis Burrow, MD  Cardiologist:  Quay Burow, MD   Electrophysiologist:  None   Evaluation Performed:  Follow-Up Visit  Chief Complaint:  none  History of Present Illness:    [name] [name] is a 69 y.o. female with a history of coronary disease and stroke.  In June 2014 she had an inferior ST EMI.  This was followed by CABG x4.  In 2015 she was admitted with a stroke.  An implantable loop recorder eventually revealed atrial fibrillation and she was started on Eliquis in 2015.  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  She had been off Eliquis for about a year.  This was complicated by a fracture of her finger which required surgery.  She has residual expressive aphasia.  Echocardiogram showed preserved LV function with an EF of 60%.  She was last seen in the office in April 2019.   Since that visit she tells me she has done well.  No complaints of chest  pain or tachycardia.  She is compliant with Eliquis.  She is being followed by her PCP.   The patient does not have symptoms concerning for COVID-19 infection (fever, chills, cough, or new shortness of breath).    Past Medical History:  Diagnosis Date  . Acute ischemic left MCA stroke ([place]) 03/11/2017  . Hyperlipidemia LDL goal < 70 07/24/2012  . Hypertension   . 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 07/25/12 07/28/2012   POSTOPERATIVE DIAGNOSIS: Severe 3-vessel coronary disease, status post  myocardial infarction.  PROCEDURE: Median sternotomy, extracorporeal circulation, coronary  artery bypass grafting x5 (left internal mammary artery to LAD,  sequential saphenous vein graft to obtuse marginal 1, obtuse marginal 2,  and posterior descending with Y graft to 1st diagonal), endoscopic vein  harvest, left thigh.    . Seizures ([place])    "years ago" (03/11/2017)  . STEMI (ST elevation myocardial infarction), secondary to disease of RCA, with disease of LAD and diag. branch and chronic non dominant LCX occlusion for CABG  07/21/2012  . Stroke [facility]) ?08/2012   "right sided weakness; speech isssues since" (03/11/2017)  . Tobacco abuse 07/21/2012   Past Surgical History:  Procedure Laterality Date  . CARDIAC CATHETERIZATION    . CESAREAN SECTION  X 1  . CORONARY ARTERY BYPASS GRAFT N/A 07/25/2012   Procedure: CORONARY ARTERY BYPASS GRAFTING (CABG) times five with Left  Endoscopic Saphenous Vein Harvest and Left Internal  Mammary ;  Surgeon: Revonda Standard  Dorris Fetch, MD;  Location: [facility] OR;  Service: Open Heart Surgery;  Laterality: N/A;  . DILATION AND CURETTAGE OF UTERUS    . 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 LEFT HEART CATHETERIZATION WITH CORONARY ANGIOGRAM N/A 07/21/2012   Procedure: LEFT HEART CATHETERIZATION WITH CORONARY ANGIOGRAM;  Surgeon: [name], MD;  Location: [facility] CATH LAB;  Service: Cardiovascular;  Laterality: N/A;  . LOOP RECORDER IMPLANT  08-15-2012   Medtronic  LinQ implanted by Dr Johney Frame for cryptogenic stroke  . TEE WITHOUT CARDIOVERSION N/A 08/15/2012   Procedure: TRANSESOPHAGEAL ECHOCARDIOGRAM (TEE);  Surgeon: Wendall Stade, MD;  Location: [facility] ENDOSCOPY;  Service: Cardiovascular;  Laterality: N/A;  . TUBAL LIGATION       Current Meds  Medication Sig  . amLODipine (NORVASC) 10 MG tablet Take 1 tablet (10 mg total) by mouth daily.  Marland Kitchen apixaban (ELIQUIS) 5 MG TABS tablet Take 1 tablet (5 mg total) by mouth 2 (two) times daily.  Marland Kitchen atorvastatin (LIPITOR) 80 MG tablet Take 1 tablet (80 mg total) by mouth daily.  Marland Kitchen donepezil (ARICEPT) 5 MG tablet Take 1 tablet (5 mg total) by mouth at bedtime.  Marland Kitchen lisinopril (PRINIVIL,ZESTRIL) 20 MG tablet Take 1 tablet (20 mg total) by mouth daily.   Current Facility-Administered Medications for the 04/24/19 encounter (Telemedicine) with Abelino Derrick, PA-C  Medication  . apixaban (ELIQUIS) tablet 5 mg     Allergies:   Patient has no known allergies.   Social History   Tobacco Use  . Smoking status: Current Every Day Smoker    Packs/day: 1.00    Years: 51.00    Pack years: 51.00    Types: Cigarettes    Start date: 08/25/2012  . Smokeless tobacco: Never Used  Substance Use Topics  . Alcohol use: No  . Drug use: No     Family Hx: The patient's family history includes CAD in her brother; Hypertension in her mother; Stroke in her brother.  ROS:   Please see the history of present illness.    All other systems reviewed and are negative.   Prior CV studies:   The following studies were reviewed today: Echo06-May-2019  Labs/Other Tests and Data Reviewed:    EKG:  An ECG dated 03/11/2017 was personally reviewed today and demonstrated:  NSR-90- RBBB  Recent Labs: No results found for requested labs within last 8760 hours.   Recent Lipid Panel Lab Results  Component Value Date/Time   CHOL 99 03/11/2017 04:47 AM   TRIG 48 03/11/2017 04:47 AM   HDL 36 (L) 03/11/2017 04:47 AM   CHOLHDL 2.8 03/11/2017  04:47 AM   LDLCALC 53 03/11/2017 04:47 AM    Wt Readings from Last 3 Encounters:  09/27/17 90 lb (40.8 kg)  09/12/17 87 lb (39.5 kg)  09/12/17 86 lb 6.4 oz (39.2 kg)     Objective:    Vital Signs:  Ht 5' (1.524 m)   BMI 17.58 kg/m    VITAL SIGNS:  reviewed  ASSESSMENT & PLAN:    CAD- S/P CABG x 05 July 2012. No complaints of angina  H/O STEMI- STEMI June 2014 secondary to disease of RCA, with disease of LAD and diag. branch and chronic non dominant LCX occlusion-sent for CABG   H/O embolic stroke- 1610 and again 2017-06-06 (off Eliquis in Jun 06, 2017)  Expressive aphasia- Post CVA  PAF- Documented on loop recorder post CVA  Chronic anticoagulation-  CHADS VASC=5  Plan- I encouraged her to reconsider COVID vaccine.  I'll request labs from her PCP.  F/U in office in one year.     COVID-19 Education: The signs and symptoms of COVID-19 were discussed with the patient and how to seek care for testing (follow up with PCP or arrange E-visit).  The importance of social distancing was discussed today. The patient says she will not get the COVID vaccine.   Time:   Today, I have spent 20 minutes with the patient with telehealth technology discussing the above problems.     Medication Adjustments/Labs and Tests Ordered: Current medicines are reviewed at length with the patient today.  Concerns regarding medicines are outlined above.   Tests Ordered: No orders of the defined types were placed in this encounter.   Medication Changes: No orders of the defined types were placed in this encounter.   Follow Up:  In Person Dr Allyson Sabal one year  Signed, Corine Shelter, Cordelia Poche  04/24/2019 9:54 AM    [facility]

## 2019-04-24 NOTE — Patient Instructions (Signed)
Medication Instructions:  Your physician recommends that you continue on your current medications as directed. Please refer to the Current Medication list given to you today.  *If you need a refill on your cardiac medications before your next appointment, please call your pharmacy*   Follow-Up: At [facility], you and your health needs are our priority.  As part of our continuing mission to provide you with exceptional heart care, we have created designated Provider Care Teams.  These Care Teams include your primary Cardiologist (physician) and Advanced Practice Providers (APPs -  Physician Assistants and Nurse Practitioners) who all work together to provide you with the care you need, when you need it.  We recommend signing up for the patient portal called "MyChart".  Sign up information is provided on this After Visit Summary.  MyChart is used to connect with patients for Virtual Visits (Telemedicine).  Patients are able to view lab/test results, encounter notes, upcoming appointments, etc.  Non-urgent messages can be sent to your provider as well.   To learn more about what you can do with MyChart, go to [email or web address].    Your next appointment:   12 month(s)  The format for your next appointment:   In Person  Provider:   Nanetta Batty, MD   Other Instructions Please call 2 months in advance to schedule your follow-up appointment.

## 2019-07-17 ENCOUNTER — Telehealth: Payer: Self-pay | Admitting: *Deleted

## 2019-07-17 DIAGNOSIS — Z122 Encounter for screening for malignant neoplasm of respiratory organs: Secondary | ICD-10-CM

## 2019-07-17 DIAGNOSIS — Z87891 Personal history of nicotine dependence: Secondary | ICD-10-CM

## 2019-07-17 NOTE — Telephone Encounter (Signed)
Patient has been notified that annual lung cancer screening low dose CT scan is due currently or will be in near future. Confirmed that patient is within the age range of 55-77, and asymptomatic, (no signs or symptoms of lung cancer). Patient denies illness that would prevent curative treatment for lung cancer if found. Verified smoking history, (current, 52 pack year). The shared decision making visit was done 09/27/17. Patient is agreeable for CT scan being scheduled.

## 2019-07-25 ENCOUNTER — Ambulatory Visit: Admission: RE | Admit: 2019-07-25 | Payer: Medicare Other | Source: Ambulatory Visit

## 2019-08-15 ENCOUNTER — Telehealth: Payer: Self-pay

## 2019-08-15 NOTE — Telephone Encounter (Signed)
Contacted patient in an attempt to reschedule missed lung CT screening appointment.  Home number of [phone] is not set up to accept messages.  I called mobile number listed [phone] and this mobile number belongs to patient's sister.  She gave me patient's home number (same of [phone]).  I called again and again received no answer.

## 2020-05-19 ENCOUNTER — Telehealth: Payer: Self-pay | Admitting: *Deleted

## 2020-05-19 ENCOUNTER — Encounter: Payer: Self-pay | Admitting: *Deleted

## 2020-05-19 NOTE — Telephone Encounter (Signed)
Called patient regarding scheduling her annual lung screening CT Scan. Patient hung up on me. Will send letter in the mail.

## 2021-11-03 ENCOUNTER — Emergency Department (HOSPITAL_COMMUNITY): Payer: 59

## 2021-11-03 ENCOUNTER — Encounter (HOSPITAL_COMMUNITY): Payer: Self-pay | Admitting: Emergency Medicine

## 2021-11-03 ENCOUNTER — Observation Stay (HOSPITAL_COMMUNITY)
Admission: EM | Admit: 2021-11-03 | Discharge: 2021-11-05 | Disposition: A | Discharge status: Home / Self Care | Payer: 59 | Attending: Emergency Medicine | Admitting: Emergency Medicine

## 2021-11-03 ENCOUNTER — Other Ambulatory Visit: Payer: Self-pay

## 2021-11-03 DIAGNOSIS — I69320 Aphasia following cerebral infarction: Secondary | ICD-10-CM | POA: Diagnosis not present

## 2021-11-03 DIAGNOSIS — U071 COVID-19: Secondary | ICD-10-CM | POA: Insufficient documentation

## 2021-11-03 DIAGNOSIS — Z79899 Other long term (current) drug therapy: Secondary | ICD-10-CM | POA: Insufficient documentation

## 2021-11-03 DIAGNOSIS — Z950 Presence of cardiac pacemaker: Secondary | ICD-10-CM | POA: Insufficient documentation

## 2021-11-03 DIAGNOSIS — Z7901 Long term (current) use of anticoagulants: Secondary | ICD-10-CM | POA: Diagnosis not present

## 2021-11-03 DIAGNOSIS — I63512 Cerebral infarction due to unspecified occlusion or stenosis of left middle cerebral artery: Secondary | ICD-10-CM

## 2021-11-03 DIAGNOSIS — F17210 Nicotine dependence, cigarettes, uncomplicated: Secondary | ICD-10-CM | POA: Diagnosis not present

## 2021-11-03 DIAGNOSIS — R55 Syncope and collapse: Principal | ICD-10-CM

## 2021-11-03 DIAGNOSIS — I4891 Unspecified atrial fibrillation: Secondary | ICD-10-CM | POA: Diagnosis present

## 2021-11-03 DIAGNOSIS — I480 Paroxysmal atrial fibrillation: Secondary | ICD-10-CM | POA: Diagnosis not present

## 2021-11-03 DIAGNOSIS — M6281 Muscle weakness (generalized): Secondary | ICD-10-CM | POA: Diagnosis not present

## 2021-11-03 DIAGNOSIS — Z8673 Personal history of transient ischemic attack (TIA), and cerebral infarction without residual deficits: Secondary | ICD-10-CM | POA: Insufficient documentation

## 2021-11-03 DIAGNOSIS — I2510 Atherosclerotic heart disease of native coronary artery without angina pectoris: Secondary | ICD-10-CM | POA: Insufficient documentation

## 2021-11-03 DIAGNOSIS — R2681 Unsteadiness on feet: Secondary | ICD-10-CM | POA: Insufficient documentation

## 2021-11-03 DIAGNOSIS — E860 Dehydration: Secondary | ICD-10-CM | POA: Diagnosis not present

## 2021-11-03 DIAGNOSIS — I5022 Chronic systolic (congestive) heart failure: Secondary | ICD-10-CM

## 2021-11-03 DIAGNOSIS — R2689 Other abnormalities of gait and mobility: Secondary | ICD-10-CM | POA: Diagnosis not present

## 2021-11-03 DIAGNOSIS — I110 Hypertensive heart disease with heart failure: Secondary | ICD-10-CM | POA: Insufficient documentation

## 2021-11-03 DIAGNOSIS — Z951 Presence of aortocoronary bypass graft: Secondary | ICD-10-CM | POA: Insufficient documentation

## 2021-11-03 DIAGNOSIS — I5020 Unspecified systolic (congestive) heart failure: Secondary | ICD-10-CM

## 2021-11-03 DIAGNOSIS — D6869 Other thrombophilia: Secondary | ICD-10-CM | POA: Insufficient documentation

## 2021-11-03 LAB — COMPREHENSIVE METABOLIC PANEL
ALT: 19 U/L (ref 0–44)
AST: 33 U/L (ref 15–41)
Albumin: 3.6 g/dL (ref 3.5–5.0)
Alkaline Phosphatase: 53 U/L (ref 38–126)
Anion gap: 12 (ref 5–15)
BUN: 22 mg/dL (ref 8–23)
CO2: 22 mmol/L (ref 22–32)
Calcium: 9.5 mg/dL (ref 8.9–10.3)
Chloride: 107 mmol/L (ref 98–111)
Creatinine, Ser: 1.14 mg/dL — ABNORMAL HIGH (ref 0.44–1.00)
GFR, Estimated: 51 mL/min — ABNORMAL LOW (ref >60)
Glucose, Bld: 91 mg/dL (ref 70–99)
Potassium: 4.4 mmol/L (ref 3.5–5.1)
Sodium: 141 mmol/L (ref 135–145)
Total Bilirubin: 0.4 mg/dL (ref 0.3–1.2)
Total Protein: 7.3 g/dL (ref 6.5–8.1)

## 2021-11-03 LAB — URINALYSIS, ROUTINE W REFLEX MICROSCOPIC
Bacteria, UA: NONE SEEN
Bilirubin Urine: NEGATIVE
Glucose, UA: NEGATIVE mg/dL
Hgb urine dipstick: NEGATIVE
Ketones, ur: 5 mg/dL — AB
Leukocytes,Ua: NEGATIVE
Nitrite: NEGATIVE
Protein, ur: 30 mg/dL — AB
Specific Gravity, Urine: 1.019 (ref 1.005–1.030)
pH: 5 (ref 5.0–8.0)

## 2021-11-03 LAB — CBC WITH DIFFERENTIAL/PLATELET
Abs Immature Granulocytes: 0.02 10*3/uL (ref 0.00–0.07)
Basophils Absolute: 0 10*3/uL (ref 0.0–0.1)
Basophils Relative: 1 %
Eosinophils Absolute: 0 10*3/uL (ref 0.0–0.5)
Eosinophils Relative: 0 %
HCT: 50.1 % — ABNORMAL HIGH (ref 36.0–46.0)
Hemoglobin: 16.8 g/dL — ABNORMAL HIGH (ref 12.0–15.0)
Immature Granulocytes: 1 %
Lymphocytes Relative: 34 %
Lymphs Abs: 1.2 10*3/uL (ref 0.7–4.0)
MCH: 31.3 pg (ref 26.0–34.0)
MCHC: 33.5 g/dL (ref 30.0–36.0)
MCV: 93.5 fL (ref 80.0–100.0)
Monocytes Absolute: 0.3 10*3/uL (ref 0.1–1.0)
Monocytes Relative: 9 %
Neutro Abs: 2 10*3/uL (ref 1.7–7.7)
Neutrophils Relative %: 55 %
Platelets: 165 10*3/uL (ref 150–400)
RBC: 5.36 MIL/uL — ABNORMAL HIGH (ref 3.87–5.11)
RDW: 13.7 % (ref 11.5–15.5)
WBC: 3.5 10*3/uL — ABNORMAL LOW (ref 4.0–10.5)
nRBC: 0 % (ref 0.0–0.2)

## 2021-11-03 LAB — TROPONIN I (HIGH SENSITIVITY)
Troponin I (High Sensitivity): 8 ng/L (ref <18)
Troponin I (High Sensitivity): 7 ng/L (ref <18)

## 2021-11-03 LAB — SARS CORONAVIRUS 2 BY RT PCR: SARS Coronavirus 2 by RT PCR: POSITIVE — AB

## 2021-11-03 LAB — MAGNESIUM: Magnesium: 1.7 mg/dL (ref 1.7–2.4)

## 2021-11-03 MED ORDER — SODIUM CHLORIDE 0.9 % IV BOLUS
500.0000 mL | Freq: Once | INTRAVENOUS | Status: AC
  Administered 2021-11-03: 500 mL via INTRAVENOUS

## 2021-11-03 MED ORDER — DONEPEZIL HCL 5 MG PO TABS
5.0000 mg | ORAL_TABLET | Freq: Every day | ORAL | Status: DC
  Administered 2021-11-03 – 2021-11-04 (×2): 5 mg via ORAL
  Filled 2021-11-03 (×2): qty 1

## 2021-11-03 MED ORDER — METOPROLOL TARTRATE 25 MG PO TABS
25.0000 mg | ORAL_TABLET | Freq: Two times a day (BID) | ORAL | Status: DC
  Administered 2021-11-03 – 2021-11-05 (×4): 25 mg via ORAL
  Filled 2021-11-03 (×4): qty 1

## 2021-11-03 MED ORDER — DILTIAZEM HCL-DEXTROSE 125-5 MG/125ML-% IV SOLN (PREMIX)
5.0000 mg/h | INTRAVENOUS | Status: DC
  Filled 2021-11-03: qty 125

## 2021-11-03 MED ORDER — APIXABAN 5 MG PO TABS
5.0000 mg | ORAL_TABLET | Freq: Two times a day (BID) | ORAL | Status: DC
  Administered 2021-11-03 – 2021-11-05 (×4): 5 mg via ORAL
  Filled 2021-11-03 (×4): qty 1

## 2021-11-03 MED ORDER — AMLODIPINE BESYLATE 5 MG PO TABS
5.0000 mg | ORAL_TABLET | Freq: Every day | ORAL | Status: DC
  Administered 2021-11-04 – 2021-11-05 (×2): 5 mg via ORAL
  Filled 2021-11-03 (×2): qty 1

## 2021-11-03 MED ORDER — SODIUM CHLORIDE 0.9 % IV BOLUS
1000.0000 mL | Freq: Once | INTRAVENOUS | Status: AC
  Administered 2021-11-03: 1000 mL via INTRAVENOUS

## 2021-11-03 MED ORDER — MOLNUPIRAVIR EUA 200MG CAPSULE
4.0000 | ORAL_CAPSULE | Freq: Two times a day (BID) | ORAL | Status: DC
  Administered 2021-11-04 – 2021-11-05 (×4): 800 mg via ORAL
  Filled 2021-11-03: qty 4

## 2021-11-03 MED ORDER — ATORVASTATIN CALCIUM 80 MG PO TABS
80.0000 mg | ORAL_TABLET | Freq: Every day | ORAL | Status: DC
  Administered 2021-11-04 – 2021-11-05 (×2): 80 mg via ORAL
  Filled 2021-11-03: qty 2
  Filled 2021-11-03: qty 1

## 2021-11-03 NOTE — H&P (Shared)
Hospital Admission History and Physical Service Pager: [phone]  Patient name: [name] [name] Medical record number: [id number] Date of Birth: May 04, 1950 Age: 71 y.o. Gender: female  Primary Care Provider: Preston Fleeting, MD Consultants: None Code Status: Full Preferred Emergency Contact: Rodrigo Ran (significant other): [phone]  Chief Complaint: Syncope  Assessment and Plan: ELAH GALLMAN is a 71 y.o. female presenting with syncope. Differential for this patient's presentation of this includes episode of RVR with afib (most likely given PMH, presence of RVR in ED, quick resolution of symptoms), dehydration (likely present given orthostasis/lab values but would not completely explain syncope while sitting), ACS (less likely given no chest pain, reassuring EKG, flat troponins), PE (less likely given no O2 requirement or chest pain or pleuritic pain), other arrhythmia and stroke (less likely given neurological exam overall reassuring in context of previous stroke, on eliquis for afib).  * Atrial fibrillation with RVR (HCC) Persistent in ED, stable. Suspect transient decreased perfusion to brain with RVR that precipitated event while sitting. COVID+ could be etiology of episode of RVR. Not on rate controlling agent at home. Given patient is high risk with her comorbidities, will admit to med-tele attending Dr. Manson Passey for observation. -Metoprolol 25 mg BID for rate control -Echo to evaluate structural abnormalities, EF -Continue home eliquis 5 mg BID -Cardiac monitoring -Vitals with pulse oximetry  Syncope While seated. Did not have any fall or head injury. Neurological exam reassuring. Some prodromal symptoms of dizziness and headache prior. Suspect possibly multifactorial in etiology with dehydration worsened from COVID+ and onset of RVR.  -Defer head imaging at this time given no new Neurological deficits -Echo to assess for structural abnormalities  -Orthostatics in  the morning  -Encourage PO intake  -Continuous telemetry to assess for other arrhythmias  -OOB with assistance  Dehydration Drinks one pepsi throughout the day without any water intake. Orthostasis on history. Cr elevated to 1.14. Hgb 16.8 which could be secondary to volume contraction. Effects could have been exacerbated by COVID and could have contributed to current presentation. Will encourage PO intake and recheck CBC, BMP in the morning.  COVID-19 Stable. Symptom onset 2 days ago. No O2 requirement. Exam reassuring. Given comorbid conditions, will initiate molnupiravir 800 mg BID for 5 days. Droplet and contact precautions ordered.  Aphasia as late effect of cerebrovascular accident Stable and chronic deficit from previous CVA.  -Continue high-intensity lipitor.  -Obtain Hgb A1c and lipid panel for further risk stratification.  -Consult PT/OT/SLP -Continue home aricept that was previously started following her CVA    FEN/GI: Heart healthy diet, no fluids VTE Prophylaxis: Home eliquis 5 mg BID   Disposition: Med-tele   History of Present Illness:  [name] [name] is a 71 y.o. female presenting with syncope.   Reports that she was getting her hair done when she passed out. She was sitting down at the time. She endorses a headache and some dizziness prior to syncope. She reports that she "wasn't feeling good". Has been feeling tired recently- has been laying down more often. Feels like she has some difficulty finding her words. This is chronic for her since her last stroke.    Typically does feel dizzy when she gets up from a seated position. She does admit that she does not hydrate well with water- sometimes doesn't have any. Pepsi is her preferred drink- typically sips on one all day long.    Denies any current headache, dizziness, blurred vision. Still endorses some fatigue. Denies palpitations.  Denies any chest pain, SOB. Denies fevers. No N/V. No constipation or diarrhea. No  dysuria.    Denies being vaccinated against COVID-19.    In the ED, she was found to be in afib with RVR. She was not requiring O2 and overall well-appearing. Lab studies revealed COVID+, flat troponins, elevated Cr, and erythrocytosis. CXR negative.   Review Of Systems: Per HPI.   Pertinent Past Medical History: Paroxysmal Atrial fibrillation  Hx CVA, hx STEMI  HTN HLD  Seizures- many years ago, "I haven't had one in a long time" Remainder reviewed in history tab.    Pertinent Past Surgical History: CABG x5  Tubal ligation Previous c-section Remainder reviewed in history tab.    Pertinent Social History: Tobacco use: 0.5 pack per day. Smoking since her 72s  Alcohol use: None recently  Other Substance use: Denies Lives with her partner of 36 years    Pertinent Family History: Heart disease in "all of them" Remainder reviewed in history tab.    Important Outpatient Medications: Eliquis  She is unable to name her other medications Remainder reviewed in medication history.   Objective: BP 112/73   Pulse 97   Temp 99 F (37.2 C) (Oral)   Resp (!) 22   SpO2 99%  Exam: General: Alert, in NAD, disheveled  Skin: Warm, dry, and intact without lesions HEENT: NCAT, EOM grossly normal, midline nasal septum Cardiac: Tachycardic, irregular rate and rhythm, no m/r/g appreciated Respiratory: CTAB, breathing and speaking comfortably on RA Abdominal: Soft, nontender, nondistended, bowel sounds not appreciated Neurological: Strength 3/5 on R side and 4/5 on L side in upper and lower extremities bilaterally, aphasia present throughout encounter, no facial droop Psychiatric: Tangential speech  Labs:  CBC BMET  Recent Labs  Lab 11/03/21 1046  WBC 3.5*  HGB 16.8*  HCT 50.1*  PLT 165   Recent Labs  Lab 11/03/21 1046  NA 141  K 4.4  CL 107  CO2 22  BUN 22  CREATININE 1.14*  GLUCOSE 91  CALCIUM 9.5     COVID+ Troponins 8>7 UA with ketones 5 and protein 30  EKG:  afib with 122 BPM, RBBB present, no STEMI  Imaging Studies Performed:  CXR: no active disease.  Ethelene Hal, MD 11/04/2021, 12:12 AM PGY-1, [place] Intern pager: [phone], text pages welcome Secure chat group [place] Upper-Level Resident Addendum   I have independently interviewed and examined the patient. I have discussed the above with the original author and agree with their documentation. My edits for correction/addition/clarification are included where appropriate. Please see also any attending notes.   Sharion Settler, DO PGY-3, [facility] 11/04/2021 1:03 AM  FPTS Service pager: [phone] (text pages welcome through [facility])

## 2021-11-03 NOTE — ED Provider Notes (Signed)
3:29 PM Care has significant contact Naasz.  At time of transfer of care, patient is waiting for results of urinalysis due to reportedly foul-smelling urine and this intermittent altered mental status and near syncope.  Patient was found be COVID-positive and they suspect symptoms are related to this.  Previously reports patient is otherwise well-appearing and may be safe for discharge home if work-up otherwise remains reassuring.  Anticipate shared decision-making conversation after urinalysis completed to determine disposition.  Patient is on room air without hypoxia.  8:04 PM Patient's urinalysis returned and does not show evidence of UTI however I went to check on the patient and she appears to be in A-fib with RVR.  Patient says she has had palpitations intermittent chest pain this week and has felt more tired.  Patient was found to have COVID-19.  We will give her some fluids and started on diltiazem drip to slow her down but we will call for admission given his new arrhythmia in the setting of COVID with intermittent chest pain and a syncopal episode.   Chart review was able to find that she does have a history of A-fib in the past but this appears to be new RVR and I am concerned about her syncopal episode in the setting of acute COVID.  Spoke with admitting team and will defer to them if they want to get CT imaging of her chest to rule out PE given her lack of current chest pain or shortness of breath.  Patient will be admitted for further management.   Clinical Impression: Covid 19 Syncope  Disposition: Admit  This note was prepared with assistance of Dragon voice recognition software. Occasional wrong-word or sound-a-like substitutions may have occurred due to the inherent limitations of voice recognition software.     Etheridge Geil, Gwenyth Allegra, MD 11/04/21 [phone]

## 2021-11-03 NOTE — ED Triage Notes (Signed)
Pt BIB [facility] for syncope at [facility], pt was seated and went unresponsive. Hx of stroke. On EMS arrival pt was alert and confused. Denies hitting head, denies fall. Afib on monitor. Incontinent of urine during episode. Per EMS stroke screen negative. R sided deficit from previous stroke. CBG 144. Absent radial pulses and faint carotids with fire. EMS reports faint radials. 98/70 manual initially. Last 108/72. HR 54-140, avg 70-90.

## 2021-11-03 NOTE — ED Provider Notes (Signed)
[facility] EMERGENCY DEPARTMENT Provider Note   CSN: [id number] Arrival date & time: 11/03/21  1010     History  Chief Complaint  Patient presents with   Loss of Consciousness    [name] [name] is a 71 y.o. female with HTN, paroxysmal A-fib on eliquis, history of STEMI 2014, status post CABG x5, history of seizure, history of CVA, tobacco abuse presents with LOC.  Patient was getting her hair done when she acutely lost consciousness.  She was sitting in the chair did not fall on the ground or hit her head.  She did not experience any prodromal symptoms such as lightheadedness, blurry/black vision, chest pain, palpitations, shortness of breath, nausea vomiting diarrhea constipation.  She has not had any fevers chills or recent illnesses.  She does note that she has had periodic chest pain this week that she does not notice is exertional, as well as some cough.  She has not had any swelling in her legs, recent hospitalizations/travel/surgeries. She states she feels generally weak now but otherwise with no fevers/chils, CP/SOB/palpitations/lightheadedness.  Has a h/o of CVA w/ residual RUE weakness but denies new numbness/tingling/asymmetric weakness.  Patient takes Eliquis for paroxysmal A-fib. Patient also reports foul-smelling urine w/ no hematuria and wonders if she has a UTI.   Loss of Consciousness      Home Medications Prior to Admission medications   Medication Sig Start Date End Date Taking? Authorizing Provider  amLODipine (NORVASC) 10 MG tablet Take 1 tablet (10 mg total) by mouth daily. 03/21/17   Angiulli, Mcarthur Rossetti, PA-C  apixaban (ELIQUIS) 5 MG TABS tablet Take 1 tablet (5 mg total) by mouth 2 (two) times daily. 03/21/17   Angiulli, Mcarthur Rossetti, PA-C  atorvastatin (LIPITOR) 80 MG tablet Take 1 tablet (80 mg total) by mouth daily. 03/21/17   Angiulli, Mcarthur Rossetti, PA-C  donepezil (ARICEPT) 5 MG tablet Take 1 tablet (5 mg total) by mouth at bedtime. 03/21/17    Angiulli, Mcarthur Rossetti, PA-C  lisinopril (PRINIVIL,ZESTRIL) 20 MG tablet Take 1 tablet (20 mg total) by mouth daily. 03/21/17   Angiulli, Mcarthur Rossetti, PA-C      Allergies    Patient has no known allergies.    Review of Systems   Review of Systems  Cardiovascular:  Positive for syncope.   Review of systems negative for f/c.  A 10 point review of systems was performed and is negative unless otherwise reported in HPI.  Physical Exam Updated Vital Signs BP (!) 105/51   Pulse (!) 117   Temp 99.8 F (37.7 C) (Oral)   Resp (!) 23   SpO2 100%  Physical Exam General: Thin- appearing female, lying in bed.  HEENT: PERRLA, Sclera anicteric, MMM, trachea midline. Cardiology: Tachycardic rate, irregular rhythm, no murmurs/rubs/gallops. BL radial and DP pulses equal bilaterally.  Resp: Normal respiratory rate and effort. CTAB, no wheezes, rhonchi, crackles.  Abd: Soft, non-tender, non-distended. No rebound tenderness or guarding.  GU: Deferred. MSK: No peripheral edema or signs of trauma. Extremities without deformity or TTP. No cyanosis or clubbing. Skin: warm, dry. No rashes or lesions. Neuro: A&Ox4, CNs II-XII grossly intact. MAEs. Sensation grossly intact.  Psych: Normal mood and affect.   ED Results / Procedures / Treatments   Labs (all labs ordered are listed, but only abnormal results are displayed) Labs Reviewed  SARS CORONAVIRUS 2 BY RT PCR  CBC WITH DIFFERENTIAL/PLATELET  COMPREHENSIVE METABOLIC PANEL  MAGNESIUM  URINALYSIS, ROUTINE W REFLEX MICROSCOPIC  TROPONIN I (HIGH  SENSITIVITY)    EKG EKG Interpretation  Date/Time:  Tuesday November 03 2021 10:18:24 EDT Ventricular Rate:  122 PR Interval:    QRS Duration: 115 QT Interval:  355 QTC Calculation: 506 R Axis:   -51 Text Interpretation: Atrial fibrillation Right bundle branch block Inferior infarct, age indeterminate Confirmed by Cindee Lame [phone]) on 11/03/2021 10:55:04 AM  Radiology No results  found.  Procedures Procedures    Medications Ordered in ED Medications  sodium chloride 0.9 % bolus 500 mL (has no administration in time range)    ED Course/ Medical Decision Making/ A&P                          Medical Decision Making Amount and/or Complexity of Data Reviewed Labs: ordered. Decision-making details documented in ED Course. Radiology: ordered. Decision-making details documented in ED Course.  Risk Decision regarding hospitalization.   Patient presents tachycardic in afib w/ RVR at 120s bpm, BP in 623J systolic.   For this patient's loss of consciousness, consider ACS versus arrhythmia especially considering periodic chest discomfort that patient has experienced throughout this week.  EKG with A-fib with RVR currently, patient does not feel lightheaded or have symptoms now, doubt that this caused her syncope, and no signs of ischemia.  Troponin is negative.  Also consider electrolyte abnormalities versus anemia, with cough consider pneumonia or viral infection,.  She has no headache or new focal neurodeficits to suggest hemorrhage versus CVA.  No shortness of breath to indicate heart failure or PE, and no signs of DVT.   I have personally reviewed and interpreted all labs and imaging, as listed below.   Clinical Course as of 11/11/21 1319  Tue Nov 03, 2021  1125 DG Chest Portable 1 View No active disease. [HN]  1221 ECG Heart Rate: 90 With fluid resuscitation [HN]  1222 Creatinine(!): 1.14 Up from 0.8, slight AKI [HN]  1223 Hemoglobin(!): 16.8 Hemoconcentration likely.  [HN]  1223 Normal electrolytes. WBC 3.5 [HN]  1449 SARS Coronavirus 2 by RT PCR(!): POSITIVE [HN]    Clinical Course User Index [HN] Audley Hose, MD   Patient is found to have slight AKI and her A-fib with RVR had a resolved rate after some fluid resuscitation.  She was found to be COVID-positive with no findings on her portable chest x-ray.  I believe her symptoms are likely due to  dehydration in the setting of COVID-19.  She also had reported some foul-smelling urine and she is pending a urinalysis.  Patient is signed out to the oncoming physician who is made aware of her h/o, presentation, results, and plan.         Final Clinical Impression(s) / ED Diagnoses Final diagnoses:  COVID-19  Syncope, unspecified syncope type    Rx / DC Orders ED Discharge Orders     None        This note was created using dictation software, which may contain spelling or grammatical errors.    Audley Hose, MD 11/11/21 1326

## 2021-11-03 NOTE — H&P (Incomplete)
Hospital Admission History and Physical Service Pager: [phone]  Patient name: [name] [name] Medical record number: [id number] Date of Birth: 1950/12/12 Age: 71 y.o. Gender: female  Primary Care Provider: Theotis Burrow, MD Consultants: None Code Status: Full Preferred Emergency Contact: Rodney Booze (significant other): [phone]  Chief Complaint: Syncope  Assessment and Plan: [name] [name] is a 71 y.o. female presenting with syncope. Differential for this patient's presentation of this includes episode of RVR with afib (most likely given PMH, presence of RVR in ED, quick resolution of symptoms), dehydration (likely present given orthostasis/lab values but would not completely explain syncope while sitting), ACS (less likely given no chest pain, reassuring EKG, flat troponins), PE (less likely given no O2 requirement or chest pain), and stroke (less likely given neurological exam overall reassuring in context of previous stroke, on eliquis for afib).  No notes have been filed under this hospital service. Service: Family Medicine  FEN/GI: Heart healthy diet, no fluids VTE Prophylaxis: Home eliquis 5 mg BID   Disposition: Med-tele   History of Present Illness:  [name] [name] is a 71 y.o. female presenting with syncope.   Reports that she was getting her hair done when she passed out. She was sitting down at the time. She endorses a headache and some dizziness prior to syncope. She reports that she "wasn't feeling good". Has been feeling tired recently- has been laying down more often. Feels like she has some difficulty finding her words.    Typically does feel dizzy when she gets up from a seated position. She does admit that she does not hydrate well with water- sometimes doesn't have any. Pepsi is her preferred drink- typically sips on one all day long.    Denies any current headache, dizziness, blurred vision. Still endorses some fatigue. Denies palpitations.  Denies any chest pain, SOB. Denies fevers. No N/V. No constipation or diarrhea. No dysuria.    Denies being vaccinated against COVID-19.    In the ED, she was found to be in afib with RVR. She was not requiring O2 and overall well-appearing. Lab studies revealed COVID+, flat troponins, elevated Cr, and erythrocytosis. CXR negative.   Review Of Systems: Per HPI.   Pertinent Past Medical History: Paroxysmal Atrial fibrillation  Hx CVA, hx STEMI  HTN HLD  Seizures- many years ago, "I haven't had one in a long time" Remainder reviewed in history tab.    Pertinent Past Surgical History: CABG x5  Tubal ligation Previous c-section Remainder reviewed in history tab.    Pertinent Social History: Tobacco use: 0.5 pack per day. Smoking since her 3s  Alcohol use: None recently  Other Substance use: Denies Lives with her partner of 36 years    Pertinent Family History: Heart disease in "all of them" Remainder reviewed in history tab.    Important Outpatient Medications: Eliquis  She is unable to name her other medications Remainder reviewed in medication history.   Objective: BP (!) 119/97   Pulse (!) 124   Temp 98.5 F (36.9 C) (Oral)   Resp 17   SpO2 100%  Exam: General: Alert and oriented, in NAD Skin: Warm, dry, and intact without lesions HEENT: NCAT, EOM grossly normal, midline nasal septum Cardiac: RRR, no m/r/g appreciated Respiratory: CTAB, breathing and speaking comfortably on RA Abdominal: Soft, nontender, nondistended, normoactive bowel sounds Extremities: Moves all extremities grossly equally Neurological: No gross focal deficit Psychiatric: Appropriate mood and affect   Labs:  CBC BMET  Recent Labs  Lab 11/03/21 1046  WBC 3.5*  HGB 16.8*  HCT 50.1*  PLT 165   Recent Labs  Lab 11/03/21 1046  NA 141  K 4.4  CL 107  CO2 22  BUN 22  CREATININE 1.14*  GLUCOSE 91  CALCIUM 9.5     COVID+ Troponins 8>7 UA with ketones 5 and protein 30  EKG:  afib with 122 BPM, RBBB present, no STEMI  Imaging Studies Performed:  CXR: no active disease.  Janeal Holmes, MD 11/03/2021, 8:37 PM PGY-1, [facility] Family Medicine FPTS Intern pager: [phone], text pages welcome Secure chat group [facility] Teaching Service

## 2021-11-03 NOTE — Assessment & Plan Note (Signed)
Rate is well controlled.  TSH came back low, free T4 was normal.  Suspect subclinical hypothyroid. -Echo showed worsening ejection fraction, and mild dilation of right/left atrium. -Continue metoprolol 25 mg BID for rate control -Continue home Eliquis 5 mg twice daily

## 2021-11-04 ENCOUNTER — Other Ambulatory Visit (HOSPITAL_COMMUNITY): Payer: Self-pay

## 2021-11-04 ENCOUNTER — Encounter (HOSPITAL_COMMUNITY): Payer: Self-pay | Admitting: Family Medicine

## 2021-11-04 ENCOUNTER — Observation Stay (HOSPITAL_BASED_OUTPATIENT_CLINIC_OR_DEPARTMENT_OTHER): Payer: 59

## 2021-11-04 ENCOUNTER — Telehealth (HOSPITAL_COMMUNITY): Payer: Self-pay

## 2021-11-04 DIAGNOSIS — R55 Syncope and collapse: Secondary | ICD-10-CM | POA: Diagnosis not present

## 2021-11-04 DIAGNOSIS — I69320 Aphasia following cerebral infarction: Secondary | ICD-10-CM

## 2021-11-04 DIAGNOSIS — E860 Dehydration: Secondary | ICD-10-CM | POA: Insufficient documentation

## 2021-11-04 DIAGNOSIS — U071 COVID-19: Secondary | ICD-10-CM | POA: Diagnosis not present

## 2021-11-04 DIAGNOSIS — I4891 Unspecified atrial fibrillation: Secondary | ICD-10-CM

## 2021-11-04 LAB — BASIC METABOLIC PANEL
Anion gap: 5 (ref 5–15)
BUN: 18 mg/dL (ref 8–23)
CO2: 18 mmol/L — ABNORMAL LOW (ref 22–32)
Calcium: 8.1 mg/dL — ABNORMAL LOW (ref 8.9–10.3)
Chloride: 114 mmol/L — ABNORMAL HIGH (ref 98–111)
Creatinine, Ser: 0.82 mg/dL (ref 0.44–1.00)
GFR, Estimated: >60 mL/min (ref >60)
Glucose, Bld: 122 mg/dL — ABNORMAL HIGH (ref 70–99)
Potassium: 3.7 mmol/L (ref 3.5–5.1)
Sodium: 137 mmol/L (ref 135–145)

## 2021-11-04 LAB — LIPID PANEL
Cholesterol: 143 mg/dL (ref 0–200)
HDL: 26 mg/dL — ABNORMAL LOW (ref >40)
LDL Cholesterol: 103 mg/dL — ABNORMAL HIGH (ref 0–99)
Total CHOL/HDL Ratio: 5.5 RATIO
Triglycerides: 71 mg/dL (ref <150)
VLDL: 14 mg/dL (ref 0–40)

## 2021-11-04 LAB — CBC
HCT: 40.1 % (ref 36.0–46.0)
Hemoglobin: 13.6 g/dL (ref 12.0–15.0)
MCH: 31.6 pg (ref 26.0–34.0)
MCHC: 33.9 g/dL (ref 30.0–36.0)
MCV: 93.3 fL (ref 80.0–100.0)
Platelets: 155 10*3/uL (ref 150–400)
RBC: 4.3 MIL/uL (ref 3.87–5.11)
RDW: 13.7 % (ref 11.5–15.5)
WBC: 3.3 10*3/uL — ABNORMAL LOW (ref 4.0–10.5)
nRBC: 0 % (ref 0.0–0.2)

## 2021-11-04 LAB — TSH: TSH: 0.247 u[IU]/mL — ABNORMAL LOW (ref 0.350–4.500)

## 2021-11-04 LAB — ECHOCARDIOGRAM COMPLETE
AR max vel: 1.37 cm2
AV Peak grad: 1.8 mmHg
Ao pk vel: 0.66 m/s
Area-P 1/2: 3.81 cm2
MV M vel: 2.3 m/s
MV Peak grad: 21.1 mmHg
P 1/2 time: 1094 msec
S' Lateral: 2.8 cm

## 2021-11-04 LAB — HEMOGLOBIN A1C
Hgb A1c MFr Bld: 5.4 % (ref 4.8–5.6)
Mean Plasma Glucose: 108.28 mg/dL

## 2021-11-04 MED ORDER — MAGNESIUM SULFATE 2 GM/50ML IV SOLN
2.0000 g | Freq: Once | INTRAVENOUS | Status: AC
  Administered 2021-11-04: 2 g via INTRAVENOUS
  Filled 2021-11-04: qty 50

## 2021-11-04 NOTE — Progress Notes (Signed)
PT Cancellation Note  Patient Details Name: [name] [name] MRN: [id number] DOB: [birth year]   Cancelled Treatment:    Reason Eval/Treat Not Completed: Other (comment).  OOF and will retry as time and pt allow.   Ramond Dial 11/04/2021, 11:26 AM  Mee Hives, PT PhD Acute Rehab Dept. Number: [place]

## 2021-11-04 NOTE — Progress Notes (Signed)
I spoke with Trish the cardiology master regarding loop recorder interrogation.  Loop recorder has been in since 2014.  She did call the representative who had reported that the loop recorder is no longer functional.

## 2021-11-04 NOTE — Progress Notes (Signed)
  Echocardiogram 2D Echocardiogram has been performed.  [name] [name] 11/04/2021, 10:38 AM

## 2021-11-04 NOTE — Assessment & Plan Note (Addendum)
Stable. No O2 requirement. Exam reassuring. Given comorbid conditions, will initiate molnupiravir 800 mg BID for 5 days. Droplet and contact precautions ordered.

## 2021-11-04 NOTE — TOC Benefit Eligibility Note (Signed)
Patient Teacher, English as a foreign language completed.    The current 30 day co-pay is, $0 for Entresto 24-26mg , Jardiance 10mg  and Farxiga 10mg .   The patient is insured through [employer], [place] Patient Advocate Specialist [place] Patient Advocate Team Direct Number: [phone]  Fax: [phone]

## 2021-11-04 NOTE — Assessment & Plan Note (Addendum)
No new episodes of syncope overnight.  No new neurologic deficit this morning.  No arrhythmias noted on telemetry.  Orthostatics were negative.  Echo completed and showed LVEF of 40-45% (EF was normal 3 years ago).  Differential includes dehydration, worsening CHF, COVID infection. - Consider cardiology consult, appreciate recs - Encourage p.o. intake - Continue telemetry to assess for other arrhythmias -OOB with assistance

## 2021-11-04 NOTE — Evaluation (Signed)
Occupational Therapy Evaluation Patient Details Name: [name] [name] MRN: [id number] DOB: 12-Apr-1950 Today's Date: 11/04/2021   History of Present Illness 71 yo female with onset of syncopal episode at hair salon was admitted on 10/3, noted mild Covid 19 dx.  Have concerns for source of syncope with heart, has dehydration, with non functional loop recorder.  PMHx:  stroke with R hemiparesis, aphasia,   Clinical Impression   PTA, pt lives with spouse, reports typically Independent in ADLs and mobility without AD with assist for IADLs. Pt presents now fairly close to reported baseline though some deficits noted in strength and endurance, as well as baseline R UE deficits. Overall, pt requires Setup for UB ADLs, minguard for LB ADLs and min guard for mobility in room without AD. No overt LOB noted nor dizziness reported. Pt reports significant other can assist as needed at home. Anticipate no OT needs at DC but will follow while admitted to ensure progress to independence.  BP 120s/80s pre and post activity      Recommendations for follow up therapy are one component of a multi-disciplinary discharge planning process, led by the attending physician.  Recommendations may be updated based on patient status, additional functional criteria and insurance authorization.   Follow Up Recommendations  No OT follow up    Assistance Recommended at Discharge PRN  Patient can return home with the following      Functional Status Assessment  Patient has had a recent decline in their functional status and demonstrates the ability to make significant improvements in function in a reasonable and predictable amount of time.  Equipment Recommendations  None recommended by OT    Recommendations for Other Services       Precautions / Restrictions Precautions Precautions: Fall Precaution Comments: R hemiparesis and incoordination Restrictions Weight Bearing Restrictions: No      Mobility Bed  Mobility Overal bed mobility: Needs Assistance Bed Mobility: Supine to Sit, Sit to Supine     Supine to sit: Supervision Sit to supine: Supervision        Transfers Overall transfer level: Needs assistance Equipment used: None Transfers: Sit to/from Stand Sit to Stand: Min guard                  Balance Overall balance assessment: Needs assistance Sitting-balance support: Feet supported Sitting balance-Leahy Scale: Fair     Standing balance support: No upper extremity supported, During functional activity Standing balance-Leahy Scale: Good Standing balance comment: able to mobilize in room without AD                           ADL either performed or assessed with clinical judgement   ADL Overall ADL's : Needs assistance/impaired Eating/Feeding: Independent   Grooming: Supervision/safety;Standing   Upper Body Bathing: Set up;Sitting   Lower Body Bathing: Min guard;Sit to/from stand   Upper Body Dressing : Set up   Lower Body Dressing: Min guard   Toilet Transfer: Min guard;Ambulation   Toileting- Clothing Manipulation and Hygiene: Min guard;Sit to/from stand       Functional mobility during ADLs: Min guard General ADL Comments: seems fairly close to baseline, able to mobilize in room without LOB and without AD. some weakness noted though unsure how much is baseline     Vision Baseline Vision/History: 1 Wears glasses Ability to See in Adequate Light: 0 Adequate Patient Visual Report: No change from baseline Vision Assessment?: No apparent visual deficits  Perception     Praxis      Pertinent Vitals/Pain Pain Assessment Pain Assessment: No/denies pain     Hand Dominance Left   Extremity/Trunk Assessment Upper Extremity Assessment Upper Extremity Assessment: RUE deficits/detail RUE Deficits / Details: hx of CVA. tendency to hold in flexed/protective position but with cues, able to relax UE by her side in standing. Able to range  [facility]. fair grasp though likely baseline weakness RUE: Unable to fully assess due to immobilization (poor extension control to manage grip and extend elbow to manage walker) RUE Coordination: decreased gross motor   Lower Extremity Assessment Lower Extremity Assessment: Defer to PT evaluation RLE Deficits / Details: 4- RLE strength with low DF ROM RLE Coordination: decreased gross motor   Cervical / Trunk Assessment Cervical / Trunk Assessment: Kyphotic   Communication Communication Communication: No difficulties   Cognition Arousal/Alertness: Awake/alert Behavior During Therapy: WFL for tasks assessed/performed Overall Cognitive Status: No family/caregiver present to determine baseline cognitive functioning                                 General Comments: some problem solving deficits but overall functional     General Comments  pt is weak to stand on RLE, but insists she was not using AD previously.  Also denies using furniture walking    Exercises     Shoulder Instructions      Home Living Family/patient expects to be discharged to:: Private residence Living Arrangements: Spouse/significant other Available Help at Discharge: Family;Available 24 hours/day Type of Home: Apartment Home Access: Level entry     Home Layout: One level     Bathroom Shower/Tub: Chief Strategy Officer: Standard     Home Equipment: TEFL teacher Comments: lives in hotel with her significant other      Prior Functioning/Environment Prior Level of Function : Needs assist             Mobility Comments: reports no AD for mobility ADLs Comments: Independent with ADLs, spouse completes IADLs. pt does not drive        OT Problem List: Decreased strength;Decreased activity tolerance;Impaired balance (sitting and/or standing);Decreased cognition      OT Treatment/Interventions: Self-care/ADL training;Therapeutic exercise;Energy conservation;DME  and/or AE instruction;Therapeutic activities    OT Goals(Current goals can be found in the care plan section) Acute Rehab OT Goals Patient Stated Goal: get this tv working OT Goal Formulation: With patient Time For Goal Achievement: 11/18/21 Potential to Achieve Goals: Good  OT Frequency: Min 2X/week    Co-evaluation              AM-PAC OT "6 Clicks" Daily Activity     Outcome Measure Help from another person eating meals?: None Help from another person taking care of personal grooming?: A Little Help from another person toileting, which includes using toliet, bedpan, or urinal?: A Little Help from another person bathing (including washing, rinsing, drying)?: A Little Help from another person to put on and taking off regular upper body clothing?: A Little Help from another person to put on and taking off regular lower body clothing?: A Little 6 Click Score: 19   End of Session Equipment Utilized During Treatment: Gait belt Nurse Communication: Mobility status  Activity Tolerance: Patient tolerated treatment well Patient left: in bed;with call bell/phone within reach;with bed alarm set  OT Visit Diagnosis: Other abnormalities of gait and mobility (R26.89);Muscle weakness (generalized) (  M62.81)                Time: 9233-0076 OT Time Calculation (min): 15 min Charges:  OT General Charges $OT Visit: 1 Visit OT Evaluation $OT Eval Low Complexity: 1 Low  Malachy Chamber, OTR/L Acute Rehab Services Office: [phone]   Layla Maw 11/04/2021, 3:14 PM

## 2021-11-04 NOTE — Progress Notes (Signed)
Daily Progress Note Intern Pager: [phone]  Patient name: [name] [name] Medical record number: [id number] Date of birth: 1950/04/19 Age: 71 y.o. Gender: female  Primary Care Provider: Theotis Burrow, MD Consultants: None Code Status: Full  Pt Overview and Major Events to Date:  10/3: Admitted to FMTS  Assessment and Plan: [name] [name] is a 71 year old female who presented with syncope.  Differential includes A-fib with RVR and dehydration complicated by active COVID infection.  Other sources of syncope were worked up including ACS and PE.  Low suspicion of stroke (aphasia present from previous CVA), as neurologic function has not changed.  Pertinent medical history: History of CVA, history of STEMI (CABG x5), HLD, paroxysmal atrial fibrillation  * Syncope No episode of syncope overnight.  No new neurologic deficits this morning, defer head imaging at this time.  No arrhythmias noted on telemetry.  Unable to interrogate loop recorder, not-active (originally placed 2014). -Follow-up echo -Follow-up orthostatics -Encourage PO intake  -Continuous telemetry to assess for other arrhythmias  -OOB with assistance   Atrial fibrillation with RVR ([name]) Rate is well controlled, no episodes of RVR overnight.  Mag of 1.7, we can replete. -Continue metoprolol 25 mg BID for rate control -Replete mag -Follow-up on Echo to evaluate structural abnormalities, EF -Continue home eliquis 5 mg BID  -Cardiac monitoring -Vitals with pulse oximetry  Dehydration Creatinine 0.82, down from 1.14 on admission.  S/p 1.5 L saline bolus.  History of poor oral intake at home. - Encourage p.o. intake  COVID-19 Stable this morning.  Symptom onset 3 days ago.  Exam remains reassuring.  First dose of molnupiravir 800 mg this morning. -Continue molnupiravir 800 mg twice daily (10/4-10/8)   Aphasia as late effect of cerebrovascular accident Stable and chronic deficit from previous CVA.   Hemoglobin A1c 5.4, LDL 103. Her goal likely needs to be LDL <70 due to significant history of cardiovascular disease. No new neurologic deficits on exam today. -Continue high-intensity lipitor, outpatient follow-up to discuss -Follow-up on PT/OT/SLP -Continue home aricept that was previously started following her CVA     FEN/GI: Heart healthy diet PPx: Home Eliquis 5 mg twice daily Dispo:Home pending clinical improvement .   Subjective:  Patient has no acute concerns today, she reports that she is feeling well.  Denies chest pain, shortness of breath.  Has not felt dizzy or had a syncopal event since admission.  Objective: Temp:  [97.9 F (36.6 C)-100 F (37.8 C)] 97.9 F (36.6 C) (10/04 1121) Pulse Rate:  [46-139] 98 (10/04 1121) Resp:  [15-29] 18 (10/04 1121) BP: (95-141)/(63-98) 116/98 (10/04 1121) SpO2:  [96 %-100 %] 99 % (10/04 1121) Weight:  [34.8 kg] 34.8 kg (10/04 1121) Physical Exam: General: Not in acute distress, pleasant Cardiovascular: Irregular rate and rhythm Respiratory: CTAB, normal work of breathing on room air Extremities: No edema, warm and dry  Laboratory: Most recent CBC Lab Results  Component Value Date   WBC 3.3 (L) 11/04/2021   HGB 13.6 11/04/2021   HCT 40.1 11/04/2021   MCV 93.3 11/04/2021   PLT 155 11/04/2021   Most recent BMP    Latest Ref Rng & Units 11/04/2021    3:37 AM  BMP  Glucose 70 - 99 mg/dL 122   BUN 8 - 23 mg/dL 18   Creatinine 0.44 - 1.00 mg/dL 0.82   Sodium 135 - 145 mmol/L 137   Potassium 3.5 - 5.1 mmol/L 3.7   Chloride 98 - 111 mmol/L 114  CO2 22 - 32 mmol/L 18   Calcium 8.9 - 10.3 mg/dL 8.1     Other pertinent labs  SARS Coronavirus: Positive +   Imaging/Diagnostic Tests: DG Chest Portable 1 View Result Date: 11/03/2021 IMPRESSION: No active disease.    Tiffany Kocher, DO 11/04/2021, 12:27 PM  PGY-1, [place] [facility] FPTS Intern pager: [phone], text pages welcome Secure chat group [facility] Teaching Service

## 2021-11-04 NOTE — Evaluation (Signed)
Physical Therapy Evaluation Patient Details Name: [name] [name] MRN: [id number] DOB: 1950-12-28 Today's Date: 11/04/2021  History of Present Illness  71 yo female with onset of syncopal episode at hair salon was admitted on 10/3, noted mild Covid 19 dx.  Have concerns for source of syncope with heart, has dehydration, with non functional loop recorder.  PMHx:  stroke with R hemiparesis, aphasia,  Clinical Impression  Pt was seen for mobility on RW with poor success due to weak and incoordinated RUE, but needs contact to walk well in the room.  Have plan to try a HW next session due to her crossed step to turn and balance on HHA, and will focus on more standing and WB on RLE to continue to recover safety and strength.  Pt is in a level environment that she described as hotel to PT but then has address in chart as apt.  Follow acutely for goals of PT as outlined on POC.     Recommendations for follow up therapy are one component of a multi-disciplinary discharge planning process, led by the attending physician.  Recommendations may be updated based on patient status, additional functional criteria and insurance authorization.  Follow Up Recommendations Outpatient PT      Assistance Recommended at Discharge Frequent or constant Supervision/Assistance  Patient can return home with the following  A little help with walking and/or transfers;A little help with bathing/dressing/bathroom;Assistance with cooking/housework;Assist for transportation    Equipment Recommendations Other (comment) Psychologist, educational)  Recommendations for Other Services       Functional Status Assessment Patient has had a recent decline in their functional status and demonstrates the ability to make significant improvements in function in a reasonable and predictable amount of time.     Precautions / Restrictions Precautions Precautions: Fall Precaution Comments: R hemiparesis and incoordination Restrictions Weight Bearing  Restrictions: No      Mobility  Bed Mobility Overal bed mobility: Needs Assistance Bed Mobility: Supine to Sit, Sit to Supine     Supine to sit: Min assist Sit to supine: Min assist   General bed mobility comments: has her female friend who is there with her to assist movement per pt    Transfers Overall transfer level: Needs assistance Equipment used: Rolling walker (2 wheels), 1 person hand held assist Transfers: Sit to/from Stand Sit to Stand: Min assist           General transfer comment: min assist to steady balance initially    Ambulation/Gait Ambulation/Gait assistance: Min guard, Min assist Gait Distance (Feet): 30 Feet Assistive device: 1 person hand held assist, Rolling walker (2 wheels) Gait Pattern/deviations: Step-through pattern, Decreased stride length Gait velocity: reduced Gait velocity interpretation: <1.31 ft/sec, indicative of household ambulator Pre-gait activities: standing balance ck General Gait Details: crossed over LLE on RLE due to instability on her turn from door due to her inabiltiy to use walker  Stairs            Wheelchair Mobility    Modified Rankin (Stroke Patients Only)       Balance Overall balance assessment: Needs assistance Sitting-balance support: Feet supported Sitting balance-Leahy Scale: Fair   Postural control: Posterior lean, Right lateral lean, Left lateral lean Standing balance support: Single extremity supported Standing balance-Leahy Scale: Poor Standing balance comment: RLE is weak and uncoordinated, leading to cross steps on LLE to steady  Pertinent Vitals/Pain Pain Assessment Pain Assessment: No/denies pain    Home Living Family/patient expects to be discharged to:: Other (Comment) (Simultaneous filing. User may not have seen previous data.) Living Arrangements: Spouse/significant other Available Help at Discharge: Family;Available 24 hours/day Type of  Home: Apartment Home Access: Level entry       Home Layout: One level Home Equipment: Shower seat Additional Comments: lives in hotel with her significant other    Prior Function Prior Level of Function : Independent/Modified Independent (Simultaneous filing. User may not have seen previous data.)             Mobility Comments: no AD used but also has no equipment (Simultaneous filing. User may not have seen previous data.) ADLs Comments: Independent with ADLs, spouse completes IADLs. pt does not drive     Hand Dominance   Dominant Hand: Left (Simultaneous filing. User may not have seen previous data.)    Extremity/Trunk Assessment   Upper Extremity Assessment Upper Extremity Assessment: RUE deficits/detail RUE Deficits / Details: weakness generally with poor grip and flexion tightness RUE: Unable to fully assess due to immobilization (poor extension control to manage grip and extend elbow to manage walker) RUE Coordination: decreased gross motor    Lower Extremity Assessment Lower Extremity Assessment: Generalized weakness;RLE deficits/detail RLE Deficits / Details: 4- RLE strength with low DF ROM RLE Coordination: decreased gross motor    Cervical / Trunk Assessment Cervical / Trunk Assessment: Kyphotic (mild)  Communication   Communication: Expressive difficulties (Simultaneous filing. User may not have seen previous data.)  Cognition Arousal/Alertness: Awake/alert Behavior During Therapy: WFL for tasks assessed/performed Overall Cognitive Status: Within Functional Limits for tasks assessed                                          General Comments General comments (skin integrity, edema, etc.): pt is weak to stand on RLE, but insists she was not using AD previously.  Also denies using furniture walking    Exercises     Assessment/Plan    PT Assessment Patient needs continued PT services  PT Problem List Decreased strength;Decreased range  of motion;Decreased activity tolerance;Decreased balance;Decreased mobility;Decreased coordination;Decreased knowledge of use of DME;Decreased safety awareness       PT Treatment Interventions DME instruction;Gait training;Functional mobility training;Therapeutic activities;Therapeutic exercise;Balance training;Neuromuscular re-education;Patient/family education    PT Goals (Current goals can be found in the Care Plan section)  Acute Rehab PT Goals Patient Stated Goal: to walk and be safe PT Goal Formulation: With patient Time For Goal Achievement: 11/18/21 Potential to Achieve Goals: Good    Frequency Min 3X/week     Co-evaluation               AM-PAC PT "6 Clicks" Mobility  Outcome Measure Help needed turning from your back to your side while in a flat bed without using bedrails?: A Little Help needed moving from lying on your back to sitting on the side of a flat bed without using bedrails?: A Little Help needed moving to and from a bed to a chair (including a wheelchair)?: A Little Help needed standing up from a chair using your arms (e.g., wheelchair or bedside chair)?: A Little Help needed to walk in hospital room?: A Little Help needed climbing 3-5 steps with a railing? : Total 6 Click Score: 16    End of Session Equipment Utilized During Treatment: Gait  belt Activity Tolerance: Patient limited by fatigue;Treatment limited secondary to medical complications (Comment) Patient left: in bed;with call bell/phone within reach;with bed alarm set Nurse Communication: Mobility status PT Visit Diagnosis: Unsteadiness on feet (R26.81)    Time: 6734-1937 PT Time Calculation (min) (ACUTE ONLY): 19 min   Charges:   PT Evaluation $PT Eval Moderate Complexity: 1 Mod         Ramond Dial 11/04/2021, 3:08 PM  Mee Hives, PT PhD Acute Rehab Dept. Number: [place]

## 2021-11-04 NOTE — Telephone Encounter (Signed)
Pharmacy Patient Advocate Encounter  Insurance verification completed.    The patient is insured through [employer] Part D   The patient is currently admitted and ran test claims for the following: [name] [name], Jardiance, and Far[employer].  Copays and coinsurance results were relayed to Inpatient clinical team.

## 2021-11-04 NOTE — Assessment & Plan Note (Addendum)
Stable. Continuing lipitor. Will obtain Hgb A1c and lipid panel for further risk stratification.

## 2021-11-04 NOTE — Assessment & Plan Note (Addendum)
Creatinine 0.82, down from 1.14 on admission.  S/p 1.5 L saline bolus.  History of poor oral intake at home. - Encourage p.o. intake

## 2021-11-05 ENCOUNTER — Other Ambulatory Visit (HOSPITAL_COMMUNITY): Payer: Self-pay

## 2021-11-05 DIAGNOSIS — I4819 Other persistent atrial fibrillation: Secondary | ICD-10-CM

## 2021-11-05 DIAGNOSIS — I69320 Aphasia following cerebral infarction: Secondary | ICD-10-CM | POA: Diagnosis not present

## 2021-11-05 DIAGNOSIS — R55 Syncope and collapse: Secondary | ICD-10-CM | POA: Diagnosis not present

## 2021-11-05 DIAGNOSIS — I5022 Chronic systolic (congestive) heart failure: Secondary | ICD-10-CM | POA: Diagnosis not present

## 2021-11-05 DIAGNOSIS — I4891 Unspecified atrial fibrillation: Secondary | ICD-10-CM | POA: Diagnosis not present

## 2021-11-05 DIAGNOSIS — U071 COVID-19: Secondary | ICD-10-CM | POA: Diagnosis not present

## 2021-11-05 LAB — T4, FREE: Free T4: 1.02 ng/dL (ref 0.61–1.12)

## 2021-11-05 MED ORDER — METOPROLOL SUCCINATE ER 50 MG PO TB24
50.0000 mg | ORAL_TABLET | Freq: Every day | ORAL | Status: DC
  Filled 2021-11-05: qty 1

## 2021-11-05 MED ORDER — IRBESARTAN 150 MG PO TABS
150.0000 mg | ORAL_TABLET | Freq: Every day | ORAL | Status: DC
  Administered 2021-11-05: 150 mg via ORAL
  Filled 2021-11-05: qty 1

## 2021-11-05 MED ORDER — IRBESARTAN 150 MG PO TABS
150.0000 mg | ORAL_TABLET | Freq: Every day | ORAL | 0 refills | Status: DC
  Filled 2021-11-05: qty 30, 30d supply, fill #0

## 2021-11-05 MED ORDER — METOPROLOL SUCCINATE ER 50 MG PO TB24
50.0000 mg | ORAL_TABLET | Freq: Every day | ORAL | 0 refills | Status: DC
  Filled 2021-11-05: qty 30, 30d supply, fill #0

## 2021-11-05 NOTE — Progress Notes (Addendum)
Several unsuccessful attempts from Nursing staff to reach family members regarding discharge and pick up of patient.  Sister stated she was unable to pick up patient this AM. Now after multiple attempts to reach her and other patient contacts  there is no answer when called. Per patient her keys and purse are with the sister. Bedside RN updated.  Dula Havlik, Bettina Gavia rN

## 2021-11-05 NOTE — Progress Notes (Signed)
Daily Progress Note Intern Pager: [phone]  Patient name: [name] [name] Medical record number: [id number] Date of birth: 1950/05/21 Age: 71 y.o. Gender: female  Primary Care Provider: Preston Fleeting, MD Consultants: Cardiology Code Status: Full  Pt Overview and Major Events to Date:  10/3: Admitted to [facility] 10/4: Echo showed worsening ejection fraction 40-45%.  Assessment and Plan: [name] [name] is a 71 year old female who presented with syncope.  Differential for syncope includes A-fib with RVR, dehydration, active COVID infection, and worsening congestive heart failure as shown by echo. Pertinent PMH/PSH includes history of CVA, history of STEMI (CABG x5), HLD, paroxysmal atrial fibrillation .  * Heart failure with mildly reduced ejection fraction (HFmrEF) (HCC) Echo yesterday showed estimated LVEF of 40-45%, LVEF 55-60% 3 years ago.  Patient's current state complicated by COVID infection.  Patient is euvolemic on exam.  Currently on home ACE and beta-blocker.  No MRA nor SGLT2. - Cardiology consulted, appreciate recs - I's and O's and daily weight - Cardiac monitoring  Atrial fibrillation with RVR (HCC) Rate is well controlled.  TSH came back low, free T4 was normal.  Suspect subclinical hypothyroid. -Echo showed worsening ejection fraction, and mild dilation of right/left atrium. -Continue metoprolol 25 mg BID for rate control -Continue home Eliquis 5 mg twice daily  Syncope No new episodes of syncope overnight.  No new neurologic deficit this morning.  No arrhythmias noted on telemetry.  Orthostatics were negative.  Echo completed and showed LVEF of 40-45% (EF was normal 3 years ago).  Differential includes dehydration, worsening CHF, COVID infection. - Consider cardiology consult, appreciate recs - Encourage p.o. intake - Continue telemetry to assess for other arrhythmias -OOB with assistance   Dehydration - Encourage p.o. intake  COVID-19 Stable this  morning. -Continue molnupiravir 800 mg twice daily (10/4-10/8)   Aphasia as late effect of cerebrovascular accident No new neurologic deficits.  Stable. -Continue high-intensity lipitor, outpatient follow-up to discuss additional medical management -PT recommended outpatient PT -Continue home aricept    FEN/GI: Heart healthy PPx: Home Eliquis 5 mg twice daily Dispo:Home pending clinical improvement . Subjective:  Patient has no acute concerns today nor questions.  We discussed that her echo came back with reduced ejection fraction, and that we would like cardiology to see her before she can leave.  She is agreeable to this plan.  Objective: Temp:  [97.6 F (36.4 C)-99 F (37.2 C)] 97.6 F (36.4 C) (10/05 0300) Pulse Rate:  [65-98] 75 (10/04 2305) Resp:  [16-21] 17 (10/04 2305) BP: (98-138)/(75-98) 138/86 (10/05 0300) SpO2:  [99 %-100 %] 99 % (10/04 2305) Weight:  [34.8 kg] 34.8 kg (10/04 1121) Physical Exam: General: Not in acute distress, sitting up and eating Cardiovascular: Irregular rate and rhythm Respiratory: CTAB, normal work of breathing on room air Extremities: No edema, warm and dry Neuro: Alert and oriented x4, no new neurologic deficits appreciated.  Laboratory: Most recent CBC Lab Results  Component Value Date   WBC 3.3 (L) 11/04/2021   HGB 13.6 11/04/2021   HCT 40.1 11/04/2021   MCV 93.3 11/04/2021   PLT 155 11/04/2021   Most recent BMP    Latest Ref Rng & Units 11/04/2021    3:37 AM  BMP  Glucose 70 - 99 mg/dL 517   BUN 8 - 23 mg/dL 18   Creatinine 6.16 - 1.00 mg/dL 0.73   Sodium 710 - 626 mmol/L 137   Potassium 3.5 - 5.1 mmol/L 3.7   Chloride 98 -  111 mmol/L 114   CO2 22 - 32 mmol/L 18   Calcium 8.9 - 10.3 mg/dL 8.1     Imaging/Diagnostic Tests: Echo 10/4 Estimated LVEF 40-45%, mild dilation of left and right atria.  This is changed from her echo 3 years ago.   Leslie Dales, DO 11/05/2021, 9:26 AM  PGY-1, [place] Intern pager: [phone], text pages welcome Secure chat group [place]

## 2021-11-05 NOTE — Progress Notes (Signed)
11/05/2021 7:03 PM Discharge AVS meds taken today and those due this evening reviewed.  Follow-up appointments and when to call md reviewed.  D/C IV and TELE.  Questions and concerns addressed.   D/C home per orders.  Carney Corners

## 2021-11-05 NOTE — Discharge Instructions (Addendum)
Dear [name] [name],   Thank you so much for allowing [place] to be part of your care!  You were admitted to [facility] for COVID and Afib with a high heart rate.  Your echocardiogram showed some signs of heart failure.  Cardiology saw you in the hospital and recommended some medication changes shown in the discharge medication list   POST-HOSPITAL & CARE INSTRUCTIONS Stop taking lisinopril and amlodipine Start taking avapro daily and metoprolol daily, and your blood thinner twice a day Cardiology recommends that you should not drive for 6 months.  Please let PCP/Specialists know of any changes that were made.  Please see medications section of this packet for any medication changes.   DOCTOR'S APPOINTMENT & FOLLOW UP CARE INSTRUCTIONS  No future appointments.   Take care and be well!  [facility]  [address] [place], [place] [address] [phone]

## 2021-11-05 NOTE — Discharge Summary (Deleted)
[facility] Discharge Summary  Patient name: [name] [name] Medical record number: [id number] Date of birth: 12-24-1950 Age: 71 y.o. Gender: female Date of Admission: 11/03/2021  Date of Discharge: 11/05/2021 Admitting Physician: Jacelyn Grip, MD  Primary Care Provider: Theotis Burrow, MD Consultants: Cardiology  Indication for Hospitalization: Syncope  Brief Hospital Course:  [name] [name] is a 71 year old female who presented with syncope.  Leading differential for syncope was A-fib RVR complicated by dehydration and active COVID infection.  She was additionally found to have a worsening LV ejection fraction (40-45%).  Syncope Patient presented with a syncopal event while having her hair done.  She did not fall or hit her head.  ACS work-up was negative.  PE unlikely etiology given no O2 requirement, no chest pain.  No arrhythmias were found other than atrial fibrillation.  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  Echocardiogram showed decreased LVEF (40-45%) consistent with HFmREF (see below). Leading differential of syncope is atrial fibrillation complicated by dehydration and active COVID-19 (See below)-cardiology agreed.  Additionally per cardiology mutations patient should not drive for 6 months. Patient was without syncopal events while admitted and stable at time of discharge.  HFmREF Echocardiogram showed LV EF (40-45%), cardiology was consulted.  Per cardiology they discontinued her amlodipine and lisinopril.  They started Avapro 150 mg daily.  Recommended repeat echocardiogram in 3 to 6 months after she recovers from COVID and heart rate is controlled.  She will follow-up with cardiology outpatient.  Patient was euvolemic and stable at time of discharge.  AFIB RVR Atrial fibrillation was asymptomatic, no dyspnea or palpitations.  She was not on  rate controlling agents at home, started metoprolol during this admission.  Heart rate remained within goal during admission. Cardiology transitioned patient to 50 mg Toprol for rate control and continued her home apixaban.  She will follow-up with cardiology in 2 to 4 weeks.  Patient was asymptomatic and stable at time of discharge.  COVID-19 Symptom onset 10/1, COVID test positive on 10/3.  Patient was started on antiviral, little new.  We are 800 mg twice daily.  She was asymptomatic during admission.  Discontinued antiviral at discharge.  Stable and asymptomatic at time of discharge.  Dehydration Patient reported drinking only 1 Pepsi throughout the day without any water intake for several days.  Creatinine was elevated to 1.14 on admission, which trended down to 0.08 with oral hydration.  Patient had good p.o. intake during this admission.  We encouraged patient to stay hydrated after discharge.  Aphasia as late effect of cerebrovascular accident Stable and chronic deficit from previous CVA.  We continued her high intensity statin.  It was noted that LDL was greater than 100 on lipid screen.  We recommend patient follows up with PCP to discuss medication management with LDL goal less than 70 due to significant cardiovascular history.  Follow-up PCP instructions Please ensure patient has adequate p.o. intake, and discussed adding additional medication for LDL goal less than 70. Ensure patient has adequate follow-up with cardiology.  Discharge Diagnoses/Problem List:  Syncope Heart failure with mildly reduced ejection fraction (HFmrEF) (HCC) Atrial fibrillation with RVR (HCC) COVID-19 Dehydration Aphasia as late effect of cerebrovascular accident  Disposition: Home  Discharge Condition: Stable  Discharge Exam: Per my progress note General: Nonacute distress, sitting up eating Cardiovascular: Irregular rate and rhythm Respiratory: CTAB, normal work of breathing on room  air Extremities: No edema, warm and dry Neuro: Alert and oriented x4, no new neurologic deficits  Significant Procedures: None  Significant Labs and Imaging:  Recent Labs  Lab 11/04/21 0337  WBC 3.3*  HGB 13.6  HCT 40.1  PLT 155   Recent Labs  Lab 11/04/21 0337  NA 137  K 3.7  CL 114*  CO2 18*  GLUCOSE 122*  BUN 18  CREATININE 0.82  CALCIUM 8.1*    Results/Tests Pending at Time of Discharge: None   Imaging ECHOCARDIOGRAM COMPLETE Result Date: 11/04/2021 IMPRESSIONS  1. Left ventricular ejection fraction, by estimation, is 40 to 45%. The left ventricle has mildly decreased function. The left ventricle demonstrates global hypokinesis. Left ventricular diastolic parameters are indeterminate.  2. Right ventricular systolic function is mildly reduced. The right ventricular size is mildly enlarged. There is normal pulmonary artery systolic pressure. The estimated right ventricular systolic pressure is 27.7 mmHg.  3. Left atrial size was mildly dilated.  4. Right atrial size was mildly dilated.  5. The mitral valve is normal in structure. Trivial mitral valve regurgitation. No evidence of mitral stenosis.  6. Tricuspid valve regurgitation is mild to moderate.  7. The aortic valve is tricuspid. There is moderate calcification of the aortic valve. Aortic valve regurgitation is trivial.  8. The inferior vena cava is normal in size with <50% respiratory variability, suggesting right atrial pressure of 8 mmHg.  9. The patient was in atrial fibrillation.  CXR Result date: 11/03/2021 Impression: No active disease.  Left-sided recorder device present.   Discharge Medications:  Allergies as of 11/05/2021   No Known Allergies      Medication List     STOP taking these medications    amLODipine 10 MG tablet Commonly known as: NORVASC   lisinopril 20 MG tablet Commonly known as: ZESTRIL       TAKE these medications    apixaban 5 MG Tabs tablet Commonly known as: Eliquis Take  1 tablet (5 mg total) by mouth 2 (two) times daily.   atorvastatin 80 MG tablet Commonly known as: LIPITOR Take 80 mg by mouth daily.   donepezil 5 MG tablet Commonly known as: ARICEPT Take 1 tablet (5 mg total) by mouth at bedtime.   irbesartan 150 MG tablet Commonly known as: AVAPRO Take 1 tablet (150 mg total) by mouth daily. Start taking on: November 06, 2021   metoprolol succinate 50 MG 24 hr tablet Commonly known as: TOPROL-XL Take 1 tablet (50 mg total) by mouth daily. Take with or immediately following a meal.        Discharge Instructions: Please refer to Patient Instructions section of EMR for full details.  Patient was counseled important signs and symptoms that should prompt return to medical care, changes in medications, dietary instructions, activity restrictions, and follow up appointments.   Follow-Up Appointments:  Follow-up Information     Revelo, Presley Raddle, MD. Schedule an appointment as soon as possible for a visit on 11/09/2021.   Specialty: Family Medicine Contact information: [address] [place] [address] [phone]         [name], MD .   Specialties: Cardiology, Radiology Contact information: [address] [place] [address] [phone]                 Tiffany Kocher, DO 11/05/2021, 2:57 PM PGY-1, [facility]

## 2021-11-05 NOTE — Discharge Summary (Addendum)
[facility] Discharge Summary  Patient name: [name] [name] Medical record number: [id number] Date of birth: [birth year] Age: 71 y.o. Gender: female Date of Admission: 11/03/2021  Date of Discharge: 11/05/2021 Admitting Physician: Jacelyn Grip, MD  Primary Care Provider: Theotis Burrow, MD Consultants: Cardiology  Indication for Hospitalization: Syncope  Brief Hospital Course:  [name] [name] is a 71 year old female who presented with syncope.  Leading differential for syncope was A-fib RVR complicated by dehydration and active COVID infection.  She was additionally found to have a worsening LV ejection fraction (40-45%).  Syncope Patient presented with a syncopal event while having her hair done.  She did not fall or hit her head.  ACS work-up was negative.  PE unlikely etiology given no O2 requirement, no chest pain.  No arrhythmias were found other than atrial fibrillation.  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  Echocardiogram showed decreased LVEF (40-45%) consistent with HFmREF (see below). Leading differential of syncope is atrial fibrillation complicated by dehydration and active COVID-19 (See below)-cardiology agreed.  Additionally per cardiology mutations patient should not drive for 6 months. Patient was without syncopal events while admitted and stable at time of discharge.  HFmREF Echocardiogram showed LV EF (40-45%), cardiology was consulted.  Per cardiology they discontinued her amlodipine and lisinopril.  They started Avapro 150 mg daily.  Recommended repeat echocardiogram in 3 to 6 months after she recovers from COVID and heart rate is controlled.  She will follow-up with cardiology outpatient.  Patient was euvolemic and stable at time of discharge.  AFIB RVR Atrial fibrillation was asymptomatic, no dyspnea or palpitations.  She was not on  rate controlling agents at home, started metoprolol during this admission.  Heart rate remained within goal during admission. Cardiology transitioned patient to 50 mg Toprol for rate control and continued her home apixaban.  She will follow-up with cardiology in 2 to 4 weeks.  Patient was asymptomatic and stable at time of discharge.  COVID-19 Symptom onset 10/1, COVID test positive on 10/3.  Patient was started on antiviral, little new.  We are 800 mg twice daily.  She was asymptomatic during admission.  Discontinued antiviral at discharge.  Stable and asymptomatic at time of discharge.  Dehydration Patient reported drinking only 1 Pepsi throughout the day without any water intake for several days.  Creatinine was elevated to 1.14 on admission, which trended down to 0.08 with oral hydration.  Patient had good p.o. intake during this admission.  We encouraged patient to stay hydrated after discharge.  Aphasia as late effect of cerebrovascular accident Stable and chronic deficit from previous CVA.  We continued her high intensity statin.  It was noted that LDL was greater than 100 on lipid screen.  We recommend patient follows up with PCP to discuss medication management with LDL goal less than 70 due to significant cardiovascular history.  Follow-up PCP instructions Please ensure patient has adequate p.o. intake, and discussed adding additional medication for LDL goal less than 70. Ensure patient has adequate follow-up with cardiology. 3.   BMP at outpatient follow up Discharge Diagnoses/Problem List:  Syncope Heart failure with mildly reduced ejection fraction (HFmrEF) (HCC) Atrial fibrillation with RVR (HCC) COVID-19 Dehydration Aphasia as late effect of cerebrovascular accident  Disposition: Home  Discharge Condition: Stable  Discharge Exam: Per my progress note General: Nonacute distress, sitting up eating Cardiovascular: Irregular rate and rhythm Respiratory:  CTAB, normal work of  breathing on room air Extremities: No edema, warm and dry Neuro: Alert and oriented x4, no new neurologic deficits  Significant Procedures: None  Significant Labs and Imaging:  Recent Labs  Lab 11/04/21 0337  WBC 3.3*  HGB 13.6  HCT 40.1  PLT 155    Recent Labs  Lab 11/04/21 0337  NA 137  K 3.7  CL 114*  CO2 18*  GLUCOSE 122*  BUN 18  CREATININE 0.82  CALCIUM 8.1*     Results/Tests Pending at Time of Discharge: None   Imaging ECHOCARDIOGRAM COMPLETE Result Date: 11/04/2021 IMPRESSIONS  1. Left ventricular ejection fraction, by estimation, is 40 to 45%. The left ventricle has mildly decreased function. The left ventricle demonstrates global hypokinesis. Left ventricular diastolic parameters are indeterminate.  2. Right ventricular systolic function is mildly reduced. The right ventricular size is mildly enlarged. There is normal pulmonary artery systolic pressure. The estimated right ventricular systolic pressure is [id number] mmHg.  3. Left atrial size was mildly dilated.  4. Right atrial size was mildly dilated.  5. The mitral valve is normal in structure. Trivial mitral valve regurgitation. No evidence of mitral stenosis.  6. Tricuspid valve regurgitation is mild to moderate.  7. The aortic valve is tricuspid. There is moderate calcification of the aortic valve. Aortic valve regurgitation is trivial.  8. The inferior vena cava is normal in size with <50% respiratory variability, suggesting right atrial pressure of 8 mmHg.  9. The patient was in atrial fibrillation.  CXR Result date: 11/03/2021 Impression: No active disease.  Left-sided recorder device present.   Discharge Medications:  Allergies as of 11/05/2021   No Known Allergies      Medication List     STOP taking these medications    amLODipine 10 MG tablet Commonly known as: NORVASC   lisinopril 20 MG tablet Commonly known as: ZESTRIL       TAKE these medications    apixaban 5 MG Tabs tablet Commonly  known as: Eliquis Take 1 tablet (5 mg total) by mouth 2 (two) times daily.   atorvastatin 80 MG tablet Commonly known as: LIPITOR Take 80 mg by mouth daily.   donepezil 5 MG tablet Commonly known as: ARICEPT Take 1 tablet (5 mg total) by mouth at bedtime.   irbesartan 150 MG tablet Commonly known as: AVAPRO Take 1 tablet (150 mg total) by mouth daily. Start taking on: November 06, 2021   metoprolol succinate 50 MG 24 hr tablet Commonly known as: TOPROL-XL Take 1 tablet (50 mg total) by mouth daily. Take with or immediately following a meal.        Discharge Instructions: Please refer to Patient Instructions section of EMR for full details.  Patient was counseled important signs and symptoms that should prompt return to medical care, changes in medications, dietary instructions, activity restrictions, and follow up appointments.   Follow-Up Appointments:  Follow-up Information     Revelo, Elyse Jarvis, MD. Schedule an appointment as soon as possible for a visit on 11/09/2021.   Specialty: Family Medicine Contact information: [address] [place] [address] [phone]         Lorretta Harp, MD .   Specialties: Cardiology, Radiology Contact information: [address] [place] [address] [phone]                 Leslie Dales, [place] 11/05/2021, 6:35 PM PGY-1, [facility]  Upper Level Addendum:  I have  seen and evaluated this patient along with Dr. Nelda Bucks and reviewed the above note, making necessary revisions as appropriate.  I agree with the medical decision making and physical exam as noted above.  Gerrit Heck, MD PGY-2 [facility]

## 2021-11-05 NOTE — TOC Transition Note (Signed)
2 discharge meds in the TOC pharmacy for patient until discharge. 

## 2021-11-05 NOTE — Hospital Course (Addendum)
[name] [name] is a 71 year old female who presented with syncope.  Leading differential for syncope was A-fib RVR complicated by dehydration and active COVID infection.  She was additionally found to have a worsening LV ejection fraction (40-45%).  Syncope Patient presented with a syncopal event while having her hair done.  She did not fall or hit her head.  ACS work-up was negative.  PE unlikely etiology given no O2 requirement, no chest pain.  No arrhythmias were found other than atrial fibrillation.  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  Echocardiogram showed decreased LVEF (40-45%) consistent with HFmREF (see below). Leading differential of syncope is atrial fibrillation complicated by dehydration and active COVID-19 (See below)-cardiology agreed.  Additionally per cardiology mutations patient should not drive for 6 months. Patient was without syncopal events while admitted and stable at time of discharge.  HFmREF Echocardiogram showed LV EF (40-45%), cardiology was consulted.  Per cardiology they discontinued her amlodipine and lisinopril.  They started Avapro 150 mg daily.  Recommended repeat echocardiogram in 3 to 6 months after she recovers from COVID and heart rate is controlled.  She will follow-up with cardiology outpatient.  Patient was euvolemic and stable at time of discharge.  AFIB RVR Atrial fibrillation was asymptomatic, no dyspnea or palpitations.  She was not on rate controlling agents at home, started metoprolol during this admission.  Heart rate remained within goal during admission. Cardiology transitioned patient to 50 mg Toprol for rate control and continued her home apixaban.  She will follow-up with cardiology in 2 to 4 weeks.  Patient was asymptomatic and stable at time of discharge.  COVID-19 Symptom onset 10/1, COVID test positive on 10/3.  Patient was started on antiviral,  little new.  We are 800 mg twice daily.  She was asymptomatic during admission.  Discontinued antiviral at discharge.  Stable and asymptomatic at time of discharge.  Dehydration Patient reported drinking only 1 Pepsi throughout the day without any water intake for several days.  Creatinine was elevated to 1.14 on admission, which trended down to 0.08 with oral hydration.  Patient had good p.o. intake during this admission.  We encouraged patient to stay hydrated after discharge.  Aphasia as late effect of cerebrovascular accident Stable and chronic deficit from previous CVA.  We continued her high intensity statin.  It was noted that LDL was greater than 100 on lipid screen.  We recommend patient follows up with PCP to discuss medication management with LDL goal less than 70 due to significant cardiovascular history.  Follow-up PCP instructions Please ensure patient has adequate p.o. intake, and discussed adding additional medication for LDL goal less than 70. Ensure patient has adequate follow-up with cardiology.

## 2021-11-05 NOTE — Consult Note (Addendum)
Cardiology Consultation   Patient ID: [name] [name] MRN: [id number]; DOB: 1950/10/31  Admit date: 11/03/2021 Date of Consult: 11/05/2021  PCP:  Preston Fleeting, MD   [facility] Providers Cardiologist:  Nanetta Batty, MD        Patient Profile:   [name] [name] is a 71 y.o. female with a hx of atrial fibrillation (on Eliquis), ST elevation myocardial infarction status post CABG, stroke,  who is being seen 11/05/2021 for the evaluation of syncopal episode in the setting of afib at the request of Dr. Claudean Severance.  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 Patient states that she had symptoms of a headache and some dizziness prior to passing out. Endorses general malaise/fatigue for a couple of days prior to. She also reported difficulty with word finding, though notes that this has been a chronic change since her most recent CVA. Appears that chronic hydration has been an issue for patient and orthostatic symptoms are frequent.   In ED workup was notable for afib with RVR. CXR negative for acute cardiopulmonary process. Metabolic panel notable for elevated Cr, 1.14. Troponin checked and found negative 8, 7. Patient was also swabbed for COVID 19 and found positive.  Patient tells me today that other than general malaise in the 2 days preceding her syncopal event she has felt normal.  Denies dyspnea, chest pain, orthopnea.  Has not noticed a decline in exertional tolerance.  Today she is without symptoms of palpitations, chest pain, shortness of breath despite current atrial fibrillation rhythm.  She reports that she has never had symptoms with A-fib.  Appears to have been compliant with her Eliquis outpatient.  Past Medical History:  Diagnosis Date   Acute ischemic left MCA stroke (HCC) 03/11/2017   Hyperlipidemia LDL goal <  70 07/24/2012   Hypertension    S/P CABG x 5 : LIMA-LAD, SVG-OM1-OM2, SVG-(Y)-DIAG-PDA. 07/25/12 07/28/2012   POSTOPERATIVE DIAGNOSIS: Severe 3-vessel coronary disease, status post  myocardial infarction.  PROCEDURE: Median sternotomy, extracorporeal circulation, coronary  artery bypass grafting x5 (left internal mammary artery to LAD,  sequential saphenous vein graft to obtuse marginal 1, obtuse marginal 2,  and posterior descending with Y graft to 1st diagonal), endoscopic vein  harvest, left thigh.     Seizures (HCC)    "years ago" (03/11/2017)   STEMI (ST elevation myocardial infarction), secondary to disease of RCA, with disease of LAD and diag. branch and chronic non dominant LCX occlusion for CABG  07/21/2012   Stroke [facility]) ?08/2012   "right sided weakness; speech isssues since" (03/11/2017)   Tobacco abuse 07/21/2012    Past Surgical History:  Procedure Laterality Date   CARDIAC CATHETERIZATION     CESAREAN SECTION  X 1   CORONARY ARTERY BYPASS GRAFT N/A 07/25/2012   Procedure: CORONARY ARTERY BYPASS GRAFTING (CABG) times five with Left  Endoscopic Saphenous Vein Harvest and Left Internal  Mammary ;  Surgeon: Loreli Slot, MD;  Location: [facility] OR;  Service: Open Heart Surgery;  Laterality: N/A;   DILATION AND CURETTAGE OF UTERUS     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   LEFT HEART CATHETERIZATION WITH CORONARY ANGIOGRAM N/A 07/21/2012   Procedure: LEFT HEART CATHETERIZATION WITH CORONARY ANGIOGRAM;  Surgeon: [name], MD;  Location: [facility] CATH LAB;  Service: Cardiovascular;  Laterality:  N/A;   LOOP RECORDER IMPLANT  08-15-2012   Medtronic LinQ implanted by Dr Rayann Heman for cryptogenic stroke   TEE WITHOUT CARDIOVERSION N/A 08/15/2012   Procedure: TRANSESOPHAGEAL ECHOCARDIOGRAM (TEE);  Surgeon: Josue Hector, MD;  Location: [facility] ENDOSCOPY;  Service: Cardiovascular;  Laterality: N/A;   TUBAL LIGATION       Home Medications:  Prior to Admission medications   Medication Sig Start Date  End Date Taking? Authorizing Provider  amLODipine (NORVASC) 10 MG tablet Take 1 tablet (10 mg total) by mouth daily. Patient taking differently: Take 5 mg by mouth daily. 03/21/17  Yes Angiulli, Lavon Paganini, PA-C  apixaban (ELIQUIS) 5 MG TABS tablet Take 1 tablet (5 mg total) by mouth 2 (two) times daily. 03/21/17  Yes Angiulli, Lavon Paganini, PA-C  atorvastatin (LIPITOR) 80 MG tablet Take 80 mg by mouth daily.   Yes [provider]  donepezil (ARICEPT) 5 MG tablet Take 1 tablet (5 mg total) by mouth at bedtime. 03/21/17  Yes Angiulli, Lavon Paganini, PA-C  lisinopril (PRINIVIL,ZESTRIL) 20 MG tablet Take 1 tablet (20 mg total) by mouth daily. 03/21/17  Yes Angiulli, Lavon Paganini, PA-C    Inpatient Medications: Scheduled Meds:  amLODipine  5 mg Oral Daily   apixaban  5 mg Oral BID   atorvastatin  80 mg Oral Daily   donepezil  5 mg Oral QHS   metoprolol tartrate  25 mg Oral BID   molnupiravir EUA  4 capsule Oral BID   Continuous Infusions:  PRN Meds:   Allergies:   No Known Allergies  Social History:   Social History   Socioeconomic History   Marital status: Divorced    Spouse name: Not on file   Number of children: 1   Years of education: college   Highest education level: Not on file  Occupational History   Occupation: retired  Tobacco Use   Smoking status: Every Day    Packs/day: 1.00    Years: 51.00    Total pack years: 51.00    Types: Cigarettes    Start date: 08/25/2012   Smokeless tobacco: Never  Vaping Use   Vaping Use: Never used  Substance and Sexual Activity   Alcohol use: No   Drug use: No   Sexual activity: Never  Other Topics Concern   Not on file  Social History Narrative   Not on file   Social Determinants of Health   Financial Resource Strain: Not on file  Food Insecurity: Not on file  Transportation Needs: Not on file  Physical Activity: Not on file  Stress: Not on file  Social Connections: Not on file  Intimate Partner Violence: Not on file     Family History:    Family History  Problem Relation Age of Onset   Hypertension Mother    Stroke Brother        Died at age 34 secondary to MI   CAD Brother      ROS:  Please see the history of present illness.   All other ROS reviewed and negative.     Physical Exam/Data:   Vitals:   11/04/21 2219 11/04/21 2305 11/05/21 0300 11/05/21 0935  BP: 118/81 121/77 138/86 105/85  Pulse: 78 75  77  Resp:  17  16  Temp:  98.2 F (36.8 C) 97.6 F (36.4 C) 97.9 F (36.6 C)  TempSrc:  Oral Oral Oral  SpO2:  99%  98%  Weight:      Height:  Intake/Output Summary (Last 24 hours) at 11/05/2021 1211 Last data filed at 11/05/2021 1031 Gross per 24 hour  Intake 480 ml  Output 550 ml  Net -70 ml      11/04/2021   11:21 AM 09/27/2017    1:00 PM 09/12/2017   11:00 AM  Last 3 Weights  Weight (lbs) 76 lb 11.2 oz 90 lb 87 lb  Weight (kg) 34.791 kg 40.824 kg 39.463 kg     Body mass index is 14.98 kg/m.  General: Under nourished appearing in no acute distress HEENT: normal Neck: no JVD Vascular: No carotid bruits; Distal pulses 2+ bilaterally Cardiac:  normal S1, S2; RRR; no murmur  Lungs:  clear to auscultation bilaterally, no wheezing, rhonchi or rales  Abd: soft, nontender, no hepatomegaly  Ext: no edema Musculoskeletal:  No deformities, BUE and BLE strength normal and equal Skin: warm and dry  Neuro:  CNs 2-12 intact, no focal abnormalities noted Psych:  Normal affect   EKG:  No ECG since 11/03/21 Telemetry:  Telemetry was personally reviewed and demonstrates:  rate controlled afib  Relevant CV Studies:  03/12/17 TTE  Study Conclusions   - Left ventricle: The cavity size was normal. Systolic function was    normal. The estimated ejection fraction was in the range of 55%    to 60%. Wall motion was normal; there were no regional wall    motion abnormalities. Left ventricular diastolic function    parameters were normal.  - Mitral valve: There was mild  regurgitation.  - Right ventricle: The cavity size was mildly dilated. Wall    thickness was normal.  - Right atrium: The atrium was mildly dilated.  - Atrial septum: No defect or patent foramen ovale was identified.  - Pulmonary arteries: PA peak pressure: 35 mm Hg (S).   11/05/2021 TTE    1. Left ventricular ejection fraction, by estimation, is 40 to 45%. The  left ventricle has mildly decreased function. The left ventricle  demonstrates global hypokinesis. Left ventricular diastolic parameters are  indeterminate.   2. Right ventricular systolic function is mildly reduced. The right  ventricular size is mildly enlarged. There is normal pulmonary artery  systolic pressure. The estimated right ventricular systolic pressure is  [id number] mmHg.   3. Left atrial size was mildly dilated.   4. Right atrial size was mildly dilated.   5. The mitral valve is normal in structure. Trivial mitral valve  regurgitation. No evidence of mitral stenosis.   6. Tricuspid valve regurgitation is mild to moderate.   7. The aortic valve is tricuspid. There is moderate calcification of the  aortic valve. Aortic valve regurgitation is trivial.   8. The inferior vena cava is normal in size with <50% respiratory  variability, suggesting right atrial pressure of 8 mmHg.   9. The patient was in atrial fibrillation.   FINDINGS   Left Ventricle: Left ventricular ejection fraction, by estimation, is 40  to 45%. The left ventricle has mildly decreased function. The left  ventricle demonstrates global hypokinesis. The left ventricular internal  cavity size was normal in size. There is   no left ventricular hypertrophy. Left ventricular diastolic parameters  are indeterminate.   Right Ventricle: The right ventricular size is mildly enlarged. No  increase in right ventricular wall thickness. Right ventricular systolic  function is mildly reduced. There is normal pulmonary artery systolic  pressure. The tricuspid  regurgitant velocity   is 2.22 m/s, and with an assumed right atrial pressure of 8  mmHg, the  estimated right ventricular systolic pressure is 31.5 mmHg.   Left Atrium: Left atrial size was mildly dilated.   Right Atrium: Right atrial size was mildly dilated.   Pericardium: Trivial pericardial effusion is present.   Mitral Valve: The mitral valve is normal in structure. There is mild  calcification of the mitral valve leaflet(s). Mild mitral annular  calcification. Trivial mitral valve regurgitation. No evidence of mitral  valve stenosis.   Tricuspid Valve: The tricuspid valve is normal in structure. Tricuspid  valve regurgitation is mild to moderate.   Aortic Valve: The aortic valve is tricuspid. There is moderate  calcification of the aortic valve. Aortic valve regurgitation is trivial.  Aortic regurgitation PHT measures 1094 msec. Aortic valve peak gradient  measures 1.8 mmHg.   Pulmonic Valve: The pulmonic valve was normal in structure. Pulmonic valve  regurgitation is not visualized.   Aorta: The aortic root is normal in size and structure.   Venous: The inferior vena cava is normal in size with less than 50%  respiratory variability, suggesting right atrial pressure of 8 mmHg.   IAS/Shunts: No atrial level shunt detected by color flow Doppler.   Laboratory Data:  High Sensitivity Troponin:   Recent Labs  Lab 11/03/21 1046 11/03/21 1200  TROPONINIHS 8 7     Chemistry Recent Labs  Lab 11/03/21 1046 11/04/21 0337  NA 141 137  K 4.4 3.7  CL 107 114*  CO2 22 18*  GLUCOSE 91 122*  BUN 22 18  CREATININE 1.14* 0.82  CALCIUM 9.5 8.1*  MG 1.7  --   GFRNONAA 51* >60  ANIONGAP 12 5    Recent Labs  Lab 11/03/21 1046  PROT 7.3  ALBUMIN 3.6  AST 33  ALT 19  ALKPHOS 53  BILITOT 0.4   Lipids  Recent Labs  Lab 11/04/21 0337  CHOL 143  TRIG 71  HDL 26*  LDLCALC 103*  CHOLHDL 5.5    Hematology Recent Labs  Lab 11/03/21 1046 11/04/21 0337  WBC 3.5*  3.3*  RBC 5.36* 4.30  HGB 16.8* 13.6  HCT 50.1* 40.1  MCV 93.5 93.3  MCH 31.3 31.6  MCHC 33.5 33.9  RDW 13.7 13.7  PLT 165 155   Thyroid  Recent Labs  Lab 11/04/21 0335 11/05/21 0134  TSH 0.247*  --   FREET4  --  1.02    BNPNo results for input(s): "BNP", "PROBNP" in the last 168 hours.  DDimer No results for input(s): "DDIMER" in the last 168 hours.   Radiology/Studies:  ECHOCARDIOGRAM COMPLETE  Result Date: 11/04/2021    ECHOCARDIOGRAM REPORT   Patient Name:   [name] [name] Date of Exam: 11/04/2021 Medical Rec #:  [id number]       Height:       60.0 in Accession #:    [id number]      Weight:       90.0 lb Date of Birth:  Jun 06, 1950       BSA:          1.329 m Patient Age:    1 years        BP:           130/84 mmHg Patient Gender: F               HR:           72 bpm. Exam Location:  Inpatient Procedure: 2D Echo Indications:    Syncope  History:  Patient has prior history of Echocardiogram examinations, most                 recent 03/12/2017. Arrythmias:Atrial Fibrillation,                 Signs/Symptoms:Syncope; Risk Factors:Current Smoker and                 Hypertension.  Sonographer:    Cathie Hoops Referring Phys: [id number] CHRISTOPHER J TEGELER  Sonographer Comments: Technically difficult study due to poor echo windows and suboptimal apical window. Image acquisition challenging due to respiratory motion and supine. IMPRESSIONS  1. Left ventricular ejection fraction, by estimation, is 40 to 45%. The left ventricle has mildly decreased function. The left ventricle demonstrates global hypokinesis. Left ventricular diastolic parameters are indeterminate.  2. Right ventricular systolic function is mildly reduced. The right ventricular size is mildly enlarged. There is normal pulmonary artery systolic pressure. The estimated right ventricular systolic pressure is 27.7 mmHg.  3. Left atrial size was mildly dilated.  4. Right atrial size was mildly dilated.  5. The mitral valve is normal  in structure. Trivial mitral valve regurgitation. No evidence of mitral stenosis.  6. Tricuspid valve regurgitation is mild to moderate.  7. The aortic valve is tricuspid. There is moderate calcification of the aortic valve. Aortic valve regurgitation is trivial.  8. The inferior vena cava is normal in size with <50% respiratory variability, suggesting right atrial pressure of 8 mmHg.  9. The patient was in atrial fibrillation. FINDINGS  Left Ventricle: Left ventricular ejection fraction, by estimation, is 40 to 45%. The left ventricle has mildly decreased function. The left ventricle demonstrates global hypokinesis. The left ventricular internal cavity size was normal in size. There is  no left ventricular hypertrophy. Left ventricular diastolic parameters are indeterminate. Right Ventricle: The right ventricular size is mildly enlarged. No increase in right ventricular wall thickness. Right ventricular systolic function is mildly reduced. There is normal pulmonary artery systolic pressure. The tricuspid regurgitant velocity  is 2.22 m/s, and with an assumed right atrial pressure of 8 mmHg, the estimated right ventricular systolic pressure is 27.7 mmHg. Left Atrium: Left atrial size was mildly dilated. Right Atrium: Right atrial size was mildly dilated. Pericardium: Trivial pericardial effusion is present. Mitral Valve: The mitral valve is normal in structure. There is mild calcification of the mitral valve leaflet(s). Mild mitral annular calcification. Trivial mitral valve regurgitation. No evidence of mitral valve stenosis. Tricuspid Valve: The tricuspid valve is normal in structure. Tricuspid valve regurgitation is mild to moderate. Aortic Valve: The aortic valve is tricuspid. There is moderate calcification of the aortic valve. Aortic valve regurgitation is trivial. Aortic regurgitation PHT measures 1094 msec. Aortic valve peak gradient measures 1.8 mmHg. Pulmonic Valve: The pulmonic valve was normal in  structure. Pulmonic valve regurgitation is not visualized. Aorta: The aortic root is normal in size and structure. Venous: The inferior vena cava is normal in size with less than 50% respiratory variability, suggesting right atrial pressure of 8 mmHg. IAS/Shunts: No atrial level shunt detected by color flow Doppler.  LEFT VENTRICLE PLAX 2D LVIDd:         3.50 cm   Diastology LVIDs:         2.80 cm   LV e' medial:    10.70 cm/s LV PW:         0.80 cm   LV E/e' medial:  5.3 LV IVS:        0.80 cm  LV e' lateral:   9.90 cm/s LVOT diam:     1.80 cm   LV E/e' lateral: 5.7 LV SV:         17 LV SV Index:   13 LVOT Area:     2.54 cm  RIGHT VENTRICLE RV Basal diam:  4.30 cm RV Mid diam:    3.70 cm RV S prime:     7.83 cm/s TAPSE (M-mode): 0.9 cm LEFT ATRIUM           Index        RIGHT ATRIUM           Index LA diam:      3.00 cm 2.26 cm/m   RA Area:     15.30 cm LA Vol (A2C): 25.1 ml 18.88 ml/m  RA Volume:   39.40 ml  29.64 ml/m LA Vol (A4C): 26.0 ml 19.56 ml/m  AORTIC VALVE                 PULMONIC VALVE AV Area (Vmax): 1.37 cm     PV Vmax:       0.52 m/s AV Vmax:        66.40 cm/s   PV Peak grad:  1.1 mmHg AV Peak Grad:   1.8 mmHg LVOT Vmax:      35.80 cm/s LVOT Vmean:     25.700 cm/s LVOT VTI:       0.068 m AI PHT:         1094 msec  AORTA Ao Root diam: 2.80 cm Ao Asc diam:  2.60 cm MITRAL VALVE               TRICUSPID VALVE MV Area (PHT): 3.81 cm    TR Peak grad:   19.7 mmHg MV Decel Time: 199 msec    TR Vmax:        222.00 cm/s MR Peak grad: 21.1 mmHg MR Vmax:      229.67 cm/s  SHUNTS MV E velocity: 56.90 cm/s  Systemic VTI:  0.07 m MV A velocity: 21.30 cm/s  Systemic Diam: 1.80 cm MV E/A ratio:  2.67 Dalton McleanMD Electronically signed by Franki Monte Signature Date/Time: 11/04/2021/2:30:47 PM    Final    DG Chest Portable 1 View  Result Date: 11/03/2021 CLINICAL DATA:  Chest pain EXAM: PORTABLE CHEST 1 VIEW COMPARISON:  09/26/2012 FINDINGS: Left-sided loop recorder device. Prior sternotomy and CABG.  The heart size and mediastinal contours are within normal limits. Aortic atherosclerosis. Both lungs are clear. The visualized skeletal structures are unremarkable. IMPRESSION: No active disease. Electronically Signed   By: Davina Poke D.O.   On: 11/03/2021 11:15     Assessment and Plan:   Paroxysmal atrial fibrillation with RVR, secondary hypercoagulable state Syncope with concern for dehydration Covid-19  Patient presenting to the ED on 10/3 after a syncopal episode. Noted to be in afib with RVR on ECG with positive COVID-19 test. History of paroxsymal afib since 2014 (loop recorder implanted 2014), has been maintained on [place] since 2019. Concern for dehydration with Cr elevated to 1.14 and evidence of contraction elevation of Hgb although orthostatic vitals signs have been negative.  Ongoing A-fib that is rate controlled in the 60s noted on telemetry today.  Patient continues to have no symptoms with A-fib and has not felt near syncopal since admission.  Suspect syncopal episode likely secondary to COVID-19 induced RVR with a component of dehydration. Continue Eliquis 5mg . Rates well controlled with Metoprolol Tartrate 25mg  BID.  If ongoing rate control necessary, would consolidate to Metoprolol Succinate 50mg  QD. Would recommend outpatient cardiology follow up upon discharge.  Maintain magnesium greater than 2, potassium greater than 4.  Decreased LVEF  TTE this admission notable for LVEF 40-45%, global hypokinesis in the setting of active afib. No significant valvular flow abnormalities. This is down from LVEF 55-60% in 2019.   Decreased LVEF not altogether surprising in the setting of recent RVR of unknown duration.  Patient without symptoms of heart failure or fluid overload.  Would recommend repeat TTE in 3 months.  If LVEF continues to be low at that time, would be reasonable to consider restoration of normal sinus rhythm.    Risk Assessment/Risk Scores:    CHA2DS2-VASc Score = 4    This indicates a 4.8% annual risk of stroke. The patient's score is based upon: CHF History: 0 HTN History: 0 Diabetes History: 0 Stroke History: 2 Vascular Disease History: 0 Age Score: 1 Gender Score: 1   For questions or updates, please contact [facility] Please consult [email or web address] for contact info under    Signed, Kirk Ruths, MD  11/05/2021 12:11 PM As above, patient seen and examined.  Briefly she is a 71 year old female with past medical history of CVA, paroxysmal atrial fibrillation, coronary artery disease status post coronary bypass and graft, hyperlipidemia, hypertension admitted with COVID for evaluation of cardiomyopathy, atrial fibrillation and syncope.  Patient states she typically does not have dyspnea on exertion, orthopnea, PND, pedal edema, exertional chest pain or palpitations.  She developed flulike symptoms 2 days prior to her event with fevers and chills and general malaise.  She then had a syncopal episode.  Note she was describing decreased p.o. intake prior to the event.  Duration of unconsciousness unclear.  She denies any preceding chest pain, palpitations or dyspnea.  Echocardiogram this admission shows mildly reduced LV function and patient noted to be in atrial fibrillation.  Cardiology now asked to evaluate. Electrocardiogram shows atrial fibrillation with right bundle branch block.  Echocardiogram shows ejection fraction 40 to 45% which is newly reduced compared to previous, mild right ventricular enlargement, mild RV dysfunction, mild biatrial enlargement, mild to moderate tricuspid regurgitation, trace aortic insufficiency.  Creatinine 1.14 with BUN 22, troponins are normal, initial hemoglobin 16.8 with follow-up 13.6, COVID-positive, TSH 0.247 but free T41.02.  1 persistent atrial fibrillation-patient is asymptomatic with no increased dyspnea, palpitations.  Continue apixaban.  Her heart rate is controlled with metoprolol and will transition  to Toprol 50 mg daily.  We will arrange follow-up in the office in 2 to 4 weeks.  If atrial fibrillation persists we will make decision at that time concerning cardioversion versus rate control.  Note her last ECG March 11, 2017 showed sinus rhythm.  Not clear duration of present atrial fibrillation.  2 cardiomyopathy-mildly reduced LV function.  Discontinue amlodipine and will not resume lisinopril.  Instead we will treat with Avapro 150 mg daily.  Can repeat echocardiogram in 3 to 6 months after she recovers from COVID and heart rate clearly controlled.  Hopefully LV function will have improved.  If not would need ischemia evaluation.  3 syncope-likely secondary to COVID/dehydration.  Patient should not drive for 6 months.  Will not pursue further evaluation unless she has recurrent episodes.  4 COVID-19-management per primary care.  Cardiology will sign off.  We will arrange follow-up 4 to 6 weeks after discharge with APP.  Please call with questions. Kirk Ruths, MD

## 2021-11-05 NOTE — Evaluation (Addendum)
Speech Language Pathology Evaluation Patient Details Name: TREY BEBEE MRN: [id number] DOB: 1950-11-12 Today's Date: 11/05/2021 Time: 3329-5188 SLP Time Calculation (min) (ACUTE ONLY): 28 min  Problem List:  Patient Active Problem List   Diagnosis Date Noted   Heart failure with mildly reduced ejection fraction (HFmrEF) ([place]) 11/05/2021   COVID-19 11/04/2021   Dehydration 11/04/2021   Syncope 11/04/2021   Atrial fibrillation with RVR ([place]) 11/03/2021   Chronic anticoagulation 04/24/2019   Closed displaced fracture of proximal phalanx of right little finger    Dyslipidemia, goal LDL below 70    History of stroke    Hypokalemia    History of ST elevation myocardial infarction (STEMI)    History of seizure    History of memory loss    PAF (paroxysmal atrial fibrillation) ([place]) 10/19/2013   Constipation 10/04/2013   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 07/25/12 07/28/2012   Essential hypertension 07/21/2012   Tobacco abuse 07/21/2012   Past Medical History:  Past Medical History:  Diagnosis Date   Acute ischemic left MCA stroke ([place]) 03/11/2017   Hyperlipidemia LDL goal < 70 07/24/2012   Hypertension    S/P CABG x 5 : LIMA-LAD, SVG-OM1-OM2, SVG-(Y)-DIAG-PDA. 07/25/12 07/28/2012   POSTOPERATIVE DIAGNOSIS: Severe 3-vessel coronary disease, status post  myocardial infarction.  PROCEDURE: Median sternotomy, extracorporeal circulation, coronary  artery bypass grafting x5 (left internal mammary artery to LAD,  sequential saphenous vein graft to obtuse marginal 1, obtuse marginal 2,  and posterior descending with Y graft to 1st diagonal), endoscopic vein  harvest, left thigh.     Seizures ([place])    "years ago" (03/11/2017)   STEMI (ST elevation myocardial infarction), secondary to disease of RCA, with disease of LAD and diag.  branch and chronic non dominant LCX occlusion for CABG  07/21/2012   Stroke [facility]) ?08/2012   "right sided weakness; speech isssues since" (03/11/2017)   Tobacco abuse 07/21/2012   Past Surgical History:  Past Surgical History:  Procedure Laterality Date   CARDIAC CATHETERIZATION     CESAREAN SECTION  X 1   CORONARY ARTERY BYPASS GRAFT N/A 07/25/2012   Procedure: CORONARY ARTERY BYPASS GRAFTING (CABG) times five with Left  Endoscopic Saphenous Vein Harvest and Left Internal  Mammary ;  Surgeon: Melrose Nakayama, MD;  Location: [place];  Service: Open Heart Surgery;  Laterality: N/A;   DILATION AND CURETTAGE OF UTERUS     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   LEFT HEART CATHETERIZATION WITH CORONARY ANGIOGRAM N/A 07/21/2012   Procedure: LEFT HEART CATHETERIZATION WITH CORONARY ANGIOGRAM;  Surgeon: Lorretta Harp, MD;  Location: [facility] C[facility] LAB;  Service: Cardiovascular;  Laterality: N/A;   LOOP RECORDER IMPLANT  08-15-2012   Medtronic LinQ implanted by Dr Rayann Heman for cryptogenic stroke   TEE WITHOUT CARDIOVERSION N/A 08/15/2012   Procedure: TRANSESOPHAGEAL ECHOCARDIOGRAM (TEE);  Surgeon: Josue Hector, MD;  Location[facility] ENDOSCOPY;  Service: Cardiovascular;  Laterality: N/A;   TUBAL LIGATION     HPI:  CAYDANCE KUEHNLE is a 71 y.o. female presenting with syncope. Differential for this patient's presentation of this includes episode of RVR with afib (most likely given PMH, presence of RVR in ED, quick resolution of symptoms), dehydration (likely present given orthostasis/lab values but would not completely explain syncope while sitting), ACS (less likely given no chest pain, reassuring EKG, flat troponins),  PE (less likely given no O2 requirement or chest pain or pleuritic pain), other arrhythmia and stroke (less likely given neurological exam overall reassuring in context of previous stroke, on eliquis for afib); SLE ordered to assess speech/language and cognition.   Assessment / Plan /  Recommendation Clinical Impression  Pt was assessed via the [employer] Mental Status Examination (SLUMS) with a score obtained of 11/26 with a typical score being 27/30.  Pt with baseline expressive aphasia d/t past CVA which impacted overall score.  No family available to determine baseline cognitive/linguistic function.  Pt with impaired attention and memory recall with various tasks on this assessment, but pt intermittently refused and stated she had difficulty with these d/t prior CVA.  Pt with anomia and phonemic paraphasias/neologisms and dysfluency observed during simple conversation.  OME revealed R facial weakness at rest.  Naming tasks/repetition decreased overall for word fluency/convergent/divergent naming.  ST will f/u briefly in acute setting to determine baseline function and provide compensatory strategies prn for communication.  Thank you for this consult.    SLP Assessment  SLP Recommendation/Assessment: Patient needs continued Speech Lanaguage Pathology Services SLP Visit Diagnosis: Aphasia (R47.01);Cognitive communication deficit (R41.841)    Recommendations for follow up therapy are one component of a multi-disciplinary discharge planning process, led by the attending physician.  Recommendations may be updated based on patient status, additional functional criteria and insurance authorization.    Follow Up Recommendations  Follow physician's recommendations for discharge plan and follow up therapies    Assistance Recommended at Discharge  Frequent or constant Supervision/Assistance  Functional Status Assessment Patient has had a recent decline in their functional status and demonstrates the ability to make significant improvements in function in a reasonable and predictable amount of time.  Frequency and Duration min 1 x/week  1 week      SLP Evaluation Cognition  Overall Cognitive Status: No family/caregiver present to determine baseline cognitive  functioning Arousal/Alertness: Awake/alert Orientation Level: Oriented X4 Year: 2023 Month: October Day of Week: Correct Attention: Sustained Sustained Attention: Impaired Sustained Attention Impairment: Verbal basic;Functional basic Memory: Impaired Memory Impairment: Retrieval deficit;Decreased recall of new information;Decreased short term memory Decreased Short Term Memory: Verbal basic;Functional basic Awareness: Appears intact Problem Solving: Appears intact Behaviors: Perseveration Safety/Judgment: Appears intact       Comprehension  Auditory Comprehension Overall Auditory Comprehension: Other (comment) (DTA d/t aphasia) Commands: Not tested Conversation: Simple Interfering Components: Attention;Working [employer] fast streamer;Other (comment) (aphasia) EffectiveTechniques: Extra processing time;Repetition Visual Recognition/Discrimination Discrimination: Not tested Reading Comprehension Reading Status: Not tested    Expression Expression Primary Mode of Expression: Verbal Verbal Expression Overall Verbal Expression: Impaired at baseline Level of Generative/Spontaneous Verbalization: Conversation Repetition: Impaired Level of Impairment: Sentence level Naming: Impairment Responsive: 76-100% accurate Confrontation: Not tested Convergent: 25-49% accurate Divergent: 25-49% accurate Verbal Errors: Neologisms;Perseveration;Phonemic paraphasias Pragmatics: No impairment Interfering Components: Premorbid deficit Non-Verbal Means of Communication: Not applicable Written Expression Dominant Hand: Right Written Expression: Not tested   Oral / Motor  Oral Motor/Sensory Function Overall Oral Motor/Sensory Function: Mild impairment Facial Symmetry: Abnormal symmetry right;Other (Comment) (slight) Motor Speech Overall Motor Speech: Other (comment) Respiration: Within functional limits Phonation: Normal Resonance: Within functional limits Articulation: Within functional  limitis Intelligibility: Intelligible Motor Planning: Not tested Motor Speech Errors: Not applicable            Pat Sonal Dorwart,M.S., CCC-SLP 11/05/2021, 3:20 PM

## 2021-11-05 NOTE — Progress Notes (Signed)
Physical Therapy Treatment Patient Details Name: [name] [name] MRN: [id number] DOB: September 29, 1950 Today's Date: 11/05/2021   History of Present Illness 71 yo female with onset of syncopal episode at hair salon was admitted on 10/3, noted mild Covid 19 dx.  Have concerns for source of syncope with heart, has dehydration, with non functional loop recorder.  PMHx:  stroke with R hemiparesis, aphasia,    PT Comments    Pt received supine and agreeable to session with emphasis on safe transfers and gait progress for increased activity tolerance and LE strength. Pt grossly min guard throughout gait training with no AD with no instances of LOB. Pt aldo able to navigate and negotaite "obstacle course"  in room with turns around chair and through tight spaces with min guard without fault. NO instances/episodes of Les crossing over with fair stability throughout all turns. Pt with good tolerance for LE therex for increased ROM and strength. Pt continues to benefit from skilled PT services to progress toward functional mobility goals.    Recommendations for follow up therapy are one component of a multi-disciplinary discharge planning process, led by the attending physician.  Recommendations may be updated based on patient status, additional functional criteria and insurance authorization.  Follow Up Recommendations  Outpatient PT     Assistance Recommended at Discharge Frequent or constant Supervision/Assistance  Patient can return home with the following A little help with walking and/or transfers;A little help with bathing/dressing/bathroom;Assistance with cooking/housework;Assist for transportation   Equipment Recommendations  Other (comment) (hemiwalker)    Recommendations for Other Services       Precautions / Restrictions Precautions Precautions: Fall Precaution Comments: R hemiparesis and incoordination Restrictions Weight Bearing Restrictions: No     Mobility  Bed Mobility Overal  bed mobility: Needs Assistance Bed Mobility: Supine to Sit, Sit to Supine     Supine to sit: Supervision Sit to supine: Supervision   General bed mobility comments: no physical assist needed    Transfers Overall transfer level: Needs assistance Equipment used: None Transfers: Sit to/from Stand Sit to Stand: Min guard           General transfer comment: min assist to steady initially, able to perform x5 without fault    Ambulation/Gait Ambulation/Gait assistance: Min guard Gait Distance (Feet): 80 Feet Assistive device: None Gait Pattern/deviations: Step-through pattern, Decreased stride length Gait velocity: reduced     General Gait Details: ambulation in room without LOB, able to navigate and negotaite "obstacle course" with turns around chair and through tight spaces, no episode of corssing LEs during turns   Chief Strategy Officer    Modified Rankin (Stroke Patients Only)       Balance Overall balance assessment: Needs assistance Sitting-balance support: Feet supported Sitting balance-Leahy Scale: Good     Standing balance support: No upper extremity supported, During functional activity Standing balance-Leahy Scale: Good Standing balance comment: able to mobilize in room without AD                            Cognition Arousal/Alertness: Awake/alert Behavior During Therapy: WFL for tasks assessed/performed Overall Cognitive Status: No family/caregiver present to determine baseline cognitive functioning                                 General Comments: some problem solving deficits but  overall functional        Exercises General Exercises - Lower Extremity Long Arc Quad: AROM, Right, Left, 20 reps, Seated Hip ABduction/ADduction: AROM, Right, Left, 20 reps, Supine Hip Flexion/Marching: AROM, Right, Left, 20 reps, Seated    General Comments General comments (skin integrity, edema, etc.): VSS on RA       Pertinent Vitals/Pain Pain Assessment Pain Assessment: No/denies pain    Home Living                          Prior Function            PT Goals (current goals can now be found in the care plan section) Acute Rehab PT Goals Patient Stated Goal: to go home PT Goal Formulation: With patient Time For Goal Achievement: 11/18/21    Frequency    Min 3X/week      PT Plan      Co-evaluation              AM-PAC PT "6 Clicks" Mobility   Outcome Measure  Help needed turning from your back to your side while in a flat bed without using bedrails?: A Little Help needed moving from lying on your back to sitting on the side of a flat bed without using bedrails?: A Little Help needed moving to and from a bed to a chair (including a wheelchair)?: A Little Help needed standing up from a chair using your arms (e.g., wheelchair or bedside chair)?: A Little Help needed to walk in hospital room?: A Little Help needed climbing 3-5 steps with a railing? : A Lot 6 Click Score: 17    End of Session   Activity Tolerance: Patient tolerated treatment well Patient left: in bed;with call bell/phone within reach;with bed alarm set Nurse Communication: Mobility status PT Visit Diagnosis: Unsteadiness on feet (R26.81)     Time: 3716-9678 PT Time Calculation (min) (ACUTE ONLY): 24 min  Charges:  $Gait Training: 8-22 mins $Therapeutic Exercise: 8-22 mins                     Raeley Gilmore R. PTA Acute Rehabilitation Services Office: [phone]    Catalina Antigua 11/05/2021, 11:24 AM

## 2021-11-05 NOTE — Assessment & Plan Note (Addendum)
Echo yesterday showed estimated LVEF of 40-45%, LVEF 55-60% 3 years ago.  Patient's current state complicated by COVID infection.  Patient is euvolemic on exam.  Currently on home ACE and beta-blocker.  No MRA nor SGLT2. - Cardiology consulted, appreciate recs - I's and O's and daily weight - Cardiac monitoring

## 2022-06-22 ENCOUNTER — Emergency Department: Payer: 59

## 2022-06-22 ENCOUNTER — Other Ambulatory Visit: Payer: Self-pay

## 2022-06-22 ENCOUNTER — Inpatient Hospital Stay
Admission: EM | Admit: 2022-06-22 | Discharge: 2022-06-24 | DRG: 064 | Disposition: A | Discharge status: Home / Self Care | Payer: 59 | Attending: Student | Admitting: Student

## 2022-06-22 DIAGNOSIS — Z951 Presence of aortocoronary bypass graft: Secondary | ICD-10-CM

## 2022-06-22 DIAGNOSIS — R569 Unspecified convulsions: Secondary | ICD-10-CM | POA: Diagnosis not present

## 2022-06-22 DIAGNOSIS — R29810 Facial weakness: Secondary | ICD-10-CM | POA: Diagnosis present

## 2022-06-22 DIAGNOSIS — I618 Other nontraumatic intracerebral hemorrhage: Secondary | ICD-10-CM | POA: Diagnosis not present

## 2022-06-22 DIAGNOSIS — I639 Cerebral infarction, unspecified: Principal | ICD-10-CM | POA: Diagnosis present

## 2022-06-22 DIAGNOSIS — R638 Other symptoms and signs concerning food and fluid intake: Secondary | ICD-10-CM | POA: Diagnosis not present

## 2022-06-22 DIAGNOSIS — I69351 Hemiplegia and hemiparesis following cerebral infarction affecting right dominant side: Secondary | ICD-10-CM

## 2022-06-22 DIAGNOSIS — I700 Atherosclerosis of aorta: Secondary | ICD-10-CM | POA: Diagnosis present

## 2022-06-22 DIAGNOSIS — R4701 Aphasia: Secondary | ICD-10-CM | POA: Diagnosis present

## 2022-06-22 DIAGNOSIS — Z8249 Family history of ischemic heart disease and other diseases of the circulatory system: Secondary | ICD-10-CM | POA: Diagnosis not present

## 2022-06-22 DIAGNOSIS — I63232 Cerebral infarction due to unspecified occlusion or stenosis of left carotid arteries: Principal | ICD-10-CM | POA: Diagnosis present

## 2022-06-22 DIAGNOSIS — I252 Old myocardial infarction: Secondary | ICD-10-CM | POA: Diagnosis not present

## 2022-06-22 DIAGNOSIS — Z7901 Long term (current) use of anticoagulants: Secondary | ICD-10-CM

## 2022-06-22 DIAGNOSIS — E559 Vitamin D deficiency, unspecified: Secondary | ICD-10-CM | POA: Diagnosis present

## 2022-06-22 DIAGNOSIS — E46 Unspecified protein-calorie malnutrition: Secondary | ICD-10-CM | POA: Diagnosis not present

## 2022-06-22 DIAGNOSIS — E162 Hypoglycemia, unspecified: Secondary | ICD-10-CM | POA: Diagnosis present

## 2022-06-22 DIAGNOSIS — Z823 Family history of stroke: Secondary | ICD-10-CM | POA: Diagnosis not present

## 2022-06-22 DIAGNOSIS — I619 Nontraumatic intracerebral hemorrhage, unspecified: Secondary | ICD-10-CM | POA: Diagnosis not present

## 2022-06-22 DIAGNOSIS — E43 Unspecified severe protein-calorie malnutrition: Secondary | ICD-10-CM | POA: Diagnosis present

## 2022-06-22 DIAGNOSIS — Z720 Tobacco use: Secondary | ICD-10-CM | POA: Diagnosis not present

## 2022-06-22 DIAGNOSIS — Z681 Body mass index (BMI) 19 or less, adult: Secondary | ICD-10-CM | POA: Diagnosis not present

## 2022-06-22 DIAGNOSIS — Z515 Encounter for palliative care: Secondary | ICD-10-CM | POA: Diagnosis not present

## 2022-06-22 DIAGNOSIS — I2510 Atherosclerotic heart disease of native coronary artery without angina pectoris: Secondary | ICD-10-CM | POA: Diagnosis present

## 2022-06-22 DIAGNOSIS — F17210 Nicotine dependence, cigarettes, uncomplicated: Secondary | ICD-10-CM | POA: Diagnosis present

## 2022-06-22 DIAGNOSIS — I472 Ventricular tachycardia, unspecified: Secondary | ICD-10-CM | POA: Diagnosis not present

## 2022-06-22 DIAGNOSIS — I480 Paroxysmal atrial fibrillation: Secondary | ICD-10-CM | POA: Diagnosis present

## 2022-06-22 DIAGNOSIS — Z7189 Other specified counseling: Secondary | ICD-10-CM | POA: Diagnosis not present

## 2022-06-22 DIAGNOSIS — Z79899 Other long term (current) drug therapy: Secondary | ICD-10-CM

## 2022-06-22 DIAGNOSIS — I6389 Other cerebral infarction: Secondary | ICD-10-CM | POA: Diagnosis not present

## 2022-06-22 DIAGNOSIS — H53461 Homonymous bilateral field defects, right side: Secondary | ICD-10-CM | POA: Diagnosis present

## 2022-06-22 DIAGNOSIS — Z87898 Personal history of other specified conditions: Secondary | ICD-10-CM

## 2022-06-22 DIAGNOSIS — I10 Essential (primary) hypertension: Secondary | ICD-10-CM | POA: Diagnosis not present

## 2022-06-22 DIAGNOSIS — I5022 Chronic systolic (congestive) heart failure: Secondary | ICD-10-CM | POA: Diagnosis not present

## 2022-06-22 DIAGNOSIS — I610 Nontraumatic intracerebral hemorrhage in hemisphere, subcortical: Secondary | ICD-10-CM | POA: Diagnosis not present

## 2022-06-22 DIAGNOSIS — Z789 Other specified health status: Secondary | ICD-10-CM | POA: Diagnosis not present

## 2022-06-22 DIAGNOSIS — E785 Hyperlipidemia, unspecified: Secondary | ICD-10-CM | POA: Diagnosis not present

## 2022-06-22 DIAGNOSIS — J439 Emphysema, unspecified: Secondary | ICD-10-CM | POA: Diagnosis present

## 2022-06-22 LAB — DIFFERENTIAL
Abs Immature Granulocytes: 0.01 10*3/uL (ref 0.00–0.07)
Basophils Absolute: 0.1 10*3/uL (ref 0.0–0.1)
Basophils Relative: 1 %
Eosinophils Absolute: 0.1 10*3/uL (ref 0.0–0.5)
Eosinophils Relative: 2 %
Immature Granulocytes: 0 %
Lymphocytes Relative: 41 %
Lymphs Abs: 2.3 10*3/uL (ref 0.7–4.0)
Monocytes Absolute: 0.5 10*3/uL (ref 0.1–1.0)
Monocytes Relative: 8 %
Neutro Abs: 2.7 10*3/uL (ref 1.7–7.7)
Neutrophils Relative %: 48 %

## 2022-06-22 LAB — CBG MONITORING, ED
Glucose-Capillary: 63 mg/dL — ABNORMAL LOW (ref 70–99)
Glucose-Capillary: 60 mg/dL — ABNORMAL LOW (ref 70–99)

## 2022-06-22 LAB — COMPREHENSIVE METABOLIC PANEL
ALT: 13 U/L (ref 0–44)
AST: 27 U/L (ref 15–41)
Albumin: 3.5 g/dL (ref 3.5–5.0)
Alkaline Phosphatase: 73 U/L (ref 38–126)
Anion gap: 7 (ref 5–15)
BUN: 12 mg/dL (ref 8–23)
CO2: 25 mmol/L (ref 22–32)
Calcium: 9.1 mg/dL (ref 8.9–10.3)
Chloride: 104 mmol/L (ref 98–111)
Creatinine, Ser: 0.95 mg/dL (ref 0.44–1.00)
GFR, Estimated: >60 mL/min (ref >60)
Glucose, Bld: 84 mg/dL (ref 70–99)
Potassium: 4.8 mmol/L (ref 3.5–5.1)
Sodium: 136 mmol/L (ref 135–145)
Total Bilirubin: 1.2 mg/dL (ref 0.3–1.2)
Total Protein: 7.3 g/dL (ref 6.5–8.1)

## 2022-06-22 LAB — URINALYSIS, ROUTINE W REFLEX MICROSCOPIC
Bilirubin Urine: NEGATIVE
Glucose, UA: NEGATIVE mg/dL
Ketones, ur: NEGATIVE mg/dL
Leukocytes,Ua: NEGATIVE
Nitrite: NEGATIVE
Protein, ur: NEGATIVE mg/dL
Specific Gravity, Urine: 1.043 — ABNORMAL HIGH (ref 1.005–1.030)
pH: 7 (ref 5.0–8.0)

## 2022-06-22 LAB — CBC
HCT: 43.5 % (ref 36.0–46.0)
Hemoglobin: 14 g/dL (ref 12.0–15.0)
MCH: 30.4 pg (ref 26.0–34.0)
MCHC: 32.2 g/dL (ref 30.0–36.0)
MCV: 94.4 fL (ref 80.0–100.0)
Platelets: 217 10*3/uL (ref 150–400)
RBC: 4.61 MIL/uL (ref 3.87–5.11)
RDW: 14.3 % (ref 11.5–15.5)
WBC: 5.7 10*3/uL (ref 4.0–10.5)
nRBC: 0 % (ref 0.0–0.2)

## 2022-06-22 LAB — PROTIME-INR
INR: 1.1 (ref 0.8–1.2)
Prothrombin Time: 14 seconds (ref 11.4–15.2)

## 2022-06-22 LAB — HEMOGLOBIN A1C
Hgb A1c MFr Bld: 5.3 % (ref 4.8–5.6)
Mean Plasma Glucose: 105.41 mg/dL

## 2022-06-22 LAB — ETHANOL: Alcohol, Ethyl (B): <10 mg/dL (ref <10)

## 2022-06-22 LAB — GLUCOSE, CAPILLARY
Glucose-Capillary: 89 mg/dL (ref 70–99)
Glucose-Capillary: 123 mg/dL — ABNORMAL HIGH (ref 70–99)

## 2022-06-22 LAB — APTT: aPTT: 26 seconds (ref 24–36)

## 2022-06-22 MED ORDER — ACETAMINOPHEN 650 MG RE SUPP: 650.0000 mg | RECTAL | Status: DC | PRN

## 2022-06-22 MED ORDER — IOHEXOL 350 MG/ML SOLN
80.0000 mL | Freq: Once | INTRAVENOUS | Status: AC | PRN
  Administered 2022-06-22: 80 mL via INTRAVENOUS

## 2022-06-22 MED ORDER — ATORVASTATIN CALCIUM 20 MG PO TABS
80.0000 mg | ORAL_TABLET | Freq: Every day | ORAL | Status: DC
  Administered 2022-06-22 – 2022-06-24 (×3): 80 mg via ORAL
  Filled 2022-06-22 (×3): qty 4

## 2022-06-22 MED ORDER — ONDANSETRON HCL 4 MG/2ML IJ SOLN: 4.0000 mg | Freq: Three times a day (TID) | INTRAMUSCULAR | Status: DC | PRN

## 2022-06-22 MED ORDER — NICOTINE 21 MG/24HR TD PT24
21.0000 mg | MEDICATED_PATCH | Freq: Every day | TRANSDERMAL | Status: DC
  Filled 2022-06-22 (×2): qty 1

## 2022-06-22 MED ORDER — SODIUM CHLORIDE 0.9% FLUSH
3.0000 mL | Freq: Once | INTRAVENOUS | Status: AC
  Administered 2022-06-22: 3 mL via INTRAVENOUS

## 2022-06-22 MED ORDER — APIXABAN 5 MG PO TABS
5.0000 mg | ORAL_TABLET | Freq: Two times a day (BID) | ORAL | Status: DC
  Administered 2022-06-22 – 2022-06-24 (×4): 5 mg via ORAL
  Filled 2022-06-22 (×5): qty 1

## 2022-06-22 MED ORDER — ACETAMINOPHEN 325 MG PO TABS: 650.0000 mg | ORAL_TABLET | ORAL | Status: DC | PRN

## 2022-06-22 MED ORDER — HYDRALAZINE HCL 20 MG/ML IJ SOLN: 5.0000 mg | INTRAMUSCULAR | Status: DC | PRN

## 2022-06-22 MED ORDER — DEXTROSE 50 % IV SOLN: 50.0000 mL | INTRAVENOUS | Status: DC | PRN

## 2022-06-22 MED ORDER — DEXTROSE-SODIUM CHLORIDE 5-0.45 % IV SOLN: INTRAVENOUS | Status: DC

## 2022-06-22 MED ORDER — IRBESARTAN 150 MG PO TABS
150.0000 mg | ORAL_TABLET | Freq: Every day | ORAL | Status: DC
  Administered 2022-06-22: 150 mg via ORAL
  Filled 2022-06-22 (×2): qty 1

## 2022-06-22 MED ORDER — METOPROLOL SUCCINATE ER 50 MG PO TB24
50.0000 mg | ORAL_TABLET | Freq: Every day | ORAL | Status: DC
  Administered 2022-06-22: 50 mg via ORAL
  Filled 2022-06-22: qty 1

## 2022-06-22 MED ORDER — SENNOSIDES-DOCUSATE SODIUM 8.6-50 MG PO TABS: 1.0000 | ORAL_TABLET | Freq: Every evening | ORAL | Status: DC | PRN

## 2022-06-22 MED ORDER — ACETAMINOPHEN 160 MG/5ML PO SOLN: 650.0000 mg | ORAL | Status: DC | PRN

## 2022-06-22 MED ORDER — STROKE: EARLY STAGES OF RECOVERY BOOK: Freq: Once | Status: AC

## 2022-06-22 MED ORDER — DONEPEZIL HCL 5 MG PO TABS
5.0000 mg | ORAL_TABLET | Freq: Every day | ORAL | Status: DC
  Administered 2022-06-22 – 2022-06-23 (×2): 5 mg via ORAL
  Filled 2022-06-22 (×2): qty 1

## 2022-06-22 NOTE — ED Provider Notes (Signed)
[facility] Provider Note    Event Date/Time   First MD Initiated Contact with Patient 06/22/22 [phone]     (approximate)   History   Stroke Symptoms   HPI  [name] [name] is a 72 y.o. female who presents to the emergency department via EMS as a code stroke because of concerns for increased confusion and speech difficulty.  Patient does have history of stroke a number of years ago.  History is somewhat limited.  She says that she was not feeling completely normal when she woke up although apparently family stated last known well was 830 this morning.  Patient does have baseline tremors to the right upper extremity.     Physical Exam   Triage Vital Signs: ED Triage Vitals [06/22/22 0920]  Enc Vitals Group     BP      Pulse      Resp      Temp      Temp src      SpO2      Weight      Height      Head Circumference      Peak Flow      Pain Score 0     Pain Loc      Pain Edu?      Excl. in GC?     Most recent vital signs: There were no vitals filed for this visit.  General: Awake, alert, not completely oriented. CV:  Good peripheral perfusion. Resp:  Normal effort.  Abd:  No distention.    ED Results / Procedures / Treatments   Labs (all labs ordered are listed, but only abnormal results are displayed) Labs Reviewed  CBG MONITORING, ED - Abnormal; Notable for the following components:      Result Value   Glucose-Capillary 63 (*)    All other components within normal limits  PROTIME-INR  APTT  CBC  DIFFERENTIAL  COMPREHENSIVE METABOLIC PANEL  ETHANOL  I-STAT CREATININE, ED     EKG  I, Phineas Semen, attending physician, personally viewed and interpreted this EKG  EKG Time: 1301 Rate: 96 Rhythm: atrial fibrillation Axis: left axis deviation Intervals: qtc 495 QRS: RBBB ST changes: no st elevation Impression: abnormal ekg    RADIOLOGY I independently interpreted and visualized the CT head. My interpretation: No  acute bleed Radiology interpretation:  IMPRESSION:  No acute hemorrhage or evidence of evolving large vessel territory  infarct. ASPECT score is 10, accounting for old infarcts in the left  MCA and PCA territories.      PROCEDURES:  Critical Care performed: No    MEDICATIONS ORDERED IN ED: Medications  sodium chloride flush (NS) 0.9 % injection 3 mL (has no administration in time range)     IMPRESSION / MDM / ASSESSMENT AND PLAN / ED COURSE  I reviewed the triage vital signs and the nursing notes.                              Differential diagnosis includes, but is not limited to, CVA, recrudescence, infection, anemia  Patient's presentation is most consistent with acute presentation with potential threat to life or bodily function.   The patient is on the cardiac monitor to evaluate for evidence of arrhythmia and/or significant heart rate changes.  Patient presented to the emergency today as a code stroke.  Unfortunately history is somewhat limited is unclear exactly when symptoms started.  Dr.  Fitzpatrick with neurology did evaluate patient upon arrival.  This time patient is not a candidate for TNK.  Imaging reveals multiple occlusions.  Discussed with Dr. Clyde Lundborg with the hospitalist service who will plan on admission.      FINAL CLINICAL IMPRESSION(S) / ED DIAGNOSES   Final diagnoses:  Cerebrovascular accident (CVA), unspecified mechanism (HCC)     Note:  This document was prepared using Dragon voice recognition software and may include unintentional dictation errors.    Phineas Semen, MD 06/22/22 [phone]

## 2022-06-22 NOTE — Evaluation (Signed)
Occupational Therapy Evaluation Patient Details Name: [name] [name] MRN: [id number] DOB: Nov 29, 1950 Today's Date: 06/22/2022   History of Present Illness 72 y.o. female with a history of afib and previous stroke, most recent in 2019, who presents with difficulty speaking.  The patient states that she has had noticeably more difficulty over the past 3 days, with symptoms starting on Saturday.  This morning she stood up, and her significant other noticed that she was not acting quite right while standing and therefore called 911 and she was brought in as a code stroke.  The patient is adamant that she has had the symptoms since Saturday, and they have been steady since that time.   Clinical Impression   Pt seen for OT evaluation this date. Prior to hospital admission, pt reports indep without AD for mobility and basic ADL and medication mgt. She does not drive. Family present and verifies this. Pt endorses R deficits and expressive language deficits from prior CVA and reports not feeling too far off from baseline at time of evaluation. Pt lives with her boyfriend in a hotel with a walk in shower and built in bench. Pt endorses 1 fall just prior to admission when she went to pay her rent and fell. Currently pt demonstrates impairments in RUE/RLE strength, coordination, and ROM. Pt reports she is R hand dominant at baseline however since previous CVA she has been using L hand for self feeding. Pt tends to keep RUE in flexed position but is able to straighten it out a majority of the way (elbow extension to neutral, shoulder flexion to 90+, composite finger extension almost fully and able to reach full extension with active assist. RUE grossly 4-/5 to 4/5 and LUE grossly 4+/5. Pt completes bed mobility supervision and VC to incorporate RUE into task and able to weight bear through BUE to help her scoot towards the EOB. Pt stood with CGA and no AD, able to march in place with CGA-MIN A and ambulated ~4' to RW  with CGA and no overt LOB. Pt and family report pt appears near baseline for quality of mobility. Recommend additional skilled OT services to maximize indep and safety with ADL and ADL mobility. RN updated.    Recommendations for follow up therapy are one component of a multi-disciplinary discharge planning process, led by the attending physician.  Recommendations may be updated based on patient status, additional functional criteria and insurance authorization.   Assistance Recommended at Discharge Frequent or constant Supervision/Assistance  Patient can return home with the following A little help with walking and/or transfers;A little help with bathing/dressing/bathroom;Assist for transportation;Assistance with cooking/housework;Help with stairs or ramp for entrance;Direct supervision/assist for medications management    Functional Status Assessment  Patient has had a recent decline in their functional status and demonstrates the ability to make significant improvements in function in a reasonable and predictable amount of time.  Equipment Recommendations  None recommended by OT    Recommendations for Other Services       Precautions / Restrictions Precautions Precautions: Fall Restrictions Weight Bearing Restrictions: No      Mobility Bed Mobility Overal bed mobility: Needs Assistance Bed Mobility: Supine to Sit     Supine to sit: Supervision     General bed mobility comments: increased time/effort, VC to incorporate RUE into task and able to bear weight through RUE and LUE to help push herself towards the EOB    Transfers Overall transfer level: Needs assistance Equipment used: None Transfers: Sit to/from Stand  Sit to Stand: Min guard                  Balance Overall balance assessment: Needs assistance Sitting-balance support: Feet supported, No upper extremity supported Sitting balance-Leahy Scale: Fair     Standing balance support: Single extremity  supported, During functional activity Standing balance-Leahy Scale: Fair                             ADL either performed or assessed with clinical judgement   ADL Overall ADL's : Needs assistance/impaired                                       General ADL Comments: Pt required MIN A to initiate donning sock over R foot, indep with L foot, able to doff her socks without assist, CGA for toilet transfers, set up for grooming and self feeding     Vision         Perception     Praxis      Pertinent Vitals/Pain Pain Assessment Pain Assessment: No/denies pain     Hand Dominance Right (uses L hand for self feeding due to hx of contracture)   Extremity/Trunk Assessment Upper Extremity Assessment Upper Extremity Assessment: Generalized weakness;RUE deficits/detail;LUE deficits/detail RUE Deficits / Details: hx hemi, tends to keep RUE in flexed position but is able to straighten her arm ~90% of the way to neutral, some mild flexion remaining in fingers, able to bear weight through RUE during bed mobility, grossly 4-/5 to 4/5 with MMT, sensation intact, decr FMC RUE Sensation: WNL RUE Coordination: decreased fine motor;decreased gross motor LUE Deficits / Details: grossly 4+/5 LUE Sensation: WNL LUE Coordination: WNL   Lower Extremity Assessment Lower Extremity Assessment: Generalized weakness;Defer to PT evaluation       Communication Communication Communication: Expressive difficulties (pt and family endorse this is not too far from baseline)   Cognition Arousal/Alertness: Awake/alert Behavior During Therapy: WFL for tasks assessed/performed Overall Cognitive Status: History of cognitive impairments - at baseline                                 General Comments: alert, oriented to place with visual cue, person, situation, follows commands     General Comments       Exercises Other Exercises Other Exercises: Pt encouraged to  utilize RUE and provided with pillow for positioning to improve more neutral resting posture   Shoulder Instructions      Home Living Family/patient expects to be discharged to:: Other (Comment)     Type of Home: Other(Comment)                           Additional Comments: living in a hotel wiht her boyfriend  Lives With: Significant other    Prior Functioning/Environment Prior Level of Function : History of Falls (last six months)             Mobility Comments: denies AD use, 1 fall recently just prior to this admission ADLs Comments: Pt reports mod indep with ADL, indep with med mgt, boyfriend assists with some IADL        OT Problem List: Decreased strength;Decreased coordination;Decreased range of motion;Impaired balance (sitting and/or standing);Decreased knowledge of use of DME  or AE;Impaired UE functional use      OT Treatment/Interventions: Self-care/ADL training;Therapeutic exercise;Therapeutic activities;Neuromuscular education;DME and/or AE instruction;Patient/family education;Balance training;Manual therapy    OT Goals(Current goals can be found in the care plan section) Acute Rehab OT Goals Patient Stated Goal: go home OT Goal Formulation: With patient/family Time For Goal Achievement: 07/06/22 Potential to Achieve Goals: Good  OT Frequency: Min 2X/week    Co-evaluation              AM-PAC OT "6 Clicks" Daily Activity     Outcome Measure Help from another person eating meals?: None Help from another person taking care of personal grooming?: A Little Help from another person toileting, which includes using toliet, bedpan, or urinal?: A Little Help from another person bathing (including washing, rinsing, drying)?: A Little Help from another person to put on and taking off regular upper body clothing?: A Little Help from another person to put on and taking off regular lower body clothing?: A Little 6 Click Score: 19   End of Session  Equipment Utilized During Treatment: Gait belt Nurse Communication: Mobility status  Activity Tolerance: Patient tolerated treatment well Patient left: in chair;with call bell/phone within reach;with chair alarm set  OT Visit Diagnosis: Hemiplegia and hemiparesis Hemiplegia - Right/Left: Right Hemiplegia - dominant/non-dominant: Dominant Hemiplegia - caused by: Cerebral infarction                Time: 0981-1914 OT Time Calculation (min): 35 min Charges:  OT General Charges $OT Visit: 1 Visit OT Evaluation $OT Eval Moderate Complexity: 1 Mod OT Treatments $Self Care/Home Management : 8-22 mins  Arman Filter., MPH, MS, OTR/L ascom [phone] 06/22/22, 5:04 PM

## 2022-06-22 NOTE — Code Documentation (Signed)
CODE STROKE- PHARMACY COMMUNICATION   Time CODE STROKE called/page received:[phone]  Time response to CODE STROKE was made (in person or via phone): 0911  Time Stroke Kit retrieved from Pyxis:not required / not a candidate for IV Thrombolytic due to on Eliquis, per MD. Patient is not a candidate for IR due to outside window   Name of Provider/Nurse contacted:Kirkpatrick, MD  Past Medical History:  Diagnosis Date   Acute ischemic left MCA stroke (HCC) 03/11/2017   Hyperlipidemia LDL goal < 70 07/24/2012   Hypertension    S/P CABG x 5 : LIMA-LAD, SVG-OM1-OM2, SVG-(Y)-DIAG-PDA. 07/25/12 07/28/2012   POSTOPERATIVE DIAGNOSIS: Severe 3-vessel coronary disease, status post  myocardial infarction.  PROCEDURE: Median sternotomy, extracorporeal circulation, coronary  artery bypass grafting x5 (left internal mammary artery to LAD,  sequential saphenous vein graft to obtuse marginal 1, obtuse marginal 2,  and posterior descending with Y graft to 1st diagonal), endoscopic vein  harvest, left thigh.     Seizures (HCC)    "years ago" (03/11/2017)   STEMI (ST elevation myocardial infarction), secondary to disease of RCA, with disease of LAD and diag. branch and chronic non dominant LCX occlusion for CABG  07/21/2012   Stroke [facility]) ?08/2012   "right sided weakness; speech isssues since" (03/11/2017)   Tobacco abuse 07/21/2012   Prior to Admission medications   Medication Sig Start Date End Date Taking? Authorizing Provider  apixaban (ELIQUIS) 5 MG TABS tablet Take 1 tablet (5 mg total) by mouth 2 (two) times daily. 03/21/17   Angiulli, Mcarthur Rossetti, PA-C  atorvastatin (LIPITOR) 80 MG tablet Take 80 mg by mouth daily.    [provider]  donepezil (ARICEPT) 5 MG tablet Take 1 tablet (5 mg total) by mouth at bedtime. 03/21/17   Angiulli, Mcarthur Rossetti, PA-C  irbesartan (AVAPRO) 150 MG tablet Take 1 tablet (150 mg total) by mouth daily. 11/06/21   Levin Erp, MD  metoprolol succinate (TOPROL-XL) 50 MG 24 hr tablet  Take 1 tablet (50 mg total) by mouth daily. Take with or immediately following a meal. 11/05/21   Levin Erp, MD    Lowella Bandy ,PharmD Clinical Pharmacist  06/22/2022  9:10 AM

## 2022-06-22 NOTE — H&P (Signed)
History and Physical    [name] [name] ZOX:[id number] DOB: 01-16-1951 DOA: 06/22/2022  Referring MD/NP/PA:   PCP: Preston Fleeting, MD   Patient coming from:  The patient is coming from home.     Chief Complaint: Difficulty speaking  HPI: [name] [name] is a 72 y.o. 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 , loop recorder implant in 2014, cardiac cath, A fib on Eliquis, memory loss, who presents with difficulty speaking  Patient has hx of memory loss and difficulty speaking, is unable to provide accurate medical history. I have tried to call her sister and significant other without success.  I could not leave message since message box is not set up. Therefore, most of the history is obtained by discussing the case with ED physician, per EMS report, and with the nursing staff.   Per report, patient is from [facility] where she was LKW between Sat 5/18-Sun 5/19. Pt was found by family to have more difficulty speaking at about 8:30 when she was making coffee. Pt states that she has difficulty speaking for at least x 2-3 days. Her difficulty speaking has been persistent.  She has mild right arm weakness on my examination and facial droop.  Denies chest pain, cough, shortness of breath.  No nausea vomiting, diarrhea or abdominal pain.  Data reviewed independently and ED Course: pt was found to have WBC 5.7, INR 1.1, PTT 26, GFR> 60, temperature normal, blood pressure 154/100, heart rate 95, 18, oxygen saturation 100% on room air.  CT head negative.  MRI of the brain showed acute stroke.  Patient is admitted to telemetry bed as inpatient.  Dr. Amada Jupiter of neurology is consulted.   MRI and MRA: 1. Acute infarct in the medial aspect of the left prefrontal gyrus. 2. Redemonstrated occlusion of the left ICA with reconstitution at the level of the  communicating segment. 3. Occlusion of an M3 branch of the right superior M2 division in the right Sylvian fissure. 4. Moderate stenosis of the right PCA superior P3 division.    CTA of head and neck 1. Occlusion of the left cervical ICA at its origin. Partial reconstitution of the left ICA communicating segment by collaterals. Paucity of distal branches in the left MCA territory. 2. Possible occlusion of a right MCA M2/3 branch. 3. Infarct core of 4 mL in the left MCA territory with 66 mL of surrounding ischemic penumbra, including some ischemic penumbra in the anterior right MCA territory. 4. Densely calcified plaque results in approximately 75% stenosis of the proximal right cervical ICA. 5. 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 6. 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   Aortic Atherosclerosis (ICD10-I70.0) and Emphysema (ICD10-J43.9).        EKG:  Not done in ED, will get one.      Review of Systems:   General: no fevers, chills, no body weight gain, fatigue HEENT: no blurry vision, hearing changes or sore throat Respiratory: no dyspnea, coughing, wheezing CV: no chest pain, no palpitations GI: no nausea, vomiting, abdominal pain, diarrhea, constipation GU: no dysuria, burning on urination, increased urinary frequency, hematuria  Ext: no leg edema Neuro: No vision change or hearing loss. has difficulty speaking and right arm weakness. Right facial droop Skin: no rash, no skin tear. MSK: No muscle spasm, no  deformity, no limitation of range of movement in spin Heme: No easy bruising.  Travel history: No recent long distant travel.   Allergy: No Known Allergies  Past Medical History:  Diagnosis Date   Acute ischemic left MCA stroke (HCC) 03/11/2017   Hyperlipidemia LDL goal < 70 07/24/2012   Hypertension    S/P CABG x 5 : LIMA-LAD, SVG-OM1-OM2, SVG-(Y)-DIAG-PDA. 07/25/12 07/28/2012    POSTOPERATIVE DIAGNOSIS: Severe 3-vessel coronary disease, status post  myocardial infarction.  PROCEDURE: Median sternotomy, extracorporeal circulation, coronary  artery bypass grafting x5 (left internal mammary artery to LAD,  sequential saphenous vein graft to obtuse marginal 1, obtuse marginal 2,  and posterior descending with Y graft to 1st diagonal), endoscopic vein  harvest, left thigh.     Seizures (HCC)    "years ago" (03/11/2017)   STEMI (ST elevation myocardial infarction), secondary to disease of RCA, with disease of LAD and diag. branch and chronic non dominant LCX occlusion for CABG  07/21/2012   Stroke [facility]) ?08/2012   "right sided weakness; speech isssues since" (03/11/2017)   Tobacco abuse 07/21/2012    Past Surgical History:  Procedure Laterality Date   CARDIAC CATHETERIZATION     CESAREAN SECTION  X 1   CORONARY ARTERY BYPASS GRAFT N/A 07/25/2012   Procedure: CORONARY ARTERY BYPASS GRAFTING (CABG) times five with Left  Endoscopic Saphenous Vein Harvest and Left Internal  Mammary ;  Surgeon: Loreli Slot, MD;  Location: [facility] OR;  Service: Open Heart Surgery;  Laterality: N/A;   DILATION AND CURETTAGE OF UTERUS     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   LEFT HEART CATHETERIZATION WITH CORONARY ANGIOGRAM N/A 07/21/2012   Procedure: LEFT HEART CATHETERIZATION WITH CORONARY ANGIOGRAM;  Surgeon: [name], MD;  Location: [facility] CATH LAB;  Service: Cardiovascular;  Laterality: N/A;   LOOP RECORDER IMPLANT  08-15-2012   Medtronic LinQ implanted by Dr Johney Frame for cryptogenic stroke   TEE WITHOUT CARDIOVERSION N/A 08/15/2012   Procedure: TRANSESOPHAGEAL ECHOCARDIOGRAM (TEE);  Surgeon: Wendall Stade, MD;  Location: [facility] ENDOSCOPY;  Service: Cardiovascular;  Laterality: N/A;   TUBAL LIGATION      Social History:  reports that she has been smoking cigarettes. She started smoking about 9 years ago. She has a 51.00 pack-year smoking history. She has never used smokeless tobacco. She  reports that she does not drink alcohol and does not use drugs.  Family History:  Family History  Problem Relation Age of Onset   Hypertension Mother    Stroke Brother        Died at age 88 secondary to MI   CAD Brother      Prior to Admission medications   Medication Sig Start Date End Date Taking? Authorizing Provider  apixaban (ELIQUIS) 5 MG TABS tablet Take 1 tablet (5 mg total) by mouth 2 (two) times daily. 03/21/17   Angiulli, Mcarthur Rossetti, PA-C  atorvastatin (LIPITOR) 80 MG tablet Take 80 mg by mouth daily.    [provider]  donepezil (ARICEPT) 5 MG tablet Take 1 tablet (5 mg total) by mouth at bedtime. 03/21/17   Angiulli, Mcarthur Rossetti, PA-C  irbesartan (AVAPRO) 150 MG tablet Take 1 tablet (150 mg total) by mouth daily. 11/06/21   Levin Erp, MD  metoprolol succinate (TOPROL-XL) 50 MG 24 hr tablet Take 1 tablet (50 mg total) by mouth daily. Take with or immediately following a meal. 11/05/21   Levin Erp, MD    Physical Exam: Vitals:   06/22/22  8119 06/22/22 1130 06/22/22 1320  BP: (!) 154/102 (!) 150/96 (!) 150/116  Pulse: 95 100 96  Resp: 18 19 17   Temp: 97.6 F (36.4 C)    TempSrc: Oral    SpO2: 100% 100% 99%  Weight: 35.6 kg     General: Not in acute distress HEENT:       Eyes: PERRL, EOMI, no scleral icterus.       ENT: No discharge from the ears and nose, no pharynx injection, no tonsillar enlargement.        Neck: No JVD, no bruit, no mass felt. Heme: No neck lymph node enlargement. Cardiac: S1/S2, RRR, No murmurs, No gallops or rubs. Respiratory: No rales, wheezing, rhonchi or rubs. GI: Soft, nondistended, nontender, no rebound pain, no organomegaly, BS present. GU: No hematuria Ext: No pitting leg edema bilaterally. 1+DP/PT pulse bilaterally. Musculoskeletal: No joint deformities, No joint redness or warmth, no limitation of ROM in spin. Skin: No rashes.  Neuro: Alert, has difficulty speaking and right facial droop, seems to be oriented x 3,  Cranial nerves grossly intact oterwise, moves all extremities.  Has minimal weakness in the right arm, has normal muscle strength in other extremities. Psych: Patient is not psychotic, no suicidal or hemocidal ideation.  Labs on Admission: I have personally reviewed following labs and imaging studies  CBC: Recent Labs  Lab 06/22/22 0917  WBC 5.7  NEUTROABS 2.7  HGB 14.0  HCT 43.5  MCV 94.4  PLT 217   Basic Metabolic Panel: Recent Labs  Lab 06/22/22 0917  NA 136  K 4.8  CL 104  CO2 25  GLUCOSE 84  BUN 12  CREATININE 0.95  CALCIUM 9.1   GFR: CrCl cannot be calculated (Unknown ideal weight.). Liver Function Tests: Recent Labs  Lab 06/22/22 0917  AST 27  ALT 13  ALKPHOS 73  BILITOT 1.2  PROT 7.3  ALBUMIN 3.5   No results for input(s): "LIPASE", "AMYLASE" in the last 168 hours. No results for input(s): "AMMONIA" in the last 168 hours. Coagulation Profile: Recent Labs  Lab 06/22/22 0917  INR 1.1   Cardiac Enzymes: No results for input(s): "CKTOTAL", "CKMB", "CKMBINDEX", "TROPONINI" in the last 168 hours. BNP (last 3 results) No results for input(s): "PROBNP" in the last 8760 hours. HbA1C: No results for input(s): "HGBA1C" in the last 72 hours. CBG: Recent Labs  Lab 06/22/22 0917 06/22/22 1253  GLUCAP 63* 60*   Lipid Profile: No results for input(s): "CHOL", "HDL", "LDLCALC", "TRIG", "CHOLHDL", "LDLDIRECT" in the last 72 hours. Thyroid Function Tests: No results for input(s): "TSH", "T4TOTAL", "FREET4", "T3FREE", "THYROIDAB" in the last 72 hours. Anemia Panel: No results for input(s): "VITAMINB12", "FOLATE", "FERRITIN", "TIBC", "IRON", "RETICCTPCT" in the last 72 hours. Urine analysis:    Component Value Date/Time   COLORURINE STRAW (A) 06/22/2022 1100   APPEARANCEUR CLEAR (A) 06/22/2022 1100   LABSPEC 1.043 (H) 06/22/2022 1100   PHURINE 7.0 06/22/2022 1100   GLUCOSEU NEGATIVE 06/22/2022 1100   HGBUR SMALL (A) 06/22/2022 1100   BILIRUBINUR  NEGATIVE 06/22/2022 1100   KETONESUR NEGATIVE 06/22/2022 1100   PROTEINUR NEGATIVE 06/22/2022 1100   UROBILINOGEN 4.0 (H) 08/16/2012 0205   NITRITE NEGATIVE 06/22/2022 1100   LEUKOCYTESUR NEGATIVE 06/22/2022 1100   Sepsis Labs: @LABRCNTIP (procalcitonin:4,lacticidven:4) )No results found for this or any previous visit (from the past 240 hour(s)).   Radiological Exams on Admission: MR BRAIN WO CONTRAST  Result Date: 06/22/2022 CLINICAL DATA:  Neuro deficit, acute, stroke suspected. EXAM: MRI HEAD WITHOUT CONTRAST  MRA HEAD WITHOUT CONTRAST TECHNIQUE: Multiplanar, multi-echo pulse sequences of the brain and surrounding structures were acquired without intravenous contrast. Angiographic images of the Circle of Willis were acquired using MRA technique without intravenous contrast. COMPARISON:  Code stroke CTA/CTP 06/22/2022. FINDINGS: MRI HEAD FINDINGS Brain: Acute infarct in the medial aspect of the left prefrontal gyrus (image 40 series 5, coronal image 22 series 7). No acute hemorrhage or significant mass effect. Unchanged old infarcts in the left MCA and PCA territories. No hydrocephalus or extra-axial collection. No mass or midline shift. Vascular: Loss of the left ICA flow void, compatible with known occlusion. Skull and upper cervical spine: Normal marrow signal. Sinuses/Orbits: Mild mucosal disease in the left sphenoid sinus. Orbits are unremarkable. Other: None. MRA HEAD FINDINGS Anterior circulation: Redemonstrated occlusion of the left ICA with reconstitution at the level of the communicating segment. The intracranial portions of the right ICA are patent. Diminished flow related enhancement within left MCA branches is likely secondary to slow, collateral flow. No proximal occlusion of the left M1 or M2 branches. The right MCA is patent proximally with occlusion of an M3 branch of the right superior M2 division in the right sylvian fissure (image 136 series 9). The ACAs are patent proximally  without stenosis or aneurysm. Paucity of distal branches of the left MCA. Posterior circulation: Normal basilar artery. The SCAs, AICAs and PICAs are patent proximally. Moderate stenosis of the right PCA superior P3 division. The left PCA is patent proximally without stenosis or aneurysm. Distal branches are symmetric. Anatomic variants: None. IMPRESSION: 1. Acute infarct in the medial aspect of the left prefrontal gyrus. 2. Redemonstrated occlusion of the left ICA with reconstitution at the level of the communicating segment. 3. Occlusion of an M3 branch of the right superior M2 division in the right Sylvian fissure. 4. Moderate stenosis of the right PCA superior P3 division. These results were called by telephone at the time of interpretation on 06/22/2022 at 10:35 am to provider MCNEILL [facility] , who verbally acknowledged these results. Electronically Signed   By: Orvan Falconer M.D.   On: 06/22/2022 10:57   MR ANGIO HEAD WO CONTRAST  Result Date: 06/22/2022 CLINICAL DATA:  Neuro deficit, acute, stroke suspected. EXAM: MRI HEAD WITHOUT CONTRAST MRA HEAD WITHOUT CONTRAST TECHNIQUE: Multiplanar, multi-echo pulse sequences of the brain and surrounding structures were acquired without intravenous contrast. Angiographic images of the Circle of Willis were acquired using MRA technique without intravenous contrast. COMPARISON:  Code stroke CTA/CTP 06/22/2022. FINDINGS: MRI HEAD FINDINGS Brain: Acute infarct in the medial aspect of the left prefrontal gyrus (image 40 series 5, coronal image 22 series 7). No acute hemorrhage or significant mass effect. Unchanged old infarcts in the left MCA and PCA territories. No hydrocephalus or extra-axial collection. No mass or midline shift. Vascular: Loss of the left ICA flow void, compatible with known occlusion. Skull and upper cervical spine: Normal marrow signal. Sinuses/Orbits: Mild mucosal disease in the left sphenoid sinus. Orbits are unremarkable. Other: None. MRA  HEAD FINDINGS Anterior circulation: Redemonstrated occlusion of the left ICA with reconstitution at the level of the communicating segment. The intracranial portions of the right ICA are patent. Diminished flow related enhancement within left MCA branches is likely secondary to slow, collateral flow. No proximal occlusion of the left M1 or M2 branches. The right MCA is patent proximally with occlusion of an M3 branch of the right superior M2 division in the right sylvian fissure (image 136 series 9). The ACAs are patent proximally without  stenosis or aneurysm. Paucity of distal branches of the left MCA. Posterior circulation: Normal basilar artery. The SCAs, AICAs and PICAs are patent proximally. Moderate stenosis of the right PCA superior P3 division. The left PCA is patent proximally without stenosis or aneurysm. Distal branches are symmetric. Anatomic variants: None. IMPRESSION: 1. Acute infarct in the medial aspect of the left prefrontal gyrus. 2. Redemonstrated occlusion of the left ICA with reconstitution at the level of the communicating segment. 3. Occlusion of an M3 branch of the right superior M2 division in the right Sylvian fissure. 4. Moderate stenosis of the right PCA superior P3 division. These results were called by telephone at the time of interpretation on 06/22/2022 at 10:35 am to provider MCNEILL [facility] , who verbally acknowledged these results. Electronically Signed   By: Orvan Falconer M.D.   On: 06/22/2022 10:57   CT ANGIO HEAD NECK W WO CM W PERF (CODE STROKE)  Result Date: 06/22/2022 CLINICAL DATA:  Neuro deficit, acute, stroke suspected. EXAM: CT ANGIOGRAPHY HEAD AND NECK CT PERFUSION BRAIN TECHNIQUE: Multidetector CT imaging of the head and neck was performed using the standard protocol during bolus administration of intravenous contrast. Multiplanar CT image reconstructions and MIPs were obtained to evaluate the vascular anatomy. Carotid stenosis measurements (when applicable)  are obtained utilizing NASCET criteria, using the distal internal carotid diameter as the denominator. Multiphase CT imaging of the brain was performed following IV bolus contrast injection. Subsequent parametric perfusion maps were calculated using RAPID software. RADIATION DOSE REDUCTION: This exam was performed according to the departmental dose-optimization program which includes automated exposure control, adjustment of the mA and/or kV according to patient size and/or use of iterative reconstruction technique. CONTRAST:  80mL OMNIPAQUE IOHEXOL 350 MG/ML SOLN COMPARISON:  CTA head/neck 03/11/2017. FINDINGS: CTA NECK FINDINGS Aortic arch: Two-vessel arch configuration with common origin of the right brachiocephalic and left common carotid arteries. Atherosclerotic calcifications of the aortic arch and arch vessel origins. Arch vessel origins are patent. Right carotid system: Densely calcified plaque results moderate stenosis of the distal right common carotid artery. Densely calcified plaque results approximately 75% stenosis of the proximal right cervical ICA. Left carotid system: Occlusion of the left cervical ICA at its origin (image 179 series 6). 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 Densely calcified plaque results severe stenosis of the left vertebral artery at its origin, worse from prior. Skeleton: Multilevel cervical spondylosis, worst at C4-5, where there is at least mild spinal canal stenosis. Other neck: Unremarkable. Upper chest: Centrilobular emphysema in the upper lungs. Review of the MIP images confirms the above findings CTA HEAD FINDINGS Anterior circulation: Partial reconstitution of the left ICA, communicating segment by collaterals. Poor contrast opacification of the anterior circulation, likely due to proximal stenosis and occlusion. Possible occlusion of a right MCA M2/3 branch (image 117 series 6). Paucity of distal branches in the  left MCA territory. Posterior circulation: Normal basilar artery. The SCAs, AICAs and PICAs are patent proximally. The PCAs are patent proximally without stenosis or aneurysm. Distal branches are symmetric. Venous sinuses: As permitted by contrast timing, patent. Anatomic variants: None. Review of the MIP images confirms the above findings CT Brain Perfusion Findings: ASPECTS: 10 CBF (<30%) Volume: 4 mL Perfusion (Tmax>6.0s) volume: 66mL Mismatch Volume: 62mL Infarction Location: Left MCA territory. Some ischemic penumbra in the anterior right MCA territory. IMPRESSION: 1. Occlusion of the left cervical ICA at its origin. Partial reconstitution of the left ICA communicating segment by collaterals.  Paucity of distal branches in the left MCA territory. 2. Possible occlusion of a right MCA M2/3 branch. 3. Infarct core of 4 mL in the left MCA territory with 66 mL of surrounding ischemic penumbra, including some ischemic penumbra in the anterior right MCA territory. 4. Densely calcified plaque results in approximately 75% stenosis of the proximal right cervical ICA. 5. 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 6. 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 Aortic Atherosclerosis (ICD10-I70.0) and Emphysema (ICD10-J43.9). Critical Value/emergent results were called by telephone at the time of interpretation on 06/22/2022 at 9:47 am to provider MCNEILL [facility] , who verbally acknowledged these results. Electronically Signed   By: Orvan Falconer M.D.   On: 06/22/2022 10:26   CT HEAD CODE STROKE WO CONTRAST  Result Date: 06/22/2022 CLINICAL DATA:  Code stroke.  Slurred speech.  Right-sided weakness. EXAM: CT HEAD WITHOUT CONTRAST TECHNIQUE: Contiguous axial images were obtained from the base of the skull through the vertex without intravenous contrast. RADIATION DOSE REDUCTION: This exam was performed according to the departmental dose-optimization  program which includes automated exposure control, adjustment of the mA and/or kV according to patient size and/or use of iterative reconstruction technique. COMPARISON:  Head CT 03/10/2017.  MRI brain 03/11/2017. FINDINGS: Brain: No acute hemorrhage. Old infarcts in the left MCA and PCA territories. Old lacunar infarct in the left thalamus. No new loss of gray-white differentiation. No hydrocephalus or extra-axial collection. No mass effect or midline shift. Vascular: No hyperdense vessel or unexpected calcification. Skull: No calvarial fracture or suspicious bone lesion. Skull base is unremarkable. Sinuses/Orbits: Unremarkable. Other: None. ASPECTS [facility] Stroke Program Early CT Score) - Ganglionic level infarction (caudate, lentiform nuclei, internal capsule, insula, M1-M3 cortex): 7 - Supraganglionic infarction (M4-M6 cortex): 3 Total score (0-10 with 10 being normal): 10, accounting for old infarcts in the left MCA territory. IMPRESSION: No acute hemorrhage or evidence of evolving large vessel territory infarct. ASPECT score is 10, accounting for old infarcts in the left MCA and PCA territories. Code stroke imaging results were communicated on 06/22/2022 at 9:37 am to provider Dr. Amada Jupiter via secure text paging. Electronically Signed   By: Orvan Falconer M.D.   On: 06/22/2022 09:38      Assessment/Plan Principal Problem:   Stroke [facility]) Active Problems:   Essential hypertension   Hypoglycemia   PAF (paroxysmal atrial fibrillation) (HCC)   History of memory loss   HLD (hyperlipidemia)   CAD (coronary artery disease)   Tobacco abuse   Protein-calorie malnutrition, severe (HCC)   Assessment and Plan:  Stroke [facility]): MRI showed acute infarct in the medial aspect of the left prefrontal gyrus. CTA of head and neck and MRA of brain showed left carotid occlusion as well as right M2 occlusion. Consulted Dr. Amada Jupiter of neurology.  Since patient symptoms have been present for almost 3 days, he  dose not favor acute aggressive intervention at this time.    - Admit to tele bed as inpatient - pt is out of window for permissive hypertension - continue Eliquis per Dr. Amada Jupiter - Statin: Lipitor - fasting lipid panel and HbA1c  - 2D transthoracic echocardiography  - swallowing screen. If fails, will get SLP - PT/OT consult  Essential hypertension: -IV hydralazine as needed -Irbesartan, metoprolol  Hypoglycemia: Blood sugar 63 -D5-1/2 normal saline at 75 cc/h -As needed D50 -Check CBG every 4 hours  PAF (paroxysmal atrial fibrillation) (HCC): Heart rate 95 -Toprol and Eliquis  History of memory loss -Donepezil  HLD (hyperlipidemia) -Lipitor  CAD (coronary artery disease) -Lipitor  Tobacco abuse -Nicotine patch  Protein-calorie malnutrition, severe (HCC): Body weight 35.6 kg, BMI 15.33 -Nutrition consult    DVT ppx: on Eliquis  Code Status: Full code  Family Communication: I have tried to call her sister and significant other without success, could not leave message since box is not set up.  Disposition Plan:  Anticipate discharge back to previous environment  Consults called:  Dr. Amada Jupiter of neurology is consulted.  Admission status and Level of care: Telemetry Medical:   as inpt       Dispo: The [facility]              Anticipated d/c is to: Home              Anticipated d/c date is: 2 days              Patient currently is not medically stable to d/c.    Severity of Illness:  The appropriate patient status for this patient is INPATIENT. Inpatient status is judged to be reasonable and necessary in order to provide the required intensity of service to ensure the patient's safety. The patient's presenting symptoms, physical exam findings, and initial radiographic and laboratory data in the context of their chronic comorbidities is felt to place them at high risk for further clinical deterioration. Furthermore, it is not  anticipated that the patient will be medically stable for discharge from the hospital within 2 midnights of admission.   * I certify that at the point of admission it is my clinical judgment that the patient will require inpatient hospital care spanning beyond 2 midnights from the point of admission due to high intensity of service, high risk for further deterioration and high frequency of surveillance required.*       Date of Service 06/22/2022    Lorretta Harp [facility]   If 7PM-7AM, please contact night-coverage [email or web address] 06/22/2022, 1:44 PM

## 2022-06-22 NOTE — Progress Notes (Signed)
  Chaplain On-Call responded to Code Stroke notification at 0903 hours.  The patient was being transported into the ED by the EMS squad.  Dr. Amada Jupiter and Stroke Team provided neurological assessment in the hallway, and the patient was moved to the CT Scan area for tests.  Chaplain assured ED Staff of availability as needed.  Chaplain Evelena Peat M.Div., [facility]

## 2022-06-22 NOTE — Evaluation (Signed)
Clinical/Bedside Swallow Evaluation [name]: [id number] Date of Birth: [birth year]  Today's Date: 06/22/2022 Time: SLP Start Time (ACUTE ONLY): 1325 SLP Stop Time (ACUTE ONLY): 1335 SLP Time Calculation (min) (ACUTE ONLY): 10 min  Past Medical History:  Past Medical History:  Diagnosis Date   Acute ischemic left MCA stroke (HCC) 03/11/2017   Hyperlipidemia LDL goal < 70 07/24/2012   Hypertension    S/P CABG x 5 : LIMA-LAD, SVG-OM1-OM2, SVG-(Y)-DIAG-PDA. 07/25/12 07/28/2012   POSTOPERATIVE DIAGNOSIS: Severe 3-vessel coronary disease, status post  myocardial infarction.  PROCEDURE: Median sternotomy, extracorporeal circulation, coronary  artery bypass grafting x5 (left internal mammary artery to LAD,  sequential saphenous vein graft to obtuse marginal 1, obtuse marginal 2,  and posterior descending with Y graft to 1st diagonal), endoscopic vein  harvest, left thigh.     Seizures (HCC)    "years ago" (03/11/2017)   STEMI (ST elevation myocardial infarction), secondary to disease of RCA, with disease of LAD and diag. branch and chronic non dominant LCX occlusion for CABG  07/21/2012   Stroke [facility]) ?08/2012   "right sided weakness; speech isssues since" (03/11/2017)   Tobacco abuse 07/21/2012   Past Surgical History:  Past Surgical History:  Procedure Laterality Date   CARDIAC CATHETERIZATION     CESAREAN SECTION  X 1   CORONARY ARTERY BYPASS GRAFT N/A 07/25/2012   Procedure: CORONARY ARTERY BYPASS GRAFTING (CABG) times five with Left  Endoscopic Saphenous Vein Harvest and Left Internal  Mammary ;  Surgeon: Loreli Slot, MD;  Location: [facility] OR;  Service: Open Heart Surgery;  Laterality: N/A;   DILATION AND CURETTAGE OF UTERUS     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   LEFT HEART CATHETERIZATION WITH CORONARY ANGIOGRAM N/A 07/21/2012   Procedure: LEFT HEART CATHETERIZATION WITH CORONARY ANGIOGRAM;  Surgeon: [name], MD;  Location: [facility] CATH LAB;   Service: Cardiovascular;  Laterality: N/A;   LOOP RECORDER IMPLANT  08-15-2012   Medtronic LinQ implanted by Dr Johney Frame for cryptogenic stroke   TEE WITHOUT CARDIOVERSION N/A 08/15/2012   Procedure: TRANSESOPHAGEAL ECHOCARDIOGRAM (TEE);  Surgeon: Wendall Stade, MD;  Location: [facility] ENDOSCOPY;  Service: Cardiovascular;  Laterality: N/A;   TUBAL LIGATION     HPI:  [name] [name] is a 72 y.o. female with a history of afib and previous stroke, most recent in 2019, who presents with difficulty speaking.  The patient states that she has had noticeably more difficulty over the past 3 days, with symptoms starting on Saturday.  This morning she stood up, and her significant other noticed that she was not acting quite right while standing and therefore called 911 and she was brought in as a code stroke.  The patient is adamant that she has had the symptoms since Saturday, and they have been steady since that time.        She was taken for an emergent CT/CTA which did demonstrate multiple new occlusion since her previous scan, including a right distal M2/M3 junction as well as a left ICA occlusion.  These were both open in 2019.    Assessment / Plan / Recommendation  Clinical Impression  Consult received d/t failed [facility] Swallow Screen at bedside. Nurse reports that pt began coughing when consuming liquids via straw. Pt also has history of dysphagia related to previous CVA (2019).  Pt with baseline right facial weakness that appears unchanged at this time. During this evaluation, when consuming thin liquids via cup,  pt placed cup to her left, able tseal lips around cup on her left and was free of any overt s/s of aspiration when using cup. At baseline, pt reports that she doesn't use straws, therefore straws were not assessed. When consuming puree and saltine crackers, pt demonstrated effective oral phase with complete oral clearing. When discussing diet textures and constraint of using only her left hand for  self-feeding, pt in agreement with a dysphagia 2 diet with thin liquids via cup (NO STRAW), medicine whole with thin liquids for ease of self-feeding. No further services indicated for dysphagia or diet management. SLP Visit Diagnosis: Dysphagia, oropharyngeal phase (R13.12)    Aspiration Risk  Mild aspiration risk    Diet Recommendation Dysphagia 2 (Fine chop);Thin liquid   Liquid Administration via: Cup;No straw Medication Administration: Whole meds with liquid Supervision: Patient able to self feed;Intermittent supervision to cue for compensatory strategies Compensations: Minimize environmental distractions;Slow rate;Small sips/bites Postural Changes: Seated upright at 90 degrees;Remain upright for at least 30 minutes after po intake    Other  Recommendations Oral Care Recommendations: Oral care BID    Recommendations for follow up therapy are one component of a multi-disciplinary discharge planning process, led by the attending physician.  Recommendations may be updated based on patient status, additional functional criteria and insurance authorization.  Follow up Recommendations Follow physician's recommendations for discharge plan and follow up therapies      Assistance Recommended at Discharge  N/A  Functional Status Assessment Patient has had a recent decline in their functional status and/or demonstrates limited ability to make significant improvements in function in a reasonable and predictable amount of time  Frequency and Duration     N/A       Prognosis   N/A     Swallow Study   General Date of Onset: 06/22/22 HPI: [name] [name] is a 72 y.o. female with a history of afib and previous stroke, most recent in 2019, who presents with difficulty speaking.  The patient states that she has had noticeably more difficulty over the past 3 days, with symptoms starting on Saturday.  This morning she stood up, and her significant other noticed that she was not acting quite right  while standing and therefore called 911 and she was brought in as a code stroke.  The patient is adamant that she has had the symptoms since Saturday, and they have been steady since that time.      She was taken for an emergent CT/CTA which did demonstrate multiple new occlusion since her previous scan, including a right distal M2/M3 junction as well as a left ICA occlusion.  These were both open in 2019. Type of Study: Bedside Swallow Evaluation Previous Swallow Assessment: BSE 2019 Diet Prior to this Study: NPO (d/t failed [employer] screen) Temperature Spikes Noted: No Respiratory Status: Room air History of Recent Intubation: No Behavior/Cognition: Alert;Cooperative;Pleasant mood Oral Cavity Assessment: Within Functional Limits Oral Care Completed by SLP: No Oral Cavity - Dentition: Poor condition;Missing dentition Vision: Functional for self-feeding Self-Feeding Abilities: Needs set up Patient Positioning: Upright in bed Baseline Vocal Quality: Normal Volitional Cough: Strong Volitional Swallow: Able to elicit    Oral/Motor/Sensory Function Overall Oral Motor/Sensory Function: Moderate impairment (baseline right sided facial weakness)   Ice Chips Ice chips: Not tested   Thin Liquid Thin Liquid: Within functional limits Presentation: Cup;Self Fed    Nectar Thick Nectar Thick Liquid: Not tested   Honey Thick Honey Thick Liquid: Not tested   Puree Puree:  Within functional limits Presentation: Spoon   Solid     Solid: Within functional limits Presentation: Self Fed     [name] [name], M.S., CCC-SLP, Tree surgeon Certified Brain Injury Specialist [facility]  [facility] Services Office [phone] Ascom [phone] Fax [phone]

## 2022-06-22 NOTE — Code Documentation (Signed)
Stroke Response Nurse Documentation Code Documentation  [name] [name] is a 72 y.o. female arriving to [facility] via [facility] on 06/22/2022 with past medical hx of HTN, HLD, STEMI s/p CABG x5, tobacco use, seizures, L MCA stroke in 2019 with residual right sided weakness and speech issues, loop recorder implant in 2014, cardiac cath. On Eliquis (apixaban) daily. Code stroke was activated by EMS.   Patient from [facility] [place] where she was LKW between Sat 5/18 and now complaining of worsening speech difficulties. Per EMS patient symptoms were discovered at 0830 by family when she was making coffee and noticed that she was not "processing words as quickly" EMS was called. Upon talking with patient, patient reports possible having more difficulty speaking at least x2 days.   Stroke team at the bedside on patient arrival. Labs drawn and patient cleared for CT by Dr. Derrill Kay. Patient to CT with team. NIHSS 9, see documentation for details and code stroke times. Patient with disoriented, left facial droop, left limb ataxia, Expressive aphasia , dysarthria , and Sensory  neglect on exam. The following imaging was completed:  CT Head, CTA, and MRI. Patient is not a candidate for IV Thrombolytic due to outside window per MD. Patient is not a candidate for IR due to outside window per MD .   Care Plan: q2h NIHSS and vital signs.   Bedside handoff with ED RN Herbert Seta.    Wille Glaser  Stroke Response RN

## 2022-06-22 NOTE — ED Triage Notes (Signed)
Pt arrived from home. Pt has slurred speech. [place] [facility] Pt did have previous stroke with right deficit. Pt LKW was 0830am. Pt having slurred speech

## 2022-06-22 NOTE — Consult Note (Signed)
Neurology Consultation Reason for Consult: Worsened aphasia Referring Physician: Virginia Crews  CC: [place]  History is obtained from: patient, EMS  HPI: [name] [name] is a 72 y.o. female with a history of afib and previous stroke, most recent in 2019, who presents with difficulty speaking.  The patient states that she has had noticeably more difficulty over the past 3 days, with symptoms starting on Saturday.  This morning she stood up, and her significant other noticed that she was not acting quite right while standing and therefore called 911 and she was brought in as a code stroke.  The patient is adamant that she has had the symptoms since Saturday, and they have been steady since that time.   She was taken for an emergent CT/CTA which did demonstrate multiple new occlusion since her previous scan, including a right distal M2/M3 junction as well as a left ICA occlusion.  These were both open in 2019.   LKW: Saturday tnk given?: no, outside of window Past Medical History:  Diagnosis Date   Acute ischemic left MCA stroke (HCC) 03/11/2017   Hyperlipidemia LDL goal < 70 07/24/2012   Hypertension    S/P CABG x 5 : LIMA-LAD, SVG-OM1-OM2, SVG-(Y)-DIAG-PDA. 07/25/12 07/28/2012   POSTOPERATIVE DIAGNOSIS: Severe 3-vessel coronary disease, status post  myocardial infarction.  PROCEDURE: Median sternotomy, extracorporeal circulation, coronary  artery bypass grafting x5 (left internal mammary artery to LAD,  sequential saphenous vein graft to obtuse marginal 1, obtuse marginal 2,  and posterior descending with Y graft to 1st diagonal), endoscopic vein  harvest, left thigh.     Seizures (HCC)    "years ago" (03/11/2017)   STEMI (ST elevation myocardial infarction), secondary to disease of RCA, with disease of LAD and diag. branch and chronic non dominant LCX occlusion for CABG  07/21/2012   Stroke [facility]) ?08/2012   "right sided weakness; speech isssues since" (03/11/2017)   Tobacco abuse 07/21/2012      Family History  Problem Relation Age of Onset   Hypertension Mother    Stroke Brother        Died at age 33 secondary to MI   CAD Brother      Social History:  reports that she has been smoking cigarettes. She started smoking about 9 years ago. She has a 51.00 pack-year smoking history. She has never used smokeless tobacco. She reports that she does not drink alcohol and does not use drugs.   Exam: Current vital signs: There were no vitals taken for this visit. Vital signs in last 24 hours:     Physical Exam  Appears well-developed and well-nourished.   Neuro: Mental Status: Patient is awake, alert, she is unable to tell me the month or her age.  She is able to name some simple objects, she is able to reliably answer questions and appears to understand what is said, she follows commands readily, but has significant expressive aphasia.  She is able to reliably answer yes/no questions with   yes and no  Cranial Nerves: II: She has a right hemianopia. Pupils are equal, round, and reactive to light.   III,IV, VI: EOMI without ptosis or diploplia.  V: Facial sensation is symmetric to temperature VII: Facial movement with right facial weakness Motor: She has no drift, but she does have a chronic right hemiparesis. Sensory: She endorses symmetric sensation, but at times does not realize she is being touched on the right suggesting some degree of sensory neglect. Cerebellar: She is mildly ataxic bilaterally  I have reviewed labs in epic and the results pertinent to this consultation are: Creatinine 0.95  I have reviewed the images obtained: CT/CTA-multifocal occlusive disease. 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  Given her symptoms of all been present for at least 3 days and as far as I can tell her right M2 occlusion is completely asymptomatic, I would not favor acute  aggressive intervention at this time.  I suspect that when she stood up this morning, she hypoperfused her left hemisphere given her carotid occlusion due to orthostasis and that resulted in the worsening that her boyfriend reports.  I do think it might be worthwhile to have vascular surgery comment on whether an angiogram to confirm complete occlusion would be helpful.  She is anticoagulated, and given the small size of her stroke and duration of symptoms, as well as significant vascular disease, I do think continuing this would be reasonable from a risk/benefit standpoint.  Recommendations: - HgbA1c, fasting lipid panel - MRI of the brain without contrast - Frequent neuro checks - Echocardiogram -Apixaban 5 mg twice daily - CTA head and neck - Risk factor modification - Telemetry monitoring - PT consult, OT consult, Speech consult - Stroke team to follow   Ritta Slot, MD [facility] [phone]  If 7pm- 7am, please page neurology on call as listed in AMION.

## 2022-06-22 NOTE — Evaluation (Signed)
Speech Language Pathology Evaluation Patient Details Name: [name] [name] MRN: [id number] DOB: 1950-06-12 Today's Date: 06/22/2022 Time: 0865-7846 SLP Time Calculation (min) (ACUTE ONLY): 10 min  Problem List:  Patient Active Problem List   Diagnosis Date Noted   Stroke (HCC) 06/22/2022   HLD (hyperlipidemia) 06/22/2022   Protein-calorie malnutrition, severe (HCC) 06/22/2022   CAD (coronary artery disease) 06/22/2022   Hypoglycemia 06/22/2022   Heart failure with mildly reduced ejection fraction (HFmrEF) (HCC) 11/05/2021   COVID-19 11/04/2021   Dehydration 11/04/2021   Syncope 11/04/2021   Atrial fibrillation with RVR (HCC) 11/03/2021   Chronic anticoagulation 04/24/2019   Closed displaced fracture of proximal phalanx of right little finger    Dyslipidemia, goal LDL below 70    History of stroke    Hypokalemia    History of ST elevation myocardial infarction (STEMI)    History of seizure    History of memory loss    PAF (paroxysmal atrial fibrillation) (HCC) 10/19/2013   Constipation 10/04/2013   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 07/25/12 07/28/2012   Essential hypertension 07/21/2012   Tobacco abuse 07/21/2012   Past Medical History:  Past Medical History:  Diagnosis Date   Acute ischemic left MCA stroke (HCC) 03/11/2017   Hyperlipidemia LDL goal < 70 07/24/2012   Hypertension    S/P CABG x 5 : LIMA-LAD, SVG-OM1-OM2, SVG-(Y)-DIAG-PDA. 07/25/12 07/28/2012   POSTOPERATIVE DIAGNOSIS: Severe 3-vessel coronary disease, status post  myocardial infarction.  PROCEDURE: Median sternotomy, extracorporeal circulation, coronary  artery bypass grafting x5 (left internal mammary artery to LAD,  sequential saphenous vein graft to obtuse marginal 1, obtuse marginal 2,  and posterior descending with Y graft to 1st diagonal),  endoscopic vein  harvest, left thigh.     Seizures (HCC)    "years ago" (03/11/2017)   STEMI (ST elevation myocardial infarction), secondary to disease of RCA, with disease of LAD and diag. branch and chronic non dominant LCX occlusion for CABG  07/21/2012   Stroke [facility]) ?08/2012   "right sided weakness; speech isssues since" (03/11/2017)   Tobacco abuse 07/21/2012   Past Surgical History:  Past Surgical History:  Procedure Laterality Date   CARDIAC CATHETERIZATION     CESAREAN SECTION  X 1   CORONARY ARTERY BYPASS GRAFT N/A 07/25/2012   Procedure: CORONARY ARTERY BYPASS GRAFTING (CABG) times five with Left  Endoscopic Saphenous Vein Harvest and Left Internal  Mammary ;  Surgeon: Loreli Slot, MD;  Location: [facility] OR;  Service: Open Heart Surgery;  Laterality: N/A;   DILATION AND CURETTAGE OF UTERUS     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   LEFT HEART CATHETERIZATION WITH CORONARY ANGIOGRAM N/A 07/21/2012   Procedure: LEFT HEART CATHETERIZATION WITH CORONARY ANGIOGRAM;  Surgeon: [name], MD;  Location: [facility] CATH LAB;  Service: Cardiovascular;  Laterality: N/A;   LOOP RECORDER IMPLANT  08-15-2012   Medtronic LinQ implanted by Dr Johney Frame for cryptogenic stroke   TEE WITHOUT CARDIOVERSION N/A 08/15/2012   Procedure: TRANSESOPHAGEAL ECHOCARDIOGRAM (TEE);  Surgeon: Wendall Stade, MD;  Location: [facility] ENDOSCOPY;  Service: Cardiovascular;  Laterality: N/A;   TUBAL LIGATION     HPI:  [name] [name] is a 72 y.o. female with a history of afib and previous stroke, most recent in 2019, who presents with difficulty speaking.  The patient states that she has had noticeably  more difficulty over the past 3 days, with symptoms starting on Saturday.  This morning she stood up, and her significant other noticed that she was not acting quite right while standing and therefore called 911 and she was brought in as a code stroke.  The patient is adamant that she has had the symptoms since Saturday, and they  have been steady since that time.      She was taken for an emergent CT/CTA which did demonstrate multiple new occlusion since her previous scan, including a right distal M2/M3 junction as well as a left ICA occlusion.  These were both open in 2019.   Assessment / Plan / Recommendation Clinical Impression  Pt presents with what is likely mild acute on chronic expressive communication deficits. Pt's verbal communication is c/b intermittent prolonged perseverative phone repetitions followed by fluent sentence level utterances. When provided with response time, pt is able to demosntrate appropriate word finding for basic orientation questions as well as basic object naming. At this time, recommend St services follow x 1 session to ensure improvement of function and discharge planning.    SLP Assessment  SLP Recommendation/Assessment: Patient needs continued Speech Lanaguage Pathology Services SLP Visit Diagnosis: Aphasia (R47.01);Apraxia (R48.2)    Recommendations for follow up therapy are one component of a multi-disciplinary discharge planning process, led by the attending physician.  Recommendations may be updated based on patient status, additional functional criteria and insurance authorization.    Follow Up Recommendations  Follow physician's recommendations for discharge plan and follow up therapies    Assistance Recommended at Discharge  Frequent or constant Supervision/Assistance  Functional Status Assessment Patient has had a recent decline in their functional status and/or demonstrates limited ability to make significant improvements in function in a reasonable and predictable amount of time  Frequency and Duration min 2x/week  2 weeks      SLP Evaluation Cognition  Overall Cognitive Status: History of cognitive impairments - at baseline       Comprehension  Auditory Comprehension Overall Auditory Comprehension: Appears within functional limits for tasks assessed Visual  Recognition/Discrimination Discrimination: Within Function Limits Reading Comprehension Reading Status: Not tested    Expression Expression Primary Mode of Expression: Verbal Verbal Expression Overall Verbal Expression: Impaired at baseline Written Expression Dominant Hand: Right (using left for self-feeding d/t right hand contracture) Written Expression: Not tested   Oral / Motor  Oral Motor/Sensory Function Overall Oral Motor/Sensory Function: Moderate impairment (baseline right facial weakness, appears unchanged) Motor Speech Overall Motor Speech: Impaired at baseline           Mahki Spikes B. Dreama Saa, M.S., CCC-SLP, Tree surgeon Certified Brain Injury Specialist [facility]  [facility] Rehabilitation Services Office [phone] Ascom [phone] Fax [phone]

## 2022-06-22 NOTE — ED Notes (Signed)
Pt taken to CT 1 at this time.  

## 2022-06-22 NOTE — ED Notes (Signed)
Neuro MD at bedside assessing pt on EMS stretcher. Stroke coordinator at bedside. RN Herbert Seta with pt attempting IV

## 2022-06-23 ENCOUNTER — Inpatient Hospital Stay: Payer: 59

## 2022-06-23 ENCOUNTER — Inpatient Hospital Stay
Admit: 2022-06-23 | Discharge: 2022-06-23 | Disposition: A | Discharge status: Home / Self Care | Payer: 59 | Attending: Internal Medicine | Admitting: Internal Medicine

## 2022-06-23 ENCOUNTER — Inpatient Hospital Stay (HOSPITAL_COMMUNITY)
Admit: 2022-06-23 | Discharge: 2022-06-23 | Disposition: A | Discharge status: Home / Self Care | Payer: 59 | Attending: Internal Medicine | Admitting: Internal Medicine

## 2022-06-23 DIAGNOSIS — I639 Cerebral infarction, unspecified: Secondary | ICD-10-CM | POA: Diagnosis not present

## 2022-06-23 DIAGNOSIS — I6389 Other cerebral infarction: Secondary | ICD-10-CM

## 2022-06-23 LAB — BASIC METABOLIC PANEL
Anion gap: 8 (ref 5–15)
BUN: 22 mg/dL (ref 8–23)
CO2: 24 mmol/L (ref 22–32)
Calcium: 8.6 mg/dL — ABNORMAL LOW (ref 8.9–10.3)
Chloride: 105 mmol/L (ref 98–111)
Creatinine, Ser: 0.83 mg/dL (ref 0.44–1.00)
GFR, Estimated: >60 mL/min (ref >60)
Glucose, Bld: 110 mg/dL — ABNORMAL HIGH (ref 70–99)
Potassium: 4 mmol/L (ref 3.5–5.1)
Sodium: 137 mmol/L (ref 135–145)

## 2022-06-23 LAB — ECHOCARDIOGRAM COMPLETE
S' Lateral: 2.5 cm
Weight: 1255.74 oz

## 2022-06-23 LAB — TSH: TSH: 0.414 u[IU]/mL (ref 0.350–4.500)

## 2022-06-23 LAB — GLUCOSE, CAPILLARY
Glucose-Capillary: 112 mg/dL — ABNORMAL HIGH (ref 70–99)
Glucose-Capillary: 124 mg/dL — ABNORMAL HIGH (ref 70–99)
Glucose-Capillary: 85 mg/dL (ref 70–99)
Glucose-Capillary: 96 mg/dL (ref 70–99)

## 2022-06-23 LAB — VITAMIN B12: Vitamin B-12: 635 pg/mL (ref 180–914)

## 2022-06-23 LAB — LIPID PANEL
Cholesterol: 125 mg/dL (ref 0–200)
HDL: 40 mg/dL — ABNORMAL LOW (ref >40)
LDL Cholesterol: 77 mg/dL (ref 0–99)
Total CHOL/HDL Ratio: 3.1 RATIO
Triglycerides: 40 mg/dL (ref <150)
VLDL: 8 mg/dL (ref 0–40)

## 2022-06-23 LAB — VITAMIN D 25 HYDROXY (VIT D DEFICIENCY, FRACTURES): Vit D, 25-Hydroxy: 18.56 ng/mL — ABNORMAL LOW (ref 30–100)

## 2022-06-23 LAB — T4, FREE: Free T4: 0.96 ng/dL (ref 0.61–1.12)

## 2022-06-23 MED ORDER — ENSURE ENLIVE PO LIQD
237.0000 mL | Freq: Three times a day (TID) | ORAL | Status: DC
  Administered 2022-06-23 – 2022-06-24 (×3): 237 mL via ORAL

## 2022-06-23 MED ORDER — ADULT MULTIVITAMIN W/MINERALS CH
1.0000 | ORAL_TABLET | Freq: Every day | ORAL | Status: DC
  Administered 2022-06-23 – 2022-06-24 (×2): 1 via ORAL
  Filled 2022-06-23 (×2): qty 1

## 2022-06-23 MED ORDER — VITAMIN D (ERGOCALCIFEROL) 1.25 MG (50000 UNIT) PO CAPS
50000.0000 [IU] | ORAL_CAPSULE | ORAL | Status: DC
  Administered 2022-06-23: 50000 [IU] via ORAL
  Filled 2022-06-23: qty 1

## 2022-06-23 MED ORDER — METOPROLOL SUCCINATE ER 50 MG PO TB24: 50.0000 mg | ORAL_TABLET | Freq: Every day | ORAL | Status: DC

## 2022-06-23 MED ORDER — EZETIMIBE 10 MG PO TABS
10.0000 mg | ORAL_TABLET | Freq: Every day | ORAL | Status: DC
  Administered 2022-06-24: 10 mg via ORAL
  Filled 2022-06-23: qty 1

## 2022-06-23 NOTE — Progress Notes (Signed)
[facility] Progress Note  Patient: [name] [name]    JWJ:[id number]  DOA: 06/22/2022     Date of Service: the patient was seen and examined on 06/23/2022  Chief Complaint  Patient presents with   Stroke Symptoms   Brief hospital course: [name] [name] is a 72 y.o. 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 , loop recorder implant in 2014, cardiac cath, A fib on Eliquis, memory loss, who presents with difficulty speaking form past 2 to 3 days.  Last known normal between 5/18 to 5/19. ED workup: CT head negative. MRI of the brain showed acute stroke. Patient is admitted to telemetry bed as inpatient. Dr. Amada Jupiter of neurology is consulted.    MRI and MRA: 1. Acute infarct in the medial aspect of the left prefrontal gyrus. 2. Redemonstrated occlusion of the left ICA with reconstitution at the level of the communicating segment. 3. Occlusion of an M3 branch of the right superior M2 division in the right Sylvian fissure. 4. Moderate stenosis of the right PCA superior P3 division.    CTA of head and neck 1. Occlusion of the left cervical ICA at its origin. Partial reconstitution of the left ICA communicating segment by collaterals. Paucity of distal branches in the left MCA territory. 2. Possible occlusion of a right MCA M2/3 branch. 3. Infarct core of 4 mL in the left MCA territory with 66 mL of surrounding ischemic penumbra, including some ischemic penumbra in the anterior right MCA territory. 4. Densely calcified plaque results in approximately 75% stenosis of the proximal right cervical ICA. 5. 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 6. 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 Aortic Atherosclerosis (ICD10-I70.0) and Emphysema (ICD10-J43.9).  Assessment and Plan:  Acute  CVA MRI and MRI as above Continue monitor on telemetry, continue neurochecks Continue permissive hypertension Continue Eliquis 5 mg p.o. twice daily Continued Lipitor 80 mg p.o. daily, added Zetia 10 mg p.o. 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 2. Mild (1-49%) stenosis proximal right internal carotid artery secondary to irregular echogenic/calcified atherosclerotic plaque. 3. Vertebral arteries are patent with antegrade flow. SLP eval done, recommended dysphagia 2 diet with thin liquids Follow TTE Neuro consult appreciated Follow  PT and OT eval, may need acute rehab State for discharge planning   HTN, CAD s/p CABG, HLD, A-fib  Continued Eliquis as above, continue Lipitor, added Zetia Held metoprolol and irbesartan to allow permissive hypertension due to acute stroke Monitor BP and titrate medications accordingly   Vitamin D deficiency: started vitamin D 50,000 units p.o. weekly, follow with PCP to repeat vitamin D level after 3 to 6 months.  Severe protein calorie malnutrition due to chronic illness Body mass index is 15.33 kg/m.  Interventions: Nutritionist consulted, started protein supplement.        Diet: Dysphagia 2 diet DVT Prophylaxis: Therapeutic Anticoagulation with Eliquis    Advance goals of care discussion: Full code  Family Communication: family was not present at bedside, at the time of interview.  The pt provided permission to discuss medical plan with the family. Opportunity was given to ask question and all questions were answered satisfactorily.   Disposition:  Pt is from Home, admitted with fall and Acute CVA,  still has right side weakness, which precludes a safe discharge. Discharge to AIR, when bed will be available, TOC following for placement.  Subjective: No significant events overnight, patient is having some problem with the speech, AAO x 2,  still has weakness on the right side, denied any other complaints, no chest pain or palpitation, no shortness of breath.  Physical Exam: General: NAD, lying comfortably Appear in no distress, affect appropriate Eyes: PERRLA ENT: Oral Mucosa Clear, moist  Neck: no JVD,  Cardiovascular: S1 and S2 Present, no Murmur,  Respiratory: good respiratory effort, Bilateral Air entry equal and Decreased, no Crackles, no wheezes Abdomen: Bowel Sound present, Soft and no tenderness,  Skin: no rashes Extremities: no Pedal edema, no calf tenderness Neurologic: Right-sided weakness, intermittent expressive aphasia. Gait not checked due to patient safety concerns  Vitals:   06/22/22 1947 06/23/22 0049 06/23/22 0400 06/23/22 0900  BP: (!) 133/91 124/68 138/88 134/88  Pulse: 78 81 84 81  Resp: 18 16 16 18   Temp: 98.2 F (36.8 C) 98.8 F (37.1 C) 98 F (36.7 C) 98.1 F (36.7 C)  TempSrc:      SpO2: 100% 100% 100% 100%  Weight:        Intake/Output Summary (Last 24 hours) at 06/23/2022 1415 Last data filed at 06/23/2022 1011 Gross per 24 hour  Intake 277.02 ml  Output --  Net 277.02 ml   Filed Weights   06/22/22 0941  Weight: 35.6 kg    Data Reviewed: I have personally reviewed and interpreted daily labs, tele strips, imagings as discussed above. I reviewed all nursing notes, pharmacy notes, vitals, pertinent old records I have discussed plan of care as described above with RN and patient/family.  CBC: Recent Labs  Lab 06/22/22 0917  WBC 5.7  NEUTROABS 2.7  HGB 14.0  HCT 43.5  MCV 94.4  PLT 217   Basic Metabolic Panel: Recent Labs  Lab 06/22/22 0917 06/23/22 0402  NA 136 137  K 4.8 4.0  CL 104 105  CO2 25 24  GLUCOSE 84 110*  BUN 12 22  CREATININE 0.95 0.83  CALCIUM 9.1 8.6*    Studies: No results found.  Scheduled Meds:  apixaban  5 mg Oral BID   atorvastatin  80 mg Oral Daily   donepezil  5 mg Oral QHS   [START ON 06/24/2022] ezetimibe  10 mg Oral Daily    [START ON 06/25/2022] metoprolol succinate  50 mg Oral Daily   nicotine  21 mg Transdermal Daily   Continuous Infusions: PRN Meds: acetaminophen **OR** acetaminophen (TYLENOL) oral liquid 160 mg/5 mL **OR** acetaminophen, dextrose, hydrALAZINE, ondansetron (ZOFRAN) IV, senna-docusate  Time spent: 50 minutes  Author: Gillis Santa. MD [facility] 06/23/2022 2:15 PM  To reach On-call, see care teams to locate the attending and reach out to them via [email or web address]. If 7PM-7AM, please contact night-coverage If you still have difficulty reaching the attending provider, please page the [facility] (Director on Call) for [facility] on amion for assistance.

## 2022-06-23 NOTE — Plan of Care (Signed)
  Problem: Education: Goal: Knowledge of disease or condition will improve Outcome: Progressing   Problem: Ischemic Stroke/TIA Tissue Perfusion: Goal: Complications of ischemic stroke/TIA will be minimized Outcome: Progressing   Problem: Coping: Goal: Will identify appropriate support needs Outcome: Progressing   Problem: Health Behavior/Discharge Planning: Goal: Ability to manage health-related needs will improve Outcome: Progressing

## 2022-06-23 NOTE — Progress Notes (Signed)
Speech Language Pathology Treatment: Cognitive-Linquistic  Patient Details Name: JANARIA SANABIA MRN: [id number] DOB: [birth year] Today's Date: 06/23/2022 Time: 4098-1191 SLP Time Calculation (min) (ACUTE ONLY): 15 min  Assessment / Plan / Recommendation Clinical Impression  Pt's expressive communication abilities appear stable at this time. She is reliant on her boyfriend for most communication such as calling 911 for her yesterday.  Given the chronic state of pt's communication difficulties, she may benefit from increased supervision at discharge.    HPI HPI: YANETTE WIESNER is a 72 y.o. female with a history of afib and previous stroke, most recent in 2019, who presents with difficulty speaking.  The patient states that she has had noticeably more difficulty over the past 3 days, with symptoms starting on Saturday.  This morning she stood up, and her significant other noticed that she was not acting quite right while standing and therefore called 911 and she was brought in as a code stroke.  The patient is adamant that she has had the symptoms since Saturday, and they have been steady since that time.      She was taken for an emergent CT/CTA which did demonstrate multiple new occlusion since her previous scan, including a right distal M2/M3 junction as well as a left ICA occlusion.  These were both open in 2019.      SLP Plan  All goals met      Recommendations for follow up therapy are one component of a multi-disciplinary discharge planning process, led by the attending physician.  Recommendations may be updated based on patient status, additional functional criteria and insurance authorization.                         Frequent or constant Supervision/Assistance Aphasia (R47.01);Apraxia (R48.2)     All goals met   Corliss Lamartina B. Dreama Saa, M.S., CCC-SLP, Tree surgeon Certified Brain Injury Specialist [facility]  [facility] Rehabilitation Services Office [phone] Ascom [phone] Fax [phone]

## 2022-06-23 NOTE — Progress Notes (Signed)
Subjective: No significant changes  Exam: Vitals:   06/23/22 0400 06/23/22 0900  BP: 138/88 134/88  Pulse: 84 81  Resp: 16 18  Temp: 98 F (36.7 C) 98.1 F (36.7 C)  SpO2: 100% 100%   Gen: In bed, NAD Resp: non-labored breathing, no acute distress Abd: soft, nt  Neuro: MS: Awake, alert, significant expressive aphasia CN: Right hemianopia, right facial weakness Motor: She has a spastic right hemiparesis, though is able to hold her arms without drift Sensory: Diminished on the right   Pertinent Labs: LDL 77 (on atorvastatin 80 mg nightly prior to arrival) A1c 5.3  Impression: 72 year old female with a history of atrial fibrillation on anticoagulation who presents with multiple days of difficulty speaking with transient worsening in the morning of 5/21 in the setting of recently standing up.  I suspect that she occluded her carotid on Saturday with subsequent infarct that is seen on MRI, and had transient relative left hypoperfusion in the setting of orthostasis when she stood up to make coffee yesterday morning.  I suspect that the carotid is completely occluded based on the CTA appearance, but getting a second modality to confirm will be prudent as if there were trickle flow, could consider angiogram and revascularization.   Recommendations: 1) carotid ultrasound 2) she needs more aggressive lipid therapy, already on high-dose statin, consider adding Zetia 3) continue anticoagulation with apixaban (already multiple days out from a very small stroke) 4) PT, OT, ST 5) neurology will follow-up carotid ultrasound, if this confirms occlusion, no further interventions other than continuing lipid control and anticoagulation   Ritta Slot, MD [facility] [phone]  If 7pm- 7am, please page neurology on call as listed in AMION.

## 2022-06-23 NOTE — Plan of Care (Signed)
  Problem: Education: Goal: Knowledge of disease or condition will improve Outcome: Progressing   Problem: Ischemic Stroke/TIA Tissue Perfusion: Goal: Complications of ischemic stroke/TIA will be minimized Outcome: Progressing   Problem: Coping: Goal: Will verbalize positive feelings about self Outcome: Progressing   Problem: Self-Care: Goal: Ability to participate in self-care as condition permits will improve Outcome: Progressing   Problem: Nutrition: Goal: Risk of aspiration will decrease Outcome: Progressing   Problem: Education: Goal: Knowledge of General Education information will improve Description: Including pain rating scale, medication(s)/side effects and non-pharmacologic comfort measures Outcome: Progressing   Problem: Coping: Goal: Level of anxiety will decrease Outcome: Progressing   Problem: Skin Integrity: Goal: Risk for impaired skin integrity will decrease Outcome: Progressing

## 2022-06-23 NOTE — Evaluation (Signed)
Physical Therapy Evaluation Patient Details Name: [name] [name] MRN: [id number] DOB: Mar 01, 1950 Today's Date: 06/23/2022  History of Present Illness  72 y.o. female with a history of afib and previous stroke, most recent in 2019, who presents with difficulty speaking.  The patient states that she has had noticeably more difficulty over the past 3 days, with symptoms starting on Saturday.  This morning she stood up, and her significant other noticed that she was not acting quite right while standing and therefore called 911 and she was brought in as a code stroke.  The patient is adamant that she has had the symptoms since Saturday, and they have been steady since that time.  Clinical Impression  Patient admitted with the above. PTA, patient lives with boyfriend in a hotel and reports independence with no AD. Patient presents with impaired balance, weakness, and decreased activity tolerance. Ambulated 180' with HHAx1 and min guard. No LOB noted throughout. Patient will benefit from skilled PT services during acute stay to address listed deficits. Patient will benefit from ongoing therapy at discharge to maximize functional independence and safety.        Recommendations for follow up therapy are one component of a multi-disciplinary discharge planning process, led by the attending physician.  Recommendations may be updated based on patient status, additional functional criteria and insurance authorization.  Follow Up Recommendations       Assistance Recommended at Discharge Intermittent Supervision/Assistance  Patient can return home with the following       Equipment Recommendations None recommended by PT  Recommendations for Other Services       Functional Status Assessment Patient has had a recent decline in their functional status and demonstrates the ability to make significant improvements in function in a reasonable and predictable amount of time.     Precautions / Restrictions  Precautions Precautions: Fall Restrictions Weight Bearing Restrictions: No      Mobility  Bed Mobility Overal bed mobility: Needs Assistance Bed Mobility: Supine to Sit     Supine to sit: Supervision          Transfers Overall transfer level: Needs assistance Equipment used: None Transfers: Sit to/from Stand Sit to Stand: Supervision                Ambulation/Gait Ambulation/Gait assistance: Min guard Gait Distance (Feet): 180 Feet Assistive device: 1 person hand held assist Gait Pattern/deviations: Step-through pattern, Decreased stride length Gait velocity: decreased     General Gait Details: min guard for safety  Stairs            Wheelchair Mobility    Modified Rankin (Stroke Patients Only)       Balance Overall balance assessment: Mild deficits observed, not formally tested                                           Pertinent Vitals/Pain Pain Assessment Pain Assessment: No/denies pain    Home Living Family/patient expects to be discharged to:: Other (Comment)     Type of Home: Other(Comment) (hotel)             Additional Comments: living in a hotel wiht her boyfriend    Prior Function Prior Level of Function : History of Falls (last six months)             Mobility Comments: denies AD use, 1 fall recently just prior  to this admission ADLs Comments: Pt reports mod indep with ADL, indep with med mgt, boyfriend assists with some IADL     Hand Dominance        Extremity/Trunk Assessment   Upper Extremity Assessment Upper Extremity Assessment: Defer to OT evaluation    Lower Extremity Assessment Lower Extremity Assessment: Generalized weakness    Cervical / Trunk Assessment Cervical / Trunk Assessment: Normal  Communication   Communication: Expressive difficulties  Cognition Arousal/Alertness: Awake/alert Behavior During Therapy: WFL for tasks assessed/performed Overall Cognitive Status:  History of cognitive impairments - at baseline                                          General Comments      Exercises     Assessment/Plan    PT Assessment Patient needs continued PT services  PT Problem List Decreased strength;Decreased activity tolerance;Decreased balance;Decreased mobility;Decreased cognition;Decreased safety awareness       PT Treatment Interventions Gait training;DME instruction;Functional mobility training;Therapeutic activities;Therapeutic exercise;Balance training;Patient/family education    PT Goals (Current goals can be found in the Care Plan section)  Acute Rehab PT Goals Patient Stated Goal: to go home PT Goal Formulation: With patient Time For Goal Achievement: 07/07/22 Potential to Achieve Goals: Good    Frequency Min 3X/week     Co-evaluation               AM-PAC PT "6 Clicks" Mobility  Outcome Measure Help needed turning from your back to your side while in a flat bed without using bedrails?: A Little Help needed moving from lying on your back to sitting on the side of a flat bed without using bedrails?: A Little Help needed moving to and from a bed to a chair (including a wheelchair)?: A Little Help needed standing up from a chair using your arms (e.g., wheelchair or bedside chair)?: A Little Help needed to walk in hospital room?: A Little Help needed climbing 3-5 steps with a railing? : A Little 6 Click Score: 18    End of Session Equipment Utilized During Treatment: Gait belt Activity Tolerance: Patient tolerated treatment well Patient left: in bed;Other (comment) (handoff to OT) Nurse Communication: Mobility status PT Visit Diagnosis: Unsteadiness on feet (R26.81);Muscle weakness (generalized) (M62.81)    Time: 1610-9604 PT Time Calculation (min) (ACUTE ONLY): 9 min   Charges:   PT Evaluation $PT Eval Moderate Complexity: 1 Mod          Maylon Peppers, PT, DPT Physical Therapist - [place]   [facility]   Ermal Brzozowski A Candi Profit 06/23/2022, 2:39 PM

## 2022-06-23 NOTE — Progress Notes (Signed)
*  PRELIMINARY RESULTS* Echocardiogram 2D Echocardiogram has been performed.  Carolyne Fiscal 06/23/2022, 2:27 PM

## 2022-06-23 NOTE — Progress Notes (Signed)
Initial Nutrition Assessment  DOCUMENTATION CODES:   Severe malnutrition in context of chronic illness, Underweight  INTERVENTION:   -Ensure Enlive po TID, each supplement provides 350 kcal and 20 grams of protein.  -MVI with minerals daily  NUTRITION DIAGNOSIS:   Severe Malnutrition related to chronic illness (stroke) as evidenced by moderate fat depletion, severe fat depletion, moderate muscle depletion, severe muscle depletion.  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 , loop recorder implant in 2014, cardiac cath, A fib on Eliquis, memory loss, who presents with difficulty speaking  Pt admitted with stroke.   5/21- s/p BSE- advanced to dysphagia 2 diet with thin liquids  Reviewed I/O's: +57 ml x 24 hours   MRI revealed acute infarct in the medial aspect of the lt prefrontal gyrus. CTA of head ans neck and MRA of brained revealed lt carotid occlusion and rt M2 occlusion.   Per neurology notes, plan for carotid ultrasound today.   Spoke with pt, who was sitting up in recliner chair at time of visit. Pt was eager to talk to RD and smiled throughout interview. She has a great appetite and reports eating all of her food since admission (however, documented meal intake 10%). Pt had delayed speech at times, but answered questions appropriately.   Pt confirms that she lives in a hotel with her boyfriend. Her boyfriend does all the cooking and grocery shopping ("you don't want me cooking"). Pt denies difficulty paying for or getting groceries. Per pt, she has a microwave and an instant pot in their room which they use to prepare foods. She  consumes 2 times per day, but unable to recall frequent consumed foods. Pt also consumed Ensure "whenever I can get it".   Reviewed wt hx; no wt loss noted over the past 7 months. Pt endorses progressive wt loss, but unable to provide further details. She is unsure of UBW.   Discussed importance of good meal and supplement intake to promote healing. Pt requesting Ensure supplements, which RD will order.   Medications reviewed.   Labs reviewed.   NUTRITION - FOCUSED PHYSICAL EXAM:  Flowsheet Row Most Recent Value  Orbital Region Moderate depletion  Upper Arm Region Severe depletion  Thoracic and Lumbar Region Moderate depletion  Buccal Region Severe depletion  Temple Region Severe depletion  Clavicle Bone Region Severe depletion  Clavicle and Acromion Bone Region Severe depletion  Scapular Bone Region Severe depletion  Dorsal Hand Severe depletion  Patellar Region Severe depletion  Anterior Thigh Region Severe depletion  Posterior Calf Region Severe depletion  Edema (RD Assessment) None  Hair Reviewed  Eyes Reviewed  Mouth Reviewed  Skin Reviewed  Nails Reviewed       Diet Order:   Diet Order             DIET DYS 2 Room service appropriate? Yes; Fluid consistency: Thin  Diet effective now                   EDUCATION NEEDS:   Education needs have been addressed  Skin:  Skin Assessment: Reviewed RN Assessment  Last BM:  06/23/22 (type 1)  Height:   Ht  Readings from Last 1 Encounters:  11/04/21 5' (1.524 m)    Weight:   Wt Readings from Last 1 Encounters:  06/22/22 35.6 kg    Ideal Body Weight:  45.5 kg  BMI:  Body mass index is 15.33 kg/m.  Estimated Nutritional Needs:   Kcal:  1450-1650  Protein:  70-85 grams  Fluid:  1.5-1.7 L    Levada Schilling, RD, LDN, CDCES Registered Dietitian II Certified Diabetes Care and Education Specialist Please refer to [facility] for RD and/or RD on-call/weekend/after hours pager

## 2022-06-23 NOTE — Progress Notes (Signed)
PT Cancellation Note  Patient Details Name: [name] [name] MRN: [id number] DOB: [birth year]   Cancelled Treatment:    Reason Eval/Treat Not Completed: Patient at procedure or test/unavailable Orders received, chart reviewed. Patient off unit for carotid ultrasound. Will re-attempt at later date/time as patient returns and appropriate.   Maylon Peppers, PT, DPT Physical Therapist - [facility]  [facility]    Darcie Mellone A Alix Stowers 06/23/2022, 10:47 AM

## 2022-06-23 NOTE — Progress Notes (Signed)
Occupational Therapy Treatment Patient Details Name: [name] [name] MRN: [id number] DOB: 1950/11/13 Today's Date: 06/23/2022   History of present illness 72 y.o. female with a history of afib and previous stroke, most recent in 2019, who presents with difficulty speaking.  The patient states that she has had noticeably more difficulty over the past 3 days, with symptoms starting on Saturday.  This morning she stood up, and her significant other noticed that she was not acting quite right while standing and therefore called 911 and she was brought in as a code stroke.  The patient is adamant that she has had the symptoms since Saturday, and they have been steady since that time.   OT comments  Pt received in room with PT, pleasant and agreeable to OT tx. Pt performing self-care and functional task performance incorporating hemiparetic RUE to improve unilateral coordination, increase attention to hemibody side, decrease hypertonia and improve neutral positioning. Pt tolerates OT session well, overall CGA for functional mobility and setup - minA for incorporation of hemi UE during grooming tasks standing at sink (see flowsheet for further details). Pt presents with expressive (potentially receptive) aphasia requiring increased time for processing, benefiting from verbal, visual and tactile cuing throughout OT tx, learned non-use and decreased sensorimotor awareness of RUE. Would additionally benefit from dynamic orthosis to improve neutral positioning of wrist/hand due to digit 5 hypertonia. With skilled cuing and facilitation, pt able to volitionally extend tricep while performing functional reach tasks. Continue to address goals in OT POC for functional gains.    Recommendations for follow up therapy are one component of a multi-disciplinary discharge planning process, led by the attending physician.  Recommendations may be updated based on patient status, additional functional criteria and insurance  authorization.    Assistance Recommended at Discharge Frequent or constant Supervision/Assistance  Patient can return home with the following  A little help with walking and/or transfers;A little help with bathing/dressing/bathroom;Assist for transportation;Assistance with cooking/housework;Help with stairs or ramp for entrance;Direct supervision/assist for medications management   Equipment Recommendations  None recommended by OT    Recommendations for Other Services      Precautions / Restrictions Precautions Precautions: Fall Precaution Comments: R inattention Restrictions Weight Bearing Restrictions: No       Mobility Bed Mobility Overal bed mobility: Needs Assistance Bed Mobility: Sit to Supine     Supine to sit: Supervision     General bed mobility comments: increased time effort, requiring vc to incorporate RUE into task performance    Transfers Overall transfer level: Needs assistance Equipment used: None Transfers: Sit to/from Stand Sit to Stand: Min guard                 Balance Overall balance assessment: Mild deficits observed, not formally tested (L postural lean sitting EOB, occ L lean while standing, self-corrects) Sitting-balance support: Feet supported, No upper extremity supported Sitting balance-Leahy Scale: Good     Standing balance support: During functional activity, No upper extremity supported Standing balance-Leahy Scale: Fair                             ADL either performed or assessed with clinical judgement   ADL Overall ADL's : Needs assistance/impaired     Grooming: Wash/dry hands;Oral care;Minimal assistance;Cueing for sequencing;Standing  Functional mobility during ADLs: Min guard (functional mobility around room with CGA overall) General ADL Comments: Performance of self-care and grooming tasks standing at sink. Overall CGA for balance, able to self-correct righting  reactions towards midline. Performing tasks unilaterally with unaffected LUE until OT provides skilled cuing for bimanual tasks. Able to use hemiparetic hand to hold toothpaste as passive stabilizer with tactile and vcs. Brushes teeth L hand while weightbearing through R hand on sink, cues to maintain WB posture and attention. Attempts to use LUE to turn off sink, OT initiates use of RUE via cuing, and pt able to turn off water.    Extremity/Trunk Assessment Upper Extremity Assessment Upper Extremity Assessment: Generalized weakness;RUE deficits/detail;LUE deficits/detail RUE Deficits / Details: hx hemi, tends to keep RUE in flexed position but is able to straighten her arm ~90% of the way to neutral, some mild flexion remaining in fingers, able to bear weight through RUE during bed mobility, grossly 4-/5 to 4/5 with MMT, sensation intact, decr FMC. 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, difficult to range passively, learned non-use and decreased sensorimotor awareness   Lower Extremity Assessment Lower Extremity Assessment: Generalized weakness   Cervical / Trunk Assessment Cervical / Trunk Assessment: Normal     Cognition Arousal/Alertness: Awake/alert Behavior During Therapy: WFL for tasks assessed/performed Overall Cognitive Status: History of cognitive impairments - at baseline                                 General Comments:  (follows multi-step commands with visual, verbal, and tactile cuing, benefits from increased time for processing)        Exercises Exercises: General Upper Extremity General Exercises - Upper Extremity Shoulder Flexion: Seated, Right, 20 reps (Pt performing seated functional reach tasks, OT provides tactile cuing to avoid compensatory flexor synergy patterns with hemiparetic UE positioned in adduction. Excessive time, maxA cuing and demo for pt to perform 20 reps touching target at midline.) Other Exercises Other Exercises: Standing at sink shoulder flexion  using washcloth for glides towards visual target, pt requires cuing to use RUE instead of unaffected LUE, able to perform 5 reps x 3.            Pertinent Vitals/ Pain       Pain Assessment Pain Assessment: No/denies pain  Home Living Family/patient expects to be discharged to:: Other (Comment)     Type of Home: Other(Comment) (hotel)                           Additional Comments: living in a hotel wiht her boyfriend  Lives With: Significant other        Frequency  Min 2X/week        Progress Toward Goals  OT Goals(current goals can now be found in the care plan section)  Progress towards OT goals: Progressing toward goals  Acute Rehab OT Goals Time For Goal Achievement: 07/06/22 Potential to Achieve Goals: Good  Plan Discharge plan remains appropriate       AM-PAC OT "6 Clicks" Daily Activity     Outcome Measure   Help from another person eating meals?: None Help from another person taking care of personal grooming?: A Little Help from another person toileting, which includes using toliet, bedpan, or urinal?: A Little Help from another person bathing (including washing, rinsing, drying)?: A Little Help from another person to put on and taking off regular upper body  clothing?: A Little Help from another person to put on and taking off regular lower body clothing?: A Little 6 Click Score: 19    End of Session Equipment Utilized During Treatment: Gait belt  OT Visit Diagnosis: Hemiplegia and hemiparesis;Cognitive communication deficit (R41.841);Other symptoms and signs involving the nervous system (R29.898);Muscle weakness (generalized) (M62.81) Hemiplegia - Right/Left: Right Hemiplegia - dominant/non-dominant: Dominant Hemiplegia - caused by: Cerebral infarction   Activity Tolerance Patient tolerated treatment well   Patient Left in bed;with call bell/phone within reach;with bed alarm set   Nurse Communication          Time: 4098-1191 OT  Time Calculation (min): 25 min  Charges: OT General Charges $OT Visit: 1 Visit OT Treatments $Self Care/Home Management : 8-22 mins $Neuromuscular Re-education: 8-22 mins  Breah Joa L. Khairi Garman, OTR/L  06/23/22, 3:24 PM

## 2022-06-23 NOTE — Progress Notes (Signed)
OT Cancellation Note  Patient Details Name: [name] [name] MRN: [id number] DOB: 30-Jun-1950   Cancelled Treatment:     Reason Eval/Treat Not completed: Pt at procedure. Chart reviewed, pt off unit for carotid ultrasound. Will attempt at later date/time as appropriate.   Longino Trefz L. Sherrill Mckamie, OTR/L  06/23/22, 11:21 AM

## 2022-06-24 DIAGNOSIS — I639 Cerebral infarction, unspecified: Secondary | ICD-10-CM | POA: Diagnosis not present

## 2022-06-24 LAB — PHOSPHORUS: Phosphorus: 3.9 mg/dL (ref 2.5–4.6)

## 2022-06-24 LAB — CBC
HCT: 38.5 % (ref 36.0–46.0)
Hemoglobin: 12.8 g/dL (ref 12.0–15.0)
MCH: 30.4 pg (ref 26.0–34.0)
MCHC: 33.2 g/dL (ref 30.0–36.0)
MCV: 91.4 fL (ref 80.0–100.0)
Platelets: 217 10*3/uL (ref 150–400)
RBC: 4.21 MIL/uL (ref 3.87–5.11)
RDW: 14.2 % (ref 11.5–15.5)
WBC: 6.2 10*3/uL (ref 4.0–10.5)
nRBC: 0 % (ref 0.0–0.2)

## 2022-06-24 LAB — BASIC METABOLIC PANEL
Anion gap: 5 (ref 5–15)
BUN: 32 mg/dL — ABNORMAL HIGH (ref 8–23)
CO2: 24 mmol/L (ref 22–32)
Calcium: 9 mg/dL (ref 8.9–10.3)
Chloride: 105 mmol/L (ref 98–111)
Creatinine, Ser: 0.83 mg/dL (ref 0.44–1.00)
GFR, Estimated: >60 mL/min (ref >60)
Glucose, Bld: 102 mg/dL — ABNORMAL HIGH (ref 70–99)
Potassium: 4.4 mmol/L (ref 3.5–5.1)
Sodium: 134 mmol/L — ABNORMAL LOW (ref 135–145)

## 2022-06-24 LAB — GLUCOSE, CAPILLARY
Glucose-Capillary: 110 mg/dL — ABNORMAL HIGH (ref 70–99)
Glucose-Capillary: 91 mg/dL (ref 70–99)
Glucose-Capillary: 96 mg/dL (ref 70–99)

## 2022-06-24 LAB — MAGNESIUM: Magnesium: 2 mg/dL (ref 1.7–2.4)

## 2022-06-24 MED ORDER — VITAMIN D (ERGOCALCIFEROL) 1.25 MG (50000 UNIT) PO CAPS: 50000.0000 [IU] | ORAL_CAPSULE | ORAL | 0 refills | Status: DC

## 2022-06-24 MED ORDER — METOPROLOL SUCCINATE ER 25 MG PO TB24: 25.0000 mg | ORAL_TABLET | Freq: Every day | ORAL | 5 refills | Status: DC

## 2022-06-24 MED ORDER — ADULT MULTIVITAMIN W/MINERALS CH: 1.0000 | ORAL_TABLET | Freq: Every day | ORAL | 0 refills | Status: DC

## 2022-06-24 MED ORDER — ADULT MULTIVITAMIN W/MINERALS CH: 1.0000 | ORAL_TABLET | Freq: Every day | ORAL | 0 refills | Status: AC

## 2022-06-24 MED ORDER — EZETIMIBE 10 MG PO TABS: 10.0000 mg | ORAL_TABLET | Freq: Every day | ORAL | 5 refills | Status: AC

## 2022-06-24 NOTE — TOC Transition Note (Addendum)
Transition of Care [facility]) - CM/SW Discharge Note   [name]: [id number] Date of Birth: [birth year]  [facility] CM/SW Contact:  Margarito Liner, LCSW Phone Number: 06/24/2022, 11:15 AM   Clinical Narrative: Patient has orders to discharge home today. No further concerns. CSW signing off.    2:36 pm: Patient agreeable to outpatient therapy. Prefers [facility]. Faxed referral form. Gave information to patient and sister about [facility]. CSW signing off.  Final next level of care: Home/Self Care Barriers to Discharge: Barriers Resolved   Patient Goals and CMS Choice      Discharge Placement                  Patient to be transferred to facility by: Sister   Patient and family notified of of transfer: 06/24/22  Discharge Plan and Services Additional resources added to the After Visit Summary for       Post Acute Care Choice: NA                               Social Determinants of Health (SDOH) Interventions SDOH Screenings   Tobacco Use: High Risk (11/04/2021)     Readmission Risk Interventions     No data to display

## 2022-06-24 NOTE — Progress Notes (Signed)
Received MD order to discharge patient with home health/PT.  I reviewed discharge instructions, follow up appointments, home meds and prescriptions with patient and sister Richardson Landry and they both verbalized understanding

## 2022-06-24 NOTE — TOC CM/SW Note (Signed)
     [facility] REFERRAL        Occupational Therapy * Physical Therapy * Speech Therapy                           DATE 06/24/2022  PATIENT NAME [name] [name]    PATIENT MRN [id number]       DIAGNOSIS/DIAGNOSIS CODE I63.9, I10, I48.0, I25.10  DATE OF DISCHARGE: 06/24/22       PRIMARY CARE PHYSICIAN    Preston Fleeting, MD        PCP PHONE/FAX Phone: [phone]     Dear Provider (Name: [facility] Fax: [phone]   I certify that I have examined this patient and that occupational/physical/speech therapy is necessary on an outpatient basis.    The patient has expressed interest in completing their recommended course of therapy at your  location.  Once a formal order from the patient's primary care physician has been obtained, please  contact him/her to schedule an appointment for evaluation at your earliest convenience.   [x ]  Physical Therapy Evaluate and Treat  [ x ]  Occupational Therapy Evaluate and Treat  [  ]  Speech Therapy Evaluate and Treat         The patient's primary care physician (listed above) must furnish and be responsible for a formal order such that the recommended services may be furnished while under the primary physician's care, and that the plan of care will be established and reviewed every 30 days (or more often if condition necessitates).

## 2022-06-24 NOTE — Discharge Summary (Signed)
[facility] Discharge Summary   Patient: [name] [name]:[id number]  PCP: Preston Fleeting, MD  Date of admission: 06/22/2022   Date of discharge:  06/24/2022     Discharge Diagnoses:  Principal Problem:   Stroke [facility]) Active Problems:   Essential hypertension   Hypoglycemia   PAF (paroxysmal atrial fibrillation) (HCC)   History of memory loss   HLD (hyperlipidemia)   CAD (coronary artery disease)   Tobacco abuse   Protein-calorie malnutrition, severe (HCC)   Admitted From: Home Disposition:  Home outpatient physical therapy  Recommendations for Outpatient Follow-up:  Follow-up with PCP in 1 week, monitor BP and titrate medications accordingly, blood pressure is soft so decreased Toprol-XL from 50 to 25 mg p.o. daily.  As per neurology keep blood pressure slightly high due to complete occlusion of left carotid.  Discontinued irbesartan and lisinopril.  Follow with PCP to titrate medications accordingly. patient may benefit from CT scan chest for cancer screening. Follow-up with neurology in 2 to 3 weeks Follow up LABS/TEST:     Follow-up Information     Revelo, Presley Raddle, MD Follow up in 1 week(s).   Specialty: Family Medicine Contact information: [address] [place] [address] [phone]                Diet recommendation: Cardiac diet  Activity: The patient is advised to gradually reintroduce usual activities, as tolerated  Discharge Condition: stable  Code Status: Full code   History of present illness: As per the H and P dictated on admission  Hospital Course:  [name] [name] is a 72 y.o. 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 , loop recorder implant in 2014, cardiac cath, A fib on Eliquis, memory loss, who presents with difficulty speaking form past 2 to 3 days.  Last known normal  between 5/18 to 5/19. ED workup: CT head negative. MRI of the brain showed acute stroke. Patient is admitted to telemetry bed as inpatient. Dr. Amada Jupiter of neurology is consulted.  MRI and MRA: 1. Acute infarct in the medial aspect of the left prefrontal gyrus. 2. Redemonstrated occlusion of the left ICA with reconstitution at the level of the communicating segment. 3. Occlusion of an M3 branch of the right superior M2 division in the right Sylvian fissure. 4. Moderate stenosis of the right PCA superior P3 division. CTA of head and neck: 1. Occlusion of the left cervical ICA at its origin. Partial reconstitution of the left ICA communicating segment by collaterals. Paucity of distal branches in the left MCA territory. 2. Possible occlusion of a right MCA M2/3 branch. 3. Infarct core of 4 mL in the left MCA territory with 66 mL of surrounding ischemic penumbra, including some ischemic penumbra in the anterior right MCA territory. 4. Densely calcified plaque results in approximately 75% stenosis of the proximal right cervical ICA. 5. 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 6. 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 Aortic Atherosclerosis (ICD10-I70.0) and Emphysema (ICD10-J43.9).   Assessment and Plan: # Acute CVA, MRI and MRI as above Monitored on telemetry, no significant events.  Neurochecks were done as protocol, no worsening of symptoms noticed. S/p permissive hypertension. Continue Eliquis 5 mg p.o. twice daily, Continued Lipitor 80 mg p.o. daily, added Zetia 10 mg p.o. daily. 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 2. Mild (1-49%) stenosis proximal right internal carotid artery secondary to irregular echogenic/calcified atherosclerotic plaque. 3. Vertebral arteries are patent with antegrade flow. SLP eval done, recommended dysphagia 2 diet  with thin liquids.  TTE shows LVEF 40 to 45%, decreased LV function, global hypokinesis.  RV systolic function mildly reduced.  Moderate TR.  Negative PFO.  Neurology was consulted, recommended continue Eliquis, low slightly elevated BP due to chronic oxygen of left carotid artery.  Follow-up as an outpatient.  PT/OT evaluation done, recommended home health PT but patient was not interested.  So outpatient PT referral was done.   # HTN, CAD s/p CABG, HLD, A-fib: Continued Eliquis as above, continue Lipitor, added Zetia. Held metoprolol and irbesartan to allow permissive hypertension due to acute stroke.  Patient was discharged on Toprol succinate 25 mg p.o. daily and discontinued irbesartan.  Patient was advised to monitor BP at home and follow with PCP to titrate medications accordingly. # Vitamin D deficiency: started vitamin D 50,000 units p.o. weekly, follow with PCP to repeat vitamin D level after 3 to 6 months. # Severe protein calorie malnutrition due to chronic illness Body mass index is 15.33 kg/m.  Interventions: Nutritionist consulted, started protein supplement.   Patient was seen by physical therapy, who recommended Home health, but patient was not interested to patient was referred to outpatient physical therapy.   On the day of the discharge the patient's vitals were stable, and no other acute medical condition were reported by patient. the patient was felt safe to be discharge at Home with outpatient PT referral.  Consultants: Neurologist Procedures: None  Discharge Exam: General: Appear in no distress, no Rash; Oral Mucosa Clear, moist. Cardiovascular: S1 and S2 Present, no Murmur, Respiratory: normal respiratory effort, Bilateral Air entry present and no Crackles, no wheezes Abdomen: Bowel Sound present, Soft and no tenderness, no hernia Extremities: no Pedal edema, no calf tenderness Neurology: alert and oriented to time, place, and person affect appropriate.  Filed Weights    06/22/22 0941  Weight: 35.6 kg   Vitals:   06/24/22 0506 06/24/22 0907  BP: 120/80 113/76  Pulse: 89 61  Resp: 18 17  Temp: 98.1 F (36.7 C) 97.8 F (36.6 C)  SpO2: 100% 100%    DISCHARGE MEDICATION: Allergies as of 06/24/2022   No Known Allergies      Medication List     STOP taking these medications    irbesartan 150 MG tablet Commonly known as: AVAPRO   lisinopril 20 MG tablet Commonly known as: ZESTRIL       TAKE these medications    apixaban 5 MG Tabs tablet Commonly known as: Eliquis Take 1 tablet (5 mg total) by mouth 2 (two) times daily.   atorvastatin 80 MG tablet Commonly known as: LIPITOR Take 80 mg by mouth daily.   donepezil 5 MG tablet Commonly known as: ARICEPT Take 1 tablet (5 mg total) by mouth at bedtime.   ezetimibe 10 MG tablet Commonly known as: ZETIA Take 1 tablet (10 mg total) by mouth daily. Start taking on: Jun 25, 2022   metoprolol succinate 25 MG 24 hr tablet Commonly known as: TOPROL-XL Take 1 tablet (25 mg total) by mouth daily. Take with or immediately following a meal. What changed:  medication strength how much to take   multivitamin with minerals Tabs tablet Take 1 tablet by mouth daily. Start taking  on: Jun 25, 2022   Vitamin D (Ergocalciferol) 1.25 MG (50000 UNIT) Caps capsule Commonly known as: DRISDOL Take 1 capsule (50,000 Units total) by mouth every 7 (seven) days. Start taking on: Jun 30, 2022       No Known Allergies Discharge Instructions     Ambulatory referral to Neurology   Complete by: As directed    An appointment is requested in approximately: 2 weeks   Call MD for:   Complete by: As directed    Worsening of weakness and numbness, any new neurological changes.   Call MD for:  difficulty breathing, headache or visual disturbances   Complete by: As directed    Call MD for:  extreme fatigue   Complete by: As directed    Call MD for:  persistant dizziness or light-headedness   Complete  by: As directed    Call MD for:  severe uncontrolled pain   Complete by: As directed    Diet - low sodium heart healthy   Complete by: As directed    Discharge instructions   Complete by: As directed    Follow-up with PCP in 1 week, monitor BP and titrate medications accordingly, blood pressure is soft so decreased Toprol-XL from 50 to 25 mg p.o. daily.  As per neurology keep blood pressure slightly high due to complete occlusion of left carotid.  Discontinued irbesartan and lisinopril.  Follow with PCP to titrate medications accordingly. patient may benefit from CT scan chest for cancer screening. Follow-up with neurology in 2 to 3 weeks   Increase activity slowly   Complete by: As directed        The results of significant diagnostics from this hospitalization (including imaging, microbiology, ancillary and laboratory) are listed below for reference.    Significant Diagnostic Studies: ECHOCARDIOGRAM COMPLETE  Result Date: 06/23/2022    ECHOCARDIOGRAM REPORT   Patient Name:   RONAN BATAILLE Date of Exam: 06/23/2022 Medical Rec #:  [id number]       Height:       60.0 in Accession #:    [id number]      Weight:       78.5 lb Date of Birth:  12-26-1950       BSA:          1.254 m Patient Age:    72 years        BP:           138/88 mmHg Patient Gender: F               HR:           69 bpm. Exam Location:  [facility] Procedure: 2D Echo, Cardiac Doppler and Color Doppler Indications:     Stroke  History:         Patient has prior history of Echocardiogram examinations, most                  recent 11/04/2021. CHF, CAD and Previous Myocardial Infarction,                  Prior CABG, Stroke, Arrythmias:Atrial Fibrillation,                  Signs/Symptoms:Syncope; Risk Factors:Hypertension, Dyslipidemia                  and Current Smoker.  Sonographer:     Mikki Harbor Referring Phys:  [id number] Brien Few NIU Diagnosing Phys: Yvonne Kendall MD  Sonographer Comments: Technically challenging  study due to limited  acoustic windows and no apical window. Image acquisition challenging due to patient body habitus. IMPRESSIONS  1. Left ventricular ejection fraction, by estimation, is 40 to 45%. The left ventricle has mildly decreased function. The left ventricle demonstrates global hypokinesis. Left ventricular diastolic function could not be evaluated.  2. Right ventricular systolic function is mildly reduced. The right ventricular size is normal. There is normal pulmonary artery systolic pressure.  3. The mitral valve is normal in structure. Mild mitral valve regurgitation.  4. Tricuspid valve regurgitation is moderate.  5. The aortic valve is tricuspid. Aortic valve regurgitation is trivial. Unable to assess aortic valve gradient.  6. The inferior vena cava is normal in size with <50% respiratory variability, suggesting right atrial pressure of 8 mmHg. Comparison(s): A prior study was performed on 11/04/2021. No significant change from prior study. FINDINGS  Left Ventricle: Left ventricular ejection fraction, by estimation, is 40 to 45%. The left ventricle has mildly decreased function. The left ventricle demonstrates global hypokinesis. The left ventricular internal cavity size was normal in size. There is  borderline left ventricular hypertrophy. Left ventricular diastolic function could not be evaluated. Right Ventricle: The right ventricular size is normal. No increase in right ventricular wall thickness. Right ventricular systolic function is mildly reduced. There is normal pulmonary artery systolic pressure. The tricuspid regurgitant velocity is 2.42 m/s, and with an assumed right atrial pressure of 8 mmHg, the estimated right ventricular systolic pressure is 31.4 mmHg. Left Atrium: Left atrial size was not well visualized. Right Atrium: Right atrial size was not well visualized. Pericardium: There is no evidence of pericardial effusion. Mitral Valve: The mitral valve is normal in structure. Mild mitral valve  regurgitation. Tricuspid Valve: The tricuspid valve is normal in structure. Tricuspid valve regurgitation is moderate. Aortic Valve: The aortic valve is tricuspid. Aortic valve regurgitation is trivial. Unable to assess aortic valve gradient. Pulmonic Valve: The pulmonic valve was not well visualized. Pulmonic valve regurgitation is not visualized. No evidence of pulmonic stenosis. Aorta: The aortic root is normal in size and structure. Pulmonary Artery: The pulmonary artery is not well seen. Venous: The inferior vena cava is normal in size with less than 50% respiratory variability, suggesting right atrial pressure of 8 mmHg. IAS/Shunts: No atrial level shunt detected by color flow Doppler.  LEFT VENTRICLE PLAX 2D LVIDd:         3.70 cm LVIDs:         2.50 cm LV PW:         1.00 cm LV IVS:        1.00 cm LVOT diam:     1.90 cm LV SV:         33 LV SV Index:   27 LVOT Area:     2.84 cm  LEFT ATRIUM         Index LA diam:    3.50 cm 2.79 cm/m  AORTIC VALVE LVOT Vmax:   64.70 cm/s LVOT Vmean:  35.700 cm/s LVOT VTI:    0.118 m  AORTA Ao Root diam: 3.00 cm TRICUSPID VALVE TR Peak grad:   23.4 mmHg TR Vmax:        242.00 cm/s  SHUNTS Systemic VTI:  0.12 m Systemic Diam: 1.90 cm Yvonne Kendall MD Electronically signed by Yvonne Kendall MD Signature Date/Time: 06/23/2022/6:01:26 PM    Final    US Carotid Bilateral  Result Date: 06/23/2022 CLINICAL DATA:  Stroke EXAM: BILATERAL CAROTID DUPLEX ULTRASOUND TECHNIQUE: Wallace Cullens scale  imaging, color Doppler and duplex ultrasound were performed of bilateral carotid and vertebral arteries in the neck. COMPARISON:  None Available. FINDINGS: Criteria: Quantification of carotid stenosis is based on velocity parameters that correlate the residual internal carotid diameter with NASCET-based stenosis levels, using the diameter of the distal internal carotid lumen as the denominator for stenosis measurement. The following velocity measurements were obtained: RIGHT ICA: 114/44 cm/sec  CCA: 74/29 cm/sec SYSTOLIC ICA/CCA RATIO:  1.5 ECA:  45 cm/sec LEFT ICA: 33/9 cm/sec CCA: 66/7 cm/sec SYSTOLIC ICA/CCA RATIO:  0.5 ECA:  26 cm/sec RIGHT CAROTID ARTERY: Heterogeneous atherosclerotic plaque in the common and proximal internal carotid artery. The plaque is irregular. By peak systolic velocity criteria, the estimated stenosis is less than 50%. RIGHT VERTEBRAL ARTERY:  Patent with antegrade flow. LEFT CAROTID ARTERY: Echogenic calcified atherosclerotic plaque throughout the common and internal carotid artery. There appears to be chronic occlusion of the internal carotid artery. LEFT VERTEBRAL ARTERY:  Patent with antegrade flow. IMPRESSION: 1. 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 2. Mild (1-49%) stenosis proximal right internal carotid artery secondary to irregular echogenic/calcified atherosclerotic plaque. 3. Vertebral arteries are patent with antegrade flow. Electronically Signed   By: Malachy Moan M.D.   On: 06/23/2022 14:16   MR BRAIN WO CONTRAST  Result Date: 06/22/2022 CLINICAL DATA:  Neuro deficit, acute, stroke suspected. EXAM: MRI HEAD WITHOUT CONTRAST MRA HEAD WITHOUT CONTRAST TECHNIQUE: Multiplanar, multi-echo pulse sequences of the brain and surrounding structures were acquired without intravenous contrast. Angiographic images of the Circle of Willis were acquired using MRA technique without intravenous contrast. COMPARISON:  Code stroke CTA/CTP 06/22/2022. FINDINGS: MRI HEAD FINDINGS Brain: Acute infarct in the medial aspect of the left prefrontal gyrus (image 40 series 5, coronal image 22 series 7). No acute hemorrhage or significant mass effect. Unchanged old infarcts in the left MCA and PCA territories. No hydrocephalus or extra-axial collection. No mass or midline shift. Vascular: Loss of the left ICA flow void, compatible with known occlusion. Skull and upper cervical spine: Normal marrow signal. Sinuses/Orbits: Mild mucosal disease in the left sphenoid  sinus. Orbits are unremarkable. Other: None. MRA HEAD FINDINGS Anterior circulation: Redemonstrated occlusion of the left ICA with reconstitution at the level of the communicating segment. The intracranial portions of the right ICA are patent. Diminished flow related enhancement within left MCA branches is likely secondary to slow, collateral flow. No proximal occlusion of the left M1 or M2 branches. The right MCA is patent proximally with occlusion of an M3 branch of the right superior M2 division in the right sylvian fissure (image 136 series 9). The ACAs are patent proximally without stenosis or aneurysm. Paucity of distal branches of the left MCA. Posterior circulation: Normal basilar artery. The SCAs, AICAs and PICAs are patent proximally. Moderate stenosis of the right PCA superior P3 division. The left PCA is patent proximally without stenosis or aneurysm. Distal branches are symmetric. Anatomic variants: None. IMPRESSION: 1. Acute infarct in the medial aspect of the left prefrontal gyrus. 2. Redemonstrated occlusion of the left ICA with reconstitution at the level of the communicating segment. 3. Occlusion of an M3 branch of the right superior M2 division in the right Sylvian fissure. 4. Moderate stenosis of the right PCA superior P3 division. These results were called by telephone at the time of interpretation on 06/22/2022 at 10:35 am to provider MCNEILL [facility] , who verbally acknowledged these results. Electronically Signed   By: Orvan Falconer M.D.   On: 06/22/2022 10:57  MR ANGIO HEAD WO CONTRAST  Result Date: 06/22/2022 CLINICAL DATA:  Neuro deficit, acute, stroke suspected. EXAM: MRI HEAD WITHOUT CONTRAST MRA HEAD WITHOUT CONTRAST TECHNIQUE: Multiplanar, multi-echo pulse sequences of the brain and surrounding structures were acquired without intravenous contrast. Angiographic images of the Circle of Willis were acquired using MRA technique without intravenous contrast. COMPARISON:  Code  stroke CTA/CTP 06/22/2022. FINDINGS: MRI HEAD FINDINGS Brain: Acute infarct in the medial aspect of the left prefrontal gyrus (image 40 series 5, coronal image 22 series 7). No acute hemorrhage or significant mass effect. Unchanged old infarcts in the left MCA and PCA territories. No hydrocephalus or extra-axial collection. No mass or midline shift. Vascular: Loss of the left ICA flow void, compatible with known occlusion. Skull and upper cervical spine: Normal marrow signal. Sinuses/Orbits: Mild mucosal disease in the left sphenoid sinus. Orbits are unremarkable. Other: None. MRA HEAD FINDINGS Anterior circulation: Redemonstrated occlusion of the left ICA with reconstitution at the level of the communicating segment. The intracranial portions of the right ICA are patent. Diminished flow related enhancement within left MCA branches is likely secondary to slow, collateral flow. No proximal occlusion of the left M1 or M2 branches. The right MCA is patent proximally with occlusion of an M3 branch of the right superior M2 division in the right sylvian fissure (image 136 series 9). The ACAs are patent proximally without stenosis or aneurysm. Paucity of distal branches of the left MCA. Posterior circulation: Normal basilar artery. The SCAs, AICAs and PICAs are patent proximally. Moderate stenosis of the right PCA superior P3 division. The left PCA is patent proximally without stenosis or aneurysm. Distal branches are symmetric. Anatomic variants: None. IMPRESSION: 1. Acute infarct in the medial aspect of the left prefrontal gyrus. 2. Redemonstrated occlusion of the left ICA with reconstitution at the level of the communicating segment. 3. Occlusion of an M3 branch of the right superior M2 division in the right Sylvian fissure. 4. Moderate stenosis of the right PCA superior P3 division. These results were called by telephone at the time of interpretation on 06/22/2022 at 10:35 am to provider MCNEILL [facility] , who  verbally acknowledged these results. Electronically Signed   By: Orvan Falconer M.D.   On: 06/22/2022 10:57   CT ANGIO HEAD NECK W WO CM W PERF (CODE STROKE)  Result Date: 06/22/2022 CLINICAL DATA:  Neuro deficit, acute, stroke suspected. EXAM: CT ANGIOGRAPHY HEAD AND NECK CT PERFUSION BRAIN TECHNIQUE: Multidetector CT imaging of the head and neck was performed using the standard protocol during bolus administration of intravenous contrast. Multiplanar CT image reconstructions and MIPs were obtained to evaluate the vascular anatomy. Carotid stenosis measurements (when applicable) are obtained utilizing NASCET criteria, using the distal internal carotid diameter as the denominator. Multiphase CT imaging of the brain was performed following IV bolus contrast injection. Subsequent parametric perfusion maps were calculated using RAPID software. RADIATION DOSE REDUCTION: This exam was performed according to the departmental dose-optimization program which includes automated exposure control, adjustment of the mA and/or kV according to patient size and/or use of iterative reconstruction technique. CONTRAST:  80mL OMNIPAQUE IOHEXOL 350 MG/ML SOLN COMPARISON:  CTA head/neck 03/11/2017. FINDINGS: CTA NECK FINDINGS Aortic arch: Two-vessel arch configuration with common origin of the right brachiocephalic and left common carotid arteries. Atherosclerotic calcifications of the aortic arch and arch vessel origins. Arch vessel origins are patent. Right carotid system: Densely calcified plaque results moderate stenosis of the distal right common carotid artery. Densely calcified plaque results approximately 75% stenosis of  the proximal right cervical ICA. Left carotid system: Occlusion of the left cervical ICA at its origin (image 179 series 6). 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 Densely calcified plaque results severe stenosis of the left vertebral artery at its  origin, worse from prior. Skeleton: Multilevel cervical spondylosis, worst at C4-5, where there is at least mild spinal canal stenosis. Other neck: Unremarkable. Upper chest: Centrilobular emphysema in the upper lungs. Review of the MIP images confirms the above findings CTA HEAD FINDINGS Anterior circulation: Partial reconstitution of the left ICA, communicating segment by collaterals. Poor contrast opacification of the anterior circulation, likely due to proximal stenosis and occlusion. Possible occlusion of a right MCA M2/3 branch (image 117 series 6). Paucity of distal branches in the left MCA territory. Posterior circulation: Normal basilar artery. The SCAs, AICAs and PICAs are patent proximally. The PCAs are patent proximally without stenosis or aneurysm. Distal branches are symmetric. Venous sinuses: As permitted by contrast timing, patent. Anatomic variants: None. Review of the MIP images confirms the above findings CT Brain Perfusion Findings: ASPECTS: 10 CBF (<30%) Volume: 4 mL Perfusion (Tmax>6.0s) volume: 66mL Mismatch Volume: 62mL Infarction Location: Left MCA territory. Some ischemic penumbra in the anterior right MCA territory. IMPRESSION: 1. Occlusion of the left cervical ICA at its origin. Partial reconstitution of the left ICA communicating segment by collaterals. Paucity of distal branches in the left MCA territory. 2. Possible occlusion of a right MCA M2/3 branch. 3. Infarct core of 4 mL in the left MCA territory with 66 mL of surrounding ischemic penumbra, including some ischemic penumbra in the anterior right MCA territory. 4. Densely calcified plaque results in approximately 75% stenosis of the proximal right cervical ICA. 5. 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 6. 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 Aortic Atherosclerosis (ICD10-I70.0) and Emphysema (ICD10-J43.9). Critical Value/emergent results were called by  telephone at the time of interpretation on 06/22/2022 at 9:47 am to provider MCNEILL [facility] , who verbally acknowledged these results. Electronically Signed   By: Orvan Falconer M.D.   On: 06/22/2022 10:26   CT HEAD CODE STROKE WO CONTRAST  Result Date: 06/22/2022 CLINICAL DATA:  Code stroke.  Slurred speech.  Right-sided weakness. EXAM: CT HEAD WITHOUT CONTRAST TECHNIQUE: Contiguous axial images were obtained from the base of the skull through the vertex without intravenous contrast. RADIATION DOSE REDUCTION: This exam was performed according to the departmental dose-optimization program which includes automated exposure control, adjustment of the mA and/or kV according to patient size and/or use of iterative reconstruction technique. COMPARISON:  Head CT 03/10/2017.  MRI brain 03/11/2017. FINDINGS: Brain: No acute hemorrhage. Old infarcts in the left MCA and PCA territories. Old lacunar infarct in the left thalamus. No new loss of gray-white differentiation. No hydrocephalus or extra-axial collection. No mass effect or midline shift. Vascular: No hyperdense vessel or unexpected calcification. Skull: No calvarial fracture or suspicious bone lesion. Skull base is unremarkable. Sinuses/Orbits: Unremarkable. Other: None. ASPECTS [facility] Stroke Program Early CT Score) - Ganglionic level infarction (caudate, lentiform nuclei, internal capsule, insula, M1-M3 cortex): 7 - Supraganglionic infarction (M4-M6 cortex): 3 Total score (0-10 with 10 being normal): 10, accounting for old infarcts in the left MCA territory. IMPRESSION: No acute hemorrhage or evidence of evolving large vessel territory infarct. ASPECT score is 10, accounting for old infarcts in the left MCA and PCA territories. Code stroke imaging results were communicated on 06/22/2022  at 9:37 am to provider Dr. Amada Jupiter via secure text paging. Electronically Signed   By: Orvan Falconer M.D.   On: 06/22/2022 09:38    Microbiology: No results found  for this or any previous visit (from the past 240 hour(s)).   Labs: CBC: Recent Labs  Lab 06/22/22 0917 06/24/22 0533  WBC 5.7 6.2  NEUTROABS 2.7  --   HGB 14.0 12.8  HCT 43.5 38.5  MCV 94.4 91.4  PLT 217 217   Basic Metabolic Panel: Recent Labs  Lab 06/22/22 0917 06/23/22 0402 06/24/22 0533  NA 136 137 134*  K 4.8 4.0 4.4  CL 104 105 105  CO2 25 24 24   GLUCOSE 84 110* 102*  BUN 12 22 32*  CREATININE 0.95 0.83 0.83  CALCIUM 9.1 8.6* 9.0  MG  --   --  2.0  PHOS  --   --  3.9   Liver Function Tests: Recent Labs  Lab 06/22/22 0917  AST 27  ALT 13  ALKPHOS 73  BILITOT 1.2  PROT 7.3  ALBUMIN 3.5   No results for input(s): "LIPASE", "AMYLASE" in the last 168 hours. No results for input(s): "AMMONIA" in the last 168 hours. Cardiac Enzymes: No results for input(s): "CKTOTAL", "CKMB", "CKMBINDEX", "TROPONINI" in the last 168 hours. BNP (last 3 results) No results for input(s): "BNP" in the last 8760 hours. CBG: Recent Labs  Lab 06/23/22 1248 06/23/22 1550 06/23/22 2138 06/24/22 0011 06/24/22 0727  GLUCAP 85 96 124* 110* 96    Time spent: 35 minutes  Signed:  Gillis Santa  [facility] 06/24/2022 11:39 AM

## 2022-06-24 NOTE — TOC Initial Note (Signed)
Transition of Care [facility]) - Initial/Assessment Note    [name]: [id number] Date of Birth: [birth year]  Transition of Care [facility]) CM/SW Contact:    Margarito Liner, LCSW Phone Number: 06/24/2022, 10:33 AM  Clinical Narrative:  CSW met with patient. No supports at bedside. CSW introduced role and explained that therapy recommendations would be discussed. Patient is not interested in home health at this time but will contact her PCP if she changes her mind. No DME recommendations. No further concerns. CSW encouraged patient to contact CSW as needed. CSW will continue to follow patient for support and facilitate return home once stable. She said her sister will likely be the person to pick her up.                Expected Discharge Plan: Home/Self Care (Motel) Barriers to Discharge: Continued Medical Work up   Patient Goals and CMS Choice            Expected Discharge Plan and Services     Post Acute Care Choice: NA Living arrangements for the past 2 months: Hotel/Motel                                      Prior Living Arrangements/Services Living arrangements for the past 2 months: Hotel/Motel Lives with:: Significant Other Patient language and need for interpreter reviewed:: Yes Do you feel safe going back to the place where you live?: Yes      Need for Family Participation in Patient Care: Yes (Comment) Care giver support system in place?: Yes (comment)   Criminal Activity/Legal Involvement Pertinent to Current Situation/Hospitalization: No - Comment as needed  Activities of Daily Living      Permission Sought/Granted                  Emotional Assessment Appearance:: Appears stated age Attitude/Demeanor/Rapport: Engaged, Gracious Affect (typically observed): Accepting, Appropriate, Calm, Pleasant Orientation: : Oriented to Self, Oriented to Place, Oriented to Situation Alcohol / Substance Use: Not Applicable Psych  Involvement: No (comment)  Admission diagnosis:  Stroke [facility]) [I63.9] Cerebrovascular accident (CVA), unspecified mechanism (HCC) [I63.9] Patient Active Problem List   Diagnosis Date Noted   Stroke (HCC) 06/22/2022   HLD (hyperlipidemia) 06/22/2022   Protein-calorie malnutrition, severe (HCC) 06/22/2022   CAD (coronary artery disease) 06/22/2022   Hypoglycemia 06/22/2022   Heart failure with mildly reduced ejection fraction (HFmrEF) (HCC) 11/05/2021   COVID-19 11/04/2021   Dehydration 11/04/2021   Syncope 11/04/2021   Atrial fibrillation with RVR (HCC) 11/03/2021   Chronic anticoagulation 04/24/2019   Closed displaced fracture of proximal phalanx of right little finger    Dyslipidemia, goal LDL below 70    History of stroke    Hypokalemia    History of ST elevation myocardial infarction (STEMI)    History of seizure    History of memory loss    PAF (paroxysmal atrial fibrillation) (HCC) 10/19/2013   Constipation 10/04/2013   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 07/25/12 07/28/2012   Essential hypertension 07/21/2012   Tobacco abuse 07/21/2012   PCP:  Preston Fleeting, MD Pharmacy:   [facility] [place], [place] - [address] [place] [address] Phone: [phone] Fax: [phone]  [facility] [address] [place] [address] Phone: [phone] Fax: [phone]     Social Determinants of Health (SDOH) Social History: SDOH Screenings   Tobacco Use: High Risk (11/04/2021)   SDOH Interventions:     Readmission Risk Interventions     No data to display

## 2022-07-23 ENCOUNTER — Emergency Department (HOSPITAL_COMMUNITY): Payer: 59

## 2022-07-23 ENCOUNTER — Inpatient Hospital Stay (HOSPITAL_COMMUNITY)
Admission: EM | Admit: 2022-07-23 | Discharge: 2022-08-06 | DRG: 064 | Disposition: A | Discharge status: Skilled Nursing Facility | Payer: 59 | Attending: Neurology | Admitting: Neurology

## 2022-07-23 ENCOUNTER — Encounter (HOSPITAL_COMMUNITY): Payer: Self-pay

## 2022-07-23 ENCOUNTER — Telehealth: Payer: Self-pay

## 2022-07-23 ENCOUNTER — Inpatient Hospital Stay (HOSPITAL_COMMUNITY): Payer: 59

## 2022-07-23 ENCOUNTER — Other Ambulatory Visit: Payer: Self-pay | Admitting: Family Medicine

## 2022-07-23 DIAGNOSIS — I480 Paroxysmal atrial fibrillation: Secondary | ICD-10-CM | POA: Diagnosis present

## 2022-07-23 DIAGNOSIS — R1310 Dysphagia, unspecified: Secondary | ICD-10-CM | POA: Diagnosis present

## 2022-07-23 DIAGNOSIS — G40901 Epilepsy, unspecified, not intractable, with status epilepticus: Secondary | ICD-10-CM

## 2022-07-23 DIAGNOSIS — Z515 Encounter for palliative care: Secondary | ICD-10-CM

## 2022-07-23 DIAGNOSIS — Z7901 Long term (current) use of anticoagulants: Secondary | ICD-10-CM | POA: Diagnosis not present

## 2022-07-23 DIAGNOSIS — Z951 Presence of aortocoronary bypass graft: Secondary | ICD-10-CM

## 2022-07-23 DIAGNOSIS — R4701 Aphasia: Secondary | ICD-10-CM | POA: Diagnosis present

## 2022-07-23 DIAGNOSIS — I5042 Chronic combined systolic (congestive) and diastolic (congestive) heart failure: Secondary | ICD-10-CM | POA: Diagnosis not present

## 2022-07-23 DIAGNOSIS — E785 Hyperlipidemia, unspecified: Secondary | ICD-10-CM | POA: Diagnosis present

## 2022-07-23 DIAGNOSIS — Z681 Body mass index (BMI) 19 or less, adult: Secondary | ICD-10-CM | POA: Diagnosis not present

## 2022-07-23 DIAGNOSIS — L89152 Pressure ulcer of sacral region, stage 2: Secondary | ICD-10-CM | POA: Diagnosis present

## 2022-07-23 DIAGNOSIS — Z8249 Family history of ischemic heart disease and other diseases of the circulatory system: Secondary | ICD-10-CM

## 2022-07-23 DIAGNOSIS — Z7189 Other specified counseling: Secondary | ICD-10-CM | POA: Diagnosis not present

## 2022-07-23 DIAGNOSIS — R638 Other symptoms and signs concerning food and fluid intake: Secondary | ICD-10-CM | POA: Diagnosis not present

## 2022-07-23 DIAGNOSIS — I472 Ventricular tachycardia, unspecified: Secondary | ICD-10-CM | POA: Diagnosis not present

## 2022-07-23 DIAGNOSIS — I610 Nontraumatic intracerebral hemorrhage in hemisphere, subcortical: Secondary | ICD-10-CM | POA: Diagnosis present

## 2022-07-23 DIAGNOSIS — Z1231 Encounter for screening mammogram for malignant neoplasm of breast: Secondary | ICD-10-CM

## 2022-07-23 DIAGNOSIS — E875 Hyperkalemia: Secondary | ICD-10-CM | POA: Diagnosis present

## 2022-07-23 DIAGNOSIS — I110 Hypertensive heart disease with heart failure: Secondary | ICD-10-CM | POA: Diagnosis present

## 2022-07-23 DIAGNOSIS — E46 Unspecified protein-calorie malnutrition: Secondary | ICD-10-CM | POA: Diagnosis not present

## 2022-07-23 DIAGNOSIS — I619 Nontraumatic intracerebral hemorrhage, unspecified: Secondary | ICD-10-CM | POA: Diagnosis not present

## 2022-07-23 DIAGNOSIS — E43 Unspecified severe protein-calorie malnutrition: Secondary | ICD-10-CM | POA: Diagnosis present

## 2022-07-23 DIAGNOSIS — I6389 Other cerebral infarction: Secondary | ICD-10-CM | POA: Diagnosis not present

## 2022-07-23 DIAGNOSIS — I252 Old myocardial infarction: Secondary | ICD-10-CM

## 2022-07-23 DIAGNOSIS — R29724 NIHSS score 24: Secondary | ICD-10-CM | POA: Diagnosis present

## 2022-07-23 DIAGNOSIS — Z79899 Other long term (current) drug therapy: Secondary | ICD-10-CM | POA: Diagnosis not present

## 2022-07-23 DIAGNOSIS — R569 Unspecified convulsions: Secondary | ICD-10-CM | POA: Diagnosis present

## 2022-07-23 DIAGNOSIS — R64 Cachexia: Secondary | ICD-10-CM | POA: Diagnosis present

## 2022-07-23 DIAGNOSIS — L8990 Pressure ulcer of unspecified site, unspecified stage: Secondary | ICD-10-CM | POA: Insufficient documentation

## 2022-07-23 DIAGNOSIS — F0390 Unspecified dementia without behavioral disturbance: Secondary | ICD-10-CM | POA: Diagnosis present

## 2022-07-23 DIAGNOSIS — I69354 Hemiplegia and hemiparesis following cerebral infarction affecting left non-dominant side: Secondary | ICD-10-CM

## 2022-07-23 DIAGNOSIS — I5022 Chronic systolic (congestive) heart failure: Secondary | ICD-10-CM | POA: Diagnosis present

## 2022-07-23 DIAGNOSIS — Z823 Family history of stroke: Secondary | ICD-10-CM

## 2022-07-23 DIAGNOSIS — I6522 Occlusion and stenosis of left carotid artery: Secondary | ICD-10-CM | POA: Diagnosis present

## 2022-07-23 DIAGNOSIS — I63513 Cerebral infarction due to unspecified occlusion or stenosis of bilateral middle cerebral arteries: Principal | ICD-10-CM | POA: Diagnosis present

## 2022-07-23 DIAGNOSIS — I6502 Occlusion and stenosis of left vertebral artery: Secondary | ICD-10-CM | POA: Diagnosis present

## 2022-07-23 DIAGNOSIS — Z91148 Patient's other noncompliance with medication regimen for other reason: Secondary | ICD-10-CM

## 2022-07-23 DIAGNOSIS — R29810 Facial weakness: Secondary | ICD-10-CM | POA: Diagnosis present

## 2022-07-23 DIAGNOSIS — R1312 Dysphagia, oropharyngeal phase: Secondary | ICD-10-CM | POA: Diagnosis not present

## 2022-07-23 DIAGNOSIS — F17210 Nicotine dependence, cigarettes, uncomplicated: Secondary | ICD-10-CM | POA: Diagnosis present

## 2022-07-23 DIAGNOSIS — Z789 Other specified health status: Secondary | ICD-10-CM | POA: Diagnosis not present

## 2022-07-23 DIAGNOSIS — I2510 Atherosclerotic heart disease of native coronary artery without angina pectoris: Secondary | ICD-10-CM | POA: Diagnosis present

## 2022-07-23 DIAGNOSIS — I618 Other nontraumatic intracerebral hemorrhage: Secondary | ICD-10-CM | POA: Diagnosis not present

## 2022-07-23 DIAGNOSIS — I4510 Unspecified right bundle-branch block: Secondary | ICD-10-CM | POA: Diagnosis present

## 2022-07-23 LAB — BASIC METABOLIC PANEL
Anion gap: 12 (ref 5–15)
BUN: 15 mg/dL (ref 8–23)
CO2: 18 mmol/L — ABNORMAL LOW (ref 22–32)
Calcium: 9.2 mg/dL (ref 8.9–10.3)
Chloride: 107 mmol/L (ref 98–111)
Creatinine, Ser: 0.94 mg/dL (ref 0.44–1.00)
GFR, Estimated: >60 mL/min (ref >60)
Glucose, Bld: 88 mg/dL (ref 70–99)
Potassium: 4.5 mmol/L (ref 3.5–5.1)
Sodium: 137 mmol/L (ref 135–145)

## 2022-07-23 LAB — COMPREHENSIVE METABOLIC PANEL
ALT: 21 U/L (ref 0–44)
AST: 36 U/L (ref 15–41)
Albumin: 3.6 g/dL (ref 3.5–5.0)
Alkaline Phosphatase: 74 U/L (ref 38–126)
Anion gap: 10 (ref 5–15)
BUN: 16 mg/dL (ref 8–23)
CO2: 22 mmol/L (ref 22–32)
Calcium: 9.6 mg/dL (ref 8.9–10.3)
Chloride: 108 mmol/L (ref 98–111)
Creatinine, Ser: 1.04 mg/dL — ABNORMAL HIGH (ref 0.44–1.00)
GFR, Estimated: 56 mL/min — ABNORMAL LOW (ref >60)
Glucose, Bld: 97 mg/dL (ref 70–99)
Potassium: 5.2 mmol/L — ABNORMAL HIGH (ref 3.5–5.1)
Sodium: 140 mmol/L (ref 135–145)
Total Bilirubin: 0.7 mg/dL (ref 0.3–1.2)
Total Protein: 7.5 g/dL (ref 6.5–8.1)

## 2022-07-23 LAB — CBC WITH DIFFERENTIAL/PLATELET
Abs Immature Granulocytes: 0.01 10*3/uL (ref 0.00–0.07)
Basophils Absolute: 0.1 10*3/uL (ref 0.0–0.1)
Basophils Relative: 1 %
Eosinophils Absolute: 0 10*3/uL (ref 0.0–0.5)
Eosinophils Relative: 0 %
HCT: 46 % (ref 36.0–46.0)
Hemoglobin: 14.8 g/dL (ref 12.0–15.0)
Immature Granulocytes: 0 %
Lymphocytes Relative: 15 %
Lymphs Abs: 1 10*3/uL (ref 0.7–4.0)
MCH: 30.3 pg (ref 26.0–34.0)
MCHC: 32.2 g/dL (ref 30.0–36.0)
MCV: 94.3 fL (ref 80.0–100.0)
Monocytes Absolute: 0.5 10*3/uL (ref 0.1–1.0)
Monocytes Relative: 7 %
Neutro Abs: 5 10*3/uL (ref 1.7–7.7)
Neutrophils Relative %: 77 %
Platelets: 213 10*3/uL (ref 150–400)
RBC: 4.88 MIL/uL (ref 3.87–5.11)
RDW: 13.8 % (ref 11.5–15.5)
WBC: 6.5 10*3/uL (ref 4.0–10.5)
nRBC: 0 % (ref 0.0–0.2)

## 2022-07-23 LAB — MAGNESIUM: Magnesium: 1.8 mg/dL (ref 1.7–2.4)

## 2022-07-23 LAB — PROTIME-INR
INR: 1.1 (ref 0.8–1.2)
Prothrombin Time: 14.9 seconds (ref 11.4–15.2)

## 2022-07-23 LAB — LACTIC ACID, PLASMA
Lactic Acid, Venous: 2.7 mmol/L (ref 0.5–1.9)
Lactic Acid, Venous: 1.6 mmol/L (ref 0.5–1.9)

## 2022-07-23 LAB — APTT: aPTT: 30 seconds (ref 24–36)

## 2022-07-23 LAB — CBG MONITORING, ED: Glucose-Capillary: 88 mg/dL (ref 70–99)

## 2022-07-23 LAB — ETHANOL: Alcohol, Ethyl (B): <10 mg/dL (ref <10)

## 2022-07-23 LAB — CK: Total CK: 1833 U/L — ABNORMAL HIGH (ref 38–234)

## 2022-07-23 LAB — TROPONIN I (HIGH SENSITIVITY): Troponin I (High Sensitivity): 13 ng/L (ref <18)

## 2022-07-23 MED ORDER — STROKE: EARLY STAGES OF RECOVERY BOOK
Freq: Once | Status: DC
  Filled 2022-07-23: qty 1

## 2022-07-23 MED ORDER — PANTOPRAZOLE SODIUM 40 MG IV SOLR
40.0000 mg | Freq: Every day | INTRAVENOUS | Status: DC
  Administered 2022-07-23 – 2022-08-05 (×14): 40 mg via INTRAVENOUS
  Filled 2022-07-23 (×13): qty 10

## 2022-07-23 MED ORDER — IOHEXOL 350 MG/ML SOLN
75.0000 mL | Freq: Once | INTRAVENOUS | Status: AC | PRN
  Administered 2022-07-23: 75 mL via INTRAVENOUS

## 2022-07-23 MED ORDER — ACETAMINOPHEN 650 MG RE SUPP: 650.0000 mg | RECTAL | Status: DC | PRN

## 2022-07-23 MED ORDER — ACETAMINOPHEN 160 MG/5ML PO SOLN: 650.0000 mg | ORAL | Status: DC | PRN

## 2022-07-23 MED ORDER — ACETAMINOPHEN 325 MG PO TABS
650.0000 mg | ORAL_TABLET | ORAL | Status: DC | PRN
  Administered 2022-07-27: 650 mg via ORAL
  Filled 2022-07-23: qty 2

## 2022-07-23 MED ORDER — LEVETIRACETAM IN NACL 1000 MG/100ML IV SOLN
1000.0000 mg | Freq: Once | INTRAVENOUS | Status: AC
  Administered 2022-07-23: 1000 mg via INTRAVENOUS

## 2022-07-23 MED ORDER — CLEVIDIPINE BUTYRATE 0.5 MG/ML IV EMUL
0.0000 mg/h | INTRAVENOUS | Status: DC
  Administered 2022-07-23: 2 mg/h via INTRAVENOUS
  Administered 2022-07-24: 16 mg/h via INTRAVENOUS
  Filled 2022-07-23: qty 50
  Filled 2022-07-23: qty 100
  Filled 2022-07-23: qty 50

## 2022-07-23 MED ORDER — EMPTY CONTAINERS FLEXIBLE MISC
900.0000 mg | Freq: Once | Status: DC
  Filled 2022-07-23: qty 90

## 2022-07-23 MED ORDER — SENNOSIDES-DOCUSATE SODIUM 8.6-50 MG PO TABS
1.0000 | ORAL_TABLET | Freq: Two times a day (BID) | ORAL | Status: DC
  Administered 2022-07-26 – 2022-08-02 (×13): 1 via ORAL
  Filled 2022-07-23 (×15): qty 1

## 2022-07-23 MED ORDER — ORAL CARE MOUTH RINSE
15.0000 mL | OROMUCOSAL | Status: DC | PRN
  Administered 2022-07-23: 15 mL via OROMUCOSAL

## 2022-07-23 MED ORDER — LEVETIRACETAM IN NACL 500 MG/100ML IV SOLN
500.0000 mg | Freq: Two times a day (BID) | INTRAVENOUS | Status: DC
  Administered 2022-07-23 – 2022-07-26 (×6): 500 mg via INTRAVENOUS
  Filled 2022-07-23 (×6): qty 100

## 2022-07-23 MED ORDER — SODIUM CHLORIDE 0.9 % IV SOLN
2000.0000 mg | Freq: Once | INTRAVENOUS | Status: DC
  Filled 2022-07-23: qty 20

## 2022-07-23 MED ORDER — SODIUM CHLORIDE 0.9 % IV SOLN: INTRAVENOUS | Status: DC

## 2022-07-23 NOTE — H&P (Signed)
Neurology H&P  CC: Seizure and decreased responsiveness  History is obtained from: Family and chart  HPI: [name] [name] is a 72 y.o. female with history of hypertension, hyperlipidemia, STEMI, CABG, tobacco use, seizures, 4 strokes with most recent last month and atrial fibrillation on Eliquis who presents after being found this morning in the fetal position with rhythmic jerking movements of the bilateral upper extremities.  Patient's sister, who is her healthcare power of attorney, states that she is normally ambulatory and able to hold a conversation.  She was looking into assisted living facilities due to decreased ability to do ADLs secondary to recent stroke.  Patient's sister states that patient's boyfriend called her this morning and told her that patient had started to have decreased responsiveness yesterday evening.  Patient's sister came to patient's home and found her curled up in the fetal position, unresponsive to voice with rhythmic shaking movements of bilateral upper extremities and called EMS.  Patient was found to be having a seizure and was given 2.5 mg of IM Versed by EMS.  On arrival, head CT/CTA revealed acute IPH in right basal ganglia with possible IVH as well as known stenosis of multiple intracerebral arteries. Patient's sister states that patient has previously stated she would like to be full code. Patient is taking Eliquis, risks and benefits of reversal were discussed with patient's sister at the bedside, and decision was made not to reverse Eliquis at this time.  Given persistent gaze deviation and diminished responsiveness, patient was placed on long-term EEG.  ICH score 1-2  1 for GCS (which may be more related to seizure)  1 for possible IVH   LKW: 6/20 evening tpa given?: No, ICH IR Thrombectomy? No, ICH Modified Rankin Scale: 3-Moderate disability-requires help but walks WITHOUT assistance  NIHSS:  1a Level of Conscious.: 1 1b LOC Questions: 2 1c LOC  Commands: 2 2 Best Gaze: 2 3 Visual: 0 4 Facial Palsy: 0 5a Motor Arm - left: 3 5b Motor Arm - Right: 3 6a Motor Leg - Left: 3 6b Motor Leg - Right: 3 7 Limb Ataxia: 0 8 Sensory: 0 9 Best Language: 3 10 Dysarthria: 2 11 Extinct and Inattention.: 0 TOTAL: 24  ROS: Unable to obtain due to altered mental status.   Past Medical History:  Diagnosis Date   Acute ischemic left MCA stroke (HCC) 03/11/2017   Hyperlipidemia LDL goal < 70 07/24/2012   Hypertension    S/P CABG x 5 : LIMA-LAD, SVG-OM1-OM2, SVG-(Y)-DIAG-PDA. 07/25/12 07/28/2012   POSTOPERATIVE DIAGNOSIS: Severe 3-vessel coronary disease, status post  myocardial infarction.  PROCEDURE: Median sternotomy, extracorporeal circulation, coronary  artery bypass grafting x5 (left internal mammary artery to LAD,  sequential saphenous vein graft to obtuse marginal 1, obtuse marginal 2,  and posterior descending with Y graft to 1st diagonal), endoscopic vein  harvest, left thigh.     Seizures (HCC)    "years ago" (03/11/2017)   STEMI (ST elevation myocardial infarction), secondary to disease of RCA, with disease of LAD and diag. branch and chronic non dominant LCX occlusion for CABG  07/21/2012   Stroke [facility]) ?08/2012   "right sided weakness; speech isssues since" (03/11/2017)   Tobacco abuse 07/21/2012    Family History  Problem Relation Age of Onset   Hypertension Mother    Stroke Brother        Died at age 75 secondary to MI   CAD Brother      Social History:  reports that she has been  smoking cigarettes. She started smoking about 9 years ago. She has a 51.00 pack-year smoking history. She has never used smokeless tobacco. She reports that she does not drink alcohol and does not use drugs.   Prior to Admission medications   Medication Sig Start Date End Date Taking? Authorizing Provider  apixaban (ELIQUIS) 5 MG TABS tablet Take 1 tablet (5 mg total) by mouth 2 (two) times daily. 03/21/17   Angiulli, Mcarthur Rossetti, PA-C  atorvastatin  (LIPITOR) 80 MG tablet Take 80 mg by mouth daily.    [provider]  donepezil (ARICEPT) 5 MG tablet Take 1 tablet (5 mg total) by mouth at bedtime. 03/21/17   Angiulli, Mcarthur Rossetti, PA-C  ezetimibe (ZETIA) 10 MG tablet Take 1 tablet (10 mg total) by mouth daily. 06/25/22 12/22/22  Gillis Santa, MD  metoprolol succinate (TOPROL-XL) 25 MG 24 hr tablet Take 1 tablet (25 mg total) by mouth daily. Take with or immediately following a meal. 06/24/22 12/21/22  Gillis Santa, MD  Multiple Vitamin (MULTIVITAMIN WITH MINERALS) TABS tablet Take 1 tablet by mouth daily. 06/25/22 09/23/22  Gillis Santa, MD  Vitamin D, Ergocalciferol, (DRISDOL) 1.25 MG (50000 UNIT) CAPS capsule Take 1 capsule (50,000 Units total) by mouth every 7 (seven) days. 06/30/22 09/28/22  Gillis Santa, MD  - Unable to med rec family, needs formal med rec when/if able   Exam: Current vital signs: BP 120/61   Pulse 63   Temp 97.7 F (36.5 C) (Oral)   Resp 20   SpO2 100%    Physical Exam  Constitutional: Cachetic, ill-appearing patient in no acute distress Eyes: No scleral injection HENT: No OP obstruction, end-tidal CO2 monitor in place Head: Normocephalic.  Cardiovascular: Normal rate and regular rhythm.  Respiratory: Effort normal and regular on supplemental O2 GI: Soft.  No distension. There is no tenderness.  Skin: WDI MSK: Diffuse muscle wasting  Neuro: Mental Status: Patient rests with eyes open, does not respond to name or follow commands.  Will withdraw slightly to noxious stimuli in the extremities but does not localize sternal rub.  She moves all 4 extremities nonpurposefully spontaneously and to touch.  Right gaze deviation present, unable to cross midline to voice, but crosses just past midline with VOR. PERRL, symmetric response to eyelash brush.    I have reviewed labs in epic and the pertinent results are:    Latest Ref Rng & Units 07/23/2022   12:02 PM 06/24/2022    5:33 AM 06/22/2022    9:17 AM  CBC   WBC 4.0 - 10.5 K/uL 6.5  6.2  5.7   Hemoglobin 12.0 - 15.0 g/dL 09.8  11.9  14.7   Hematocrit 36.0 - 46.0 % 46.0  38.5  43.5   Platelets 150 - 400 K/uL 213  217  217        Latest Ref Rng & Units 07/23/2022   12:02 PM 06/24/2022    5:33 AM 06/23/2022    4:02 AM  BMP  Glucose 70 - 99 mg/dL 97  829  562   BUN 8 - 23 mg/dL 16  32  22   Creatinine 0.44 - 1.00 mg/dL 1.30  8.65  7.84   Sodium 135 - 145 mmol/L 140  134  137   Potassium 3.5 - 5.1 mmol/L 5.2  4.4  4.0   Chloride 98 - 111 mmol/L 108  105  105   CO2 22 - 32 mmol/L 22  24  24    Calcium 8.9 - 10.3  mg/dL 9.6  9.0  8.6   PT/INR pending  I have reviewed the images obtained:  CT head/CT angiogram: Acute IPH in right basal ganglia with possible trace IVH, remote left PCA and MCA territory infarcts, similar occlusion of right proximal M2 MCA branch, similar left ICA and neck with poor opacification location of left MCA and ACA vessels, similar occluded right vertebral artery and 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 Hemorrhagic conversion of ischemic stroke, less likely underlying mass -Admit to ICU -PT / PTT / INR -Strict blood pressure control with systolic between 161 and 150 for first 24 hours then less than 160, use Cleviprex if needed -Given history of non-adherence, family uncertain if patient is taking Eliquis, confirmation that no medications were taken this morning and very small ICH as well as significant stroke history and Afib, risk of reversal was felt to outweigh benefit -Stability CT scan in 4 hours -MRI brain with and without contrast -No antiplatelets or anticoagulants, use SCDs for DVT prophylaxis -PT/OT/SLP  # Seizure - MRI as above - LTM EEG with MRI compatible leads - Keppra 2g load, then  500 mg BID. Adjust as needed for renal  function:  CrCl cannot be calculated (Unknown ideal weight.).   CrCl 80 to 130 mL/minute/1.73 m2: 500 mg to 1.5 g every 12 hours.  CrCl 50 to <80 mL/minute/1.73 m2: 500 mg to 1 g every 12 hours.  CrCl 30 to <50 mL/minute/1.73 m2: 250 to 750 mg every 12 hours.  CrCl 15 to <30 mL/minute/1.73 m2: 250 to 500 mg every 12 hours.  CrCl <15 mL/minute/1.73 m2: 250 to 500 mg every 24 hours (expert opinion). - Magnesium - CK - UA, UDS - Chest X-ray  # Hyperkalemia - Possibly hemolyzed sample - Trend  # CAD - ECG, appreciate review by EDP without evidence of acute cardiac process - Troponin, trend (high risk of demand ischemia in the setting of seizures)  # HLD - Given ICH, will hold Zetia for now  # Dementia - Hold Aricept for now - Unable to med rec family, needs formal med rec when/if able  This patient is critically ill and at significant risk of neurological worsening, death and care requires constant monitoring of vital signs, hemodynamics,respiratory and cardiac monitoring, neurological assessment, discussion with family, other specialists and medical decision making of high complexity. I spent 60 minutes of neurocritical care time  in the care of  this patient. This was time spent independent of any time provided by nurse practitioner or PA.  Pt seen by NP/Neuro and later by MD. Note/plan to be edited by MD as needed.  Cortney E Ernestina Columbia , MSN, AGACNP-BC [facility] See Amion for schedule and pager information 07/23/2022 3:22 PM  Attending Neurologist's note:  I personally saw this patient, gathering history, performing a full neurologic examination, reviewing relevant labs, personally reviewing relevant imaging including Head CT, and formulated the assessment and plan, adding the note above for completeness and clarity to accurately reflect my thoughts  CRITICAL CARE Performed by: Gordy Councilman   Total critical care time: 40 minutes  Critical care time was  exclusive of separately billable procedures and treating other patients.  Critical care was necessary to treat or prevent imminent or life-threatening deterioration.  Critical care was time spent personally by me on the following activities:  development of treatment plan with patient and/or surrogate as well as nursing, discussions with consultants, evaluation of patient's response to treatment, examination of patient, obtaining history from patient or surrogate, ordering and performing treatments and interventions, ordering and review of laboratory studies, ordering and review of radiographic studies, pulse oximetry and re-evaluation of patient's condition.

## 2022-07-23 NOTE — Progress Notes (Signed)
STAT LTM EEG hooked up and running - no initial skin breakdown - push button tested - MRI leads used.

## 2022-07-23 NOTE — Telephone Encounter (Signed)
Returned call to [name] regarding scheduling LDCT for patient.  No answer. Left VM

## 2022-07-23 NOTE — ED Notes (Signed)
ED TO INPATIENT HANDOFF REPORT  ED Nurse Name and Phone #: [phone]  S Name/Age/Gender [name] [name] 72 y.o. female Room/Bed: 015C/015C  Code Status   Code Status: Full Code  Home/SNF/Other Nursing Home  Is this baseline? No   Triage Complete: Triage complete  Chief Complaint ICH (intracerebral hemorrhage) (HCC) [I61.9]  Triage Note [facility]. EMS was dispatched d/t family member reporting the pt was unresponsive. 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 Family was poor historians, unable to provide baseline info. Pt had multiple seizures en route w EMS. 2.5 mg IM VERSED was given by EMS. VSS stable .  100% on 4L  120/65 BP  HR 58    Allergies No Known Allergies  Level of Care/Admitting Diagnosis ED Disposition     ED Disposition  Admit   Condition  --   Comment  Hospital Area: [facility] [[id number]]  Level of Care: ICU [6]  May admit patient to [facility] or [facility] if equivalent level of care is available:: No  Covid Evaluation: Asymptomatic - no recent exposure (last 10 days) testing not required  Diagnosis: ICH (intracerebral hemorrhage) [facility]) [[id number]]  Admitting Physician: Gordy Councilman [[id number]]  Attending Physician: Gordy Councilman [[id number]]  Certification:: I certify this patient will need inpatient services for at least 2 midnights  Estimated Length of Stay: 5          B Medical/Surgery History Past Medical History:  Diagnosis Date   Acute ischemic left MCA stroke (HCC) 03/11/2017   Hyperlipidemia LDL goal < 70 07/24/2012   Hypertension    S/P CABG x 5 : LIMA-LAD, SVG-OM1-OM2, SVG-(Y)-DIAG-PDA. 07/25/12 07/28/2012   POSTOPERATIVE DIAGNOSIS: Severe 3-vessel coronary disease, status post  myocardial infarction.  PROCEDURE: Median sternotomy, extracorporeal circulation, coronary  artery bypass grafting x5 (left internal mammary artery to LAD,  sequential saphenous vein graft to obtuse marginal 1,  obtuse marginal 2,  and posterior descending with Y graft to 1st diagonal), endoscopic vein  harvest, left thigh.     Seizures (HCC)    "years ago" (03/11/2017)   STEMI (ST elevation myocardial infarction), secondary to disease of RCA, with disease of LAD and diag. branch and chronic non dominant LCX occlusion for CABG  07/21/2012   Stroke [facility]) ?08/2012   "right sided weakness; speech isssues since" (03/11/2017)   Tobacco abuse 07/21/2012   Past Surgical History:  Procedure Laterality Date   CARDIAC CATHETERIZATION     CESAREAN SECTION  X 1   CORONARY ARTERY BYPASS GRAFT N/A 07/25/2012   Procedure: CORONARY ARTERY BYPASS GRAFTING (CABG) times five with Left  Endoscopic Saphenous Vein Harvest and Left Internal  Mammary ;  Surgeon: Loreli Slot, MD;  Location: [facility] OR;  Service: Open Heart Surgery;  Laterality: N/A;   DILATION AND CURETTAGE OF UTERUS     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   LEFT HEART CATHETERIZATION WITH CORONARY ANGIOGRAM N/A 07/21/2012   Procedure: LEFT HEART CATHETERIZATION WITH CORONARY ANGIOGRAM;  Surgeon: [name], MD;  Location: [facility] CATH LAB;  Service: Cardiovascular;  Laterality: N/A;   LOOP RECORDER IMPLANT  08-15-2012   Medtronic LinQ implanted by Dr Johney Frame for cryptogenic stroke   TEE WITHOUT CARDIOVERSION N/A 08/15/2012   Procedure: TRANSESOPHAGEAL ECHOCARDIOGRAM (TEE);  Surgeon: Wendall Stade, MD;  Location: [facility] ENDOSCOPY;  Service: Cardiovascular;  Laterality: N/A;   TUBAL LIGATION       A IV Location/Drains/Wounds Patient  Lines/Drains/Airways Status     Active Line/Drains/Airways     Name Placement date Placement time Site Days   Peripheral IV 07/23/22 20 G Anterior;Proximal;Right Forearm 07/23/22  1241  Forearm  less than 1   Peripheral IV 07/23/22 20 G Posterior;Left Hand 07/23/22  1241  Hand  less than 1            Intake/Output Last 24 hours No intake or output data in the 24 hours ending 07/23/22 1542  Labs/Imaging Results for  orders placed or performed during the hospital encounter of 07/23/22 (from the past 48 hour(s))  Comprehensive metabolic panel     Status: Abnormal   Collection Time: 07/23/22 12:02 PM  Result Value Ref Range   Sodium 140 135 - 145 mmol/L   Potassium 5.2 (H) 3.5 - 5.1 mmol/L   Chloride 108 98 - 111 mmol/L   CO2 22 22 - 32 mmol/L   Glucose, Bld 97 70 - 99 mg/dL    Comment: Glucose reference range applies only to samples taken after fasting for at least 8 hours.   BUN 16 8 - 23 mg/dL   Creatinine, Ser 4.69 (H) 0.44 - 1.00 mg/dL   Calcium 9.6 8.9 - 62.9 mg/dL   Total Protein 7.5 6.5 - 8.1 g/dL   Albumin 3.6 3.5 - 5.0 g/dL   AST 36 15 - 41 U/L   ALT 21 0 - 44 U/L   Alkaline Phosphatase 74 38 - 126 U/L   Total Bilirubin 0.7 0.3 - 1.2 mg/dL   GFR, Estimated 56 (L) >60 mL/min    Comment: (NOTE) Calculated using the CKD-EPI Creatinine Equation (2021)    Anion gap 10 5 - 15    Comment: Performed at [facility], [address] [place], [place] [address]  CBC with Differential/Platelet     Status: None   Collection Time: 07/23/22 12:02 PM  Result Value Ref Range   WBC 6.5 4.0 - 10.5 K/uL   RBC 4.88 3.87 - 5.11 MIL/uL   Hemoglobin 14.8 12.0 - 15.0 g/dL   HCT 32.4 40.1 - 02.7 %   MCV 94.3 80.0 - 100.0 fL   MCH 30.3 26.0 - 34.0 pg   MCHC 32.2 30.0 - 36.0 g/dL   RDW 25.3 66.4 - 40.3 %   Platelets 213 150 - 400 K/uL   nRBC 0.0 0.0 - 0.2 %   Neutrophils Relative % 77 %   Neutro Abs 5.0 1.7 - 7.7 K/uL   Lymphocytes Relative 15 %   Lymphs Abs 1.0 0.7 - 4.0 K/uL   Monocytes Relative 7 %   Monocytes Absolute 0.5 0.1 - 1.0 K/uL   Eosinophils Relative 0 %   Eosinophils Absolute 0.0 0.0 - 0.5 K/uL   Basophils Relative 1 %   Basophils Absolute 0.1 0.0 - 0.1 K/uL   Immature Granulocytes 0 %   Abs Immature Granulocytes 0.01 0.00 - 0.07 K/uL    Comment: Performed at [facility], [address], [place], [place] [address]  Ethanol     Status: None   Collection Time: 07/23/22  12:03 PM  Result Value Ref Range   Alcohol, Ethyl (B) <10 <10 mg/dL    Comment: (NOTE) Lowest detectable limit for serum alcohol is 10 mg/dL.  For medical purposes only. Performed at [facility], [address], [place], [place] [address]   Lactic acid, plasma     Status: Abnormal   Collection Time: 07/23/22 12:38 PM  Result  Value Ref Range   Lactic Acid, Venous 2.7 (HH) 0.5 - 1.9 mmol/L    Comment: CRITICAL RESULT CALLED TO, READ BACK BY AND VERIFIED WITH Jams Trickett [email or web address]  ON 6.21.24 BY TCALDWELL MT. Performed at [facility], [address], [place], [place] [address]   CBG monitoring, ED     Status: None   Collection Time: 07/23/22 12:47 PM  Result Value Ref Range   Glucose-Capillary 88 70 - 99 mg/dL    Comment: Glucose reference range applies only to samples taken after fasting for at least 8 hours.   CT Angio Head Neck W WO CM  Result Date: 07/23/2022 CLINICAL DATA:  Neuro deficit, acute, stroke suspected unknown last known well EXAM: CT ANGIOGRAPHY HEAD AND NECK WITH AND WITHOUT CONTRAST TECHNIQUE: Multidetector CT imaging of the head and neck was performed using the standard protocol during bolus administration of intravenous contrast. Multiplanar CT image reconstructions and MIPs were obtained to evaluate the vascular anatomy. Carotid stenosis measurements (when applicable) are obtained utilizing NASCET criteria, using the distal internal carotid diameter as the denominator. RADIATION DOSE REDUCTION: This exam was performed according to the departmental dose-optimization program which includes automated exposure control, adjustment of the mA and/or kV according to patient size and/or use of iterative reconstruction technique. CONTRAST:  75 mL of Omnipaque 350 IV. COMPARISON:  CT head and CTA head/neck Jun 22, 2022. FINDINGS: CT HEAD FINDINGS Brain: Acute 1.1 x 0.9 x 1.2 cm intraparenchymal hemorrhage in the right basal ganglia (estimated volume of 0.6 mL). Possible  trace intraventricular extension of hemorrhage within the body of the right lateral ventricle. Multiple remote infarcts in the left MCA and PCA territories. No evidence of acute large vascular territory infarct. No midline shift. No hydrocephalus. Vascular: See below. Skull: No acute fracture. Sinuses/Orbits: Clear visualized sinuses. No acute orbital findings. Other: No mastoid effusions. Review of the MIP images confirms the above findings CTA NECK FINDINGS Aortic arch: Great vessel origins are incompletely imaged. Visualized great vessel origins are patent with calcific atherosclerosis. Right carotid system: Similar dense calcific atherosclerosis involving the common carotid artery, carotid bifurcation and ICA with approximately 75% stenosis of the cervical ICA. Left carotid system: Similar dense calcified atherosclerosis of the common carotid artery with occlusion of the left cervical ICA. 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 Similar severe stenosis of the left vertebral artery origin. Skeleton: No acute findings on limited assessment. Other neck: No acute findings on limited assessment. Upper chest: Emphysema.  Aortic atherosclerosis. Review of the MIP images confirms the above findings CTA HEAD FINDINGS Anterior circulation: Non-opacified left intracranial ICA, chronic. Poorly opacified in small left MCA and ACA, similar. Right M1 MCAs patent. Similar occlusion of a proximal right M2 MCA branch. Posterior circulation: Poor flow within the right vertebral artery due to above. Left vertebral artery, Venous sinuses: Not well assessed due to arterial timing. Review of the MIP images confirms the above findings IMPRESSION: CT head: 1. Acute 1.1 x 0.9 x 1.2 cm intraparenchymal hemorrhage in the right basal ganglia. 2. Possible trace intraventricular extension of hemorrhage within the body of the right lateral ventricle. 3. Remote left MCA and PCA territory  infarcts. No definite evidence of new/interval acute large vascular territory infarct; however, MRI could provide more sensitive evaluation if clinically warranted. CTA: 1. Similar occlusion of a right proximal M2 MCA branch. 2. Similar occlusion of the left ICA in the neck with poor opacification of small left MCA and  ACA vessels. 3. Similar occluded right vertebral artery. 4. Similar severe stenosis of the left vertebral artery origin. 5. Aortic Atherosclerosis (ICD10-I70.0) and Emphysema (ICD10-J43.9). Findings discussed with Patrice Paradise, PA via telephone at 1:24 p.m. Electronically Signed   By: Feliberto Harts M.D.   On: 07/23/2022 13:28    Pending Labs Unresulted Labs (From admission, onward)     Start     Ordered   07/24/22 0500  CBC  Tomorrow morning,   R        07/23/22 1521   07/24/22 0500  Comprehensive metabolic panel  Tomorrow morning,   R        07/23/22 1521   07/24/22 0500  Hemoglobin A1c  Tomorrow morning,   R        07/23/22 1521   07/24/22 0500  Lipid panel  Tomorrow morning,   R        07/23/22 1521   07/23/22 1600  Basic metabolic panel  Once,   R        07/23/22 1521   07/23/22 1534  Protime-INR  Once,   R        07/23/22 1533   07/23/22 1534  APTT  Once,   R        07/23/22 1533   07/23/22 1214  Lactic acid, plasma  Now then every 2 hours,   R      07/23/22 1213   07/23/22 1203  Rapid urine drug screen (hospital performed)  Once,   STAT        07/23/22 1203   07/23/22 1202  Urinalysis, Routine w reflex microscopic -Urine, Clean Catch  Once,   URGENT       Question:  Specimen Source  Answer:  Urine, Clean Catch   07/23/22 1203            Vitals/Pain Today's Vitals   07/23/22 1410 07/23/22 1415 07/23/22 1430 07/23/22 1445  BP: (!) 129/59 (!) 153/98 (!) 125/95 120/61  Pulse: (!) 57 92 88 63  Resp: (!) 24 (!) 28 (!) 31 20  Temp:      TempSrc:      SpO2: 100% 100% 100% 100%    Isolation Precautions No active isolations  Medications Medications   clevidipine (CLEVIPREX) infusion 0.5 mg/mL (2 mg/hr Intravenous New Bag/Given 07/23/22 1357)   stroke: early stages of recovery book (has no administration in time range)  acetaminophen (TYLENOL) tablet 650 mg (has no administration in time range)    Or  acetaminophen (TYLENOL) 160 MG/5ML solution 650 mg (has no administration in time range)    Or  acetaminophen (TYLENOL) suppository 650 mg (has no administration in time range)  senna-docusate (Senokot-S) tablet 1 tablet (1 tablet Oral Not Given 07/23/22 1541)  pantoprazole (PROTONIX) injection 40 mg (has no administration in time range)  0.9 %  sodium chloride infusion (has no administration in time range)  levETIRAcetam (KEPPRA) IVPB 500 mg/100 mL premix (has no administration in time range)  iohexol (OMNIPAQUE) 350 MG/ML injection 75 mL (75 mLs Intravenous Contrast Given 07/23/22 1319)  levETIRAcetam (KEPPRA) IVPB 1000 mg/100 mL premix (0 mg Intravenous Stopped 07/23/22 1446)    And  levETIRAcetam (KEPPRA) IVPB 1000 mg/100 mL premix (0 mg Intravenous Stopped 07/23/22 1446)    Mobility non-ambulatory     Focused Assessments    R Recommendations: See Admitting Provider Note  Report given to:   Additional Notes:

## 2022-07-23 NOTE — ED Triage Notes (Signed)
Pt [facility]. EMS was dispatched d/t family member reporting the pt was unresponsive. 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 Family was poor historians, unable to provide baseline info. Pt had multiple seizures en route w EMS. 2.5 mg IM VERSED was given by EMS. VSS stable .  100% on 4L  120/65 BP  HR 58

## 2022-07-23 NOTE — ED Provider Notes (Signed)
[place] EMERGENCY DEPARTMENT AT [facility] Provider Note   CSN: [id number] Arrival date & time: 07/23/22  1152     History  Chief Complaint  Patient presents with   Seizures    [name] [name] is a 72 y.o. female with past medical history significant for hypertension, STEMI, tobacco abuse, CVA, seizures presents to the ED by EMS from a motel where she lives.  EMS was dispatched due to family member reporting patient was unresponsive.  Patient has had multiple strokes in the past with most recent 1 on 06/22/2022.  Patient unresponsive and unable to answer questions.  EMS report family poor historians and unable to provide baseline information.  Per EMS, patient had multiple seizures and route and was given 2.5 mg IM Versed.       Home Medications Prior to Admission medications   Medication Sig Start Date End Date Taking? Authorizing Provider  apixaban (ELIQUIS) 5 MG TABS tablet Take 1 tablet (5 mg total) by mouth 2 (two) times daily. 03/21/17   Angiulli, Mcarthur Rossetti, PA-C  atorvastatin (LIPITOR) 80 MG tablet Take 80 mg by mouth daily.    [provider]  donepezil (ARICEPT) 5 MG tablet Take 1 tablet (5 mg total) by mouth at bedtime. 03/21/17   Angiulli, Mcarthur Rossetti, PA-C  ezetimibe (ZETIA) 10 MG tablet Take 1 tablet (10 mg total) by mouth daily. 06/25/22 12/22/22  Gillis Santa, MD  metoprolol succinate (TOPROL-XL) 25 MG 24 hr tablet Take 1 tablet (25 mg total) by mouth daily. Take with or immediately following a meal. 06/24/22 12/21/22  Gillis Santa, MD  Multiple Vitamin (MULTIVITAMIN WITH MINERALS) TABS tablet Take 1 tablet by mouth daily. 06/25/22 09/23/22  Gillis Santa, MD  Vitamin D, Ergocalciferol, (DRISDOL) 1.25 MG (50000 UNIT) CAPS capsule Take 1 capsule (50,000 Units total) by mouth every 7 (seven) days. 06/30/22 09/28/22  Gillis Santa, MD      Allergies    Patient has no known allergies.    Review of Systems   Review of Systems  Unable to perform ROS: Patient  unresponsive  Neurological:  Positive for seizures.    Physical Exam Updated Vital Signs BP 120/61   Pulse 63   Temp 97.7 F (36.5 C) (Oral)   Resp 20   SpO2 100%  Physical Exam Vitals and nursing note reviewed.  Constitutional:      General: She is not in acute distress.    Appearance: Normal appearance. She is not ill-appearing or diaphoretic.  HENT:     Head: Normocephalic and atraumatic.     Nose: Nose normal.     Mouth/Throat:     Mouth: Mucous membranes are dry.     Pharynx: Oropharynx is clear.  Eyes:     Pupils: Pupils are equal, round, and reactive to light.  Cardiovascular:     Rate and Rhythm: Regular rhythm. Bradycardia present.  Pulmonary:     Effort: Pulmonary effort is normal.     Breath sounds: Normal breath sounds and air entry.  Neurological:     Mental Status: She is unresponsive.     GCS: GCS eye subscore is 1. GCS verbal subscore is 2. GCS motor subscore is 4.     Comments: Unable to complete neuro exam due to patient's altered mental status.    Psychiatric:        Mood and Affect: Mood normal.        Behavior: Behavior normal.     ED Results / Procedures /  Treatments   Labs (all labs ordered are listed, but only abnormal results are displayed) Labs Reviewed  COMPREHENSIVE METABOLIC PANEL - Abnormal; Notable for the following components:      Result Value   Potassium 5.2 (*)    Creatinine, Ser 1.04 (*)    GFR, Estimated 56 (*)    All other components within normal limits  LACTIC ACID, PLASMA - Abnormal; Notable for the following components:   Lactic Acid, Venous 2.7 (*)    All other components within normal limits  CBC WITH DIFFERENTIAL/PLATELET  ETHANOL  URINALYSIS, ROUTINE W REFLEX MICROSCOPIC  RAPID URINE DRUG SCREEN, HOSP PERFORMED  LACTIC ACID, PLASMA  CBG MONITORING, ED  I-STAT CHEM 8, ED    EKG EKG Interpretation  Date/Time:  Friday July 23 2022 12:10:20 EDT Ventricular Rate:  57 PR Interval:  140 QRS Duration: 143 QT  Interval:  480 QTC Calculation: 468 R Axis:   189 Text Interpretation: Sinus rhythm Consider right atrial enlargement Right bundle branch block Probable lateral infarct, age indeterminate Now in sinus compared to prior EKG Confirmed by Elayne Snare ([id number]) on 07/23/2022 12:27:54 PM  Radiology CT Angio Head Neck W WO CM  Result Date: 07/23/2022 CLINICAL DATA:  Neuro deficit, acute, stroke suspected unknown last known well EXAM: CT ANGIOGRAPHY HEAD AND NECK WITH AND WITHOUT CONTRAST TECHNIQUE: Multidetector CT imaging of the head and neck was performed using the standard protocol during bolus administration of intravenous contrast. Multiplanar CT image reconstructions and MIPs were obtained to evaluate the vascular anatomy. Carotid stenosis measurements (when applicable) are obtained utilizing NASCET criteria, using the distal internal carotid diameter as the denominator. RADIATION DOSE REDUCTION: This exam was performed according to the departmental dose-optimization program which includes automated exposure control, adjustment of the mA and/or kV according to patient size and/or use of iterative reconstruction technique. CONTRAST:  75 mL of Omnipaque 350 IV. COMPARISON:  CT head and CTA head/neck Jun 22, 2022. FINDINGS: CT HEAD FINDINGS Brain: Acute 1.1 x 0.9 x 1.2 cm intraparenchymal hemorrhage in the right basal ganglia (estimated volume of 0.6 mL). Possible trace intraventricular extension of hemorrhage within the body of the right lateral ventricle. Multiple remote infarcts in the left MCA and PCA territories. No evidence of acute large vascular territory infarct. No midline shift. No hydrocephalus. Vascular: See below. Skull: No acute fracture. Sinuses/Orbits: Clear visualized sinuses. No acute orbital findings. Other: No mastoid effusions. Review of the MIP images confirms the above findings CTA NECK FINDINGS Aortic arch: Great vessel origins are incompletely imaged. Visualized great vessel origins  are patent with calcific atherosclerosis. Right carotid system: Similar dense calcific atherosclerosis involving the common carotid artery, carotid bifurcation and ICA with approximately 75% stenosis of the cervical ICA. Left carotid system: Similar dense calcified atherosclerosis of the common carotid artery with occlusion of the left cervical ICA. 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 Similar severe stenosis of the left vertebral artery origin. Skeleton: No acute findings on limited assessment. Other neck: No acute findings on limited assessment. Upper chest: Emphysema.  Aortic atherosclerosis. Review of the MIP images confirms the above findings CTA HEAD FINDINGS Anterior circulation: Non-opacified left intracranial ICA, chronic. Poorly opacified in small left MCA and ACA, similar. Right M1 MCAs patent. Similar occlusion of a proximal right M2 MCA branch. Posterior circulation: Poor flow within the right vertebral artery due to above. Left vertebral artery, Venous sinuses: Not well assessed due to arterial timing. Review of the MIP images  confirms the above findings IMPRESSION: CT head: 1. Acute 1.1 x 0.9 x 1.2 cm intraparenchymal hemorrhage in the right basal ganglia. 2. Possible trace intraventricular extension of hemorrhage within the body of the right lateral ventricle. 3. Remote left MCA and PCA territory infarcts. No definite evidence of new/interval acute large vascular territory infarct; however, MRI could provide more sensitive evaluation if clinically warranted. CTA: 1. Similar occlusion of a right proximal M2 MCA branch. 2. Similar occlusion of the left ICA in the neck with poor opacification of small left MCA and ACA vessels. 3. Similar occluded right vertebral artery. 4. Similar severe stenosis of the left vertebral artery origin. 5. Aortic Atherosclerosis (ICD10-I70.0) and Emphysema (ICD10-J43.9). Findings discussed with Patrice Paradise, PA via  telephone at 1:24 p.m. Electronically Signed   By: Feliberto Harts M.D.   On: 07/23/2022 13:28    Procedures .Critical Care  Performed by: Lenard Simmer, PA-C Authorized by: Lenard Simmer, PA-C   Critical care provider statement:    Critical care time (minutes):  40   Critical care time was exclusive of:  Separately billable procedures and treating other patients   Critical care was necessary to treat or prevent imminent or life-threatening deterioration of the following conditions:  CNS failure or compromise   Critical care was time spent personally by me on the following activities:  Development of treatment plan with patient or surrogate, discussions with consultants, evaluation of patient's response to treatment, examination of patient, obtaining history from patient or surrogate, ordering and performing treatments and interventions, ordering and review of laboratory studies, ordering and review of radiographic studies, pulse oximetry, re-evaluation of patient's condition and review of old charts   I assumed direction of critical care for this patient from another provider in my specialty: no     Care discussed with: admitting provider       Medications Ordered in ED Medications  clevidipine (CLEVIPREX) infusion 0.5 mg/mL (2 mg/hr Intravenous New Bag/Given 07/23/22 1357)  iohexol (OMNIPAQUE) 350 MG/ML injection 75 mL (75 mLs Intravenous Contrast Given 07/23/22 1319)  levETIRAcetam (KEPPRA) IVPB 1000 mg/100 mL premix (0 mg Intravenous Stopped 07/23/22 1446)    And  levETIRAcetam (KEPPRA) IVPB 1000 mg/100 mL premix (0 mg Intravenous Stopped 07/23/22 1446)    ED Course/ Medical Decision Making/ A&P                             Medical Decision Making Amount and/or Complexity of Data Reviewed Labs: ordered. Radiology: ordered.  Risk Prescription drug management. Decision regarding hospitalization.   This patient presents to the ED with chief complaint(s) of altered mental  status, unresponsiveness with pertinent past medical history of CVA, seizure.  The complaint involves an extensive differential diagnosis and also carries with it a high risk of complications and morbidity.    The differential diagnosis includes CVA, seizures, infectious etiology, metabolic derangement   The initial plan is to obtain labs, CT imaging  Additional history obtained: Additional history obtained from EMS , family - per family, patient is normally able to walk, speak and take care of herself but began having altered mental status last night and was found in the fetal position this morning having seizure-like activity, which is when they called EMS Records reviewed previous admission documents - patient was admitted 06/22/22 for acute CVA with   Initial Assessment:   On exam, patient is unresponsive with a GCS of 7.  She does withdraw to  pain and moans, but does not open eyes spontaneously.  PERRL.  Heart rate is normal in the 60s with regular rhythm.  Lungs are clear to auscultation bilaterally.  Skin is warm and dry.  Mucous membranes slightly dry.  Independent ECG/labs interpretation:  The following labs were independently interpreted:  CBC without leukocytosis or anemia.  Metabolic panel with mild hyperkalemia and mildly elevated Cr.  Initial lactic acid is 2.7.  Head is normocephalic atraumatic.  No obvious gross deformities or injuries on exam.  Unable to complete neuroexam due to altered mental status/unresponsiveness.  Independent visualization and interpretation of imaging: I independently visualized the following imaging with scope of interpretation limited to determining acute life threatening conditions related to emergency care: CT head, which revealed acute intraparenchymal hemorrhage in right basal ganglia with possible trace intraventricular extension within the body of the right lateral ventricle.  Remote left MCA and PCA territory infarcts without definite evidence of  new/interval acute large vascular territory infarct.  CTA neck with similar occlusions to prior from 06/2022.    Treatment and Reassessment: Upon reassessment, patient GCS has improved to 12.  Patient's family states that at her baseline she is able to talk, but can sometimes be incoherent and she has left sided weakness from prior stroke.  She does have gaze deviation to the right.  Patient started on a Cleviprex infusion for blood pressure control.  Consultations obtained:   I requested consultation with on-call neurology provider and spoke with Dr. Iver Nestle who recommended giving 2g of Keppra for seizures.  Dr. Iver Nestle will come see patient.    I requested consultation with on-call neurosurgery provider and spoke with Dr. Maisie Fus who does not recommend surgical intervention at this time.      Disposition:   Patient will be admitted by neurology for acute intraparenchymal hemorrhage.            Final Clinical Impression(s) / ED Diagnoses Final diagnoses:  Intraparenchymal hemorrhage of brain (HCC)  Seizures [facility])    Rx / DC Orders ED Discharge Orders     None         Lenard Simmer, PA-C 07/23/22 1513    Rexford Maus, DO 07/23/22 1518

## 2022-07-23 NOTE — ED Notes (Signed)
Lactic 2.7. PA made aware.

## 2022-07-24 ENCOUNTER — Inpatient Hospital Stay (HOSPITAL_COMMUNITY): Payer: 59

## 2022-07-24 DIAGNOSIS — R569 Unspecified convulsions: Secondary | ICD-10-CM | POA: Diagnosis not present

## 2022-07-24 DIAGNOSIS — I6389 Other cerebral infarction: Secondary | ICD-10-CM | POA: Diagnosis not present

## 2022-07-24 DIAGNOSIS — I480 Paroxysmal atrial fibrillation: Secondary | ICD-10-CM | POA: Diagnosis not present

## 2022-07-24 LAB — COMPREHENSIVE METABOLIC PANEL
ALT: 30 U/L (ref 0–44)
AST: 72 U/L — ABNORMAL HIGH (ref 15–41)
Albumin: 3.2 g/dL — ABNORMAL LOW (ref 3.5–5.0)
Alkaline Phosphatase: 61 U/L (ref 38–126)
Anion gap: 12 (ref 5–15)
BUN: 12 mg/dL (ref 8–23)
CO2: 18 mmol/L — ABNORMAL LOW (ref 22–32)
Calcium: 8.9 mg/dL (ref 8.9–10.3)
Chloride: 107 mmol/L (ref 98–111)
Creatinine, Ser: 0.88 mg/dL (ref 0.44–1.00)
GFR, Estimated: >60 mL/min (ref >60)
Glucose, Bld: 86 mg/dL (ref 70–99)
Potassium: 3.7 mmol/L (ref 3.5–5.1)
Sodium: 137 mmol/L (ref 135–145)
Total Bilirubin: 1.2 mg/dL (ref 0.3–1.2)
Total Protein: 6.7 g/dL (ref 6.5–8.1)

## 2022-07-24 LAB — CBC
HCT: 42.8 % (ref 36.0–46.0)
Hemoglobin: 14.1 g/dL (ref 12.0–15.0)
MCH: 30.9 pg (ref 26.0–34.0)
MCHC: 32.9 g/dL (ref 30.0–36.0)
MCV: 93.7 fL (ref 80.0–100.0)
Platelets: 217 10*3/uL (ref 150–400)
RBC: 4.57 MIL/uL (ref 3.87–5.11)
RDW: 13.8 % (ref 11.5–15.5)
WBC: 7.6 10*3/uL (ref 4.0–10.5)
nRBC: 0 % (ref 0.0–0.2)

## 2022-07-24 LAB — HEMOGLOBIN A1C
Hgb A1c MFr Bld: 4.9 % (ref 4.8–5.6)
Mean Plasma Glucose: 93.93 mg/dL

## 2022-07-24 LAB — LIPID PANEL
Cholesterol: 136 mg/dL (ref 0–200)
HDL: 51 mg/dL (ref >40)
LDL Cholesterol: 24 mg/dL (ref 0–99)
Total CHOL/HDL Ratio: 2.7 RATIO
Triglycerides: 304 mg/dL — ABNORMAL HIGH (ref <150)
VLDL: 61 mg/dL — ABNORMAL HIGH (ref 0–40)

## 2022-07-24 LAB — MRSA NEXT GEN BY PCR, NASAL: MRSA by PCR Next Gen: NOT DETECTED

## 2022-07-24 LAB — GLUCOSE, CAPILLARY: Glucose-Capillary: 88 mg/dL (ref 70–99)

## 2022-07-24 MED ORDER — GADOBUTROL 1 MMOL/ML IV SOLN
4.0000 mL | Freq: Once | INTRAVENOUS | Status: AC | PRN
  Administered 2022-07-24: 4 mL via INTRAVENOUS

## 2022-07-24 MED ORDER — ORAL CARE MOUTH RINSE
15.0000 mL | OROMUCOSAL | Status: DC
  Administered 2022-07-24 – 2022-08-06 (×53): 15 mL via OROMUCOSAL

## 2022-07-24 MED ORDER — CHLORHEXIDINE GLUCONATE CLOTH 2 % EX PADS
6.0000 | MEDICATED_PAD | Freq: Every day | CUTANEOUS | Status: DC
  Administered 2022-07-24 – 2022-07-26 (×3): 6 via TOPICAL

## 2022-07-24 MED ORDER — ORAL CARE MOUTH RINSE: 15.0000 mL | OROMUCOSAL | Status: DC | PRN

## 2022-07-24 NOTE — Procedures (Signed)
Patient Name: [name] [name]  MRN: [id number]  Epilepsy Attending: Charlsie Quest  Referring Physician/Provider: Marjorie Smolder, NP  Duration: 07/23/2022 1457 to 07/24/2022 1457  Patient history: 72yo F with right BG ICH getting eeg to evaluate for seizure  Level of alertness: Awake, asleep  AEDs during EEG study: LEV  Technical aspects: This EEG study was done with scalp electrodes positioned according to the 10-20 International system of electrode placement. Electrical activity was reviewed with band pass filter of 1-70Hz , sensitivity of 7 uV/mm, display speed of 39mm/sec with a 60Hz  notched filter applied as appropriate. EEG data were recorded continuously and digitally stored.  Video monitoring was available and reviewed as appropriate.  Description: The posterior dominant rhythm consists of 8.5 Hz activity of moderate voltage (25-35 uV) seen predominantly in posterior head regions, symmetric and reactive to eye opening and eye closing. Sleep was characterized by vertex waves, sleep spindles (12 to 14 Hz), maximal frontocentral region. EEG showed continuous 3 to 6 Hz theta-delta slowing in right frontotemporal region. Hyperventilation and photic stimulation were not performed.     ABNORMALITY - Continuous slow, right frontotemporal region   IMPRESSION: This study is suggestive of cortical dysfunction arising from right frontotemporal region, likely secondary to underlying ICH. No seizures or epileptiform discharges were seen throughout the recording.  Chon Buhl Annabelle Harman

## 2022-07-24 NOTE — Progress Notes (Signed)
MB performed maintenance and checks on  Cz, F8, C3, and O1, No skin breakdown

## 2022-07-24 NOTE — Evaluation (Signed)
Clinical/Bedside Swallow Evaluation [name]: [id number] Date of Birth: August 09, 1950  Today's Date: 07/24/2022 Time: SLP Start Time (ACUTE ONLY): 1149 SLP Stop Time (ACUTE ONLY): 1208 SLP Time Calculation (min) (ACUTE ONLY): 19 min  Past Medical History:  Past Medical History:  Diagnosis Date   Acute ischemic left MCA stroke (HCC) 03/11/2017   Hyperlipidemia LDL goal < 70 07/24/2012   Hypertension    S/P CABG x 5 : LIMA-LAD, SVG-OM1-OM2, SVG-(Y)-DIAG-PDA. 07/25/12 07/28/2012   POSTOPERATIVE DIAGNOSIS: Severe 3-vessel coronary disease, status post  myocardial infarction.  PROCEDURE: Median sternotomy, extracorporeal circulation, coronary  artery bypass grafting x5 (left internal mammary artery to LAD,  sequential saphenous vein graft to obtuse marginal 1, obtuse marginal 2,  and posterior descending with Y graft to 1st diagonal), endoscopic vein  harvest, left thigh.     Seizures (HCC)    "years ago" (03/11/2017)   STEMI (ST elevation myocardial infarction), secondary to disease of RCA, with disease of LAD and diag. branch and chronic non dominant LCX occlusion for CABG  07/21/2012   Stroke [facility]) ?08/2012   "right sided weakness; speech isssues since" (03/11/2017)   Tobacco abuse 07/21/2012   Past Surgical History:  Past Surgical History:  Procedure Laterality Date   CARDIAC CATHETERIZATION     CESAREAN SECTION  X 1   CORONARY ARTERY BYPASS GRAFT N/A 07/25/2012   Procedure: CORONARY ARTERY BYPASS GRAFTING (CABG) times five with Left  Endoscopic Saphenous Vein Harvest and Left Internal  Mammary ;  Surgeon: Loreli Slot, MD;  Location[facility];  Service: Open Heart Surgery;  Laterality: N/A;   DILATION AND CURETTAGE OF UTERUS     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   LEFT HEART CATHETERIZATION WITH CORONARY ANGIOGRAM N/A 07/21/2012   Procedure: LEFT HEART CATHETERIZATION WITH CORONARY ANGIOGRAM;  Surgeon: [name], MD;  Location: [facility] CATH LAB;   Service: Cardiovascular;  Laterality: N/A;   LOOP RECORDER IMPLANT  08-15-2012   Medtronic LinQ implanted by Dr Johney Frame for cryptogenic stroke   TEE WITHOUT CARDIOVERSION N/A 08/15/2012   Procedure: TRANSESOPHAGEAL ECHOCARDIOGRAM (TEE);  Surgeon: Wendall Stade, MD;  Location: [facility] ENDOSCOPY;  Service: Cardiovascular;  Laterality: N/A;   TUBAL LIGATION     HPI:  72 y.o. female presented to ED via EMS with AMS, seizure. PMHx hypertension, hyperlipidemia, STEMI, CABG, tobacco use, seizures, 4 strokes with most recent last month (bedside swallow eval with recs for dysphagia 2/chopped, thin liquids). MRI: Acute right MCA distribution infarct underlies the petechial hemorrhage in the right putamen. Tiny acute infarct in the superior left frontal lobe.2. Extensive chronic ischemic injury especially in the left cerebral hemisphere. Currently, patient on long-term EEG.    Assessment / Plan / Recommendation  Clinical Impression  Pt participated in preliminary swallowing assessment. Sister at the bedside. She alerted to name, opened eyes and vocalized.  Right gaze preference persists.  She accepted ice chip, 1/2 teaspoons of water with max cues needed to seal lips around spoon. There was poor oral recognition, bolus holding, eventual passive spilling of water to throat; noted spontaneous swallow followed by coughing. Pt is not ready for POs.  D/W her sister.  Recommend continued NPO; SLP will follow for readiness to start a diet vs instrumental swallow study. Consider enteral feeding if c/w goals. SLP Visit Diagnosis: Dysphagia, unspecified (R13.10)    Aspiration Risk  Severe aspiration risk    Diet Recommendation   NPO  Other  Recommendations Oral Care Recommendations: Oral care QID    Recommendations for follow up therapy are one component of a multi-disciplinary discharge planning process, led by the attending physician.  Recommendations may be updated based on patient status, additional functional  criteria and insurance authorization.  Follow up Recommendations Other (comment) (tba)      Assistance Recommended at Discharge    Functional Status Assessment Patient has had a recent decline in their functional status and demonstrates the ability to make significant improvements in function in a reasonable and predictable amount of time.  Frequency and Duration min 2x/week  2 weeks       Prognosis        Swallow Study   General Date of Onset: 07/23/22 HPI: 72 y.o. female presented to ED via EMS with AMS, seizure. PMHx hypertension, hyperlipidemia, STEMI, CABG, tobacco use, seizures, 4 strokes with most recent last month (bedside swallow eval with recs for dysphagia 2/chopped, thin liquids). 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 Currently, patient on long-term EEG. Type of Study: Bedside Swallow Evaluation Previous Swallow Assessment: see HPI Diet Prior to this Study: NPO Temperature Spikes Noted: No Respiratory Status: Room air History of Recent Intubation: No Behavior/Cognition: Lethargic/Drowsy Oral Cavity Assessment: Dried secretions Oral Care Completed by SLP: Recent completion by staff Oral Cavity - Dentition: Missing dentition Self-Feeding Abilities: Total assist Patient Positioning: Upright in bed Baseline Vocal Quality: Low vocal intensity Volitional Cough: Cognitively unable to elicit Volitional Swallow: Unable to elicit    Oral/Motor/Sensory Function Overall Oral Motor/Sensory Function: Generalized oral weakness (tba)   Ice Chips Ice chips: Impaired Presentation: Spoon Oral Phase Impairments: Reduced labial seal;Poor awareness of bolus Oral Phase Functional Implications: Oral holding Pharyngeal Phase Impairments: Suspected delayed Swallow;Cough - Immediate   Thin Liquid Thin Liquid: Impaired Presentation: Spoon Oral Phase Impairments: Reduced labial seal;Poor  awareness of bolus Oral Phase Functional Implications: Oral holding Pharyngeal  Phase Impairments: Suspected delayed Swallow;Cough - Immediate    Nectar Thick Nectar Thick Liquid: Not tested   Honey Thick Honey Thick Liquid: Not tested   Puree Puree: Not tested   Solid     Solid: Not tested      Blenda Mounts Laurice 07/24/2022,12:15 PM   Marchelle Folks L. Samson Frederic, MA CCC/SLP Clinical Specialist - Acute Care SLP Acute Rehabilitation Services Office number [phone]

## 2022-07-24 NOTE — Progress Notes (Signed)
PT Cancellation Note  Patient Details Name: [name] [name] MRN: [id number] DOB: July 06, 1950   Cancelled Treatment:    Reason Eval/Treat Not Completed: Active bedrest order  Patient currently on bedrest until 6/22 1511. Will ask nursing to reach out to therapies if order is discontinued before then.    Jerolyn Center, PT Acute Rehabilitation Services  Office [phone]  Zena Amos 07/24/2022, 7:26 AM

## 2022-07-24 NOTE — Evaluation (Signed)
Physical Therapy Evaluation Patient Details Name: [name] [name] MRN: [id number] DOB: Mar 11, 1950 Today's Date: 07/24/2022  History of Present Illness  72 y.o. female who presents 07/23/22 after being found in the fetal position with rhythmic jerking movements of the bilateral upper extremities. 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 PTA patient was (per chart from 5/24 admission) ambulating household distances with no device. Pt currently requires total assist for all mobility with pt unable to assist due to lethargy +/- aphasia. Followed no simple commands. Will initiate trial of PT at 2x/wk for 2 weeks to assess if pt can benefit from PT to address problems listed below. If pt becomes able to participate, will consider if post-acute therapies are appropriate.  Will continue to follow acutely to maximize functional mobility independence and safety.          Recommendations for follow up therapy are one component of a multi-disciplinary discharge planning process, led by the attending physician.  Recommendations may be updated based on patient status, additional functional criteria and insurance authorization.  Follow Up Recommendations Can patient physically be transported by private vehicle: No     Assistance Recommended at Discharge Frequent or constant Supervision/Assistance  Patient can return home with the following  A lot of help with bathing/dressing/bathroom;Assistance with feeding;Direct supervision/assist for medications management;Direct supervision/assist for financial management;Assist for transportation;Help with stairs or ramp for entrance    Equipment Recommendations None recommended by PT   Recommendations for Other Services       Functional Status Assessment Patient has had a recent decline in their functional status and demonstrates the ability to make significant improvements in function in a reasonable and predictable amount of time.     Precautions / Restrictions Precautions Precautions: Fall      Mobility  Bed Mobility Overal bed mobility: Needs Assistance Bed Mobility: Rolling Rolling: Total assist         General bed mobility comments: pt unable to participate due to decr cognition plus ?global aphasia per RN    Transfers                        Ambulation/Gait                  Stairs            Wheelchair Mobility    Modified Rankin (Stroke Patients Only) Modified Rankin (Stroke Patients Only) Pre-Morbid Rankin Score: Moderate disability Modified Rankin: Severe disability     Balance                                             Pertinent Vitals/Pain Pain Assessment Facial Expression: Relaxed, neutral Body Movements: Absence of movements Muscle Tension: Relaxed Compliance with ventilator (intubated pts.): N/A Vocalization (extubated pts.): N/A CPOT Total: 0    Home Living Family/patient expects to be discharged to:: Other (Comment)                   Additional Comments: lives in a motel with her boyfriend; per chart, sister trying to get her into an ALF    Prior  Function Prior Level of Function : History of Falls (last six months)             Mobility Comments: per 05/24 evaluation was walking without a device ADLs Comments: per 05/24 evaluation, modified independent with ADLs and boyfriend assists with some IADLs     Hand Dominance   Dominant Hand: Right    Extremity/Trunk Assessment   Upper Extremity Assessment Upper Extremity Assessment: Difficult to assess due to impaired cognition (passively extended bil elbows to full extension, PROM bil hands with R 5th finger  contracted in flexion; spontaneously moving Rt hand, elbow; LUE only noted shoulder elevation when coughing)    Lower Extremity Assessment Lower Extremity Assessment: Difficult to assess due to impaired cognition (PROM bil; pt spontaneously flexing hips/knees)    Cervical / Trunk Assessment Cervical / Trunk Assessment: Other exceptions Cervical / Trunk Exceptions: head remains rotated to her left; PROM only able to achieve midline and used towel/pillow to support her head in more neutral position  Communication   Communication: Receptive difficulties;Expressive difficulties  Cognition Arousal/Alertness: Lethargic (arouses, opens eyes with verbal and tactile stimulation) Behavior During Therapy: Flat affect Overall Cognitive Status: Difficult to assess                                 General Comments: followed no commands        General Comments      Exercises Other Exercises Other Exercises: PROM x 4 extremities   Assessment/Plan    PT Assessment Patient needs continued PT services  PT Problem List Decreased strength;Decreased activity tolerance;Decreased balance;Decreased mobility;Decreased cognition;Decreased knowledge of use of DME       PT Treatment Interventions DME instruction;Gait training;Functional mobility training;Therapeutic activities;Therapeutic exercise;Balance training;Neuromuscular re-education;Cognitive remediation;Patient/family education    PT Goals (Current goals can be found in the Care Plan section)  Acute Rehab PT Goals Patient Stated Goal: pt unable PT Goal Formulation: Patient unable to participate in goal setting Time For Goal Achievement: 08/07/22 Potential to Achieve Goals: Poor    Frequency Min 2X/week (trial)     Co-evaluation               AM-PAC PT "6 Clicks" Mobility  Outcome Measure Help needed turning from your back to your side while in a flat bed without using bedrails?: Total Help needed moving from lying  on your back to sitting on the side of a flat bed without using bedrails?: Total Help needed moving to and from a bed to a chair (including a wheelchair)?: Total Help needed standing up from a chair using your arms (e.g., wheelchair or bedside chair)?: Total Help needed to walk in hospital room?: Total Help needed climbing 3-5 steps with a railing? : Total 6 Click Score: 6    End of Session   Activity Tolerance: Patient limited by lethargy Patient left: in bed;with call bell/phone within reach;with SCD's reapplied Nurse Communication: Other (comment) (lack of following commands) PT Visit Diagnosis: Difficulty in walking, not elsewhere classified (R26.2);Other symptoms and signs involving the nervous system (R29.898)    Time: 4034-7425 PT Time Calculation (min) (ACUTE ONLY): 15 min   Charges:   PT Evaluation $PT Eval Low Complexity: 1 Low           Jerolyn Center, PT Acute Rehabilitation Services  Office [phone]   Zena Amos 07/24/2022, 2:43 PM

## 2022-07-24 NOTE — Progress Notes (Addendum)
STROKE TEAM PROGRESS NOTE   INTERVAL HISTORY No family is at the bedside but did call and speak with sister, [name], over the phone.   Admitted 6/21 after being found unresponsive with rhythmic movements of BUE, given Versed by EMS. 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  Patient is on Eliquis, with questionable medication compliance per family, so no reversal ordered. Patient had a stroke last month (her 4th), with decreased ability to complete ADLs since.   L droop, R gaze, L neglect, wd LLE/RLE, increased tone RUE. No vocal output, only moans.  Vitals:   07/24/22 0615 07/24/22 0645 07/24/22 0700 07/24/22 0715  BP: (!) 140/93 136/74 (!) 147/93 (!) 147/85  Pulse: 89 89 83 65  Resp: (!) 26 (!) 32 (!) 24 (!) 21  Temp:   (!) 97.1 F (36.2 C)   TempSrc:   Axillary   SpO2: 100% 100% 100% 100%   CBC:  Recent Labs  Lab 07/23/22 1202 07/24/22 0453  WBC 6.5 7.6  NEUTROABS 5.0  --   HGB 14.8 14.1  HCT 46.0 42.8  MCV 94.3 93.7  PLT 213 217   Basic Metabolic Panel:  Recent Labs  Lab 07/23/22 1649 07/24/22 0453  NA 137 137  K 4.5 3.7  CL 107 107  CO2 18* 18*  GLUCOSE 88 86  BUN 15 12  CREATININE 0.94 0.88  CALCIUM 9.2 8.9  MG 1.8  --    Lipid Panel:  Recent Labs  Lab 07/24/22 0453  CHOL 136  TRIG 304*  HDL 51  CHOLHDL 2.7  VLDL 61*  LDLCALC 24   HgbA1c:  Recent Labs  Lab 07/24/22 0453  HGBA1C 4.9   Urine Drug Screen: No results for input(s): "LABOPIA", "COCAINSCRNUR", "LABBENZ", "AMPHETMU", "THCU", "LABBARB" in the last 168 hours.  Alcohol Level  Recent Labs  Lab 07/23/22 1203  ETH <10    IMAGING past 24 hours Overnight EEG with video  Result Date: 07/24/2022 Charlsie Quest, MD     07/24/2022  7:54 AM Patient Name: [name] [name] MRN: [id number] Epilepsy Attending: Charlsie Quest Referring Physician/Provider: Marjorie Smolder, NP Duration: 74/21/2024 1457 to 07/24/2022 0730 Patient history: 72yo F with right BG ICH  getting eeg to evaluate for seizure Level of alertness: Awake, asleep AEDs during EEG study: LEV Technical aspects: This EEG study was done with scalp electrodes positioned according to the 10-20 International system of electrode placement. Electrical activity was reviewed with band pass filter of 1-70Hz , sensitivity of 7 uV/mm, display speed of 74mm/sec with a 60Hz  notched filter applied as appropriate. EEG data were recorded continuously and digitally stored.  Video monitoring was available and reviewed as appropriate. Description: The posterior dominant rhythm consists of 8.5 Hz activity of moderate voltage (25-35 uV) seen predominantly in posterior head regions, symmetric and reactive to eye opening and eye closing. Sleep was characterized by vertex waves, sleep spindles (12 to 14 Hz), maximal frontocentral region. EEG showed continuous 3 to 6 Hz theta-delta slowing in right frontotemporal region. Hyperventilation and photic stimulation were not performed.   ABNORMALITY - Continuous slow, right frontotemporal region IMPRESSION: This study is suggestive of cortical dysfunction arising from right frontotemporal region, likely secondary to underlying ICH. No seizures or epileptiform discharges were seen throughout the recording. Charlsie Quest   MR BRAIN W WO CONTRAST  Result Date: 07/24/2022 CLINICAL DATA:  Hemorrhagic stroke workup EXAM: MRI HEAD WITHOUT AND WITH CONTRAST TECHNIQUE: Multiplanar, multiecho pulse sequences of  the brain and surrounding structures were obtained without and with intravenous contrast. CONTRAST:  4mL GADAVIST GADOBUTROL 1 MMOL/ML IV SOLN COMPARISON:  Head CT from yesterday FINDINGS: Brain: Restricted diffusion in the right MCA distribution affecting frontotemporal cortex and the stratum with superimposed known hemorrhage in the right basal ganglia measuring 15 mm. Small superior left frontal cortex acute infarct. Chronic left occipital and parietal infarcts with milder patchy  involvement along the superior left frontal convexity. Chronic lacunar infarct at the left thalamus. No hydrocephalus, extra-axial collection, or masslike finding. Progressively motion degraded exam. Vascular: Loss of left ICA flow void at the skull base with intracranial reconstitution, known from prior imaging Skull and upper cervical spine: Unremarkable Sinuses/Orbits: No acute finding IMPRESSION: 1. Acute right MCA distribution infarct underlies the petechial hemorrhage in the right putamen. Tiny acute infarct in the superior left frontal lobe. 2. Extensive chronic ischemic injury especially in the left cerebral hemisphere. 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 3. Progressively motion degraded study. Electronically Signed   By: Tiburcio Pea M.D.   On: 07/24/2022 07:03   CT HEAD WO CONTRAST  Result Date: 07/23/2022 CLINICAL DATA:  Intracranial hemorrhage EXAM: CT HEAD WITHOUT CONTRAST TECHNIQUE: Contiguous axial images were obtained from the base of the skull through the vertex without intravenous contrast. RADIATION DOSE REDUCTION: This exam was performed according to the departmental dose-optimization program which includes automated exposure control, adjustment of the mA and/or kV according to patient size and/or use of iterative reconstruction technique. COMPARISON:  None Available. FINDINGS: Brain: Expected evolution of right basal ganglia intraparenchymal hemorrhage. Unchanged posterior left hemisphere encephalomalacia. No midline shift or hydrocephalus. Vascular: No abnormal hyperdensity of the major intracranial arteries or dural venous sinuses. No intracranial atherosclerosis. Skull: The visualized skull base, calvarium and extracranial soft tissues are normal. Sinuses/Orbits: No fluid levels or advanced mucosal thickening of the visualized paranasal sinuses. No mastoid or middle ear effusion. The orbits are normal. IMPRESSION: 1. Expected evolution of right basal ganglia intraparenchymal  hemorrhage. 2. Unchanged posterior left hemisphere encephalomalacia. Electronically Signed   By: Deatra Robinson M.D.   On: 07/23/2022 18:45   DG Pelvis Portable  Result Date: 07/23/2022 CLINICAL DATA:  Screening for metallic foreign body before MRI EXAM: PORTABLE PELVIS 1-2 VIEWS COMPARISON:  None Available. FINDINGS: There is contrast in the urinary bladder from previous CT. No metallic foreign bodies are seen. Degenerative changes are noted in lower lumbar spine. Arterial calcifications are seen. IMPRESSION: No metallic foreign bodies are seen.  Lumbar spondylosis. Electronically Signed   By: Ernie Avena M.D.   On: 07/23/2022 18:38   DG Abd 1 View  Result Date: 07/23/2022 CLINICAL DATA:  Screening for metallic foreign body before MRI EXAM: ABDOMEN - 1 VIEW COMPARISON:  None Available. FINDINGS: There are metallic sutures in the sternum. There is previous coronary bypass surgery. No definite metallic foreign bodies are seen in abdomen and pelvis. There is contrast in the pelvocaliceal systems and urinary bladder from previous CTA. Extensive calcifications are seen in abdominal aorta. There is possible contracted gallbladder filled with stones. There is implantable cardiac monitoring device in left chest wall. IMPRESSION: No definite metallic densities are seen in abdomen or pelvis. Previous cardiac bypass. There is implantable cardiac monitoring device in left chest wall. Arteriosclerosis. Possible contracted gallbladder filled with stones. Electronically Signed   By: Ernie Avena M.D.   On: 07/23/2022 18:37   DG CHEST PORT 1 VIEW  Result Date: 07/23/2022 CLINICAL DATA:  Altered level of consciousness EXAM:  PORTABLE CHEST 1 VIEW COMPARISON:  11/03/2021 FINDINGS: Single frontal view of the chest demonstrates stable postsurgical changes from CABG. Cardiac silhouette is unremarkable. No acute airspace disease, effusion, or pneumothorax. No acute bony abnormalities. Loop recorder overlies left  chest. IMPRESSION: 1. No acute intrathoracic process. Electronically Signed   By: Sharlet Salina M.D.   On: 07/23/2022 17:23   CT Angio Head Neck W WO CM  Result Date: 07/23/2022 CLINICAL DATA:  Neuro deficit, acute, stroke suspected unknown last known well EXAM: CT ANGIOGRAPHY HEAD AND NECK WITH AND WITHOUT CONTRAST TECHNIQUE: Multidetector CT imaging of the head and neck was performed using the standard protocol during bolus administration of intravenous contrast. Multiplanar CT image reconstructions and MIPs were obtained to evaluate the vascular anatomy. Carotid stenosis measurements (when applicable) are obtained utilizing NASCET criteria, using the distal internal carotid diameter as the denominator. RADIATION DOSE REDUCTION: This exam was performed according to the departmental dose-optimization program which includes automated exposure control, adjustment of the mA and/or kV according to patient size and/or use of iterative reconstruction technique. CONTRAST:  75 mL of Omnipaque 350 IV. COMPARISON:  CT head and CTA head/neck Jun 22, 2022. FINDINGS: CT HEAD FINDINGS Brain: Acute 1.1 x 0.9 x 1.2 cm intraparenchymal hemorrhage in the right basal ganglia (estimated volume of 0.6 mL). Possible trace intraventricular extension of hemorrhage within the body of the right lateral ventricle. Multiple remote infarcts in the left MCA and PCA territories. No evidence of acute large vascular territory infarct. No midline shift. No hydrocephalus. Vascular: See below. Skull: No acute fracture. Sinuses/Orbits: Clear visualized sinuses. No acute orbital findings. Other: No mastoid effusions. Review of the MIP images confirms the above findings CTA NECK FINDINGS Aortic arch: Great vessel origins are incompletely imaged. Visualized great vessel origins are patent with calcific atherosclerosis. Right carotid system: Similar dense calcific atherosclerosis involving the common carotid artery, carotid bifurcation and ICA with  approximately 75% stenosis of the cervical ICA. Left carotid system: Similar dense calcified atherosclerosis of the common carotid artery with occlusion of the left cervical ICA. 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 Similar severe stenosis of the left vertebral artery origin. Skeleton: No acute findings on limited assessment. Other neck: No acute findings on limited assessment. Upper chest: Emphysema.  Aortic atherosclerosis. Review of the MIP images confirms the above findings CTA HEAD FINDINGS Anterior circulation: Non-opacified left intracranial ICA, chronic. Poorly opacified in small left MCA and ACA, similar. Right M1 MCAs patent. Similar occlusion of a proximal right M2 MCA branch. Posterior circulation: Poor flow within the right vertebral artery due to above. Left vertebral artery, Venous sinuses: Not well assessed due to arterial timing. Review of the MIP images confirms the above findings IMPRESSION: CT head: 1. Acute 1.1 x 0.9 x 1.2 cm intraparenchymal hemorrhage in the right basal ganglia. 2. Possible trace intraventricular extension of hemorrhage within the body of the right lateral ventricle. 3. Remote left MCA and PCA territory infarcts. No definite evidence of new/interval acute large vascular territory infarct; however, MRI could provide more sensitive evaluation if clinically warranted. CTA: 1. Similar occlusion of a right proximal M2 MCA branch. 2. Similar occlusion of the left ICA in the neck with poor opacification of small left MCA and ACA vessels. 3. Similar occluded right vertebral artery. 4. Similar severe stenosis of the left vertebral artery origin. 5. Aortic Atherosclerosis (ICD10-I70.0) and Emphysema (ICD10-J43.9). Findings discussed with Patrice Paradise, PA via telephone at 1:24 p.m. Electronically Signed  By: Feliberto Harts M.D.   On: 07/23/2022 13:28    PHYSICAL EXAM Physical Exam  Constitutional: Cachetic, ill-appearing  patient in no acute distress Eyes: No scleral injection HENT: No OP obstruction, end-tidal CO2 monitor in place Head: Normocephalic.  Cardiovascular: Normal rate and regular rhythm.  Respiratory: Effort normal and regular on supplemental O2 GI: Soft.  No distension. There is no tenderness.  Skin: WDI MSK: Diffuse muscle wasting   Neuro: Mental Status: Neuro - eyes closed, not open on voice, nonverbal and not follow commands.  With forced eye opening, right gaze preference, intermittently cross midline.  Not blinking to visual threat on the left.  Left facial droop. Tongue protrusion not cooperative.  Left upper extremity no movement on pain stimulation but mildly increased muscle tone.  Right upper extremity resistant to be extended, increased muscle tone.  Bilateral lower extremity residual to painful stimuli, in flexed position, increased muscle tone. Sensation, coordination and gait not tested.   ASSESSMENT/PLAN [name] is a 72 y.o. female with history of HTN, HLD, STEMI, CABG, tobacco use, seizures, 4 strokes with most recent last month, Afib on Eliquis who presented 6/21 AM after being found with rhythmic jerking movements of the bilateral upper extremities.   Stroke with hemorrhagic conversion - right MCA moderate and left frontal punctate infarcts with right BG hemorrhagic conversion, likely due to A-fib even on Eliquis CT head Acute 1.1 x 0.9 x 1.2 cm intraparenchymal hemorrhage in the right basal ganglia. Possible trace intraventricular extension of hemorrhage of the right lateral ventricle. Remote left MCA and PCA territory infarct CTA head & neck Similar occlusion of a right proximal M2 MCA branch. Similar occlusion of the left ICA in the neck with poor opacification of small left MCA and ACA vessels. Similar occluded right vertebral artery. Similar severe stenosis of the left vertebral artery origin Repeat CT 6/21: Stable hematoma MRI  Acute right MCA distribution infarct  underlies the petechial hemorrhage in the right putamen. Tiny acute infarct in the superior left frontal lobe. 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 2D Echo 06/23/22 EF 40-45% LDL 24 HgbA1c 4.9 UDS pending VTE prophylaxis - SCDs Eliquis (apixaban) daily prior to admission, now on No antithrombotic due to ICH Therapy recommendations:  pending Disposition:  pending  Seizure Presented with unresponsiveness with bilateral upper extremity jerking Received Versed for seizure activity EEG 6/21: suggestive of cortical dysfunction, likely secondary to underlying ICH.  Continue LTM EEG for another 24 hours. If negative, will discontinue.  Continue Keppra 500mg  BID  History of stroke 08/2012 had left MCA stroke, put on loop recorder 2015 found to have A-fib on loop recorder, started on Eliquis 03/2017 admitted for difficulty speaking.  CT no acute abnormality.  CTA head and neck right ICA 80% stenosis, left VA origin severe stenosis and right VA occlusion, as well as significant intracranial stenosis.  Carotid Doppler negative.  MRI showed left MCA infarct and old large left MCA and PCA infarcts.  LDL 53, A1c 5.6.  2D echo EF normal range.  Continue on Eliquis given noncompliance PTA.  Has residual mild expressive aphasia 06/2022 again admitted for worsening aphasia.  CT head and neck found new left ICA occlusion.  Right ICA 75% stenosis, unchanged vertebral arteries as above.  MRI showed left MCA small high convexity infarct.  EF 40 to 45%.  Continue on Eliquis.  PAF Found in 2015 by loop recorder On Eliquis PTA Currently AC on hold due  to ICH Rate controlled  Hypertension Home meds:  Toprol-XL 25mg  On low-dose Cleviprex Resume home BP meds once p.o. access BP goal < 160 Long-term BP goal normotensive  Hyperlipidemia Home meds:  Lipitor 80mg , Zetia 10mg  LDL 24, goal < 70 Resume statin once p.o. access Consider restarting statin at discharge  Dysphagia Did not  pass swallow Currently n.p.o. IV fluid  Tobacco abuse Current smoker Smoking cessation counseling will be provided  Other Stroke Risk Factors Advanced Age >/= 28  CAD/STEMI status post CABG Family hx stroke (brother)  Other Active Problems ? Medication non-compliance Dementia - Aricept home medication, on hold due to no p.o. access  Hospital day # 1   Pt seen by Neuro NP/APP and later by MD. Note/plan to be edited by MD as needed.    Lynnae January, DNP, AGACNP-BC [facility] Please use AMION for contact information & EPIC for messaging.  ATTENDING NOTE: I reviewed above note and agree with the assessment and plan. Pt was seen and examined.   Patient lying bed, eyes closed, not open on voice, nonverbal and not follow commands.  With forced eye opening, right gaze preference, intermittently cross midline.  Not blinking to visual threat on the left.  Left facial droop. Tongue protrusion not cooperative.  Left upper extremity no movement on pain stimulation but mildly increased muscle tone.  Right upper extremity resistant to be extended, increased muscle tone.  Bilateral lower extremity residual to painful stimuli, in flexed position, increased muscle tone. Sensation, coordination and gait not tested.   Patient recurrent stroke, now much worse than baseline given right MCA stroke with hemorrhagic transformation at right BG.  Did not pass swallow, currently n.p.o., on IV fluid.  Continue Keppra for seizure prevention.  LTM no seizure, consider to discontinue tomorrow if remains seizure-free.  No antiplatelet or AC given ICH.  Repeat CT in a.m. PT and OT pending  For detailed assessment and plan, please refer to above/below as I have made changes wherever appropriate.   Marvel Plan, MD PhD Stroke Neurology 07/24/2022 9:40 PM  This patient is critically ill due to right MCA stroke with hemorrhagic transformation, PAF, seizure on presentation, dysphagia and at significant  risk of neurological worsening, death form status epilepticus, recurrent stroke, cerebral edema, brain herniation, heart failure, aspiration. This patient's care requires constant monitoring of vital signs, hemodynamics, respiratory and cardiac monitoring, review of multiple databases, neurological assessment, discussion with family, other specialists and medical decision making of high complexity. I spent 45 minutes of neurocritical care time in the care of this patient.    To contact Stroke Continuity provider, please refer to [email or web address]. After hours, contact General Neurology

## 2022-07-25 ENCOUNTER — Inpatient Hospital Stay (HOSPITAL_COMMUNITY): Payer: 59

## 2022-07-25 DIAGNOSIS — I6389 Other cerebral infarction: Secondary | ICD-10-CM | POA: Diagnosis not present

## 2022-07-25 DIAGNOSIS — R569 Unspecified convulsions: Secondary | ICD-10-CM | POA: Diagnosis not present

## 2022-07-25 MED ORDER — HYDRALAZINE HCL 20 MG/ML IJ SOLN
5.0000 mg | INTRAMUSCULAR | Status: DC | PRN
  Administered 2022-07-25 – 2022-07-26 (×3): 10 mg via INTRAVENOUS
  Filled 2022-07-25 (×2): qty 1

## 2022-07-25 MED ORDER — HEPARIN SODIUM (PORCINE) 5000 UNIT/ML IJ SOLN
5000.0000 [IU] | Freq: Two times a day (BID) | INTRAMUSCULAR | Status: DC
  Administered 2022-07-25 – 2022-08-06 (×24): 5000 [IU] via SUBCUTANEOUS
  Filled 2022-07-25 (×24): qty 1

## 2022-07-25 MED ORDER — HYDRALAZINE HCL 20 MG/ML IJ SOLN
5.0000 mg | INTRAMUSCULAR | Status: DC | PRN
  Administered 2022-07-25: 5 mg via INTRAVENOUS
  Filled 2022-07-25: qty 1

## 2022-07-25 MED ORDER — SODIUM CHLORIDE 0.9 % IV SOLN: INTRAVENOUS | Status: DC

## 2022-07-25 MED ORDER — LABETALOL HCL 5 MG/ML IV SOLN
5.0000 mg | INTRAVENOUS | Status: DC | PRN
  Administered 2022-07-25: 10 mg via INTRAVENOUS
  Filled 2022-07-25: qty 4

## 2022-07-25 MED ORDER — LABETALOL HCL 5 MG/ML IV SOLN
5.0000 mg | INTRAVENOUS | Status: DC
  Administered 2022-07-25: 5 mg via INTRAVENOUS
  Filled 2022-07-25: qty 4

## 2022-07-25 NOTE — Progress Notes (Addendum)
STROKE TEAM PROGRESS NOTE   INTERVAL HISTORY  Boyfriend at bedside.   No seizures seen on long-term EEG reads.  Will DC LTM EEG today. Blood pressure medications added.  Weaning Cleviprex as able.  Some improvement seen in neuroexam as patient seems to be more awake and interactive today.  Still nonverbal but gaze preference has improved as she is moving eyes bilaterally.  Vitals:   07/25/22 0630 07/25/22 0645 07/25/22 0700 07/25/22 0715  BP: (!) 144/83 (!) 131/105 132/81 (!) 140/83  Pulse: (!) 103 100 92 98  Resp: (!) 24 (!) 29 (!) 25 (!) 21  Temp:      TempSrc:      SpO2: 100% 100% 100% 100%   CBC:  Recent Labs  Lab 07/23/22 1202 07/24/22 0453  WBC 6.5 7.6  NEUTROABS 5.0  --   HGB 14.8 14.1  HCT 46.0 42.8  MCV 94.3 93.7  PLT 213 217    Basic Metabolic Panel:  Recent Labs  Lab 07/23/22 1649 07/24/22 0453  NA 137 137  K 4.5 3.7  CL 107 107  CO2 18* 18*  GLUCOSE 88 86  BUN 15 12  CREATININE 0.94 0.88  CALCIUM 9.2 8.9  MG 1.8  --     Lipid Panel:  Recent Labs  Lab 07/24/22 0453  CHOL 136  TRIG 304*  HDL 51  CHOLHDL 2.7  VLDL 61*  LDLCALC 24    HgbA1c:  Recent Labs  Lab 07/24/22 0453  HGBA1C 4.9    Urine Drug Screen: No results for input(s): "LABOPIA", "COCAINSCRNUR", "LABBENZ", "AMPHETMU", "THCU", "LABBARB" in the last 168 hours.  Alcohol Level  Recent Labs  Lab 07/23/22 1203  ETH <10     IMAGING past 24 hours CT HEAD WO CONTRAST ( )  Result Date: 07/25/2022 CLINICAL DATA:  Stroke follow-up EXAM: CT HEAD WITHOUT CONTRAST TECHNIQUE: Contiguous axial images were obtained from the base of the skull through the vertex without intravenous contrast. RADIATION DOSE REDUCTION: This exam was performed according to the departmental dose-optimization program which includes automated exposure control, adjustment of the mA and/or kV according to patient size and/or use of iterative reconstruction technique. COMPARISON:  Brain MRI from yesterday and  head CT from 2 days ago FINDINGS: Brain: Cytotoxic edema in the right MCA distribution from acute infarcts, extent better assessed on recent MRI, most confluent at the insula and opercula. No evidence of progression. Hemorrhage at the right putamen measuring 13 mm is unchanged. Large remote left MCA and PCA distribution infarcts affecting cortex. No hydrocephalus or shift. Vascular: No acute finding Skull: Unremarkable Sinuses/Orbits: Gaze to the right. IMPRESSION: 1. No progression of right MCA distribution infarct with putamen hemorrhage when compared to brain MRI yesterday. 2. Large remote left frontal parietal and occipital cortex infarcts. Electronically Signed   By: Tiburcio Pea M.D.   On: 07/25/2022 06:22    PHYSICAL EXAM Physical Exam  Constitutional: Cachetic, ill-appearing patient in no acute distress Eyes: No scleral injection HENT: No OP obstruction, end-tidal CO2 monitor in place Head: Normocephalic.  Cardiovascular: Normal rate and regular rhythm.  Respiratory: Effort normal and regular on supplemental O2 GI: Soft.  No distension. There is no tenderness.  Skin: WDI MSK: Diffuse muscle wasting   Neuro: Patient lying bed, eyes open, more awake alert and interactive today, with good eye contact. Still nonverbal but mourning, not follow commands.   No significant gaze preference today, moving eyes bilaterally.  Blinking to visual threat bilaterally.   Left facial droop. Tongue  protrusion not cooperative.   Left upper extremity slow drift when lifting up.  Right upper extremity resistant to be extended, increased muscle tone, but able to hold against gravity without minimal drift.  Bilateral lower extremity in flexed position, increased muscle tone but mild withdraw to pain equally.  Sensation, coordination and gait not tested.   ASSESSMENT/PLAN [name] is a 72 y.o. female with history of HTN, HLD, STEMI, CABG, tobacco use, seizures, 4 strokes with most recent last  month, Afib on Eliquis who presented 6/21 AM after being found with rhythmic jerking movements of the bilateral upper extremities.   Stroke with hemorrhagic conversion - right MCA moderate and left frontal punctate infarcts with right BG hemorrhagic conversion, likely due to A-fib even on Eliquis CT head Acute 1.1 x 0.9 x 1.2 cm intraparenchymal hemorrhage in the right basal ganglia. Possible trace intraventricular extension of hemorrhage of the right lateral ventricle. Remote left MCA and PCA territory infarct CTA head & neck Similar occlusion of a right proximal M2 MCA branch. Similar occlusion of the left ICA in the neck with poor opacification of small left MCA and ACA vessels. Similar occluded right vertebral artery. Similar severe stenosis of the left vertebral artery origin Repeat CT 6/21: Stable hematoma MRI  Acute right MCA distribution infarct underlies the petechial hemorrhage in the right putamen. Tiny acute infarct in the superior left frontal lobe. 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  2D Echo 06/23/22 EF 40-45% LDL 24 HgbA1c 4.9 UDS pending VTE prophylaxis - SCDs Eliquis (apixaban) daily prior to admission, now on No antithrombotic due to ICH Therapy recommendations: LTAC Disposition:  pending  Seizure Presented with unresponsiveness with bilateral upper extremity jerking Received Versed for seizure activity EEG 6/21: suggestive of cortical dysfunction, likely secondary to underlying ICH.  EEG 6/22: suggestive of cortical dysfunction arising from right frontotemporal region, likely secondary to underlying ICH. No seizures.  LTM EEG discontinued Continue Keppra 500mg  BID  History of stroke 08/2012 had left MCA stroke, put on loop recorder 2015 found to have A-fib on loop recorder, started on Eliquis 03/2017 admitted for difficulty speaking.  CT no acute abnormality.  CTA head and neck right ICA 80% stenosis, left VA origin severe stenosis  and right VA occlusion, as well as significant intracranial stenosis.  Carotid Doppler negative.  MRI showed left MCA infarct and old large left MCA and PCA infarcts.  LDL 53, A1c 5.6.  2D echo EF normal range.  Continue on Eliquis given noncompliance PTA.  Has residual mild expressive aphasia 06/2022 again admitted for worsening aphasia.  CT head and neck found new left ICA occlusion.  Right ICA 75% stenosis, unchanged vertebral arteries as above.  MRI showed left MCA small high convexity infarct.  EF 40 to 45%.  Continue on Eliquis.  PAF Found in 2015 by loop recorder On Eliquis PTA Currently AC on hold due to ICH Rate controlled  Hypertension Home meds:  Toprol-XL 25mg  On low-dose Cleviprex, wean as able PRN Labetalol and Hydralazine IVP added Resume home BP meds once p.o. access/Coretrak BP goal < 160 Long-term BP goal normotensive  Hyperlipidemia Home meds:  Lipitor 80mg , Zetia 10mg  LDL 24, goal < 70 Resume statin once p.o. access Consider restarting statin at discharge  Dysphagia Did not pass swallow Currently n.p.o. IV fluid Coretrak ordered  Tobacco abuse Current smoker Smoking cessation counseling will be provided  Other Stroke Risk Factors Advanced Age >/= 65  CAD/STEMI status  post CABG Family hx stroke (brother)  Other Active Problems ? Medication non-compliance Dementia - Aricept home medication, on hold due to no p.o. access  Hospital day # 2   Pt seen by Neuro NP/APP and later by MD. Note/plan to be edited by MD as needed.    Lynnae January, DNP, AGACNP-BC [facility] Please use AMION for contact information & EPIC for messaging.  ATTENDING NOTE: I reviewed above note and agree with the assessment and plan. Pt was seen and examined.   Pt boyfriend is at the bedside. Patient lying bed, eyes open, more awake alert and interactive today, with good eye contact. Still nonverbal but mourning, not follow commands.  No significant gaze preference  today, moving eyes bilaterally.  Blinking to visual threat bilaterally.  Left facial droop. Tongue protrusion not cooperative.  Left upper extremity slow drift when lifting up.  Right upper extremity resistant to be extended, increased muscle tone, but able to hold against gravity without minimal drift.  Bilateral lower extremity in flexed position, increased muscle tone but mild withdraw to pain equally. Sensation, coordination and gait not tested.   Pt BP still on the high end, however no PO access for now, will continue IV PRN. Will add po BP meds once po access. Continue keppra, will consider cortrak placement in am. I had long discussion with boyfriend at bedside, updated pt current condition, treatment plan and potential prognosis, and answered all the questions. He expressed understanding and appreciation.   For detailed assessment and plan, please refer to above/below as I have made changes wherever appropriate.   Marvel Plan, MD PhD Stroke Neurology 07/25/2022 7:12 PM  This patient is critically ill due to right MCA stroke with hemorrhagic transformation, PAF, seizure on presentation, dysphagia and at significant risk of neurological worsening, death form status epilepticus, recurrent stroke, cerebral edema, brain herniation, heart failure, aspiration. This patient's care requires constant monitoring of vital signs, hemodynamics, respiratory and cardiac monitoring, review of multiple databases, neurological assessment, discussion with family, other specialists and medical decision making of high complexity. I spent 35 minutes of neurocritical care time in the care of this patient.    To contact Stroke Continuity provider, please refer to [email or web address]. After hours, contact General Neurology

## 2022-07-25 NOTE — Procedures (Signed)
Patient Name: [name] [name]  MRN: [id number]  Epilepsy Attending: Charlsie Quest  Referring Physician/Provider: Marjorie Smolder, NP  Duration: 07/24/2022 1457 to 07/25/2022 1914   Patient history: 72yo F with right BG ICH getting eeg to evaluate for seizure   Level of alertness: Awake, asleep   AEDs during EEG study: LEV   Technical aspects: This EEG study was done with scalp electrodes positioned according to the 10-20 International system of electrode placement. Electrical activity was reviewed with band pass filter of 1-70Hz , sensitivity of 7 uV/mm, display speed of 27mm/sec with a 60Hz  notched filter applied as appropriate. EEG data were recorded continuously and digitally stored.  Video monitoring was available and reviewed as appropriate.   Description: The posterior dominant rhythm consists of 8.5 Hz activity of moderate voltage (25-35 uV) seen predominantly in posterior head regions, symmetric and reactive to eye opening and eye closing. Sleep was characterized by vertex waves, sleep spindles (12 to 14 Hz), maximal frontocentral region. EEG showed continuous 3 to 6 Hz theta-delta slowing in right frontotemporal region. Hyperventilation and photic stimulation were not performed.      ABNORMALITY - Continuous slow, right frontotemporal region    IMPRESSION: This study is suggestive of cortical dysfunction arising from right frontotemporal region, likely secondary to underlying ICH. No seizures or epileptiform discharges were seen throughout the recording.   Leonilda Cozby Annabelle Harman

## 2022-07-25 NOTE — Progress Notes (Signed)
SLP Cancellation Note  Patient Details Name: [name] [name] MRN: [id number] DOB: [birth year]   Cancelled treatment:       Reason Eval/Treat Not Completed: Fatigue/lethargy limiting ability to participate (Pt's case discussed with Marissa, RN who advised that pt's alertness is similar to that observed yesterday. It was agreed that SLP will follow up on subsequent date.)  [name] [name] I. Vear Clock, MS, CCC-SLP Neuro Diagnostic Specialist  Acute Rehabilitation Services Office number: [phone]  Scheryl Marten 07/25/2022, 2:44 PM

## 2022-07-25 NOTE — Progress Notes (Signed)
EEG D/C'd. No skin breakdown noted. [facility] notified.  

## 2022-07-26 ENCOUNTER — Other Ambulatory Visit (HOSPITAL_COMMUNITY): Payer: Self-pay

## 2022-07-26 ENCOUNTER — Inpatient Hospital Stay (HOSPITAL_COMMUNITY): Payer: 59

## 2022-07-26 DIAGNOSIS — R569 Unspecified convulsions: Secondary | ICD-10-CM | POA: Diagnosis not present

## 2022-07-26 DIAGNOSIS — I6389 Other cerebral infarction: Secondary | ICD-10-CM | POA: Diagnosis not present

## 2022-07-26 LAB — BASIC METABOLIC PANEL
Anion gap: 16 — ABNORMAL HIGH (ref 5–15)
BUN: 13 mg/dL (ref 8–23)
CO2: 17 mmol/L — ABNORMAL LOW (ref 22–32)
Calcium: 9 mg/dL (ref 8.9–10.3)
Chloride: 106 mmol/L (ref 98–111)
Creatinine, Ser: 0.73 mg/dL (ref 0.44–1.00)
GFR, Estimated: >60 mL/min (ref >60)
Glucose, Bld: 95 mg/dL (ref 70–99)
Potassium: 3.6 mmol/L (ref 3.5–5.1)
Sodium: 139 mmol/L (ref 135–145)

## 2022-07-26 LAB — CBC WITH DIFFERENTIAL/PLATELET
Abs Immature Granulocytes: 0.02 10*3/uL (ref 0.00–0.07)
Basophils Absolute: 0.1 10*3/uL (ref 0.0–0.1)
Basophils Relative: 1 %
Eosinophils Absolute: 0 10*3/uL (ref 0.0–0.5)
Eosinophils Relative: 0 %
HCT: 40 % (ref 36.0–46.0)
Hemoglobin: 13.1 g/dL (ref 12.0–15.0)
Immature Granulocytes: 0 %
Lymphocytes Relative: 15 %
Lymphs Abs: 1.2 10*3/uL (ref 0.7–4.0)
MCH: 31 pg (ref 26.0–34.0)
MCHC: 32.8 g/dL (ref 30.0–36.0)
MCV: 94.6 fL (ref 80.0–100.0)
Monocytes Absolute: 0.9 10*3/uL (ref 0.1–1.0)
Monocytes Relative: 12 %
Neutro Abs: 5.7 10*3/uL (ref 1.7–7.7)
Neutrophils Relative %: 72 %
Platelets: 193 10*3/uL (ref 150–400)
RBC: 4.23 MIL/uL (ref 3.87–5.11)
RDW: 13.9 % (ref 11.5–15.5)
WBC: 7.9 10*3/uL (ref 4.0–10.5)
nRBC: 0 % (ref 0.0–0.2)

## 2022-07-26 LAB — MAGNESIUM
Magnesium: 1.6 mg/dL — ABNORMAL LOW (ref 1.7–2.4)
Magnesium: 1.6 mg/dL — ABNORMAL LOW (ref 1.7–2.4)

## 2022-07-26 LAB — PHOSPHORUS
Phosphorus: 3 mg/dL (ref 2.5–4.6)
Phosphorus: 2.6 mg/dL (ref 2.5–4.6)

## 2022-07-26 MED ORDER — PROSOURCE TF20 ENFIT COMPATIBL EN LIQD
60.0000 mL | Freq: Every day | ENTERAL | Status: DC
  Administered 2022-07-26 – 2022-08-04 (×10): 60 mL
  Filled 2022-07-26 (×10): qty 60

## 2022-07-26 MED ORDER — METOPROLOL TARTRATE 25 MG PO TABS
25.0000 mg | ORAL_TABLET | Freq: Two times a day (BID) | ORAL | Status: DC
  Administered 2022-07-26 – 2022-08-06 (×22): 25 mg
  Filled 2022-07-26 (×22): qty 1

## 2022-07-26 MED ORDER — THIAMINE MONONITRATE 100 MG PO TABS
100.0000 mg | ORAL_TABLET | Freq: Every day | ORAL | Status: AC
  Administered 2022-07-26 – 2022-08-01 (×7): 100 mg
  Filled 2022-07-26 (×7): qty 1

## 2022-07-26 MED ORDER — ATORVASTATIN CALCIUM 40 MG PO TABS
40.0000 mg | ORAL_TABLET | Freq: Every day | ORAL | Status: DC
  Administered 2022-07-26 – 2022-08-06 (×12): 40 mg
  Filled 2022-07-26 (×12): qty 1

## 2022-07-26 MED ORDER — OSMOLITE 1.2 CAL PO LIQD
1000.0000 mL | ORAL | Status: DC
  Administered 2022-07-26 – 2022-07-28 (×2): 1000 mL

## 2022-07-26 MED ORDER — ADULT MULTIVITAMIN W/MINERALS CH
1.0000 | ORAL_TABLET | Freq: Every day | ORAL | Status: DC
  Administered 2022-07-26 – 2022-08-06 (×12): 1
  Filled 2022-07-26 (×12): qty 1

## 2022-07-26 MED ORDER — OSMOLITE 1.5 CAL PO LIQD: 1000.0000 mL | ORAL | Status: DC

## 2022-07-26 MED ORDER — LEVETIRACETAM 500 MG PO TABS
500.0000 mg | ORAL_TABLET | Freq: Two times a day (BID) | ORAL | Status: DC
  Administered 2022-07-26 – 2022-08-06 (×22): 500 mg
  Filled 2022-07-26 (×22): qty 1

## 2022-07-26 NOTE — TOC Benefit Eligibility Note (Signed)
Pharmacy Patient Advocate Encounter  Insurance verification completed.    The patient is insured through [employer] Medicare Part D  Ran test claim for Eliquis 5 mg and the current 30 day co-pay is $0.00.   This test claim was processed through [facility]- copay amounts may vary at other pharmacies due to pharmacy/plan contracts, or as the patient moves through the different stages of their insurance plan.    Roland Earl, CPHT Pharmacy Patient Advocate Specialist [facility] Patient Advocate Team Direct Number: [phone]  Fax: [phone]

## 2022-07-26 NOTE — Progress Notes (Signed)
OT NOTE  Pt is high risk for skin break down due to inability to reposition. Recommendation for air filled mattress overlay or ICU hillrom bed. Pt needs to be turned frequently and monitored for skin on skin pressure so pillows are importance preventive.    Timmothy Euler, OTR/L  Acute Rehabilitation Services Office: [phone] .

## 2022-07-26 NOTE — Evaluation (Signed)
Occupational Therapy Evaluation Patient Details Name: [name] [name] MRN: [id number] DOB: Jun 24, 1950 Today's Date: 07/26/2022   History of Present Illness 72 y.o. female who presents 07/23/22 after being found in the fetal position with rhythmic jerking movements of the bilateral upper extremities. CT acute IPH in right basal ganglia with possible IVH; long-term EEG ordered; NIHSS=24;   PMH significant of: hypertension, hyperlipidemia, STEMI, CABG, tobacco use, seizures, 4 strokes (including L MCA) and atrial fibrillation on Eliquis   Clinical Impression   PT admitted with CVA. Pt currently with functional limitiations due to the deficits listed below (see OT problem list). Pt at risk for skin break down due to dependence on positioning from staff. Pt prefers a fetal positioning and will need frequent turns. Pt requires dependence for all care at this time. Pt was unable to complete hand over hand wash face due to R UE tone.  Pt will benefit from skilled OT to increase their independence and safety with adls and balance to allow discharge skilled inpatient follow up therapy, <3 hours/day. .       Recommendations for follow up therapy are one component of a multi-disciplinary discharge planning process, led by the attending physician.  Recommendations may be updated based on patient status, additional functional criteria and insurance authorization.   Assistance Recommended at Discharge Frequent or constant Supervision/Assistance  Patient can return home with the following Two people to help with walking and/or transfers;Two people to help with bathing/dressing/bathroom    Functional Status Assessment  Patient has had a recent decline in their functional status and demonstrates the ability to make significant improvements in function in a reasonable and predictable amount of time.  Equipment Recommendations  Wheelchair (measurements OT);Wheelchair cushion (measurements OT);Hospital bed;Other  (comment) (hoyer)    Recommendations for Other Services Speech consult;PT consult;Other (comment) (palliative)     Precautions / Restrictions Precautions Precautions: Fall Precaution Comments: seizure, concerns for pressure wound due to positioning      Mobility Bed Mobility Overal bed mobility: Needs Assistance Bed Mobility: Rolling Rolling: Total assist         General bed mobility comments: pt holding a fetal position and will required two person (A). p    Transfers                   General transfer comment: not appropriate      Balance                                           ADL either performed or assessed with clinical judgement   ADL Overall ADL's : Needs assistance/impaired Eating/Feeding: NPO                                     General ADL Comments: total dependence at this time     Vision   Additional Comments: opening eyes and looking to the right with name call x3 during session     Perception     Praxis      Pertinent Vitals/Pain Pain Assessment Pain Assessment: Faces Faces Pain Scale: Hurts even more Pain Location: facial grimace with knee extension otherwise no pain noted Pain Descriptors / Indicators: Grimacing Pain Intervention(s): Monitored during session, Repositioned     Hand Dominance Right   Extremity/Trunk Assessment Upper Extremity Assessment Upper  Extremity Assessment: Generalized weakness;RUE deficits/detail;LUE deficits/detail RUE Deficits / Details: pt high risk for skin break down due to long nails. pt with rolled towel placed at this time.Pt able to extend digits passively. pt able to activate digits into flexion and minimal extension. arm adducted to the body,. elbow flexion 90 degrees with decreased extension tone present. RUE Sensation: decreased proprioception RUE Coordination: decreased fine motor;decreased gross motor LUE Deficits / Details: PROM full extension shoulder  elbow wrist and hand. pt activated but with no tone present LUE Coordination: decreased fine motor;decreased gross motor   Lower Extremity Assessment Lower Extremity Assessment: Defer to PT evaluation;LLE deficits/detail LLE Deficits / Details: pt with facial grimace with any knee extenion, want to ball into a fetal position. noted to have bandage on medial aspect of the knee - concerns for skin integrity and could benefit from pillows to help with alignment   Cervical / Trunk Assessment Cervical / Trunk Assessment: Other exceptions Cervical / Trunk Exceptions: head remains rotated to her left; PROM only able to achieve midline and used towel/pillow to support her head in more neutral position   Communication Communication Communication: Receptive difficulties;Expressive difficulties   Cognition Arousal/Alertness: Lethargic Behavior During Therapy: Flat affect Overall Cognitive Status: Difficult to assess                                 General Comments: pt followed command to respond but the response was garbled speech. pt expressed Yes and no clearly during session twice. pt not following commands with R hand     General Comments  VSS on RA    Exercises Other Exercises Other Exercises: PROM of BIL UE and BIL LE Other Exercises: focused session on cerval rotation and extension. pt with increaesd midline and tolerance at the end of session   Shoulder Instructions      Home Living Family/patient expects to be discharged to:: Other (Comment)                                 Additional Comments: lives in [place] withe boyfriend per notes/ staff reports. no famiy present at evaluation      Prior Functioning/Environment Prior Level of Function : History of Falls (last six months)             Mobility Comments: per 05/24 evaluation was walking without a device ADLs Comments: per 05/24 evaluation, modified independent with ADLs and boyfriend assists  with some IADLs        OT Problem List: Decreased strength;Decreased range of motion;Decreased activity tolerance;Impaired balance (sitting and/or standing);Decreased cognition;Decreased coordination;Decreased safety awareness;Impaired vision/perception;Decreased knowledge of precautions;Decreased knowledge of use of DME or AE;Impaired tone;Impaired sensation;Impaired UE functional use      OT Treatment/Interventions: Self-care/ADL training;Therapeutic exercise;Neuromuscular education;Energy conservation;DME and/or AE instruction;Manual therapy;Modalities;Splinting;Therapeutic activities;Cognitive remediation/compensation;Visual/perceptual remediation/compensation;Patient/family education;Balance training    OT Goals(Current goals can be found in the care plan section) Acute Rehab OT Goals Patient Stated Goal: none stated by patient OT Goal Formulation: Patient unable to participate in goal setting Time For Goal Achievement: 08/09/22 Potential to Achieve Goals: Good  OT Frequency: Min 2X/week    Co-evaluation              AM-PAC OT "6 Clicks" Daily Activity     Outcome Measure Help from another person eating meals?: Total Help from another person taking care of personal  grooming?: Total Help from another person toileting, which includes using toliet, bedpan, or urinal?: Total Help from another person bathing (including washing, rinsing, drying)?: Total Help from another person to put on and taking off regular upper body clothing?: Total Help from another person to put on and taking off regular lower body clothing?: Total 6 Click Score: 6   End of Session Nurse Communication: Mobility status;Precautions  Activity Tolerance: Patient tolerated treatment well Patient left: in bed;with call bell/phone within reach;with bed alarm set  OT Visit Diagnosis: Muscle weakness (generalized) (M62.81)                Time: 1610-9604 OT Time Calculation (min): 16 min Charges:  OT General  Charges $OT Visit: 1 Visit OT Evaluation $OT Eval Moderate Complexity: 1 Mod   Brynn, OTR/L  Acute Rehabilitation Services Office: [phone] .   Mateo Flow 07/26/2022, 7:18 PM

## 2022-07-26 NOTE — Progress Notes (Signed)
Initial Nutrition Assessment  DOCUMENTATION CODES:   Severe malnutrition in context of chronic illness, Underweight  INTERVENTION:   Initiate tube feeding via Cortrak tube: Osmolite 1.2 at 20 ml/h and increase by 10 ml every 8 hours to goal rate of 50 ml/hr (1200 ml per day) Prosource TF20 60 ml daily  Provides 1520 kcal, 86 gm protein, 973 ml free water daily  Monitor magnesium and phosphorus every 12 hours x 4 occurrences, MD to replete as needed, as pt is at risk for refeeding syndrome given severe malnutrition.  MVI with minerals daily  100 mg thiamine daily x 7 days  NUTRITION DIAGNOSIS:   Severe Malnutrition related to chronic illness as evidenced by severe fat depletion, severe muscle depletion.  GOAL:   Patient will meet greater than or equal to 90% of their needs  MONITOR:   TF tolerance, Labs  REASON FOR ASSESSMENT:   Consult Enteral/tube feeding initiation and management  ASSESSMENT:   Pt with PMH of HTN, HLD, STEMI, CABG, tobacco use, seizures, Afib on Eliquis, and 4 strokes in the past with the most recent last month now admitted with R MCA moderate with L frontal punctate infarcts with R BG hemorrhagic conversion.   Per RD notes from 06/2022 pt lived in hotel with boyfriend prior to taht admission.   Medications reviewed and include: protonix, senokot-s NS @ 50 ml/hr  Labs reviewed:  A1c 4.9 CBG's: 88  NUTRITION - FOCUSED PHYSICAL EXAM:  Flowsheet Row Most Recent Value  Orbital Region Severe depletion  Upper Arm Region Severe depletion  Thoracic and Lumbar Region Severe depletion  Buccal Region Severe depletion  Temple Region Severe depletion  Clavicle Bone Region Severe depletion  Clavicle and Acromion Bone Region Severe depletion  Scapular Bone Region Severe depletion  Dorsal Hand Severe depletion  Patellar Region Severe depletion  Anterior Thigh Region Severe depletion  Posterior Calf Region Severe depletion  Edema (RD Assessment) None   Hair Reviewed  Eyes Reviewed  Mouth Reviewed  [poor dentition, unable to see tongue]  Skin Reviewed  Nails Reviewed       Diet Order:   Diet Order             Diet NPO time specified  Diet effective now                   EDUCATION NEEDS:   Education needs have been addressed  Skin:  Skin Assessment: Reviewed RN Assessment  Last BM:  unknown  Height:   Ht Readings from Last 1 Encounters:  11/04/21 5' (1.524 m)    Weight:   Wt Readings from Last 1 Encounters:  06/22/22 35.6 kg    BMI:  There is no height or weight on file to calculate BMI.  Estimated Nutritional Needs:   Kcal:  1400-1600  Protein:  70-85 grams  Fluid:  >1.5 L/day  Cammy Copa., RD, LDN, CNSC See AMiON for contact information

## 2022-07-26 NOTE — TOC Initial Note (Signed)
Transition of Care [facility]) - Initial/Assessment Note    [name]: [id number] Date of Birth: 11-08-1950  Transition of Care [facility]) CM/SW Contact:    Mearl Latin, LCSW Phone Number: 07/26/2022, 5:39 PM  Clinical Narrative:                 Patient admitted from hotel [facility]). Recent hospital discharge on 5/23 with outpatient therapy at [facility] and transport via her sister. CSW will continue to follow for needs.      Barriers to Discharge: Continued Medical Work up   Patient Goals and CMS Choice            Expected Discharge Plan and Services       Living arrangements for the past 2 months: Hotel/Motel                                      Prior Living Arrangements/Services Living arrangements for the past 2 months: Hotel/Motel Lives with:: Significant Other                   Activities of Daily Living      Permission Sought/Granted                  Emotional Assessment Appearance:: Appears stated age Attitude/Demeanor/Rapport: Unable to Assess Affect (typically observed): Unable to Assess Orientation: :  [facility]) Alcohol / Substance Use: Not Applicable Psych Involvement: No (comment)  Admission diagnosis:  Seizures (HCC) [R56.9] ICH (intracerebral hemorrhage) (HCC) [I61.9] Intraparenchymal hemorrhage of brain (HCC) [I61.9] Patient Active Problem List   Diagnosis Date Noted   ICH (intracerebral hemorrhage) (HCC) 07/23/2022   Stroke (HCC) 06/22/2022   HLD (hyperlipidemia) 06/22/2022   Protein-calorie malnutrition, severe (HCC) 06/22/2022   CAD (coronary artery disease) 06/22/2022   Hypoglycemia 06/22/2022   Heart failure with mildly reduced ejection fraction (HFmrEF) (HCC) 11/05/2021   COVID-19 11/04/2021   Dehydration 11/04/2021   Syncope 11/04/2021   Atrial fibrillation with RVR (HCC) 11/03/2021   Chronic anticoagulation 04/24/2019   Closed displaced fracture of proximal phalanx of right little  finger    Dyslipidemia, goal LDL below 70    History of stroke    Hypokalemia    History of ST elevation myocardial infarction (STEMI)    History of seizure    History of memory loss    PAF (paroxysmal atrial fibrillation) (HCC) 10/19/2013   Constipation 10/04/2013   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 07/25/12 07/28/2012   Essential hypertension 07/21/2012   Tobacco abuse 07/21/2012   PCP:  Preston Fleeting, MD Pharmacy:   [facility] [place], [place] - [address] [place] [address] Phone: [phone] Fax: [phone]  [facility] [address] [place] [address] Phone: [phone] Fax: [phone]     Social Determinants of Health (SDOH) Social History: SDOH Screenings   Tobacco Use: High Risk (07/23/2022)   SDOH Interventions:     Readmission Risk Interventions     No data to display

## 2022-07-26 NOTE — Progress Notes (Addendum)
STROKE TEAM PROGRESS NOTE   INTERVAL HISTORY Some improvement seen in neuroexam as patient seems to be more awake and interactive today.  Still nonverbal but gaze preference has improved as she is moving eyes bilaterally. Cortrak today, Transfer out of ICU  Vitals:   07/26/22 0545 07/26/22 0600 07/26/22 0700 07/26/22 0800  BP:  (!) 143/74 (!) 154/69 (!) 147/73  Pulse: 90 90 83 89  Resp: 16 (!) 26 19 (!) 26  Temp:    97.8 F (36.6 C)  TempSrc:    Oral  SpO2: 100% 100% 100% 100%   CBC:  Recent Labs  Lab 07/23/22 1202 07/24/22 0453 07/26/22 0029  WBC 6.5 7.6 7.9  NEUTROABS 5.0  --  5.7  HGB 14.8 14.1 13.1  HCT 46.0 42.8 40.0  MCV 94.3 93.7 94.6  PLT 213 217 193    Basic Metabolic Panel:  Recent Labs  Lab 07/23/22 1649 07/24/22 0453 07/26/22 0029  NA 137 137 139  K 4.5 3.7 3.6  CL 107 107 106  CO2 18* 18* 17*  GLUCOSE 88 86 95  BUN 15 12 13   CREATININE 0.94 0.88 0.73  CALCIUM 9.2 8.9 9.0  MG 1.8  --   --     Lipid Panel:  Recent Labs  Lab 07/24/22 0453  CHOL 136  TRIG 304*  HDL 51  CHOLHDL 2.7  VLDL 61*  LDLCALC 24    HgbA1c:  Recent Labs  Lab 07/24/22 0453  HGBA1C 4.9    Urine Drug Screen: No results for input(s): "LABOPIA", "COCAINSCRNUR", "LABBENZ", "AMPHETMU", "THCU", "LABBARB" in the last 168 hours.  Alcohol Level  Recent Labs  Lab 07/23/22 1203  ETH <10     IMAGING past 24 hours No results found.  PHYSICAL EXAM Constitutional: Cachetic, ill-appearing patient in no acute distress Cardiovascular: Normal rate and regular rhythm.  Respiratory: Effort normal and regular on supplemental O2 GI: Soft.  No distension. There is no tenderness.  Skin: WDI MSK: Diffuse muscle wasting   Neuro: Patient lying bed, eyes open, more awake alert and interactive today, with good eye contact. Grunting today, no words, not follow commands.  Global aphasia No significant gaze preference today, moving eyes bilaterally.  Blinking to visual threat  bilaterally.   Left facial droop. Tongue protrusion not cooperative.   Left upper extremity slow drift when lifting up.  Right upper extremity resistant to be extended, increased muscle tone, but able to hold against gravity without minimal drift.  Bilateral lower extremity in flexed position, increased muscle tone but mild withdraw to pain equally.  Sensation, coordination and gait not tested.   ASSESSMENT/PLAN [name] [name] is a 72 y.o. female with history of HTN, HLD, STEMI, CABG, tobacco use, seizures, 4 strokes with most recent last month, Afib on Eliquis who presented 6/21 AM after being found with rhythmic jerking movements of the bilateral upper extremities.   Stroke with hemorrhagic conversion - right MCA moderate and left frontal punctate infarcts with right BG hemorrhagic conversion, likely due to A-fib even on Eliquis CT head Acute 1.1 x 0.9 x 1.2 cm intraparenchymal hemorrhage in the right basal ganglia. Possible trace intraventricular extension of hemorrhage of the right lateral ventricle. Remote left MCA and PCA territory infarct CTA head & neck Similar occlusion of a right proximal M2 MCA branch. Similar occlusion of the left ICA in the neck with poor opacification of small left MCA and ACA vessels. Similar occluded right vertebral artery. Similar severe stenosis of the left vertebral  artery origin Repeat CT 6/21: Stable hematoma MRI  Acute right MCA distribution infarct underlies the petechial hemorrhage in the right putamen. Tiny acute infarct in the superior left frontal lobe. 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  2D Echo 06/23/22 EF 40-45% LDL 24 HgbA1c 4.9 UDS pending VTE prophylaxis - SCDs Eliquis (apixaban) daily prior to admission, now on No antithrombotic due to hemorrhagic conversion Therapy recommendations: pending Disposition:  pending  Seizure Presented with unresponsiveness with bilateral upper extremity  jerking Received Versed for seizure activity EEG 6/21: suggestive of cortical dysfunction, likely secondary to underlying ICH.  EEG 6/22: suggestive of cortical dysfunction arising from right frontotemporal region, likely secondary to underlying ICH. No seizures.  LTM EEG discontinued Continue Keppra 500mg  BID  History of stroke 08/2012 had left MCA stroke, put on loop recorder 2015 found to have A-fib on loop recorder, started on Eliquis 03/2017 admitted for difficulty speaking.  CT no acute abnormality.  CTA head and neck right ICA 80% stenosis, left VA origin severe stenosis and right VA occlusion, as well as significant intracranial stenosis.  Carotid Doppler negative.  MRI showed left MCA infarct and old large left MCA and PCA infarcts.  LDL 53, A1c 5.6.  2D echo EF normal range.  Continue on Eliquis given noncompliance PTA.  Has residual mild expressive aphasia 06/2022 again admitted for worsening aphasia.  CT head and neck found new left ICA occlusion.  Right ICA 75% stenosis, unchanged vertebral arteries as above.  MRI showed left MCA small high convexity infarct.  EF 40 to 45%.  Continue on Eliquis.  PAF Found in 2015 by loop recorder On Eliquis PTA Currently AC on hold due to hemorrhagic conversion Resume home metoprolol  Hypertension Home meds:  Toprol-XL 25mg  PRN Labetalol and Hydralazine IV  On metoprolol 25 twice daily BP goal < 160 Long-term BP goal normotensive  Hyperlipidemia Home meds:  Lipitor 80mg , Zetia 10mg  LDL 24, goal < 70 Decrease Lipitor to 40 given low LDL Continue statin at discharge  Dysphagia Did not pass swallow Currently n.p.o. On TF, d/c IVF Coretrak placed  Tobacco abuse Current smoker Smoking cessation counseling will be provided  Other Stroke Risk Factors Advanced Age >/= 57  CAD/STEMI status post CABG Family hx stroke (brother)  Other Active Problems ? Medication non-compliance Dementia - Aricept home medication, on hold due to no  p.o. access  Hospital day # 3   Pt seen by Neuro NP/APP and later by MD. Note/plan to be edited by MD as needed.     Elmer Picker, DNP, FNP-BC [facility] Pager: [phone]  ATTENDING NOTE: I reviewed above note and agree with the assessment and plan. Pt was seen and examined.   No family at the bedside.  Patient lying bed, eyes open on voice, seems more awake alert than yesterday, however still has global aphasia, moaning, not follow commands.  Still has left upper extremity weakness, right upper extremity increased muscle tone, bilateral lower extremity in flexed position withdraw to pain strongly.  BP under control, off Cleviprex, put on home metoprolol after core track tube.  Started on tube feeding and DC IV fluid.  Transition Keppra from IV to p.o.  Added statin, decreased Lipitor from 80 to 40 given low LDL.  PT and OT pending reevaluation.  For detailed assessment and plan, please refer to above/below as I have made changes wherever appropriate.   Marvel Plan, MD PhD Stroke Neurology 07/26/2022 6:44 PM  This patient is critically ill due to right MCA stroke with hemorrhagic transformation, PAF, seizure on presentation, dysphagia and at significant risk of neurological worsening, death form status epilepticus, recurrent stroke, cerebral edema, brain herniation, heart failure, aspiration. This patient's care requires constant monitoring of vital signs, hemodynamics, respiratory and cardiac monitoring, review of multiple databases, neurological assessment, discussion with family, other specialists and medical decision making of high complexity. I spent 30 minutes of neurocritical care time in the care of this patient.   To contact Stroke Continuity provider, please refer to [email or web address]. After hours, contact General Neurology

## 2022-07-26 NOTE — Progress Notes (Signed)
Patient received from 4N ICU via bed/staff. Unit check in process completed.

## 2022-07-26 NOTE — Procedures (Signed)
Cortrak  Person Inserting Tube:  Jailyn Leeson L, RD Tube Type:  Cortrak - 43 inches Tube Size:  10 Tube Location:  Left nare Secured by: Bridle Technique Used to Measure Tube Placement:  Marking at nare/corner of mouth Cortrak Secured At:  65 cm   Cortrak Tube Team Note:  Consult received to place a Cortrak feeding tube.   X-ray is required, abdominal x-ray has been ordered by the Cortrak team. Please confirm tube placement before using the Cortrak tube.   If the tube becomes dislodged please keep the tube and contact the Cortrak team at [email or web address] for replacement.  If after hours and replacement cannot be delayed, place a NG tube and confirm placement with an abdominal x-ray.    Mukund Weinreb RD, LDN Clinical Dietitian See AMiON for contact information.    

## 2022-07-26 NOTE — Progress Notes (Signed)
Speech Language Pathology Treatment: Dysphagia  Patient Details Name: [name] [name] MRN: [id number] DOB: July 09, 1950 Today's Date: 07/26/2022 Time: 7829-5621 SLP Time Calculation (min) (ACUTE ONLY): 12 min  Assessment / Plan / Recommendation Clinical Impression  Pt alert and attempting to respond to choice questions and cues to repeat single words. Definitely asked for water. SLP able to siphon drips of water into pts mouth which she swallowed without coughing. Also took bites of puree with mild left oral residue. Pt could not pull from a straw or sip from a cup edge today. Unsure how changed pt is from baseline. Will f/u for more trials. May need pt positioned with PT/OT for better Po trials. Family present may also be helpful.   HPI HPI: 72 y.o. female presented to ED via EMS with AMS, seizure, new CVA. PMHx hypertension, hyperlipidemia, STEMI, CABG, tobacco use, seizures, 4 strokes with most recent last month (bedside swallow eval with recs for dysphagia 2/chopped, thin liquids). MRI: Acute right MCA distribution infarct underlies the petechial  hemorrhage in the right putamen. Tiny acute infarct in the superior left frontal lobe.2. Extensive chronic ischemic injury especially in the left cerebral hemisphere. Currently, patient on long-term EEG.      SLP Plan  Continue with current plan of care      Recommendations for follow up therapy are one component of a multi-disciplinary discharge planning process, led by the attending physician.  Recommendations may be updated based on patient status, additional functional criteria and insurance authorization.    Recommendations  Diet recommendations: NPO Medication Administration: Via alternative means                              Continue with current plan of care     Jilliam Bellmore, Riley Nearing  07/26/2022, 12:42 PM

## 2022-07-27 DIAGNOSIS — I6389 Other cerebral infarction: Secondary | ICD-10-CM | POA: Diagnosis not present

## 2022-07-27 LAB — CBC WITH DIFFERENTIAL/PLATELET
Abs Immature Granulocytes: 0.01 10*3/uL (ref 0.00–0.07)
Basophils Absolute: 0 10*3/uL (ref 0.0–0.1)
Basophils Relative: 0 %
Eosinophils Absolute: 0 10*3/uL (ref 0.0–0.5)
Eosinophils Relative: 0 %
HCT: 37.8 % (ref 36.0–46.0)
Hemoglobin: 12.6 g/dL (ref 12.0–15.0)
Immature Granulocytes: 0 %
Lymphocytes Relative: 16 %
Lymphs Abs: 1.1 10*3/uL (ref 0.7–4.0)
MCH: 30 pg (ref 26.0–34.0)
MCHC: 33.3 g/dL (ref 30.0–36.0)
MCV: 90 fL (ref 80.0–100.0)
Monocytes Absolute: 0.6 10*3/uL (ref 0.1–1.0)
Monocytes Relative: 9 %
Neutro Abs: 5.1 10*3/uL (ref 1.7–7.7)
Neutrophils Relative %: 75 %
Platelets: 216 10*3/uL (ref 150–400)
RBC: 4.2 MIL/uL (ref 3.87–5.11)
RDW: 14 % (ref 11.5–15.5)
WBC: 6.9 10*3/uL (ref 4.0–10.5)
nRBC: 0 % (ref 0.0–0.2)

## 2022-07-27 LAB — PHOSPHORUS
Phosphorus: 2.9 mg/dL (ref 2.5–4.6)
Phosphorus: 2.5 mg/dL (ref 2.5–4.6)

## 2022-07-27 LAB — MAGNESIUM
Magnesium: 1.7 mg/dL (ref 1.7–2.4)
Magnesium: 1.7 mg/dL (ref 1.7–2.4)

## 2022-07-27 LAB — GLUCOSE, CAPILLARY
Glucose-Capillary: 103 mg/dL — ABNORMAL HIGH (ref 70–99)
Glucose-Capillary: 112 mg/dL — ABNORMAL HIGH (ref 70–99)
Glucose-Capillary: 114 mg/dL — ABNORMAL HIGH (ref 70–99)
Glucose-Capillary: 132 mg/dL — ABNORMAL HIGH (ref 70–99)
Glucose-Capillary: 96 mg/dL (ref 70–99)

## 2022-07-27 LAB — BASIC METABOLIC PANEL
Anion gap: 10 (ref 5–15)
BUN: 19 mg/dL (ref 8–23)
CO2: 20 mmol/L — ABNORMAL LOW (ref 22–32)
Calcium: 8.7 mg/dL — ABNORMAL LOW (ref 8.9–10.3)
Chloride: 107 mmol/L (ref 98–111)
Creatinine, Ser: 0.68 mg/dL (ref 0.44–1.00)
GFR, Estimated: >60 mL/min (ref >60)
Glucose, Bld: 123 mg/dL — ABNORMAL HIGH (ref 70–99)
Potassium: 3.2 mmol/L — ABNORMAL LOW (ref 3.5–5.1)
Sodium: 137 mmol/L (ref 135–145)

## 2022-07-27 MED ORDER — DONEPEZIL HCL 10 MG PO TABS
5.0000 mg | ORAL_TABLET | Freq: Every day | ORAL | Status: DC
  Administered 2022-07-27 – 2022-08-01 (×6): 5 mg via ORAL
  Filled 2022-07-27 (×6): qty 1

## 2022-07-27 NOTE — Progress Notes (Signed)
Speech Language Pathology Treatment: Dysphagia  Patient Details Name: [name] [name] MRN: [id number] DOB: 03-14-1950 Today's Date: 07/27/2022 Time: 0947-1010 SLP Time Calculation (min) (ACUTE ONLY): 23 min  Assessment / Plan / Recommendation Clinical Impression  Pt demonstrates improved positioning and attention to PO today after working with PT. Pt able to be fed nectar thick water and puree both from a spoon with prolonged oral phase, anterior spillage and left sided pocketing. Pt tries to feed herself but cannot problem solve use of left hand and grip very weak. Pt demonstrates no coughing with PO, but risk of aspiration is high. Risk of failure to thrive is also high. Unlikely that pt would be able to achieve consistent, adequate nutrition with an oral only means. However a puree/nectar diet would be highly appropriate if goals of care were for comfort with known risk of aspiration. Need further clarification with family. For now can attempt nectar teaspoon and puree with staff to determine how it may be tolerated while pt admitted. Instrumental testing would be very difficult with positioning and brief periods of arousal. May f/u with MBS if possible   HPI HPI: 72 y.o. female presented to ED via EMS with AMS, seizure, new CVA. PMHx hypertension, hyperlipidemia, STEMI, CABG, tobacco use, seizures, 4 strokes with most recent last month (bedside swallow eval with recs for dysphagia 2/chopped, thin liquids). MRI: Acute right MCA distribution infarct underlies the petechial  hemorrhage in the right putamen. Tiny acute infarct in the superior left frontal lobe.2. Extensive chronic ischemic injury especially in the left cerebral hemisphere. Currently, patient on long-term EEG.      SLP Plan  Continue with current plan of care      Recommendations for follow up therapy are one component of a multi-disciplinary discharge planning process, led by the attending physician.  Recommendations may be updated  based on patient status, additional functional criteria and insurance authorization.    Recommendations  Diet recommendations: Nectar-thick liquid;Dysphagia 1 (puree) Liquids provided via: Teaspoon Medication Administration: Via alternative means Supervision: Full supervision/cueing for compensatory strategies Compensations: Minimize environmental distractions;Monitor for anterior loss Postural Changes and/or Swallow Maneuvers: Seated upright 90 degrees                  Oral care QID   Frequent or constant Supervision/Assistance Dysphagia, unspecified (R13.10)     Continue with current plan of care     Theoren Palka, Riley Nearing  07/27/2022, 10:17 AM

## 2022-07-27 NOTE — Progress Notes (Addendum)
STROKE TEAM PROGRESS NOTE   INTERVAL HISTORY Some improvement seen in neuroexam as patient seems to be more awake and interactive today.  Still nonverbal but gaze preference has improved as she is moving eyes bilaterally. She does have a cortrak in now. SLP re evaled and she did not pass Palliative consult placed  Vitals:   07/27/22 0021 07/27/22 0404 07/27/22 0738 07/27/22 0740  BP: 129/73 (!) 155/73  (!) 144/83  Pulse:  72  74  Resp:  17  19  Temp: 98 F (36.7 C) 98.5 F (36.9 C)  98.4 F (36.9 C)  TempSrc: Axillary Axillary  Axillary  SpO2:  99%  100%  Weight:   38.2 kg    CBC:  Recent Labs  Lab 07/26/22 0029 07/27/22 0630  WBC 7.9 6.9  NEUTROABS 5.7 5.1  HGB 13.1 12.6  HCT 40.0 37.8  MCV 94.6 90.0  PLT 193 216    Basic Metabolic Panel:  Recent Labs  Lab 07/26/22 0029 07/26/22 1136 07/26/22 1744 07/27/22 0630  NA 139  --   --  137  K 3.6  --   --  3.2*  CL 106  --   --  107  CO2 17*  --   --  20*  GLUCOSE 95  --   --  123*  BUN 13  --   --  19  CREATININE 0.73  --   --  0.68  CALCIUM 9.0  --   --  8.7*  MG  --    < > 1.6* 1.7  PHOS  --    < > 2.6 2.9   < > = values in this interval not displayed.    Lipid Panel:  Recent Labs  Lab 07/24/22 0453  CHOL 136  TRIG 304*  HDL 51  CHOLHDL 2.7  VLDL 61*  LDLCALC 24    HgbA1c:  Recent Labs  Lab 07/24/22 0453  HGBA1C 4.9    Urine Drug Screen: No results for input(s): "LABOPIA", "COCAINSCRNUR", "LABBENZ", "AMPHETMU", "THCU", "LABBARB" in the last 168 hours.  Alcohol Level  Recent Labs  Lab 07/23/22 1203  ETH <10     IMAGING past 24 hours DG Abd Portable 1V  Result Date: 07/26/2022 CLINICAL DATA:  Feeding tube placement EXAM: PORTABLE ABDOMEN - 1 VIEW COMPARISON:  KUB 07/23/2022 FINDINGS: The enteric catheter tip projects over left abdomen, likely in the stomach. There is mild gaseous distention of the bowel without evidence of mechanical obstruction there is no definite free intraperitoneal  air, within the confines of supine technique. There is retrocardiac opacity which appears slightly worsened compared to the prior study. Median sternotomy wires and a cardiac loop recorder device are again noted. There is no acute osseous abnormality. IMPRESSION: 1. Enteric catheter projects over the left hemiabdomen likely in the stomach. 2. Retrocardiac opacity appears slightly worsened since the prior study and may reflect atelectasis or developing infection. Electronically Signed   By: Lesia Hausen M.D.   On: 07/26/2022 11:17    PHYSICAL EXAM Constitutional: Cachetic, ill-appearing patient in no acute distress Cardiovascular: Normal rate and regular rhythm.  Respiratory: Effort normal and regular on supplemental O2 GI: Soft.  No distension. There is no tenderness.  Skin: WDI MSK: Diffuse muscle wasting   Neuro: Patient lying bed, eyes open, more awake alert and interactive today, with good eye contact. Grunting today, no words, not follow commands.  Global aphasia No significant gaze preference today, moving eyes bilaterally.  Blinking to visual threat bilaterally.  Left facial droop. Tongue protrusion not cooperative.   Left upper extremity slow drift when lifting up.  Right upper extremity resistant to be extended, increased muscle tone, but able to hold against gravity without minimal drift.  Bilateral lower extremity in flexed position, increased muscle tone but mild withdraw to pain equally.  Sensation, coordination and gait not tested.   ASSESSMENT/PLAN Ms. BARI LEIB is a 72 y.o. female with history of HTN, HLD, STEMI, CABG, tobacco use, seizures, 4 strokes with most recent last month, Afib on Eliquis who presented 6/21 AM after being found with rhythmic jerking movements of the bilateral upper extremities.   Stroke with hemorrhagic conversion - right MCA moderate and left frontal punctate infarcts with right BG hemorrhagic conversion, likely due to A-fib even on Eliquis CT head  Acute 1.1 x 0.9 x 1.2 cm intraparenchymal hemorrhage in the right basal ganglia. Possible trace intraventricular extension of hemorrhage of the right lateral ventricle. Remote left MCA and PCA territory infarct CTA head & neck Similar occlusion of a right proximal M2 MCA branch. Similar occlusion of the left ICA in the neck with poor opacification of small left MCA and ACA vessels. Similar occluded right vertebral artery. Similar severe stenosis of the left vertebral artery origin Repeat CT 6/21: Stable hematoma MRI  Acute right MCA distribution infarct underlies the petechial hemorrhage in the right putamen. Tiny acute infarct in the superior left frontal lobe. 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  2D Echo 06/23/22 EF 40-45% LDL 24 HgbA1c 4.9 UDS pending VTE prophylaxis - SCDs Eliquis (apixaban) daily prior to admission, now on No antithrombotic due to hemorrhagic conversion Therapy recommendations: pending Disposition:  pending, palliative care consult requested  Seizure Presented with unresponsiveness with bilateral upper extremity jerking Received Versed for seizure activity EEG 6/21: suggestive of cortical dysfunction, likely secondary to underlying ICH.  EEG 6/22: suggestive of cortical dysfunction arising from right frontotemporal region, likely secondary to underlying ICH. No seizures.  LTM EEG discontinued Continue Keppra 500mg  BID  History of stroke 08/2012 had left MCA stroke, put on loop recorder 2015 found to have A-fib on loop recorder, started on Eliquis 03/2017 admitted for difficulty speaking.  CT no acute abnormality.  CTA head and neck right ICA 80% stenosis, left VA origin severe stenosis and right VA occlusion, as well as significant intracranial stenosis.  Carotid Doppler negative.  MRI showed left MCA infarct and old large left MCA and PCA infarcts.  LDL 53, A1c 5.6.  2D echo EF normal range.  Continue on Eliquis given noncompliance  PTA.  Has residual mild expressive aphasia 06/2022 again admitted for worsening aphasia.  CT head and neck found new left ICA occlusion.  Right ICA 75% stenosis, unchanged vertebral arteries as above.  MRI showed left MCA small high convexity infarct.  EF 40 to 45%.  Continue on Eliquis.  PAF Found in 2015 by loop recorder On Eliquis PTA Currently AC on hold due to hemorrhagic conversion Resume home metoprolol  Hypertension Home meds:  Toprol-XL 25mg  PRN Labetalol and Hydralazine IV  On metoprolol 25 twice daily BP goal < 160 Long-term BP goal normotensive  Hyperlipidemia Home meds:  Lipitor 80mg , Zetia 10mg  LDL 24, goal < 70 Decrease Lipitor to 40 given low LDL Continue statin at discharge  Dysphagia Dysphagia 1 diet with nectar thick liquid On TF, d/c IVF Coretrak in place Still difficulty with p.o. intake Palliative care consulted  Tobacco abuse Current smoker Smoking cessation  counseling will be provided  Other Stroke Risk Factors Advanced Age >/= 27  CAD/STEMI status post CABG Family hx stroke (brother)  Other Active Problems ? Medication non-compliance Dementia - Aricept home medication, restarted today  Hospital day # 4   Pt seen by Neuro NP/APP and later by MD. Note/plan to be edited by MD as needed.     Elmer Picker, DNP, FNP-BC [facility] Pager: [phone]  ATTENDING NOTE: I reviewed above note and agree with the assessment and plan. Pt was seen and examined.   No family at bedside.  Patient drowsy sleepy, still very lethargic.  Per report, patient was more awake alert today with speech therapist.  She is on pured diet and nectar thick liquid now, however still has difficulty with p.o. intake, not able to be adequate.  Consulted palliative care.  Continue Keppra and statin.  Resume Aricept  For detailed assessment and plan, please refer to above/below as I have made changes wherever appropriate.   Marvel Plan, MD PhD Stroke  Neurology 07/27/2022 7:18 PM   To contact Stroke Continuity provider, please refer to [email or web address]. After hours, contact General Neurology

## 2022-07-27 NOTE — Care Management Important Message (Signed)
Important Message  [name]: [id number] Date of Birth: 22-Jan-1951   Medicare Important Message Given:  Yes     [name] 07/27/2022, 3:10 PM

## 2022-07-27 NOTE — Progress Notes (Signed)
Physical Therapy Treatment Patient Details Name: [name] [name] MRN: [id number] DOB: [birth year] Today's Date: 07/27/2022   History of Present Illness 72 y.o. female who presents 07/23/22 after being found in the fetal position with rhythmic jerking movements of the bilateral upper extremities. CT acute IPH in right basal ganglia with possible IVH; long-term EEG ordered; NIHSS=24;   PMH significant of: hypertension, hyperlipidemia, STEMI, CABG, tobacco use, seizures, 4 strokes (including L MCA) and atrial fibrillation on Eliquis    PT Comments    Pt demonstrates some improvement in alertness and command following today, following up to 75% of 1 step commands. Continues with garbled speech, but makes attempt to verbalize. Session focused on PROM to extremities, cervical stretching, repositioning, rhythmic initiation of bed mobility (rolling), and sitting balance. Pt requiring two person assist for all aspects of mobility. At high risk of skin breakdown; recommend bilateral PRAFO's, Q2 turns and air mattress bed. Patient will benefit from continued inpatient follow up therapy, <3 hours/day in order to address deficits and maximize functional mobility.    Recommendations for follow up therapy are one component of a multi-disciplinary discharge planning process, led by the attending physician.  Recommendations may be updated based on patient status, additional functional criteria and insurance authorization.  Follow Up Recommendations  Can patient physically be transported by private vehicle: No    Assistance Recommended at Discharge Frequent or constant Supervision/Assistance  Patient can return home with the following A lot of help with bathing/dressing/bathroom;Assistance with feeding;Direct supervision/assist for medications management;Direct supervision/assist for financial management;Assist for transportation;Help with stairs or ramp for entrance   Equipment Recommendations  None recommended  by PT    Recommendations for Other Services       Precautions / Restrictions Precautions Precautions: Fall Precaution Comments: seizure, Cortrak, concerns for pressure wound due to positioning Restrictions Weight Bearing Restrictions: No     Mobility  Bed Mobility Overal bed mobility: Needs Assistance Bed Mobility: Rolling, Sidelying to Sit, Sit to Supine Rolling: Total assist Sidelying to sit: Total assist, +2 for physical assistance   Sit to supine: Total assist, +2 for physical assistance   General bed mobility comments: Rhythmic initiation, rolling to R with facilitation at hips with bed pad x 2, pt grasping onto rail with LUE. Assist at BLE's and trunk    Transfers                   General transfer comment: not appropriate    Ambulation/Gait                   Stairs             Wheelchair Mobility    Modified Rankin (Stroke Patients Only) Modified Rankin (Stroke Patients Only) Pre-Morbid Rankin Score: Moderate disability Modified Rankin: Severe disability     Balance Overall balance assessment: Needs assistance Sitting-balance support: Feet supported Sitting balance-Leahy Scale: Poor Sitting balance - Comments: minA for sitting balance, R scapular assist for protraction, cervical support provided to maintain midline                                    Cognition Arousal/Alertness: Awake/alert Behavior During Therapy: Flat affect Overall Cognitive Status: Difficult to assess                                 General Comments: Garbled  speech. able to follow one step commands ~75% of the time        Exercises General Exercises - Lower Extremity Ankle Circles/Pumps: PROM, Both, 10 reps, Supine Hip ABduction/ADduction: PROM, Both, 10 reps, Supine Other Exercises Other Exercises: Cervical stretching into R lateral flexion and cervical rotation Other Exercises: Gentle R knee extension stretch     General Comments        Pertinent Vitals/Pain Pain Assessment Pain Assessment: Faces Faces Pain Scale: Hurts even more Pain Location: facial grimace with knee extension (R> L) otherwise no pain noted Pain Descriptors / Indicators: Grimacing Pain Intervention(s): Monitored during session, Limited activity within patient's tolerance, Repositioned    Home Living                          Prior Function            PT Goals (current goals can now be found in the care plan section) Acute Rehab PT Goals Potential to Achieve Goals: Poor Progress towards PT goals: Progressing toward goals    Frequency    Min 2X/week      PT Plan Discharge plan needs to be updated    Co-evaluation              AM-PAC PT "6 Clicks" Mobility   Outcome Measure  Help needed turning from your back to your side while in a flat bed without using bedrails?: Total Help needed moving from lying on your back to sitting on the side of a flat bed without using bedrails?: Total Help needed moving to and from a bed to a chair (including a wheelchair)?: Total Help needed standing up from a chair using your arms (e.g., wheelchair or bedside chair)?: Total Help needed to walk in hospital room?: Total Help needed climbing 3-5 steps with a railing? : Total 6 Click Score: 6    End of Session   Activity Tolerance: Patient tolerated treatment well Patient left: in bed;with call bell/phone within reach;with bed alarm set Nurse Communication: Other (comment) (PRAFO's, air mattress bed) PT Visit Diagnosis: Difficulty in walking, not elsewhere classified (R26.2);Other symptoms and signs involving the nervous system (R29.898)     Time: 5284-1324 PT Time Calculation (min) (ACUTE ONLY): 31 min  Charges:  $Therapeutic Exercise: 8-22 mins $Therapeutic Activity: 8-22 mins                     Lillia Pauls, PT, DPT Acute Rehabilitation Services Office [phone]    Norval Morton 07/27/2022, 9:59 AM

## 2022-07-28 ENCOUNTER — Encounter (HOSPITAL_COMMUNITY): Payer: Self-pay | Admitting: Neurology

## 2022-07-28 DIAGNOSIS — Z515 Encounter for palliative care: Secondary | ICD-10-CM

## 2022-07-28 DIAGNOSIS — I6389 Other cerebral infarction: Secondary | ICD-10-CM | POA: Diagnosis not present

## 2022-07-28 DIAGNOSIS — I619 Nontraumatic intracerebral hemorrhage, unspecified: Secondary | ICD-10-CM

## 2022-07-28 DIAGNOSIS — Z7189 Other specified counseling: Secondary | ICD-10-CM

## 2022-07-28 LAB — CBC WITH DIFFERENTIAL/PLATELET
Abs Immature Granulocytes: 0.02 10*3/uL (ref 0.00–0.07)
Basophils Absolute: 0 10*3/uL (ref 0.0–0.1)
Basophils Relative: 1 %
Eosinophils Absolute: 0 10*3/uL (ref 0.0–0.5)
Eosinophils Relative: 0 %
HCT: 36 % (ref 36.0–46.0)
Hemoglobin: 12.2 g/dL (ref 12.0–15.0)
Immature Granulocytes: 0 %
Lymphocytes Relative: 16 %
Lymphs Abs: 1.2 10*3/uL (ref 0.7–4.0)
MCH: 30.7 pg (ref 26.0–34.0)
MCHC: 33.9 g/dL (ref 30.0–36.0)
MCV: 90.5 fL (ref 80.0–100.0)
Monocytes Absolute: 0.8 10*3/uL (ref 0.1–1.0)
Monocytes Relative: 11 %
Neutro Abs: 5.3 10*3/uL (ref 1.7–7.7)
Neutrophils Relative %: 72 %
Platelets: 215 10*3/uL (ref 150–400)
RBC: 3.98 MIL/uL (ref 3.87–5.11)
RDW: 14 % (ref 11.5–15.5)
WBC: 7.3 10*3/uL (ref 4.0–10.5)
nRBC: 0 % (ref 0.0–0.2)

## 2022-07-28 LAB — GLUCOSE, CAPILLARY
Glucose-Capillary: 132 mg/dL — ABNORMAL HIGH (ref 70–99)
Glucose-Capillary: 113 mg/dL — ABNORMAL HIGH (ref 70–99)
Glucose-Capillary: 126 mg/dL — ABNORMAL HIGH (ref 70–99)
Glucose-Capillary: 131 mg/dL — ABNORMAL HIGH (ref 70–99)
Glucose-Capillary: 126 mg/dL — ABNORMAL HIGH (ref 70–99)
Glucose-Capillary: 116 mg/dL — ABNORMAL HIGH (ref 70–99)
Glucose-Capillary: 119 mg/dL — ABNORMAL HIGH (ref 70–99)

## 2022-07-28 LAB — BASIC METABOLIC PANEL
Anion gap: 9 (ref 5–15)
BUN: 22 mg/dL (ref 8–23)
CO2: 22 mmol/L (ref 22–32)
Calcium: 8.3 mg/dL — ABNORMAL LOW (ref 8.9–10.3)
Chloride: 106 mmol/L (ref 98–111)
Creatinine, Ser: 0.73 mg/dL (ref 0.44–1.00)
GFR, Estimated: >60 mL/min (ref >60)
Glucose, Bld: 130 mg/dL — ABNORMAL HIGH (ref 70–99)
Potassium: 3.7 mmol/L (ref 3.5–5.1)
Sodium: 137 mmol/L (ref 135–145)

## 2022-07-28 MED ORDER — POLYETHYLENE GLYCOL 3350 17 G PO PACK
17.0000 g | PACK | Freq: Every day | ORAL | Status: DC | PRN
  Administered 2022-07-28: 17 g via ORAL
  Filled 2022-07-28: qty 1

## 2022-07-28 MED ORDER — BISACODYL 10 MG RE SUPP
10.0000 mg | Freq: Once | RECTAL | Status: AC
  Administered 2022-07-28: 10 mg via RECTAL
  Filled 2022-07-28: qty 1

## 2022-07-28 MED ORDER — FLEET ENEMA 7-19 GM/118ML RE ENEM
1.0000 | ENEMA | Freq: Once | RECTAL | Status: AC
  Administered 2022-07-28: 1 via RECTAL
  Filled 2022-07-28: qty 1

## 2022-07-28 MED ORDER — OSMOLITE 1.2 CAL PO LIQD
750.0000 mL | ORAL | Status: AC
  Administered 2022-07-28: 750 mL

## 2022-07-28 MED ORDER — FREE WATER
50.0000 mL | Status: DC
  Administered 2022-07-28 – 2022-08-04 (×41): 50 mL

## 2022-07-28 NOTE — Progress Notes (Signed)
Speech Language Pathology Treatment: Dysphagia  Patient Details Name: [name] [name] MRN: [id number] DOB: 1950-03-24 Today's Date: 07/28/2022 Time: 4098-1191 SLP Time Calculation (min) (ACUTE ONLY): 17 min  Assessment / Plan / Recommendation Clinical Impression  Pt seen for dysphagia with sister at bedside. Therapist reviewed status of swallow and strategies for feeding. Positioning is challenging with pt's leg contractures, lean to left and head tilt to left however able to get her into optimal position. She accepted teaspoon sips nectar liquid and puree without significant spillage. Mild delayed transit and suspect mild delay in pharyngeal swallow. Demonstrated to sister how to look for pt's swallow for timing of next bite as sister took over feeding pt. Pt's voice remained clear and there were no signs of aspiration. Pt is at risk for aspiration, decreased nutritional status however readily accepting her breakfast this morning. Overheard RN discussing Palliative care with pt's sister which is recommended for goals of care. ST will continue   HPI HPI: 72 y.o. female presented to ED via EMS with AMS, seizure, new CVA. PMHx hypertension, hyperlipidemia, STEMI, CABG, tobacco use, seizures, 4 strokes with most recent last month (bedside swallow eval with recs for dysphagia 2/chopped, thin liquids). MRI: Acute right MCA distribution infarct underlies the petechial  hemorrhage in the right putamen. Tiny acute infarct in the superior left frontal lobe.2. Extensive chronic ischemic injury especially in the left cerebral hemisphere. Currently, patient on long-term EEG.      SLP Plan  Continue with current plan of care      Recommendations for follow up therapy are one component of a multi-disciplinary discharge planning process, led by the attending physician.  Recommendations may be updated based on patient status, additional functional criteria and insurance authorization.    Recommendations  Diet  recommendations: Nectar-thick liquid;Dysphagia 1 (puree) Liquids provided via: Teaspoon Medication Administration: Crushed with puree (or Cortrak) Supervision: Full supervision/cueing for compensatory strategies Compensations: Minimize environmental distractions;Slow rate;Small sips/bites Postural Changes and/or Swallow Maneuvers: Seated upright 90 degrees                  Oral care QID   Frequent or constant Supervision/Assistance Dysphagia, unspecified (R13.10)     Continue with current plan of care     [name]  07/28/2022, 9:02 AM

## 2022-07-28 NOTE — NC FL2 (Signed)
[place] MEDICAID FL2 LEVEL OF CARE FORM     IDENTIFICATION  Patient Name: [name] [name] Birthdate: [birth year] Sex: female Admission Date (Current Location): 07/23/2022  [place] and [place] Number:  Haynes Bast [id number] Facility and Address:  [facility], [address] [place], [place] [address]      Provider Number: [id number]  Attending Physician Name and Address:  Stroke, Md, MD  Relative Name and Phone Number:  [name] Sister   [phone]    Current Level of Care: Hospital Recommended Level of Care: Skilled Nursing Facility Prior Approval Number:    Date Approved/Denied:   PASRR Number: [id number]  Discharge Plan: SNF    Current Diagnoses: Patient Active Problem List   Diagnosis Date Noted   ICH (intracerebral hemorrhage) (HCC) 07/23/2022   Stroke (HCC) 06/22/2022   HLD (hyperlipidemia) 06/22/2022   Protein-calorie malnutrition, severe (HCC) 06/22/2022   CAD (coronary artery disease) 06/22/2022   Hypoglycemia 06/22/2022   Heart failure with mildly reduced ejection fraction (HFmrEF) (HCC) 11/05/2021   COVID-19 11/04/2021   Dehydration 11/04/2021   Syncope 11/04/2021   Atrial fibrillation with RVR (HCC) 11/03/2021   Chronic anticoagulation 04/24/2019   Closed displaced fracture of proximal phalanx of right little finger    Dyslipidemia, goal LDL below 70    History of stroke    Hypokalemia    History of ST elevation myocardial infarction (STEMI)    History of seizure    History of memory loss    PAF (paroxysmal atrial fibrillation) (HCC) 10/19/2013   Constipation 10/04/2013   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 07/25/12 07/28/2012   Essential hypertension 07/21/2012   Tobacco abuse 07/21/2012    Orientation RESPIRATION BLADDER Height & Weight     Self   Normal Incontinent, External catheter Weight: 84 lb 4.8 oz (38.2 kg) Height:  5' (152.4 cm)  BEHAVIORAL SYMPTOMS/MOOD NEUROLOGICAL BOWEL NUTRITION STATUS    Convulsions/Seizures Continent Diet (see discharge summary)  AMBULATORY STATUS COMMUNICATION OF NEEDS Skin   Total Care Verbally Normal, Other (Comment) (dry)                       Personal Care Assistance Level of Assistance  Bathing, Feeding, Dressing, Total care Bathing Assistance: Maximum assistance Feeding assistance: Maximum assistance Dressing Assistance: Maximum assistance Total Care Assistance: Maximum assistance   Functional Limitations Info  Sight, Hearing, Speech Sight Info: Adequate Hearing Info: Impaired Speech Info: Adequate    SPECIAL CARE FACTORS FREQUENCY  PT (By licensed PT), OT (By licensed OT)     PT Frequency: 5x week OT Frequency: 5x week            Contractures Contractures Info: Not present    Additional Factors Info  Code Status, Allergies Code Status Info: full Allergies Info: NKA           Current Medications (07/28/2022):  This is the current hospital active medication list Current Facility-Administered Medications  Medication Dose Route Frequency Provider Last Rate Last Admin    stroke: early stages of recovery book   Does not apply Once de Saintclair Halsted, Cortney E, NP       acetaminophen (TYLENOL) tablet 650 mg  650 mg Oral Q4H PRN de Saintclair Halsted, Cortney E, NP   650 mg at 07/27/22 0855   Or   acetaminophen (TYLENOL) 160 MG/5ML  solution 650 mg  650 mg Per Tube Q4H PRN de Saintclair Halsted, Cortney E, NP       Or   acetaminophen (TYLENOL) suppository 650 mg  650 mg Rectal Q4H PRN de Saintclair Halsted, Cortney E, NP       atorvastatin (LIPITOR) tablet 40 mg  40 mg Per Tube Daily Marvel Plan, MD   40 mg at 07/28/22 0938   donepezil (ARICEPT) tablet 5 mg  5 mg Oral QHS Marvel Plan, MD   5 mg at 07/27/22 2305   feeding supplement (OSMOLITE 1.2 CAL) liquid 1,000 mL  1,000 mL Per Tube Continuous Marvel Plan, MD 50 mL/hr at 07/28/22 1500 Infusion Verify at 07/28/22 1500   feeding supplement (PROSource TF20) liquid 60 mL  60 mL Per Tube Daily Marvel Plan, MD   60 mL at 07/28/22 0938   heparin injection 5,000 Units  5,000 Units Subcutaneous Q12H Marvel Plan, MD   5,000 Units at 07/28/22 5643   hydrALAZINE (APRESOLINE) injection 5-20 mg  5-20 mg Intravenous Q4H PRN Marvel Plan, MD   10 mg at 07/26/22 1348   labetalol (NORMODYNE) injection 5-20 mg  5-20 mg Intravenous Q2H PRN Marvel Plan, MD   10 mg at 07/25/22 1638   levETIRAcetam (KEPPRA) tablet 500 mg  500 mg Per Tube BID Marvel Plan, MD   500 mg at 07/28/22 3295   metoprolol tartrate (LOPRESSOR) tablet 25 mg  25 mg Per Tube BID Marvel Plan, MD   25 mg at 07/28/22 1884   multivitamin with minerals tablet 1 tablet  1 tablet Per Tube Daily Marvel Plan, MD   1 tablet at 07/28/22 1660   Oral care mouth rinse  15 mL Mouth Rinse PRN Bhagat, Srishti L, MD   15 mL at 07/23/22 2119   Oral care mouth rinse  15 mL Mouth Rinse 4 times per day Marvel Plan, MD   15 mL at 07/28/22 1200   Oral care mouth rinse  15 mL Mouth Rinse PRN Marvel Plan, MD       pantoprazole (PROTONIX) injection 40 mg  40 mg Intravenous QHS de Saintclair Halsted, Cortney E, NP   40 mg at 07/27/22 2305   polyethylene glycol (MIRALAX / GLYCOLAX) packet 17 g  17 g Oral Daily PRN de Saintclair Halsted, Cortney E, NP   17 g at 07/28/22 1422   senna-docusate (Senokot-S) tablet 1 tablet  1 tablet Oral BID de Saintclair Halsted, Cortney E, NP   1 tablet at 07/28/22 6301   thiamine (VITAMIN B1) tablet 100 mg  100 mg Per Tube Daily Marvel Plan, MD   100 mg at 07/28/22 6010     Discharge Medications: Please see discharge summary for a list of discharge medications.  Relevant Imaging Results:  Relevant Lab Results:   Additional Information SSN: [id number]  Lorri Frederick, LCSW

## 2022-07-28 NOTE — Plan of Care (Signed)
Called by RN regarding cardiac telemetry detecting a 60 sec long run of V-tach that then reverted spontaneously to NSR.   Will consult Cardiology for further recommendations.   Electronically signed: Dr. Caryl Pina

## 2022-07-28 NOTE — Progress Notes (Addendum)
STROKE TEAM PROGRESS NOTE   INTERVAL HISTORY Patient is seen in her room with no family at the bedside.  She has been hemodynamically stable, and her neurological exam is unchanged.  Awaiting palliative consult.  Vitals:   07/27/22 2020 07/28/22 0035 07/28/22 0403 07/28/22 0728  BP: 113/75 123/68  120/82  Pulse: 70 75 70 93  Resp:    (!) 22  Temp: 98 F (36.7 C) 98.2 F (36.8 C) 98 F (36.7 C) 98.4 F (36.9 C)  TempSrc: Axillary Axillary Axillary Oral  SpO2: 97% 97% 97% 99%  Weight:       CBC:  Recent Labs  Lab 07/27/22 0630 07/28/22 0417  WBC 6.9 7.3  NEUTROABS 5.1 5.3  HGB 12.6 12.2  HCT 37.8 36.0  MCV 90.0 90.5  PLT 216 215    Basic Metabolic Panel:  Recent Labs  Lab 07/27/22 0630 07/27/22 1759 07/28/22 0417  NA 137  --  137  K 3.2*  --  3.7  CL 107  --  106  CO2 20*  --  22  GLUCOSE 123*  --  130*  BUN 19  --  22  CREATININE 0.68  --  0.73  CALCIUM 8.7*  --  8.3*  MG 1.7 1.7  --   PHOS 2.9 2.5  --     Lipid Panel:  Recent Labs  Lab 07/24/22 0453  CHOL 136  TRIG 304*  HDL 51  CHOLHDL 2.7  VLDL 61*  LDLCALC 24    HgbA1c:  Recent Labs  Lab 07/24/22 0453  HGBA1C 4.9    Urine Drug Screen: No results for input(s): "LABOPIA", "COCAINSCRNUR", "LABBENZ", "AMPHETMU", "THCU", "LABBARB" in the last 168 hours.  Alcohol Level  Recent Labs  Lab 07/23/22 1203  ETH <10     IMAGING past 24 hours No results found.  PHYSICAL EXAM Constitutional: Cachetic, ill-appearing patient in no acute distress Cardiovascular: Normal rate and regular rhythm.  Respiratory: Effort normal and regular on room air  GI: Soft.  No distension. There is no tenderness.  Skin: WDI MSK: Diffuse muscle wasting   Neuro: Patient lying bed, eyes open, does not respond to name or follow commands.  She will make mumbling vocalizations but cannot form recognizable words. Global aphasia No significant gaze preference today, moving eyes bilaterally.   Left facial droop.  Tongue protrusion not cooperative.   Will move all 4 extremities spontaneously but not to command. Sensation, coordination and gait not tested.   ASSESSMENT/PLAN [name] is a 72 y.o. female with history of HTN, HLD, STEMI, CABG, tobacco use, seizures, 4 strokes with most recent last month, Afib on Eliquis who presented 6/21 AM after being found with rhythmic jerking movements of the bilateral upper extremities.  She was found to have a right MCA stroke with hemorrhagic conversion.  Stroke with hemorrhagic conversion - right MCA moderate and left frontal punctate infarcts with right BG hemorrhagic conversion, likely due to A-fib even on Eliquis CT head Acute 1.1 x 0.9 x 1.2 cm intraparenchymal hemorrhage in the right basal ganglia. Possible trace intraventricular extension of hemorrhage of the right lateral ventricle. Remote left MCA and PCA territory infarct CTA head & neck Similar occlusion of a right proximal M2 MCA branch. Similar occlusion of the left ICA in the neck with poor opacification of small left MCA and ACA vessels. Similar occluded right vertebral artery. Similar severe stenosis of the left vertebral artery origin Repeat CT 6/21: Stable hematoma MRI  Acute right MCA  distribution infarct underlies the petechial hemorrhage in the right putamen. Tiny acute infarct in the superior left frontal lobe. 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  Repeat CT in am 2D Echo 06/23/22 EF 40-45% LDL 24 HgbA1c 4.9 UDS pending VTE prophylaxis - SCDs Eliquis (apixaban) daily prior to admission, now on No antithrombotic due to hemorrhagic conversion. If CT repeat stable, will start ASA 81 Therapy recommendations: SNF Disposition:  pending, palliative care on board, continue full code for now  Seizure Presented with unresponsiveness with bilateral upper extremity jerking Received Versed for seizure activity EEG 6/21: suggestive of cortical dysfunction,  likely secondary to underlying ICH.  EEG 6/22: suggestive of cortical dysfunction arising from right frontotemporal region, likely secondary to underlying ICH. No seizures.  LTM EEG discontinued Continue Keppra 500mg  BID  History of stroke 08/2012 had left MCA stroke, put on loop recorder 2015 found to have A-fib on loop recorder, started on Eliquis 03/2017 admitted for difficulty speaking.  CT no acute abnormality.  CTA head and neck right ICA 80% stenosis, left VA origin severe stenosis and right VA occlusion, as well as significant intracranial stenosis.  Carotid Doppler negative.  MRI showed left MCA infarct and old large left MCA and PCA infarcts.  LDL 53, A1c 5.6.  2D echo EF normal range.  Continue on Eliquis given noncompliance PTA.  Has residual mild expressive aphasia 06/2022 again admitted for worsening aphasia.  CT head and neck found new left ICA occlusion.  Right ICA 75% stenosis, unchanged vertebral arteries as above.  MRI showed left MCA small high convexity infarct.  EF 40 to 45%.  Continue on Eliquis.  PAF Found in 2015 by loop recorder On Eliquis PTA Currently AC on hold due to hemorrhagic conversion If CT repeat stable, will start ASA 81 Resumed home metoprolol  Hypertension Home meds:  Toprol-XL 25mg  PRN Labetalol and Hydralazine IV  On metoprolol 25 twice daily BP goal < 160 Long-term BP goal normotensive  Hyperlipidemia Home meds:  Lipitor 80mg , Zetia 10mg  LDL 24, goal < 70 Decrease Lipitor to 40 given low LDL Continue statin at discharge  Dysphagia Dysphagia 1 diet with nectar thick liquid On TF->changed to nocturnal TF Encourage po intake Try to avoid PEG  Tobacco abuse Current smoker Smoking cessation counseling will be provided  Other Stroke Risk Factors Advanced Age >/= 79  CAD/STEMI status post CABG Family hx stroke (brother)  Other Active Problems ? Medication non-compliance Dementia -restarted home Aricept  Hospital day # 5   Pt seen by  Neuro NP/APP and later by MD. Note/plan to be edited by MD as needed.    [name] [name] , MSN, AGACNP-BC [facility] See Amion for schedule and pager information 07/28/2022 11:36 AM  ATTENDING NOTE: I reviewed above note and agree with the assessment and plan. Pt was seen and examined.   No family at bedside.  Patient lying in bed, in fetal position.  Initially sleeping, however easily arousable, seems more awake alert than before, more interactive.  Able to tell me her name and age, not orientated to place.  Severe dysarthria, limited language output.  Follow limited simple commands.  Still has left facial droop, left upper extremity drift with increased muscle tone, right upper extremity against gravity with increased muscle tone.  Bilateral lower extremity in flexion position.  She has passed swallow, on pured diet and nectar thick liquid.  Will change tube feeding to nocturnal tube feeding to  facilitate p.o. intake.  Repeat CT in a.m., if stable will start aspirin 81.  Continue Keppra.  Palliative care on board, continue full code.  PT and OT recommend SNF.  For detailed assessment and plan, please refer to above/below as I have made changes wherever appropriate.   Marvel Plan, MD PhD Stroke Neurology 07/28/2022 4:12 PM    To contact Stroke Continuity provider, please refer to [email or web address]. After hours, contact General Neurology

## 2022-07-28 NOTE — Consult Note (Signed)
Consultation Note Date: 07/28/2022   Patient Name: [name] [name]  DOB: [birth year]  MRN: [id number]  Age / Sex: 72 y.o., female  PCP: Neita Goodnight Presley Raddle, MD Referring Physician: Stroke, Md, MD  Reason for Consultation: Establishing goals of care  HPI/Patient Profile: 72 y.o. female  with past medical history of 4 strokes with the most recent being last month possibly for stroke in 2014 leading to right-sided weakness and speech issues, A-fib on Eliquis, HTN/HLD, STEMI, CABG times 06/20/2012, tobacco use, seizure, family history of stroke with a brother who died at age 34 secondary to stroke/MI, admitted on 07/23/2022 with stroke, right basal ganglia ICH likely hypertensive versus hemorrhagic conversion of ischemic stroke, seizure.   Clinical Assessment and Goals of Care: I have reviewed medical records including EPIC notes, labs and imaging, received report from RN.  Noted to be cachectic with a BMI of 74-1/2.  Per today's neurology note patient is not responding to name and does not follow commands but will mumble at times with no recognizable words.  ST evaluation recommending pured diet with nectar thick liquids.  At this point tube feeding continues via core track.  Remote consult completed.  Brief life review of the patient.  Per TOC note patient admitted from [facility].  Per sister her normally able to walk and have a conversation, but patient and sister have been working toward ALF placement secondary to decreased ability to complete ADLs after recent stroke.  [name] tells me that she and Meerab had another sister who was Marrissa's main caregiver/responsible party, but she died last year.  [name] shares that she took over care this year.  She tells me that they have another sister in ALF [facility].   Call to sister/HCPOA, [name] [name], left voicemail message.     Call to sister/HCPOA, [name] [name], to discuss diagnosis prognosis, GOC, EOL wishes, disposition and options.  I introduced Palliative Medicine as specialized medical care for people living with serious illness. It focuses on providing relief from the symptoms and stress of a serious illness. The goal is to improve quality of life for both the patient and the family.  We then focused on their current illness.  We talk about [name] [name]'s acute illness of stroke.  We talked about speech therapy consult and the treatment plan.  We talk about physical therapy consult and the treatment plan.  We talk about neurology consult.  We talk about recurrent strokes and possibility/likelihood that [name] [name] will continue to have strokes.  We talk about short-term rehab.  [name] shares that she prefers a rehab facility near where she lives in [place].  The natural disease trajectory and expectations at EOL were discussed.  Advanced directives, concepts specific to code status, artifical feeding and hydration, and rehospitalization were considered and discussed.  [name] shares that she has a document completed by [employer]ward Drone naming her healthcare power of attorney and listing her advance directives.  [name] shares that Cele had wanted continued extended life support.  [name] shares her experience with  her husband who died approximately 6 years ago.  She shares that she would not choose extended life support for her sister.  We talk about "meaningful improvements".  We talk about time for outcomes, approximately 12 weeks for recovery, but at that point she will likely have all the recovery of which she is capable.  Palliative Care services outpatient were explained and offered.  We talked about the benefits of outpatient palliative services.  [name] states that she had the services for her husband.  She is agreeable to outpatient palliative services for.  Provider choice offered.  [name] would like [facility].  She has experienced with them through her  husband's.  Discussed the importance of continued conversation with family and the medical providers regarding overall plan of care and treatment options, ensuring decisions are within the context of the patient's values and GOCs.  Questions and concerns were addressed.  The family was encouraged to call with questions or concerns.  PMT will continue to support holistically.  Conference with attending, bedside nursing staff, transition of care team related to patient condition, needs, goals of care, disposition.   HCPOA  NEXT OF KIN  sister, [name] [name].  Ms. Reder is unmarried and has a daughter who has legal guardian of [name] [name]'s mother.      SUMMARY OF RECOMMENDATIONS   At this point continue to treat the treatable Full scope/full code Time for outcomes Anticipate short-term rehab, would prefer [facility] facility Possible need for long-term care Agreeable to outpatient palliative services, provider choice offered, [facility]   Code Status/Advance Care Planning: Full code  Symptom Management:  Per hospitalist, no additional needs at this time.  Palliative Prophylaxis:  Frequent Pain Assessment, Oral Care, and Turn Reposition  Additional Recommendations (Limitations, Scope, Preferences): Full Scope Treatment  Psycho-social/Spiritual:  Desire for further Chaplaincy support:no Additional Recommendations: Caregiving  Support/Resources  Prognosis:  Unable to determine, based on outcomes.  3 to 6 months or less would not be surprising based on frequent, recurrent strokes, decreased functional status, poor by mouth intake.  Discharge Planning: To be determined, anticipate short-term rehab with the possible need for long-term care placement.       Primary Diagnoses: Present on Admission:  ICH (intracerebral hemorrhage) (HCC)   I have reviewed the medical record, interviewed the patient and family, and examined the patient. The following aspects are pertinent.  Past Medical  History:  Diagnosis Date   Acute ischemic left MCA stroke (HCC) 03/11/2017   Hyperlipidemia LDL goal < 70 07/24/2012   Hypertension    S/P CABG x 5 : LIMA-LAD, SVG-OM1-OM2, SVG-(Y)-DIAG-PDA. 07/25/12 07/28/2012   POSTOPERATIVE DIAGNOSIS: Severe 3-vessel coronary disease, status post  myocardial infarction.  PROCEDURE: Median sternotomy, extracorporeal circulation, coronary  artery bypass grafting x5 (left internal mammary artery to LAD,  sequential saphenous vein graft to obtuse marginal 1, obtuse marginal 2,  and posterior descending with Y graft to 1st diagonal), endoscopic vein  harvest, left thigh.     Seizures (HCC)    "years ago" (03/11/2017)   STEMI (ST elevation myocardial infarction), secondary to disease of RCA, with disease of LAD and diag. branch and chronic non dominant LCX occlusion for CABG  07/21/2012   Stroke [facility]) ?08/2012   "right sided weakness; speech isssues since" (03/11/2017)   Tobacco abuse 07/21/2012   Social History   Socioeconomic History   Marital status: Divorced    Spouse name: Not on file   Number of children: 1   Years of education: college   Highest  education level: Not on file  Occupational History   Occupation: retired  Tobacco Use   Smoking status: Every Day    Packs/day: 1.00    Years: 51.00    Additional pack years: 0.00    Total pack years: 51.00    Types: Cigarettes    Start date: 08/25/2012   Smokeless tobacco: Never  Vaping Use   Vaping Use: Never used  Substance and Sexual Activity   Alcohol use: No   Drug use: No   Sexual activity: Never  Other Topics Concern   Not on file  Social History Narrative   Not on file   Social Determinants of Health   Financial Resource Strain: Not on file  Food Insecurity: Not on file  Transportation Needs: Not on file  Physical Activity: Not on file  Stress: Not on file  Social Connections: Not on file   Family History  Problem Relation Age of Onset   Hypertension Mother    Stroke Brother         Died at age 27 secondary to MI   CAD Brother    Scheduled Meds:   stroke: early stages of recovery book   Does not apply Once   atorvastatin  40 mg Per Tube Daily   bisacodyl  10 mg Rectal Once   donepezil  5 mg Oral QHS   feeding supplement (PROSource TF20)  60 mL Per Tube Daily   heparin injection (subcutaneous)  5,000 Units Subcutaneous Q12H   levETIRAcetam  500 mg Per Tube BID   metoprolol tartrate  25 mg Per Tube BID   multivitamin with minerals  1 tablet Per Tube Daily   mouth rinse  15 mL Mouth Rinse 4 times per day   pantoprazole (PROTONIX) IV  40 mg Intravenous QHS   senna-docusate  1 tablet Oral BID   thiamine  100 mg Per Tube Daily   Continuous Infusions:  feeding supplement (OSMOLITE 1.2 CAL) 50 mL/hr at 07/28/22 1000   PRN Meds:.acetaminophen **OR** acetaminophen (TYLENOL) oral liquid 160 mg/5 mL **OR** acetaminophen, hydrALAZINE, labetalol, mouth rinse, mouth rinse, polyethylene glycol Medications Prior to Admission:  Prior to Admission medications   Medication Sig Start Date End Date Taking? Authorizing Provider  atorvastatin (LIPITOR) 80 MG tablet Take 80 mg by mouth daily.   Yes [provider]  donepezil (ARICEPT) 5 MG tablet Take 1 tablet (5 mg total) by mouth at bedtime. 03/21/17  Yes Angiulli, Mcarthur Rossetti, PA-C  ezetimibe (ZETIA) 10 MG tablet Take 1 tablet (10 mg total) by mouth daily. 06/25/22 12/22/22 Yes Gillis Santa, MD  metoprolol succinate (TOPROL-XL) 25 MG 24 hr tablet Take 1 tablet (25 mg total) by mouth daily. Take with or immediately following a meal. Patient taking differently: Take 25 mg by mouth daily. 06/24/22 12/21/22 Yes Gillis Santa, MD  Multiple Vitamin (MULTIVITAMIN WITH MINERALS) TABS tablet Take 1 tablet by mouth daily. 06/25/22 09/23/22 Yes Gillis Santa, MD  apixaban (ELIQUIS) 5 MG TABS tablet Take 1 tablet (5 mg total) by mouth 2 (two) times daily. Patient not taking: Reported on 07/24/2022 03/21/17   Charlton Amor, PA-C  Vitamin D,  Ergocalciferol, (DRISDOL) 1.25 MG (50000 UNIT) CAPS capsule Take 1 capsule (50,000 Units total) by mouth every 7 (seven) days. Patient not taking: Reported on 07/24/2022 06/30/22 09/28/22  Gillis Santa, MD   No Known Allergies Review of Systems  Unable to perform ROS: Acuity of condition    Physical Exam Vitals and nursing note reviewed.  Vital Signs: BP 133/65 (BP Location: Left Arm)   Pulse 81   Temp 98.3 F (36.8 C) (Oral)   Resp 20   Ht 5' (1.524 m)   Wt 38.2 kg   SpO2 97%   BMI 16.46 kg/m  Pain Scale: PAINAD   Pain Score: 0-No pain   SpO2: SpO2: 97 % O2 Device:SpO2: 97 % O2 Flow Rate: .O2 Flow Rate (L/min): 1 L/min  IO: Intake/output summary:  Intake/Output Summary (Last 24 hours) at 07/28/2022 1407 Last data filed at 07/28/2022 1000 Gross per 24 hour  Intake 1054.83 ml  Output 200 ml  Net 854.83 ml    LBM: Last BM Date :  (PTA) Baseline Weight: Weight: 38.2 kg Most recent weight: Weight: 38.2 kg     Palliative Assessment/Data:     Time In: 1340 Time Out: 1500 Time Total: 80 minutes  Greater than 50%  of this time was spent counseling and coordinating care related to the above assessment and plan.  Signed by: Katheran Awe, NP   Please contact Palliative Medicine Team phone at [phone] for questions and concerns.  For individual provider: See Loretha Stapler

## 2022-07-28 NOTE — Progress Notes (Addendum)
Speech language eval performed by Harlon Ditty  07/27/22 1015  SLP Visit Information  SLP Received On 07/27/22  SLP Time Calculation  SLP Start Time (ACUTE ONLY) 0947  SLP Stop Time (ACUTE ONLY) 1010  SLP Time Calculation (min) (ACUTE ONLY) 23 min  General Information  HPI 72 y.o. female presented to ED via EMS with AMS, seizure, new CVA. PMHx hypertension, hyperlipidemia, STEMI, CABG, tobacco use, seizures, 4 strokes with most recent last month (bedside swallow eval with recs for dysphagia 2/chopped, thin liquids). MRI: Acute right MCA distribution infarct underlies the petechial  hemorrhage in the right putamen. Tiny acute infarct in the superior left frontal lobe.2. Extensive chronic ischemic injury especially in the left cerebral hemisphere. Currently, patient on long-term EEG.  Prior Functional Status  Cognitive/Linguistic Baseline Baseline deficits  Baseline deficit details aphasia  Type of Home  (motel)   Lives With Significant other  Available Help at Discharge Friend(s);Available PRN/intermittently  Oral Motor/Sensory Function  Overall Oral Motor/Sensory Function Severe impairment  Facial ROM Reduced right;Reduced left  Facial Symmetry Abnormal symmetry right;Abnormal symmetry left  Facial Strength Reduced right;Reduced left  Lingual ROM  (UTA)  Cognition  Overall Cognitive Status Impaired/Different from baseline  Arousal/Alertness Lethargic  Orientation Level Oriented to person  Attention Focused  Focused Attention Appears intact  Problem Solving Impaired  Problem Solving Impairment Verbal basic;Functional basic  Auditory Comprehension  Overall Auditory Comprehension Impaired  Yes/No Questions X  Basic Biographical Questions 26-50% accurate  Commands X  One Step Basic Commands 25-49% accurate  Conversation Simple  Verbal Expression  Overall Verbal Expression Impaired  Initiation Impaired  Automatic Speech Name;Social Response  Level of Generative/Spontaneous  Verbalization Word  Repetition Impaired  Level of Impairment Word level  Naming Not tested  Written Expression  Dominant Hand Right  Motor Speech  Overall Motor Speech Impaired  Respiration WFL  Phonation Normal  Resonance Hypernasality  Articulation Impaired  Level of Impairment Word  Intelligibility Intelligibility reduced  Word 0-24% accurate  Assessment  Clinical Impression Statement (ACUTE ONLY) Pt demonstrates what appears to be new cognitive impairments overlapping with baseline aphasia and dysarthria. Pt with right hemiparesis at baseline and baseline aphasia from prior CVA. Also now with head turning left and weakness impacting left and right facial nerve. Prior SLP assessment on 06/22/22 reported word finding and paraphasic errors with abilty to self correct. Today pt able to make the initial sound of her name, but cannot repeat a full word. Responds yes and nods but is not accurate with Y/N. tries to follow commands at times. Pt will need total care at this point but does have potential to improve with longer term rehabilitation.  SLP Recommendation/Assessment Patient needs continued Speech Lanaguage Pathology Services  SLP Visit Diagnosis Aphasia (R47.01);Dysarthria and anarthria (R47.1);Cognitive communication deficit (R41.841)  Plan  Speech Therapy Frequency (ACUTE ONLY) min 2x/week  Duration 2 weeks  Individuals Consulted  Consulted and Agree with Results and Recommendations Patient;Family member/caregiver  SLP Evaluations  $ SLP Speech Visit 1 Visit  SLP Evaluations  $ SLP EVAL LANGUAGE/SOUND PRODUCTION 1 Procedure   Evaluation performed by Harlon Ditty

## 2022-07-28 NOTE — Progress Notes (Signed)
Nutrition Follow-up  DOCUMENTATION CODES:  Severe malnutrition in context of chronic illness, Underweight  INTERVENTION:  Continue tube feeding via cortrak: Osmolite 1.2 at 50 ml/h (1200 ml per day) Prosource TF20 60 ml 1x/d Provides 1520 kcal, 86 gm protein, 973 ml free water daily MVI with minerals daily Recommend increasing bowel regimen as no BM in >5 days Continue current diet as ordered per SLP. Will monitor intake for timing of TF rate adjustments.   NUTRITION DIAGNOSIS:   Severe Malnutrition related to chronic illness as evidenced by severe fat depletion, severe muscle depletion. - remains applicable  GOAL:   Patient will meet greater than or equal to 90% of their needs - progressing  MONITOR:   TF tolerance, Labs  REASON FOR ASSESSMENT:   Consult Enteral/tube feeding initiation and management  ASSESSMENT:   Pt with PMH of HTN, HLD, STEMI, CABG, tobacco use, seizures, Afib on Eliquis, and 4 strokes in the past with the most recent last month now admitted with R MCA moderate with L frontal punctate infarcts with R BG hemorrhagic conversion.  6/24 - cortrak placed 6/25 - SLP, DYS1 / nectar  Pt resting in bed at the time of assessment. Did not open eyes when name was called. Breakfast tray noted at bedside ~25% consumed. Took ticket and added up nutrition. Only took in 128kcal and 8g protein for breakfast. Would not recommend making any changes to TF regimen until pt is more alert and interested in PO intake.   Average Meal Intake: 6/26: 25% intake x 1 recorded meals  Nutritionally Relevant Medications: Scheduled Meds:  atorvastatin  40 mg Per Tube Daily   PROSource TF20  60 mL Per Tube Daily   multivitamin with minerals  1 tablet Per Tube Daily   pantoprazole IV  40 mg Intravenous QHS   senna-docusate  1 tablet Oral BID   thiamine  100 mg Per Tube Daily   Continuous Infusions:  feeding supplement (OSMOLITE 1.2 CAL) 50 mL/hr at 07/28/22 1000   Labs  Reviewed: CBG ranges from 113-132 mg/dL over the last 24 hours  NUTRITION - FOCUSED PHYSICAL EXAM: Flowsheet Row Most Recent Value  Orbital Region Severe depletion  Upper Arm Region Severe depletion  Thoracic and Lumbar Region Severe depletion  Buccal Region Severe depletion  Temple Region Severe depletion  Clavicle Bone Region Severe depletion  Clavicle and Acromion Bone Region Severe depletion  Scapular Bone Region Severe depletion  Dorsal Hand Severe depletion  Patellar Region Severe depletion  Anterior Thigh Region Severe depletion  Posterior Calf Region Severe depletion  Edema (RD Assessment) None  Hair Reviewed  Eyes Reviewed  Mouth Reviewed  [poor dentition, unable to see tongue]  Skin Reviewed  Nails Reviewed    Diet Order:   Diet Order             DIET - DYS 1 Room service appropriate? No; Fluid consistency: Nectar Thick  Diet effective now                   EDUCATION NEEDS:  Education needs have been addressed  Skin:  Skin Assessment: Reviewed RN Assessment  Last BM:  prior to admission  Height:  Ht Readings from Last 1 Encounters:  07/28/22 5' (1.524 m)    Weight:  Wt Readings from Last 1 Encounters:  07/27/22 38.2 kg    Ideal Body Weight:  45.5 kg  BMI:  Body mass index is 16.46 kg/m.  Estimated Nutritional Needs:  Kcal:  1400-1600 Protein:  70-85 grams Fluid:  >1.5 L/day    Greig Castilla, RD, LDN Clinical Dietitian RD pager # available in [facility]  After hours/weekend pager # available in [facility]

## 2022-07-28 NOTE — TOC Progression Note (Signed)
Transition of Care [facility]) - Progression Note    [name]: [id number] Date of Birth: [birth year]  Transition of [facility] CM/SW Contact  Lorri Frederick, LCSW Phone Number: 07/28/2022, 3:45 PM  Clinical Narrative:   Pt oriented x1.  CSW spoke with pt sister [name] [name] after palliative meeting, she confirmed plan to refer to SNF. Lives in [place], would like location in [place].  Permission given to sent out referral in hub. Medicare choice document left in room, [name] [name] will be back to get this.   Referral sent out in hub for SNF.    Expected Discharge Plan: Skilled Nursing Facility Barriers to Discharge: Continued Medical Work up, SNF Pending bed offer  Expected Discharge Plan and Services     Post Acute Care Choice: Skilled Nursing Facility Living arrangements for the past 2 months: Hotel/Motel                                       Social Determinants of Health (SDOH) Interventions SDOH Screenings   Tobacco Use: High Risk (07/28/2022)    Readmission Risk Interventions     No data to display

## 2022-07-29 ENCOUNTER — Inpatient Hospital Stay (HOSPITAL_COMMUNITY): Payer: 59

## 2022-07-29 ENCOUNTER — Encounter (HOSPITAL_COMMUNITY): Payer: Self-pay | Admitting: Neurology

## 2022-07-29 DIAGNOSIS — I5042 Chronic combined systolic (congestive) and diastolic (congestive) heart failure: Secondary | ICD-10-CM | POA: Diagnosis not present

## 2022-07-29 DIAGNOSIS — I472 Ventricular tachycardia, unspecified: Secondary | ICD-10-CM

## 2022-07-29 DIAGNOSIS — E46 Unspecified protein-calorie malnutrition: Secondary | ICD-10-CM

## 2022-07-29 DIAGNOSIS — I619 Nontraumatic intracerebral hemorrhage, unspecified: Secondary | ICD-10-CM | POA: Diagnosis not present

## 2022-07-29 DIAGNOSIS — Z951 Presence of aortocoronary bypass graft: Secondary | ICD-10-CM

## 2022-07-29 LAB — BASIC METABOLIC PANEL
Anion gap: 7 (ref 5–15)
BUN: 24 mg/dL — ABNORMAL HIGH (ref 8–23)
CO2: 24 mmol/L (ref 22–32)
Calcium: 8.5 mg/dL — ABNORMAL LOW (ref 8.9–10.3)
Chloride: 102 mmol/L (ref 98–111)
Creatinine, Ser: 0.73 mg/dL (ref 0.44–1.00)
GFR, Estimated: >60 mL/min (ref >60)
Glucose, Bld: 101 mg/dL — ABNORMAL HIGH (ref 70–99)
Potassium: 4.5 mmol/L (ref 3.5–5.1)
Sodium: 133 mmol/L — ABNORMAL LOW (ref 135–145)

## 2022-07-29 LAB — GLUCOSE, CAPILLARY
Glucose-Capillary: 135 mg/dL — ABNORMAL HIGH (ref 70–99)
Glucose-Capillary: 151 mg/dL — ABNORMAL HIGH (ref 70–99)
Glucose-Capillary: 122 mg/dL — ABNORMAL HIGH (ref 70–99)
Glucose-Capillary: 102 mg/dL — ABNORMAL HIGH (ref 70–99)
Glucose-Capillary: 102 mg/dL — ABNORMAL HIGH (ref 70–99)
Glucose-Capillary: 150 mg/dL — ABNORMAL HIGH (ref 70–99)

## 2022-07-29 LAB — MAGNESIUM: Magnesium: 1.7 mg/dL (ref 1.7–2.4)

## 2022-07-29 MED ORDER — AMIODARONE HCL 200 MG PO TABS: 400.0000 mg | ORAL_TABLET | Freq: Every day | ORAL | Status: DC

## 2022-07-29 MED ORDER — ASPIRIN 81 MG PO CHEW
81.0000 mg | CHEWABLE_TABLET | Freq: Every day | ORAL | Status: DC
  Administered 2022-07-29 – 2022-08-02 (×5): 81 mg via ORAL
  Filled 2022-07-29 (×5): qty 1

## 2022-07-29 MED ORDER — AMIODARONE HCL 200 MG PO TABS
400.0000 mg | ORAL_TABLET | Freq: Two times a day (BID) | ORAL | Status: AC
  Administered 2022-07-29 – 2022-08-04 (×13): 400 mg
  Filled 2022-07-29 (×13): qty 2

## 2022-07-29 MED ORDER — OSMOLITE 1.2 CAL PO LIQD
1000.0000 mL | ORAL | Status: DC
  Administered 2022-07-29 – 2022-08-01 (×4): 1000 mL
  Filled 2022-07-29: qty 1000

## 2022-07-29 NOTE — Progress Notes (Addendum)
Palliative: Ms. Terris is sitting up on the edge of the bed in her room.  She appears acutely ill and quite frail.  She will briefly make but not keep eye contact.  She is working with PT.  I believe that she can make her basic needs known given enough time.  We talked about going to short-term rehab.  She is agreeable.  We talked about time for outcomes.  I shared that I spoke with her sister, [name], yesterday and her sister is working closely with the team to make sure that Vianka has what she needs.  Reassurance provided.  Call to sister/HCPOA, [name].  We talk about Monet's morning with PT.  We talk about goal for short-term rehab.  We talk about poor by mouth intake.  We talk about PEG tube in detail, including, but not limited to risks and benefits such as aspiration, risk for wounds/bedsores, benefits of nutritional support while healing from stroke, possible temporary nature of PEG tube.  I share that Delsie will also have speech therapy at short term rehab.  [name] asks appropriate questions.  I share that Rejeana has already been experiencing malnutrition, poor by mouth intake as evidenced by BMI of 16.5.   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  She request time to think about, pray about what is next.  I share that if they elect to not place PEG tube and Lillianah is unable to take in adequate nutrition, hospice care would be the focus.  Conference with attending, bedside nursing staff, transition of care team related to patient condition, needs, goals of care, disposition.  Face-to-face meeting with [facility] team.  Plan: At this point continue to treat the treatable.  Patient historically would accept CPR/intubation, sister/HCPOA states that she may make a different choice.  Short-term rehab, need for long-term care would not be surprising. Considering PEG tube for temporary nutrition while recovering from acute stroke.  50 minutes  Lillia Carmel, NP Palliative  medicine team Team phone [phone] Greater than 50% of this time was spent counseling and coordinating care related to the above assessment and plan.

## 2022-07-29 NOTE — Consult Note (Addendum)
Cardiology Consultation   Patient ID: [name] [name] MRN: [id number]; DOB: Jun 26, 1950  Admit date: 07/23/2022 Date of Consult: 07/29/2022  PCP:  Preston Fleeting, MD   [facility] Providers Cardiologist:  Nanetta Batty, MD        Patient Profile:   [name] [name] is a 72 y.o. female with a hx of atrial fib on eliquis, STEMI and hx CABG, strokes, dementia who is being seen 07/29/2022 for the evaluation of NSVT at the request of Dr .Roda Shutters.    History of Present Illness:   [name] [name] last seen by cardiology in hospital 11/2021.  She has hx CABG X 5 2014  LIMA-LAD, SVG-OM1-OM2, SVG-(Y)-DIAG-PDA after presenting with STEMI.  Hx of CVA, Seizures, HLD, HTN.  Most recent echo 06/23/22 with EF 40-45%, global hypokinesis, could not eval. LV diastolic function. RV systolic function mildly reduced but size in normal.  Mild MR, TR mod.  Similar echo 11/2021 and Echo 03/2017 with EF 55-60% and no RWMA.  No cardiac caths since CABG.no nuc studies, she does not follow up in office.   Carotid with occlusion of LICA (new from dopplers 2019) and 1-49% stenosis of RICA.  After CVA in 2014 she had loop placed and atrial fib detected 2015 and she was asymptomatic.  She was placed on eliqis then.   She was off Eliquis 2019 and had lt middle cerebral artery infarction.  Her loop was no longer functioning but she did not wish to have removed.  Episode of syncope in 2023 with COVID and atrial fib.    Pt lives in a motel and EMS called when family found her unresponsive and with seizure activity.  She had multiple seizures en route.   Found to have Rt basal ganglia ICH very small. MRI  Acute right MCA distribution infarct underlies the petechial hemorrhage in the right putamen. Tiny acute infarct in the superior left frontal lobe. 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  Eliquis stopped. Has not passed swallowing study.  Pt is current smoker.  Palliative care has seen.   She wakes when touched but does  not talk, she did shake her head to if she was cold.   Last pm she had 1 min run on 07/28/22 of WCT  also several other short bursts of WCT. Some have P waves before QRS She had similar episodes in 06/2022 that are atrial fib RVR.    EKG:  The EKG was personally reviewed and demonstrates:  SR with RBBB ( RBBB dates back to 2015 at least)  she was in a fib/flutter in May with RBBB and now her lateral T waves inverted new from 06/2019.   Telemetry:  Telemetry was personally reviewed and demonstrates:  SR with WCT ? Atrial flutter/fib vs VT  no mention if pt was symptomatic.  On admit hs troponin was 13 CK 1833 post seizures LDL 24 HDL 51 Tg 304  Today Na 133 K+ 4.5 BUn 24 Cr 0.73  Recent Hgb 12.2, WBC 7.3 plts 215.  BP 131/71 P 83 R 28 afebrile.   Past Medical History:  Diagnosis Date   Acute ischemic left MCA stroke (HCC) 03/11/2017   Hyperlipidemia LDL goal < 70 07/24/2012   Hypertension    S/P CABG x 5 : LIMA-LAD, SVG-OM1-OM2, SVG-(Y)-DIAG-PDA. 07/25/12 07/28/2012   POSTOPERATIVE DIAGNOSIS: Severe 3-vessel coronary disease, status post  myocardial infarction.  PROCEDURE: Median sternotomy, extracorporeal circulation, coronary  artery bypass grafting x5 (left internal mammary artery to LAD,  sequential saphenous vein graft to obtuse marginal 1, obtuse marginal 2,  and posterior descending with Y graft to 1st diagonal), endoscopic vein  harvest, left thigh.     Seizures (HCC)    "years ago" (03/11/2017)   STEMI (ST elevation myocardial infarction), secondary to disease of RCA, with disease of LAD and diag. branch and chronic non dominant LCX occlusion for CABG  07/21/2012   Stroke [facility]) ?08/2012   "right sided weakness; speech isssues since" (03/11/2017)   Tobacco abuse 07/21/2012    Past Surgical History:  Procedure Laterality Date   CARDIAC CATHETERIZATION     CESAREAN SECTION  X 1   CORONARY ARTERY BYPASS GRAFT N/A 07/25/2012   Procedure: CORONARY ARTERY BYPASS GRAFTING (CABG) times five with  Left  Endoscopic Saphenous Vein Harvest and Left Internal  Mammary ;  Surgeon: Loreli Slot, MD;  Location: [facility] OR;  Service: Open Heart Surgery;  Laterality: N/A;   DILATION AND CURETTAGE OF UTERUS     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   LEFT HEART CATHETERIZATION WITH CORONARY ANGIOGRAM N/A 07/21/2012   Procedure: LEFT HEART CATHETERIZATION WITH CORONARY ANGIOGRAM;  Surgeon: [name], MD;  Location: [facility] CATH LAB;  Service: Cardiovascular;  Laterality: N/A;   LOOP RECORDER IMPLANT  08-15-2012   Medtronic LinQ implanted by Dr Johney Frame for cryptogenic stroke   TEE WITHOUT CARDIOVERSION N/A 08/15/2012   Procedure: TRANSESOPHAGEAL ECHOCARDIOGRAM (TEE);  Surgeon: Wendall Stade, MD;  Location: [facility] ENDOSCOPY;  Service: Cardiovascular;  Laterality: N/A;   TUBAL LIGATION       Home Medications:  Prior to Admission medications   Medication Sig Start Date End Date Taking? Authorizing Provider  atorvastatin (LIPITOR) 80 MG tablet Take 80 mg by mouth daily.   Yes [provider]  donepezil (ARICEPT) 5 MG tablet Take 1 tablet (5 mg total) by mouth at bedtime. 03/21/17  Yes Angiulli, Mcarthur Rossetti, PA-C  ezetimibe (ZETIA) 10 MG tablet Take 1 tablet (10 mg total) by mouth daily. 06/25/22 12/22/22 Yes Gillis Santa, MD  metoprolol succinate (TOPROL-XL) 25 MG 24 hr tablet Take 1 tablet (25 mg total) by mouth daily. Take with or immediately following a meal. Patient taking differently: Take 25 mg by mouth daily. 06/24/22 12/21/22 Yes Gillis Santa, MD  Multiple Vitamin (MULTIVITAMIN WITH MINERALS) TABS tablet Take 1 tablet by mouth daily. 06/25/22 09/23/22 Yes Gillis Santa, MD  apixaban (ELIQUIS) 5 MG TABS tablet Take 1 tablet (5 mg total) by mouth 2 (two) times daily. Patient not taking: Reported on 07/24/2022 03/21/17   Charlton Amor, PA-C  Vitamin D, Ergocalciferol, (DRISDOL) 1.25 MG (50000 UNIT) CAPS capsule Take 1 capsule (50,000 Units total) by mouth every 7 (seven) days. Patient not  taking: Reported on 07/24/2022 06/30/22 09/28/22  Gillis Santa, MD    Inpatient Medications: Scheduled Meds:   stroke: early stages of recovery book   Does not apply Once   aspirin  81 mg Oral Daily   atorvastatin  40 mg Per Tube Daily   donepezil  5 mg Oral QHS   feeding supplement (OSMOLITE 1.2 CAL)  1,000 mL Per Tube Q24H   feeding supplement (PROSource TF20)  60 mL Per Tube Daily   free water  50 mL Per Tube Q4H   heparin injection (subcutaneous)  5,000 Units Subcutaneous Q12H   levETIRAcetam  500 mg Per Tube BID   metoprolol tartrate  25 mg Per Tube BID   multivitamin with minerals  1 tablet Per Tube Daily  mouth rinse  15 mL Mouth Rinse 4 times per day   pantoprazole (PROTONIX) IV  40 mg Intravenous QHS   senna-docusate  1 tablet Oral BID   thiamine  100 mg Per Tube Daily   Continuous Infusions:  PRN Meds: acetaminophen **OR** acetaminophen (TYLENOL) oral liquid 160 mg/5 mL **OR** acetaminophen, hydrALAZINE, labetalol, mouth rinse, mouth rinse, polyethylene glycol  Allergies:   No Known Allergies  Social History:   Social History   Socioeconomic History   Marital status: Divorced    Spouse name: Not on file   Number of children: 1   Years of education: college   Highest education level: Not on file  Occupational History   Occupation: retired  Tobacco Use   Smoking status: Every Day    Packs/day: 1.00    Years: 51.00    Additional pack years: 0.00    Total pack years: 51.00    Types: Cigarettes    Start date: 08/25/2012   Smokeless tobacco: Never  Vaping Use   Vaping Use: Never used  Substance and Sexual Activity   Alcohol use: No   Drug use: No   Sexual activity: Never  Other Topics Concern   Not on file  Social History Narrative   Not on file   Social Determinants of Health   Financial Resource Strain: Not on file  Food Insecurity: Not on file  Transportation Needs: Not on file  Physical Activity: Not on file  Stress: Not on file  Social  Connections: Not on file  Intimate Partner Violence: Not on file    Family History:    Family History  Problem Relation Age of Onset   Hypertension Mother    Stroke Brother        Died at age 54 secondary to MI   CAD Brother      ROS:  Please see the history of present illness.  General:no colds or fevers, no weight changes Skin:no rashes or ulcers HEENT:no blurred vision, no congestion CV:see HPI PUL:see HPI + tobacco use GI:no diarrhea constipation or melena, no indigestion GU:no hematuria, no dysuria MS:no joint pain, no claudication Neuro:no syncope, no lightheadedness, seizures Endo:no diabetes, no thyroid disease  All other ROS reviewed and negative.     Physical Exam/Data:   Vitals:   07/29/22 0000 07/29/22 0311 07/29/22 0806 07/29/22 1112  BP:  111/64 139/78 131/71  Pulse:  100 85 96  Resp: (!) 22 (!) 23 (!) 22 19  Temp:  99.3 F (37.4 C) 98.5 F (36.9 C) 98.7 F (37.1 C)  TempSrc:  Axillary Axillary Axillary  SpO2:  100% 98%   Weight:      Height:        Intake/Output Summary (Last 24 hours) at 07/29/2022 1506 Last data filed at 07/29/2022 1426 Gross per 24 hour  Intake 410 ml  Output 150 ml  Net 260 ml      07/27/2022    7:38 AM 06/22/2022    9:41 AM 11/04/2021   11:21 AM  Last 3 Weights  Weight (lbs) 84 lb 4.8 oz 78 lb 7.7 oz 76 lb 11.2 oz  Weight (kg) 38.238 kg 35.6 kg 34.791 kg     Body mass index is 16.46 kg/m.  General: frail female, in no acute distress HEENT: normal Neck: no JVD Vascular: No carotid bruits on lt; Distal pulses 2+ bilaterally Cardiac:  normal S1, S2; RRR; no murmur gallup or rub  Lungs:  clear to auscultation bilaterally, no wheezing, rhonchi or  rales  Abd: soft, nontender, no hepatomegaly  Ext: no edema Musculoskeletal:  No deformities, BUE and BLE strength normal and equal Skin: warm and dry  Neuro:  opens eyes wihen name called and did nod when asked if she was cold but no speech  Psych:  Normal to flat affect     Relevant CV Studies: ECHO 06/23/22 IMPRESSIONS     1. Left ventricular ejection fraction, by estimation, is 40 to 45%. The  left ventricle has mildly decreased function. The left ventricle  demonstrates global hypokinesis. Left ventricular diastolic function could  not be evaluated.   2. Right ventricular systolic function is mildly reduced. The right  ventricular size is normal. There is normal pulmonary artery systolic  pressure.   3. The mitral valve is normal in structure. Mild mitral valve  regurgitation.   4. Tricuspid valve regurgitation is moderate.   5. The aortic valve is tricuspid. Aortic valve regurgitation is trivial.  Unable to assess aortic valve gradient.   6. The inferior vena cava is normal in size with <50% respiratory  variability, suggesting right atrial pressure of 8 mmHg.   Comparison(s): A prior study was performed on 11/04/2021. No significant  change from prior study.   FINDINGS   Left Ventricle: Left ventricular ejection fraction, by estimation, is 40  to 45%. The left ventricle has mildly decreased function. The left  ventricle demonstrates global hypokinesis. The left ventricular internal  cavity size was normal in size. There is   borderline left ventricular hypertrophy. Left ventricular diastolic  function could not be evaluated.   Right Ventricle: The right ventricular size is normal. No increase in  right ventricular wall thickness. Right ventricular systolic function is  mildly reduced. There is normal pulmonary artery systolic pressure. The  tricuspid regurgitant velocity is 2.42  m/s, and with an assumed right atrial pressure of 8 mmHg, the estimated  right ventricular systolic pressure is 31.4 mmHg.   Left Atrium: Left atrial size was not well visualized.   Right Atrium: Right atrial size was not well visualized.   Pericardium: There is no evidence of pericardial effusion.   Mitral Valve: The mitral valve is normal in structure.  Mild mitral valve  regurgitation.   Tricuspid Valve: The tricuspid valve is normal in structure. Tricuspid  valve regurgitation is moderate.   Aortic Valve: The aortic valve is tricuspid. Aortic valve regurgitation is  trivial. Unable to assess aortic valve gradient.   Pulmonic Valve: The pulmonic valve was not well visualized. Pulmonic valve  regurgitation is not visualized. No evidence of pulmonic stenosis.   Aorta: The aortic root is normal in size and structure.   Pulmonary Artery: The pulmonary artery is not well seen.   Venous: The inferior vena cava is normal in size with less than 50%  respiratory variability, suggesting right atrial pressure of 8 mmHg.   IAS/Shunts: No atrial level shunt detected by color flow Doppler.     Echo 11/05/22  IMPRESSIONS     1. Left ventricular ejection fraction, by estimation, is 40 to 45%. The  left ventricle has mildly decreased function. The left ventricle  demonstrates global hypokinesis. Left ventricular diastolic parameters are  indeterminate.   2. Right ventricular systolic function is mildly reduced. The right  ventricular size is mildly enlarged. There is normal pulmonary artery  systolic pressure. The estimated right ventricular systolic pressure is  27.7 mmHg.   3. Left atrial size was mildly dilated.   4. Right atrial size was mildly  dilated.   5. The mitral valve is normal in structure. Trivial mitral valve  regurgitation. No evidence of mitral stenosis.   6. Tricuspid valve regurgitation is mild to moderate.   7. The aortic valve is tricuspid. There is moderate calcification of the  aortic valve. Aortic valve regurgitation is trivial.   8. The inferior vena cava is normal in size with <50% respiratory  variability, suggesting right atrial pressure of 8 mmHg.   9. The patient was in atrial fibrillation.    Laboratory Data:  High Sensitivity Troponin:   Recent Labs  Lab 07/23/22 1649  TROPONINIHS 13      Chemistry Recent Labs  Lab 07/26/22 1744 07/27/22 0630 07/27/22 1759 07/28/22 0417 07/29/22 1137  NA  --  137  --  137 133*  K  --  3.2*  --  3.7 4.5  CL  --  107  --  106 102  CO2  --  20*  --  22 24  GLUCOSE  --  123*  --  130* 101*  BUN  --  19  --  22 24*  CREATININE  --  0.68  --  0.73 0.73  CALCIUM  --  8.7*  --  8.3* 8.5*  MG 1.6* 1.7 1.7  --   --   GFRNONAA  --  >60  --  >60 >60  ANIONGAP  --  10  --  9 7    Recent Labs  Lab 07/23/22 1202 07/24/22 0453  PROT 7.5 6.7  ALBUMIN 3.6 3.2*  AST 36 72*  ALT 21 30  ALKPHOS 74 61  BILITOT 0.7 1.2   Lipids  Recent Labs  Lab 07/24/22 0453  CHOL 136  TRIG 304*  HDL 51  LDLCALC 24  CHOLHDL 2.7    Hematology Recent Labs  Lab 07/26/22 0029 07/27/22 0630 07/28/22 0417  WBC 7.9 6.9 7.3  RBC 4.23 4.20 3.98  HGB 13.1 12.6 12.2  HCT 40.0 37.8 36.0  MCV 94.6 90.0 90.5  MCH 31.0 30.0 30.7  MCHC 32.8 33.3 33.9  RDW 13.9 14.0 14.0  PLT 193 216 215   Thyroid No results for input(s): "TSH", "FREET4" in the last 168 hours.  BNPNo results for input(s): "BNP", "PROBNP" in the last 168 hours.  DDimer No results for input(s): "DDIMER" in the last 168 hours.   Radiology/Studies:  CT HEAD WO CONTRAST ( )  Result Date: 07/29/2022 CLINICAL DATA:  72 year old female with right MCA infarct, petechial hemorrhage in the basal ganglia. Subsequent encounter. EXAM: CT HEAD WITHOUT CONTRAST TECHNIQUE: Contiguous axial images were obtained from the base of the skull through the vertex without intravenous contrast. RADIATION DOSE REDUCTION: This exam was performed according to the departmental dose-optimization program which includes automated exposure control, adjustment of the mA and/or kV according to patient size and/or use of iterative reconstruction technique. COMPARISON:  Head CT 07/25/2022, brain MRI 07/24/2022 and earlier. FINDINGS: Brain: Small but confluent hemorrhage in the right lentiform, 15 mm series 7, image 22) has  not significantly changed in size or configuration since 07/23/2022. Surrounding heterogeneous cytotoxic edema redemonstrated. Mild leftward midline shift of 3-4 mm, is only 1 mm greater since presentation and appears more related to the infarct edema. Mild mass effect on the right lateral ventricle. Contralateral left lateral ex vacuo ventriculomegaly with confluent, multifocal left hemisphere chronic encephalomalacia. No new intracranial hemorrhage. Basilar cisterns remain normal. No new cortically based infarct identified. Vascular: Calcified atherosclerosis at the skull base. Skull: Intact. Sinuses/Orbits: Visualized  paranasal sinuses and mastoids are stable and well aerated. Other: Left nasoenteric tube partially visible. No acute orbit or scalp soft tissue finding. IMPRESSION: 1. Expected appearance of patchy Right MCA infarct and stable petechial hemorrhage in the right basal ganglia. 2. Mild leftward midline shift is 1 mm greater since presentation and appears more related to the infarct edema. 3. No new intracranial abnormality. Electronically Signed   By: Odessa Fleming M.D.   On: 07/29/2022 07:14   DG Abd Portable 1V  Result Date: 07/26/2022 CLINICAL DATA:  Feeding tube placement EXAM: PORTABLE ABDOMEN - 1 VIEW COMPARISON:  KUB 07/23/2022 FINDINGS: The enteric catheter tip projects over left abdomen, likely in the stomach. There is mild gaseous distention of the bowel without evidence of mechanical obstruction there is no definite free intraperitoneal air, within the confines of supine technique. There is retrocardiac opacity which appears slightly worsened compared to the prior study. Median sternotomy wires and a cardiac loop recorder device are again noted. There is no acute osseous abnormality. IMPRESSION: 1. Enteric catheter projects over the left hemiabdomen likely in the stomach. 2. Retrocardiac opacity appears slightly worsened since the prior study and may reflect atelectasis or developing  infection. Electronically Signed   By: Lesia Hausen M.D.   On: 07/26/2022 11:17     Assessment and Plan:   WCT k+ stable, check Mg+ was 1.7 on the 25th. MD to eval.  Similar to rhythm in 06/2022 ? A fib or flutter  at times ST with RBBB the long run appears to be VT  will check Mg+ and add amiodarone 400 BID per tube for a week then 400 daily   Stroke with hemorrhagic conversion - right MCA moderate and left frontal punctate infarcts with right BG hemorrhagic conversion, likely due to A-fib even on Eliquis unsure of compliance.   PAF and was in atrial fib in May but on admit was in SR (last EKG until this admit of SR was 2019)  (atrial fib dx in 2015 with loop recorder and placed on eliquis)  Loop no longer functioning (TSH 06/2022 0.414  free T4 0.96)  on ASA alone with ICH  CHA2DS2VASc of 6 - prior CVA off eliquis CAD with CABG 2014 LIMA-LAD, SVG-OM1-OM2, SVG-(Y)-DIAG-PDA   post STEMI - no angina since, no nuc studies or caths since CABG Occlusion of LICA noted 06/2022 RICA with 1-49% stenosis. CM of 40-45% since 11/2021. Chronic RBBB HLD to resume statin lipitor 40  HTN BP 131/71  lopressor 25 BID Tobacco use/nutrition/seizures/dementia per primary team   Risk Assessment/Risk Scores:          CHA2DS2-VASc Score = 6   This indicates a 9.7% annual risk of stroke. The patient's score is based upon: CHF History: 0 HTN History: 1 Diabetes History: 0 Stroke History: 2 Vascular Disease History: 1 Age Score: 1 Gender Score: 1         For questions or updates, please contact [facility] Please consult [email or web address] for contact info under    Signed, Nada Boozer, NP  07/29/2022 3:06 PM  Personally seen and examined. Agree with APP above with the following comments:  Briefly 72 yo F with hx of PAF (on eliquis with unclear compliance, prior CVAs and expressive aphasia and secondary seizures), Prior STEMI and CABG LIMA-LAD, SVG-OM1-OM2, SVG-(Y)-DIAG-PDA in 2014; HFmrEF and  known RBBB; tobacco abuse and underlying vascular dementia.  She was found 6/21 with seizures and found to have a new MCA stroke with hemorrhagic conversion Prior  to consultation patient has a fast WCT rhythm as ~ 220 BPM.  She has had slower rates of WCT that are slower in nature.   Patient notes that she is not cold.  She is fine.  Was not able to further engage in conversation  Exam notable for  Thin, frail female with chronic wounds on her arms and legs. Regular rhythm without heart murmur.  No tachypnea.    Labs notable for K 4.5.  A MG was ordered by our team and is pending EKG SR with RBBB and lateral TWI Tele: SR with one episode of likely VT; some PAF with abberant conduction  Would recommend  - her estimated mortality at one year is high; she is not a candidate for ICD - checking Mg, MG goal of 2, K goal of 4 - Due to her recent stroke with hemorrhagic and her comorbidity, would defer ischemic evaluation - will continue amiodarone; if worsening WCT would start IV amiodarone - will continue metoprolol per tube - agree with palliative care support; I do not suspect she would gain meaningful recovery from CPR - continue statin for now based on present GOC  Riley Lam, MD FASE [facility] Cardiologist [facility]  [facility]  [address] [place], [address] [place], [place] [address] [phone]  4:14 PM

## 2022-07-29 NOTE — Progress Notes (Addendum)
STROKE TEAM PROGRESS NOTE   INTERVAL HISTORY Patient is seen in her room with no family at the bedside.  She had a 1 minute run of V. tach which self terminated last night, and cardiology has been consulted.  She has otherwise been hemodynamically stable, and her neurological exam remains stable.  Discussions about potential PEG tube placement are ongoing, and calorie count was started.  Vitals:   07/29/22 0000 07/29/22 0311 07/29/22 0806 07/29/22 1112  BP:  111/64 139/78 131/71  Pulse:  100 85 96  Resp: (!) 22 (!) 23 (!) 28 19  Temp:  99.3 F (37.4 C) 98.5 F (36.9 C) 98.7 F (37.1 C)  TempSrc:  Axillary Axillary Axillary  SpO2:  100% 98%   Weight:      Height:       CBC:  Recent Labs  Lab 07/27/22 0630 07/28/22 0417  WBC 6.9 7.3  NEUTROABS 5.1 5.3  HGB 12.6 12.2  HCT 37.8 36.0  MCV 90.0 90.5  PLT 216 215    Basic Metabolic Panel:  Recent Labs  Lab 07/27/22 0630 07/27/22 1759 07/28/22 0417 07/29/22 1137  NA 137  --  137 133*  K 3.2*  --  3.7 4.5  CL 107  --  106 102  CO2 20*  --  22 24  GLUCOSE 123*  --  130* 101*  BUN 19  --  22 24*  CREATININE 0.68  --  0.73 0.73  CALCIUM 8.7*  --  8.3* 8.5*  MG 1.7 1.7  --   --   PHOS 2.9 2.5  --   --     Lipid Panel:  Recent Labs  Lab 07/24/22 0453  CHOL 136  TRIG 304*  HDL 51  CHOLHDL 2.7  VLDL 61*  LDLCALC 24    HgbA1c:  Recent Labs  Lab 07/24/22 0453  HGBA1C 4.9    Urine Drug Screen: No results for input(s): "LABOPIA", "COCAINSCRNUR", "LABBENZ", "AMPHETMU", "THCU", "LABBARB" in the last 168 hours.  Alcohol Level  Recent Labs  Lab 07/23/22 1203  ETH <10     IMAGING past 24 hours CT HEAD WO CONTRAST ( )  Result Date: 07/29/2022 CLINICAL DATA:  72 year old female with right MCA infarct, petechial hemorrhage in the basal ganglia. Subsequent encounter. EXAM: CT HEAD WITHOUT CONTRAST TECHNIQUE: Contiguous axial images were obtained from the base of the skull through the vertex without intravenous  contrast. RADIATION DOSE REDUCTION: This exam was performed according to the departmental dose-optimization program which includes automated exposure control, adjustment of the mA and/or kV according to patient size and/or use of iterative reconstruction technique. COMPARISON:  Head CT 07/25/2022, brain MRI 07/24/2022 and earlier. FINDINGS: Brain: Small but confluent hemorrhage in the right lentiform, 15 mm series 7, image 22) has not significantly changed in size or configuration since 07/23/2022. Surrounding heterogeneous cytotoxic edema redemonstrated. Mild leftward midline shift of 3-4 mm, is only 1 mm greater since presentation and appears more related to the infarct edema. Mild mass effect on the right lateral ventricle. Contralateral left lateral ex vacuo ventriculomegaly with confluent, multifocal left hemisphere chronic encephalomalacia. No new intracranial hemorrhage. Basilar cisterns remain normal. No new cortically based infarct identified. Vascular: Calcified atherosclerosis at the skull base. Skull: Intact. Sinuses/Orbits: Visualized paranasal sinuses and mastoids are stable and well aerated. Other: Left nasoenteric tube partially visible. No acute orbit or scalp soft tissue finding. IMPRESSION: 1. Expected appearance of patchy Right MCA infarct and stable petechial hemorrhage in the right basal ganglia. 2. Mild  leftward midline shift is 1 mm greater since presentation and appears more related to the infarct edema. 3. No new intracranial abnormality. Electronically Signed   By: Odessa Fleming M.D.   On: 07/29/2022 07:14    PHYSICAL EXAM Constitutional: Cachetic, ill-appearing patient in no acute distress Cardiovascular: Normal rate and regular rhythm.  Respiratory: Effort normal and regular on room air  GI: Soft.  No distension. There is no tenderness.  Skin: WDI MSK: Diffuse muscle wasting   Neuro: Patient lying bed, eyes open, does not respond to name but intermittently follows midline commands.   She will make mumbling vocalizations with occasional single words such as yes or okay. Global aphasia No significant gaze preference today, moving eyes bilaterally.   Left facial droop. Tongue protrusion not cooperative.   Will move all 4 extremities spontaneously but not to command. Sensation, coordination and gait not tested.   ASSESSMENT/PLAN [name] is a 72 y.o. female with history of HTN, HLD, STEMI, CABG, tobacco use, seizures, 4 strokes with most recent last month, Afib on Eliquis who presented 6/21 AM after being found with rhythmic jerking movements of the bilateral upper extremities.  She was found to have a right MCA stroke with hemorrhagic conversion.  Stroke with hemorrhagic conversion - right MCA moderate and left frontal punctate infarcts with right BG hemorrhagic conversion, likely due to A-fib even on Eliquis CT head Acute 1.1 x 0.9 x 1.2 cm intraparenchymal hemorrhage in the right basal ganglia. Possible trace intraventricular extension of hemorrhage of the right lateral ventricle. Remote left MCA and PCA territory infarct CTA head & neck Similar occlusion of a right proximal M2 MCA branch. Similar occlusion of the left ICA in the neck with poor opacification of small left MCA and ACA vessels. Similar occluded right vertebral artery. Similar severe stenosis of the left vertebral artery origin Repeat CT 6/21: Stable hematoma MRI  Acute right MCA distribution infarct underlies the petechial hemorrhage in the right putamen. Tiny acute infarct in the superior left frontal lobe. 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  Repeat CT 6/27 patchy right MCA infarct and stable petechial hemorrhage in right basal ganglia with 1 mm midline shift 2D Echo 06/23/22 EF 40-45% LDL 24 HgbA1c 4.9 UDS pending VTE prophylaxis - SCDs Eliquis (apixaban) daily prior to admission, now on aspirin 81 mg daily Therapy recommendations: SNF Disposition:   pending, palliative care on board, continue full code for now  Seizure Presented with unresponsiveness with bilateral upper extremity jerking Received Versed for seizure activity EEG 6/21: suggestive of cortical dysfunction, likely secondary to underlying ICH.  EEG 6/22: suggestive of cortical dysfunction arising from right frontotemporal region, likely secondary to underlying ICH. No seizures.  LTM EEG discontinued Continue Keppra 500mg  BID  History of stroke 08/2012 had left MCA stroke, put on loop recorder 2015 found to have A-fib on loop recorder, started on Eliquis 03/2017 admitted for difficulty speaking.  CT no acute abnormality.  CTA head and neck right ICA 80% stenosis, left VA origin severe stenosis and right VA occlusion, as well as significant intracranial stenosis.  Carotid Doppler negative.  MRI showed left MCA infarct and old large left MCA and PCA infarcts.  LDL 53, A1c 5.6.  2D echo EF normal range.  Continue on Eliquis given noncompliance PTA.  Has residual mild expressive aphasia 06/2022 again admitted for worsening aphasia.  CT head and neck found new left ICA occlusion.  Right ICA 75% stenosis, unchanged  vertebral arteries as above.  MRI showed left MCA small high convexity infarct.  EF 40 to 45%.  Continue on Eliquis.  Ventricular tachycardia On 6/26, patient had a 1 minute episode of V. tach which self terminated Cardiology consulted, appreciate input  PAF Found in 2015 by loop recorder On Eliquis PTA Currently AC on hold due to hemorrhagic conversion Repeat CT was stable, so aspirin 81 mg daily started Resumed home metoprolol  Hypertension Home meds:  Toprol-XL 25mg  PRN Labetalol and Hydralazine IV  On metoprolol 25 twice daily BP goal < 160 Long-term BP goal normotensive  Hyperlipidemia Home meds:  Lipitor 80mg , Zetia 10mg  LDL 24, goal < 70 Decrease Lipitor to 40 given low LDL Continue statin at discharge  Dysphagia Dysphagia 1 diet with nectar thick  liquid On TF->changed to nocturnal TF Encourage po intake Try to avoid PEG Calorie count 6/27 - 6/28  Tobacco abuse Current smoker Smoking cessation counseling will be provided  Other Stroke Risk Factors Advanced Age >/= 51  CAD/STEMI status post CABG Family hx stroke (brother)  Other Active Problems ? Medication non-compliance Dementia -restarted home Aricept  Hospital day # 6   Pt seen by Neuro NP/APP and later by MD. Note/plan to be edited by MD as needed.    Cortney E Ernestina Columbia , MSN, AGACNP-BC [facility] See Amion for schedule and pager information 07/29/2022 1:19 PM  ATTENDING NOTE: I reviewed above note and agree with the assessment and plan. Pt was seen and examined.   No family at bedside.  Patient still lethargic, however open eyes on voice, able to tell me her name and age, not orientated to place. Severe dysarthria, limited language output. Follow limited simple commands. Still has left facial droop, left upper extremity drift with increased muscle tone, right upper extremity against gravity with increased muscle tone. Bilateral lower extremity in flexion position.  Palliative care on board, sister requested full code.  Plan to send patients to SNF, however does not want PEG tube.  Currently patient on nocturnal tube feeding, encourage p.o. intake, will do calorie count to see if core track can be removed.  Continue aspirin and statin.  For detailed assessment and plan, please refer to above/below as I have made changes wherever appropriate.   Marvel Plan, MD PhD Stroke Neurology 07/29/2022 7:04 PM    To contact Stroke Continuity provider, please refer to [email or web address]. After hours, contact General Neurology

## 2022-07-29 NOTE — Progress Notes (Signed)
Brief Nutrition Support Note  Reviewed chart and noted that TF were adjusted to nocturnal feeds by Neurology. Adjusted order as current is set to expire today. Also started kcal count as it is highly unlikely pt will be able to meet her needs PO. Discussed with RN and hung envelope on door. For additional details, see last full nutrition assessment on 6/26.  New TF orders are as follows: Nocturnal tube feeding via cortrak: Osmolite 1.2 at 75 ml/h x 16h (1200 ml per day) Prosource TF20 60 ml 1x/d Provides 1520 kcal, 86 gm protein, 973 ml free water daily  Greig Castilla, RD, LDN Clinical Dietitian RD pager # available in AMION  After hours/weekend pager # available in [facility]

## 2022-07-29 NOTE — Progress Notes (Signed)
Physical Therapy Treatment Patient Details Name: [name] [name] MRN: [id number] DOB: [birth year] Today's Date: 07/29/2022   History of Present Illness 72 y.o. female who presents 07/23/22 after being found in the fetal position with rhythmic jerking movements of the bilateral upper extremities. 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 Requires +2 total assist for all mobility, although can sit EOB with min assist. She tends to have head turned left and laterally flexed to left, RUE with scapula adducted, shoulder in extension, elbow/hand in flexion, and LEs in flexion. PROM to all extremities, trunk, and cervical spine while in sitting ~20 minutes. Returned to rt sidelying with prevalon boots reapplied. Bed turned for pt to be able to see TV and pt attending to TV at end of session.    Recommendations for follow up therapy are one component of a multi-disciplinary discharge planning process, led by the attending physician.  Recommendations may be updated based on patient status, additional functional criteria and insurance authorization.  Follow Up Recommendations  Can patient physically be transported by private vehicle: No    Assistance Recommended at Discharge Frequent or constant Supervision/Assistance  Patient can return home with the following A lot of help with bathing/dressing/bathroom;Assistance with feeding;Direct supervision/assist for medications management;Direct supervision/assist for financial management;Assist for transportation;Help with stairs or ramp for entrance   Equipment Recommendations  None recommended by PT    Recommendations for Other Services       Precautions / Restrictions  Precautions Precautions: Fall Precaution Comments: seizure, Cortrak, concerns for pressure wound due to positioning Restrictions Weight Bearing Restrictions: No     Mobility  Bed Mobility Overal bed mobility: Needs Assistance Bed Mobility: Rolling, Sidelying to Sit, Sit to Sidelying Rolling: Total assist, +2 for physical assistance Sidelying to sit: Total assist, +2 for physical assistance     Sit to sidelying: Total assist, +2 for physical assistance General bed mobility comments: pt on left side on arrival; end of session rolled onto her right side with total assist with pt resisting, however once on her right side she relaxed    Transfers                   General transfer comment: not appropriate    Ambulation/Gait                   Stairs             Wheelchair Mobility    Modified Rankin (Stroke Patients Only) Modified Rankin (Stroke Patients Only) Pre-Morbid Rankin Score: Moderate disability Modified Rankin: Severe disability     Balance Overall balance assessment: Needs assistance Sitting-balance support: Feet supported Sitting balance-Leahy Scale: Poor Sitting balance - Comments: minA for sitting balance, R scapular assist for protraction, cervical support provided to maintain midline and work on rt rotation; bil trunk rotation with max +2; leaning to prop on each elbow with +2 max; LLE held in IR with stretch into ER provided; pt able to kick LLE on command and states she is trying when asked to kick RLE Postural control: Right lateral lean  Cognition Arousal/Alertness: Awake/alert Behavior During Therapy: Flat affect Overall Cognitive Status: Impaired/Different from baseline                                 General Comments: able to follow one step commands ~75% of the time; speech garbled however could understand "Steward Drone" "I'm trying"        Exercises General Exercises  - Lower Extremity Ankle Circles/Pumps: PROM, Both, 5 reps, Seated Long Arc Quad: AROM, Left, 5 reps, Seated Other Exercises Other Exercises: Cervical stretching into R lateral flexion and cervical rotation Other Exercises: Gentle R knee extension stretch; hip ER stretch    General Comments        Pertinent Vitals/Pain Pain Assessment Pain Assessment: Faces Faces Pain Scale: No hurt    Home Living                          Prior Function            PT Goals (current goals can now be found in the care plan section) Acute Rehab PT Goals Patient Stated Goal: pt unable Time For Goal Achievement: 08/07/22 Potential to Achieve Goals: Poor Progress towards PT goals: Progressing toward goals    Frequency    Min 2X/week      PT Plan Current plan remains appropriate    Co-evaluation PT/OT/SLP Co-Evaluation/Treatment: Yes Reason for Co-Treatment: Complexity of the patient's impairments (multi-system involvement) PT goals addressed during session: Mobility/safety with mobility;Balance;Strengthening/ROM        AM-PAC PT "6 Clicks" Mobility   Outcome Measure  Help needed turning from your back to your side while in a flat bed without using bedrails?: Total Help needed moving from lying on your back to sitting on the side of a flat bed without using bedrails?: Total Help needed moving to and from a bed to a chair (including a wheelchair)?: Total Help needed standing up from a chair using your arms (e.g., wheelchair or bedside chair)?: Total Help needed to walk in hospital room?: Total Help needed climbing 3-5 steps with a railing? : Total 6 Click Score: 6    End of Session   Activity Tolerance: Patient tolerated treatment well Patient left: in bed;with call bell/phone within reach;with bed alarm set   PT Visit Diagnosis: Difficulty in walking, not elsewhere classified (R26.2);Other symptoms and signs involving the nervous system (R29.898)     Time:  4098-1191 PT Time Calculation (min) (ACUTE ONLY): 25 min  Charges:  $Therapeutic Activity: 8-22 mins                      [name] [name], PT Acute Rehabilitation Services  Office [phone]    [name] [name] 07/29/2022, 9:23 AM

## 2022-07-29 NOTE — Plan of Care (Signed)
A/Ox1 to self, and on room air. No complaints of pain this shift. No neurological changes noted during the shift. Total care provided. Started nocturnal feeds this shift. Had 1 minute long run of v-tach. Attempted to call code but patient spontaneously converted back to sinus tach, and then to NSR. Neuro notified, and card consult placed. Had BM x2 overnight. CT brain ordered for this morning.    Problem: Education: Goal: Knowledge of disease or condition will improve Outcome: Progressing Goal: Knowledge of secondary prevention will improve (MUST DOCUMENT ALL) Outcome: Progressing Goal: Knowledge of patient specific risk factors will improve Loraine Leriche N/A or DELETE if not current risk factor) Outcome: Progressing   Problem: Intracerebral Hemorrhage Tissue Perfusion: Goal: Complications of Intracerebral Hemorrhage will be minimized Outcome: Progressing   Problem: Health Behavior/Discharge Planning: Goal: Goals will be collaboratively established with patient/family Outcome: Progressing   Problem: Self-Care: Goal: Ability to participate in self-care as condition permits will improve Outcome: Progressing Goal: Verbalization of feelings and concerns over difficulty with self-care will improve Outcome: Progressing Goal: Ability to communicate needs accurately will improve Outcome: Progressing   Problem: Nutrition: Goal: Risk of aspiration will decrease Outcome: Progressing Goal: Dietary intake will improve Outcome: Progressing   Problem: Education: Goal: Knowledge of General Education information will improve Description: Including pain rating scale, medication(s)/side effects and non-pharmacologic comfort measures Outcome: Progressing   Problem: Health Behavior/Discharge Planning: Goal: Ability to manage health-related needs will improve Outcome: Progressing   Problem: Clinical Measurements: Goal: Ability to maintain clinical measurements within normal limits will improve Outcome:  Progressing Goal: Will remain free from infection Outcome: Progressing Goal: Diagnostic test results will improve Outcome: Progressing Goal: Respiratory complications will improve Outcome: Progressing Goal: Cardiovascular complication will be avoided Outcome: Progressing   Problem: Activity: Goal: Risk for activity intolerance will decrease Outcome: Progressing   Problem: Nutrition: Goal: Adequate nutrition will be maintained Outcome: Progressing   Problem: Coping: Goal: Level of anxiety will decrease Outcome: Progressing   Problem: Elimination: Goal: Will not experience complications related to bowel motility Outcome: Progressing Goal: Will not experience complications related to urinary retention Outcome: Progressing   Problem: Pain Managment: Goal: General experience of comfort will improve Outcome: Progressing   Problem: Safety: Goal: Ability to remain free from injury will improve Outcome: Progressing   Problem: Skin Integrity: Goal: Risk for impaired skin integrity will decrease Outcome: Progressing

## 2022-07-29 NOTE — Progress Notes (Signed)
Occupational Therapy Treatment Patient Details Name: [name] [name] MRN: [id number] DOB: 1950/04/08 Today's Date: 07/29/2022   History of present illness 72 y.o. female who presents 07/23/22 after being found in the fetal position with rhythmic jerking movements of the bilateral upper extremities. 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 Patient requiring assistance with head support and min assist for sitting balance. Patient able to get wash cloth to face to wash face while on EOB but required HOH to complete. Trunk exercises performed while seated on EOB to address tone. Patient positioned on right at end of session to address tone and attention to right. Patient will benefit from continued inpatient follow up therapy, <3 hours/day to address bed mobility and sitting balance. Acute OT to continue to follow.    Recommendations for follow up therapy are one component of a multi-disciplinary discharge planning process, led by the attending physician.  Recommendations may be updated based on patient status, additional functional criteria and insurance authorization.    Assistance Recommended at Discharge Frequent or constant Supervision/Assistance  Patient can return home with the following  Two people to help with walking and/or transfers;Two people to help with bathing/dressing/bathroom   Equipment Recommendations  Wheelchair (measurements OT);Wheelchair cushion (measurements OT);Hospital bed;Other (comment) (hoyer)    Recommendations for Other Services      Precautions / Restrictions Precautions Precautions: Fall Precaution Comments: seizure, Cortrak, concerns for pressure wound due to  positioning Restrictions Weight Bearing Restrictions: No       Mobility Bed Mobility Overal bed mobility: Needs Assistance Bed Mobility: Rolling, Sidelying to Sit, Sit to Sidelying Rolling: Total assist, +2 for physical assistance Sidelying to sit: Total assist, +2 for physical assistance     Sit to sidelying: Total assist, +2 for physical assistance General bed mobility comments: patient in fetal position on left upon entry and positioned on right at end of session to address tone    Transfers                   General transfer comment: not appropriate     Balance Overall balance assessment: Needs assistance Sitting-balance support: Feet supported Sitting balance-Leahy Scale: Poor Sitting balance - Comments: assistance with head alignment and min assist for sitting balance. Trunk rotation and extention exercises performed to address posture and balance Postural control: Right lateral lean                                 ADL either performed or assessed with clinical judgement   ADL Overall ADL's : Needs assistance/impaired     Grooming: Wash/dry face;Maximal assistance;Sitting Grooming Details (indicate cue type and reason): with HOH to initiate and assistance to complete                               General ADL Comments: patient attempting to assist with grooming seated on EOB    Extremity/Trunk Assessment              Vision       Perception     Praxis  Cognition Arousal/Alertness: Awake/alert Behavior During Therapy: Flat affect Overall Cognitive Status: Impaired/Different from baseline                                 General Comments: following one step commands 75% of time, often responding, "I'm trying"        Exercises      Shoulder Instructions       General Comments      Pertinent Vitals/ Pain       Pain Assessment Pain Assessment: Faces Faces Pain Scale: No hurt Pain  Intervention(s): Monitored during session  Home Living                                          Prior Functioning/Environment              Frequency  Min 2X/week        Progress Toward Goals  OT Goals(current goals can now be found in the care plan section)  Progress towards OT goals: Progressing toward goals  Acute Rehab OT Goals OT Goal Formulation: Patient unable to participate in goal setting Time For Goal Achievement: 08/09/22 Potential to Achieve Goals: Good ADL Goals Pt Will Perform Grooming: with max assist;bed level Additional ADL Goal #1: Pt will tolerate EOB sitting max (A) as precursor to adls. Additional ADL Goal #2: pt will demonstrate full trunk extension in supine for 2 minutes  Plan Discharge plan remains appropriate    Co-evaluation    PT/OT/SLP Co-Evaluation/Treatment: Yes Reason for Co-Treatment: Complexity of the patient's impairments (multi-system involvement) PT goals addressed during session: Mobility/safety with mobility;Balance;Strengthening/ROM OT goals addressed during session: ADL's and self-care      AM-PAC OT "6 Clicks" Daily Activity     Outcome Measure   Help from another person eating meals?: Total Help from another person taking care of personal grooming?: Total Help from another person toileting, which includes using toliet, bedpan, or urinal?: Total Help from another person bathing (including washing, rinsing, drying)?: Total Help from another person to put on and taking off regular upper body clothing?: Total Help from another person to put on and taking off regular lower body clothing?: Total 6 Click Score: 6    End of Session    OT Visit Diagnosis: Muscle weakness (generalized) (M62.81)   Activity Tolerance Patient tolerated treatment well   Patient Left in bed;with call bell/phone within reach;with bed alarm set   Nurse Communication Mobility status;Precautions        Time: 1610-9604 OT  Time Calculation (min): 26 min  Charges: OT General Charges $OT Visit: 1 Visit OT Treatments $Therapeutic Activity: 8-22 mins  Alfonse Flavors, OTA Acute Rehabilitation Services  Office [phone]   Dewain Penning 07/29/2022, 11:33 AM

## 2022-07-30 DIAGNOSIS — R569 Unspecified convulsions: Secondary | ICD-10-CM | POA: Diagnosis not present

## 2022-07-30 DIAGNOSIS — I5042 Chronic combined systolic (congestive) and diastolic (congestive) heart failure: Secondary | ICD-10-CM | POA: Diagnosis not present

## 2022-07-30 DIAGNOSIS — I480 Paroxysmal atrial fibrillation: Secondary | ICD-10-CM | POA: Diagnosis not present

## 2022-07-30 DIAGNOSIS — I619 Nontraumatic intracerebral hemorrhage, unspecified: Secondary | ICD-10-CM | POA: Diagnosis not present

## 2022-07-30 DIAGNOSIS — R1312 Dysphagia, oropharyngeal phase: Secondary | ICD-10-CM | POA: Diagnosis not present

## 2022-07-30 DIAGNOSIS — I472 Ventricular tachycardia, unspecified: Secondary | ICD-10-CM | POA: Diagnosis not present

## 2022-07-30 LAB — BASIC METABOLIC PANEL
Anion gap: 9 (ref 5–15)
BUN: 26 mg/dL — ABNORMAL HIGH (ref 8–23)
CO2: 25 mmol/L (ref 22–32)
Calcium: 8.6 mg/dL — ABNORMAL LOW (ref 8.9–10.3)
Chloride: 100 mmol/L (ref 98–111)
Creatinine, Ser: 0.75 mg/dL (ref 0.44–1.00)
GFR, Estimated: >60 mL/min (ref >60)
Glucose, Bld: 150 mg/dL — ABNORMAL HIGH (ref 70–99)
Potassium: 4.8 mmol/L (ref 3.5–5.1)
Sodium: 134 mmol/L — ABNORMAL LOW (ref 135–145)

## 2022-07-30 LAB — GLUCOSE, CAPILLARY
Glucose-Capillary: 113 mg/dL — ABNORMAL HIGH (ref 70–99)
Glucose-Capillary: 143 mg/dL — ABNORMAL HIGH (ref 70–99)
Glucose-Capillary: 149 mg/dL — ABNORMAL HIGH (ref 70–99)
Glucose-Capillary: 152 mg/dL — ABNORMAL HIGH (ref 70–99)
Glucose-Capillary: 154 mg/dL — ABNORMAL HIGH (ref 70–99)
Glucose-Capillary: 170 mg/dL — ABNORMAL HIGH (ref 70–99)

## 2022-07-30 MED ORDER — MAGNESIUM SULFATE 2 GM/50ML IV SOLN
2.0000 g | Freq: Once | INTRAVENOUS | Status: AC
  Administered 2022-07-30: 2 g via INTRAVENOUS
  Filled 2022-07-30: qty 50

## 2022-07-30 NOTE — Progress Notes (Signed)
Progress Note  Patient Name: BEENA ELEBY Date of Encounter: 07/30/2022 Primary Cardiologist: Nanetta Batty, MD   Subjective   Overnight started calorie count. Patient response yes to knowing Dr. Allyson Sabal, no to palpitations, no to pain.  She says that she does good.   Vital Signs    Vitals:   07/29/22 2020 07/29/22 2358 07/30/22 0353 07/30/22 0722  BP:  128/74 (!) 143/72 136/70  Pulse:  83 86 92  Resp: (!) 21 20 (!) 22 19  Temp:  98.6 F (37 C) 98.1 F (36.7 C) 99.6 F (37.6 C)  TempSrc:  Axillary Axillary Oral  SpO2:  100% 100% 100%  Weight:      Height:        Intake/Output Summary (Last 24 hours) at 07/30/2022 0812 Last data filed at 07/30/2022 0520 Gross per 24 hour  Intake 2733.75 ml  Output 100 ml  Net 2633.75 ml   Filed Weights   07/27/22 0738  Weight: 38.2 kg    Physical Exam   GEN: Chronically ill appearing and cachetic Neck: No JVD Cardiac: RRR, no murmurs, rubs, or gallops.  Respiratory: Occasional expiratory wheeze GI: Soft, nontender, non-distended  MS: No edema, well dressed leg wounds, no further arm dressings  Labs   Telemetry: no further [facility]   Chemistry Recent Labs  Lab 07/23/22 1202 07/23/22 1649 07/24/22 0453 07/26/22 0029 07/28/22 0417 07/29/22 1137 07/30/22 0634  NA 140   < > 137   < > 137 133* 134*  K 5.2*   < > 3.7   < > 3.7 4.5 4.8  CL 108   < > 107   < > 106 102 100  CO2 22   < > 18*   < > 22 24 25   GLUCOSE 97   < > 86   < > 130* 101* 150*  BUN 16   < > 12   < > 22 24* 26*  CREATININE 1.04*   < > 0.88   < > 0.73 0.73 0.75  CALCIUM 9.6   < > 8.9   < > 8.3* 8.5* 8.6*  PROT 7.5  --  6.7  --   --   --   --   ALBUMIN 3.6  --  3.2*  --   --   --   --   AST 36  --  72*  --   --   --   --   ALT 21  --  30  --   --   --   --   ALKPHOS 74  --  61  --   --   --   --   BILITOT 0.7  --  1.2  --   --   --   --   GFRNONAA 56*   < > >60   < > >60 >60 >60  ANIONGAP 10   < > 12   < > 9 7 9    < > = values in this interval not  displayed.     Hematology Recent Labs  Lab 07/26/22 0029 07/27/22 0630 07/28/22 0417  WBC 7.9 6.9 7.3  RBC 4.23 4.20 3.98  HGB 13.1 12.6 12.2  HCT 40.0 37.8 36.0  MCV 94.6 90.0 90.5  MCH 31.0 30.0 30.7  MCHC 32.8 33.3 33.9  RDW 13.9 14.0 14.0  PLT 193 216 215    Cardiac EnzymesNo results for input(s): "TROPONINI" in the last 168 hours. No results for input(s): "TROPIPOC" in the  last 168 hours.   BNPNo results for input(s): "BNP", "PROBNP" in the last 168 hours.   DDimer No results for input(s): "DDIMER" in the last 168 hours.   Cardiac Studies   Cardiac Studies & Procedures       ECHOCARDIOGRAM  ECHOCARDIOGRAM COMPLETE 06/23/2022  Narrative ECHOCARDIOGRAM REPORT    Patient Name:   HAZELL COMELLA Date of Exam: 06/23/2022 Medical Rec #:  [id number]       Height:       60.0 in Accession #:    [id number]      Weight:       78.5 lb Date of Birth:  12-Nov-1950       BSA:          1.254 m Patient Age:    72 years        BP:           138/88 mmHg Patient Gender: F               HR:           69 bpm. Exam Location:  [facility]  Procedure: 2D Echo, Cardiac Doppler and Color Doppler  Indications:     Stroke  History:         Patient has prior history of Echocardiogram examinations, most recent 11/04/2021. CHF, CAD and Previous Myocardial Infarction, Prior CABG, Stroke, Arrythmias:Atrial Fibrillation, Signs/Symptoms:Syncope; Risk Factors:Hypertension, Dyslipidemia and Current Smoker.  Sonographer:     Mikki Harbor Referring Phys:  [id number] Brien Few NIU Diagnosing Phys: Yvonne Kendall MD   Sonographer Comments: Technically challenging study due to limited acoustic windows and no apical window. Image acquisition challenging due to patient body habitus. IMPRESSIONS   1. Left ventricular ejection fraction, by estimation, is 40 to 45%. The left ventricle has mildly decreased function. The left ventricle demonstrates global hypokinesis. Left ventricular diastolic function could  not be evaluated. 2. Right ventricular systolic function is mildly reduced. The right ventricular size is normal. There is normal pulmonary artery systolic pressure. 3. The mitral valve is normal in structure. Mild mitral valve regurgitation. 4. Tricuspid valve regurgitation is moderate. 5. The aortic valve is tricuspid. Aortic valve regurgitation is trivial. Unable to assess aortic valve gradient. 6. The inferior vena cava is normal in size with <50% respiratory variability, suggesting right atrial pressure of 8 mmHg.  Comparison(s): A prior study was performed on 11/04/2021. No significant change from prior study.  FINDINGS Left Ventricle: Left ventricular ejection fraction, by estimation, is 40 to 45%. The left ventricle has mildly decreased function. The left ventricle demonstrates global hypokinesis. The left ventricular internal cavity size was normal in size. There is borderline left ventricular hypertrophy. Left ventricular diastolic function could not be evaluated.  Right Ventricle: The right ventricular size is normal. No increase in right ventricular wall thickness. Right ventricular systolic function is mildly reduced. There is normal pulmonary artery systolic pressure. The tricuspid regurgitant velocity is 2.42 m/s, and with an assumed right atrial pressure of 8 mmHg, the estimated right ventricular systolic pressure is 31.4 mmHg.  Left Atrium: Left atrial size was not well visualized.  Right Atrium: Right atrial size was not well visualized.  Pericardium: There is no evidence of pericardial effusion.  Mitral Valve: The mitral valve is normal in structure. Mild mitral valve regurgitation.  Tricuspid Valve: The tricuspid valve is normal in structure. Tricuspid valve regurgitation is moderate.  Aortic Valve: The aortic valve is tricuspid. Aortic valve regurgitation is trivial. Unable to assess aortic valve gradient.  Pulmonic Valve: The pulmonic valve was not well visualized.  Pulmonic valve regurgitation is not visualized. No evidence of pulmonic stenosis.  Aorta: The aortic root is normal in size and structure.  Pulmonary Artery: The pulmonary artery is not well seen.  Venous: The inferior vena cava is normal in size with less than 50% respiratory variability, suggesting right atrial pressure of 8 mmHg.  IAS/Shunts: No atrial level shunt detected by color flow Doppler.   LEFT VENTRICLE PLAX 2D LVIDd:         3.70 cm LVIDs:         2.50 cm LV PW:         1.00 cm LV IVS:        1.00 cm LVOT diam:     1.90 cm LV SV:         33 LV SV Index:   27 LVOT Area:     2.84 cm   LEFT ATRIUM         Index LA diam:    3.50 cm 2.79 cm/m AORTIC VALVE LVOT Vmax:   64.70 cm/s LVOT Vmean:  35.700 cm/s LVOT VTI:    0.118 m  AORTA Ao Root diam: 3.00 cm  TRICUSPID VALVE TR Peak grad:   23.4 mmHg TR Vmax:        242.00 cm/s  SHUNTS Systemic VTI:  0.12 m Systemic Diam: 1.90 cm  Yvonne Kendall MD Electronically signed by Yvonne Kendall MD Signature Date/Time: 06/23/2022/6:01:26 PM    Final             Assessment & Plan   Wide Complex tachycardia - both NSVT and PAF with aberrant conduction on 07/29/22 - no further WCT - not an ICD candidate (see 6/27 note) - repleting Mg today, goal of 2 - continue amiodarone 400 mg BID X 10 days then 400 mg daily - DOAC when able due to stroke and AF (had hemorrhagic conversion) - I do not have evidence she would have good functional recovery if CPR was pursued; recommended DNR status  CAD s/p CABG - continue statin; zetia at DC - continue succinate  Chronic HFrEF HTN - continue succinate;  RBBB - stable, has had some aberrant   Protein calorie malnutrition - appreciate nutrition recs  Dementia - as per primary  Tobacco abuse - recommend cessation  Transitioning to weekend team; if no further ectopy 6/29 may sign off   For questions or updates, please contact [facility] Please  consult [email or web address] for contact info under Cardiology/STEMI.      Riley Lam, MD FASE [facility] Cardiologist [facility]  [address] [place], [address] [place], [place] [address] [phone]  8:12 AM

## 2022-07-30 NOTE — Progress Notes (Signed)
Calorie Count Note  48-hour calorie count ordered.  Diet: DYS 1, nectar Supplements: magic cup on all trays  Pt resting with her eyes closed at the time of assessment. No family present at bedside. Reviewed intake tickets and pt took in 7 spoonfuls of food and 3 spoonfuls of liquid yesterday. When talking with NT yesterday about intake, she reports that pt refuses PO turns away from spoon. Pt has been on nocturnal feeds x 2 days now and has had no improvement in daytime appetite.   Relayed information to attending and PMT.  Breakfast: 2 bites of puree pineapple Lunch: refused all Dinner: 3 bites of puree chicken, 2 bites of puree rice, 3 spoonfuls of tea Supplements: none  Estimated Nutritional Needs:  Kcal:  1400-1600 Protein:  70-85 grams Fluid:  >1.5 L/day  Total intake: Minimal kcal/protein taken in 6/27, no nutrition to calculate.  NUTRITION DIAGNOSIS:  Severe Malnutrition related to chronic illness as evidenced by severe fat depletion, severe muscle depletion. - remains applicable  GOAL:  Patient will meet greater than or equal to 90% of their needs - progressing  INTERVENTION:  Continue tube feeding via cortrak: Osmolite 1.2 at 75 ml/h x 16h (1200 ml per day) Prosource TF20 60 ml 1x/d Provides 1520 kcal, 86 gm protein, 973 ml free water daily MVI with minerals daily Continue Kcal count thru the weekend   Greig Castilla, RD, LDN Clinical Dietitian RD pager # available in AMION  After hours/weekend pager # available in [facility]

## 2022-07-30 NOTE — Plan of Care (Signed)
  Problem: Nutrition: Goal: Adequate nutrition will be maintained Outcome: Progressing   

## 2022-07-30 NOTE — Progress Notes (Signed)
STROKE TEAM PROGRESS NOTE   INTERVAL HISTORY No family is at the bedside. Pt lethargic but open eyes on voice, and able to maintain eye open, more interactive today. Mumbling words, intangible. On nocturnal tube feeding but po intake during the day not quite adequate.   Vitals:   07/30/22 0722 07/30/22 0723 07/30/22 1139 07/30/22 1516  BP: 136/70  125/69 125/72  Pulse: 92  87 82  Resp: 19  19 18   Temp: 99.6 F (37.6 C)  97.9 F (36.6 C) 98 F (36.7 C)  TempSrc: Oral  Oral Oral  SpO2: 100%  100% 100%  Weight:  35.4 kg    Height:       CBC:  Recent Labs  Lab 07/27/22 0630 07/28/22 0417  WBC 6.9 7.3  NEUTROABS 5.1 5.3  HGB 12.6 12.2  HCT 37.8 36.0  MCV 90.0 90.5  PLT 216 215   Basic Metabolic Panel:  Recent Labs  Lab 07/27/22 0630 07/27/22 1759 07/28/22 0417 07/29/22 1137 07/29/22 1541 07/30/22 0634  NA 137  --    < > 133*  --  134*  K 3.2*  --    < > 4.5  --  4.8  CL 107  --    < > 102  --  100  CO2 20*  --    < > 24  --  25  GLUCOSE 123*  --    < > 101*  --  150*  BUN 19  --    < > 24*  --  26*  CREATININE 0.68  --    < > 0.73  --  0.75  CALCIUM 8.7*  --    < > 8.5*  --  8.6*  MG 1.7 1.7  --   --  1.7  --   PHOS 2.9 2.5  --   --   --   --    < > = values in this interval not displayed.   Lipid Panel:  Recent Labs  Lab 07/24/22 0453  CHOL 136  TRIG 304*  HDL 51  CHOLHDL 2.7  VLDL 61*  LDLCALC 24   HgbA1c:  Recent Labs  Lab 07/24/22 0453  HGBA1C 4.9    IMAGING past 24 hours No results found.  PHYSICAL EXAM Constitutional: Cachetic, ill-appearing patient in no acute distress Cardiovascular: Normal rate and regular rhythm.  Respiratory: Effort normal and regular on room air  GI: Soft.  No distension. There is no tenderness.  Skin: WDI MSK: Diffuse muscle wasting   Neuro: Patient lying bed, eyes open, does not respond to name but intermittently follows midline commands.  She will make mumbling vocalizations with occasional single words such  as yes or okay. Global aphasia No significant gaze preference today, moving eyes bilaterally.   Left facial droop. Tongue protrusion not cooperative.   Will move all 4 extremities spontaneously but not to command. Sensation, coordination and gait not tested.   ASSESSMENT/PLAN [name] is a 72 y.o. female with history of HTN, HLD, STEMI, CABG, tobacco use, seizures, 4 strokes with most recent last month, Afib on Eliquis who presented 6/21 AM after being found with rhythmic jerking movements of the bilateral upper extremities.  She was found to have a right MCA stroke with hemorrhagic conversion.  Stroke with hemorrhagic conversion - right MCA moderate and left frontal punctate infarcts with right BG hemorrhagic conversion, likely due to A-fib even on Eliquis CT head Acute 1.1 x 0.9 x 1.2 cm intraparenchymal hemorrhage  in the right basal ganglia. Possible trace intraventricular extension of hemorrhage of the right lateral ventricle. Remote left MCA and PCA territory infarct CTA head & neck Similar occlusion of a right proximal M2 MCA branch. Similar occlusion of the left ICA in the neck with poor opacification of small left MCA and ACA vessels. Similar occluded right vertebral artery. Similar severe stenosis of the left vertebral artery origin Repeat CT 6/21: Stable hematoma MRI  Acute right MCA distribution infarct underlies the petechial hemorrhage in the right putamen. Tiny acute infarct in the superior left frontal lobe. 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  Repeat CT 6/27 patchy right MCA infarct and stable petechial hemorrhage in right basal ganglia with 1 mm midline shift 2D Echo 06/23/22 EF 40-45% LDL 24 HgbA1c 4.9 UDS pending VTE prophylaxis - SCDs Eliquis (apixaban) daily prior to admission, now on aspirin 81 mg daily Therapy recommendations: SNF Disposition:  pending, palliative care on board, continue full code for  now  Seizure Presented with unresponsiveness with bilateral upper extremity jerking Received Versed for seizure activity EEG 6/21: suggestive of cortical dysfunction, likely secondary to underlying ICH.  EEG 6/22: suggestive of cortical dysfunction arising from right frontotemporal region, likely secondary to underlying ICH. No seizures.  LTM EEG discontinued Continue Keppra 500mg  BID  History of stroke 08/2012 had left MCA stroke, put on loop recorder 2015 found to have A-fib on loop recorder, started on Eliquis 03/2017 admitted for difficulty speaking.  CT no acute abnormality.  CTA head and neck right ICA 80% stenosis, left VA origin severe stenosis and right VA occlusion, as well as significant intracranial stenosis.  Carotid Doppler negative.  MRI showed left MCA infarct and old large left MCA and PCA infarcts.  LDL 53, A1c 5.6.  2D echo EF normal range.  Continue on Eliquis given noncompliance PTA.  Has residual mild expressive aphasia 06/2022 again admitted for worsening aphasia.  CT head and neck found new left ICA occlusion.  Right ICA 75% stenosis, unchanged vertebral arteries as above.  MRI showed left MCA small high convexity infarct.  EF 40 to 45%.  Continue on Eliquis.  Ventricular tachycardia On 6/26, patient had a 1 minute episode of V. tach which self terminated Cardiology consulted, appreciate input Put on amiodarione Continue metoprolol Not candidate for ICD, defer ischemic evaluation for now per card  PAF Found in 2015 by loop recorder On Eliquis PTA Currently AC on hold due to hemorrhagic conversion Repeat CT was stable, aspirin 81 mg daily started Resumed home metoprolol  Hypertension Home meds:  Toprol-XL 25mg  PRN Labetalol and Hydralazine IV  On metoprolol 25 twice daily BP goal < 160 Long-term BP goal normotensive  Hyperlipidemia Home meds:  Lipitor 80mg , Zetia 10mg  LDL 24, goal < 70 Decrease Lipitor to 40 given low LDL Continue statin at  discharge  Dysphagia Dysphagia 1 diet with nectar thick liquid On TF->changed to nocturnal TF Encourage po intake - not adequate yet Try to avoid PEG Calorie count 6/27 - 6/28 Dietitian on board  Tobacco abuse Current smoker Smoking cessation counseling will be provided  Other Stroke Risk Factors Advanced Age >/= 50  CAD/STEMI status post CABG Family hx stroke (brother)  Other Active Problems ? Medication non-compliance Dementia -restarted home Aricept  Hospital day # 7   Marvel Plan, MD PhD Stroke Neurology 07/30/2022 4:21 PM    To contact Stroke Continuity provider, please refer to [email or web address]. After hours, contact General Neurology

## 2022-07-31 DIAGNOSIS — I618 Other nontraumatic intracerebral hemorrhage: Secondary | ICD-10-CM

## 2022-07-31 DIAGNOSIS — I5022 Chronic systolic (congestive) heart failure: Secondary | ICD-10-CM

## 2022-07-31 LAB — GLUCOSE, CAPILLARY: Glucose-Capillary: 161 mg/dL — ABNORMAL HIGH (ref 70–99)

## 2022-07-31 NOTE — Plan of Care (Signed)
  Problem: Nutrition: Goal: Adequate nutrition will be maintained Outcome: Progressing   Problem: Elimination: Goal: Will not experience complications related to bowel motility Outcome: Progressing Goal: Will not experience complications related to urinary retention Outcome: Progressing   

## 2022-07-31 NOTE — Progress Notes (Signed)
STROKE TEAM PROGRESS NOTE   INTERVAL HISTORY No family is at the bedside. Pt  , more interactive today. Mumbling words, intangible. On nocturnal tube feeding but po intake during the day not quite adequate.  Patient encouraged to eat more Neurological exam unchanged.  Vital signs stable. Vitals:   07/31/22 0435 07/31/22 0832 07/31/22 0904 07/31/22 1231  BP: 138/68 129/70 134/65 112/63  Pulse: 85  66 66  Resp: (!) 21 20 20  (!) 26  Temp: 98.2 F (36.8 C) 99.6 F (37.6 C) 98.7 F (37.1 C) 98.3 F (36.8 C)  TempSrc:  Oral Oral Axillary  SpO2: 100% 99% 95% 100%  Weight:      Height:       CBC:  Recent Labs  Lab 07/27/22 0630 07/28/22 0417  WBC 6.9 7.3  NEUTROABS 5.1 5.3  HGB 12.6 12.2  HCT 37.8 36.0  MCV 90.0 90.5  PLT 216 215   Basic Metabolic Panel:  Recent Labs  Lab 07/27/22 0630 07/27/22 1759 07/28/22 0417 07/29/22 1137 07/29/22 1541 07/30/22 0634  NA 137  --    < > 133*  --  134*  K 3.2*  --    < > 4.5  --  4.8  CL 107  --    < > 102  --  100  CO2 20*  --    < > 24  --  25  GLUCOSE 123*  --    < > 101*  --  150*  BUN 19  --    < > 24*  --  26*  CREATININE 0.68  --    < > 0.73  --  0.75  CALCIUM 8.7*  --    < > 8.5*  --  8.6*  MG 1.7 1.7  --   --  1.7  --   PHOS 2.9 2.5  --   --   --   --    < > = values in this interval not displayed.   Lipid Panel:  No results for input(s): "CHOL", "TRIG", "HDL", "CHOLHDL", "VLDL", "LDLCALC" in the last 168 hours.  HgbA1c:  No results for input(s): "HGBA1C" in the last 168 hours.   IMAGING past 24 hours No results found.  PHYSICAL EXAM Constitutional: Cachetic, ill-appearing patient in no acute distress Cardiovascular: Normal rate and regular rhythm.  Respiratory: Effort normal and regular on room air  GI: Soft.  No distension. There is no tenderness.  Skin: WDI MSK: Diffuse muscle wasting   Neuro: Patient lying bed, eyes open, does not respond to name but intermittently follows midline commands.  She will make  mumbling vocalizations with occasional single words such as yes or okay. Global aphasia No significant gaze preference today, moving eyes bilaterally.   Left facial droop. Tongue protrusion not cooperative.   Will move all 4 extremities spontaneously but not to command. Sensation, coordination and gait not tested.   ASSESSMENT/PLAN [name] is a 72 y.o. female with history of HTN, HLD, STEMI, CABG, tobacco use, seizures, 4 strokes with most recent last month, Afib on Eliquis who presented 6/21 AM after being found with rhythmic jerking movements of the bilateral upper extremities.  She was found to have a right MCA stroke with hemorrhagic conversion.  Stroke with hemorrhagic conversion - right MCA moderate and left frontal punctate infarcts with right BG hemorrhagic conversion, likely due to A-fib even on Eliquis CT head Acute 1.1 x 0.9 x 1.2 cm intraparenchymal hemorrhage in the right basal ganglia.  Possible trace intraventricular extension of hemorrhage of the right lateral ventricle. Remote left MCA and PCA territory infarct CTA head & neck Similar occlusion of a right proximal M2 MCA branch. Similar occlusion of the left ICA in the neck with poor opacification of small left MCA and ACA vessels. Similar occluded right vertebral artery. Similar severe stenosis of the left vertebral artery origin Repeat CT 6/21: Stable hematoma MRI  Acute right MCA distribution infarct underlies the petechial hemorrhage in the right putamen. Tiny acute infarct in the superior left frontal lobe. 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  Repeat CT 6/27 patchy right MCA infarct and stable petechial hemorrhage in right basal ganglia with 1 mm midline shift 2D Echo 06/23/22 EF 40-45% LDL 24 HgbA1c 4.9 UDS pending VTE prophylaxis - SCDs Eliquis (apixaban) daily prior to admission, now on aspirin 81 mg daily Therapy recommendations: SNF Disposition:  pending, palliative  care on board, continue full code for now  Seizure Presented with unresponsiveness with bilateral upper extremity jerking Received Versed for seizure activity EEG 6/21: suggestive of cortical dysfunction, likely secondary to underlying ICH.  EEG 6/22: suggestive of cortical dysfunction arising from right frontotemporal region, likely secondary to underlying ICH. No seizures.  LTM EEG discontinued Continue Keppra 500mg  BID  History of stroke 08/2012 had left MCA stroke, put on loop recorder 2015 found to have A-fib on loop recorder, started on Eliquis 03/2017 admitted for difficulty speaking.  CT no acute abnormality.  CTA head and neck right ICA 80% stenosis, left VA origin severe stenosis and right VA occlusion, as well as significant intracranial stenosis.  Carotid Doppler negative.  MRI showed left MCA infarct and old large left MCA and PCA infarcts.  LDL 53, A1c 5.6.  2D echo EF normal range.  Continue on Eliquis given noncompliance PTA.  Has residual mild expressive aphasia 06/2022 again admitted for worsening aphasia.  CT head and neck found new left ICA occlusion.  Right ICA 75% stenosis, unchanged vertebral arteries as above.  MRI showed left MCA small high convexity infarct.  EF 40 to 45%.  Continue on Eliquis.  Ventricular tachycardia On 6/26, patient had a 1 minute episode of V. tach which self terminated Cardiology consulted, appreciate input Put on amiodarione Continue metoprolol Not candidate for ICD, defer ischemic evaluation for now per card  PAF Found in 2015 by loop recorder On Eliquis PTA Currently AC on hold due to hemorrhagic conversion Repeat CT was stable, aspirin 81 mg daily started Resumed home metoprolol  Hypertension Home meds:  Toprol-XL 25mg  PRN Labetalol and Hydralazine IV  On metoprolol 25 twice daily BP goal < 160 Long-term BP goal normotensive  Hyperlipidemia Home meds:  Lipitor 80mg , Zetia 10mg  LDL 24, goal < 70 Decrease Lipitor to 40 given low  LDL Continue statin at discharge  Dysphagia Dysphagia 1 diet with nectar thick liquid On TF->changed to nocturnal TF Encourage po intake - not adequate yet Try to avoid PEG Calorie count 6/27 - 6/28 Dietitian on board  Tobacco abuse Current smoker Smoking cessation counseling will be provided  Other Stroke Risk Factors Advanced Age >/= 25  CAD/STEMI status post CABG Family hx stroke (brother)  Other Active Problems ? Medication non-compliance Dementia -restarted home Aricept  Hospital day # 8 I have personally obtained history,examined this patient, reviewed notes, independently viewed imaging studies, participated in medical decision making and plan of care.ROS completed by me personally and pertinent positives fully documented  I have made  any additions or clarifications directly to the above note. Agree with note above.  Patient's oral intake has been quite low.  If oral intake does not improve family may have to decide about PEG tube.  No family available at the bedside for discussion today.  Continue Keppra for seizures.  Greater than 50% time during this 35-minute visit was spent on counseling and coordination of care and discussion with patient and care team and answering questions.  Delia Heady, MD Medical Director [facility] Pager: [phone] 07/31/2022 12:55 PM   Delia Heady, MD Stroke Neurology 07/31/2022 12:53 PM    To contact Stroke Continuity provider, please refer to [email or web address]. After hours, contact General Neurology

## 2022-07-31 NOTE — Progress Notes (Signed)
Cardiology Progress Note  Patient ID: ASIANA ELTER MRN: [id number] DOB: 09/05/1950 Date of Encounter: 07/31/2022  Primary Cardiologist: Nanetta Batty, MD  Subjective   Chief Complaint: None.   HPI: No arrhythmias on telemetry.  Volume status acceptable.  ROS:  All other ROS reviewed and negative. Pertinent positives noted in the HPI.     Inpatient Medications  Scheduled Meds:   stroke: early stages of recovery book   Does not apply Once   amiodarone  400 mg Per Tube BID   [START ON 08/05/2022] amiodarone  400 mg Oral Daily   aspirin  81 mg Oral Daily   atorvastatin  40 mg Per Tube Daily   donepezil  5 mg Oral QHS   feeding supplement (OSMOLITE 1.2 CAL)  1,000 mL Per Tube Q24H   feeding supplement (PROSource TF20)  60 mL Per Tube Daily   free water  50 mL Per Tube Q4H   heparin injection (subcutaneous)  5,000 Units Subcutaneous Q12H   levETIRAcetam  500 mg Per Tube BID   metoprolol tartrate  25 mg Per Tube BID   multivitamin with minerals  1 tablet Per Tube Daily   mouth rinse  15 mL Mouth Rinse 4 times per day   pantoprazole (PROTONIX) IV  40 mg Intravenous QHS   senna-docusate  1 tablet Oral BID   thiamine  100 mg Per Tube Daily   Continuous Infusions:  PRN Meds: acetaminophen **OR** acetaminophen (TYLENOL) oral liquid 160 mg/5 mL **OR** acetaminophen, hydrALAZINE, labetalol, mouth rinse, mouth rinse, polyethylene glycol   Vital Signs   Vitals:   07/31/22 0013 07/31/22 0435 07/31/22 0832 07/31/22 0904  BP: 125/76 138/68 129/70 134/65  Pulse:  85  66  Resp: (!) 22 (!) 21 20 20   Temp: 98 F (36.7 C) 98.2 F (36.8 C) 99.6 F (37.6 C) 98.7 F (37.1 C)  TempSrc:   Oral Oral  SpO2: 99% 100% 99% 95%  Weight:      Height:        Intake/Output Summary (Last 24 hours) at 07/31/2022 0957 Last data filed at 07/31/2022 5621 Gross per 24 hour  Intake 2031.25 ml  Output 375 ml  Net 1656.25 ml      07/30/2022    7:23 AM 07/27/2022    7:38 AM 06/22/2022    9:41 AM   Last 3 Weights  Weight (lbs) 78 lb 84 lb 4.8 oz 78 lb 7.7 oz  Weight (kg) 35.381 kg 38.238 kg 35.6 kg      Telemetry  Overnight telemetry shows sinus rhythm 70s, which I personally reviewed.   Physical Exam   Vitals:   07/31/22 0013 07/31/22 0435 07/31/22 0832 07/31/22 0904  BP: 125/76 138/68 129/70 134/65  Pulse:  85  66  Resp: (!) 22 (!) 21 20 20   Temp: 98 F (36.7 C) 98.2 F (36.8 C) 99.6 F (37.6 C) 98.7 F (37.1 C)  TempSrc:   Oral Oral  SpO2: 99% 100% 99% 95%  Weight:      Height:        Intake/Output Summary (Last 24 hours) at 07/31/2022 0957 Last data filed at 07/31/2022 3086 Gross per 24 hour  Intake 2031.25 ml  Output 375 ml  Net 1656.25 ml       07/30/2022    7:23 AM 07/27/2022    7:38 AM 06/22/2022    9:41 AM  Last 3 Weights  Weight (lbs) 78 lb 84 lb 4.8 oz 78 lb 7.7 oz  Weight (kg)  35.381 kg 38.238 kg 35.6 kg    Body mass index is 15.23 kg/m.  General: Ill-appearing, frail Head: Atraumatic, normal size  Eyes: PEERLA, EOMI  Neck: Supple, no JVD Endocrine: No thryomegaly Cardiac: Normal S1, S2; RRR; no murmurs, rubs, or gallops Lungs: Clear to auscultation bilaterally, no wheezing, rhonchi or rales  Abd: Soft, nontender, no hepatomegaly  Ext: No edema, pulses 2+ Musculoskeletal: No deformities, Skin: Warm and dry, no rashes    Labs  High Sensitivity Troponin:   Recent Labs  Lab 07/23/22 1649  TROPONINIHS 13     Cardiac EnzymesNo results for input(s): "TROPONINI" in the last 168 hours. No results for input(s): "TROPIPOC" in the last 168 hours.  Chemistry Recent Labs  Lab 07/28/22 0417 07/29/22 1137 07/30/22 0634  NA 137 133* 134*  K 3.7 4.5 4.8  CL 106 102 100  CO2 22 24 25   GLUCOSE 130* 101* 150*  BUN 22 24* 26*  CREATININE 0.73 0.73 0.75  CALCIUM 8.3* 8.5* 8.6*  GFRNONAA >60 >60 >60  ANIONGAP 9 7 9     Hematology Recent Labs  Lab 07/26/22 0029 07/27/22 0630 07/28/22 0417  WBC 7.9 6.9 7.3  RBC 4.23 4.20 3.98  HGB 13.1 12.6  12.2  HCT 40.0 37.8 36.0  MCV 94.6 90.0 90.5  MCH 31.0 30.0 30.7  MCHC 32.8 33.3 33.9  RDW 13.9 14.0 14.0  PLT 193 216 215   BNPNo results for input(s): "BNP", "PROBNP" in the last 168 hours.  DDimer No results for input(s): "DDIMER" in the last 168 hours.   Radiology  No results found.  Cardiac Studies  TTE 06/23/2022  1. Left ventricular ejection fraction, by estimation, is 40 to 45%. The  left ventricle has mildly decreased function. The left ventricle  demonstrates global hypokinesis. Left ventricular diastolic function could  not be evaluated.   2. Right ventricular systolic function is mildly reduced. The right  ventricular size is normal. There is normal pulmonary artery systolic  pressure.   3. The mitral valve is normal in structure. Mild mitral valve  regurgitation.   4. Tricuspid valve regurgitation is moderate.   5. The aortic valve is tricuspid. Aortic valve regurgitation is trivial.  Unable to assess aortic valve gradient.   6. The inferior vena cava is normal in size with <50% respiratory  variability, suggesting right atrial pressure of 8 mmHg.   Patient Profile  CHARNESSA METZNER is a 72 y.o. female with CAD status post CABG, systolic heart failure, dementia, tobacco abuse, right bundle branch block who was admitted on 07/23/2022 with acute stroke with hemorrhagic conversion.  Cardiology consulted for ventricular tachycardia.  Assessment & Plan   # Wide-complex tachycardia # Sustained ventricular tachycardia versus paroxysmal atrial fibrillation with aberrant conduction  -Admitted with stroke in multiple distributions with hemorrhagic conversion. -Evaluated by cardiology.  Given stroke and hemorrhagic conversion, not a candidate for invasive angiography.  Known history of CAD and CABG. -Recommendation has been medical management given dementia and concerns for poor prognosis.  See cardiology discussion on 07/29/2022 and 07/30/2022. -At this time they recommended  amiodarone therapy.  Would continue this.  They have recommended 400 mg twice daily for 7 days and then 400 mg daily. -Concerns for atrial fibrillation on telemetry.  DOAC has been recommended.  Would recommend to transition to this when you are able per neurology. -No signs of volume overload.  # Chronic systolic heart failure, EF 40-45% -Has been managed conservatively given poor functional status.  No signs  of volume overload. -Continue metoprolol for now.  Not able to tolerate medications as feeding tube is in place.  Unclear benefit of aggressive medical therapy in this setting.  # Acute stroke with hemorrhagic conversion -Concerns for A-fib.  Transition to DOAC when you are able per neurology.  [facility] will sign off.   Medication Recommendations: As above Other recommendations (labs, testing, etc): None Follow up as an outpatient: We will arrange outpatient follow-up in 6 to 8 weeks with her primary cardiologist  For questions or updates, please contact [facility] Please consult [email or web address] for contact info under        Signed, Gerri Spore T. Flora Lipps, MD, [facility]  [facility]  07/31/2022 9:57 AM

## 2022-07-31 NOTE — Progress Notes (Signed)
Clarified with Dr. Flora Lipps regarding f/u plan since although patient had seen Dr. Allyson Sabal back in 2015, had new pt OV scheduled 7/30 with Dr. Tereso Newcomer to establish care. Dr. Flora Lipps recommends to keep this appointment as is. Reminder outlined on AVS.

## 2022-08-01 DIAGNOSIS — I610 Nontraumatic intracerebral hemorrhage in hemisphere, subcortical: Secondary | ICD-10-CM

## 2022-08-01 LAB — GLUCOSE, CAPILLARY
Glucose-Capillary: 114 mg/dL — ABNORMAL HIGH (ref 70–99)
Glucose-Capillary: 113 mg/dL — ABNORMAL HIGH (ref 70–99)
Glucose-Capillary: 109 mg/dL — ABNORMAL HIGH (ref 70–99)
Glucose-Capillary: 148 mg/dL — ABNORMAL HIGH (ref 70–99)

## 2022-08-01 NOTE — Progress Notes (Signed)
Daily Progress Note   Patient Name: [name] [name]       Date: 08/01/2022 DOB: [birth year]  Age: 72 y.o. MRN#: [id number] Attending Physician: Stroke, Md, MD Primary Care Physician: Preston Fleeting, MD Admit Date: 07/23/2022  Reason for Consultation/Follow-up: Establishing goals of care  Subjective: Discussed case with Neuro NP 6/29 - was asked to re-engage for PEG tube discussions due to patient's continued poor oral intake.  I have reviewed medical records including EPIC notes, MAR, and labs. Received report from primary RN - no acute concerns. RN reports patient is more awake and alert today as well as ate 50% of breakfast.  Went to visit patient at bedside - no family/visitors present. Patient was lying in bed awake and alert - NT providing care. No signs or non-verbal gestures of pain or discomfort noted. No respiratory distress, increased work of breathing, or secretions noted. Noted calorie count on the door - RN states it finished (72 hours old).   11:39 AM Attempted to call HCPOA/Cynthia to discuss diagnosis, prognosis, GOC, EOL wishes, disposition, and options - no answer - confidential voicemail left and PMT phone number provided with request to return call.  5:48 PM Did not receive return call from [place] today.  Length of Stay: 9  Current Medications: Scheduled Meds:    stroke: early stages of recovery book   Does not apply Once   amiodarone  400 mg Per Tube BID   [START ON 08/05/2022] amiodarone  400 mg Oral Daily   aspirin  81 mg Oral Daily   atorvastatin  40 mg Per Tube Daily   donepezil  5 mg Oral QHS   feeding supplement (OSMOLITE 1.2 CAL)  1,000 mL Per Tube Q24H   feeding supplement (PROSource TF20)  60 mL Per Tube Daily   free water  50 mL Per Tube Q4H    heparin injection (subcutaneous)  5,000 Units Subcutaneous Q12H   levETIRAcetam  500 mg Per Tube BID   metoprolol tartrate  25 mg Per Tube BID   multivitamin with minerals  1 tablet Per Tube Daily   mouth rinse  15 mL Mouth Rinse 4 times per day   pantoprazole (PROTONIX) IV  40 mg Intravenous QHS   senna-docusate  1 tablet Oral BID    Continuous Infusions:   PRN Meds: acetaminophen **  OR** acetaminophen (TYLENOL) oral liquid 160 mg/5 mL **OR** acetaminophen, hydrALAZINE, labetalol, mouth rinse, mouth rinse, polyethylene glycol  Physical Exam Vitals and nursing note reviewed.  Constitutional:      General: She is not in acute distress.    Appearance: She is ill-appearing.  Pulmonary:     Effort: No respiratory distress.  Skin:    General: Skin is warm and dry.  Neurological:     Mental Status: She is alert.     Motor: Weakness present.  Psychiatric:        Cognition and Memory: Cognition normal.             Vital Signs: BP 116/74   Pulse 83   Temp 97.8 F (36.6 C) (Oral)   Resp (!) 28   Ht 5' (1.524 m)   Wt 35.4 kg   SpO2 100%   BMI 15.23 kg/m  SpO2: SpO2: 100 % O2 Device: O2 Device: Room Air O2 Flow Rate: O2 Flow Rate (L/min): 0 L/min  Intake/output summary:  Intake/Output Summary (Last 24 hours) at 08/01/2022 1125 Last data filed at 08/01/2022 0334 Gross per 24 hour  Intake 240 ml  Output 300 ml  Net -60 ml   LBM: Last BM Date : 08/01/22 Baseline Weight: Weight: 38.2 kg Most recent weight: Weight: 35.4 kg       Palliative Assessment/Data: PPS 30%      Patient Active Problem List   Diagnosis Date Noted   ICH (intracerebral hemorrhage) (HCC) 07/23/2022   Stroke (HCC) 06/22/2022   HLD (hyperlipidemia) 06/22/2022   Protein-calorie malnutrition, severe (HCC) 06/22/2022   CAD (coronary artery disease) 06/22/2022   Hypoglycemia 06/22/2022   Heart failure with mildly reduced ejection fraction (HFmrEF) (HCC) 11/05/2021   COVID-19 11/04/2021    Dehydration 11/04/2021   Syncope 11/04/2021   Atrial fibrillation with RVR (HCC) 11/03/2021   Chronic anticoagulation 04/24/2019   Closed displaced fracture of proximal phalanx of right little finger    Dyslipidemia, goal LDL below 70    History of stroke    Hypokalemia    History of ST elevation myocardial infarction (STEMI)    History of seizure    History of memory loss    PAF (paroxysmal atrial fibrillation) (HCC) 10/19/2013   Constipation 10/04/2013   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 07/25/12 07/28/2012   Essential hypertension 07/21/2012   Tobacco abuse 07/21/2012    Palliative Care Assessment & Plan   Patient Profile: 72 y.o. female  with past medical history of 4 strokes with the most recent being last month possibly for stroke in 2014 leading to right-sided weakness and speech issues, A-fib on Eliquis, HTN/HLD, STEMI, CABG times 06/20/2012, tobacco use, seizure, family history of stroke with a brother who died at age 9 secondary to stroke/MI, admitted on 07/23/2022 with stroke, right basal ganglia ICH likely hypertensive versus hemorrhagic conversion of ischemic stroke, seizure.   Assessment: Principal Problem:   ICH (intracerebral hemorrhage) (HCC)   Decreased oral intake  Recommendations/Plan: Unfortunately, unable to speak with HCPOA today. VM left to return call Ongoing GOC pending clinical course PMT will continue to follow and support holistically   Goals of Care and Additional Recommendations: Limitations on Scope of Treatment: Full Scope Treatment  Code Status:    Code Status Orders  (From admission, onward)  Start     Ordered   07/23/22 1512  Full code  Continuous       Question:  By:  Answer:  Consent: discussion documented in EHR   07/23/22 1521           Code Status  History     Date Active Date Inactive Code Status Order ID Comments User Context   06/22/2022 1156 06/24/2022 1834 Full Code [id number]  Lorretta Harp, MD ED   11/03/2021 2238 11/06/2021 0117 Full Code [id number]  Evette Georges, MD ED   03/14/2017 1751 03/21/2017 1344 Full Code [id number]  Charlton Amor, PA-C Inpatient   03/14/2017 1751 03/14/2017 1751 Full Code [id number]  Charlton Amor, PA-C Inpatient   03/11/2017 0437 03/14/2017 1719 Full Code [id number]  Briscoe Deutscher, MD ED   08/16/2012 1523 08/26/2012 1244 Full Code [id number]  Charlton Amor, PA-C Inpatient   08/13/2012 1950 08/16/2012 1523 Full Code [id number]  Clydia Llano, MD Inpatient   07/25/2012 1931 07/27/2012 1239 Full Code [id number]  Barrett, Oley Balm Inpatient      Advance Directive Documentation    Flowsheet Row Most Recent Value  Type of Advance Directive Healthcare Power of Attorney  Pre-existing out of facility DNR order (yellow form or pink MOST form) --  "MOST" Form in Place? --       Prognosis:  Unable to determine  Discharge Planning: To Be Determined  Care plan was discussed with primary RN  Thank you for allowing the Palliative Medicine Team to assist in the care of this patient.   Total Time 35 minutes Prolonged Time Billed  no       Greater than 50%  of this time was spent counseling and coordinating care related to the above assessment and plan.  Haskel Khan, NP  Please contact Palliative Medicine Team phone at [phone] for questions and concerns.   *Portions of this note are a verbal dictation therefore any spelling and/or grammatical errors are due to the "[employer] One" system interpretation.

## 2022-08-01 NOTE — Plan of Care (Signed)
  Problem: Intracerebral Hemorrhage Tissue Perfusion: Goal: Complications of Intracerebral Hemorrhage will be minimized Outcome: Progressing   Problem: Health Behavior/Discharge Planning: Goal: Goals will be collaboratively established with patient/family Outcome: Progressing   Problem: Self-Care: Goal: Ability to participate in self-care as condition permits will improve Outcome: Progressing Goal: Verbalization of feelings and concerns over difficulty with self-care will improve Outcome: Progressing Goal: Ability to communicate needs accurately will improve Outcome: Progressing   Problem: Nutrition: Goal: Risk of aspiration will decrease Outcome: Progressing Goal: Dietary intake will improve Outcome: Progressing   Problem: Clinical Measurements: Goal: Ability to maintain clinical measurements within normal limits will improve Outcome: Progressing Goal: Will remain free from infection Outcome: Progressing Goal: Diagnostic test results will improve Outcome: Progressing Goal: Respiratory complications will improve Outcome: Progressing Goal: Cardiovascular complication will be avoided Outcome: Progressing   Problem: Activity: Goal: Risk for activity intolerance will decrease Outcome: Progressing   Problem: Nutrition: Goal: Adequate nutrition will be maintained Outcome: Progressing   Problem: Coping: Goal: Level of anxiety will decrease Outcome: Progressing   Problem: Elimination: Goal: Will not experience complications related to bowel motility Outcome: Progressing Goal: Will not experience complications related to urinary retention Outcome: Progressing   Problem: Pain Managment: Goal: General experience of comfort will improve Outcome: Progressing   Problem: Safety: Goal: Ability to remain free from injury will improve Outcome: Progressing   Problem: Skin Integrity: Goal: Risk for impaired skin integrity will decrease Outcome: Progressing   Problem:  Education: Goal: Knowledge of disease or condition will improve Outcome: Not Progressing Goal: Knowledge of secondary prevention will improve (MUST DOCUMENT ALL) Outcome: Not Progressing Goal: Knowledge of patient specific risk factors will improve Loraine Leriche N/A or DELETE if not current risk factor) Outcome: Not Progressing   Problem: Education: Goal: Knowledge of General Education information will improve Description: Including pain rating scale, medication(s)/side effects and non-pharmacologic comfort measures Outcome: Not Progressing   Problem: Health Behavior/Discharge Planning: Goal: Ability to manage health-related needs will improve Outcome: Not Progressing

## 2022-08-01 NOTE — Progress Notes (Addendum)
STROKE TEAM PROGRESS NOTE   INTERVAL HISTORY No family is at the bedside. Pt  , more interactive today. Mumbling words, intangible. On nocturnal tube feeding but po intake during the day not quite adequate.  Patient encouraged to eat more, cortrak still inplace Neurological exam unchanged.  Vital signs stable. Reviewed calorie count sheets and she is eating approx 50% of one meal per day over the weekend. Appreciate dietitian assistance in managing calorie count Palliative care to follow up today.    Vitals:   07/31/22 1948 07/31/22 2347 08/01/22 0334 08/01/22 0757  BP: 123/84 122/70 130/65 114/60  Pulse: 82 74 81 81  Resp: 16 15 17  (!) 28  Temp: 99.2 F (37.3 C) 97.8 F (36.6 C) 98.1 F (36.7 C) 97.8 F (36.6 C)  TempSrc: Axillary Axillary Axillary Oral  SpO2: 100% 100% 100% 100%  Weight:      Height:       CBC:  Recent Labs  Lab 07/27/22 0630 07/28/22 0417  WBC 6.9 7.3  NEUTROABS 5.1 5.3  HGB 12.6 12.2  HCT 37.8 36.0  MCV 90.0 90.5  PLT 216 215    Basic Metabolic Panel:  Recent Labs  Lab 07/27/22 0630 07/27/22 1759 07/28/22 0417 07/29/22 1137 07/29/22 1541 07/30/22 0634  NA 137  --    < > 133*  --  134*  K 3.2*  --    < > 4.5  --  4.8  CL 107  --    < > 102  --  100  CO2 20*  --    < > 24  --  25  GLUCOSE 123*  --    < > 101*  --  150*  BUN 19  --    < > 24*  --  26*  CREATININE 0.68  --    < > 0.73  --  0.75  CALCIUM 8.7*  --    < > 8.5*  --  8.6*  MG 1.7 1.7  --   --  1.7  --   PHOS 2.9 2.5  --   --   --   --    < > = values in this interval not displayed.    Lipid Panel:  No results for input(s): "CHOL", "TRIG", "HDL", "CHOLHDL", "VLDL", "LDLCALC" in the last 168 hours.  HgbA1c:  No results for input(s): "HGBA1C" in the last 168 hours.   IMAGING past 24 hours No results found.  PHYSICAL EXAM Constitutional: Cachetic, ill-appearing patient in no acute distress Cardiovascular: Normal rate and regular rhythm.  Respiratory: Effort normal and  regular on room air  GI: Soft.  No distension. There is no tenderness.  Skin: WDI MSK: Diffuse muscle wasting   Neuro: Patient lying bed, eyes open, does not respond to name but intermittently follows midline commands.  She will make mumbling vocalizations with occasional single words such as yes or okay. Global aphasia No significant gaze preference today, moving eyes bilaterally.   Left facial droop. Tongue protrusion not cooperative.   Will move all 4 extremities spontaneously but not to command. Sensation, coordination and gait not tested.   ASSESSMENT/PLAN [name] is a 72 y.o. female with history of HTN, HLD, STEMI, CABG, tobacco use, seizures, 4 strokes with most recent last month, Afib on Eliquis who presented 6/21 AM after being found with rhythmic jerking movements of the bilateral upper extremities.  She was found to have a right MCA stroke with hemorrhagic conversion.  Stroke with hemorrhagic  conversion - right MCA moderate and left frontal punctate infarcts with right BG hemorrhagic conversion, likely due to A-fib even on Eliquis CT head Acute 1.1 x 0.9 x 1.2 cm intraparenchymal hemorrhage in the right basal ganglia. Possible trace intraventricular extension of hemorrhage of the right lateral ventricle. Remote left MCA and PCA territory infarct CTA head & neck Similar occlusion of a right proximal M2 MCA branch. Similar occlusion of the left ICA in the neck with poor opacification of small left MCA and ACA vessels. Similar occluded right vertebral artery. Similar severe stenosis of the left vertebral artery origin Repeat CT 6/21: Stable hematoma MRI  Acute right MCA distribution infarct underlies the petechial hemorrhage in the right putamen. Tiny acute infarct in the superior left frontal lobe. 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  Repeat CT 6/27 patchy right MCA infarct and stable petechial hemorrhage in right basal ganglia with 1  mm midline shift 2D Echo 06/23/22 EF 40-45% LDL 24 HgbA1c 4.9 UDS pending VTE prophylaxis - SCDs Eliquis (apixaban) daily prior to admission, now on aspirin 81 mg daily Therapy recommendations: SNF Disposition:  pending, palliative care on board, continue full code for now  Seizure Presented with unresponsiveness with bilateral upper extremity jerking Received Versed for seizure activity EEG 6/21: suggestive of cortical dysfunction, likely secondary to underlying ICH.  EEG 6/22: suggestive of cortical dysfunction arising from right frontotemporal region, likely secondary to underlying ICH. No seizures.  LTM EEG discontinued Continue Keppra 500mg  BID  History of stroke 08/2012 had left MCA stroke, put on loop recorder 2015 found to have A-fib on loop recorder, started on Eliquis 03/2017 admitted for difficulty speaking.  CT no acute abnormality.  CTA head and neck right ICA 80% stenosis, left VA origin severe stenosis and right VA occlusion, as well as significant intracranial stenosis.  Carotid Doppler negative.  MRI showed left MCA infarct and old large left MCA and PCA infarcts.  LDL 53, A1c 5.6.  2D echo EF normal range.  Continue on Eliquis given noncompliance PTA.  Has residual mild expressive aphasia 06/2022 again admitted for worsening aphasia.  CT head and neck found new left ICA occlusion.  Right ICA 75% stenosis, unchanged vertebral arteries as above.  MRI showed left MCA small high convexity infarct.  EF 40 to 45%.  Continue on Eliquis.  Ventricular tachycardia On 6/26, patient had a 1 minute episode of V. tach which self terminated Cardiology consulted, appreciate input Put on amiodarione Continue metoprolol Not candidate for ICD, defer ischemic evaluation for now per card  PAF Found in 2015 by loop recorder On Eliquis PTA Currently AC on hold due to hemorrhagic conversion Repeat CT was stable, aspirin 81 mg daily started Resumed home metoprolol  Hypertension Home meds:   Toprol-XL 25mg  PRN Labetalol and Hydralazine IV  On metoprolol 25 twice daily BP goal < 160 Long-term BP goal normotensive  Hyperlipidemia Home meds:  Lipitor 80mg , Zetia 10mg  LDL 24, goal < 70 Lipitor to 40 given low LDL Continue statin at discharge  Dysphagia Dysphagia 1 diet with nectar thick liquid On TF->changed to nocturnal TF Encourage po intake - not adequate yet Try to avoid PEG Calorie count 6/27 - 6/28 Dietitian on board  Tobacco abuse Current smoker Smoking cessation counseling will be provided  Other Stroke Risk Factors Advanced Age >/= 13  CAD/STEMI status post CABG Family hx stroke (brother)  Other Active Problems ? Medication non-compliance Dementia -restarted home Aricept  Hospital day #  9  Patient seen and examined by NP/APP with MD. MD to update note as needed.   Elmer Picker, DNP, FNP-BC [facility] Pager: [phone] I have personally obtained history,examined this patient, reviewed notes, independently viewed imaging studies, participated in medical decision making and plan of care.ROS completed by me personally and pertinent positives fully documented  I have made any additions or clarifications directly to the above note. Agree with note above.  Continue aspirin for now.  Patient encouraged to increase oral intake otherwise will need to consider PEG tube  Delia Heady, MD Medical Director [facility] Pager: [phone] 08/01/2022 2:26 PM  To contact Stroke Continuity provider, please refer to [email or web address]. After hours, contact General Neurology

## 2022-08-01 NOTE — Plan of Care (Signed)
  Problem: Education: Goal: Knowledge of disease or condition will improve Outcome: Progressing Goal: Knowledge of secondary prevention will improve (MUST DOCUMENT ALL) Outcome: Progressing Goal: Knowledge of patient specific risk factors will improve Loraine Leriche N/A or DELETE if not current risk factor) Outcome: Progressing   Problem: Intracerebral Hemorrhage Tissue Perfusion: Goal: Complications of Intracerebral Hemorrhage will be minimized Outcome: Progressing   Problem: Health Behavior/Discharge Planning: Goal: Goals will be collaboratively established with patient/family Outcome: Progressing   Problem: Self-Care: Goal: Ability to participate in self-care as condition permits will improve Outcome: Progressing Goal: Verbalization of feelings and concerns over difficulty with self-care will improve Outcome: Progressing Goal: Ability to communicate needs accurately will improve Outcome: Progressing   Problem: Nutrition: Goal: Risk of aspiration will decrease Outcome: Progressing Goal: Dietary intake will improve Outcome: Progressing   Problem: Education: Goal: Knowledge of General Education information will improve Description: Including pain rating scale, medication(s)/side effects and non-pharmacologic comfort measures Outcome: Progressing   Problem: Health Behavior/Discharge Planning: Goal: Ability to manage health-related needs will improve Outcome: Progressing   Problem: Clinical Measurements: Goal: Ability to maintain clinical measurements within normal limits will improve Outcome: Progressing Goal: Will remain free from infection Outcome: Progressing Goal: Diagnostic test results will improve Outcome: Progressing Goal: Respiratory complications will improve Outcome: Progressing Goal: Cardiovascular complication will be avoided Outcome: Progressing   Problem: Activity: Goal: Risk for activity intolerance will decrease Outcome: Progressing   Problem:  Nutrition: Goal: Adequate nutrition will be maintained Outcome: Progressing   Problem: Coping: Goal: Level of anxiety will decrease Outcome: Progressing   Problem: Elimination: Goal: Will not experience complications related to bowel motility Outcome: Progressing Goal: Will not experience complications related to urinary retention Outcome: Progressing   Problem: Pain Managment: Goal: General experience of comfort will improve Outcome: Progressing   Problem: Safety: Goal: Ability to remain free from injury will improve Outcome: Progressing   Problem: Skin Integrity: Goal: Risk for impaired skin integrity will decrease Outcome: Progressing

## 2022-08-01 NOTE — Plan of Care (Signed)
  Problem: Education: Goal: Knowledge of disease or condition will improve Outcome: Progressing Goal: Knowledge of secondary prevention will improve (MUST DOCUMENT ALL) Outcome: Progressing Goal: Knowledge of patient specific risk factors will improve [name] [name] N/A or DELETE if not current risk factor) Outcome: Progressing   Problem: Intracerebral Hemorrhage Tissue Perfusion: Goal: Complications of Intracerebral Hemorrhage will be minimized Outcome: Progressing   Problem: Health Behavior/Discharge Planning: Goal: Goals will be collaboratively established with patient/family Outcome: Progressing   Problem: Self-Care: Goal: Ability to participate in self-care as condition permits will improve Outcome: Progressing Goal: Verbalization of feelings and concerns over difficulty with self-care will improve Outcome: Progressing Goal: Ability to communicate needs accurately will improve Outcome: Progressing   Problem: Nutrition: Goal: Risk of aspiration will decrease Outcome: Progressing Goal: Dietary intake will improve Outcome: Progressing

## 2022-08-02 DIAGNOSIS — R638 Other symptoms and signs concerning food and fluid intake: Secondary | ICD-10-CM

## 2022-08-02 DIAGNOSIS — Z789 Other specified health status: Secondary | ICD-10-CM

## 2022-08-02 LAB — GLUCOSE, CAPILLARY
Glucose-Capillary: 104 mg/dL — ABNORMAL HIGH (ref 70–99)
Glucose-Capillary: 121 mg/dL — ABNORMAL HIGH (ref 70–99)
Glucose-Capillary: 126 mg/dL — ABNORMAL HIGH (ref 70–99)
Glucose-Capillary: 126 mg/dL — ABNORMAL HIGH (ref 70–99)
Glucose-Capillary: 129 mg/dL — ABNORMAL HIGH (ref 70–99)
Glucose-Capillary: 129 mg/dL — ABNORMAL HIGH (ref 70–99)
Glucose-Capillary: 131 mg/dL — ABNORMAL HIGH (ref 70–99)
Glucose-Capillary: 137 mg/dL — ABNORMAL HIGH (ref 70–99)
Glucose-Capillary: 140 mg/dL — ABNORMAL HIGH (ref 70–99)
Glucose-Capillary: 140 mg/dL — ABNORMAL HIGH (ref 70–99)
Glucose-Capillary: 142 mg/dL — ABNORMAL HIGH (ref 70–99)
Glucose-Capillary: 157 mg/dL — ABNORMAL HIGH (ref 70–99)
Glucose-Capillary: 89 mg/dL (ref 70–99)
Glucose-Capillary: 97 mg/dL (ref 70–99)

## 2022-08-02 MED ORDER — OSMOLITE 1.2 CAL PO LIQD
840.0000 mL | ORAL | Status: DC
  Administered 2022-08-02 – 2022-08-03 (×2): 840 mL

## 2022-08-02 MED ORDER — ASPIRIN 81 MG PO CHEW
81.0000 mg | CHEWABLE_TABLET | Freq: Every day | ORAL | Status: DC
  Administered 2022-08-03 – 2022-08-06 (×4): 81 mg
  Filled 2022-08-02 (×4): qty 1

## 2022-08-02 MED ORDER — AMIODARONE HCL 200 MG PO TABS
400.0000 mg | ORAL_TABLET | Freq: Every day | ORAL | Status: DC
  Administered 2022-08-05 – 2022-08-06 (×2): 400 mg
  Filled 2022-08-02 (×2): qty 2

## 2022-08-02 MED ORDER — DONEPEZIL HCL 10 MG PO TABS
5.0000 mg | ORAL_TABLET | Freq: Every day | ORAL | Status: DC
  Administered 2022-08-02 – 2022-08-05 (×4): 5 mg
  Filled 2022-08-02 (×4): qty 1

## 2022-08-02 MED ORDER — SENNOSIDES-DOCUSATE SODIUM 8.6-50 MG PO TABS
1.0000 | ORAL_TABLET | Freq: Two times a day (BID) | ORAL | Status: DC
  Administered 2022-08-02 – 2022-08-06 (×6): 1
  Filled 2022-08-02 (×6): qty 1

## 2022-08-02 NOTE — Progress Notes (Signed)
We have been asked to see this patient for possible PEG tube.  I have thoroughly reviewed her chart and discussed her case with Richardson Dopp, with palliative care, as well as [name], HCPOA and patient's sister via phone call.  It sounds like the patient has some dementia at baseline on Aricept as well as struggling with her ADLs at home.  There was a plan for the patient to likely start transitioning into ALF prior to this admission.  She has now had another CVA with aphasia and spontaneous movements of her extremities but nothing to command.  We discussed that she is eating at times about 50% of her diet and this does go up and down from day to day.  We discussed that this can be the normal trajectory as we get older and as we have significant medical problems.  The biggest key to this discussion was to make sure that she understands that a PEG will NOT make the patient better overall.  It may give her nutrition, but it will not correct the dementia, the issues she was having at baseline with her ADLs, her aphasia, her ability to interact and follow commands, etc.  [name] expressed this was what she was really wanting to know.  She would like to further discuss this tonight when she and the rest of her family meet with palliative care again.  This meeting is at 5:30, which we will not be in the hospital at that time.  I have reached back out to Josseline to let her know about this discussion so she can relay this to the rest of the family at Cynthia's request.  [name] was appreciative of this. I let her know that our team would be in touch with either her or palliative care to get an update on the meeting tonight, tomorrow so further decisions can be made on whether the family would like to proceed with this placement or not.  Letha Cape 4:10 PM 08/02/2022

## 2022-08-02 NOTE — Plan of Care (Signed)
  Problem: Education: Goal: Knowledge of disease or condition will improve Outcome: Progressing Goal: Knowledge of secondary prevention will improve (MUST DOCUMENT ALL) Outcome: Progressing Goal: Knowledge of patient specific risk factors will improve (Mark N/A or DELETE if not current risk factor) Outcome: Progressing   Problem: Intracerebral Hemorrhage Tissue Perfusion: Goal: Complications of Intracerebral Hemorrhage will be minimized Outcome: Progressing   Problem: Health Behavior/Discharge Planning: Goal: Goals will be collaboratively established with patient/family Outcome: Progressing   Problem: Self-Care: Goal: Ability to participate in self-care as condition permits will improve Outcome: Progressing Goal: Verbalization of feelings and concerns over difficulty with self-care will improve Outcome: Progressing Goal: Ability to communicate needs accurately will improve Outcome: Progressing   Problem: Nutrition: Goal: Risk of aspiration will decrease Outcome: Progressing Goal: Dietary intake will improve Outcome: Progressing   

## 2022-08-02 NOTE — Progress Notes (Addendum)
STROKE TEAM PROGRESS NOTE   INTERVAL HISTORY Patient is seen in her room with no family at the bedside.  She is alert and does make eye contact with examiner, but speech is still unintelligible.  She has been hemodynamically stable, and her neurological exam remains stable.  She remains on calorie count, but appetite has been poor.   Vitals:   08/01/22 2323 08/02/22 0353 08/02/22 0801 08/02/22 0825  BP: (!) 133/90 (!) 151/69 (!) 147/66 131/84  Pulse: 77 82 82 67  Resp: 18 18  20   Temp: 97.7 F (36.5 C) 98.6 F (37 C)  98.5 F (36.9 C)  TempSrc: Oral Oral  Oral  SpO2: 100% 100%  100%  Weight:      Height:       CBC:  Recent Labs  Lab 07/27/22 0630 07/28/22 0417  WBC 6.9 7.3  NEUTROABS 5.1 5.3  HGB 12.6 12.2  HCT 37.8 36.0  MCV 90.0 90.5  PLT 216 215    Basic Metabolic Panel:  Recent Labs  Lab 07/27/22 0630 07/27/22 1759 07/28/22 0417 07/29/22 1137 07/29/22 1541 07/30/22 0634  NA 137  --    < > 133*  --  134*  K 3.2*  --    < > 4.5  --  4.8  CL 107  --    < > 102  --  100  CO2 20*  --    < > 24  --  25  GLUCOSE 123*  --    < > 101*  --  150*  BUN 19  --    < > 24*  --  26*  CREATININE 0.68  --    < > 0.73  --  0.75  CALCIUM 8.7*  --    < > 8.5*  --  8.6*  MG 1.7 1.7  --   --  1.7  --   PHOS 2.9 2.5  --   --   --   --    < > = values in this interval not displayed.    Lipid Panel:  No results for input(s): "CHOL", "TRIG", "HDL", "CHOLHDL", "VLDL", "LDLCALC" in the last 168 hours.  HgbA1c:  No results for input(s): "HGBA1C" in the last 168 hours.   IMAGING past 24 hours No results found.  PHYSICAL EXAM Constitutional: Cachetic, ill-appearing patient in no acute distress Cardiovascular: Normal rate and regular rhythm.  Respiratory: Effort normal and regular on room air  GI: Soft.  No distension. There is no tenderness.  Skin: WDI MSK: Diffuse muscle wasting   Neuro: Patient lying bed, eyes open, does not respond to name but will make eye contact  with and track examiner.  She does not follow commands today. Global aphasia No significant gaze preference today, moving eyes bilaterally.   Left facial droop. Tongue protrusion not cooperative.   Will move all 4 extremities spontaneously but not to command. Sensation, coordination and gait not tested.   ASSESSMENT/PLAN [name] is a 72 y.o. female with history of HTN, HLD, STEMI, CABG, tobacco use, seizures, 4 strokes with most recent last month, Afib on Eliquis who presented 6/21 AM after being found with rhythmic jerking movements of the bilateral upper extremities.  She was found to have a right MCA stroke with hemorrhagic conversion.  Stroke with hemorrhagic conversion - right MCA moderate and left frontal punctate infarcts with right BG hemorrhagic conversion, likely due to A-fib even on Eliquis CT head Acute 1.1 x 0.9 x 1.2  cm intraparenchymal hemorrhage in the right basal ganglia. Possible trace intraventricular extension of hemorrhage of the right lateral ventricle. Remote left MCA and PCA territory infarct CTA head & neck Similar occlusion of a right proximal M2 MCA branch. Similar occlusion of the left ICA in the neck with poor opacification of small left MCA and ACA vessels. Similar occluded right vertebral artery. Similar severe stenosis of the left vertebral artery origin Repeat CT 6/21: Stable hematoma MRI  Acute right MCA distribution infarct underlies the petechial hemorrhage in the right putamen. Tiny acute infarct in the superior left frontal lobe. 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  Repeat CT 6/27 patchy right MCA infarct and stable petechial hemorrhage in right basal ganglia with 1 mm midline shift 2D Echo 06/23/22 EF 40-45% LDL 24 HgbA1c 4.9 UDS pending VTE prophylaxis - SCDs Eliquis (apixaban) daily prior to admission, now on aspirin 81 mg daily Therapy recommendations: SNF Disposition:  pending, palliative care on  board, continue full code for now  Seizure Presented with unresponsiveness with bilateral upper extremity jerking Received Versed for seizure activity EEG 6/21: suggestive of cortical dysfunction, likely secondary to underlying ICH.  EEG 6/22: suggestive of cortical dysfunction arising from right frontotemporal region, likely secondary to underlying ICH. No seizures.  LTM EEG discontinued Continue Keppra 500mg  BID  History of stroke 08/2012 had left MCA stroke, put on loop recorder 2015 found to have A-fib on loop recorder, started on Eliquis 03/2017 admitted for difficulty speaking.  CT no acute abnormality.  CTA head and neck right ICA 80% stenosis, left VA origin severe stenosis and right VA occlusion, as well as significant intracranial stenosis.  Carotid Doppler negative.  MRI showed left MCA infarct and old large left MCA and PCA infarcts.  LDL 53, A1c 5.6.  2D echo EF normal range.  Continue on Eliquis given noncompliance PTA.  Has residual mild expressive aphasia 06/2022 again admitted for worsening aphasia.  CT head and neck found new left ICA occlusion.  Right ICA 75% stenosis, unchanged vertebral arteries as above.  MRI showed left MCA small high convexity infarct.  EF 40 to 45%.  Continue on Eliquis.  Ventricular tachycardia On 6/26, patient had a 1 minute episode of V. tach which self terminated Cardiology consulted, appreciate input Put on amiodarione Continue metoprolol Not candidate for ICD, defer ischemic evaluation for now per card  PAF Found in 2015 by loop recorder On Eliquis PTA Currently AC on hold due to hemorrhagic conversion Repeat CT was stable, aspirin 81 mg daily started Resumed home metoprolol  Hypertension Home meds:  Toprol-XL 25mg  PRN Labetalol and Hydralazine IV  On metoprolol 25 twice daily BP goal < 160 Long-term BP goal normotensive  Hyperlipidemia Home meds:  Lipitor 80mg , Zetia 10mg  LDL 24, goal < 70 Lipitor to 40 given low LDL Continue  statin at discharge  Dysphagia Dysphagia 1 diet with nectar thick liquid On TF->changed to nocturnal TF Encourage po intake - not adequate yet Try to avoid PEG Calorie count 6/27 - 7/1 P.o. intake still poor Dietitian on board  Tobacco abuse Current smoker Smoking cessation counseling will be provided  Other Stroke Risk Factors Advanced Age >/= 78  CAD/STEMI status post CABG Family hx stroke (brother)  Other Active Problems ? Medication non-compliance Dementia -restarted home Aricept  Hospital day # 10  Patient seen and examined by NP/APP with MD. MD to update note as needed.   [name] [name] , MSN,  AGACNP-BC [facility] See Amion for schedule and pager information 08/02/2022 11:47 AM  I have personally obtained history,examined this patient, reviewed notes, independently viewed imaging studies, participated in medical decision making and plan of care.ROS completed by me personally and pertinent positives fully documented  I have made any additions or clarifications directly to the above note. Agree with note above.  Patient unfortunately is not eating enough and calorie counts show less than 50% intake and she is malnourished at baseline.  She will need a PEG tube if appropriate medical care is to be continued.  Long discussion over the phone with the patient's sister who is also met with palliative care and had discussion about goals of care and she has decided to proceed with with patient and care team and answering questions.  We will consult trauma team for the same.  Greater than 50% time during this 35-minute visit was spent in counseling and coordination of care about her malnutrition and decreased oral intake and discussion about PEG tube with patient and family and answering questions Delia Heady, MD Medical Director [facility] Pager: [phone] 08/02/2022 2:02 PM  To contact Stroke Continuity provider, please refer to  [email or web address]. After hours, contact General Neurology

## 2022-08-02 NOTE — Progress Notes (Incomplete)
Daily Progress Note   Patient Name: [name] [name]       Date: 08/02/2022 DOB: [birth year]  Age: 72 y.o. MRN#: [id number] Attending Physician: Stroke, Md, MD Primary Care Physician: Preston Fleeting, MD Admit Date: 07/23/2022  Reason for Consultation/Follow-up: Establishing goals of care  Subjective: Medical records reviewed including progress notes, labs, and imaging. Patient assessed at the bedside. She is sleeping comfortably. Did not attempt to arouse in order to preserve comfort. No family present during my visit.  I called patient's sister/HCPOA [name] for goals of care conversation with focus on artificial nutrition. Reviewed patient's ongoing difficulties with oral intake and calorie counts being insufficient to sustain herself. Reviewed her previous conversations with PMT. Counseled on the risks and benefits of PEG placement. At this time, [name] would like to proceed with PEG placement if indicated. She called her brothers to confirm they agree with this decision.  [name] is also concerned about patient's long-term prognosis, sharing that she was not made aware of her husband's decline and transition to EOL until the day he died. I gently shared that I am concerned patient has a prognosis of months at best, with or without PEG placement. Emphasized that PEG placement would not improve patient's health or debility. She verbalized her understanding. She is requesting an in-person meeting this evening to discuss with her brothers present so they can hear the same information and understand course of patient's hospitalization. Family meeting scheduled today at 5:30pm.  Returned to the bedside at 5:30pm and waited for family to arrive. [name] shares that she is hopeful for comfort care  and relief of patient's suffering, to "let her go." Her brothers however, are not ready for this and not as familiar with death as she is.   Relocated to the 3W waiting room and reviewed the above conversations, sharing the team's concern that PEG placement will not address patient's symptoms, quality of life, or overall functional status. Emphasized that the risks of PEG placement are high with limited potential benefit. Family understands these concerns, however patient's brothers wish to proceed with PEG and see how she does. [name] is in agreement as well.  Questions and concerns addressed. PMT will continue to support holistically.   Length of Stay: 10  Physical Exam Vitals and nursing note reviewed.  Constitutional:  General: She is not in acute distress.    Appearance: She is ill-appearing.     Comments: Cortrak in place  Pulmonary:     Effort: No respiratory distress.  Skin:    General: Skin is warm and dry.  Neurological:     Mental Status: She is alert.     Motor: Weakness present.  Psychiatric:        Cognition and Memory: Cognition normal.             Vital Signs: BP 131/84 (BP Location: Left Arm)   Pulse 67   Temp 98.5 F (36.9 C) (Oral)   Resp 20   Ht 5' (1.524 m)   Wt 35.4 kg   SpO2 100%   BMI 15.23 kg/m  SpO2: SpO2: 100 % O2 Device: O2 Device: Room Air O2 Flow Rate: O2 Flow Rate (L/min): 0 L/min       Palliative Assessment/Data: PPS 30%   Palliative Care Assessment & Plan   Patient Profile: 72 y.o. female  with past medical history of 4 strokes with the most recent being last month possibly for stroke in 2014 leading to right-sided weakness and speech issues, A-fib on Eliquis, HTN/HLD, STEMI, CABG times 06/20/2012, tobacco use, seizure, family history of stroke with a brother who died at age 54 secondary to stroke/MI, admitted on 07/23/2022 with stroke, right basal ganglia ICH likely hypertensive versus hemorrhagic conversion of ischemic stroke,  seizure.   Assessment: Principal Problem:   ICH (intracerebral hemorrhage) (HCC)   Decreased oral intake  Recommendations/Plan: Continue full code/full scope treatment Patient's family have expressed willingness to proceed with PEG placement  Family agreeable to outpatient palliative care referral for ongoing GOC pending clinical course PMT will continue to follow and support holistically   Prognosis:  months  Discharge Planning: Skilled Nursing Facility for rehab with Palliative care service follow-up  Care plan was discussed with patient's family, neuro MD, surgery PA   Total time: I spent 90 minutes in the care of the patient today in the above activities and documenting the encounter.  MDM: high   Richardson Dopp, PA-C Palliative Medicine Team Team phone # [phone]  Thank you for allowing the Palliative Medicine Team to assist in the care of this patient. Please utilize secure chat with additional questions, if there is no response within 30 minutes please call the above phone number.  Palliative Medicine Team providers are available by phone from 7am to 7pm daily and can be reached through the team cell phone.  Should this patient require assistance outside of these hours, please call the patient's attending physician.  Portions of this note are a verbal dictation therefore any spelling and/or grammatical errors are due to the "[employer]" system interpretation.

## 2022-08-02 NOTE — Progress Notes (Signed)
PT Cancellation Note  Patient Details Name: [name] [name] MRN: [id number] DOB: Mar 30, 1950   Cancelled Treatment:    Reason Eval/Treat Not Completed: (P) Other (comment) (pt family members currently meeting with Palliative Care team in room, defer until next date.) Will continue efforts per PT plan of care as schedule permits next date.   [name] [name] 08/02/2022, 6:25 PM

## 2022-08-02 NOTE — Progress Notes (Deleted)
Received consult for possible PEG placement s/p CVA. Per palliative note yesterday patient ate 50% of breakfast and was more alert yesterday. Dietician is currently recommending continued enteral nutrition until PO intake able to improve more consistently, but she is progressing with PO intake. Palliative was not able to speak with [name] [name], yesterday. Will await updated palliative recommendations and see in consultation for PEG placement pending palliative discussions. Alternatively, if patient's PO intake continues to improve then PEG placement may not be clinically indicated.   [name], [facility] Surgery 08/02/2022, 4:05 PM Please see Amion for pager number during day hours 7:00am-4:30pm

## 2022-08-02 NOTE — Progress Notes (Signed)
Calorie Count Note  48 hour calorie count ordered.  Diet: dysphagia 1, nectar thick liquids Supplements: magic cup TID  Estimated Nutritional Needs:  Kcal:  1400-1600 Protein:  70-85 grams Fluid:  >1.5 L/day  Calorie count over the weekend reflects pt's intake to have improved from taking just bites at meals last week. However Saturday, pt's intake was <50% of minimum estimated nutrition needs and Sundays intake just above that. In the setting of malnutrition and inability to consistently meet adequate nutrition needs via PO intake alone, she would benefit from ongoing enteral nutrition support until meeting nutrition needs via PO intake more consistently. Discussed with Palliative and Neurology.   6/29 Breakfast: 150 kcal and 4g protein Lunch: 0 kcal/protein Dinner: 251 kcal and 9g protein Supplements: 276 kcal and 9g protein  Total intake: 677 kcal (48% of minimum estimated needs)  22g protein (31% of minimum estimated needs)  6/30 Breakfast: 250 kcal and 8g protein Lunch: 148 kcal and 8g protein Dinner: 228 kcal and 11g protein Supplements: 290 kcal and 9g protein  Total intake: 916 kcal (65% of minimum estimated needs)  36g protein (51% of minimum estimated needs)  NUTRITION DIAGNOSIS:  Severe Malnutrition related to chronic illness as evidenced by severe fat depletion, severe muscle depletion. - remains applicable   GOAL:  Patient will meet greater than or equal to 90% of their needs - progressing   INTERVENTION:  Adjust tube feeding  Osmolite 1.2 at 60 ml/h x 14h ( per day) Prosource TF20 60 ml 1x/d Provides 1088 kcal (78% of minimum estimated kcal need), 67 gm protein (96% of minimum estimated protein needs), free water daily MVI with minerals daily Discontinue calorie count  Drusilla Kanner, RDN, LDN Clinical Nutrition

## 2022-08-03 LAB — GLUCOSE, CAPILLARY
Glucose-Capillary: 142 mg/dL — ABNORMAL HIGH (ref 70–99)
Glucose-Capillary: 139 mg/dL — ABNORMAL HIGH (ref 70–99)
Glucose-Capillary: 106 mg/dL — ABNORMAL HIGH (ref 70–99)
Glucose-Capillary: 138 mg/dL — ABNORMAL HIGH (ref 70–99)
Glucose-Capillary: 138 mg/dL — ABNORMAL HIGH (ref 70–99)

## 2022-08-03 NOTE — Progress Notes (Signed)
Physical Therapy Treatment Patient Details Name: [name] [name] MRN: [id number] DOB: 1950-11-12 Today's Date: 08/03/2022   History of Present Illness 72 y.o. female who presents 07/23/22 after being found in the fetal position with rhythmic jerking movements of the bilateral upper extremities. CT acute IPH in right basal ganglia with possible IVH; long-term EEG ordered; NIHSS=24;   PMH significant of: hypertension, hyperlipidemia, STEMI, CABG, tobacco use, seizures, 4 strokes (including L MCA) and atrial fibrillation on Eliquis    PT Comments  Pt received in bed with knees pulled up to chest and LE's to L. Passive extension stretching to B knees in supine as well as RLE ER, pt winced but overall tolerated well. Pt needed tot A +2 to come to sitting EOB. Was able to extend through hips and knees more in sitting than when she was in supine. Worked on trunk control as well as cervical stretching in supported position. Pt alert throughout session and was able to verbalize a few sentences but some verbalization unable to be understood. Attempted to have pt wt shift fwd to put wt through LLE but pt seemed fearful of falling and resistant to this. PT will continue to follow.     Assistance Recommended at Discharge Frequent or constant Supervision/Assistance  If plan is discharge home, recommend the following:  Can travel by private vehicle    A lot of help with bathing/dressing/bathroom;Assistance with feeding;Direct supervision/assist for medications management;Direct supervision/assist for financial management;Assist for transportation;Help with stairs or ramp for entrance   No  Equipment Recommendations  None recommended by PT    Recommendations for Other Services       Precautions / Restrictions Precautions Precautions: Fall Precaution Comments: seizure, Cortrak, concerns for pressure wound due to positioning Restrictions Weight Bearing Restrictions: No     Mobility  Bed  Mobility Overal bed mobility: Needs Assistance Bed Mobility: Rolling, Sidelying to Sit, Sit to Sidelying Rolling: Total assist, +2 for physical assistance Sidelying to sit: Total assist, +2 for physical assistance     Sit to sidelying: Total assist, +2 for physical assistance General bed mobility comments: rolled to place pads and reposition pt in an attempt to provide greater LE extension as well as pressure relief to the L side. Tot A to roll and pt remains curled up throughout. Tot A to come to sitting EOB and return to supine    Transfers                   General transfer comment: attempted to have pt lean fwd to put wt through LLE with hopes that she could use LE to assist with scoot transfer but pt fearful EOB and resistant to leaning fwd and to graping therapist instead of bed rail    Ambulation/Gait               General Gait Details: unable   Stairs             Wheelchair Mobility     Tilt Bed    Modified Rankin (Stroke Patients Only) Modified Rankin (Stroke Patients Only) Pre-Morbid Rankin Score: Moderate disability Modified Rankin: Severe disability     Balance Overall balance assessment: Needs assistance Sitting-balance support: Feet supported Sitting balance-Leahy Scale: Poor Sitting balance - Comments: required assistance for head alignment and trunk flexion. posterior leaning while on EOB with mod assist for balance Postural control: Posterior lean  Cognition Arousal/Alertness: Awake/alert Behavior During Therapy: Flat affect Overall Cognitive Status: Difficult to assess                                 General Comments: appeared fearful of falling, holding onto rail and not wanting to let go.  Stated "what's the point while on EOB"        Exercises General Exercises - Lower Extremity Ankle Circles/Pumps: PROM, Both, 5 reps, Seated Long Arc Quad: AROM, Left, Seated,  10 reps Hip ABduction/ADduction: PROM, 10 reps, Supine, Left Other Exercises Other Exercises: Cervical stretching into R lateral flexion and cervical rotation Other Exercises: Gentle R knee extension stretch; hip ER stretch bilaterally in supine    General Comments General comments (skin integrity, edema, etc.): VSS on RA.      Pertinent Vitals/Pain Pain Assessment Pain Assessment: Faces Faces Pain Scale: Hurts a little bit Breathing: normal Negative Vocalization: none Facial Expression: smiling or inexpressive Body Language: relaxed Consolability: no need to console PAINAD Score: 0 Pain Location: generalized with movemen, BLE knees with extention Pain Descriptors / Indicators: Grimacing Pain Intervention(s): Repositioned, Monitored during session, Limited activity within patient's tolerance    Home Living                          Prior Function            PT Goals (current goals can now be found in the care plan section) Acute Rehab PT Goals Patient Stated Goal: pt unable PT Goal Formulation: Patient unable to participate in goal setting Time For Goal Achievement: 08/07/22 Potential to Achieve Goals: Poor Progress towards PT goals: Not progressing toward goals - comment    Frequency    Min 2X/week      PT Plan Current plan remains appropriate    Co-evaluation PT/OT/SLP Co-Evaluation/Treatment: Yes Reason for Co-Treatment: Complexity of the patient's impairments (multi-system involvement);For patient/therapist safety PT goals addressed during session: Mobility/safety with mobility;Balance;Strengthening/ROM OT goals addressed during session: Strengthening/ROM      AM-PAC PT "6 Clicks" Mobility   Outcome Measure  Help needed turning from your back to your side while in a flat bed without using bedrails?: Total Help needed moving from lying on your back to sitting on the side of a flat bed without using bedrails?: Total Help needed moving to and  from a bed to a chair (including a wheelchair)?: Total Help needed standing up from a chair using your arms (e.g., wheelchair or bedside chair)?: Total Help needed to walk in hospital room?: Total Help needed climbing 3-5 steps with a railing? : Total 6 Click Score: 6    End of Session Equipment Utilized During Treatment: Gait belt Activity Tolerance: Patient tolerated treatment well Patient left: in bed;with call bell/phone within reach;with bed alarm set Nurse Communication: Other (comment) (PRAFO's, air mattress bed) PT Visit Diagnosis: Difficulty in walking, not elsewhere classified (R26.2);Other symptoms and signs involving the nervous system (R29.898)     Time: 1610-9604 PT Time Calculation (min) (ACUTE ONLY): 28 min  Charges:    $Therapeutic Activity: 8-22 mins PT General Charges $$ ACUTE PT VISIT: 1 Visit                     Lyanne Co, PT  Acute Rehab Services Secure chat preferred Office [phone]    Lawana Chambers Clayson Riling 08/03/2022, 1:54 PM

## 2022-08-03 NOTE — Progress Notes (Signed)
Daily Progress Note   Patient Name: [name] [name]       Date: 08/03/2022 DOB: 12-27-1950  Age: 72 y.o. MRN#: [id number] Attending Physician: Stroke, Md, MD Primary Care Physician: Preston Fleeting, MD Admit Date: 07/23/2022  Reason for Consultation/Follow-up: Establishing goals of care  Subjective: Medical records reviewed including progress notes, labs, and imaging. Patient assessed at the bedside. She is sleeping and in no acute distress. No family present during my visit.  Received call from patient's sister/HCPOA [name] with new decision to avoid PEG placement. Called her back and confirmed family no longer wishes for PEG placement. She shares that after our meeting yesterday evening, they returned to visit patient and she had eaten nearly her entire meal with assistance from NT. Her brothers were more open to careful hand feeding with a scheduled rotation for family to visit her and assist with meals. [name] is grateful to hear this. I shared that I would update the rest of the team accordingly. She remains interested in palliative care follow up and understands that if patient does not eat enough, more conversations regarding transition to full comfort measures will be necessary.  Questions and concerns addressed. PMT will continue to support holistically.   Length of Stay: 11  Physical Exam Vitals and nursing note reviewed.  Constitutional:      General: She is not in acute distress.    Appearance: She is ill-appearing.     Comments: Cortrak in place  Cardiovascular:     Rate and Rhythm: Normal rate.  Pulmonary:     Effort: No respiratory distress.  Skin:    General: Skin is warm and dry.  Neurological:     Mental Status: She is alert.     Motor: Weakness present.   Psychiatric:        Cognition and Memory: Cognition normal.            Vital Signs: BP 117/65 (BP Location: Left Arm)   Pulse 67   Temp 97.7 F (36.5 C) (Oral)   Resp 20   Ht 5' (1.524 m)   Wt 35.4 kg   SpO2 100%   BMI 15.23 kg/m  SpO2: SpO2: 100 % O2 Device: O2 Device: Room Air O2 Flow Rate: O2 Flow Rate (L/min): 0 L/min       Palliative Assessment/Data:  PPS 30%   Palliative Care Assessment & Plan   Patient Profile: 72 y.o. female  with past medical history of 4 strokes with the most recent being last month possibly for stroke in 2014 leading to right-sided weakness and speech issues, A-fib on Eliquis, HTN/HLD, STEMI, CABG times 06/20/2012, tobacco use, seizure, family history of stroke with a brother who died at age 22 secondary to stroke/MI, admitted on 07/23/2022 with stroke, right basal ganglia ICH likely hypertensive versus hemorrhagic conversion of ischemic stroke, seizure.   Assessment: Principal Problem:   ICH (intracerebral hemorrhage) (HCC)   Decreased oral intake  Recommendations/Plan: Continue full code/full scope treatment Patient's family no longer wish for PEG placement, they have opted for careful hand feeding and encouragement at SNF  Family remains agreeable to outpatient palliative care referral for ongoing GOC pending clinical course PMT will continue to follow peripherally and support as needed   Prognosis:  months  Discharge Planning: Skilled Nursing Facility for rehab with Palliative care service follow-up  Care plan was discussed with patient's family, neuro NP, surgery PA   MDM: high   Tray Klayman Loleta Rose Palliative Medicine Team Team phone # [phone]  Thank you for allowing the Palliative Medicine Team to assist in the care of this patient. Please utilize secure chat with additional questions, if there is no response within 30 minutes please call the above phone number.  Palliative Medicine Team providers are available by  phone from 7am to 7pm daily and can be reached through the team cell phone.  Should this patient require assistance outside of these hours, please call the patient's attending physician.  Portions of this note are a verbal dictation therefore any spelling and/or grammatical errors are due to the "[employer]agon [employer] One" system interpretation.

## 2022-08-03 NOTE — Progress Notes (Addendum)
STROKE TEAM PROGRESS NOTE   INTERVAL HISTORY Patient is seen in her room with no family at the bedside.  She remains hemodynamically stable, and her neurological exam is unchanged.  He has decided against PEG tube placement, and patient is pending SNF placement..   Vitals:   08/02/22 1541 08/02/22 2050 08/03/22 0804 08/03/22 1147  BP: 121/64 119/78 116/63 117/65  Pulse: 78  72 67  Resp: 20 (!) 23 (!) 21 20  Temp: 98.9 F (37.2 C)  98.2 F (36.8 C) 97.7 F (36.5 C)  TempSrc: Oral  Oral Oral  SpO2:  98% 100% 100%  Weight:      Height:       CBC:  Recent Labs  Lab 07/28/22 0417  WBC 7.3  NEUTROABS 5.3  HGB 12.2  HCT 36.0  MCV 90.5  PLT 215    Basic Metabolic Panel:  Recent Labs  Lab 07/27/22 1759 07/28/22 0417 07/29/22 1137 07/29/22 1541 07/30/22 0634  NA  --    < > 133*  --  134*  K  --    < > 4.5  --  4.8  CL  --    < > 102  --  100  CO2  --    < > 24  --  25  GLUCOSE  --    < > 101*  --  150*  BUN  --    < > 24*  --  26*  CREATININE  --    < > 0.73  --  0.75  CALCIUM  --    < > 8.5*  --  8.6*  MG 1.7  --   --  1.7  --   PHOS 2.5  --   --   --   --    < > = values in this interval not displayed.    Lipid Panel:  No results for input(s): "CHOL", "TRIG", "HDL", "CHOLHDL", "VLDL", "LDLCALC" in the last 168 hours.  HgbA1c:  No results for input(s): "HGBA1C" in the last 168 hours.   IMAGING past 24 hours No results found.  PHYSICAL EXAM Constitutional: Cachetic, ill-appearing patient in no acute distress Cardiovascular: Normal rate and regular rhythm.  Respiratory: Effort normal and regular on room air  GI: Soft.  No distension. There is no tenderness.  Skin: WDI MSK: Diffuse muscle wasting   Neuro: Patient lying bed, eyes open, does not respond to name but will make eye contact with and track examiner.  She does not follow commands today. Global aphasia No significant gaze preference today, moving eyes bilaterally.   Left facial droop. Tongue  protrusion not cooperative.   Will move all 4 extremities spontaneously but not to command. Sensation, coordination and gait not tested.   ASSESSMENT/PLAN [name] is a 72 y.o. female with history of HTN, HLD, STEMI, CABG, tobacco use, seizures, 4 strokes with most recent last month, Afib on Eliquis who presented 6/21 AM after being found with rhythmic jerking movements of the bilateral upper extremities.  She was found to have a right MCA stroke with hemorrhagic conversion.  Stroke with hemorrhagic conversion - right MCA moderate and left frontal punctate infarcts with right BG hemorrhagic conversion, likely due to A-fib even on Eliquis CT head Acute 1.1 x 0.9 x 1.2 cm intraparenchymal hemorrhage in the right basal ganglia. Possible trace intraventricular extension of hemorrhage of the right lateral ventricle. Remote left MCA and PCA territory infarct CTA head & neck Similar occlusion of a right  proximal M2 MCA branch. Similar occlusion of the left ICA in the neck with poor opacification of small left MCA and ACA vessels. Similar occluded right vertebral artery. Similar severe stenosis of the left vertebral artery origin Repeat CT 6/21: Stable hematoma MRI  Acute right MCA distribution infarct underlies the petechial hemorrhage in the right putamen. Tiny acute infarct in the superior left frontal lobe. 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  Repeat CT 6/27 patchy right MCA infarct and stable petechial hemorrhage in right basal ganglia with 1 mm midline shift 2D Echo 06/23/22 EF 40-45% LDL 24 HgbA1c 4.9 UDS pending VTE prophylaxis - SCDs Eliquis (apixaban) daily prior to admission, now on aspirin 81 mg daily Therapy recommendations: SNF Disposition:  pending, palliative care on board, continue full code for now  Seizure Presented with unresponsiveness with bilateral upper extremity jerking Received Versed for seizure activity EEG 6/21:  suggestive of cortical dysfunction, likely secondary to underlying ICH.  EEG 6/22: suggestive of cortical dysfunction arising from right frontotemporal region, likely secondary to underlying ICH. No seizures.  LTM EEG discontinued Continue Keppra 500mg  BID  History of stroke 08/2012 had left MCA stroke, put on loop recorder 2015 found to have A-fib on loop recorder, started on Eliquis 03/2017 admitted for difficulty speaking.  CT no acute abnormality.  CTA head and neck right ICA 80% stenosis, left VA origin severe stenosis and right VA occlusion, as well as significant intracranial stenosis.  Carotid Doppler negative.  MRI showed left MCA infarct and old large left MCA and PCA infarcts.  LDL 53, A1c 5.6.  2D echo EF normal range.  Continue on Eliquis given noncompliance PTA.  Has residual mild expressive aphasia 06/2022 again admitted for worsening aphasia.  CT head and neck found new left ICA occlusion.  Right ICA 75% stenosis, unchanged vertebral arteries as above.  MRI showed left MCA small high convexity infarct.  EF 40 to 45%.  Continue on Eliquis.  Ventricular tachycardia On 6/26, patient had a 1 minute episode of V. tach which self terminated Cardiology consulted, appreciate input Put on amiodarione Continue metoprolol Not candidate for ICD, defer ischemic evaluation for now per card  PAF Found in 2015 by loop recorder On Eliquis PTA Currently AC on hold due to hemorrhagic conversion Repeat CT was stable, aspirin 81 mg daily started Resumed home metoprolol  Hypertension Home meds:  Toprol-XL 25mg  PRN Labetalol and Hydralazine IV  On metoprolol 25 twice daily BP goal < 160 Long-term BP goal normotensive  Hyperlipidemia Home meds:  Lipitor 80mg , Zetia 10mg  LDL 24, goal < 70 Lipitor to 40 given low LDL Continue statin at discharge  Dysphagia Dysphagia 1 diet with nectar thick liquid On TF->changed to nocturnal TF Encourage po intake - not adequate yet Family has decided  against PEG placement Calorie count 6/27 - 7/1 P.o. intake still poor Dietitian on board  Tobacco abuse Current smoker Smoking cessation counseling will be provided  Other Stroke Risk Factors Advanced Age >/= 79  CAD/STEMI status post CABG Family hx stroke (brother)  Other Active Problems ? Medication non-compliance Dementia -restarted home Aricept  Hospital day # 11  Patient seen and examined by NP/APP with MD. MD to update note as needed.   [name] [name] , MSN, AGACNP-BC [facility] See Amion for schedule and pager information 08/03/2022 12:17 PM I have personally obtained history,examined this patient, reviewed notes, independently viewed imaging studies, participated in medical decision making and plan  of care.ROS completed by me personally and pertinent positives fully documented  I have made any additions or clarifications directly to the above note. Agree with note above.  Patient's family has decided against PEG tube after discussion with palliative care team.  Patient will go to skilled nursing facility when bed available.  [name] Heady, MD Medical Director [facility] Pager: [phone] 08/03/2022 2:47 PM   To contact Stroke Continuity provider, please refer to [email or web address]. After hours, contact General Neurology

## 2022-08-03 NOTE — Progress Notes (Signed)
Speech Language Pathology Treatment: Dysphagia;Cognitive-Linquistic  Patient Details Name: [name] [name] MRN: [id number] DOB: 10/17/1950 Today's Date: 08/03/2022 Time: 4098-1191 SLP Time Calculation (min) (ACUTE ONLY): 14 min  Assessment / Plan / Recommendation Clinical Impression  Pt seen for dysphagia and communication with breakfast. Pt positioned upright on arrival with legs contracted toward chest. She attempted to communicate in short phrase without content known. Responding to "yeah" to most questions and using head to gesture. She was unable to choose place given choice of two and unable to name object letting her hold the spoon and giving tactile and verbal cues, stated "I don't know." Dysarthria present also with hypophonia and decreased articulatory movement.  She consumed puree pancake and nectar thick liquids via spoon with delayed bolus formation and transit- mild and suspect delayed initiation of swallow. There were no s/s aspiration or oral residue.  Several trials thin liquid via spoon with oral delays but no signs of aspiration. Do not feel pt could participate effectively in MBS given her lower extremity contractures. Upgrading to thin family would need to accept aspiration risk. Nectar thick liquids seems appropriate to continue at present time and puree given oral and pharyngeal delays. Per Palliative, family is deciding on PEG tube.    HPI HPI: 72 y.o. female presented to ED via EMS with AMS, seizure, new CVA. PMHx hypertension, hyperlipidemia, STEMI, CABG, tobacco use, seizures, 4 strokes with most recent last month (bedside swallow eval with recs for dysphagia 2/chopped, thin liquids). MRI: Acute right MCA distribution infarct underlies the petechial  hemorrhage in the right putamen. Tiny acute infarct in the superior left frontal lobe.2. Extensive chronic ischemic injury especially in the left cerebral hemisphere. Currently, patient on long-term EEG.      SLP Plan  Continue  with current plan of care      Recommendations for follow up therapy are one component of a multi-disciplinary discharge planning process, led by the attending physician.  Recommendations may be updated based on patient status, additional functional criteria and insurance authorization.    Recommendations  Diet recommendations: Dysphagia 1 (puree);Nectar-thick liquid Liquids provided via: Teaspoon Medication Administration: Crushed with puree Supervision: Full supervision/cueing for compensatory strategies Compensations: Minimize environmental distractions;Slow rate;Small sips/bites Postural Changes and/or Swallow Maneuvers: Seated upright 90 degrees                  Oral care QID   Frequent or constant Supervision/Assistance Dysphagia, unspecified (R13.10);Cognitive communication deficit (R41.841);Aphasia (R47.01)     Continue with current plan of care     [name]  08/03/2022, 9:13 AM

## 2022-08-03 NOTE — Plan of Care (Signed)
  Problem: Education: Goal: Knowledge of disease or condition will improve Outcome: Progressing Goal: Knowledge of secondary prevention will improve (MUST DOCUMENT ALL) Outcome: Progressing Goal: Knowledge of patient specific risk factors will improve Loraine Leriche N/A or DELETE if not current risk factor) Outcome: Progressing   Problem: Intracerebral Hemorrhage Tissue Perfusion: Goal: Complications of Intracerebral Hemorrhage will be minimized Outcome: Progressing   Problem: Health Behavior/Discharge Planning: Goal: Goals will be collaboratively established with patient/family Outcome: Progressing   Problem: Self-Care: Goal: Ability to participate in self-care as condition permits will improve Outcome: Progressing Goal: Verbalization of feelings and concerns over difficulty with self-care will improve Outcome: Progressing Goal: Ability to communicate needs accurately will improve Outcome: Progressing   Problem: Nutrition: Goal: Risk of aspiration will decrease Outcome: Progressing Goal: Dietary intake will improve Outcome: Progressing

## 2022-08-03 NOTE — Progress Notes (Signed)
Occupational Therapy Treatment Patient Details Name: [name] [name] MRN: [id number] DOB: 1950-03-25 Today's Date: 08/03/2022   History of present illness 72 y.o. female who presents 07/23/22 after being found in the fetal position with rhythmic jerking movements of the bilateral upper extremities. CT acute IPH in right basal ganglia with possible IVH; long-term EEG ordered; NIHSS=24;   PMH significant of: hypertension, hyperlipidemia, STEMI, CABG, tobacco use, seizures, 4 strokes (including L MCA) and atrial fibrillation on Eliquis   OT comments  Patient position in bed facing right and BLE knees flexed. Stretching performed to BLE knees prior to getting to EOB. Patient was total assist to get to EOB and mod assist for sitting balance with assistance needed for head alignment. Patient initially agreeable to attempt standing but declined once attempt was made. Patient positioned in bed on right side at end of session. Patient with limited progress this session and will continue to be followed by acute OT. Patient will benefit from continued inpatient follow up therapy, <3 hours/day.    Recommendations for follow up therapy are one component of a multi-disciplinary discharge planning process, led by the attending physician.  Recommendations may be updated based on patient status, additional functional criteria and insurance authorization.    Assistance Recommended at Discharge Frequent or constant Supervision/Assistance  Patient can return home with the following  Two people to help with walking and/or transfers;Two people to help with bathing/dressing/bathroom   Equipment Recommendations  Wheelchair (measurements OT);Wheelchair cushion (measurements OT);Hospital bed;Other (comment) (hoyer)    Recommendations for Other Services      Precautions / Restrictions Precautions Precautions: Fall Precaution Comments: seizure, Cortrak, concerns for pressure wound due to positioning Restrictions Weight  Bearing Restrictions: No       Mobility Bed Mobility Overal bed mobility: Needs Assistance Bed Mobility: Rolling, Sidelying to Sit, Sit to Sidelying Rolling: Total assist, +2 for physical assistance Sidelying to sit: Total assist, +2 for physical assistance   Sit to supine: Total assist, +2 for physical assistance   General bed mobility comments: required assistance to roll for positioning due tto stiffness    Transfers                   General transfer comment: not appropriate     Balance Overall balance assessment: Needs assistance Sitting-balance support: Feet supported Sitting balance-Leahy Scale: Poor Sitting balance - Comments: required assistance for head alignment and trunk flexion. posterior leaning while on EOB with mod assist for balance Postural control: Posterior lean                                 ADL either performed or assessed with clinical judgement   ADL Overall ADL's : Needs assistance/impaired                     Lower Body Dressing: Total assistance;Bed level Lower Body Dressing Details (indicate cue type and reason): to donn socks               General ADL Comments: focused on sitting balance and positioning    Extremity/Trunk Assessment              Vision       Perception     Praxis      Cognition Arousal/Alertness: Awake/alert Behavior During Therapy: Flat affect Overall Cognitive Status: Impaired/Different from baseline  General Comments: appeared fearful off falling, holding onto rail and not wanting to let go.  Stated "what's the point while on EOB"        Exercises      Shoulder Instructions       General Comments      Pertinent Vitals/ Pain       Pain Assessment Pain Assessment: Faces Faces Pain Scale: Hurts a little bit Pain Location: generalized with movemen, BLE knees with extention Pain Descriptors / Indicators:  Grimacing Pain Intervention(s): Monitored during session, Repositioned  Home Living                                          Prior Functioning/Environment              Frequency  Min 2X/week        Progress Toward Goals  OT Goals(current goals can now be found in the care plan section)  Progress towards OT goals: Progressing toward goals  Acute Rehab OT Goals OT Goal Formulation: Patient unable to participate in goal setting Time For Goal Achievement: 08/09/22 Potential to Achieve Goals: Good ADL Goals Pt Will Perform Grooming: with max assist;bed level Additional ADL Goal #1: Pt will tolerate EOB sitting max (A) as precursor to adls. Additional ADL Goal #2: pt will demonstrate full trunk extension in supine for 2 minutes  Plan Discharge plan remains appropriate    Co-evaluation    PT/OT/SLP Co-Evaluation/Treatment: Yes Reason for Co-Treatment: Complexity of the patient's impairments (multi-system involvement);For patient/therapist safety   OT goals addressed during session: Strengthening/ROM      AM-PAC OT "6 Clicks" Daily Activity     Outcome Measure   Help from another person eating meals?: Total Help from another person taking care of personal grooming?: Total Help from another person toileting, which includes using toliet, bedpan, or urinal?: Total Help from another person bathing (including washing, rinsing, drying)?: Total Help from another person to put on and taking off regular upper body clothing?: Total Help from another person to put on and taking off regular lower body clothing?: Total 6 Click Score: 6    End of Session Equipment Utilized During Treatment: Gait belt  OT Visit Diagnosis: Muscle weakness (generalized) (M62.81)   Activity Tolerance Patient tolerated treatment well   Patient Left in bed;with call bell/phone within reach;with bed alarm set   Nurse Communication Mobility status;Precautions        Time:  4098-1191 OT Time Calculation (min): 28 min  Charges: OT General Charges $OT Visit: 1 Visit OT Treatments $Therapeutic Activity: 8-22 mins  [name] [name], OTA Acute Rehabilitation Services  Office [phone]   Dewain Penning 08/03/2022, 10:16 AM

## 2022-08-03 NOTE — Progress Notes (Signed)
Received notification from palliative care team that patient's family does not desire PEG placement any longer. Trauma team is available if family changes their mind.   [name], [facility] Surgery 08/03/2022, 12:44 PM Please see Amion for pager number during day hours 7:00am-4:30pm

## 2022-08-04 LAB — GLUCOSE, CAPILLARY
Glucose-Capillary: 104 mg/dL — ABNORMAL HIGH (ref 70–99)
Glucose-Capillary: 112 mg/dL — ABNORMAL HIGH (ref 70–99)
Glucose-Capillary: 114 mg/dL — ABNORMAL HIGH (ref 70–99)
Glucose-Capillary: 122 mg/dL — ABNORMAL HIGH (ref 70–99)
Glucose-Capillary: 144 mg/dL — ABNORMAL HIGH (ref 70–99)

## 2022-08-04 NOTE — Plan of Care (Signed)
  Problem: Education: Goal: Knowledge of disease or condition will improve Outcome: Progressing Goal: Knowledge of secondary prevention will improve (MUST DOCUMENT ALL) Outcome: Progressing Goal: Knowledge of patient specific risk factors will improve (Mark N/A or DELETE if not current risk factor) Outcome: Progressing   Problem: Intracerebral Hemorrhage Tissue Perfusion: Goal: Complications of Intracerebral Hemorrhage will be minimized Outcome: Progressing   Problem: Health Behavior/Discharge Planning: Goal: Goals will be collaboratively established with patient/family Outcome: Progressing   Problem: Self-Care: Goal: Ability to participate in self-care as condition permits will improve Outcome: Progressing Goal: Verbalization of feelings and concerns over difficulty with self-care will improve Outcome: Progressing Goal: Ability to communicate needs accurately will improve Outcome: Progressing   Problem: Nutrition: Goal: Risk of aspiration will decrease Outcome: Progressing Goal: Dietary intake will improve Outcome: Progressing   

## 2022-08-04 NOTE — Progress Notes (Signed)
Patient's NGT (cortrak) removed, per order. Patient tolerated fairly well. Safety precautions in place. Call bell within reach. Bed in lowest position and locked.

## 2022-08-04 NOTE — Progress Notes (Addendum)
STROKE TEAM PROGRESS NOTE   INTERVAL HISTORY Patient is seen in her room with no family at the bedside.  She remains hemodynamically stable, and her neurological exam is unchanged.  Plan to monitor intake until Friday and then D/C to SNF. Family would like to look at other offers besides [facility]. Please see case management note from 08/04/22. She is currently eating 100% of meals that she is hand fed. Cortrak removed today.   Vitals:   08/04/22 0328 08/04/22 0737 08/04/22 0747 08/04/22 1100  BP: 122/66 127/77  121/76  Pulse: 72 61 74 60  Resp: 16 15 (!) 22 20  Temp: 98 F (36.7 C) 98.1 F (36.7 C) 97.9 F (36.6 C) (!) 97.2 F (36.2 C)  TempSrc:  Oral Axillary Axillary  SpO2: 100% 100%  100%  Weight:      Height:       CBC:  No results for input(s): "WBC", "NEUTROABS", "HGB", "HCT", "MCV", "PLT" in the last 168 hours.  Basic Metabolic Panel:  Recent Labs  Lab 07/29/22 1137 07/29/22 1541 07/30/22 0634  NA 133*  --  134*  K 4.5  --  4.8  CL 102  --  100  CO2 24  --  25  GLUCOSE 101*  --  150*  BUN 24*  --  26*  CREATININE 0.73  --  0.75  CALCIUM 8.5*  --  8.6*  MG  --  1.7  --     Lipid Panel:  No results for input(s): "CHOL", "TRIG", "HDL", "CHOLHDL", "VLDL", "LDLCALC" in the last 168 hours.  HgbA1c:  No results for input(s): "HGBA1C" in the last 168 hours.   IMAGING past 24 hours No results found.  PHYSICAL EXAM Constitutional: Cachetic, ill-appearing patient in no acute distress Cardiovascular: Normal rate and regular rhythm.  Respiratory: Effort normal and regular on room air  GI: Soft.  No distension. There is no tenderness.  Skin: WDI MSK: Diffuse muscle wasting   Neuro: Patient lying bed, eyes open, does not respond to name but will make eye contact with and track examiner.  She does not follow commands today. Global aphasia No significant gaze preference today, moving eyes bilaterally.   Left facial droop. Tongue protrusion not cooperative.   Will  move all 4 extremities spontaneously but not to command. Sensation, coordination and gait not tested.   ASSESSMENT/PLAN [name] is a 72 y.o. female with history of HTN, HLD, STEMI, CABG, tobacco use, seizures, 4 strokes with most recent last month, Afib on Eliquis who presented 6/21 AM after being found with rhythmic jerking movements of the bilateral upper extremities.  She was found to have a right MCA stroke with hemorrhagic conversion.  Stroke with hemorrhagic conversion - right MCA moderate and left frontal punctate infarcts with right BG hemorrhagic conversion, likely due to A-fib even on Eliquis CT head Acute 1.1 x 0.9 x 1.2 cm intraparenchymal hemorrhage in the right basal ganglia. Possible trace intraventricular extension of hemorrhage of the right lateral ventricle. Remote left MCA and PCA territory infarct CTA head & neck Similar occlusion of a right proximal M2 MCA branch. Similar occlusion of the left ICA in the neck with poor opacification of small left MCA and ACA vessels. Similar occluded right vertebral artery. Similar severe stenosis of the left vertebral artery origin Repeat CT 6/21: Stable hematoma MRI  Acute right MCA distribution infarct underlies the petechial hemorrhage in the right putamen. Tiny acute infarct in the superior left frontal lobe. 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  Repeat CT 6/27 patchy right MCA infarct and stable petechial hemorrhage in right basal ganglia with 1 mm midline shift 2D Echo 06/23/22 EF 40-45% LDL 24 HgbA1c 4.9 UDS pending VTE prophylaxis - SCDs Eliquis (apixaban) daily prior to admission, now on aspirin 81 mg daily Therapy recommendations: SNF Disposition:  pending, palliative care on board, continue full code for now  Seizure Presented with unresponsiveness with bilateral upper extremity jerking Received Versed for seizure activity EEG 6/21: suggestive of cortical dysfunction, likely  secondary to underlying ICH.  EEG 6/22: suggestive of cortical dysfunction arising from right frontotemporal region, likely secondary to underlying ICH. No seizures.  LTM EEG discontinued Continue Keppra 500mg  BID  History of stroke 08/2012 had left MCA stroke, put on loop recorder 2015 found to have A-fib on loop recorder, started on Eliquis 03/2017 admitted for difficulty speaking.  CT no acute abnormality.  CTA head and neck right ICA 80% stenosis, left VA origin severe stenosis and right VA occlusion, as well as significant intracranial stenosis.  Carotid Doppler negative.  MRI showed left MCA infarct and old large left MCA and PCA infarcts.  LDL 53, A1c 5.6.  2D echo EF normal range.  Continue on Eliquis given noncompliance PTA.  Has residual mild expressive aphasia 06/2022 again admitted for worsening aphasia.  CT head and neck found new left ICA occlusion.  Right ICA 75% stenosis, unchanged vertebral arteries as above.  MRI showed left MCA small high convexity infarct.  EF 40 to 45%.  Continue on Eliquis.  Ventricular tachycardia On 6/26, patient had a 1 minute episode of V. tach which self terminated Cardiology consulted, appreciate input Put on amiodarione Continue metoprolol Not candidate for ICD, defer ischemic evaluation for now per card  PAF Found in 2015 by loop recorder On Eliquis PTA Currently AC on hold due to hemorrhagic conversion Repeat CT was stable, aspirin 81 mg daily started Resumed home metoprolol  Hypertension Home meds:  Toprol-XL 25mg  PRN Labetalol and Hydralazine IV  On metoprolol 25 twice daily BP goal < 160 Long-term BP goal normotensive  Hyperlipidemia Home meds:  Lipitor 80mg , Zetia 10mg  LDL 24, goal < 70 Lipitor to 40 given low LDL Continue statin at discharge  Dysphagia Dysphagia 1 diet with nectar thick liquid Cortrak removed 7/3 Encourage po intake - not adequate yet Family has decided against PEG placement Calorie count 6/27 - 7/1 Intake  is improving   Tobacco abuse Current smoker Smoking cessation counseling will be provided  Other Stroke Risk Factors Advanced Age >/= 48  CAD/STEMI status post CABG Family hx stroke (brother)  Other Active Problems ? Medication non-compliance Dementia -restarted home Aricept  Hospital day # 12  Patient seen and examined by NP/APP with MD. MD to update note as needed.   Elmer Picker, DNP, FNP-BC [facility] Pager: [phone]  I have personally obtained history,examined this patient, reviewed notes, independently viewed imaging studies, participated in medical decision making and plan of care.ROS completed by me personally and pertinent positives fully documented  I have made any additions or clarifications directly to the above note. Agree with note above.  Patient is medically stable and plan was to discharge her to skilled nursing facility today however family has changed decision and would prefer she go to a different skilled nursing facility.  Patient is eating better and core track tube will be removed today.  Delia Heady, MD Medical Director [facility]  Pager: [phone] 08/04/2022 3:38 PM   To contact Stroke Continuity provider, please refer to [email or web address]. After hours, contact General Neurology

## 2022-08-04 NOTE — Discharge Summary (Addendum)
Stroke Discharge Summary  Patient ID: [name] [name]   MRN: [id number]      DOB: [birth year]  Date of Admission: 07/23/2022 Date of Discharge: 08/06/2022  Attending Physician:  Stroke, Md, MD, Stroke MD Consultant(s):    cardiology, palliative care Patient's PCP:  Preston Fleeting, MD  DISCHARGE DIAGNOSIS: Stroke with hemorrhagic conversion - right MCA moderate and left frontal punctate infarcts with right BG hemorrhagic conversion, likely due to A-fib even on Eliquis  Seizure V-tach Hypertension Hyperlipidemia Principal Problem:   ICH (intracerebral hemorrhage) (HCC)   Allergies as of 08/06/2022   No Known Allergies      Medication List     STOP taking these medications    apixaban 5 MG Tabs tablet Commonly known as: Eliquis   metoprolol succinate 25 MG 24 hr tablet Commonly known as: TOPROL-XL   Vitamin D (Ergocalciferol) 1.25 MG (50000 UNIT) Caps capsule Commonly known as: DRISDOL       TAKE these medications    acetaminophen 325 MG tablet Commonly known as: TYLENOL Take 2 tablets (650 mg total) by mouth every 4 (four) hours as needed for mild pain (or temp > 37.5 C (99.5 F)).   amiodarone 400 MG tablet Commonly known as: PACERONE Take 1 tablet (400 mg total) by mouth daily. Start taking on: August 07, 2022   aspirin 81 MG chewable tablet Chew 1 tablet (81 mg total) by mouth daily. Start taking on: August 07, 2022   atorvastatin 40 MG tablet Commonly known as: LIPITOR Take 1 tablet (40 mg total) by mouth daily. Start taking on: August 07, 2022 What changed:  medication strength how much to take   donepezil 5 MG tablet Commonly known as: ARICEPT Take 1 tablet (5 mg total) by mouth at bedtime.   ezetimibe 10 MG tablet Commonly known as: ZETIA Take 1 tablet (10 mg total) by mouth daily.   levETIRAcetam 500 MG tablet Commonly known as: KEPPRA Take 1 tablet (500 mg total) by mouth 2 (two) times daily.   metoprolol tartrate 25 MG tablet Commonly  known as: LOPRESSOR Take 1 tablet (25 mg total) by mouth 2 (two) times daily.   multivitamin with minerals Tabs tablet Take 1 tablet by mouth daily.   polyethylene glycol 17 g packet Commonly known as: MIRALAX / GLYCOLAX Take 17 g by mouth daily as needed for mild constipation, moderate constipation or severe constipation.   senna-docusate 8.6-50 MG tablet Commonly known as: Senokot-S Take 1 tablet by mouth 2 (two) times daily.               Discharge Care Instructions  (From admission, onward)           Start     Ordered   08/06/22 0000  Discharge wound care:       Comments: Apply silicone foam dressing and offload pressure   08/06/22 1226            LABORATORY STUDIES CBC    Component Value Date/Time   WBC 7.3 07/28/2022 0417   RBC 3.98 07/28/2022 0417   HGB 12.2 07/28/2022 0417   HCT 36.0 07/28/2022 0417   PLT 215 07/28/2022 0417   MCV 90.5 07/28/2022 0417   MCH 30.7 07/28/2022 0417   MCHC 33.9 07/28/2022 0417   RDW 14.0 07/28/2022 0417   LYMPHSABS 1.2 07/28/2022 0417   MONOABS 0.8 07/28/2022 0417   EOSABS 0.0 07/28/2022 0417   BASOSABS 0.0 07/28/2022 0417   CMP  Component Value Date/Time   NA 134 (L) 07/30/2022 0634   K 4.8 07/30/2022 0634   CL 100 07/30/2022 0634   CO2 25 07/30/2022 0634   GLUCOSE 150 (H) 07/30/2022 0634   BUN 26 (H) 07/30/2022 0634   CREATININE 0.75 07/30/2022 0634   CALCIUM 8.6 (L) 07/30/2022 0634   PROT 6.7 07/24/2022 0453   ALBUMIN 3.2 (L) 07/24/2022 0453   AST 72 (H) 07/24/2022 0453   ALT 30 07/24/2022 0453   ALKPHOS 61 07/24/2022 0453   BILITOT 1.2 07/24/2022 0453   GFRNONAA >60 07/30/2022 0634   GFRAA >60 03/15/2017 0616   COAGS Lab Results  Component Value Date   INR 1.1 07/23/2022   INR 1.1 06/22/2022   INR 0.95 03/10/2017   Lipid Panel    Component Value Date/Time   CHOL 136 07/24/2022 0453   TRIG 304 (H) 07/24/2022 0453   HDL 51 07/24/2022 0453   CHOLHDL 2.7 07/24/2022 0453   VLDL 61 (H)  07/24/2022 0453   LDLCALC 24 07/24/2022 0453   HgbA1C  Lab Results  Component Value Date   HGBA1C 4.9 07/24/2022   Urinalysis    Component Value Date/Time   COLORURINE STRAW (A) 06/22/2022 1100   APPEARANCEUR CLEAR (A) 06/22/2022 1100   LABSPEC 1.043 (H) 06/22/2022 1100   PHURINE 7.0 06/22/2022 1100   GLUCOSEU NEGATIVE 06/22/2022 1100   HGBUR SMALL (A) 06/22/2022 1100   BILIRUBINUR NEGATIVE 06/22/2022 1100   KETONESUR NEGATIVE 06/22/2022 1100   PROTEINUR NEGATIVE 06/22/2022 1100   UROBILINOGEN 4.0 (H) 08/16/2012 0205   NITRITE NEGATIVE 06/22/2022 1100   LEUKOCYTESUR NEGATIVE 06/22/2022 1100   Urine Drug Screen     Component Value Date/Time   LABOPIA NONE DETECTED 08/16/2012 0205   COCAINSCRNUR NONE DETECTED 08/16/2012 0205   LABBENZ POSITIVE (A) 08/16/2012 0205   AMPHETMU NONE DETECTED 08/16/2012 0205   THCU NONE DETECTED 08/16/2012 0205   LABBARB NONE DETECTED 08/16/2012 0205    Alcohol Level    Component Value Date/Time   ETH <10 07/23/2022 1203     SIGNIFICANT DIAGNOSTIC STUDIES CT HEAD WO CONTRAST ( )  Result Date: 07/29/2022 CLINICAL DATA:  72 year old female with right MCA infarct, petechial hemorrhage in the basal ganglia. Subsequent encounter. EXAM: CT HEAD WITHOUT CONTRAST TECHNIQUE: Contiguous axial images were obtained from the base of the skull through the vertex without intravenous contrast. RADIATION DOSE REDUCTION: This exam was performed according to the departmental dose-optimization program which includes automated exposure control, adjustment of the mA and/or kV according to patient size and/or use of iterative reconstruction technique. COMPARISON:  Head CT 07/25/2022, brain MRI 07/24/2022 and earlier. FINDINGS: Brain: Small but confluent hemorrhage in the right lentiform, 15 mm series 7, image 22) has not significantly changed in size or configuration since 07/23/2022. Surrounding heterogeneous cytotoxic edema redemonstrated. Mild leftward midline  shift of 3-4 mm, is only 1 mm greater since presentation and appears more related to the infarct edema. Mild mass effect on the right lateral ventricle. Contralateral left lateral ex vacuo ventriculomegaly with confluent, multifocal left hemisphere chronic encephalomalacia. No new intracranial hemorrhage. Basilar cisterns remain normal. No new cortically based infarct identified. Vascular: Calcified atherosclerosis at the skull base. Skull: Intact. Sinuses/Orbits: Visualized paranasal sinuses and mastoids are stable and well aerated. Other: Left nasoenteric tube partially visible. No acute orbit or scalp soft tissue finding. IMPRESSION: 1. Expected appearance of patchy Right MCA infarct and stable petechial hemorrhage in the right basal ganglia. 2. Mild leftward midline shift is 1 mm greater  since presentation and appears more related to the infarct edema. 3. No new intracranial abnormality. Electronically Signed   By: Odessa Fleming M.D.   On: 07/29/2022 07:14   DG Abd Portable 1V  Result Date: 07/26/2022 CLINICAL DATA:  Feeding tube placement EXAM: PORTABLE ABDOMEN - 1 VIEW COMPARISON:  KUB 07/23/2022 FINDINGS: The enteric catheter tip projects over left abdomen, likely in the stomach. There is mild gaseous distention of the bowel without evidence of mechanical obstruction there is no definite free intraperitoneal air, within the confines of supine technique. There is retrocardiac opacity which appears slightly worsened compared to the prior study. Median sternotomy wires and a cardiac loop recorder device are again noted. There is no acute osseous abnormality. IMPRESSION: 1. Enteric catheter projects over the left hemiabdomen likely in the stomach. 2. Retrocardiac opacity appears slightly worsened since the prior study and may reflect atelectasis or developing infection. Electronically Signed   By: Lesia Hausen M.D.   On: 07/26/2022 11:17   CT HEAD WO CONTRAST ( )  Result Date: 07/25/2022 CLINICAL DATA:   Stroke follow-up EXAM: CT HEAD WITHOUT CONTRAST TECHNIQUE: Contiguous axial images were obtained from the base of the skull through the vertex without intravenous contrast. RADIATION DOSE REDUCTION: This exam was performed according to the departmental dose-optimization program which includes automated exposure control, adjustment of the mA and/or kV according to patient size and/or use of iterative reconstruction technique. COMPARISON:  Brain MRI from yesterday and head CT from 2 days ago FINDINGS: Brain: Cytotoxic edema in the right MCA distribution from acute infarcts, extent better assessed on recent MRI, most confluent at the insula and opercula. No evidence of progression. Hemorrhage at the right putamen measuring 13 mm is unchanged. Large remote left MCA and PCA distribution infarcts affecting cortex. No hydrocephalus or shift. Vascular: No acute finding Skull: Unremarkable Sinuses/Orbits: Gaze to the right. IMPRESSION: 1. No progression of right MCA distribution infarct with putamen hemorrhage when compared to brain MRI yesterday. 2. Large remote left frontal parietal and occipital cortex infarcts. Electronically Signed   By: Tiburcio Pea M.D.   On: 07/25/2022 06:22   Overnight EEG with video  Result Date: 07/24/2022 Charlsie Quest, MD     07/25/2022  8:04 AM Patient Name: DENESHA DENK MRN: [id number] Epilepsy Attending: Charlsie Quest Referring Physician/Provider: Marjorie Smolder, NP Duration: 07/23/2022 1457 to 07/24/2022 1457 Patient history: 72yo F with right BG ICH getting eeg to evaluate for seizure Level of alertness: Awake, asleep AEDs during EEG study: LEV Technical aspects: This EEG study was done with scalp electrodes positioned according to the 10-20 International system of electrode placement. Electrical activity was reviewed with band pass filter of 1-70Hz , sensitivity of 7 uV/mm, display speed of 71mm/sec with a 60Hz  notched filter applied as appropriate. EEG data were  recorded continuously and digitally stored.  Video monitoring was available and reviewed as appropriate. Description: The posterior dominant rhythm consists of 8.5 Hz activity of moderate voltage (25-35 uV) seen predominantly in posterior head regions, symmetric and reactive to eye opening and eye closing. Sleep was characterized by vertex waves, sleep spindles (12 to 14 Hz), maximal frontocentral region. EEG showed continuous 3 to 6 Hz theta-delta slowing in right frontotemporal region. Hyperventilation and photic stimulation were not performed.   ABNORMALITY - Continuous slow, right frontotemporal region IMPRESSION: This study is suggestive of cortical dysfunction arising from right frontotemporal region, likely secondary to underlying ICH. No seizures or epileptiform discharges were seen throughout the recording.  Charlsie Quest   MR BRAIN W WO CONTRAST  Result Date: 07/24/2022 CLINICAL DATA:  Hemorrhagic stroke workup EXAM: MRI HEAD WITHOUT AND WITH CONTRAST TECHNIQUE: Multiplanar, multiecho pulse sequences of the brain and surrounding structures were obtained without and with intravenous contrast. CONTRAST:  4mL GADAVIST GADOBUTROL 1 MMOL/ML IV SOLN COMPARISON:  Head CT from yesterday FINDINGS: Brain: Restricted diffusion in the right MCA distribution affecting frontotemporal cortex and the stratum with superimposed known hemorrhage in the right basal ganglia measuring 15 mm. Small superior left frontal cortex acute infarct. Chronic left occipital and parietal infarcts with milder patchy involvement along the superior left frontal convexity. Chronic lacunar infarct at the left thalamus. No hydrocephalus, extra-axial collection, or masslike finding. Progressively motion degraded exam. Vascular: Loss of left ICA flow void at the skull base with intracranial reconstitution, known from prior imaging Skull and upper cervical spine: Unremarkable Sinuses/Orbits: No acute finding IMPRESSION: 1. Acute right MCA  distribution infarct underlies the petechial hemorrhage in the right putamen. Tiny acute infarct in the superior left frontal lobe. 2. Extensive chronic ischemic injury especially in the left cerebral hemisphere. 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 3. Progressively motion degraded study. Electronically Signed   By: Tiburcio Pea M.D.   On: 07/24/2022 07:03   CT HEAD WO CONTRAST  Result Date: 07/23/2022 CLINICAL DATA:  Intracranial hemorrhage EXAM: CT HEAD WITHOUT CONTRAST TECHNIQUE: Contiguous axial images were obtained from the base of the skull through the vertex without intravenous contrast. RADIATION DOSE REDUCTION: This exam was performed according to the departmental dose-optimization program which includes automated exposure control, adjustment of the mA and/or kV according to patient size and/or use of iterative reconstruction technique. COMPARISON:  None Available. FINDINGS: Brain: Expected evolution of right basal ganglia intraparenchymal hemorrhage. Unchanged posterior left hemisphere encephalomalacia. No midline shift or hydrocephalus. Vascular: No abnormal hyperdensity of the major intracranial arteries or dural venous sinuses. No intracranial atherosclerosis. Skull: The visualized skull base, calvarium and extracranial soft tissues are normal. Sinuses/Orbits: No fluid levels or advanced mucosal thickening of the visualized paranasal sinuses. No mastoid or middle ear effusion. The orbits are normal. IMPRESSION: 1. Expected evolution of right basal ganglia intraparenchymal hemorrhage. 2. Unchanged posterior left hemisphere encephalomalacia. Electronically Signed   By: Deatra Robinson M.D.   On: 07/23/2022 18:45   DG Pelvis Portable  Result Date: 07/23/2022 CLINICAL DATA:  Screening for metallic foreign body before MRI EXAM: PORTABLE PELVIS 1-2 VIEWS COMPARISON:  None Available. FINDINGS: There is contrast in the urinary bladder from previous CT. No metallic foreign bodies are seen.  Degenerative changes are noted in lower lumbar spine. Arterial calcifications are seen. IMPRESSION: No metallic foreign bodies are seen.  Lumbar spondylosis. Electronically Signed   By: Ernie Avena M.D.   On: 07/23/2022 18:38   DG Abd 1 View  Result Date: 07/23/2022 CLINICAL DATA:  Screening for metallic foreign body before MRI EXAM: ABDOMEN - 1 VIEW COMPARISON:  None Available. FINDINGS: There are metallic sutures in the sternum. There is previous coronary bypass surgery. No definite metallic foreign bodies are seen in abdomen and pelvis. There is contrast in the pelvocaliceal systems and urinary bladder from previous CTA. Extensive calcifications are seen in abdominal aorta. There is possible contracted gallbladder filled with stones. There is implantable cardiac monitoring device in left chest wall. IMPRESSION: No definite metallic densities are seen in abdomen or pelvis. Previous cardiac bypass. There is implantable cardiac monitoring device in left chest wall. Arteriosclerosis. Possible contracted gallbladder filled with stones.  Electronically Signed   By: Ernie Avena M.D.   On: 07/23/2022 18:37   DG CHEST PORT 1 VIEW  Result Date: 07/23/2022 CLINICAL DATA:  Altered level of consciousness EXAM: PORTABLE CHEST 1 VIEW COMPARISON:  11/03/2021 FINDINGS: Single frontal view of the chest demonstrates stable postsurgical changes from CABG. Cardiac silhouette is unremarkable. No acute airspace disease, effusion, or pneumothorax. No acute bony abnormalities. Loop recorder overlies left chest. IMPRESSION: 1. No acute intrathoracic process. Electronically Signed   By: Sharlet Salina M.D.   On: 07/23/2022 17:23   CT Angio Head Neck W WO CM  Result Date: 07/23/2022 CLINICAL DATA:  Neuro deficit, acute, stroke suspected unknown last known well EXAM: CT ANGIOGRAPHY HEAD AND NECK WITH AND WITHOUT CONTRAST TECHNIQUE: Multidetector CT imaging of the head and neck was performed using the standard  protocol during bolus administration of intravenous contrast. Multiplanar CT image reconstructions and MIPs were obtained to evaluate the vascular anatomy. Carotid stenosis measurements (when applicable) are obtained utilizing NASCET criteria, using the distal internal carotid diameter as the denominator. RADIATION DOSE REDUCTION: This exam was performed according to the departmental dose-optimization program which includes automated exposure control, adjustment of the mA and/or kV according to patient size and/or use of iterative reconstruction technique. CONTRAST:  75 mL of Omnipaque 350 IV. COMPARISON:  CT head and CTA head/neck Jun 22, 2022. FINDINGS: CT HEAD FINDINGS Brain: Acute 1.1 x 0.9 x 1.2 cm intraparenchymal hemorrhage in the right basal ganglia (estimated volume of 0.6 mL). Possible trace intraventricular extension of hemorrhage within the body of the right lateral ventricle. Multiple remote infarcts in the left MCA and PCA territories. No evidence of acute large vascular territory infarct. No midline shift. No hydrocephalus. Vascular: See below. Skull: No acute fracture. Sinuses/Orbits: Clear visualized sinuses. No acute orbital findings. Other: No mastoid effusions. Review of the MIP images confirms the above findings CTA NECK FINDINGS Aortic arch: Great vessel origins are incompletely imaged. Visualized great vessel origins are patent with calcific atherosclerosis. Right carotid system: Similar dense calcific atherosclerosis involving the common carotid artery, carotid bifurcation and ICA with approximately 75% stenosis of the cervical ICA. Left carotid system: Similar dense calcified atherosclerosis of the common carotid artery with occlusion of the left cervical ICA. 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 Similar severe stenosis of the left vertebral artery origin. Skeleton: No acute findings on limited assessment. Other neck: No acute findings  on limited assessment. Upper chest: Emphysema.  Aortic atherosclerosis. Review of the MIP images confirms the above findings CTA HEAD FINDINGS Anterior circulation: Non-opacified left intracranial ICA, chronic. Poorly opacified in small left MCA and ACA, similar. Right M1 MCAs patent. Similar occlusion of a proximal right M2 MCA branch. Posterior circulation: Poor flow within the right vertebral artery due to above. Left vertebral artery, Venous sinuses: Not well assessed due to arterial timing. Review of the MIP images confirms the above findings IMPRESSION: CT head: 1. Acute 1.1 x 0.9 x 1.2 cm intraparenchymal hemorrhage in the right basal ganglia. 2. Possible trace intraventricular extension of hemorrhage within the body of the right lateral ventricle. 3. Remote left MCA and PCA territory infarcts. No definite evidence of new/interval acute large vascular territory infarct; however, MRI could provide more sensitive evaluation if clinically warranted. CTA: 1. Similar occlusion of a right proximal M2 MCA branch. 2. Similar occlusion of the left ICA in the neck with poor opacification of small left MCA and ACA vessels. 3. Similar occluded right  vertebral artery. 4. Similar severe stenosis of the left vertebral artery origin. 5. Aortic Atherosclerosis (ICD10-I70.0) and Emphysema (ICD10-J43.9). Findings discussed with Patrice Paradise, PA via telephone at 1:24 p.m. Electronically Signed   By: Feliberto Harts M.D.   On: 07/23/2022 13:28      HISTORY OF PRESENT ILLNESS 72 year old patient with history of hypertension, hyperlipidemia, STEMI, CABG, smoking, seizures, A-fib on Eliquis and four strokes presented 6/21 with rhythmic jerking movements of the bilateral upper extremities.  HOSPITAL COURSE Patient was treated for seizure activity and placed on Keppra.  Imaging revealed right MCA territory stroke with hemorrhagic conversion.  Eliquis was not restarted due to hemorrhagic conversion, and patient was placed on  aspirin.  On 6/26, she had a 1 minute episode of V. tach which was self terminating, and she was placed on amiodarone.  There was some consideration for placing a PEG tube, but patient's oral intake improved with careful hand feeding.  She is now stable and ready to be discharged to a skilled nursing facility. Stroke with hemorrhagic conversion - right MCA moderate and left frontal punctate infarcts with right BG hemorrhagic conversion, likely due to A-fib even on Eliquis CT head Acute 1.1 x 0.9 x 1.2 cm intraparenchymal hemorrhage in the right basal ganglia. Possible trace intraventricular extension of hemorrhage of the right lateral ventricle. Remote left MCA and PCA territory infarct CTA head & neck Similar occlusion of a right proximal M2 MCA branch. Similar occlusion of the left ICA in the neck with poor opacification of small left MCA and ACA vessels. Similar occluded right vertebral artery. Similar severe stenosis of the left vertebral artery origin Repeat CT 6/21: Stable hematoma MRI  Acute right MCA distribution infarct underlies the petechial hemorrhage in the right putamen. Tiny acute infarct in the superior left frontal lobe. 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  Repeat CT 6/27 patchy right MCA infarct and stable petechial hemorrhage in right basal ganglia with 1 mm midline shift 2D Echo 06/23/22 EF 40-45% LDL 24 HgbA1c 4.9 UDS pending VTE prophylaxis - SCDs Eliquis (apixaban) daily prior to admission, now on aspirin 81 mg daily Therapy recommendations: SNF Disposition:  pending, palliative care on board, continue full code for now   Seizure Presented with unresponsiveness with bilateral upper extremity jerking Received Versed for seizure activity EEG 6/21: suggestive of cortical dysfunction, likely secondary to underlying ICH.  EEG 6/22: suggestive of cortical dysfunction arising from right frontotemporal region, likely secondary to underlying  ICH. No seizures.  LTM EEG discontinued Continue Keppra 500mg  BID   History of stroke 08/2012 had left MCA stroke, put on loop recorder 2015 found to have A-fib on loop recorder, started on Eliquis 03/2017 admitted for difficulty speaking.  CT no acute abnormality.  CTA head and neck right ICA 80% stenosis, left VA origin severe stenosis and right VA occlusion, as well as significant intracranial stenosis.  Carotid Doppler negative.  MRI showed left MCA infarct and old large left MCA and PCA infarcts.  LDL 53, A1c 5.6.  2D echo EF normal range.  Continue on Eliquis given noncompliance PTA.  Has residual mild expressive aphasia 06/2022 again admitted for worsening aphasia.  CT head and neck found new left ICA occlusion.  Right ICA 75% stenosis, unchanged vertebral arteries as above.  MRI showed left MCA small high convexity infarct.  EF 40 to 45%.  Continue on Eliquis.   Ventricular tachycardia On 6/26, patient had a 1 minute episode of V.  tach which self terminated Cardiology consulted, appreciate input Put on amiodarione Continue metoprolol Not candidate for ICD, defer ischemic evaluation for now per card   PAF Found in 2015 by loop recorder On Eliquis PTA Currently AC on hold due to hemorrhagic conversion Repeat CT was stable, aspirin 81 mg daily started Resumed home metoprolol   Hypertension Home meds:  Toprol-XL 25mg  PRN Labetalol and Hydralazine IV  On metoprolol 25 twice daily BP goal < 160 Long-term BP goal normotensive   Hyperlipidemia Home meds:  Lipitor 80mg , Zetia 10mg  LDL 24, goal < 70 Lipitor to 40 given low LDL Continue statin at discharge   Dysphagia Dysphagia 1 diet with nectar thick liquid Cortrak removed 7/3 Encourage po intake - not adequate yet Family has decided against PEG placement Calorie count 6/27 - 7/1 Intake is improving    Tobacco abuse Current smoker Smoking cessation counseling will be provided   Other Stroke Risk Factors Advanced Age >/=  54  CAD/STEMI status post CABG Family hx stroke (brother)   Other Active Problems ? Medication non-compliance Dementia -restarted home Aricept  RN Pressure Injury Documentation: Pressure Injury 08/03/22 Coccyx Mid Stage 2 -  Partial thickness loss of dermis presenting as a shallow open injury with a red, pink wound bed without slough. 1cm , no drainage (Active)  08/03/22 2030  Location: Coccyx  Location Orientation: Mid  Staging: Stage 2 -  Partial thickness loss of dermis presenting as a shallow open injury with a red, pink wound bed without slough.  Wound Description (Comments): 1cm , no drainage  Present on Admission:   Dressing Type Foam - Lift dressing to assess site every shift 08/06/22 0840     DISCHARGE EXAM Blood pressure (!) 116/56, pulse 63, temperature 98.2 F (36.8 C), temperature source Oral, resp. rate (!) 21, height 5' (1.524 m), weight 35.4 kg, SpO2 100 %. General: Thin, ill-appearing patient in no acute distress Respiratory: Regular, unlabored respirations on room air Neurological: Patient lies in bed with eyes open and does not respond to name but regards examiner and tracks objects throughout the room.  She does not follow commands.  Extraocular movements appear intact, and left facial droop continues.  She will move all 4 extremities spontaneously but not to command.  Discharge Diet       Diet   DIET - DYS 1 Room service appropriate? No; Fluid consistency: Nectar Thick   liquids  DISCHARGE PLAN Disposition: SNF aspirin 81 mg daily for secondary stroke prevention indefinitely. Ongoing stroke risk factor control by Primary Care Physician at time of discharge Follow-up PCP Revelo, Presley Raddle, MD in 2 weeks. Follow-up in [facility] in 8 weeks, office to schedule an appointment.   32 minutes were spent preparing discharge.  Cortney E Ernestina Columbia , MSN, AGACNP-BC [facility] See Amion for schedule and pager  information 08/06/2022 1:45 PM  I have personally obtained history,examined this patient, reviewed notes, independently viewed imaging studies, participated in medical decision making and plan of care.ROS completed by me personally and pertinent positives fully documented  I have made any additions or clarifications directly to the above note. Agree with note above.    Delia Heady, MD Medical Director [facility] Pager: [phone] 08/06/2022 2:18 PM

## 2022-08-04 NOTE — Progress Notes (Signed)
   Medical records reviewed including progress notes, labs, and imaging. Discussed case with primary team and TOC.  Goals of care are for discharge to SNF and avoid PEG placement, with preference for careful hand feeding and rotation of family visitation to provide encouragement at meal times. Family is agreeable to outpatient palliative care follow up.   Family seems to have concerns about disposition timing and location. Patient is currently medically stable for discharge. Will defer to primary team.  PMT will follow peripherally and re-engage as necessary. Patient's family has been encouraged to reach out with additional needs or concerns.  Thank you for your referral and allowing PMT to assist in [name] [name] care.   Richardson Dopp, [facility] Palliative Medicine Team  Team Phone # [phone]   NO CHARGE

## 2022-08-04 NOTE — Progress Notes (Signed)
Nutrition Follow-up  DOCUMENTATION CODES:  Severe malnutrition in context of chronic illness, Underweight  INTERVENTION:  Discontinue TF order set as cortrak was removed this AM Continue current diet as ordered Full assistance with feeding at all meals. Automatic trays for all meals from dining services Mighty Shake TID to provide 330kcal and 9g protein MVI with minerals daily  NUTRITION DIAGNOSIS:   Severe Malnutrition related to chronic illness as evidenced by severe fat depletion, severe muscle depletion. - remains applicable  GOAL:   Patient will meet greater than or equal to 90% of their needs - progressing  MONITOR:   TF tolerance, Labs  REASON FOR ASSESSMENT:   Consult Enteral/tube feeding initiation and management  ASSESSMENT:   Pt with PMH of HTN, HLD, STEMI, CABG, tobacco use, seizures, Afib on Eliquis, and 4 strokes in the past with the most recent last month now admitted with R MCA moderate with L frontal punctate infarcts with R BG hemorrhagic conversion.  6/24 - cortrak placed 6/25 - SLP, DYS1 / nectar 7/3 - cortrak removed  Pt resting in bedside chair at the time of assessment. Kcal count concluded Monday and nocturnal TF rate was decreased. After discussions with PMT, family decided PEG placement was not an intervention they wanted to pursue at this time. Pt medically stable for dc and CM working on placement. Cortrak tube was removed earlier today. Will discontinue all TF related orders.  Pt's PO intake has improved. Will add nutrition supplements to tray to augment intake.   Noted that stage 2 now charted to the coccyx  Average Meal Intake: 6/26: 25% intake x 1 recorded meals 7/1-7/3: 60% intake x 8 recorded meals  Nutritionally Relevant Medications: Scheduled Meds:  atorvastatin  40 mg Daily   multivitamin with minerals  1 tablet Daily   pantoprazole  40 mg QHS   senna-docusate  1 tablet BID   PRN Meds: polyethylene glycol  Labs  Reviewed: CBG ranges from 113-132 mg/dL over the last 24 hours  NUTRITION - FOCUSED PHYSICAL EXAM: Flowsheet Row Most Recent Value  Orbital Region Severe depletion  Upper Arm Region Severe depletion  Thoracic and Lumbar Region Severe depletion  Buccal Region Severe depletion  Temple Region Severe depletion  Clavicle Bone Region Severe depletion  Clavicle and Acromion Bone Region Severe depletion  Scapular Bone Region Severe depletion  Dorsal Hand Severe depletion  Patellar Region Severe depletion  Anterior Thigh Region Severe depletion  Posterior Calf Region Severe depletion  Edema (RD Assessment) None  Hair Reviewed  Eyes Reviewed  Mouth Reviewed  [poor dentition, unable to see tongue]  Skin Reviewed  Nails Reviewed    Diet Order:   Diet Order             DIET - DYS 1 Room service appropriate? No; Fluid consistency: Nectar Thick  Diet effective now                   EDUCATION NEEDS:  Education needs have been addressed  Skin:  Skin Assessment: Reviewed RN Assessment Stage 2 coccyx (1 cm x 0.5 cm)  Last BM:  7/2  Height:  Ht Readings from Last 1 Encounters:  07/28/22 5' (1.524 m)    Weight:  Wt Readings from Last 1 Encounters:  07/30/22 35.4 kg    Ideal Body Weight:  45.5 kg  BMI:  Body mass index is 15.23 kg/m.  Estimated Nutritional Needs:  Kcal:  1400-1600 Protein:  70-85 grams Fluid:  >1.5 L/day  Ranell Patrick, RD, LDN Clinical Dietitian RD pager # available in [place]  After hours/weekend pager # available in [facility]

## 2022-08-04 NOTE — TOC Progression Note (Signed)
Transition of Care [facility]) - Progression Note    [name]: [id number] Date of Birth: 02-17-1950  Transition of Care [facility]) CM/SW Contact  Baldemar Lenis, [place] Phone Number: 08/04/2022, 1:14 PM  Clinical Narrative:   CSW updated by MD that patient's family refused PEG tube, can continue to pursue SNF placement. CSW spoke with sister, [name] [name], to discuss bed offers and [name] [name] chose [facility]. CSW confirmed bed availability with [place] and initiated insurance authorization. Authorization was approved for patient to admit today.   CSW then received a call back from [name] [name] that the family still wanted to consider the feeding tube, that they were not refusing it period but just refusing it right now. They wanted more time. CSW relayed information to MD and palliative care, they discussed with family and will monitor intake through Friday. Plan to DC to SNF on Friday if patient is eating enough.  CSW also updated that family has changed their mind and wants to look at other options than [facility]. CSW spoke with [name] [name] and provided her with other SNF offers. [name] [name] to discuss with her brothers and will update CSW with choice. CSW updated [facility] to put admission on hold. CSW to follow.    Expected Discharge Plan: Skilled Nursing Facility Barriers to Discharge: Continued Medical Work up, SNF Pending bed offer  Expected Discharge Plan and Services     Post Acute Care Choice: Skilled Nursing Facility Living arrangements for the past 2 months: Hotel/Motel                                       Social Determinants of Health (SDOH) Interventions SDOH Screenings   Tobacco Use: High Risk (07/29/2022)    Readmission Risk Interventions     No data to display

## 2022-08-05 LAB — GLUCOSE, CAPILLARY
Glucose-Capillary: 105 mg/dL — ABNORMAL HIGH (ref 70–99)
Glucose-Capillary: 111 mg/dL — ABNORMAL HIGH (ref 70–99)
Glucose-Capillary: 89 mg/dL (ref 70–99)

## 2022-08-05 NOTE — Plan of Care (Signed)
  Problem: Education: Goal: Knowledge of disease or condition will improve Outcome: Progressing Goal: Knowledge of secondary prevention will improve (MUST DOCUMENT ALL) Outcome: Progressing Goal: Knowledge of patient specific risk factors will improve [name] [name] N/A or DELETE if not current risk factor) Outcome: Progressing   Problem: Intracerebral Hemorrhage Tissue Perfusion: Goal: Complications of Intracerebral Hemorrhage will be minimized Outcome: Progressing   Problem: Health Behavior/Discharge Planning: Goal: Goals will be collaboratively established with patient/family Outcome: Progressing   Problem: Self-Care: Goal: Ability to participate in self-care as condition permits will improve Outcome: Progressing Goal: Verbalization of feelings and concerns over difficulty with self-care will improve Outcome: Progressing

## 2022-08-05 NOTE — Progress Notes (Signed)
STROKE TEAM PROGRESS NOTE   INTERVAL HISTORY Patient is seen in her room with no family at the bedside.  She remains hemodynamically stable, and her neurological exam is unchanged.  Plan to monitor intake until Friday and then D/C to SNF. Family is looking at other offers besides [place].  Vital signs stable.  Neurological exam unchanged.   Vitals:   08/04/22 2300 08/05/22 0300 08/05/22 0735 08/05/22 1107  BP: 124/86 109/69 118/65 102/82  Pulse: 82 68 72 66  Resp: 16 (!) 24 20 18   Temp: 98.4 F (36.9 C) 97.8 F (36.6 C) 98.2 F (36.8 C) 98.8 F (37.1 C)  TempSrc: Axillary Axillary Oral Oral  SpO2: 100% 99% 100% 100%  Weight:      Height:       CBC:  No results for input(s): "WBC", "NEUTROABS", "HGB", "HCT", "MCV", "PLT" in the last 168 hours.  Basic Metabolic Panel:  Recent Labs  Lab 07/29/22 1541 07/30/22 0634  NA  --  134*  K  --  4.8  CL  --  100  CO2  --  25  GLUCOSE  --  150*  BUN  --  26*  CREATININE  --  0.75  CALCIUM  --  8.6*  MG 1.7  --    Lipid Panel:  No results for input(s): "CHOL", "TRIG", "HDL", "CHOLHDL", "VLDL", "LDLCALC" in the last 168 hours.  HgbA1c:  No results for input(s): "HGBA1C" in the last 168 hours.   IMAGING past 24 hours No results found.  PHYSICAL EXAM Constitutional: Cachetic, ill-appearing patient in no acute distress Cardiovascular: Normal rate and regular rhythm.  Respiratory: Effort normal and regular on room air  GI: Soft.  No distension. There is no tenderness.  Skin: WDI MSK: Diffuse muscle wasting   Neuro: Patient lying bed, eyes open, does not respond to name but will make eye contact with and track examiner.  She does not follow commands today. Global aphasia No significant gaze preference today, moving eyes bilaterally.   Left facial droop. Tongue protrusion not cooperative.   Will move all 4 extremities spontaneously but not to command. Sensation, coordination and gait not tested.   ASSESSMENT/PLAN Ms.  [name] [name] is a 72 y.o. female with history of HTN, HLD, STEMI, CABG, tobacco use, seizures, 4 strokes with most recent last month, Afib on Eliquis who presented 6/21 AM after being found with rhythmic jerking movements of the bilateral upper extremities.  She was found to have a right MCA stroke with hemorrhagic conversion.  Stroke with hemorrhagic conversion - right MCA moderate and left frontal punctate infarcts with right BG hemorrhagic conversion, likely due to A-fib even on Eliquis CT head Acute 1.1 x 0.9 x 1.2 cm intraparenchymal hemorrhage in the right basal ganglia. Possible trace intraventricular extension of hemorrhage of the right lateral ventricle. Remote left MCA and PCA territory infarct CTA head & neck Similar occlusion of a right proximal M2 MCA branch. Similar occlusion of the left ICA in the neck with poor opacification of small left MCA and ACA vessels. Similar occluded right vertebral artery. Similar severe stenosis of the left vertebral artery origin Repeat CT 6/21: Stable hematoma MRI  Acute right MCA distribution infarct underlies the petechial hemorrhage in the right putamen. Tiny acute infarct in the superior left frontal lobe. 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  Repeat CT 6/27 patchy right MCA infarct and stable petechial hemorrhage in right basal ganglia with 1 mm midline  shift 2D Echo 06/23/22 EF 40-45% LDL 24 HgbA1c 4.9 UDS pending VTE prophylaxis - SCDs Eliquis (apixaban) daily prior to admission, now on aspirin 81 mg daily Therapy recommendations: SNF Disposition:  pending, palliative care on board, continue full code for now  Seizure Presented with unresponsiveness with bilateral upper extremity jerking Received Versed for seizure activity EEG 6/21: suggestive of cortical dysfunction, likely secondary to underlying ICH.  EEG 6/22: suggestive of cortical dysfunction arising from right frontotemporal region, likely  secondary to underlying ICH. No seizures.  LTM EEG discontinued Continue Keppra 500mg  BID  History of stroke 08/2012 had left MCA stroke, put on loop recorder 2015 found to have A-fib on loop recorder, started on Eliquis 03/2017 admitted for difficulty speaking.  CT no acute abnormality.  CTA head and neck right ICA 80% stenosis, left VA origin severe stenosis and right VA occlusion, as well as significant intracranial stenosis.  Carotid Doppler negative.  MRI showed left MCA infarct and old large left MCA and PCA infarcts.  LDL 53, A1c 5.6.  2D echo EF normal range.  Continue on Eliquis given noncompliance PTA.  Has residual mild expressive aphasia 06/2022 again admitted for worsening aphasia.  CT head and neck found new left ICA occlusion.  Right ICA 75% stenosis, unchanged vertebral arteries as above.  MRI showed left MCA small high convexity infarct.  EF 40 to 45%.  Continue on Eliquis.  Ventricular tachycardia On 6/26, patient had a 1 minute episode of V. tach which self terminated Cardiology consulted, appreciate input Put on amiodarione Continue metoprolol Not candidate for ICD, defer ischemic evaluation for now per card  PAF Found in 2015 by loop recorder On Eliquis PTA Currently AC on hold due to hemorrhagic conversion Repeat CT was stable, aspirin 81 mg daily started Resumed home metoprolol  Hypertension Home meds:  Toprol-XL 25mg  PRN Labetalol and Hydralazine IV  On metoprolol 25 twice daily BP goal < 160 Long-term BP goal normotensive  Hyperlipidemia Home meds:  Lipitor 80mg , Zetia 10mg  LDL 24, goal < 70 Lipitor to 40 given low LDL Continue statin at discharge  Dysphagia Dysphagia 1 diet with nectar thick liquid Cortrak removed 7/3 Encourage po intake - not adequate yet Family has decided against PEG placement Calorie count 6/27 - 7/1 Intake is improving   Tobacco abuse Current smoker Smoking cessation counseling will be provided  Other Stroke Risk  Factors Advanced Age >/= 35  CAD/STEMI status post CABG Family hx stroke (brother)  Other Active Problems ? Medication non-compliance Dementia -restarted home Aricept  Hospital day # 13  Patient is medically stable and plan was to discharge her to skilled nursing facility today however family has changed decision and would prefer she go to a different skilled nursing facility.  Patient is eating better but has to be hand fed.  Await discharge to nursing home when family picks a place Delia Heady, MD Medical Director [facility] Pager: [phone] 08/05/2022 2:19 PM   To contact Stroke Continuity provider, please refer to [email or web address]. After hours, contact General Neurology

## 2022-08-06 DIAGNOSIS — R569 Unspecified convulsions: Secondary | ICD-10-CM

## 2022-08-06 DIAGNOSIS — L8990 Pressure ulcer of unspecified site, unspecified stage: Secondary | ICD-10-CM | POA: Insufficient documentation

## 2022-08-06 LAB — GLUCOSE, CAPILLARY
Glucose-Capillary: 106 mg/dL — ABNORMAL HIGH (ref 70–99)
Glucose-Capillary: 113 mg/dL — ABNORMAL HIGH (ref 70–99)
Glucose-Capillary: 94 mg/dL (ref 70–99)

## 2022-08-06 SURGERY — INSERTION, PEG TUBE
Anesthesia: Monitor Anesthesia Care

## 2022-08-06 MED ORDER — AMIODARONE HCL 400 MG PO TABS: 400.0000 mg | ORAL_TABLET | Freq: Every day | ORAL | 0 refills | Status: DC

## 2022-08-06 MED ORDER — ACETAMINOPHEN 325 MG PO TABS: 650.0000 mg | ORAL_TABLET | ORAL | 0 refills | Status: DC | PRN

## 2022-08-06 MED ORDER — ATORVASTATIN CALCIUM 40 MG PO TABS: 40.0000 mg | ORAL_TABLET | Freq: Every day | ORAL | 0 refills | Status: DC

## 2022-08-06 MED ORDER — POLYETHYLENE GLYCOL 3350 17 G PO PACK: 17.0000 g | PACK | Freq: Every day | ORAL | 0 refills | Status: DC | PRN

## 2022-08-06 MED ORDER — ASPIRIN 81 MG PO CHEW: 81.0000 mg | CHEWABLE_TABLET | Freq: Every day | ORAL | 0 refills | Status: DC

## 2022-08-06 MED ORDER — METOPROLOL TARTRATE 25 MG PO TABS: 25.0000 mg | ORAL_TABLET | Freq: Two times a day (BID) | ORAL | 0 refills | Status: DC

## 2022-08-06 MED ORDER — LEVETIRACETAM 500 MG PO TABS: 500.0000 mg | ORAL_TABLET | Freq: Two times a day (BID) | ORAL | 0 refills | Status: DC

## 2022-08-06 MED ORDER — SENNOSIDES-DOCUSATE SODIUM 8.6-50 MG PO TABS: 1.0000 | ORAL_TABLET | Freq: Two times a day (BID) | ORAL | 0 refills | Status: DC

## 2022-08-06 NOTE — Progress Notes (Signed)
Physical Therapy Treatment Patient Details Name: [name] [name] MRN: [id number] DOB: Jun 29, 1950 Today's Date: 08/06/2022   History of Present Illness 72 y.o. female who presents 07/23/22 after being found in the fetal position with rhythmic jerking movements of the bilateral upper extremities. CT acute IPH in right basal ganglia with possible IVH; long-term EEG ordered; NIHSS=24;   PMH significant of: hypertension, hyperlipidemia, STEMI, CABG, tobacco use, seizures, 4 strokes (including L MCA) and atrial fibrillation on Eliquis    PT Comments  Patient resting in bed and alert, agreeable to therapy stating "oh good" when informed therapist here for PT. Pt required Max assist with multimodal cues and manual facilitation to reach with Lt UE for rolling and Max Assist to sit up EOB. Patient required mod-max assist to maintain seated balance at EOB, pt had flexed posture and slight posterior lean. Pt reaching Lt hand out to hold PT's scrub top for support intermittently but unable to follow cues to pick up bright sock and move it. Manual stretch/support provided in sitting to stretch Lt cervical flexors/rotators. Total assist to return to supine and reposition for comfort/posture at EOS. PROM stretching completed for bil UE/LE's in supine. EOS pt's sister arrived and attempting to assist pt with breakfast. Educated on small bites and pt positioned upright. Will continue to progress as able.    Assistance Recommended at Discharge Frequent or constant Supervision/Assistance  If plan is discharge home, recommend the following:  Can travel by private vehicle    A lot of help with bathing/dressing/bathroom;Assistance with feeding;Direct supervision/assist for medications management;Direct supervision/assist for financial management;Assist for transportation;Help with stairs or ramp for entrance   No  Equipment Recommendations  None recommended by PT    Recommendations for Other Services        Precautions / Restrictions Precautions Precautions: Fall Precaution Comments: seizure, concerns for pressure wound due to positioning Restrictions Weight Bearing Restrictions: No     Mobility  Bed Mobility Overal bed mobility: Needs Assistance Bed Mobility: Rolling, Sidelying to Sit, Sit to Supine Rolling: Max assist Sidelying to sit: Max assist, Total assist   Sit to supine: Max assist, Total assist   General bed mobility comments: Max assist with cues and assist to reach Lt UE to bed rail for Rt roll to sit up EOB. PT making some moves with LE's towards EOB but required Max assist to coordinate and sequence. Max assist to raise trunk upright and pt required constant support to maintain seated balance. Max/Total assist to return to supine and reposition with pillows for pressure relief and positionined/posture in bed.    Transfers                        Ambulation/Gait                   Stairs             Wheelchair Mobility     Tilt Bed    Modified Rankin (Stroke Patients Only) Modified Rankin (Stroke Patients Only) Pre-Morbid Rankin Score: Moderate disability Modified Rankin: Severe disability     Balance Overall balance assessment: Needs assistance Sitting-balance support: Feet supported Sitting balance-Leahy Scale: Poor Sitting balance - Comments: required assistance for head alignment and trunk flexion. pt with posterior leaning and flexed posture with Lt torticolis posture for head/neck. Mod-Max assist to maintain seated balance EOB. Attepmts at pt picking up sock with Lt UE and moving from Rt to Lt, pt grasped sock 1x but  unable to initiate task with extra time and multimodal cues. Postural control: Posterior lean                                  Cognition Arousal/Alertness: Awake/alert Behavior During Therapy: Flat affect Overall Cognitive Status: Difficult to assess Area of Impairment: Following commands, Awareness,  Problem solving, Attention                   Current Attention Level: Sustained, Focused           General Comments: appeared fearful of falling, holding onto rail and not wanting to let go.  Stated "what's the point while on EOB"        Exercises General Exercises - Lower Extremity Ankle Circles/Pumps: PROM, 10 reps, Supine, Left Hip ABduction/ADduction: PROM, 10 reps, Supine, Left Hip Flexion/Marching: PROM, 10 reps, Supine, Left Other Exercises Other Exercises: Cervical stretching into R lateral flexion and cervical rotation Other Exercises: Bil UE D1 flex/ext, 10x, pt with reduced tone each repetition. Some active movement with cues on Lt UE.    General Comments        Pertinent Vitals/Pain Pain Assessment Faces Pain Scale: Hurts a little bit Breathing: normal Negative Vocalization: none Facial Expression: smiling or inexpressive Body Language: relaxed Consolability: no need to console PAINAD Score: 0 Pain Location: generalized with movement, BLE knees with extention Pain Descriptors / Indicators: Grimacing Pain Intervention(s): Limited activity within patient's tolerance, Monitored during session, Repositioned    Home Living                          Prior Function            PT Goals (current goals can now be found in the care plan section) Acute Rehab PT Goals Patient Stated Goal: pt unable PT Goal Formulation: Patient unable to participate in goal setting Time For Goal Achievement: 08/07/22 Potential to Achieve Goals: Poor Progress towards PT goals: Progressing toward goals    Frequency    Min 2X/week      PT Plan Current plan remains appropriate    Co-evaluation              AM-PAC PT "6 Clicks" Mobility   Outcome Measure  Help needed turning from your back to your side while in a flat bed without using bedrails?: Total Help needed moving from lying on your back to sitting on the side of a flat bed without using  bedrails?: Total Help needed moving to and from a bed to a chair (including a wheelchair)?: Total Help needed standing up from a chair using your arms (e.g., wheelchair or bedside chair)?: Total Help needed to walk in hospital room?: Total Help needed climbing 3-5 steps with a railing? : Total 6 Click Score: 6    End of Session   Activity Tolerance: Patient tolerated treatment well Patient left: in bed;with call bell/phone within reach;with bed alarm set;with family/visitor present Nurse Communication: Other (comment) (PRAFO's, air mattress bed) PT Visit Diagnosis: Difficulty in walking, not elsewhere classified (R26.2);Other symptoms and signs involving the nervous system (R29.898)     Time: 2956-2130 PT Time Calculation (min) (ACUTE ONLY): 42 min  Charges:    $Therapeutic Activity: 38-52 mins PT General Charges $$ ACUTE PT VISIT: 1 Visit  Wynn Maudlin, DPT Acute Rehabilitation Services Office [phone]  08/06/22 10:44 AM

## 2022-08-06 NOTE — TOC Transition Note (Signed)
Transition of Care [facility]) - CM/SW Discharge Note   [name]: [id number] Date of Birth: September 13, 1950  [facility] Care [facility]) CM/SW Contact:  Baldemar Lenis, LCSW Phone Number: 08/06/2022, 1:18 PM   Clinical Narrative:   CSW updated that insurance authorization has been approved to admit to [facility]. Sister completed paperwork, [facility] is ready for admission. CSW sent discharge summary to [facility]. PTAR contacted for transport for next available.  Nurse to call report to [phone] Room 3206.    Final next level of care: Skilled Nursing Facility Barriers to Discharge: Barriers Resolved   Patient Goals and CMS Choice CMS Medicare.gov Compare Post Acute Care list provided to:: Patient Represenative (must comment) (sister [name] [name]) Choice offered to / list presented to : [facility] POA / Guardian (sister [name] [name])  Discharge Placement                Patient chooses bed at: [facility] Patient to be transferred to facility by: PTAR Name of family member notified: [name] [name] Patient and family notified of of transfer: 08/06/22  Discharge Plan and Services Additional resources added to the After Visit Summary for       Post Acute Care Choice: Skilled Nursing Facility                               Social Determinants of Health (SDOH) Interventions SDOH Screenings   Tobacco Use: High Risk (07/29/2022)     Readmission Risk Interventions     No data to display

## 2022-08-06 NOTE — TOC Progression Note (Signed)
Transition of Care [facility]) - Progression Note    [name]: [id number] Date of Birth: 20-Dec-1950  Transition of Care [facility]) CM/SW Contact  Baldemar Lenis, [place] Phone Number: 08/06/2022, 10:47 AM  Clinical Narrative:   CSW received call from patient's sister that the family has decided on [facility] instead of [facility]. CSW confirmed bed availability with [facility], will need a new insurance authorization. CSW asked PT assigned for an updated note, and note has been entered. CSW asked CMA for insurance authorization. CSW updated Sonny Dandy that family has chosen to go elsewhere. [facility] reaching out to patient's sister about paperwork for hopeful admission to SNF this afternoon. CSW to follow.    Expected Discharge Plan: Skilled Nursing Facility Barriers to Discharge: Continued Medical Work up, English as a second language teacher  Expected Discharge Plan and Services     Post Acute Care Choice: Skilled Nursing Facility Living arrangements for the past 2 months: Hotel/Motel                                       Social Determinants of Health (SDOH) Interventions SDOH Screenings   Tobacco Use: High Risk (07/29/2022)    Readmission Risk Interventions     No data to display

## 2022-08-06 NOTE — Progress Notes (Signed)
Speech Language Pathology Treatment: Dysphagia;Cognitive-Linquistic  Patient Details Name: [name] [name] MRN: [id number] DOB: 1950/08/07 Today's Date: 08/06/2022 Time: 4098-1191 SLP Time Calculation (min) (ACUTE ONLY): 13 min  Assessment / Plan / Recommendation Clinical Impression  Pt assisted with part of breakfast targeting swallow and communication/cognition. She intermittently responded to simple questions with head gestures or one word "yeah" unable to expand her response and sometimes did not respond. She follows simple commands but continues to have difficulty relaying communicative intent.  Mild oral and suspected pharyngeal delays with puree, nectar thick via spoon. There were no signs of aspiration and pt able to clear oral cavity. She appears to be tolerating current diet. Next week may trial thin at bedside or MBS if she would be able to participate due to leg contractures, lean to left and head turn to left.    HPI HPI: 72 y.o. female presented to ED via EMS with AMS, seizure, new CVA. PMHx hypertension, hyperlipidemia, STEMI, CABG, tobacco use, seizures, 4 strokes with most recent last month (bedside swallow eval with recs for dysphagia 2/chopped, thin liquids). MRI: Acute right MCA distribution infarct underlies the petechial  hemorrhage in the right putamen. Tiny acute infarct in the superior left frontal lobe.2. Extensive chronic ischemic injury especially in the left cerebral hemisphere. Currently, patient on long-term EEG.      SLP Plan  Continue with current plan of care      Recommendations for follow up therapy are one component of a multi-disciplinary discharge planning process, led by the attending physician.  Recommendations may be updated based on patient status, additional functional criteria and insurance authorization.    Recommendations  Diet recommendations: Dysphagia 1 (puree);Nectar-thick liquid Liquids provided via: Teaspoon Medication Administration:  Crushed with puree Supervision: Full supervision/cueing for compensatory strategies Compensations: Minimize environmental distractions;Slow rate;Small sips/bites Postural Changes and/or Swallow Maneuvers: Seated upright 90 degrees                  Oral care QID   Frequent or constant Supervision/Assistance Cognitive communication deficit (R41.841);Dysphagia, unspecified (R13.10)     Continue with current plan of care     [name]  08/06/2022, 8:52 AM

## 2022-08-31 ENCOUNTER — Ambulatory Visit: Payer: 59 | Admitting: Internal Medicine

## 2022-09-28 ENCOUNTER — Ambulatory Visit: Payer: 59 | Admitting: Cardiology

## 2022-11-18 ENCOUNTER — Emergency Department (HOSPITAL_COMMUNITY)
Admission: EM | Admit: 2022-11-18 | Discharge: 2022-11-19 | Disposition: A | Discharge status: Skilled Nursing Facility | Payer: 59 | Attending: Emergency Medicine | Admitting: Emergency Medicine

## 2022-11-18 ENCOUNTER — Other Ambulatory Visit: Payer: Self-pay

## 2022-11-18 ENCOUNTER — Encounter (HOSPITAL_COMMUNITY): Payer: Self-pay

## 2022-11-18 ENCOUNTER — Emergency Department (HOSPITAL_COMMUNITY): Payer: 59

## 2022-11-18 DIAGNOSIS — S0083XA Contusion of other part of head, initial encounter: Secondary | ICD-10-CM | POA: Insufficient documentation

## 2022-11-18 DIAGNOSIS — I10 Essential (primary) hypertension: Secondary | ICD-10-CM | POA: Diagnosis not present

## 2022-11-18 DIAGNOSIS — F0390 Unspecified dementia without behavioral disturbance: Secondary | ICD-10-CM | POA: Diagnosis not present

## 2022-11-18 DIAGNOSIS — S0990XA Unspecified injury of head, initial encounter: Secondary | ICD-10-CM | POA: Diagnosis present

## 2022-11-18 DIAGNOSIS — Z79899 Other long term (current) drug therapy: Secondary | ICD-10-CM | POA: Insufficient documentation

## 2022-11-18 DIAGNOSIS — Z7982 Long term (current) use of aspirin: Secondary | ICD-10-CM | POA: Insufficient documentation

## 2022-11-18 DIAGNOSIS — W19XXXA Unspecified fall, initial encounter: Secondary | ICD-10-CM

## 2022-11-18 DIAGNOSIS — W06XXXA Fall from bed, initial encounter: Secondary | ICD-10-CM | POA: Diagnosis not present

## 2022-11-18 DIAGNOSIS — Z8673 Personal history of transient ischemic attack (TIA), and cerebral infarction without residual deficits: Secondary | ICD-10-CM | POA: Diagnosis not present

## 2022-11-18 NOTE — ED Triage Notes (Signed)
Patient BIB [facility] from [facility] for fall from bed that was unwitnessed. Patient presents with hematoma on her forehead, dementia and dysarthria at baseline and is a poor historian. Patient is oriented to self at baseline. VSS, no distress noted. BP 126/51, HR 50, 100% RA, CBG 161, RR 18.

## 2022-11-19 NOTE — ED Provider Notes (Signed)
[place] EMERGENCY DEPARTMENT AT [facility] Provider Note   CSN: [id number] Arrival date & time: 11/18/22  2211     History   CC: Fall  [name] [name] is a 72 y.o. female.  The history is provided by the patient and medical records.   Level 5 caveat: Dementia  72 y.o. F with hx of HTN, prior stroke, HLP, seizure disorder, dementia, presenting to the ED s/p fall.  Apparently had unwitnessed fall from bed at [facility].  Unclear if LOC.  Patient arrives with hematoma to right forehead.  She reports that area hurts.  Denies any other injuries or pain otherwise.  Not on anticoagulation.  Home Medications Prior to Admission medications   Medication Sig Start Date End Date Taking? Authorizing Provider  acetaminophen (TYLENOL) 325 MG tablet Take 2 tablets (650 mg total) by mouth every 4 (four) hours as needed for mild pain (or temp > 37.5 C (99.5 F)). 08/06/22   de Saintclair Halsted, Cortney E, NP  amiodarone (PACERONE) 400 MG tablet Take 1 tablet (400 mg total) by mouth daily. 08/07/22   de Saintclair Halsted, Cortney E, NP  aspirin 81 MG chewable tablet Chew 1 tablet (81 mg total) by mouth daily. 08/07/22   de Saintclair Halsted, Cortney E, NP  atorvastatin (LIPITOR) 40 MG tablet Take 1 tablet (40 mg total) by mouth daily. 08/07/22   de Saintclair Halsted, Cortney E, NP  donepezil (ARICEPT) 5 MG tablet Take 1 tablet (5 mg total) by mouth at bedtime. 03/21/17   Angiulli, Mcarthur Rossetti, PA-C  ezetimibe (ZETIA) 10 MG tablet Take 1 tablet (10 mg total) by mouth daily. 06/25/22 12/22/22  Gillis Santa, MD  levETIRAcetam (KEPPRA) 500 MG tablet Take 1 tablet (500 mg total) by mouth 2 (two) times daily. 08/06/22   de Saintclair Halsted, Cortney E, NP  metoprolol tartrate (LOPRESSOR) 25 MG tablet Take 1 tablet (25 mg total) by mouth 2 (two) times daily. 08/06/22   de Saintclair Halsted, Cortney E, NP  polyethylene glycol (MIRALAX / GLYCOLAX) 17 g packet Take 17 g by mouth daily as needed for mild constipation, moderate constipation or severe constipation. 08/06/22   de  Saintclair Halsted, Cortney E, NP  senna-docusate (SENOKOT-S) 8.6-50 MG tablet Take 1 tablet by mouth 2 (two) times daily. 08/06/22   de Saintclair Halsted, Lennox Solders, NP      Allergies    Patient has no known allergies.    Review of Systems   Review of Systems  Unable to perform ROS: Dementia    Physical Exam Updated Vital Signs BP (!) 124/59 (BP Location: Left Arm)   Pulse (!) 53   Temp 98.4 F (36.9 C) (Oral)   Resp 20   SpO2 98%   Physical Exam Vitals and nursing note reviewed.  Constitutional:      Appearance: She is well-developed.  HENT:     Head: Normocephalic and atraumatic.     Comments: Hematoma right forehead, soft, no open wound or laceration Eyes:     Conjunctiva/sclera: Conjunctivae normal.     Pupils: Pupils are equal, round, and reactive to light.  Cardiovascular:     Rate and Rhythm: Normal rate and regular rhythm.     Heart sounds: Normal heart sounds.  Pulmonary:     Effort: Pulmonary effort is normal.     Breath sounds: Normal breath sounds.  Abdominal:     General: Bowel sounds are normal.     Palpations: Abdomen is soft.  Musculoskeletal:  General: Normal range of motion.     Cervical back: Normal range of motion.     Comments: Bilateral lower extremities are somewhat contractured, contracture boots on both feet, pelvis is nontender, no appreciable leg shortening  Skin:    General: Skin is warm and dry.  Neurological:     Mental Status: She is alert.     Comments: Awake, alert, responds to name being called, somewhat limited movement of BLE due to contractures, normal ROM of BUE     ED Results / Procedures / Treatments   Labs (all labs ordered are listed, but only abnormal results are displayed) Labs Reviewed - No data to display  EKG None  Radiology CT Cervical Spine Wo Contrast  Result Date: 11/19/2022 CLINICAL DATA:  Fall, neck trauma EXAM: CT CERVICAL SPINE WITHOUT CONTRAST TECHNIQUE: Multidetector CT imaging of the cervical spine was  performed without intravenous contrast. Multiplanar CT image reconstructions were also generated. RADIATION DOSE REDUCTION: This exam was performed according to the departmental dose-optimization program which includes automated exposure control, adjustment of the mA and/or kV according to patient size and/or use of iterative reconstruction technique. COMPARISON:  None Available. FINDINGS: Alignment: No subluxation. Skull base and vertebrae: No acute fracture. No primary bone lesion or focal pathologic process. Soft tissues and spinal canal: No prevertebral fluid or swelling. No visible canal hematoma. Disc levels:  Diffuse degenerative disc disease and facet disease. Upper chest: No acute findings Other: None IMPRESSION: No acute bony abnormality. Electronically Signed   By: Charlett Nose M.D.   On: 11/19/2022 00:42   CT Head Wo Contrast  Result Date: 11/19/2022 CLINICAL DATA:  Fall, head trauma EXAM: CT HEAD WITHOUT CONTRAST TECHNIQUE: Contiguous axial images were obtained from the base of the skull through the vertex without intravenous contrast. RADIATION DOSE REDUCTION: This exam was performed according to the departmental dose-optimization program which includes automated exposure control, adjustment of the mA and/or kV according to patient size and/or use of iterative reconstruction technique. COMPARISON:  07/29/2022 FINDINGS: Brain: Chronic encephalomalacia throughout the left cerebral hemisphere with ex vacuo dilatation of the left lateral ventricle. Previously seen right basal ganglia hemorrhage has resolved. There is atrophy and chronic small vessel disease changes. No acute intracranial abnormality. Specifically, no hemorrhage, hydrocephalus, mass lesion, acute infarction, or significant intracranial injury. Vascular: No hyperdense vessel or unexpected calcification. Skull: No acute calvarial abnormality. Sinuses/Orbits: No acute findings Other: None IMPRESSION: Chronic encephalomalacia in the left  cerebral hemisphere. Atrophy, chronic microvascular disease. No acute intracranial abnormality. Electronically Signed   By: Charlett Nose M.D.   On: 11/19/2022 00:40    Procedures Procedures    Medications Ordered in ED Medications - No data to display  ED Course/ Medical Decision Making/ A&P                                 Medical Decision Making  71 year old female presenting to the ED after unwitnessed fall.  History of dementia and apparently had unwitnessed fall from bed.  Unclear if loss of consciousness.  She has a hematoma to her right forehead.  She is awake and alert, responds to name.  She is moving her upper extremities well, limited movement of bilateral lower extremities due to contractures.  She does have contracture boots in place bilateral feet.  She does not have any apparent injuries aside from hematoma on right forehead.  No open wounds or associated lacerations.  CT  head and neck obtained and reviewed, no acute findings.  Vitals are stable.  Patient appears stable for discharge back to facility.  Can follow-up with PCP.  Return here for new concerns.  Final Clinical Impression(s) / ED Diagnoses Final diagnoses:  Fall, initial encounter    Rx / DC Orders ED Discharge Orders     None         Garlon Hatchet, PA-C 11/19/22 0104    Maia Plan, MD 11/24/22 1010

## 2022-11-19 NOTE — Discharge Instructions (Signed)
Head and neck CT negative.  Follow-up with your primary care doctor. Return here for new concerns.

## 2022-11-22 NOTE — Plan of Care (Signed)
[facility] Tonsillectomy/Adenoidectomy, Postoperative PEDS care plan entered in error.

## 2023-02-16 ENCOUNTER — Encounter (HOSPITAL_COMMUNITY): Payer: Self-pay

## 2023-02-16 ENCOUNTER — Emergency Department (HOSPITAL_COMMUNITY)
Admission: EM | Admit: 2023-02-16 | Discharge: 2023-02-16 | Disposition: A | Discharge status: Home / Self Care | Payer: 59 | Attending: Emergency Medicine | Admitting: Emergency Medicine

## 2023-02-16 ENCOUNTER — Other Ambulatory Visit: Payer: Self-pay

## 2023-02-16 DIAGNOSIS — F0390 Unspecified dementia without behavioral disturbance: Secondary | ICD-10-CM | POA: Diagnosis not present

## 2023-02-16 DIAGNOSIS — Z7982 Long term (current) use of aspirin: Secondary | ICD-10-CM | POA: Diagnosis not present

## 2023-02-16 DIAGNOSIS — Z4800 Encounter for change or removal of nonsurgical wound dressing: Secondary | ICD-10-CM | POA: Insufficient documentation

## 2023-02-16 DIAGNOSIS — Z79899 Other long term (current) drug therapy: Secondary | ICD-10-CM | POA: Diagnosis not present

## 2023-02-16 DIAGNOSIS — I10 Essential (primary) hypertension: Secondary | ICD-10-CM | POA: Diagnosis not present

## 2023-02-16 DIAGNOSIS — Z5189 Encounter for other specified aftercare: Secondary | ICD-10-CM

## 2023-02-16 NOTE — ED Notes (Signed)
 [facility] called for transport back to [facility]

## 2023-02-16 NOTE — ED Provider Notes (Signed)
[place] EMERGENCY DEPARTMENT AT [facility] Provider Note   CSN: [id number] Arrival date & time: 02/16/23  1826     History  Chief Complaint  Patient presents with   Wound Check    [name] [name] is a 73 y.o. female with a past medical history of dementia, HTN, prior CVA, seizure disorder who presents to the ED from [facility] for concerns of worsening sacral wound. She is currently bedbound.  She is unable to contribute to history given her dementia and possibly deficits from her prior stroke.  She says that she is not in any pain currently.    Home Medications Prior to Admission medications   Medication Sig Start Date End Date Taking? Authorizing Provider  acetaminophen (TYLENOL) 325 MG tablet Take 2 tablets (650 mg total) by mouth every 4 (four) hours as needed for mild pain (or temp > 37.5 C (99.5 F)). 08/06/22   de Saintclair Halsted, Cortney E, NP  amiodarone (PACERONE) 400 MG tablet Take 1 tablet (400 mg total) by mouth daily. 08/07/22   de Saintclair Halsted, Cortney E, NP  aspirin 81 MG chewable tablet Chew 1 tablet (81 mg total) by mouth daily. 08/07/22   de Saintclair Halsted, Cortney E, NP  atorvastatin (LIPITOR) 40 MG tablet Take 1 tablet (40 mg total) by mouth daily. 08/07/22   de Saintclair Halsted, Cortney E, NP  donepezil (ARICEPT) 5 MG tablet Take 1 tablet (5 mg total) by mouth at bedtime. 03/21/17   Angiulli, Mcarthur Rossetti, PA-C  ezetimibe (ZETIA) 10 MG tablet Take 1 tablet (10 mg total) by mouth daily. 06/25/22 12/22/22  Gillis Santa, MD  levETIRAcetam (KEPPRA) 500 MG tablet Take 1 tablet (500 mg total) by mouth 2 (two) times daily. 08/06/22   de Saintclair Halsted, Cortney E, NP  metoprolol tartrate (LOPRESSOR) 25 MG tablet Take 1 tablet (25 mg total) by mouth 2 (two) times daily. 08/06/22   de Saintclair Halsted, Cortney E, NP  polyethylene glycol (MIRALAX / GLYCOLAX) 17 g packet Take 17 g by mouth daily as needed for mild constipation, moderate constipation or severe constipation. 08/06/22   de Saintclair Halsted, Cortney E, NP   senna-docusate (SENOKOT-S) 8.6-50 MG tablet Take 1 tablet by mouth 2 (two) times daily. 08/06/22   de Saintclair Halsted, Lennox Solders, NP      Allergies    Patient has no known allergies.    Review of Systems   Review of Systems  Unable to perform ROS: Dementia  Denies pain.  Physical Exam Updated Vital Signs BP (!) 152/89 (BP Location: Right Arm)   Pulse (!) 52   Temp (!) 97.5 F (36.4 C) (Oral)   Resp 18   Ht 5' (1.524 m)   Wt 36 kg   SpO2 100%   BMI 15.50 kg/m  Physical Exam Constitutional:      Comments: Chronically ill appearing, sarcopenic, bedbound  Cardiovascular:     Rate and Rhythm: Normal rate and regular rhythm.     Heart sounds: Normal heart sounds.  Pulmonary:     Effort: Pulmonary effort is normal.     Breath sounds: Normal breath sounds.  Abdominal:     General: Abdomen is flat.     Palpations: Abdomen is soft.  Musculoskeletal:     Comments: Contractures due to being chronically bedbound  Skin:    Findings: Lesion present.     Comments: Stage III sacral wound, extending into subcutaneous tissue. Serosanguinous discharge on the bandage. No purulence, surrounding erythema.  Neurological:     Mental Status: She is alert. She is disoriented.     Comments: dementia     ED Results / Procedures / Treatments   Labs (all labs ordered are listed, but only abnormal results are displayed) Labs Reviewed - No data to display  EKG None  Radiology No results found.  Medications Ordered in ED Medications - No data to display  ED Course/ Medical Decision Making/ A&P Clinical Course as of 02/16/23 1920  Wed Feb 16, 2023  1915 Stable Wound care, acute on chronic. [CC]  1916 Will refer patient to wound care.  No evidence of surrounding infection or any other focal pathology.  Patient stable for outpatient care management follow-up with wound care center with referral.  Family updated. [CC]    Clinical Course User Index [CC] [name] Ade, MD   {  Medical  Decision Making  [name] is a 73 year old presenting from [facility] because staff was concerned about a worsening sacral wound. Her vital signs are stable and she says she is not in any pain. She has some serosanguinous drainage from the wound but I do not appreciate any purulence or surrounding erythema on exam. She is afebrile, hemodynamically stable, no signs of systemic infection.   I spoke with her sister, [name] [name], about the patient. She states that she was being treated for a wound infection by the facility and they were concerned about possible worsening of the wounds, so they sent her to the ED for evaluation. She was otherwise around her baseline.   Given my exam, I do not see evidence of an acute infection or any emergent worsening of her condition. We will place a wound care consult and have her follow up outpatient for her sacral wound.   Final Clinical Impression(s) / ED Diagnoses Final diagnoses:  Visit for wound check   Rx / DC Orders ED Discharge Orders          Ordered    AMB referral to wound care center        02/16/23 1916              Annett Fabian, MD 02/16/23 Vivianne Spence, MD 02/17/23 [phone]

## 2023-02-16 NOTE — Discharge Instructions (Addendum)
 [name] [name] was sent in today for a wound on her sacrum. No evidence of acute infection or anything more emergent.  We have referred the patient to outpatient wound care.  Please call to make an appointment for outpatient follow-up.

## 2023-02-16 NOTE — ED Triage Notes (Addendum)
 Patient BIB [facility] from [facility]. Staff said sacral wounds are getting worse. Bed bound and contracted. Has dementia.

## 2023-02-24 ENCOUNTER — Other Ambulatory Visit: Payer: Self-pay

## 2023-02-24 ENCOUNTER — Emergency Department (HOSPITAL_COMMUNITY)
Admission: EM | Admit: 2023-02-24 | Discharge: 2023-02-24 | Disposition: A | Discharge status: Home / Self Care | Payer: 59 | Attending: Emergency Medicine | Admitting: Emergency Medicine

## 2023-02-24 ENCOUNTER — Encounter (HOSPITAL_COMMUNITY): Payer: Self-pay

## 2023-02-24 ENCOUNTER — Emergency Department (HOSPITAL_COMMUNITY): Payer: 59

## 2023-02-24 DIAGNOSIS — L89153 Pressure ulcer of sacral region, stage 3: Secondary | ICD-10-CM | POA: Diagnosis not present

## 2023-02-24 DIAGNOSIS — J439 Emphysema, unspecified: Secondary | ICD-10-CM | POA: Insufficient documentation

## 2023-02-24 DIAGNOSIS — I700 Atherosclerosis of aorta: Secondary | ICD-10-CM | POA: Insufficient documentation

## 2023-02-24 DIAGNOSIS — M6289 Other specified disorders of muscle: Secondary | ICD-10-CM | POA: Insufficient documentation

## 2023-02-24 DIAGNOSIS — Z951 Presence of aortocoronary bypass graft: Secondary | ICD-10-CM | POA: Diagnosis not present

## 2023-02-24 DIAGNOSIS — K5900 Constipation, unspecified: Secondary | ICD-10-CM | POA: Diagnosis not present

## 2023-02-24 DIAGNOSIS — L98423 Non-pressure chronic ulcer of back with necrosis of muscle: Secondary | ICD-10-CM

## 2023-02-24 DIAGNOSIS — L89159 Pressure ulcer of sacral region, unspecified stage: Secondary | ICD-10-CM | POA: Insufficient documentation

## 2023-02-24 DIAGNOSIS — F0390 Unspecified dementia without behavioral disturbance: Secondary | ICD-10-CM | POA: Insufficient documentation

## 2023-02-24 DIAGNOSIS — Z7982 Long term (current) use of aspirin: Secondary | ICD-10-CM | POA: Diagnosis not present

## 2023-02-24 LAB — I-STAT CHEM 8, ED
BUN: 14 mg/dL (ref 8–23)
Calcium, Ion: 1.13 mmol/L — ABNORMAL LOW (ref 1.15–1.40)
Chloride: 102 mmol/L (ref 98–111)
Creatinine, Ser: 1 mg/dL (ref 0.44–1.00)
Glucose, Bld: 77 mg/dL (ref 70–99)
HCT: 38 % (ref 36.0–46.0)
Hemoglobin: 12.9 g/dL (ref 12.0–15.0)
Potassium: 4.6 mmol/L (ref 3.5–5.1)
Sodium: 135 mmol/L (ref 135–145)
TCO2: 25 mmol/L (ref 22–32)

## 2023-02-24 LAB — CBC WITH DIFFERENTIAL/PLATELET
Abs Immature Granulocytes: 0.09 10*3/uL — ABNORMAL HIGH (ref 0.00–0.07)
Basophils Absolute: 0.1 10*3/uL (ref 0.0–0.1)
Basophils Relative: 1 %
Eosinophils Absolute: 0.1 10*3/uL (ref 0.0–0.5)
Eosinophils Relative: 1 %
HCT: 36.5 % (ref 36.0–46.0)
Hemoglobin: 11.8 g/dL — ABNORMAL LOW (ref 12.0–15.0)
Immature Granulocytes: 1 %
Lymphocytes Relative: 12 %
Lymphs Abs: 0.9 10*3/uL (ref 0.7–4.0)
MCH: 29.6 pg (ref 26.0–34.0)
MCHC: 32.3 g/dL (ref 30.0–36.0)
MCV: 91.7 fL (ref 80.0–100.0)
Monocytes Absolute: 0.8 10*3/uL (ref 0.1–1.0)
Monocytes Relative: 11 %
Neutro Abs: 5.5 10*3/uL (ref 1.7–7.7)
Neutrophils Relative %: 74 %
Platelets: 328 10*3/uL (ref 150–400)
RBC: 3.98 MIL/uL (ref 3.87–5.11)
RDW: 16.1 % — ABNORMAL HIGH (ref 11.5–15.5)
WBC: 7.5 10*3/uL (ref 4.0–10.5)
nRBC: 0 % (ref 0.0–0.2)

## 2023-02-24 LAB — BASIC METABOLIC PANEL
Anion gap: 7 (ref 5–15)
BUN: 14 mg/dL (ref 8–23)
CO2: 25 mmol/L (ref 22–32)
Calcium: 8.6 mg/dL — ABNORMAL LOW (ref 8.9–10.3)
Chloride: 101 mmol/L (ref 98–111)
Creatinine, Ser: 1.01 mg/dL — ABNORMAL HIGH (ref 0.44–1.00)
GFR, Estimated: 58 mL/min — ABNORMAL LOW (ref >60)
Glucose, Bld: 81 mg/dL (ref 70–99)
Potassium: 4.5 mmol/L (ref 3.5–5.1)
Sodium: 133 mmol/L — ABNORMAL LOW (ref 135–145)

## 2023-02-24 LAB — SEDIMENTATION RATE: Sed Rate: 34 mm/h — ABNORMAL HIGH (ref 0–22)

## 2023-02-24 LAB — C-REACTIVE PROTEIN: CRP: 3.7 mg/dL — ABNORMAL HIGH (ref <1.0)

## 2023-02-24 MED ORDER — IOHEXOL 350 MG/ML SOLN
45.0000 mL | Freq: Once | INTRAVENOUS | Status: AC | PRN
  Administered 2023-02-24: 45 mL via INTRAVENOUS

## 2023-02-24 NOTE — ED Triage Notes (Signed)
Pt sent from [facility] with ems for worsening sacral wound. Pt bed bound, hx of dementia. Pt at baseline per staff

## 2023-02-24 NOTE — ED Provider Notes (Signed)
[name] [name] Provider Note   CSN: [id number] Arrival date & time: 02/24/23  1742     History  Chief Complaint  Patient presents with   Wound Infection    [name] [name] is a 73 y.o. female.  73 year old female with history of CABG, right MCA stroke with hemorrhagic conversion, seizures, bedbound, and history of dementia who presents to the emergency department due to concerns for sacral wound.  At this time I am unable to reach the patient's facility [facility]) or her sister (emergency contact) for additional information.  It appears from the EMS report that the facility is concerned about sacral wound.  Does have a history of dementia and is at her mental baseline.  Patient states that she is not in significant pain at this time.       Home Medications Prior to Admission medications   Medication Sig Start Date End Date Taking? Authorizing Provider  acetaminophen (TYLENOL) 325 MG tablet Take 2 tablets (650 mg total) by mouth every 4 (four) hours as needed for mild pain (or temp > 37.5 C (99.5 F)). 08/06/22   de Saintclair Halsted, Cortney E, NP  amiodarone (PACERONE) 400 MG tablet Take 1 tablet (400 mg total) by mouth daily. 08/07/22   de Saintclair Halsted, Cortney E, NP  aspirin 81 MG chewable tablet Chew 1 tablet (81 mg total) by mouth daily. 08/07/22   de Saintclair Halsted, Cortney E, NP  atorvastatin (LIPITOR) 40 MG tablet Take 1 tablet (40 mg total) by mouth daily. 08/07/22   de Saintclair Halsted, Cortney E, NP  donepezil (ARICEPT) 5 MG tablet Take 1 tablet (5 mg total) by mouth at bedtime. 03/21/17   Angiulli, Mcarthur Rossetti, PA-C  ezetimibe (ZETIA) 10 MG tablet Take 1 tablet (10 mg total) by mouth daily. 06/25/22 12/22/22  Gillis Santa, MD  levETIRAcetam (KEPPRA) 500 MG tablet Take 1 tablet (500 mg total) by mouth 2 (two) times daily. 08/06/22   de Saintclair Halsted, Cortney E, NP  metoprolol tartrate (LOPRESSOR) 25 MG tablet Take 1 tablet (25 mg total) by mouth 2 (two) times daily. 08/06/22    de Saintclair Halsted, Cortney E, NP  polyethylene glycol (MIRALAX / GLYCOLAX) 17 g packet Take 17 g by mouth daily as needed for mild constipation, moderate constipation or severe constipation. 08/06/22   de Saintclair Halsted, Cortney E, NP  senna-docusate (SENOKOT-S) 8.6-50 MG tablet Take 1 tablet by mouth 2 (two) times daily. 08/06/22   de Saintclair Halsted, Lennox Solders, NP      Allergies    Patient has no known allergies.    Review of Systems   Review of Systems  Physical Exam Updated Vital Signs BP (!) 154/94   Pulse (!) 57   Temp 97.9 F (36.6 C) (Axillary)   Resp (!) 21   SpO2 98%  Physical Exam Vitals and nursing note reviewed.  Constitutional:      General: She is not in acute distress.    Appearance: She is well-developed.     Comments: Oriented to self only  HENT:     Head: Normocephalic and atraumatic.     Right Ear: External ear normal.     Left Ear: External ear normal.     Nose: Nose normal.  Eyes:     Extraocular Movements: Extraocular movements intact.     Conjunctiva/sclera: Conjunctivae normal.     Pupils: Pupils are equal, round, and reactive to light.  Pulmonary:     Effort:  Pulmonary effort is normal. No respiratory distress.  Abdominal:     General: Abdomen is flat.  Musculoskeletal:     Cervical back: Normal range of motion and neck supple.     Right lower leg: No edema.     Left lower leg: No edema.  Skin:    General: Skin is warm and dry.     Comments: See below for sacral wound.  No crepitance or subcu emphysema.  No perineal erythema.  Neurological:     Mental Status: She is alert. Mental status is at baseline.     Comments: Contractures of all 4 extremities  Psychiatric:        Mood and Affect: Mood normal.     Sacral ulcer:   ED Results / Procedures / Treatments   Labs (all labs ordered are listed, but only abnormal results are displayed) Labs Reviewed  CBC WITH DIFFERENTIAL/PLATELET - Abnormal; Notable for the following components:      Result Value    Hemoglobin 11.8 (*)    RDW 16.1 (*)    Abs Immature Granulocytes 0.09 (*)    All other components within normal limits  BASIC METABOLIC PANEL - Abnormal; Notable for the following components:   Sodium 133 (*)    Creatinine, Ser 1.01 (*)    Calcium 8.6 (*)    GFR, Estimated 58 (*)    All other components within normal limits  SEDIMENTATION RATE - Abnormal; Notable for the following components:   Sed Rate 34 (*)    All other components within normal limits  C-REACTIVE PROTEIN - Abnormal; Notable for the following components:   CRP 3.7 (*)    All other components within normal limits  I-STAT CHEM 8, ED - Abnormal; Notable for the following components:   Calcium, Ion 1.13 (*)    All other components within normal limits    EKG None  Radiology CT ABDOMEN PELVIS W CONTRAST Result Date: 02/24/2023 CLINICAL DATA:  Sacral decubitus wound EXAM: CT ABDOMEN AND PELVIS WITH CONTRAST TECHNIQUE: Multidetector CT imaging of the abdomen and pelvis was performed using the standard protocol following bolus administration of intravenous contrast. RADIATION DOSE REDUCTION: This exam was performed according to the departmental dose-optimization program which includes automated exposure control, adjustment of the mA and/or kV according to patient size and/or use of iterative reconstruction technique. CONTRAST:  45mL OMNIPAQUE IOHEXOL 350 MG/ML SOLN COMPARISON:  None Available. FINDINGS: Lower chest: Emphysema. Left basilar dependent atelectasis trace left pleural effusion Hepatobiliary: Tubular calcified structure within the right upper quadrant likely represents a stone filled markedly contracted gallbladder. No superimposed pericholecystic inflammatory changes. Scattered cysts are seen within the liver. No enhancing intrahepatic mass. No intra or extrahepatic biliary ductal dilation. Pancreas: Unremarkable Spleen: Unremarkable Adrenals/Urinary Tract: The adrenal glands are unremarkable. The kidneys are normal  in size and position. Mild bilateral cortical scarring is present, however, cortical thickness is largely preserved there is slightly delayed right renal cortical enhancement in relation to the left kidney, possibly the sequela of impaired perfusion due to renal artery stenosis. No hydronephrosis. No intrarenal or ureteral calculi. Simple cortical cyst within the interpolar region of the right kidney for which no follow-up imaging is recommended. The bladder is unremarkable. Stomach/Bowel: Large stool burden. The stomach, small bowel, large bowel are otherwise unremarkable. No free intraperitoneal gas or fluid. Vascular/Lymphatic: There is extensive aortoiliac atherosclerotic calcification. Particularly prominent atherosclerotic calcification at the proximal and mid superior mesenteric artery and renal artery origins bilaterally most certainly results in hemodynamically  significant stenosis though this is not optimally characterized on this exam. No aneurysm. No pathologic adenopathy within the abdomen and pelvis. Reproductive: Status post hysterectomy. No adnexal masses. Other: There is pelvic floor relaxation with mild middle and posterior compartment prolapse. Musculoskeletal: Sacral decubitus wound is seen posterior to the distal sacrum and coccyx with a superficial defect measuring roughly 3.8 x 4.8 cm and with the ulcer crater extending to involve the dorsal cortex of the terminal sacrum and coccyx. No superimposed osseous erosion at this time. No discrete drainable fluid collection. Small amount of debris is seen within the ulcer crate which tracks r superiorly subjacent to the margin of the ulcer no acute bone abnormality. Advanced degenerative changes are seen within the lumbar spine. IMPRESSION: 1. Sacral decubitus wound posterior to the distal sacrum and coccyx with a superficial defect measuring roughly 3.8 x 4.8 cm and with the ulcer crater extending to involve the dorsal cortex of the terminal sacrum  and coccyx. No superimposed osseous erosion at this time. No discrete drainable fluid collection. 2. Extensive aortoiliac atherosclerotic calcification. Particularly prominent atherosclerotic calcification at the proximal and mid superior mesenteric artery and renal artery origins bilaterally most certainly results in hemodynamically significant stenosis though this is not optimally characterized on this exam. This could be better assessed with dedicated CTA examination or formal arteriography. 3. Large stool burden. 4. Pelvic floor relaxation with mild middle and posterior compartment prolapse. Aortic Atherosclerosis (ICD10-I70.0) and Emphysema (ICD10-J43.9). Electronically Signed   By: Helyn Numbers M.D.   On: 02/24/2023 21:36    Procedures Procedures    Medications Ordered in ED Medications  iohexol (OMNIPAQUE) 350 MG/ML injection 45 mL (45 mLs Intravenous Contrast Given 02/24/23 2121)    ED Course/ Medical Decision Making/ A&P Clinical Course as of 02/25/23 1106  Thu Feb 24, 2023  1813 Attempted to contact [place] x 2 without response. [RP]  1814 Attempted to contact sister [name] without response [RP]    Clinical Course User Index [RP] Rondel Baton, MD                                 Medical Decision Making Amount and/or Complexity of Data Reviewed Labs: ordered. Radiology: ordered.  Risk Prescription drug management.   [name] [name] is a 73 y.o. female with comorbidities that complicate the patient evaluation including CABG, right MCA stroke with hemorrhagic conversion, seizures, bedbound, and history of dementia who presents to the emergency department due to concerns for sacral wound.    Initial Ddx:  Chronic sacral wound, cellulitis, abscess, osteomyelitis  MDM/Course:  Patient presents to the emergency department with a sacral wound.  On exam no gross signs of infection around the wound such as erythema.  No significant amount of drainage at all.   Patient is nonseptic appearing and is afebrile with normal heart rate.  She also had a normal white blood cell count.  Her inflammatory markers (ESR/CRP) are mildly elevated.  Unfortunately we have limited available history at this time so did obtain a CT to evaluate for any deeper space infection which was negative. Did not show obvious signs of osteomyelitis either. Upon re-evaluation patient was stable.  Feel that she should follow-up with her wound care clinic for continued management of her wounds.  This patient presents to the ED for concern of complaints listed in HPI, this involves an extensive number of treatment options, and is a complaint that carries  with it a high risk of complications and morbidity. Disposition including potential need for admission considered.   Dispo: DC to Facility  Additional history obtained from EMS Records reviewed Outpatient Clinic Notes The following labs were independently interpreted: Chemistry and show no acute abnormality I independently reviewed the following imaging with scope of interpretation limited to determining acute life threatening conditions related to emergency care: CT Abdomen/Pelvis and agree with the radiologist interpretation with the following exceptions: none I personally reviewed and interpreted cardiac monitoring: normal sinus rhythm  I personally reviewed and interpreted the pt's EKG: see above for interpretation  I have reviewed the patients home medications and made adjustments as needed Social Determinants of health:  SNF resident  Portions of this note were generated with Scientist, clinical (histocompatibility and immunogenetics). Dictation errors may occur despite best attempts at proofreading.     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

## 2023-02-24 NOTE — Discharge Instructions (Signed)
You were seen for your sacral ulcer in the emergency department.   At home, please follow-up with the wound care team.  It does not appear that you need antibiotics at this time.    Check your MyChart online for the results of any tests that had not resulted by the time you left the emergency department.   Follow-up with your primary doctor in 2-3 days regarding your visit.    Return immediately to the emergency department if you experience any of the following: Fevers, worsening pain, drainage from the wound, or any other concerning symptoms.    Thank you for visiting our Emergency Department. It was a pleasure taking care of you today.

## 2023-02-24 NOTE — ED Notes (Signed)
New bandage applied to pressure wound sacrum

## 2023-02-24 NOTE — ED Notes (Signed)
Ptar called 

## 2023-03-22 ENCOUNTER — Other Ambulatory Visit (HOSPITAL_BASED_OUTPATIENT_CLINIC_OR_DEPARTMENT_OTHER): Payer: Self-pay

## 2023-04-13 ENCOUNTER — Other Ambulatory Visit: Payer: Self-pay

## 2023-04-13 DIAGNOSIS — I739 Peripheral vascular disease, unspecified: Secondary | ICD-10-CM

## 2023-04-20 ENCOUNTER — Encounter: Payer: 59 | Admitting: Vascular Surgery

## 2023-04-20 ENCOUNTER — Ambulatory Visit (HOSPITAL_COMMUNITY): Admission: RE | Admit: 2023-04-20 | Payer: 59 | Source: Ambulatory Visit

## 2023-04-24 ENCOUNTER — Other Ambulatory Visit: Payer: Self-pay

## 2023-04-24 ENCOUNTER — Emergency Department (HOSPITAL_COMMUNITY)

## 2023-04-24 ENCOUNTER — Inpatient Hospital Stay (HOSPITAL_COMMUNITY)
Admission: EM | Admit: 2023-04-24 | Discharge: 2023-05-03 | DRG: 871 | Disposition: E | Source: Skilled Nursing Facility | Attending: Critical Care Medicine | Admitting: Critical Care Medicine

## 2023-04-24 ENCOUNTER — Inpatient Hospital Stay (HOSPITAL_COMMUNITY)

## 2023-04-24 ENCOUNTER — Encounter (HOSPITAL_COMMUNITY): Payer: Self-pay

## 2023-04-24 DIAGNOSIS — Z951 Presence of aortocoronary bypass graft: Secondary | ICD-10-CM

## 2023-04-24 DIAGNOSIS — I10 Essential (primary) hypertension: Secondary | ICD-10-CM | POA: Diagnosis present

## 2023-04-24 DIAGNOSIS — L89894 Pressure ulcer of other site, stage 4: Secondary | ICD-10-CM | POA: Diagnosis present

## 2023-04-24 DIAGNOSIS — G9340 Encephalopathy, unspecified: Secondary | ICD-10-CM

## 2023-04-24 DIAGNOSIS — I2510 Atherosclerotic heart disease of native coronary artery without angina pectoris: Secondary | ICD-10-CM | POA: Diagnosis present

## 2023-04-24 DIAGNOSIS — Z7189 Other specified counseling: Secondary | ICD-10-CM | POA: Diagnosis not present

## 2023-04-24 DIAGNOSIS — R6521 Severe sepsis with septic shock: Principal | ICD-10-CM | POA: Diagnosis present

## 2023-04-24 DIAGNOSIS — M419 Scoliosis, unspecified: Secondary | ICD-10-CM | POA: Diagnosis present

## 2023-04-24 DIAGNOSIS — L89520 Pressure ulcer of left ankle, unstageable: Secondary | ICD-10-CM | POA: Diagnosis present

## 2023-04-24 DIAGNOSIS — E43 Unspecified severe protein-calorie malnutrition: Secondary | ICD-10-CM | POA: Diagnosis present

## 2023-04-24 DIAGNOSIS — Z515 Encounter for palliative care: Secondary | ICD-10-CM | POA: Diagnosis not present

## 2023-04-24 DIAGNOSIS — N179 Acute kidney failure, unspecified: Secondary | ICD-10-CM

## 2023-04-24 DIAGNOSIS — I21A1 Myocardial infarction type 2: Secondary | ICD-10-CM | POA: Diagnosis present

## 2023-04-24 DIAGNOSIS — R64 Cachexia: Secondary | ICD-10-CM | POA: Diagnosis present

## 2023-04-24 DIAGNOSIS — J690 Pneumonitis due to inhalation of food and vomit: Secondary | ICD-10-CM | POA: Diagnosis present

## 2023-04-24 DIAGNOSIS — A419 Sepsis, unspecified organism: Secondary | ICD-10-CM | POA: Diagnosis present

## 2023-04-24 DIAGNOSIS — Z1152 Encounter for screening for COVID-19: Secondary | ICD-10-CM

## 2023-04-24 DIAGNOSIS — D649 Anemia, unspecified: Secondary | ICD-10-CM | POA: Diagnosis present

## 2023-04-24 DIAGNOSIS — R627 Adult failure to thrive: Secondary | ICD-10-CM | POA: Diagnosis present

## 2023-04-24 DIAGNOSIS — I214 Non-ST elevation (NSTEMI) myocardial infarction: Secondary | ICD-10-CM

## 2023-04-24 DIAGNOSIS — L89510 Pressure ulcer of right ankle, unstageable: Secondary | ICD-10-CM | POA: Diagnosis present

## 2023-04-24 DIAGNOSIS — F17210 Nicotine dependence, cigarettes, uncomplicated: Secondary | ICD-10-CM | POA: Diagnosis present

## 2023-04-24 DIAGNOSIS — I252 Old myocardial infarction: Secondary | ICD-10-CM

## 2023-04-24 DIAGNOSIS — Z681 Body mass index (BMI) 19 or less, adult: Secondary | ICD-10-CM | POA: Diagnosis not present

## 2023-04-24 DIAGNOSIS — Z7982 Long term (current) use of aspirin: Secondary | ICD-10-CM

## 2023-04-24 DIAGNOSIS — J189 Pneumonia, unspecified organism: Secondary | ICD-10-CM | POA: Diagnosis present

## 2023-04-24 DIAGNOSIS — J9601 Acute respiratory failure with hypoxia: Secondary | ICD-10-CM

## 2023-04-24 DIAGNOSIS — R1310 Dysphagia, unspecified: Secondary | ICD-10-CM

## 2023-04-24 DIAGNOSIS — L89892 Pressure ulcer of other site, stage 2: Secondary | ICD-10-CM | POA: Diagnosis present

## 2023-04-24 DIAGNOSIS — Z823 Family history of stroke: Secondary | ICD-10-CM

## 2023-04-24 DIAGNOSIS — E162 Hypoglycemia, unspecified: Secondary | ICD-10-CM | POA: Diagnosis present

## 2023-04-24 DIAGNOSIS — B961 Klebsiella pneumoniae [K. pneumoniae] as the cause of diseases classified elsewhere: Secondary | ICD-10-CM | POA: Diagnosis not present

## 2023-04-24 DIAGNOSIS — F0390 Unspecified dementia without behavioral disturbance: Secondary | ICD-10-CM | POA: Diagnosis present

## 2023-04-24 DIAGNOSIS — J150 Pneumonia due to Klebsiella pneumoniae: Secondary | ICD-10-CM | POA: Diagnosis not present

## 2023-04-24 DIAGNOSIS — A4159 Other Gram-negative sepsis: Secondary | ICD-10-CM | POA: Diagnosis present

## 2023-04-24 DIAGNOSIS — E876 Hypokalemia: Secondary | ICD-10-CM | POA: Diagnosis present

## 2023-04-24 DIAGNOSIS — Z66 Do not resuscitate: Secondary | ICD-10-CM | POA: Diagnosis present

## 2023-04-24 DIAGNOSIS — L89154 Pressure ulcer of sacral region, stage 4: Secondary | ICD-10-CM | POA: Diagnosis present

## 2023-04-24 DIAGNOSIS — E872 Acidosis, unspecified: Secondary | ICD-10-CM | POA: Diagnosis present

## 2023-04-24 DIAGNOSIS — K8020 Calculus of gallbladder without cholecystitis without obstruction: Secondary | ICD-10-CM | POA: Diagnosis present

## 2023-04-24 DIAGNOSIS — Z7401 Bed confinement status: Secondary | ICD-10-CM

## 2023-04-24 DIAGNOSIS — I69398 Other sequelae of cerebral infarction: Secondary | ICD-10-CM

## 2023-04-24 DIAGNOSIS — J9811 Atelectasis: Secondary | ICD-10-CM | POA: Diagnosis present

## 2023-04-24 DIAGNOSIS — Z8249 Family history of ischemic heart disease and other diseases of the circulatory system: Secondary | ICD-10-CM

## 2023-04-24 DIAGNOSIS — L89896 Pressure-induced deep tissue damage of other site: Secondary | ICD-10-CM | POA: Diagnosis present

## 2023-04-24 DIAGNOSIS — I4891 Unspecified atrial fibrillation: Secondary | ICD-10-CM | POA: Diagnosis present

## 2023-04-24 DIAGNOSIS — G9341 Metabolic encephalopathy: Secondary | ICD-10-CM | POA: Diagnosis present

## 2023-04-24 DIAGNOSIS — R7401 Elevation of levels of liver transaminase levels: Secondary | ICD-10-CM | POA: Diagnosis present

## 2023-04-24 DIAGNOSIS — E785 Hyperlipidemia, unspecified: Secondary | ICD-10-CM | POA: Diagnosis present

## 2023-04-24 DIAGNOSIS — Z79899 Other long term (current) drug therapy: Secondary | ICD-10-CM

## 2023-04-24 DIAGNOSIS — R34 Anuria and oliguria: Secondary | ICD-10-CM | POA: Diagnosis present

## 2023-04-24 LAB — I-STAT CG4 LACTIC ACID, ED: Lactic Acid, Venous: 3.4 mmol/L (ref 0.5–1.9)

## 2023-04-24 LAB — I-STAT ARTERIAL BLOOD GAS, ED
Acid-base deficit: 2 mmol/L (ref 0.0–2.0)
Acid-base deficit: 8 mmol/L — ABNORMAL HIGH (ref 0.0–2.0)
Bicarbonate: 20.1 mmol/L (ref 20.0–28.0)
Bicarbonate: 16.4 mmol/L — ABNORMAL LOW (ref 20.0–28.0)
Calcium, Ion: 1.17 mmol/L (ref 1.15–1.40)
Calcium, Ion: 1.13 mmol/L — ABNORMAL LOW (ref 1.15–1.40)
HCT: 27 % — ABNORMAL LOW (ref 36.0–46.0)
HCT: 24 % — ABNORMAL LOW (ref 36.0–46.0)
Hemoglobin: 9.2 g/dL — ABNORMAL LOW (ref 12.0–15.0)
Hemoglobin: 8.2 g/dL — ABNORMAL LOW (ref 12.0–15.0)
O2 Saturation: 87 %
O2 Saturation: 99 %
Patient temperature: 101.3
Patient temperature: 98.5
Potassium: 2.8 mmol/L — ABNORMAL LOW (ref 3.5–5.1)
Potassium: 2.7 mmol/L — CL (ref 3.5–5.1)
Sodium: 146 mmol/L — ABNORMAL HIGH (ref 135–145)
Sodium: 146 mmol/L — ABNORMAL HIGH (ref 135–145)
TCO2: 21 mmol/L — ABNORMAL LOW (ref 22–32)
TCO2: 17 mmol/L — ABNORMAL LOW (ref 22–32)
pCO2 arterial: 28 mmHg — ABNORMAL LOW (ref 32–48)
pCO2 arterial: 27.4 mmHg — ABNORMAL LOW (ref 32–48)
pH, Arterial: 7.471 — ABNORMAL HIGH (ref 7.35–7.45)
pH, Arterial: 7.384 (ref 7.35–7.45)
pO2, Arterial: 52 mmHg — ABNORMAL LOW (ref 83–108)
pO2, Arterial: 124 mmHg — ABNORMAL HIGH (ref 83–108)

## 2023-04-24 LAB — CBC WITH DIFFERENTIAL/PLATELET
Abs Immature Granulocytes: 0 10*3/uL (ref 0.00–0.07)
Basophils Absolute: 0 10*3/uL (ref 0.0–0.1)
Basophils Relative: 0 %
Eosinophils Absolute: 0 10*3/uL (ref 0.0–0.5)
Eosinophils Relative: 0 %
HCT: 33.1 % — ABNORMAL LOW (ref 36.0–46.0)
Hemoglobin: 10.4 g/dL — ABNORMAL LOW (ref 12.0–15.0)
Lymphocytes Relative: 5 %
Lymphs Abs: 0.4 10*3/uL — ABNORMAL LOW (ref 0.7–4.0)
MCH: 27.6 pg (ref 26.0–34.0)
MCHC: 31.4 g/dL (ref 30.0–36.0)
MCV: 87.8 fL (ref 80.0–100.0)
Monocytes Absolute: 0.1 10*3/uL (ref 0.1–1.0)
Monocytes Relative: 1 %
Neutro Abs: 7.5 10*3/uL (ref 1.7–7.7)
Neutrophils Relative %: 94 %
Platelets: 347 10*3/uL (ref 150–400)
RBC: 3.77 MIL/uL — ABNORMAL LOW (ref 3.87–5.11)
RDW: 16.7 % — ABNORMAL HIGH (ref 11.5–15.5)
WBC: 8 10*3/uL (ref 4.0–10.5)
nRBC: 0 % (ref 0.0–0.2)
nRBC: 0 /100{WBCs}

## 2023-04-24 LAB — BASIC METABOLIC PANEL
Anion gap: 13 (ref 5–15)
BUN: 35 mg/dL — ABNORMAL HIGH (ref 8–23)
CO2: 16 mmol/L — ABNORMAL LOW (ref 22–32)
Calcium: 7.6 mg/dL — ABNORMAL LOW (ref 8.9–10.3)
Chloride: 114 mmol/L — ABNORMAL HIGH (ref 98–111)
Creatinine, Ser: 1.35 mg/dL — ABNORMAL HIGH (ref 0.44–1.00)
GFR, Estimated: 41 mL/min — ABNORMAL LOW (ref >60)
Glucose, Bld: 104 mg/dL — ABNORMAL HIGH (ref 70–99)
Potassium: 2.9 mmol/L — ABNORMAL LOW (ref 3.5–5.1)
Sodium: 143 mmol/L (ref 135–145)

## 2023-04-24 LAB — COMPREHENSIVE METABOLIC PANEL
ALT: 73 U/L — ABNORMAL HIGH (ref 0–44)
AST: 101 U/L — ABNORMAL HIGH (ref 15–41)
Albumin: 1.8 g/dL — ABNORMAL LOW (ref 3.5–5.0)
Alkaline Phosphatase: 46 U/L (ref 38–126)
Anion gap: 11 (ref 5–15)
BUN: 39 mg/dL — ABNORMAL HIGH (ref 8–23)
CO2: 20 mmol/L — ABNORMAL LOW (ref 22–32)
Calcium: 8 mg/dL — ABNORMAL LOW (ref 8.9–10.3)
Chloride: 112 mmol/L — ABNORMAL HIGH (ref 98–111)
Creatinine, Ser: 1.52 mg/dL — ABNORMAL HIGH (ref 0.44–1.00)
GFR, Estimated: 36 mL/min — ABNORMAL LOW (ref >60)
Glucose, Bld: 54 mg/dL — ABNORMAL LOW (ref 70–99)
Potassium: 4.3 mmol/L (ref 3.5–5.1)
Sodium: 143 mmol/L (ref 135–145)
Total Bilirubin: 1.8 mg/dL — ABNORMAL HIGH (ref 0.0–1.2)
Total Protein: 6 g/dL — ABNORMAL LOW (ref 6.5–8.1)

## 2023-04-24 LAB — AMMONIA: Ammonia: 22 umol/L (ref 9–35)

## 2023-04-24 LAB — PHOSPHORUS: Phosphorus: 2.7 mg/dL (ref 2.5–4.6)

## 2023-04-24 LAB — TROPONIN I (HIGH SENSITIVITY)
Troponin I (High Sensitivity): 236 ng/L (ref <18)
Troponin I (High Sensitivity): 337 ng/L (ref <18)

## 2023-04-24 LAB — LACTIC ACID, PLASMA: Lactic Acid, Venous: 5.2 mmol/L (ref 0.5–1.9)

## 2023-04-24 LAB — RESP PANEL BY RT-PCR (RSV, FLU A&B, COVID)  RVPGX2
Influenza A by PCR: NEGATIVE
Influenza B by PCR: NEGATIVE
Resp Syncytial Virus by PCR: NEGATIVE
SARS Coronavirus 2 by RT PCR: NEGATIVE

## 2023-04-24 LAB — TSH: TSH: 0.148 u[IU]/mL — ABNORMAL LOW (ref 0.350–4.500)

## 2023-04-24 LAB — HEMOGLOBIN A1C
Hgb A1c MFr Bld: 5.2 % (ref 4.8–5.6)
Mean Plasma Glucose: 102.54 mg/dL

## 2023-04-24 LAB — GLUCOSE, CAPILLARY
Glucose-Capillary: 104 mg/dL — ABNORMAL HIGH (ref 70–99)
Glucose-Capillary: 82 mg/dL (ref 70–99)

## 2023-04-24 LAB — PROTIME-INR
INR: 1.2 (ref 0.8–1.2)
Prothrombin Time: 15.3 s — ABNORMAL HIGH (ref 11.4–15.2)

## 2023-04-24 LAB — APTT: aPTT: 23 s — ABNORMAL LOW (ref 24–36)

## 2023-04-24 LAB — MAGNESIUM
Magnesium: 1.9 mg/dL (ref 1.7–2.4)
Magnesium: 1.6 mg/dL — ABNORMAL LOW (ref 1.7–2.4)

## 2023-04-24 MED ORDER — ACETAMINOPHEN 650 MG RE SUPP
650.0000 mg | RECTAL | Status: DC | PRN
Start: 2023-04-24 — End: 2023-04-25

## 2023-04-24 MED ORDER — REVEFENACIN 175 MCG/3ML IN SOLN
175.0000 ug | Freq: Every day | RESPIRATORY_TRACT | Status: DC
  Administered 2023-04-25: 175 ug via RESPIRATORY_TRACT
  Filled 2023-04-24: qty 3

## 2023-04-24 MED ORDER — VANCOMYCIN VARIABLE DOSE PER UNSTABLE RENAL FUNCTION (PHARMACIST DOSING): Status: DC

## 2023-04-24 MED ORDER — LACTATED RINGERS IV SOLN: INTRAVENOUS | Status: DC

## 2023-04-24 MED ORDER — BUDESONIDE 0.25 MG/2ML IN SUSP
0.2500 mg | Freq: Two times a day (BID) | RESPIRATORY_TRACT | Status: DC
  Administered 2023-04-25 (×2): 0.25 mg via RESPIRATORY_TRACT
  Filled 2023-04-24 (×2): qty 2

## 2023-04-24 MED ORDER — METRONIDAZOLE 500 MG/100ML IV SOLN
500.0000 mg | Freq: Once | INTRAVENOUS | Status: AC
  Administered 2023-04-24: 500 mg via INTRAVENOUS
  Filled 2023-04-24: qty 100

## 2023-04-24 MED ORDER — NOREPINEPHRINE 4 MG/250ML-% IV SOLN
INTRAVENOUS | Status: AC
  Filled 2023-04-24: qty 250

## 2023-04-24 MED ORDER — POTASSIUM CHLORIDE 10 MEQ/100ML IV SOLN
10.0000 meq | INTRAVENOUS | Status: AC
  Administered 2023-04-24 – 2023-04-25 (×6): 10 meq via INTRAVENOUS
  Filled 2023-04-24 (×6): qty 100

## 2023-04-24 MED ORDER — MAGNESIUM SULFATE 4 GM/100ML IV SOLN
4.0000 g | Freq: Once | INTRAVENOUS | Status: AC
  Administered 2023-04-24: 4 g via INTRAVENOUS
  Filled 2023-04-24: qty 100

## 2023-04-24 MED ORDER — LACTATED RINGERS IV BOLUS (SEPSIS)
1000.0000 mL | Freq: Once | INTRAVENOUS | Status: AC
  Administered 2023-04-24: 1000 mL via INTRAVENOUS

## 2023-04-24 MED ORDER — LACTATED RINGERS IV BOLUS (SEPSIS)
250.0000 mL | Freq: Once | INTRAVENOUS | Status: AC
  Administered 2023-04-24: 250 mL via INTRAVENOUS

## 2023-04-24 MED ORDER — CHLORHEXIDINE GLUCONATE CLOTH 2 % EX PADS
6.0000 | MEDICATED_PAD | Freq: Every day | CUTANEOUS | Status: DC
  Administered 2023-04-24 – 2023-04-25 (×2): 6 via TOPICAL

## 2023-04-24 MED ORDER — DOCUSATE SODIUM 100 MG PO CAPS: 100.0000 mg | ORAL_CAPSULE | Freq: Two times a day (BID) | ORAL | Status: DC | PRN

## 2023-04-24 MED ORDER — PIPERACILLIN-TAZOBACTAM IN DEX 2-0.25 GM/50ML IV SOLN
2.2500 g | Freq: Three times a day (TID) | INTRAVENOUS | Status: DC
  Administered 2023-04-25: 2.25 g via INTRAVENOUS
  Filled 2023-04-24 (×2): qty 50

## 2023-04-24 MED ORDER — ALBUTEROL SULFATE (2.5 MG/3ML) 0.083% IN NEBU: 2.5000 mg | INHALATION_SOLUTION | RESPIRATORY_TRACT | Status: DC | PRN

## 2023-04-24 MED ORDER — DEXTROSE 50 % IV SOLN
25.0000 mL | Freq: Once | INTRAVENOUS | Status: AC
  Administered 2023-04-24: 25 mL via INTRAVENOUS
  Filled 2023-04-24: qty 50

## 2023-04-24 MED ORDER — IOHEXOL 350 MG/ML SOLN
50.0000 mL | Freq: Once | INTRAVENOUS | Status: AC | PRN
  Administered 2023-04-24: 50 mL via INTRAVENOUS

## 2023-04-24 MED ORDER — POLYETHYLENE GLYCOL 3350 17 G PO PACK: 17.0000 g | PACK | Freq: Every day | ORAL | Status: DC | PRN

## 2023-04-24 MED ORDER — LEVETIRACETAM IN NACL 500 MG/100ML IV SOLN
500.0000 mg | Freq: Two times a day (BID) | INTRAVENOUS | Status: DC
  Administered 2023-04-24 – 2023-04-25 (×3): 500 mg via INTRAVENOUS
  Filled 2023-04-24 (×3): qty 100

## 2023-04-24 MED ORDER — VANCOMYCIN HCL IN DEXTROSE 1-5 GM/200ML-% IV SOLN
1000.0000 mg | Freq: Once | INTRAVENOUS | Status: AC
  Administered 2023-04-24: 1000 mg via INTRAVENOUS
  Filled 2023-04-24: qty 200

## 2023-04-24 MED ORDER — SODIUM CHLORIDE 0.9 % IV SOLN
2.0000 g | Freq: Once | INTRAVENOUS | Status: AC
  Administered 2023-04-24: 2 g via INTRAVENOUS
  Filled 2023-04-24: qty 12.5

## 2023-04-24 MED ORDER — POTASSIUM CHLORIDE 10 MEQ/100ML IV SOLN: 10.0000 meq | Freq: Once | INTRAVENOUS | Status: DC

## 2023-04-24 MED ORDER — ACETAMINOPHEN 650 MG RE SUPP
650.0000 mg | Freq: Once | RECTAL | Status: AC
  Administered 2023-04-24: 650 mg via RECTAL
  Filled 2023-04-24: qty 1

## 2023-04-24 MED ORDER — NOREPINEPHRINE 4 MG/250ML-% IV SOLN
2.0000 ug/min | INTRAVENOUS | Status: DC
  Administered 2023-04-24: 2 ug/min via INTRAVENOUS
  Administered 2023-04-25: 10 ug/min via INTRAVENOUS
  Administered 2023-04-25: 9 ug/min via INTRAVENOUS
  Filled 2023-04-24 (×2): qty 250

## 2023-04-24 MED ORDER — ARFORMOTEROL TARTRATE 15 MCG/2ML IN NEBU
15.0000 ug | INHALATION_SOLUTION | Freq: Two times a day (BID) | RESPIRATORY_TRACT | Status: DC
  Administered 2023-04-25 (×2): 15 ug via RESPIRATORY_TRACT
  Filled 2023-04-24 (×2): qty 2

## 2023-04-24 MED ORDER — SODIUM CHLORIDE 0.9 % IV SOLN
250.0000 mL | INTRAVENOUS | Status: AC
  Administered 2023-04-24: 250 mL via INTRAVENOUS

## 2023-04-24 MED ORDER — SODIUM CHLORIDE 3 % IN NEBU
4.0000 mL | INHALATION_SOLUTION | Freq: Two times a day (BID) | RESPIRATORY_TRACT | Status: DC
  Administered 2023-04-25 (×2): 4 mL via RESPIRATORY_TRACT
  Filled 2023-04-24 (×2): qty 4

## 2023-04-24 NOTE — ED Notes (Signed)
 Still unable to obtain O2 saturation via pulse oximetry

## 2023-04-24 NOTE — ED Notes (Signed)
 Was only able to get 1 set of blood culture. MD made aware.

## 2023-04-24 NOTE — Progress Notes (Signed)
 eLink Physician-Brief Progress Note Patient Name: [name] [name] DOB: 02/09/1950 MRN: [id number]   Date of Service  04/24/2023  HPI/Events of Note  73 year old female with pertinent PMH HTN, HLD, STEMI s/p CABG, seizures, CVA, dementia, A-fib not on Eliquis presents to [facility] ED on 3/23 with AMS/sepsis.   Patient has been febrile, tachypneic, hypotensive saturating 97% on nonrebreather.  Received broad-spectrum antibiotics.  Results show electrolyte disturbances, metabolic acidosis, elevated creatinine, elevated lactic acid.  Cultures are pending.  Lactic acid is elevated to 5.2.  CT chest reveals that the patient is severely limited with regards to pulmonary function.-Dense consolidation of the left and severe emphysematous changes throughout the right with lower lobe consolidations.  Concerned that the patient had possible seizures versus tremors with increased work of breathing.  EEG ordered for tomorrow, no postictal state, no persistent encephalopathy.  Receiving her scheduled Keppra.  Starting to become hypotensive.  eICU Interventions  Aggressive pulmonary hygiene therapy.  Switch to heated high flow nasal cannula.  Standard pneumonia workup pending.  Broad-spectrum antibiotics. Add scheduled nebs  Electrolyte repletion.  Unlikely to represent seizures at this point, continue observation and will monitor EEG in the morning.  She has multiorgan dysfunction at baseline with sacral wounds-would strongly benefit from ongoing goals of care conversation and palliative consultation over anything else.  DVT prophylaxis with SCDs GI prophylaxis not indicated   0219 - Severe hypoglycemia, will start D10 in addition to hypoglycemia protocol reassess lytes with am labs  0530  -notified about multiple critical values on CBC.  Repeat draw is pending.  Will send type and screen.  Hemodynamics have changed minimally-still on single digit norepinephrine.  Stat repeat sent.  Given the repeated  hypoglycemia overnight, will increase dextrose infusion to 50 cc    Intervention Category Evaluation Type: New Patient Evaluation  [name] 04/24/2023, 11:20 PM

## 2023-04-24 NOTE — Sepsis Progress Note (Signed)
 Elink will follow per sepsis protocol.

## 2023-04-24 NOTE — ED Notes (Signed)
 Patient transported to CT

## 2023-04-24 NOTE — ED Notes (Signed)
 Unable to pick up spo2. MD knapp aware.

## 2023-04-24 NOTE — ED Triage Notes (Signed)
 Pt BIB [facility] from [place] [facility] as a code sepsis. Pt has fever 101.6. pt is poor historian. Stroke in the past. Pt facility, pt is able to say some words at baseline but not today. Pt has a big ulcer on her bottom. Also 2 ulcers on her heels.

## 2023-04-24 NOTE — Progress Notes (Signed)
 Pharmacy Antibiotic Note  [name] [name] is a 73 y.o. female admitted on 04/24/2023 with pneumonia.  Pharmacy has been consulted for vanc/zosyn dosing.  Pt with multiple medical hx who presented with AMS and possible sepsis. She got vanc/cefepime/flagyl in the ED. CCM ordered vanc/zosyn.   Scr 1.52 (BL ~1), wbc wnl Her previous wt was around 36kg  Plan: Vanc x1 in ED  Level in 2 days Zosyn 2.25g IV q8 MRSA PCR     Temp (24hrs), Avg:99.8 F (37.7 C), Min:98.3 F (36.8 C), Max:101.3 F (38.5 C)  Recent Labs  Lab 04/24/23 1756 04/24/23 1806  WBC 8.0  --   CREATININE 1.52*  --   LATICACIDVEN  --  3.4*    CrCl cannot be calculated (Unknown ideal weight.).    No Known Allergies  Antimicrobials this admission: 3/23 vanc>> 3/23 zosyn>>  Dose adjustments this admission:   Microbiology results: 3/23 flu/COVID/RSV neg 3/23 blood>> 3/23 MRSA PCR>>  Ulyses Southward, PharmD, BCIDP, AAHIVP, CPP Infectious Disease Pharmacist 04/24/2023 9:29 PM

## 2023-04-24 NOTE — ED Provider Notes (Signed)
 [name]   CSN: [id number] Arrival date & time: 04/24/23  1735     History  Chief Complaint  Patient presents with   Code Sepsis    [name] [name] is a 73 y.o. female.  HPI   Patient has a history of hypertension hyperlipidemia ST elevation MI seizures stroke.  At baseline patient is on verbal.  She is bedridden.  Records indicate patient's had stroke and cerebral hemorrhage.  Patient is nonverbal.  She is a resident of a nursing facility.  Patient presented to the ED for evaluation of altered mental status.  Unclear of the time of onset.  On arrival EMS noted that the patient was febrile up to 101 had hypotension with systolic blood pressures in the 60s and 70s.  They initiated their sepsis protocol.  In the ED the patient is nonverbal which is her baseline.  Her eyes are open but she does not communicate or respond to verbal stimuli  Home Medications Prior to Admission medications   Medication Sig Start Date End Date Taking? Authorizing Provider  acetaminophen (TYLENOL) 325 MG tablet Take 2 tablets (650 mg total) by mouth every 4 (four) hours as needed for mild pain (or temp > 37.5 C (99.5 F)). 08/06/22   de Saintclair Halsted, Cortney E, NP  amiodarone (PACERONE) 400 MG tablet Take 1 tablet (400 mg total) by mouth daily. 08/07/22   de Saintclair Halsted, Cortney E, NP  aspirin 81 MG chewable tablet Chew 1 tablet (81 mg total) by mouth daily. 08/07/22   de Saintclair Halsted, Cortney E, NP  atorvastatin (LIPITOR) 40 MG tablet Take 1 tablet (40 mg total) by mouth daily. 08/07/22   de Saintclair Halsted, Cortney E, NP  donepezil (ARICEPT) 5 MG tablet Take 1 tablet (5 mg total) by mouth at bedtime. 03/21/17   Angiulli, Mcarthur Rossetti, PA-C  ezetimibe (ZETIA) 10 MG tablet Take 1 tablet (10 mg total) by mouth daily. 06/25/22 12/22/22  Gillis Santa, MD  levETIRAcetam (KEPPRA) 500 MG tablet Take 1 tablet (500 mg total) by mouth 2 (two) times daily. 08/06/22   de Saintclair Halsted, Cortney E, NP   metoprolol tartrate (LOPRESSOR) 25 MG tablet Take 1 tablet (25 mg total) by mouth 2 (two) times daily. 08/06/22   de Saintclair Halsted, Cortney E, NP  polyethylene glycol (MIRALAX / GLYCOLAX) 17 g packet Take 17 g by mouth daily as needed for mild constipation, moderate constipation or severe constipation. 08/06/22   de Saintclair Halsted, Cortney E, NP  senna-docusate (SENOKOT-S) 8.6-50 MG tablet Take 1 tablet by mouth 2 (two) times daily. 08/06/22   de Saintclair Halsted, Lennox Solders, NP      Allergies    Patient has no known allergies.    Review of Systems   Review of Systems  Physical Exam Updated Vital Signs BP (!) 155/74   Pulse 77   Temp 98.3 F (36.8 C) (Oral)   Resp (!) 31  Physical Exam Vitals and nursing note reviewed.  Constitutional:      Appearance: She is well-developed. She is ill-appearing and toxic-appearing.     Comments: Patient appears severely underweight cachectic contracted  HENT:     Head: Normocephalic and atraumatic.     Right Ear: External ear normal.     Left Ear: External ear normal.  Eyes:     General: No scleral icterus.       Right eye: No discharge.  Left eye: No discharge.     Conjunctiva/sclera: Conjunctivae normal.  Neck:     Trachea: No tracheal deviation.  Cardiovascular:     Rate and Rhythm: Normal rate and regular rhythm.  Pulmonary:     Effort: Pulmonary effort is normal. No respiratory distress.     Breath sounds: Normal breath sounds. No stridor. No wheezing or rales.  Abdominal:     General: Bowel sounds are normal. There is no distension.     Palpations: Abdomen is soft.     Tenderness: There is no abdominal tenderness. There is no guarding or rebound.  Musculoskeletal:        General: No tenderness.     Cervical back: Neck supple.     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     Findings: No rash.  Neurological:     Mental Status: She is alert.     GCS: GCS eye subscore is 4. GCS verbal subscore is 1. GCS motor subscore is 1.     Cranial Nerves: No dysarthria or facial asymmetry.     Sensory: No sensory deficit.     Motor: No abnormal muscle tone or seizure activity.     Coordination: Coordination normal.  Psychiatric:        Mood and Affect: Mood normal.    ED Results / Procedures / Treatments   Labs (all labs ordered are listed, but only abnormal results are displayed) Labs Reviewed  COMPREHENSIVE METABOLIC PANEL - Abnormal; Notable for the following components:      Result Value   Chloride 112 (*)    CO2 20 (*)    Glucose, Bld 54 (*)    BUN 39 (*)    Creatinine, Ser 1.52 (*)    Calcium 8.0 (*)    Total Protein 6.0 (*)    Albumin 1.8 (*)    AST 101 (*)    ALT 73 (*)    Total Bilirubin 1.8 (*)    GFR, Estimated 36 (*)    All other components within normal limits  CBC WITH DIFFERENTIAL/PLATELET - Abnormal; Notable for the following components:   RBC 3.77 (*)    Hemoglobin 10.4 (*)    HCT 33.1 (*)    RDW 16.7 (*)    Lymphs Abs 0.4 (*)    All other components within normal limits  PROTIME-INR - Abnormal; Notable for the following components:   Prothrombin Time 15.3 (*)    All other components within normal limits  APTT - Abnormal; Notable for the following components:   aPTT 23 (*)    All other components within normal limits  I-STAT CG4 LACTIC ACID, ED - Abnormal; Notable for the following components:   Lactic Acid, Venous 3.4 (*)    All other components within normal limits  I-STAT ARTERIAL BLOOD GAS, ED - Abnormal; Notable for the following components:   pH, Arterial 7.471 (*)    pCO2 arterial 28.0 (*)    pO2, Arterial 52 (*)    TCO2 21 (*)    Sodium 146 (*)    Potassium 2.8 (*)    HCT 27.0 (*)     Hemoglobin 9.2 (*)  All other components within normal limits  TROPONIN I (HIGH SENSITIVITY) - Abnormal; Notable for the following components:   Troponin I (High Sensitivity) 236 (*)    All other components within normal limits  RESP PANEL BY RT-PCR (RSV, FLU A&B, COVID)  RVPGX2  CULTURE, BLOOD (ROUTINE X 2)  CULTURE, BLOOD (ROUTINE X 2)  URINALYSIS, W/ REFLEX TO CULTURE (INFECTION SUSPECTED)  BLOOD GAS, ARTERIAL  CBC  BASIC METABOLIC PANEL  PHOSPHORUS  PHOSPHORUS  MAGNESIUM  MAGNESIUM  LACTIC ACID, PLASMA  LACTIC ACID, PLASMA  TROPONIN I (HIGH SENSITIVITY)    EKG EKG Interpretation Date/Time:  Sunday April 24 2023 17:43:15 EDT Ventricular Rate:  90 PR Interval:  149 QRS Duration:  128 QT Interval:  459 QTC Calculation: 562 R Axis:   -61  Text Interpretation: Sinus rhythm Ventricular premature complex Aberrant conduction of SV complex(es) RBBB and LAFB No significant change since last tracing Confirmed by Linwood Dibbles [phone]) on 04/24/2023 6:33:03 PM  Radiology DG Chest Port 1 View Result Date: 04/24/2023 CLINICAL DATA:  Possible sepsis EXAM: PORTABLE CHEST 1 VIEW COMPARISON:  07/23/2022 FINDINGS: Cardiac shadow is mildly prominent but accentuated by the portable technique. The lungs are well aerated bilaterally. Central vascular congestion is noted. Postsurgical changes and loop recorder are again seen. No bony abnormality is noted. IMPRESSION: Mild vascular congestion. Electronically Signed   By: Alcide Clever M.D.   On: 04/24/2023 19:12    Procedures .Critical Care  Performed by: Linwood Dibbles, MD Authorized by: Linwood Dibbles, MD   Critical care provider statement:    Critical care time (minutes):  50   Critical care was time spent personally by me on the following activities:  Development of treatment plan with patient or surrogate, discussions with consultants, evaluation of patient's response to treatment, examination of patient, ordering and review of laboratory  studies, ordering and review of radiographic studies, ordering and performing treatments and interventions, pulse oximetry, re-evaluation of patient's condition and review of old charts     Medications Ordered in ED Medications  lactated ringers infusion ( Intravenous New Bag/Given 04/24/23 1855)  docusate sodium (COLACE) capsule 100 mg (has no administration in time range)  polyethylene glycol (MIRALAX / GLYCOLAX) packet 17 g (has no administration in time range)  acetaminophen (TYLENOL) suppository 650 mg (has no administration in time range)  lactated ringers bolus 1,000 mL (0 mLs Intravenous Stopped 04/24/23 1840)    And  lactated ringers bolus 250 mL (0 mLs Intravenous Stopped 04/24/23 1908)  ceFEPIme (MAXIPIME) 2 g in sodium chloride 0.9 % 100 mL IVPB (0 g Intravenous Stopped 04/24/23 1840)  metroNIDAZOLE (FLAGYL) IVPB 500 mg (0 mg Intravenous Stopped 04/24/23 1952)  vancomycin (VANCOCIN) IVPB 1000 mg/200 mL premix (1,000 mg Intravenous New Bag/Given 04/24/23 1952)  acetaminophen (TYLENOL) suppository 650 mg (650 mg Rectal Given 04/24/23 1808)  dextrose 50 % solution 25 mL (25 mLs Intravenous Given 04/24/23 1947)    ED Course/ Medical Decision Making/ A&P Clinical Course as of 04/24/23 2058  Sun Apr 24, 2023  1755 I spoke with patient's sister.  She thought patient was going to [place] [facility].  She will be coming to [facility] [JK]  [phone] Patient sisters at the bedside.  I reviewed the case with her.  She states that normally the patient is able to say few words.  She has not been able to do that today [JK]  1831 CBC with Differential(!) Hemoglobin slightly decreased compared to previous values [JK]  1831 I-Stat arterial blood  gas, ED [facility] ED, [facility], [facility])(!) Blood gas does show an AA gradient.  pCO2 was actually decreased.  Lactic acid levels elevated [JK]  1905 Blood pressure is improving with hydration.  Respiratory rate has decreased. [JK]  1906 Troponin I (High  Sensitivity)(!!) Troponin elevated to 236 [JK]  1916 Case discussed with pulmonary critical care Dr. Deldrick Linch Bill[place].  She will evaluate the patient the ED to see if she needs to be in the ICU versus monitor closely on the hospital service [JK]    Clinical Course User Index [JK] Linwood Dibbles, MD                                 Medical Decision Making Problems Addressed: Sepsis with acute organ dysfunction and septic shock, due to unspecified organism, unspecified organ dysfunction type [facility]): acute illness or injury that poses a threat to life or bodily functions  Amount and/or Complexity of Data Reviewed Labs: ordered. Decision-making details documented in ED Course. Radiology: ordered and independent interpretation performed.  Risk OTC drugs. Prescription drug management. Decision regarding hospitalization.   Patient present to the ED for evaluation of fever altered mental status.  At baseline patient is very debilitated.  She is underweight.  She is bedridden.  Family states she is usually able to say a few words.  In the ED the patient has been unresponsive although her eyes are open.  Not entirely clear if she is tracking [place].  Patient noted to be febrile and hypotensive sepsis protocol initiated.  Patient noted to have stage IV decubitus ulcer in her sacrum.  She also has ulcers and wounds on her feet.  There is malodorous drainage.  I suspect this is most likely the source of her infection however patient also does have elevated LFTs.  Possible that is related to injury related to her hypotension and shock but will plan on CT scan of her abdomen pelvis to assess for possible infection and assess the severity of her decubitus ulcers.  ABG does show an increased AA gradient although no significant acidosis.  Patient has been started on supplemental oxygen.  We are having difficulty measuring her pulse ox.  Patient's labs also notable for hypoglycemia.  She is extremely underweight.  Suspect  malnutrition related to her chronic illness and possible worsening hyperglycemia related to acute infection.  Troponin is elevated suggestive of myocardial injury most likely related to her hypotension.  No signs of STEMI on her EKG.  Initial i-STAT ABG showed hypokalemia but her metabolic panel showed normal potassium levels.  Patient blood pressure has improved with IV hydration   I did discuss patient's CODE STATUS with the family.  They indicate that patient previously wanted to be full code and would want intubation unless she was brain-dead.  With her severe debilitated state might be helpful to re discuss goals of care.  Case discussed with critical care service regarding her multi organ injury and sepsis.        Final Clinical Impression(s) / ED Diagnoses Final diagnoses:  Sepsis with acute organ dysfunction and septic shock, due to unspecified organism, unspecified organ dysfunction type [facility])  NSTEMI (non-ST elevated myocardial infarction) (HCC)  Hypoglycemia  AKI (acute kidney injury) [facility])    Rx / DC Orders ED Discharge Orders     None         Linwood Dibbles, MD 04/24/23 2217

## 2023-04-24 NOTE — ED Notes (Signed)
 Pt has returned from CT.

## 2023-04-24 NOTE — H&P (Cosign Needed Addendum)
 NAME:  [name] [name], MRN:  [id number], DOB:  [birth year], LOS: 0 ADMISSION DATE:  04/24/2023, CONSULTATION DATE:  3/23 REFERRING MD:  Dr. Lynelle Doctor, CHIEF COMPLAINT:  sepsis   History of Present Illness:  Patient is a 73 year old female with pertinent PMH HTN, HLD, STEMI s/p CABG, seizures, CVA, dementia, A-fib not on Eliquis presents to [facility] ED on 3/23 with AMS/sepsis.  Patient has had multiple strokes.  Most recently admitted on 07/23/2022 with stroke with hemorrhagic conversion.  Patient was on Eliquis which was stopped.  Discharged on 08/06/2022.  Patient now resides at nursing home.  At baseline patient minimally responsive.  Patient is contracted and bedbound at baseline.   On 3/23 patient having increased AMS.  EMS arrived and patient febrile 101 F and hypotensive SBP 60s to 70s.  Given fluids.  In ED patient nonverbal.  Eyes open to voice but does not respond to verbal stimuli.  BP improved with IV fluids.  Unable to obtain O2 sats and ABG showing PaO2 52.  Patient placed on NRB.  CXR with some atelectasis versus infiltrate on left lung personally reviewed.  Patient febrile 101 F and WBC 8.  Cultures obtained and patient placed on broad-spectrum antibiotics.  UA pending.  LA 3.4.  COVID/flu/RSV negative.  Troponin 236.  EKG with no ST elevation; QTc 562.  Glucose 54 given dextrose.  CT head, chest, abdomen, pelvis pending.  PCCM consulted for ICU admission.  Pertinent ED labs: Creat 1.52, albumin 1.8, AST 101, ALT 73, Hgb 10.4, troponin 236  Pertinent  Medical History   Past Medical History:  Diagnosis Date   Acute ischemic left MCA stroke (HCC) 03/11/2017   Hyperlipidemia LDL goal < 70 07/24/2012   Hypertension    S/P CABG x 5 : LIMA-LAD, SVG-OM1-OM2, SVG-(Y)-DIAG-PDA. 07/25/12 07/28/2012   POSTOPERATIVE DIAGNOSIS: Severe 3-vessel coronary disease, status post  myocardial infarction.  PROCEDURE: Median sternotomy, extracorporeal circulation, coronary  artery bypass grafting x5 (left internal  mammary artery to LAD,  sequential saphenous vein graft to obtuse marginal 1, obtuse marginal 2,  and posterior descending with Y graft to 1st diagonal), endoscopic vein  harvest, left thigh.     Seizures (HCC)    "years ago" (03/11/2017)   STEMI (ST elevation myocardial infarction), secondary to disease of RCA, with disease of LAD and diag. branch and chronic non dominant LCX occlusion for CABG  07/21/2012   Stroke [facility]) ?08/2012   "right sided weakness; speech isssues since" (03/11/2017)   Tobacco abuse 07/21/2012     Significant Hospital Events: Including procedures, antibiotic start and stop dates in addition to other pertinent events   3/23 admitted to [facility] w/ ams and hypotension/sepsis.  Interim History / Subjective:  See above  Objective   Blood pressure (!) 99/51, pulse 77, temperature (!) 101.3 F (38.5 C), temperature source Rectal, resp. rate 18.       No intake or output data in the 24 hours ending 04/24/23 1919 There were no vitals filed for this visit.  Examination: General: Chronically cachectic ill appearing female in no acute distress HEENT: MM pink/dry Neuro: contracted; nonverbal; perrl; cough/gag present CV: s1s2, RRR, no m/r/g PULM:  dim BS bilaterally; NRB unable to obtain sats (pao2 on abg 50) GI: soft, bsx4 active  Extremities: warm/dry Skin: sacral ulcer with dressing in place; b/l ankle wound w/ dressings in place   Resolved Hospital Problem list     Assessment & Plan:   Acute respiratory failure w/ hypoxia Possible left sided pna  vs. Aspiration pna vs. atelectasis Plan: -will switch from NRB to [facility] for sats >92% -pulm toiletry: cpt -nt suction prn -high risk intubation; aspiration precautions -zosyn/vanc -follow cultures -check rvp, urine Legionella/strep, MRSA PCR  Sepsis: possible pna; pan scan pending Plan: -CT chest, abdomen, pelvis pending -UA pending -Follow cultures -Zosyn/vanc -Given IV fluids; trend LA  Acute encephalopathy:  presume sepsis related and hypoglycemic Hx of stroke and seizures Hx of dementia -Patient mildly responsive at baseline; answers yes and no questions; contracted and bedbound at baseline Plan: -Treat sepsis as above -treat hypoglycemia -Check CT head and EEG -Check ammonia, TSH -limit sedating meds -resume home keppra bid  Hypoglycemia Plan: -given dextrose -cbg monitoring  Elevated troponin: Likely demand ischemia from sepsis Prolonged qtc Plan: -Trend troponin -avoid qtc prolonging agents -check mag  AKI Plan: -given iv fluids -Trend BMP / urinary output -Replace electrolytes as indicated -Avoid nephrotoxic agents, ensure adequate renal perfusion  Hx of afib Hx of HTN Hx of HLD Hx of STEMI s/p CABG Plan: -rate controlled and appears regular -hold amio while npo -tele monitoring -not on ac given recent hemorrhagic stroke -hold metoprolol given soft bp on admit -hold asa and statin while npo  Elevated LFTs Plan: -trend cmp -ct abd/pelvis pending -check ruq ultrasound  Anemia Plan: -trend cbc  Sacral ulcer poa B/l ankle wounds poa Plan: -woc consult  Protein calorie malnutrition Dysphagia Plan: -cor track tomorrow -rd consult for enteral nutrition needs   Best Practice (right click and "Reselect all SmartList Selections" daily)   Diet/type: NPO DVT prophylaxis SCD Pressure ulcer(s): present on admission  GI prophylaxis: N/A Lines: N/A Foley:  N/A Code Status:  full code Last date of multidisciplinary goals of care discussion [3/23 updated family given patient overall baseline and asked about code status. She states her wishes were that she would want everything done unless she was brain dead and to remain full code.]  Labs   CBC: Recent Labs  Lab 04/24/23 1756 04/24/23 1759  WBC 8.0  --   NEUTROABS 7.5  --   HGB 10.4* 9.2*  HCT 33.1* 27.0*  MCV 87.8  --   PLT 347  --     Basic Metabolic Panel: Recent Labs  Lab 04/24/23 1756  04/24/23 1759  NA 143 146*  K 4.3 2.8*  CL 112*  --   CO2 20*  --   GLUCOSE 54*  --   BUN 39*  --   CREATININE 1.52*  --   CALCIUM 8.0*  --    GFR: CrCl cannot be calculated (Unknown ideal weight.). Recent Labs  Lab 04/24/23 1756 04/24/23 1806  WBC 8.0  --   LATICACIDVEN  --  3.4*    Liver Function Tests: Recent Labs  Lab 04/24/23 1756  AST 101*  ALT 73*  ALKPHOS 46  BILITOT 1.8*  PROT 6.0*  ALBUMIN 1.8*   No results for input(s): "LIPASE", "AMYLASE" in the last 168 hours. No results for input(s): "AMMONIA" in the last 168 hours.  ABG    Component Value Date/Time   PHART 7.471 (H) 04/24/2023 1759   PCO2ART 28.0 (L) 04/24/2023 1759   PO2ART 52 (L) 04/24/2023 1759   HCO3 20.1 04/24/2023 1759   TCO2 21 (L) 04/24/2023 1759   ACIDBASEDEF 2.0 04/24/2023 1759   O2SAT 87 04/24/2023 1759     Coagulation Profile: Recent Labs  Lab 04/24/23 1756  INR 1.2    Cardiac Enzymes: No results for input(s): "CKTOTAL", "CKMB", "CKMBINDEX", "TROPONINI" in the  last 168 hours.  HbA1C: Hgb A1c MFr Bld  Date/Time Value Ref Range Status  07/24/2022 04:53 AM 4.9 4.8 - 5.6 % Final    Comment:    (NOTE) Pre diabetes:          5.7%-6.4%  Diabetes:              >6.4%  Glycemic control for   <7.0% adults with diabetes   06/22/2022 09:17 AM 5.3 4.8 - 5.6 % Final    Comment:    (NOTE) Pre diabetes:          5.7%-6.4%  Diabetes:              >6.4%  Glycemic control for   <7.0% adults with diabetes     CBG: No results for input(s): "GLUCAP" in the last 168 hours.  Review of Systems:   Patient is encephalopathic; therefore, history has been obtained from chart review.    Past Medical History:  She,  has a past medical history of Acute ischemic left MCA stroke (HCC) (03/11/2017), Hyperlipidemia LDL goal < 70 (07/24/2012), Hypertension, S/P CABG x 5 : LIMA-LAD, SVG-OM1-OM2, SVG-(Y)-DIAG-PDA. 07/25/12 (07/28/2012), Seizures (HCC), STEMI (ST elevation myocardial  infarction), secondary to disease of RCA, with disease of LAD and diag. branch and chronic non dominant LCX occlusion for CABG  (07/21/2012), Stroke (HCC) (?08/2012), and Tobacco abuse (07/21/2012).   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   LEFT HEART CATHETERIZATION WITH CORONARY ANGIOGRAM N/A 07/21/2012   Procedure: LEFT HEART CATHETERIZATION WITH CORONARY ANGIOGRAM;  Surgeon: [name], MD;  Location: [facility] CATH LAB;  Service: Cardiovascular;  Laterality: N/A;   LOOP RECORDER IMPLANT  08-15-2012   Medtronic LinQ implanted by Dr Johney Frame for cryptogenic stroke   TEE WITHOUT CARDIOVERSION N/A 08/15/2012   Procedure: TRANSESOPHAGEAL ECHOCARDIOGRAM (TEE);  Surgeon: Wendall Stade, MD;  Location: [facility] ENDOSCOPY;  Service: Cardiovascular;  Laterality: N/A;   TUBAL LIGATION       Social History:   reports that she has been smoking cigarettes. She started smoking about 10 years ago. She has a 51 pack-year smoking history. She has never used smokeless tobacco. She reports that she does not drink alcohol and does not use drugs.   Family History:  Her family history includes CAD in her brother; Hypertension in her mother; Stroke in her brother.   Allergies No Known Allergies   Home Medications  Prior to Admission medications   Medication Sig Start Date End Date Taking? Authorizing Provider  acetaminophen (TYLENOL) 325 MG tablet Take 2 tablets (650 mg total) by mouth every 4 (four) hours as needed for mild pain (or temp > 37.5 C (99.5 F)). 08/06/22   de Saintclair Halsted, Cortney E, NP  amiodarone (PACERONE) 400 MG tablet Take 1 tablet (400 mg total) by mouth daily. 08/07/22   de Saintclair Halsted, Cortney E, NP  aspirin 81 MG chewable tablet Chew 1 tablet (81 mg total) by mouth daily. 08/07/22   de Saintclair Halsted, Cortney E,  NP  atorvastatin (LIPITOR) 40 MG tablet Take 1 tablet (40 mg total) by mouth daily. 08/07/22   de Saintclair Halsted, Cortney E, NP  donepezil (ARICEPT) 5 MG tablet Take 1 tablet (5 mg total) by mouth at bedtime. 03/21/17   Angiulli, Mcarthur Rossetti, PA-C  ezetimibe (ZETIA) 10 MG tablet Take 1 tablet (10 mg total) by mouth daily. 06/25/22 12/22/22  Gillis Santa, MD  levETIRAcetam (KEPPRA) 500 MG tablet Take 1 tablet (500 mg total) by mouth 2 (two) times daily. 08/06/22   de Saintclair Halsted, Cortney E, NP  metoprolol tartrate (LOPRESSOR) 25 MG tablet Take 1 tablet (25 mg total) by mouth 2 (two) times daily. 08/06/22   de Saintclair Halsted, Cortney E, NP  polyethylene glycol (MIRALAX / GLYCOLAX) 17 g packet Take 17 g by mouth daily as needed for mild constipation, moderate constipation or severe constipation. 08/06/22   de Saintclair Halsted, Cortney E, NP  senna-docusate (SENOKOT-S) 8.6-50 MG tablet Take 1 tablet by mouth 2 (two) times daily. 08/06/22   de Verdis Prime, NP     Critical care time: 45 minutes     JD Anselm Lis [facility] 04/24/2023, 7:19 PM  Please see [email or web address] for pager details.  From 7A-7P if no response, please call [phone]. After hours, please call ELink [phone].

## 2023-04-25 DIAGNOSIS — Z515 Encounter for palliative care: Secondary | ICD-10-CM | POA: Diagnosis not present

## 2023-04-25 DIAGNOSIS — J150 Pneumonia due to Klebsiella pneumoniae: Secondary | ICD-10-CM

## 2023-04-25 DIAGNOSIS — R6521 Severe sepsis with septic shock: Secondary | ICD-10-CM

## 2023-04-25 DIAGNOSIS — B961 Klebsiella pneumoniae [K. pneumoniae] as the cause of diseases classified elsewhere: Secondary | ICD-10-CM

## 2023-04-25 DIAGNOSIS — G9341 Metabolic encephalopathy: Secondary | ICD-10-CM

## 2023-04-25 DIAGNOSIS — Z66 Do not resuscitate: Secondary | ICD-10-CM | POA: Diagnosis not present

## 2023-04-25 DIAGNOSIS — J9601 Acute respiratory failure with hypoxia: Secondary | ICD-10-CM | POA: Diagnosis not present

## 2023-04-25 DIAGNOSIS — Z7189 Other specified counseling: Secondary | ICD-10-CM | POA: Diagnosis not present

## 2023-04-25 DIAGNOSIS — A419 Sepsis, unspecified organism: Secondary | ICD-10-CM | POA: Diagnosis not present

## 2023-04-25 LAB — HEPATIC FUNCTION PANEL
ALT: 72 U/L — ABNORMAL HIGH (ref 0–44)
AST: 125 U/L — ABNORMAL HIGH (ref 15–41)
Albumin: 1.5 g/dL — ABNORMAL LOW (ref 3.5–5.0)
Alkaline Phosphatase: 37 U/L — ABNORMAL LOW (ref 38–126)
Bilirubin, Direct: 0.5 mg/dL — ABNORMAL HIGH (ref 0.0–0.2)
Indirect Bilirubin: 0.5 mg/dL (ref 0.3–0.9)
Total Bilirubin: 1 mg/dL (ref 0.0–1.2)
Total Protein: 4.8 g/dL — ABNORMAL LOW (ref 6.5–8.1)

## 2023-04-25 LAB — CBC
HCT: 9.3 % — ABNORMAL LOW (ref 36.0–46.0)
HCT: 28.9 % — ABNORMAL LOW (ref 36.0–46.0)
Hemoglobin: 2.8 g/dL — CL (ref 12.0–15.0)
Hemoglobin: 9.5 g/dL — ABNORMAL LOW (ref 12.0–15.0)
MCH: 27.7 pg (ref 26.0–34.0)
MCH: 27.7 pg (ref 26.0–34.0)
MCHC: 30.1 g/dL (ref 30.0–36.0)
MCHC: 32.9 g/dL (ref 30.0–36.0)
MCV: 92.1 fL (ref 80.0–100.0)
MCV: 84.3 fL (ref 80.0–100.0)
Platelets: 76 10*3/uL — ABNORMAL LOW (ref 150–400)
Platelets: 229 10*3/uL (ref 150–400)
RBC: 1.01 MIL/uL — ABNORMAL LOW (ref 3.87–5.11)
RBC: 3.43 MIL/uL — ABNORMAL LOW (ref 3.87–5.11)
RDW: 16.8 % — ABNORMAL HIGH (ref 11.5–15.5)
RDW: 16.6 % — ABNORMAL HIGH (ref 11.5–15.5)
WBC: 2.5 10*3/uL — ABNORMAL LOW (ref 4.0–10.5)
WBC: 5.6 10*3/uL (ref 4.0–10.5)
nRBC: 0 % (ref 0.0–0.2)
nRBC: 0.4 % — ABNORMAL HIGH (ref 0.0–0.2)

## 2023-04-25 LAB — RESPIRATORY PANEL BY PCR

## 2023-04-25 LAB — GLUCOSE, CAPILLARY
Glucose-Capillary: 127 mg/dL — ABNORMAL HIGH (ref 70–99)
Glucose-Capillary: 37 mg/dL — CL (ref 70–99)
Glucose-Capillary: 133 mg/dL — ABNORMAL HIGH (ref 70–99)
Glucose-Capillary: 66 mg/dL — ABNORMAL LOW (ref 70–99)
Glucose-Capillary: 125 mg/dL — ABNORMAL HIGH (ref 70–99)
Glucose-Capillary: 104 mg/dL — ABNORMAL HIGH (ref 70–99)
Glucose-Capillary: 140 mg/dL — ABNORMAL HIGH (ref 70–99)

## 2023-04-25 LAB — BLOOD CULTURE ID PANEL (REFLEXED) - BCID2

## 2023-04-25 LAB — TYPE AND SCREEN
ABO/RH(D): O POS
Antibody Screen: NEGATIVE

## 2023-04-25 LAB — MAGNESIUM: Magnesium: 2.8 mg/dL — ABNORMAL HIGH (ref 1.7–2.4)

## 2023-04-25 LAB — PROCALCITONIN
Procalcitonin: 2.29 ng/mL
Procalcitonin: 3.19 ng/mL

## 2023-04-25 LAB — MRSA NEXT GEN BY PCR, NASAL: MRSA by PCR Next Gen: DETECTED — AB

## 2023-04-25 LAB — BASIC METABOLIC PANEL
Anion gap: 10 (ref 5–15)
BUN: 35 mg/dL — ABNORMAL HIGH (ref 8–23)
CO2: 16 mmol/L — ABNORMAL LOW (ref 22–32)
Calcium: 7.7 mg/dL — ABNORMAL LOW (ref 8.9–10.3)
Chloride: 114 mmol/L — ABNORMAL HIGH (ref 98–111)
Creatinine, Ser: 1.17 mg/dL — ABNORMAL HIGH (ref 0.44–1.00)
GFR, Estimated: 49 mL/min — ABNORMAL LOW (ref >60)
Glucose, Bld: 133 mg/dL — ABNORMAL HIGH (ref 70–99)
Potassium: 3.8 mmol/L (ref 3.5–5.1)
Sodium: 140 mmol/L (ref 135–145)

## 2023-04-25 LAB — PHOSPHORUS: Phosphorus: 1.9 mg/dL — ABNORMAL LOW (ref 2.5–4.6)

## 2023-04-25 LAB — LACTIC ACID, PLASMA: Lactic Acid, Venous: 5.8 mmol/L (ref 0.5–1.9)

## 2023-04-25 MED ORDER — MIDAZOLAM-SODIUM CHLORIDE 100-0.9 MG/100ML-% IV SOLN
0.0000 mg/h | INTRAVENOUS | Status: DC
  Administered 2023-04-25: 1 mg/h via INTRAVENOUS
  Filled 2023-04-25: qty 100

## 2023-04-25 MED ORDER — DEXTROSE 50 % IV SOLN
INTRAVENOUS | Status: AC
Start: 2023-04-25 — End: 2023-04-25
  Filled 2023-04-25: qty 50

## 2023-04-25 MED ORDER — DEXTROSE 50 % IV SOLN
INTRAVENOUS | Status: AC
  Filled 2023-04-25: qty 50

## 2023-04-25 MED ORDER — DAKINS (1/4 STRENGTH) 0.125 % EX SOLN
Freq: Every day | CUTANEOUS | Status: DC
Start: 2023-04-25 — End: 2023-04-28
  Filled 2023-04-25: qty 473

## 2023-04-25 MED ORDER — ACETAMINOPHEN 325 MG PO TABS: 650.0000 mg | ORAL_TABLET | Freq: Four times a day (QID) | ORAL | Status: DC | PRN

## 2023-04-25 MED ORDER — DEXTROSE 10 % IV SOLN: INTRAVENOUS | Status: DC

## 2023-04-25 MED ORDER — ACETAMINOPHEN 650 MG RE SUPP: 650.0000 mg | Freq: Four times a day (QID) | RECTAL | Status: DC | PRN

## 2023-04-25 MED ORDER — MIDAZOLAM HCL 2 MG/2ML IJ SOLN: 1.0000 mg | INTRAMUSCULAR | Status: DC | PRN

## 2023-04-25 MED ORDER — MIDAZOLAM BOLUS VIA INFUSION (WITHDRAWAL LIFE SUSTAINING TX): 2.0000 mg | INTRAVENOUS | Status: DC | PRN

## 2023-04-25 MED ORDER — GLYCOPYRROLATE 1 MG PO TABS: 1.0000 mg | ORAL_TABLET | ORAL | Status: DC | PRN

## 2023-04-25 MED ORDER — GLYCOPYRROLATE 0.2 MG/ML IJ SOLN: 0.2000 mg | INTRAMUSCULAR | Status: DC | PRN

## 2023-04-25 MED ORDER — HALOPERIDOL LACTATE 5 MG/ML IJ SOLN: 2.5000 mg | INTRAMUSCULAR | Status: DC | PRN

## 2023-04-25 MED ORDER — SODIUM CHLORIDE 3 % IN NEBU: 4.0000 mL | INHALATION_SOLUTION | Freq: Every day | RESPIRATORY_TRACT | Status: DC

## 2023-04-25 MED ORDER — DIPHENHYDRAMINE HCL 50 MG/ML IJ SOLN: 25.0000 mg | INTRAMUSCULAR | Status: DC | PRN

## 2023-04-25 MED ORDER — ONDANSETRON 4 MG PO TBDP: 4.0000 mg | ORAL_TABLET | Freq: Four times a day (QID) | ORAL | Status: DC | PRN

## 2023-04-25 MED ORDER — ONDANSETRON HCL 4 MG/2ML IJ SOLN: 4.0000 mg | Freq: Four times a day (QID) | INTRAMUSCULAR | Status: DC | PRN

## 2023-04-25 MED ORDER — DEXTROSE 50 % IV SOLN
25.0000 g | INTRAVENOUS | Status: AC
  Administered 2023-04-25: 25 g via INTRAVENOUS

## 2023-04-25 MED ORDER — PANTOPRAZOLE SODIUM 40 MG IV SOLR
40.0000 mg | Freq: Every day | INTRAVENOUS | Status: DC
  Administered 2023-04-25: 40 mg via INTRAVENOUS
  Filled 2023-04-25: qty 10

## 2023-04-25 MED ORDER — MORPHINE BOLUS VIA INFUSION: 5.0000 mg | INTRAVENOUS | Status: DC | PRN

## 2023-04-25 MED ORDER — ORAL CARE MOUTH RINSE: 15.0000 mL | OROMUCOSAL | Status: DC | PRN

## 2023-04-25 MED ORDER — DEXTROSE 50 % IV SOLN
12.5000 g | INTRAVENOUS | Status: AC
  Administered 2023-04-25: 12.5 g via INTRAVENOUS

## 2023-04-25 MED ORDER — POTASSIUM PHOSPHATES 15 MMOLE/5ML IV SOLN
30.0000 mmol | Freq: Once | INTRAVENOUS | Status: AC
  Administered 2023-04-25: 30 mmol via INTRAVENOUS
  Filled 2023-04-25: qty 10

## 2023-04-25 MED ORDER — SODIUM CHLORIDE 0.9 % IV SOLN
2.0000 g | INTRAVENOUS | Status: DC
  Administered 2023-04-25: 2 g via INTRAVENOUS
  Filled 2023-04-25: qty 20

## 2023-04-25 MED ORDER — MORPHINE 100MG IN NS 100ML (1MG/ML) PREMIX INFUSION
0.0000 mg/h | INTRAVENOUS | Status: DC
  Administered 2023-04-25: 5 mg/h via INTRAVENOUS
  Filled 2023-04-25: qty 100

## 2023-04-25 MED ORDER — POLYVINYL ALCOHOL 1.4 % OP SOLN: 1.0000 [drp] | Freq: Four times a day (QID) | OPHTHALMIC | Status: DC | PRN

## 2023-04-25 MED ORDER — MEDIHONEY WOUND/BURN DRESSING EX PSTE
1.0000 | PASTE | Freq: Every day | CUTANEOUS | Status: DC
  Administered 2023-04-25: 1 via TOPICAL
  Filled 2023-04-25: qty 44

## 2023-04-25 MED ORDER — ORAL CARE MOUTH RINSE
15.0000 mL | OROMUCOSAL | Status: DC
  Administered 2023-04-25 (×4): 15 mL via OROMUCOSAL

## 2023-04-25 NOTE — Progress Notes (Signed)
 OT Cancellation Note  Patient Details Name: [name] [name] MRN: [id number] DOB: 1950-10-06   Cancelled Treatment:    Reason Eval/Treat Not Completed:  (Pt with medical decline. Plan for meeting with family to determine next steps this afternoon.)   Evern Bio 04/25/2023, 10:06 AM Berna Spare, OTR/L Acute Rehabilitation Services Office: [phone]

## 2023-04-25 NOTE — Progress Notes (Signed)
 Afternoon ICU rounds:  No improvement in oxygen requirement or pressor requirement since this AM. Had GOC discussion with family and made DNR/DNI, no escalation. We are keeping with current treatments with no escalation until family can arrive tonight and in the AM before transitioning to comfort measures. Sister has visited most of the day. Will repeat BMP this evening to optimize her if able until family is okay with transitioning. Will keep D10 gtt on until ready for comfort as well as she is NPO.

## 2023-04-25 NOTE — Consult Note (Addendum)
 Palliative Care Consult Note                                  Date: 04/25/2023   Patient Name: [name] [name]  DOB: Jul 11, 1950  MRN: [id number]  Age / Sex: 73 y.o., female  PCP: Pcp, No Referring Physician: Steffanie Dunn, DO  Reason for Consultation: Establishing goals of care  HPI/Patient Profile: 73 y.o. female  with past medical history of HTN, HLD, STEMI s/p CABG, seizures, CVA, dementia, A-fib not on Eliquis.  She presented with AMS/sepsis.  She was admitted on 04/24/2023 with acute hypoxic respiratory failure due to aspiration, pneumonia, versus other; septic shock; Klebsiella bacteremia; AMS; history of dementia, CVA, seizures; and others.   Palliative medicine was consulted for GOC conversations.  Past Medical History:  Diagnosis Date   Acute ischemic left MCA stroke (HCC) 03/11/2017   Hyperlipidemia LDL goal < 70 07/24/2012   Hypertension    S/P CABG x 5 : LIMA-LAD, SVG-OM1-OM2, SVG-(Y)-DIAG-PDA. 07/25/12 07/28/2012   POSTOPERATIVE DIAGNOSIS: Severe 3-vessel coronary disease, status post  myocardial infarction.  PROCEDURE: Median sternotomy, extracorporeal circulation, coronary  artery bypass grafting x5 (left internal mammary artery to LAD,  sequential saphenous vein graft to obtuse marginal 1, obtuse marginal 2,  and posterior descending with Y graft to 1st diagonal), endoscopic vein  harvest, left thigh.     Seizures (HCC)    "years ago" (03/11/2017)   STEMI (ST elevation myocardial infarction), secondary to disease of RCA, with disease of LAD and diag. branch and chronic non dominant LCX occlusion for CABG  07/21/2012   Stroke [facility]) ?08/2012   "right sided weakness; speech isssues since" (03/11/2017)   Tobacco abuse 07/21/2012    Subjective:   This NP Wynne Dust reviewed medical records, received report from team, assessed the patient and then meet at the patient's bedside to discuss diagnosis, prognosis, GOC, EOL wishes disposition  and options.  Prior to going to the bedside I spoke with PCCM Dr. Chestine Spore and PCCM PA Mertha Baars for clinical update and plan for family meeting today.  I met with the patient at bedside, she is unable to meaningfully communicate.  Also present at the bedside was the patient's sister [name].  Mertha Baars, PA, myself, and the patient's sister [name] excused ourselves to a conference room.  [name] called the patient's brother [name] to participate in GOC conversations.   We meet to discuss diagnosis prognosis, GOC, EOL wishes, disposition and options. Concept of Palliative Care was introduced as specialized medical care for people and their families living with serious illness.  If focuses on providing relief from the symptoms and stress of a serious illness.  The goal is to improve quality of life for both the patient and the family. Values and goals of care important to patient and family were attempted to be elicited.  Created space and opportunity for patient  and family to explore thoughts and feelings regarding current medical situation   Natural trajectory and current clinical status were discussed. Questions and concerns addressed. Patient  encouraged to call with questions or concerns.    Patient/Family Understanding of Illness: PCCM provided a thorough and in-depth explanation of the patient's current clinical situation.  We know that she at baseline lives in a facility and is bedbound and at best speaks a couple words here and there.  Some point patient she became sick and came to the  ER where was found that she is septic and in septic shock.  Source of this infection is likely from multiple sources including multiple significant wounds, pneumonia.  We had further significant discussion about the patient's current clinical situation.  At the end of our conversation it was made clear, and family excepted, this this is felt to be a nonsurvivable situation.  Life Review: The patient  has had a lot of loss in the past 2 years with multiple family members passing away including her mother.  The patient currently lives at a facility, is bedbound, and due to past strokes at best only speaks couple words here and there.  Goals: After our significant discussion, as detailed above and below, family has elected for DNR/DNI, no escalation of care, anticipate transition to comfort care possibly tomorrow after family has been able to visit.  Today's Discussion: In addition to discussions described above we had extensive discussion on various topics.  Again, we detailed discussion of the patient's very serious clinical situation.  We described that she is on maximum amount of vasopressors allowed in a peripheral IV, requiring increasing oxygen needs (currently on heated high flow 50 L / 80%).  The patient does not appear to be getting better despite aggressive care that she has been receiving thus far.  Overall, independent of the critical care team, the patient is likely in a nonsurvivable situation.  They feel that further escalation of care such as intubation, CPR, central lines, other procedures would only cause discomfort in her final days rather than providing cure.  We talked a bit about her baseline status and the fact that she lives at facility, has been, at best only others a couple words here and there.  Overall when discussing her quality of life family states that they are very well aware that she is very limited and has a poor quality of life.  Given this they do not wish for her to suffer moving forward.  We then talked about medical team recommendations.  Given that we feel she is in a nonsurvivable situation we recommended changing CODE STATUS to DNR/DNI.  We had a significant discussion on the fact she was unlikely to survive any resuscitative efforts and this would only provide for suffering in her last moments.  After discussion, family is agreeable to transition to  DNR/DNI.  Next, we talked about if she is in a likely nonsurvivable situation, if we cannot cure her and cannot prevent her from passing, we should have a very thoughtful discussion about how to take care of her.  Family asked what the alternative to continued aggressive escalation of care would be.  At this point we discussed transition to comfort care. I explained comfort care as care where the patient would no longer receive aggressive medical interventions such as continuous vital signs, lab work, radiology testing, or medications not focused on comfort, peace, and dignity. This includes stopping antibiotics and weaning oxygen to room air, as these are generally not accepted as providing comfort but only prolonging the dying process artificially. All care would focus on how the patient is looking and feeling. This would include management of any symptoms that may cause discomfort, pain, shortness of breath/air hunger, increased work of breathing, cough, nausea, agitation/restlessness, anxiety, and/or secretions etc. Symptoms would be managed with medications and other non-pharmacological interventions such as spiritual support if requested, repositioning, music therapy, or therapeutic listening. Family verbalized understanding and agreement.  After discussion back-and-forth we have decided on no escalation  of care, continue current scope of care to allow today and normal morning for family to come visit.  Family anticipates likely transition to comfort care tomorrow after family has been able to come visit and say goodbye.  I offered chaplaincy support with a spiritual care consult.  Family states that they will request her personal pastor to come visit today.  I offered that a onsite chaplain is available at any point they simply need to asked the nurse to page the chaplain on-call.  They expressed appreciation.  I shared the palliative medicine would follow-up tomorrow.  I shared that once a transition  to comfort care palliative medicine will continue to see daily for ongoing symptom management and comfort care. I provided emotional and general support through therapeutic listening, empathy, sharing of stories, and other techniques. I answered all questions and addressed all concerns to the best of my ability.  After goals of care conversation I debriefed with the medical team and bedside nurse.  Review of Systems  Unable to perform ROS   Objective:   Primary Diagnoses: Present on Admission:  Sepsis [facility])   Physical Exam Vitals and nursing note reviewed.  Constitutional:      General: She is sleeping. She is not in acute distress. HENT:     Head: Normocephalic and atraumatic.  Cardiovascular:     Rate and Rhythm: Tachycardia present.  Abdominal:     General: Abdomen is flat.  Neurological:     Mental Status: She is unresponsive.     Vital Signs:  BP 93/74 (BP Location: Right Arm)   Pulse (!) 104   Temp 99.5 F (37.5 C) (Axillary)   Resp (!) 25   Wt 36.1 kg   SpO2 98%   BMI 15.54 kg/m   Palliative Assessment/Data: 10%    Advanced Care Planning:   Existing Vynca/ACP Documentation: None  Primary Decision Maker: NEXT OF KIN  Code Status/Advance Care Planning: DNR-Limited  A discussion was had today regarding advanced directives. Concepts specific to code status, artifical feeding and hydration, continued IV antibiotics and rehospitalization was had.  The difference between a aggressive medical intervention path and a palliative comfort care path for this patient at this time was had.   Decisions/Changes to ACP: Change CODE STATUS to DNR-limited  Assessment & Plan:   Impression: 73 year old female with acute presentation chronic comorbidities as described above.  The patient is in a very [place] clinical situation with respiratory failure and progressive oxygen needs, sepsis/shock, multiple wounds and pneumonia.  Family understands that she is likely in a  nonsurvivable situation.  After very thoughtful and extensive discussion we have elected to change CODE STATUS to DNR-limited (DNR/DNI), continue current scope of care today, no escalation of care, anticipate transition to comfort care likely tomorrow after family has been to visit.  Overall long-term prognosis very poor.  SUMMARY OF RECOMMENDATIONS   Changed to DNR-limited Continue current scope of care through today Anticipate multiple family members visiting over the next 24 hours Anticipate likely transition to comfort care tomorrow If the patient clinically destabilizes and passes before then, we will allow a natural death Ongoing emotional and spiritual support of the patient and family Palliative medicine will follow-up tomorrow  Symptom Management:  Per primary team PMT is available to assist as needed  Prognosis:  Hours - Days  Discharge Planning:  Anticipated Hospital Death   Discussed with: Patient's family, medical team, nursing team    Thank you for allowing [place] to participate in the  care of JARELI HIGHLAND PMT will continue to support holistically.  Time Total: 66 min  Detailed review of medical records (labs, imaging, vital signs), medically appropriate exam, discussed with treatment team, counseling and education to patient, family, & staff, documenting clinical information, medication management, coordination of care  Signed by: Wynne Dust, NP Palliative Medicine Team  Team Phone # [phone] (Nights/Weekends)  04/25/2023, 12:26 PM

## 2023-04-25 NOTE — Consult Note (Signed)
 WOC Nurse Consult Note: Reason for Consult: multiple pressure injuries to sacrum/B ankles  Wound type: 1. Stage 4 sacral pressure injury  2.  Stage 4 R posterior lower leg (achilles area); Unstageable L posterior lower leg achilles region  3.  R medial foot Stage Pressure Injury  4. Stage 3 L lateral thigh  5.  Full thickness wounds R toes likely r/t arterial insufficiency  6.  Unstageable PI L medial foot; unstageable L medial ankle  7.  Stage 4 L dorsal foot  Pressure Injury POA: Yes Measurement: see nursing flowsheet  Wound bed: 1.  Stage 4 Sacrum what can be visualized 50% pink moist 50% black purple hemorrhagic tissue  2.  Stage 4 R posterior lower leg pink moist with tendon exposure; L posterior lower leg (achilles) 100% black eschar  3.  R medial foot 60% pink moist 20% black hemorrhagic tissue 20% yellow  4.  L lateral thigh 50% pink moist 50% yellow  5.  R toes pink dry  6.  L medial foot/ankle eschar; L dorsal foot pink dry with tendon exposure  Drainage (amount, consistency, odor) per nursing flowsheet; sacral wound appears to moderate tan exudate on old dressing  Periwound:pink epithelium surrounding sacral wound  Dressing procedure/placement/frequency:  Cleanse sacral wound with Vashe wound cleanser Hart [place] [phone]), do not rinse and allow to air dry. Using a Q tip applicator apply Dakin's moistened gauze to sacral wound x 3 days making sure to cover entire wound bed.  Cover with dry gauze and silicone foam or ABD pad whichever is preferred.  Cleanse R medial foot (bunion area) and L lateral thigh wounds with Vashe wound cleanser, do not rinse and allow to air dry.  Apply Medihoney to wound beds daily, cover with dry gauze and silicone foam.  Cleanse B achilles area (posterior lower leg) wounds, L medial foot/ankle, and L dorsal foot wounds with Vashe wound cleanser Hart Rochester [phone]) do not rinse and allow to air dry.  Apply Xeroform gauze Hart Rochester [phone]) to wound beds daily.  Cover  with dry gauze and silicone foam or Kerlix roll gauze.  Bilateral feet should be placed in Prevalon boots to offload pressure.  Paint R toe wounds with Betadine daily and allow to air dry.    After 3 days of Dakins to sacral wound will switch over to Vashe wet to dry dressings daily.    If aggressive measures are being pursued in this patient would recommend ortho evaluation of B lower feet/legs due to what appears to be tendon exposure.     POC discussed with bedside nurse.  Patient should remain on a low air loss mattress throughout hospitalization for pressure redistribution and moisture management.   Thank you,    Priscella Mann MSN, RN-BC, [employer] [phone]

## 2023-04-25 NOTE — Progress Notes (Signed)
 PHARMACY - PHYSICIAN COMMUNICATION CRITICAL VALUE ALERT - BLOOD CULTURE IDENTIFICATION (BCID)  [name] [name] is an 73 y.o. female who presented to [facility] on 04/24/2023 with a chief complaint of PNA  Assessment:   Blood cultures growing Klebsiella pneumoniae  Name of physician (or Provider) Contacted:  CCM rounding MD   Current antibiotics:  Vancomycin and Zosyn   Changes to prescribed antibiotics recommended:  Change antibiotics to Rocephin 2 g IV q24h  Results for orders placed or performed during the hospital encounter of 04/24/23  Blood Culture ID Panel (Reflexed) (Collected: 04/24/2023  5:56 PM)  Result Value Ref Range   Enterococcus faecalis NOT DETECTED NOT DETECTED   Enterococcus Faecium NOT DETECTED NOT DETECTED   Listeria monocytogenes NOT DETECTED NOT DETECTED   Staphylococcus species NOT DETECTED NOT DETECTED   Staphylococcus aureus (BCID) NOT DETECTED NOT DETECTED   Staphylococcus epidermidis NOT DETECTED NOT DETECTED   Staphylococcus lugdunensis NOT DETECTED NOT DETECTED   Streptococcus species NOT DETECTED NOT DETECTED   Streptococcus agalactiae NOT DETECTED NOT DETECTED   Streptococcus pneumoniae NOT DETECTED NOT DETECTED   Streptococcus pyogenes NOT DETECTED NOT DETECTED   A.calcoaceticus-baumannii NOT DETECTED NOT DETECTED   Bacteroides fragilis NOT DETECTED NOT DETECTED   Enterobacterales DETECTED (A) NOT DETECTED   Enterobacter cloacae complex NOT DETECTED NOT DETECTED   Escherichia coli NOT DETECTED NOT DETECTED   Klebsiella aerogenes NOT DETECTED NOT DETECTED   Klebsiella oxytoca NOT DETECTED NOT DETECTED   Klebsiella pneumoniae DETECTED (A) NOT DETECTED   Proteus species NOT DETECTED NOT DETECTED   Salmonella species NOT DETECTED NOT DETECTED   Serratia marcescens NOT DETECTED NOT DETECTED   Haemophilus influenzae NOT DETECTED NOT DETECTED   Neisseria meningitidis NOT DETECTED NOT DETECTED   Pseudomonas aeruginosa NOT DETECTED NOT DETECTED    Stenotrophomonas maltophilia NOT DETECTED NOT DETECTED   Candida albicans NOT DETECTED NOT DETECTED   Candida auris NOT DETECTED NOT DETECTED   Candida glabrata NOT DETECTED NOT DETECTED   Candida krusei NOT DETECTED NOT DETECTED   Candida parapsilosis NOT DETECTED NOT DETECTED   Candida tropicalis NOT DETECTED NOT DETECTED   Cryptococcus neoformans/gattii NOT DETECTED NOT DETECTED   CTX-M ESBL NOT DETECTED NOT DETECTED   Carbapenem resistance IMP NOT DETECTED NOT DETECTED   Carbapenem resistance KPC NOT DETECTED NOT DETECTED   Carbapenem resistance NDM NOT DETECTED NOT DETECTED   Carbapenem resist OXA 48 LIKE NOT DETECTED NOT DETECTED   Carbapenem resistance VIM NOT DETECTED NOT DETECTED    Eddie Candle 04/25/2023  7:19 AM

## 2023-04-25 NOTE — Progress Notes (Signed)
 Unable to do EEG due to nurse redressing wounds. Will attempt later when schedule permits.

## 2023-04-25 NOTE — TOC Initial Note (Signed)
 Transition of Care [facility]) - Initial/Assessment Note    [name]: [id number] Date of Birth: Dec 02, 1950  [facility] CM/SW Contact:    Elliot Cousin, RN Phone Number: [phone] 04/25/2023, 12:10 PM  Clinical Narrative:                 TOC CM reviewed chart and plan is for comfort care. Plan is for family to visit today and tomorrow am.     Barriers to Discharge: Continued Medical Work up   Patient Goals and CMS Choice            Expected Discharge Plan and Services       Living arrangements for the past 2 months: Single Family Home                                      Prior Living Arrangements/Services Living arrangements for the past 2 months: Single Family Home Lives with:: Relatives                   Activities of Daily Living   ADL Screening (condition at time of admission) Independently performs ADLs?: No Does the patient have a NEW difficulty with bathing/dressing/toileting/self-feeding that is expected to last >3 days?: No Does the patient have a NEW difficulty with getting in/out of bed, walking, or climbing stairs that is expected to last >3 days?: No Does the patient have a NEW difficulty with communication that is expected to last >3 days?: No Is the patient deaf or have difficulty hearing?:  (UTA) Does the patient have difficulty seeing, even when wearing glasses/contacts?:  (UTA) Does the patient have difficulty concentrating, remembering, or making decisions?:  (UTA)  Permission Sought/Granted                  Emotional Assessment              Admission diagnosis:  Hypoglycemia [E16.2] NSTEMI (non-ST elevated myocardial infarction) (HCC) [I21.4] AKI (acute kidney injury) (HCC) [N17.9] Sepsis (HCC) [A41.9] Sepsis with acute organ dysfunction and septic shock, due to unspecified organism, unspecified organ dysfunction type (HCC) [A41.9, R65.21] Patient Active Problem  List   Diagnosis Date Noted   Sepsis (HCC) 04/24/2023   Seizures (HCC) 08/06/2022   Pressure injury of skin 08/06/2022   ICH (intracerebral hemorrhage) (HCC) 07/23/2022   Stroke (HCC) 06/22/2022   HLD (hyperlipidemia) 06/22/2022   Protein-calorie malnutrition, severe (HCC) 06/22/2022   CAD (coronary artery disease) 06/22/2022   Hypoglycemia 06/22/2022   Heart failure with mildly reduced ejection fraction (HFmrEF) (HCC) 11/05/2021   COVID-19 11/04/2021   Dehydration 11/04/2021   Syncope 11/04/2021   Atrial fibrillation with RVR (HCC) 11/03/2021   Chronic anticoagulation 04/24/2019   Closed displaced fracture of proximal phalanx of right little finger    Dyslipidemia, goal LDL below 70    History of stroke    Hypokalemia    History of ST elevation myocardial infarction (STEMI)    History of seizure    History of memory loss    PAF (paroxysmal atrial fibrillation) (HCC) 10/19/2013   Constipation 10/04/2013   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 07/25/12 07/28/2012   Essential hypertension 07/21/2012   Tobacco abuse 07/21/2012  PCP:  Pcp, No Pharmacy:   [facility] - [place], [place] - [address] [place] [address] Phone: [phone] Fax: [phone]  [facility] [address] [place] [address] Phone: [phone] Fax: [phone]     Social Drivers of Health (SDOH) Social History: SDOH Screenings   Food Insecurity: Patient Unable To Answer (04/24/2023)  Housing: Patient Unable To Answer (04/24/2023)  Transportation Needs: Patient Unable To Answer (04/24/2023)  Utilities: Patient Unable To Answer (04/24/2023)  Social Connections: Unknown (04/24/2023)  Tobacco Use: High Risk (04/24/2023)   SDOH Interventions:     Readmission Risk Interventions      No data to display

## 2023-04-25 NOTE — Progress Notes (Signed)
 Hypoglycemic Event 01:54  CBG: 37  Treatment: D50 50 mL (25 gm)  Symptoms: None  Follow-up CBG: Time:0222 CBG Result:127  Possible Reasons for Event: Unknown  Comments/MD notified:Dr.Paliwal made aware- See new orders      CBG: 66  Treatment: D50 25 mL (12.5 gm)  Symptoms: None  Follow-up CBG: Time: 136 CBG Result:133  Possible Reasons for Event: Unknown  Comments/MD notified:Dr.Paliwal made aware- See new orders

## 2023-04-25 NOTE — IPAL (Signed)
  Interdisciplinary Goals of Care Family Meeting   Date carried out:: 04/25/2023  Location of the meeting: Bedside  Member's involved: Physician, Bedside Registered Nurse, and Family Member or next of kin  Durable Power of Attorney or acting medical decision maker: multiple family members an Education officer, environmental at bedside    Discussion: We discussed goals of care for [name] [name] .  Family has all been at bedside this evening praying. They are ready to transition to focusing on her comfort and deescalating aggressive measures. Comfort orders placed. RN present during discussion.   Code status: Full DNR  Disposition: In-patient comfort care   Time spent for the meeting: 5 min.  Steffanie Dunn 04/25/2023, 7:11 PM

## 2023-04-25 NOTE — Progress Notes (Signed)
 NAME:  [name] [name], MRN:  [id number], DOB:  11-Jul-1950, LOS: 1 ADMISSION DATE:  04/24/2023, CONSULTATION DATE:  3/23 REFERRING MD:  Dr. Lynelle Doctor, CHIEF COMPLAINT:  sepsis   History of Present Illness:  Patient is a 73 year old female with pertinent PMH HTN, HLD, STEMI s/p CABG, seizures, CVA, dementia, A-fib not on Eliquis presents to [facility] ED on 3/23 with AMS/sepsis.  Patient has had multiple strokes.  Most recently admitted on 07/23/2022 with stroke with hemorrhagic conversion.  Patient was on Eliquis which was stopped.  Discharged on 08/06/2022.  Patient now resides at nursing home.  At baseline patient minimally responsive.  Patient is contracted and bedbound at baseline.   On 3/23 patient having increased AMS.  EMS arrived and patient febrile 101 F and hypotensive SBP 60s to 70s.  Given fluids.  In ED patient nonverbal.  Eyes open to voice but does not respond to verbal stimuli.  BP improved with IV fluids.  Unable to obtain O2 sats and ABG showing PaO2 52.  Patient placed on NRB.  CXR with some atelectasis versus infiltrate on left lung personally reviewed.  Patient febrile 101 F and WBC 8.  Cultures obtained and patient placed on broad-spectrum antibiotics.  UA pending.  LA 3.4.  COVID/flu/RSV negative.  Troponin 236.  EKG with no ST elevation; QTc 562.  Glucose 54 given dextrose.  CT head, chest, abdomen, pelvis pending.  PCCM consulted for ICU admission.  Pertinent ED labs: Creat 1.52, albumin 1.8, AST 101, ALT 73, Hgb 10.4, troponin 236  Pertinent  Medical History   Past Medical History:  Diagnosis Date   Acute ischemic left MCA stroke (HCC) 03/11/2017   Hyperlipidemia LDL goal < 70 07/24/2012   Hypertension    S/P CABG x 5 : LIMA-LAD, SVG-OM1-OM2, SVG-(Y)-DIAG-PDA. 07/25/12 07/28/2012   POSTOPERATIVE DIAGNOSIS: Severe 3-vessel coronary disease, status post  myocardial infarction.  PROCEDURE: Median sternotomy, extracorporeal circulation, coronary  artery bypass grafting x5 (left internal  mammary artery to LAD,  sequential saphenous vein graft to obtuse marginal 1, obtuse marginal 2,  and posterior descending with Y graft to 1st diagonal), endoscopic vein  harvest, left thigh.     Seizures (HCC)    "years ago" (03/11/2017)   STEMI (ST elevation myocardial infarction), secondary to disease of RCA, with disease of LAD and diag. branch and chronic non dominant LCX occlusion for CABG  07/21/2012   Stroke [name]) ?08/2012   "right sided weakness; speech isssues since" (03/11/2017)   Tobacco abuse 07/21/2012     Significant Hospital Events: Including procedures, antibiotic start and stop dates in addition to other pertinent events   3/23: admitted to [facility] w/ ams and hypotension/sepsis. 3/24: high oxygen requirements, on 9 levo, several stage IV wounds. Working on [employer] today  Interim History / Subjective:  Patient unable to participate in subjective portion of the exam   Objective   Blood pressure 136/86, pulse (!) 116, temperature 99.1 F (37.3 C), temperature source Axillary, resp. rate (!) 27, weight 36.1 kg, SpO2 99%.    FiO2 (%):  [70 %-100 %] 70 %   Intake/Output Summary (Last 24 hours) at 04/25/2023 0723 Last data filed at 04/25/2023 0600 Gross per 24 hour  Intake 2289.77 ml  Output --  Net 2289.77 ml   Filed Weights   04/24/23 2100 04/24/23 2125 04/25/23 0500  Weight: 36.1 kg 36.1 kg 36.1 kg    Examination: General: elderly, acute on chronically ill appearing, cachectic, mild increased WOB HEENT: resists opening eye lids,  pupils sluggish, mucous membranes dry, hhfnc  Neuro: resists eye opening, contracted, perrla, does not follow commands CV: s1s2, sinus tachycardia, no appreciable murmur, rub, gallop  PULM: mild increased WOB, RR 20s, diminished air movement bilaterally, hhfnc  GI: soft, bsx4 active  Extremities: warm/dry Skin: see nursing wound documentation, multiple wounds including stage IV decub, exposed achilles tendon, multiple wounds to bilateral feet     Resolved Hospital Problem list    Assessment & Plan:  Acute hypoxic respiratory failure 2/2 pneumonia, aspiration vs other  Increased WOB. Left sided opacities concerning for pneumonia vs aspiration pneumonia. Do not feel she is mucous plugging as changes on CT present from before. Blood cultures + K. Pnemonia. Unclear if this is coming from wounds or lung. Requiring HHFNC 50L 80%. Resp panel and RVP 20 pathogen both negative. MRSA PCR negative - tracheal aspirate when able  - blood cultures with k.pneumonia, will switch to rocephin coverage alone  - con't HHFNC for sat >92% - NT suction PRN, aggressive pulm toileting  - NPO  - high risk intubation - ongoing GOC  - f/u urine legionella/strep  - brovana, pulmicort, yupelri, prn nebs, HTS nebs prn   Septic shock  Klebsiella pneumonia bacteremia  Likely multifactorial sources including multiple wounds to sacrum, exposed achilles tendon and evidence of left pneumonia. BC+ k.pna pan sensitive. Still pending urine culture. She had a lodged gallstone on RUQ - no significant stranding seen  - rocephin coverage  - levophed for MAP goal >65 - trend lactic, wbc, fever curve  - f/u urine studies, trend LFTs  - tracheal aspirate when able   Acute metabolic encephalopathy Hx of stroke and seizures Hx of dementia Per sister says a few words at baseline. Currently nonverbal, not following commands. Likely septic encephalopathy. Ammonia normal. Hypoglycemic in ED requiring D10 gtt. Ct head negative for acute findings  - rocephin for sepsis coverage  - con't d10 gtt while npo  - spot eeg given history of seizures  - con't home keppra  - neuro checks, delirium precautions, limit sedating medications   Hypoglycemia - con't d10 gtt while npo  - cbg q4h   Elevated troponin Prolonged QTc QTc 562. Troponin 236. Likely demand ischemia.  - monitor Qtc  - avoid qtc prolonging medications   Acute kidney injury, improving  Presenting sCr 1.52 >  1.17. likely septic ATN vs hypoperfusion. Fluid resuscitated based on weight. Ongoing oliguria.  - trend bmp, mag, phos - replete elytes - strict I&O - need UA, culture  - avoid nephrotoxic agents, renally dose medications - ensure adequate renal perfusion   Hx of atrial fibrillation, not on anticoagulation  Hypertension  Hyperlipidemia  Hx of STEMI s/p CABG Hypotensive on arrival with septic shock. Goes in and out of atrial fibrillation.  - hold home amiodarone and metoprolol in setting of shock, in sinus rhythm  - NPO for now pending GOC - con't to hold asa and statin, setia  - not on AC from recent hemorrhagic stroke  - con't tele monitoring   Elevated LFTs Shock liver vs hepatic etiology. RUQ ultrasound with lodged gallstone with no obvious cholecystitis. Bilirubin not significantly elevated. Cannot rely on murphys sign with mental status.  - trend LFTs  - if not improving on current antibiotics can re-image, but with multiple system dysfunction right now will con't with GOC.   Normocytic anemia No obvious source of bleeding. May be nutrition related  - trend   Stage IV sacral wound, POA  Multiple pressure  ulcers to bilateral ankles and feet including exposed achilles tendon, POA - wound consulted, appreciate recommendations  - rocephin for now   Protein calorie malnutrition Dysphagia - RD consulted, appreciate recs  - hold cortrak until GOC   Best Practice (right click and "Reselect all SmartList Selections" daily)   Diet/type: NPO DVT prophylaxis SCD Pressure ulcer(s): present on admission  GI prophylaxis: PPI Lines: N/A Foley:  N/A Code Status:  full code Last date of multidisciplinary goals of care discussion: spoke with sister 3/24 AM who is coming at 49 AM to discuss GOC further.   Labs   CBC: Recent Labs  Lab 04/24/23 1756 04/24/23 1759 04/24/23 2106 04/25/23 0319 04/25/23 0640  WBC 8.0  --   --  2.5* 5.6  NEUTROABS 7.5  --   --   --   --   HGB  10.4* 9.2* 8.2* 2.8* 9.5*  HCT 33.1* 27.0* 24.0* 9.3* 28.9*  MCV 87.8  --   --  92.1 84.3  PLT 347  --   --  76* 229    Basic Metabolic Panel: Recent Labs  Lab 04/24/23 1756 04/24/23 1759 04/24/23 2106 04/24/23 2221  NA 143 146* 146* 143  K 4.3 2.8* 2.7* 2.9*  CL 112*  --   --  114*  CO2 20*  --   --  16*  GLUCOSE 54*  --   --  104*  BUN 39*  --   --  35*  CREATININE 1.52*  --   --  1.35*  CALCIUM 8.0*  --   --  7.6*  MG 1.9  --   --  1.6*  PHOS  --   --   --  2.7   GFR: Estimated Creatinine Clearance: 20.5 mL/min (A) (by C-G formula based on SCr of 1.35 mg/dL (H)). Recent Labs  Lab 04/24/23 1756 04/24/23 1806 04/24/23 2221 04/25/23 0028 04/25/23 0319 04/25/23 0640  PROCALCITON  --   --  2.29  --   --   --   WBC 8.0  --   --   --  2.5* 5.6  LATICACIDVEN  --  3.4* 5.2* 5.8*  --   --     Liver Function Tests: Recent Labs  Lab 04/24/23 1756  AST 101*  ALT 73*  ALKPHOS 46  BILITOT 1.8*  PROT 6.0*  ALBUMIN 1.8*   No results for input(s): "LIPASE", "AMYLASE" in the last 168 hours. Recent Labs  Lab 04/24/23 2221  AMMONIA 22    ABG    Component Value Date/Time   PHART 7.384 04/24/2023 2106   PCO2ART 27.4 (L) 04/24/2023 2106   PO2ART 124 (H) 04/24/2023 2106   HCO3 16.4 (L) 04/24/2023 2106   TCO2 17 (L) 04/24/2023 2106   ACIDBASEDEF 8.0 (H) 04/24/2023 2106   O2SAT 99 04/24/2023 2106     Coagulation Profile: Recent Labs  Lab 04/24/23 1756  INR 1.2    Cardiac Enzymes: No results for input(s): "CKTOTAL", "CKMB", "CKMBINDEX", "TROPONINI" in the last 168 hours.  HbA1C: Hgb A1c MFr Bld  Date/Time Value Ref Range Status  04/24/2023 10:21 PM 5.2 4.8 - 5.6 % Final    Comment:    (NOTE) Pre diabetes:          5.7%-6.4%  Diabetes:              >6.4%  Glycemic control for   <7.0% adults with diabetes   07/24/2022 04:53 AM 4.9 4.8 - 5.6 % Final    Comment:    (  NOTE) Pre diabetes:          5.7%-6.4%  Diabetes:              >6.4%  Glycemic  control for   <7.0% adults with diabetes     CBG: Recent Labs  Lab 04/24/23 2304 04/25/23 0154 04/25/23 0222 04/25/23 0354 04/25/23 0426  GLUCAP 82 37* 127* 66* 133*    Review of Systems:   As above  Past Medical History:  She,  has a past medical history of Acute ischemic left MCA stroke (HCC) (03/11/2017), Hyperlipidemia LDL goal < 70 (07/24/2012), Hypertension, S/P CABG x 5 : LIMA-LAD, SVG-OM1-OM2, SVG-(Y)-DIAG-PDA. 07/25/12 (07/28/2012), Seizures (HCC), STEMI (ST elevation myocardial infarction), secondary to disease of RCA, with disease of LAD and diag. branch and chronic non dominant LCX occlusion for CABG  (07/21/2012), Stroke (HCC) (?08/2012), and Tobacco abuse (07/21/2012).   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   LEFT HEART CATHETERIZATION WITH CORONARY ANGIOGRAM N/A 07/21/2012   Procedure: LEFT HEART CATHETERIZATION WITH CORONARY ANGIOGRAM;  Surgeon: [name], MD;  Location: [facility] CATH LAB;  Service: Cardiovascular;  Laterality: N/A;   LOOP RECORDER IMPLANT  08-15-2012   Medtronic LinQ implanted by Dr Johney Frame for cryptogenic stroke   TEE WITHOUT CARDIOVERSION N/A 08/15/2012   Procedure: TRANSESOPHAGEAL ECHOCARDIOGRAM (TEE);  Surgeon: Wendall Stade, MD;  Location: [facility] ENDOSCOPY;  Service: Cardiovascular;  Laterality: N/A;   TUBAL LIGATION       Social History:   reports that she has been smoking cigarettes. She started smoking about 10 years ago. She has a 51 pack-year smoking history. She has never used smokeless tobacco. She  reports that she does not drink alcohol and does not use drugs.   Family History:  Her family history includes CAD in her brother; Hypertension in her mother; Stroke in her brother.   Allergies No Known Allergies   Home Medications  Prior to Admission medications   Medication Sig Start Date End Date Taking? Authorizing Provider  acetaminophen (TYLENOL) 325 MG tablet Take 2 tablets (650 mg total) by mouth every 4 (four) hours as needed for mild pain (or temp > 37.5 C (99.5 F)). 08/06/22   de Saintclair Halsted, Cortney E, NP  amiodarone (PACERONE) 400 MG tablet Take 1 tablet (400 mg total) by mouth daily. 08/07/22   de Saintclair Halsted, Cortney E, NP  aspirin 81 MG chewable tablet Chew 1 tablet (81 mg total) by mouth daily. 08/07/22   de Saintclair Halsted, Cortney E, NP  atorvastatin (LIPITOR) 40 MG tablet Take 1 tablet (40 mg total) by mouth daily. 08/07/22   de Saintclair Halsted, Cortney E, NP  donepezil (ARICEPT) 5 MG tablet Take 1 tablet (5 mg total) by mouth at bedtime. 03/21/17   Angiulli, Mcarthur Rossetti, PA-C  ezetimibe (ZETIA) 10 MG tablet Take 1 tablet (10 mg total) by mouth daily. 06/25/22 12/22/22  Gillis Santa, MD  levETIRAcetam (KEPPRA) 500 MG tablet Take 1 tablet (500 mg total) by mouth 2 (two) times daily.  08/06/22   de Saintclair Halsted, Cortney E, NP  metoprolol tartrate (LOPRESSOR) 25 MG tablet Take 1 tablet (25 mg total) by mouth 2 (two) times daily. 08/06/22   de Saintclair Halsted, Cortney E, NP  polyethylene glycol (MIRALAX / GLYCOLAX) 17 g packet Take 17 g by mouth daily as needed for mild constipation, moderate constipation or severe constipation. 08/06/22   de Saintclair Halsted, Cortney E, NP  senna-docusate (SENOKOT-S) 8.6-50 MG tablet Take 1 tablet by mouth 2 (two) times daily. 08/06/22   de Verdis Prime, NP     Critical care time: 45 minutes     Cristopher Peru, PA-C [facility] 04/25/23 8:51 AM  Please see [email or web address] for pager details.  From 7A-7P if no response, please call [phone] After hours, please call  ELink [phone]

## 2023-04-25 NOTE — IPAL (Signed)
  Interdisciplinary Goals of Care Family Meeting   Date carried out: 04/25/2023  Location of the meeting: Conference room  Member's involved: Family Member or next of kin and Palliative care team member  Durable Power of Attorney or acting medical decision maker: [name] [name], sister     Discussion: We discussed goals of care for [name] [name]. Present for meeting was myself, Wynne Dust DNP with Palliative Care, [name] [name] (patient's sister and HCPOA) and on the phone [name] [name], patient's brother. We discussed where [name] is at baseline, and now with septic shock, multiorgan failure and that this is not a survivable illness for her. We discussed that aggressive measures like CPR, intubation, CVC, a-line will likely be prolonging suffering and will not change the outcome. They were very understandable. They are grieving multiple family losses including their other sister at the end of last year. They understand patient is at end of life. They are agreeable that we should not perform CPR or intubate [name]. They are also agreeable that we should not place invasive lines. We discussed no escalation of care meaning not increasing vasopressor requirement from where it is currently and they are in agreement with that knowing that she could decline at any time and at that time we should transition to comfort care. [name] [name], brother, would like to gather some family including patient's daughter, to come visit here tonight or early AM prior to transitioning to full comfort measures. They understand she may pass before that even with current interventions. Will change code status to DNR/DNI, NO ESCALATION. Chaplain was offered. They are calling patient's pastor to come visit.   Code status:   Code Status: Limited: Do not attempt resuscitation (DNR) -DNR-LIMITED -Do Not Intubate/DNI    Disposition: Continue current acute care  Time spent for the meeting: 20  Cristopher Peru, PA-C [facility] 04/25/23 11:40 AM  Please see [email or web address] for pager details.  From 7A-7P if no response, please call [phone] After hours, please call ELink [phone]

## 2023-04-25 NOTE — Progress Notes (Signed)
 PT Cancellation Note  Patient Details Name: [name] [name] MRN: [id number] DOB: 04-Dec-1950   Cancelled Treatment:    Reason Eval/Treat Not Completed: Medical issues which prohibited therapy. Pt with progressive decline in medical condition. Family meeting planned at 11 to discuss medical plan vs comfort care. PT signing off as pt not appropriate or in need of acute PT services. Please re-consult if needed in future.  [name], PT, DPT Acute Rehabilitation Services Secure chat preferred Office #: [phone]    [name] 04/25/2023, 8:40 AM

## 2023-04-26 ENCOUNTER — Other Ambulatory Visit: Payer: Self-pay

## 2023-04-27 LAB — CULTURE, BLOOD (ROUTINE X 2)

## 2023-04-28 ENCOUNTER — Encounter: Admitting: Vascular Surgery

## 2023-04-28 ENCOUNTER — Encounter (HOSPITAL_COMMUNITY)

## 2023-04-29 LAB — CULTURE, BLOOD (ROUTINE X 2): Culture: NO GROWTH

## 2023-05-03 ENCOUNTER — Encounter: Payer: Self-pay | Admitting: Critical Care Medicine

## 2023-05-03 NOTE — Death Summary Note (Signed)
 DEATH SUMMARY   [name]: [id number] DOB: 07-01-1950  Admission/Discharge Information   Admit Date:  2023-05-07  Date of Death: Date of Death: May 09, 2023  Time of Death: Time of Death: 0249  Length of Stay: 2  Referring Physician: Pcp, No   Reason(s) for Hospitalization  Septic shock  Diagnoses  Preliminary cause of death: septic shock  Secondary Diagnoses (including complications and co-morbidities):  Principal Problem:   Sepsis (HCC) Klebsiella pneumoniae bacteremia Septic shock due to L lung, RUL pneumonia- presumed K pneumoniae Lactic acidosis Acute respiratory failure with hypoxia due to multilobar pneumonia Acute metabolic encephalopathy due to sepsis Baseline dementia, h/o CVA with severe contractures History of seizures Cachexia, severe protein energy malnutrition Chronic anemia AKI Hypokalemia Troponin elevation due to critical illness, low suspicion for ACS Elevated transaminases and hyperbilirubinemia due to sepsis Severe disability Hypoglycemia Prolonged Qtc Elevated troponin due to critical illness H/o Afib, not on AC HTN HLD CAD w/ h/o STEMI Elevated LFTs due to sepsis Normocytic anemia dysphagia Wounds-- all POA Coccyx, stage 2 Medial sacrum unstagable Ankle  left anterior stage 3 Ankle left medial unstagable Ankle left posterior unstagable Left foot unstagable R toe unstagable Left anterior thigh stage 2 R Foot anterior stage 4 Perineum DTI Ankle posterior R- unstageable    Brief Hospital Course (including significant findings, care, treatment, and services provided and events leading to death)  [name] [name] is a 73 y.o. year old female with PMH HTN, HLD, STEMI s/p CABG, seizures, CVA, dementia, A-fib not on Eliquis presents to [facility] ED on 2023/05/07 with AMS/sepsis.   Patient has had multiple strokes.  Most recently admitted on 07/23/2022 with stroke with hemorrhagic conversion.  Patient was on Eliquis which  was stopped.  Discharged on 08/06/2022.  Patient now resides at nursing home.  At baseline patient minimally responsive.  Patient is contracted and bedbound at baseline.    On May 07, 2023 patient having increased AMS.  EMS arrived and patient febrile 101 F and hypotensive SBP 60s to 70s.  Given fluids.  In ED patient nonverbal.  Eyes open to voice but does not respond to verbal stimuli.  BP improved with IV fluids.  Unable to obtain O2 sats and ABG showing PaO2 52.  Patient placed on NRB.  CXR with some atelectasis versus infiltrate on left lung personally reviewed.  Patient febrile 101 F and WBC 8.  Cultures obtained and patient placed on broad-spectrum antibiotics.  UA pending.  LA 3.4.  COVID/flu/RSV negative.  Troponin 236.  EKG with no ST elevation; QTc 562.  Glucose 54 given dextrose.  CT head, chest, abdomen, pelvis pending.  PCCM consulted for ICU admission.   Pertinent ED labs: Creat 1.52, albumin 1.8, AST 101, ALT 73, Hgb 10.4, troponin 236  Once admitted she was started on pressors, antibiotics, had escalating oxygen requirements. Family discussion revealed that they wanted to prioritize comfort given her very poor long-term prognosis.    Pertinent Labs and Studies  Significant Diagnostic Studies US Abdomen Limited RUQ (LIVER/GB) Result Date: May 07, 2023 CLINICAL DATA:  Elevated LFTs EXAM: ULTRASOUND ABDOMEN LIMITED RIGHT UPPER QUADRANT COMPARISON:  CT from earlier in the same day. FINDINGS: Gallbladder: Gallbladder is partially distended. Multiple stones are noted within. Additionally a stone is noted within the gallbladder neck. Very minimal wall thickening is noted. Negative sonographic Murphy's sign is elicited however. Common bile duct: Diameter: 3.1 mm. Liver: No focal lesion identified. Within normal limits in parenchymal echogenicity. Portal vein is patent on color  Doppler imaging with normal direction of blood flow towards the liver. Other: Right renal cyst is again noted similar to that seen  on prior CT. IMPRESSION: Cholelithiasis with a gallstone lodged in the gallbladder neck. Negative sonographic Murphy's sign is elicited however. Simple right renal cyst.  No follow-up is recommended. Electronically Signed   By: Alcide Clever M.D.   On: 04/24/2023 23:38   CT HEAD WO CONTRAST ( ) Result Date: 04/24/2023 CLINICAL DATA:  Altered mental status EXAM: CT HEAD WITHOUT CONTRAST TECHNIQUE: Contiguous axial images were obtained from the base of the skull through the vertex without intravenous contrast. RADIATION DOSE REDUCTION: This exam was performed according to the departmental dose-optimization program which includes automated exposure control, adjustment of the mA and/or kV according to patient size and/or use of iterative reconstruction technique. COMPARISON:  11/19/2022 FINDINGS: Brain: Chronic atrophic changes are noted. Additionally encephalomalacia involving the left occipital and posterior parietal lobe is seen consistent with prior infarct. Left lateral ventricle is dilated. No acute hemorrhage or acute infarct is seen. Chronic white matter ischemic changes are seen as well. Vascular: No hyperdense vessel or unexpected calcification. Skull: Normal. Negative for fracture or focal lesion. Sinuses/Orbits: No acute finding. Other: None. IMPRESSION: Chronic atrophic and encephalomalacia changes stable from the prior study. Electronically Signed   By: Alcide Clever M.D.   On: 04/24/2023 21:43   CT CHEST ABDOMEN PELVIS W CONTRAST Result Date: 04/24/2023 CLINICAL DATA:  Abdominal pain, acute, nonlocalized decubitus ulcer sacrum EXAM: CT CHEST, ABDOMEN, AND PELVIS WITH CONTRAST TECHNIQUE: Multidetector CT imaging of the chest, abdomen and pelvis was performed following the standard protocol during bolus administration of intravenous contrast. RADIATION DOSE REDUCTION: This exam was performed according to the departmental dose-optimization program which includes automated exposure control, adjustment  of the mA and/or kV according to patient size and/or use of iterative reconstruction technique. CONTRAST:  50mL OMNIPAQUE IOHEXOL 350 MG/ML SOLN COMPARISON:  CT abdomen pelvis 02/24/2023, CT chest 09/27/2017 FINDINGS: CT CHEST FINDINGS Cardiovascular: Prominent heart size. No significant pericardial effusion. The thoracic aorta is normal in caliber. Severe atherosclerotic plaque of the thoracic aorta. Four-vessel coronary artery calcifications. Mediastinum/Nodes: No enlarged mediastinal, hilar, or axillary lymph nodes. Thyroid gland, trachea, and esophagus demonstrate no significant findings. Lungs/Pleura: Emphysematous changes. Almost complete opacification of the left lung with heterogeneous consolidation. Patent left mainstem bronchi with nonvisualization of the bronchials distally. Peribronchovascular left upper lobe and dependent right lower lobe heterogeneous consolidation. No pulmonary nodule. No pulmonary mass. No pleural effusion. No pneumothorax. Musculoskeletal: No chest wall abnormality. No suspicious lytic or blastic osseous lesions. No acute displaced fracture. CT ABDOMEN PELVIS FINDINGS Hepatobiliary: Couple fluid density lesions likely representing simple hepatic cysts. Calcified gallstone noted within the gallbladder lumen. No gallbladder wall thickening or pericholecystic fluid. No biliary dilatation. Pancreas: No focal lesion. Normal pancreatic contour. No surrounding inflammatory changes. No main pancreatic ductal dilatation. Spleen: Normal in size without focal abnormality. Adrenals/Urinary Tract: No adrenal nodule bilaterally. Bilateral kidneys enhance symmetrically. Fluid density lesion within the right kidney likely represents simple renal cyst. Simple renal cysts, in the absence of clinically indicated signs/symptoms, require no independent follow-up. No hydronephrosis. No hydroureter. The urinary bladder is unremarkable. Stomach/Bowel: Stomach is within normal limits. No evidence of bowel  wall thickening or dilatation. Colonic diverticulosis. The appendix is not definitely identified with no inflammatory changes in the right lower quadrant to suggest acute appendicitis. Vascular/Lymphatic: No abdominal aorta or iliac aneurysm. Severe atherosclerotic plaque of the aorta and its branches. Severe stenosis and discontinuous  occlusion of the left iliac arteries. Distal opacification of the femoral artery is noted. No abdominal, pelvic, or inguinal lymphadenopathy. Reproductive: Question hysterectomy.  No mass. Other: No intraperitoneal free fluid. No intraperitoneal free gas. No organized fluid collection. Musculoskeletal: Sacral decubitus ulcer with complete loss of soft tissues overlying the distal sacrum and coccyx. Question vague cortical destruction of the S5 level (15:29, 2:99). No suspicious lytic or blastic osseous lesions. No acute displaced fracture. Multilevel severe degenerative changes of the spine. IMPRESSION: 1. Almost complete opacification of the left lung with heterogeneous consolidation. Patent left mainstem bronchi with nonvisualization of the bronchials distally. Peribronchovascular left upper lobe and dependent right lower lobe heterogeneous consolidation. Findings could represent combination of atelectasis versus infection-limited evaluation due to timing of contrast. 2. Sacral decubitus ulcer with complete loss of soft tissues overlying the distal sacrum and coccyx. Question vague cortical destruction of the S5 level which could represent acute osteomyelitis. Consider MR for further evaluation if clinically indicated. 3. Aortic Atherosclerosis (ICD10-I70.0) -severe, including four-vessel coronary calcification. Severe stenosis and discontinuous occlusion of the left iliac arteries. Distal opacification of the femoral artery noted. 4.Emphysema (ICD10-J43.9). Electronically Signed   By: Tish Frederickson M.D.   On: 04/24/2023 21:20   DG Chest Port 1 View Result Date:  04/24/2023 CLINICAL DATA:  Possible sepsis EXAM: PORTABLE CHEST 1 VIEW COMPARISON:  07/23/2022 FINDINGS: Cardiac shadow is mildly prominent but accentuated by the portable technique. The lungs are well aerated bilaterally. Central vascular congestion is noted. Postsurgical changes and loop recorder are again seen. No bony abnormality is noted. IMPRESSION: Mild vascular congestion. Electronically Signed   By: Alcide Clever M.D.   On: 04/24/2023 19:12    Microbiology Recent Results (from the past 240 hours)  Blood Culture (routine x 2)     Status: None (Preliminary result)   Collection Time: 04/24/23  5:56 PM   Specimen: BLOOD LEFT FOREARM  Result Value Ref Range Status   Specimen Description BLOOD LEFT FOREARM  Final   Special Requests   Final    BOTTLES DRAWN AEROBIC AND ANAEROBIC Blood Culture results may not be optimal due to an inadequate volume of blood received in culture bottles   Culture  Setup Time   Final    GRAM NEGATIVE RODS IN BOTH AEROBIC AND ANAEROBIC BOTTLES CRITICAL RESULT CALLED TO, READ BACK BY AND VERIFIED WITH: PHARMD G ABBOTT 04/25/2023 @ 0717 BY AB Performed at [facility], [address] [place], [place] [address]    Culture GRAM NEGATIVE RODS  Final   Report Status PENDING  Incomplete  Blood Culture ID Panel (Reflexed)     Status: Abnormal   Collection Time: 04/24/23  5:56 PM  Result Value Ref Range Status   Enterococcus faecalis NOT DETECTED NOT DETECTED Final   Enterococcus Faecium NOT DETECTED NOT DETECTED Final   Listeria monocytogenes NOT DETECTED NOT DETECTED Final   Staphylococcus species NOT DETECTED NOT DETECTED Final   Staphylococcus aureus (BCID) NOT DETECTED NOT DETECTED Final   Staphylococcus epidermidis NOT DETECTED NOT DETECTED Final   Staphylococcus lugdunensis NOT DETECTED NOT DETECTED Final   Streptococcus species NOT DETECTED NOT DETECTED Final   Streptococcus agalactiae NOT DETECTED NOT DETECTED Final   Streptococcus pneumoniae NOT  DETECTED NOT DETECTED Final   Streptococcus pyogenes NOT DETECTED NOT DETECTED Final   A.calcoaceticus-baumannii NOT DETECTED NOT DETECTED Final   Bacteroides fragilis NOT DETECTED NOT DETECTED Final   Enterobacterales DETECTED (A) NOT DETECTED Final    Comment: Enterobacterales represent  a large order of gram negative bacteria, not a single organism. CRITICAL RESULT CALLED TO, READ BACK BY AND VERIFIED WITH: PHARMD G ABBOTT 04/25/2023 @ 0717 BY AB    Enterobacter cloacae complex NOT DETECTED NOT DETECTED Final   Escherichia coli NOT DETECTED NOT DETECTED Final   Klebsiella aerogenes NOT DETECTED NOT DETECTED Final   Klebsiella oxytoca NOT DETECTED NOT DETECTED Final   Klebsiella pneumoniae DETECTED (A) NOT DETECTED Final    Comment: CRITICAL RESULT CALLED TO, READ BACK BY AND VERIFIED WITH: PHARMD G ABBOTT 04/25/2023 @ 0717 BY AB    Proteus species NOT DETECTED NOT DETECTED Final   Salmonella species NOT DETECTED NOT DETECTED Final   Serratia marcescens NOT DETECTED NOT DETECTED Final   Haemophilus influenzae NOT DETECTED NOT DETECTED Final   Neisseria meningitidis NOT DETECTED NOT DETECTED Final   Pseudomonas aeruginosa NOT DETECTED NOT DETECTED Final   Stenotrophomonas maltophilia NOT DETECTED NOT DETECTED Final   Candida albicans NOT DETECTED NOT DETECTED Final   Candida auris NOT DETECTED NOT DETECTED Final   Candida glabrata NOT DETECTED NOT DETECTED Final   Candida krusei NOT DETECTED NOT DETECTED Final   Candida parapsilosis NOT DETECTED NOT DETECTED Final   Candida tropicalis NOT DETECTED NOT DETECTED Final   Cryptococcus neoformans/gattii NOT DETECTED NOT DETECTED Final   CTX-M ESBL NOT DETECTED NOT DETECTED Final   Carbapenem resistance IMP NOT DETECTED NOT DETECTED Final   Carbapenem resistance KPC NOT DETECTED NOT DETECTED Final   Carbapenem resistance NDM NOT DETECTED NOT DETECTED Final   Carbapenem resist OXA 48 LIKE NOT DETECTED NOT DETECTED Final   Carbapenem  resistance VIM NOT DETECTED NOT DETECTED Final    Comment: Performed at [facility], [address] [place], [place] [address]  Resp panel by RT-PCR (RSV, Flu A&B, Covid) Anterior Nasal Swab     Status: None   Collection Time: 04/24/23  6:58 PM   Specimen: Anterior Nasal Swab  Result Value Ref Range Status   SARS Coronavirus 2 by RT PCR NEGATIVE NEGATIVE Final   Influenza A by PCR NEGATIVE NEGATIVE Final   Influenza B by PCR NEGATIVE NEGATIVE Final    Comment: (NOTE) The Xpert Xpress SARS-CoV-2/FLU/RSV plus assay is intended as an aid in the diagnosis of influenza from Nasopharyngeal swab specimens and should not be used as a sole basis for treatment. Nasal washings and aspirates are unacceptable for Xpert Xpress SARS-CoV-2/FLU/RSV testing.  Fact Sheet for Patients: BloggerCourse.com  Fact Sheet for Healthcare Providers: SeriousBroker.it  This test is not yet approved or cleared by the [place] FDA and has been authorized for detection and/or diagnosis of SARS-CoV-2 by FDA under an Emergency Use Authorization (EUA). This EUA will remain in effect (meaning this test can be used) for the duration of the COVID-19 declaration under Section 564(b)(1) of the Act, 21 U.S.C. section 360bbb-3(b)(1), unless the authorization is terminated or revoked.     Resp Syncytial Virus by PCR NEGATIVE NEGATIVE Final    Comment: (NOTE) Fact Sheet for Patients: BloggerCourse.com  Fact Sheet for Healthcare Providers: SeriousBroker.it  This test is not yet approved or cleared by the [place] FDA and has been authorized for detection and/or diagnosis of SARS-CoV-2 by FDA under an Emergency Use Authorization (EUA). This EUA will remain in effect (meaning this test can be used) for the duration of the COVID-19 declaration under Section 564(b)(1) of the Act, 21 U.S.C. section  360bbb-3(b)(1), unless the authorization is terminated or revoked.  Performed at  [facility], [address], [place], [place] [address]   MRSA Next Gen by PCR, Nasal     Status: Abnormal   Collection Time: 04/24/23  9:30 PM  Result Value Ref Range Status   MRSA by PCR Next Gen DETECTED (A) NOT DETECTED Final    Comment: RESULT CALLED TO, READ BACK BY AND VERIFIED WITH: G BLUST RN 04/25/2023 @ 0051 BY AB (NOTE) The GeneXpert MRSA Assay (FDA approved for NASAL specimens only), is one component of a comprehensive MRSA colonization surveillance program. It is not intended to diagnose MRSA infection nor to guide or monitor treatment for MRSA infections. Test performance is not FDA approved in patients less than 106 years old. Performed at [facility], [address], [place], [place] [address]   Blood Culture (routine x 2)     Status: None (Preliminary result)   Collection Time: 04/24/23 10:21 PM   Specimen: BLOOD  Result Value Ref Range Status   Specimen Description BLOOD BLOOD RIGHT HAND  Final   Special Requests   Final    BOTTLES DRAWN AEROBIC ONLY Blood Culture results may not be optimal due to an inadequate volume of blood received in culture bottles   Culture   Final    NO GROWTH < 12 HOURS Performed at [facility], [address], [place], [place] [address]    Report Status PENDING  Incomplete  Respiratory (~20 pathogens) panel by PCR     Status: None   Collection Time: 04/24/23 11:11 PM   Specimen: Nasopharyngeal Swab; Respiratory  Result Value Ref Range Status   Adenovirus NOT DETECTED NOT DETECTED Final   Coronavirus 229E NOT DETECTED NOT DETECTED Final    Comment: (NOTE) The Coronavirus on the Respiratory Panel, DOES NOT test for the novel  Coronavirus (2019 nCoV)    Coronavirus HKU1 NOT DETECTED NOT DETECTED Final   Coronavirus NL63 NOT DETECTED NOT DETECTED Final   Coronavirus OC43 NOT DETECTED NOT DETECTED Final   Metapneumovirus NOT  DETECTED NOT DETECTED Final   Rhinovirus / Enterovirus NOT DETECTED NOT DETECTED Final   Influenza A NOT DETECTED NOT DETECTED Final   Influenza B NOT DETECTED NOT DETECTED Final   Parainfluenza Virus 1 NOT DETECTED NOT DETECTED Final   Parainfluenza Virus 2 NOT DETECTED NOT DETECTED Final   Parainfluenza Virus 3 NOT DETECTED NOT DETECTED Final   Parainfluenza Virus 4 NOT DETECTED NOT DETECTED Final   Respiratory Syncytial Virus NOT DETECTED NOT DETECTED Final   Bordetella pertussis NOT DETECTED NOT DETECTED Final   Bordetella Parapertussis NOT DETECTED NOT DETECTED Final   Chlamydophila pneumoniae NOT DETECTED NOT DETECTED Final   Mycoplasma pneumoniae NOT DETECTED NOT DETECTED Final    Comment: Performed at [facility], [address], [place], [place] [address]    Lab Basic Metabolic Panel: Recent Labs  Lab 04/24/23 1756 04/24/23 1759 04/24/23 2106 04/24/23 2221 04/25/23 0640  NA 143 146* 146* 143 140  K 4.3 2.8* 2.7* 2.9* 3.8  CL 112*  --   --  114* 114*  CO2 20*  --   --  16* 16*  GLUCOSE 54*  --   --  104* 133*  BUN 39*  --   --  35* 35*  CREATININE 1.52*  --   --  1.35* 1.17*  CALCIUM 8.0*  --   --  7.6* 7.7*  MG 1.9  --   --  1.6* 2.8*  PHOS  --   --   --  2.7 1.9*   Liver Function Tests: Recent Labs  Lab 04/24/23 1756 04/25/23 0640  AST 101* 125*  ALT 73* 72*  ALKPHOS 46 37*  BILITOT 1.8* 1.0  PROT 6.0* 4.8*  ALBUMIN 1.8* 1.5*   No results for input(s): "LIPASE", "AMYLASE" in the last 168 hours. Recent Labs  Lab 04/24/23 2221  AMMONIA 22   CBC: Recent Labs  Lab 04/24/23 1756 04/24/23 1759 04/24/23 2106 04/25/23 0319 04/25/23 0640  WBC 8.0  --   --  2.5* 5.6  NEUTROABS 7.5  --   --   --   --   HGB 10.4* 9.2* 8.2* 2.8* 9.5*  HCT 33.1* 27.0* 24.0* 9.3* 28.9*  MCV 87.8  --   --  92.1 84.3  PLT 347  --   --  76* 229   Cardiac Enzymes: No results for input(s): "CKTOTAL", "CKMB", "CKMBINDEX", "TROPONINI" in the last 168 hours. Sepsis  Labs: Recent Labs  Lab 04/24/23 1756 04/24/23 1806 04/24/23 2221 04/25/23 0028 04/25/23 0319 04/25/23 0640  PROCALCITON  --   --  2.29  --   --  3.19  WBC 8.0  --   --   --  2.5* 5.6  LATICACIDVEN  --  3.4* 5.2* 5.8*  --   --     Procedures/ Operations  none   Steffanie Dunn 04/08/2023, 9:42 AM

## 2023-05-03 NOTE — Progress Notes (Signed)
 Pt with no heart tones, and without spontaneous chest rise per auscultation. Heart rhythm asystole. Verified with 2nd RN Kem Boroughs RN. Time of death 67.

## 2023-05-03 DEATH — deceased
